# Patient Record
Sex: Female | Born: 1955 | Race: Black or African American | Hispanic: No | Marital: Married | Smoking: Former smoker
Health system: Southern US, Community
[De-identification: ages and names within clinical notes are randomized; demographics above are authoritative.]

## PROBLEM LIST (undated history)

## (undated) DIAGNOSIS — G0491 Myelitis, unspecified: Secondary | ICD-10-CM

## (undated) DIAGNOSIS — Z8489 Family history of other specified conditions: Secondary | ICD-10-CM

## (undated) DIAGNOSIS — C9000 Multiple myeloma not having achieved remission: Secondary | ICD-10-CM

## (undated) DIAGNOSIS — T4145XA Adverse effect of unspecified anesthetic, initial encounter: Secondary | ICD-10-CM

## (undated) DIAGNOSIS — G709 Myoneural disorder, unspecified: Secondary | ICD-10-CM

## (undated) DIAGNOSIS — Z9889 Other specified postprocedural states: Secondary | ICD-10-CM

## (undated) DIAGNOSIS — D472 Monoclonal gammopathy: Principal | ICD-10-CM

## (undated) DIAGNOSIS — K5900 Constipation, unspecified: Secondary | ICD-10-CM

## (undated) DIAGNOSIS — K589 Irritable bowel syndrome without diarrhea: Secondary | ICD-10-CM

## (undated) DIAGNOSIS — M129 Arthropathy, unspecified: Secondary | ICD-10-CM

## (undated) DIAGNOSIS — K909 Intestinal malabsorption, unspecified: Secondary | ICD-10-CM

## (undated) DIAGNOSIS — R112 Nausea with vomiting, unspecified: Secondary | ICD-10-CM

## (undated) DIAGNOSIS — D500 Iron deficiency anemia secondary to blood loss (chronic): Secondary | ICD-10-CM

## (undated) DIAGNOSIS — K219 Gastro-esophageal reflux disease without esophagitis: Secondary | ICD-10-CM

## (undated) DIAGNOSIS — M353 Polymyalgia rheumatica: Secondary | ICD-10-CM

## (undated) DIAGNOSIS — R011 Cardiac murmur, unspecified: Secondary | ICD-10-CM

## (undated) DIAGNOSIS — D219 Benign neoplasm of connective and other soft tissue, unspecified: Secondary | ICD-10-CM

## (undated) DIAGNOSIS — M1990 Unspecified osteoarthritis, unspecified site: Secondary | ICD-10-CM

## (undated) DIAGNOSIS — D649 Anemia, unspecified: Secondary | ICD-10-CM

## (undated) DIAGNOSIS — T8859XA Other complications of anesthesia, initial encounter: Secondary | ICD-10-CM

## (undated) HISTORY — PX: OTHER SURGICAL HISTORY: SHX169

## (undated) HISTORY — DX: Myoneural disorder, unspecified: G70.9

## (undated) HISTORY — DX: Benign neoplasm of connective and other soft tissue, unspecified: D21.9

## (undated) HISTORY — PX: KNEE ARTHROSCOPY: SUR90

## (undated) HISTORY — PX: COLONOSCOPY: SHX174

## (undated) HISTORY — DX: Intestinal malabsorption, unspecified: K90.9

## (undated) HISTORY — DX: Gastro-esophageal reflux disease without esophagitis: K21.9

## (undated) HISTORY — DX: Monoclonal gammopathy: D47.2

## (undated) HISTORY — DX: Arthropathy, unspecified: M12.9

## (undated) HISTORY — DX: Irritable bowel syndrome, unspecified: K58.9

## (undated) HISTORY — DX: Anemia, unspecified: D64.9

## (undated) HISTORY — DX: Iron deficiency anemia secondary to blood loss (chronic): D50.0

## (undated) HISTORY — PX: UMBILICAL HERNIA REPAIR: SHX196

## (undated) HISTORY — PX: TUBAL LIGATION: SHX77

## (undated) HISTORY — DX: Multiple myeloma not having achieved remission: C90.00

## (undated) HISTORY — DX: Polymyalgia rheumatica: M35.3

## (undated) HISTORY — DX: Unspecified osteoarthritis, unspecified site: M19.90

## (undated) HISTORY — DX: Cardiac murmur, unspecified: R01.1

---

## 1999-03-30 ENCOUNTER — Other Ambulatory Visit: Admission: RE | Admit: 1999-03-30 | Discharge: 1999-03-30 | Payer: Self-pay | Admitting: Obstetrics and Gynecology

## 2000-06-01 ENCOUNTER — Other Ambulatory Visit: Admission: RE | Admit: 2000-06-01 | Discharge: 2000-06-01 | Payer: Self-pay | Admitting: Obstetrics and Gynecology

## 2001-09-28 ENCOUNTER — Encounter: Payer: Self-pay | Admitting: Family Medicine

## 2001-09-28 ENCOUNTER — Encounter: Admission: RE | Admit: 2001-09-28 | Discharge: 2001-09-28 | Payer: Self-pay | Admitting: Family Medicine

## 2001-12-27 ENCOUNTER — Other Ambulatory Visit: Admission: RE | Admit: 2001-12-27 | Discharge: 2001-12-27 | Payer: Self-pay | Admitting: Obstetrics and Gynecology

## 2002-09-20 ENCOUNTER — Encounter: Admission: RE | Admit: 2002-09-20 | Discharge: 2002-09-20 | Payer: Self-pay | Admitting: Family Medicine

## 2002-09-20 ENCOUNTER — Encounter: Payer: Self-pay | Admitting: Family Medicine

## 2003-04-09 ENCOUNTER — Other Ambulatory Visit: Admission: RE | Admit: 2003-04-09 | Discharge: 2003-04-09 | Payer: Self-pay | Admitting: Obstetrics and Gynecology

## 2004-02-01 ENCOUNTER — Emergency Department (HOSPITAL_COMMUNITY): Admission: EM | Admit: 2004-02-01 | Discharge: 2004-02-02 | Payer: Self-pay | Admitting: Emergency Medicine

## 2004-08-28 ENCOUNTER — Other Ambulatory Visit: Admission: RE | Admit: 2004-08-28 | Discharge: 2004-08-28 | Payer: Self-pay | Admitting: Obstetrics and Gynecology

## 2004-09-04 ENCOUNTER — Encounter: Admission: RE | Admit: 2004-09-04 | Discharge: 2004-09-04 | Payer: Self-pay | Admitting: Family Medicine

## 2004-10-30 ENCOUNTER — Encounter: Admission: RE | Admit: 2004-10-30 | Discharge: 2004-10-30 | Payer: Self-pay | Admitting: Neurology

## 2004-12-01 ENCOUNTER — Encounter: Admission: RE | Admit: 2004-12-01 | Discharge: 2004-12-01 | Payer: Self-pay | Admitting: Family Medicine

## 2004-12-18 ENCOUNTER — Encounter: Admission: RE | Admit: 2004-12-18 | Discharge: 2004-12-18 | Payer: Self-pay | Admitting: Neurology

## 2005-01-14 ENCOUNTER — Ambulatory Visit: Payer: Self-pay | Admitting: Hematology & Oncology

## 2005-02-02 ENCOUNTER — Ambulatory Visit: Payer: Self-pay | Admitting: Hematology & Oncology

## 2005-02-02 ENCOUNTER — Ambulatory Visit (HOSPITAL_COMMUNITY): Admission: RE | Admit: 2005-02-02 | Discharge: 2005-02-02 | Payer: Self-pay | Admitting: Hematology & Oncology

## 2005-02-02 ENCOUNTER — Encounter (INDEPENDENT_AMBULATORY_CARE_PROVIDER_SITE_OTHER): Payer: Self-pay | Admitting: *Deleted

## 2005-04-15 ENCOUNTER — Ambulatory Visit: Payer: Self-pay | Admitting: Hematology & Oncology

## 2005-06-05 ENCOUNTER — Emergency Department (HOSPITAL_COMMUNITY): Admission: EM | Admit: 2005-06-05 | Discharge: 2005-06-05 | Payer: Self-pay | Admitting: Emergency Medicine

## 2005-06-09 ENCOUNTER — Encounter: Admission: RE | Admit: 2005-06-09 | Discharge: 2005-06-09 | Payer: Self-pay | Admitting: Family Medicine

## 2005-10-07 ENCOUNTER — Ambulatory Visit: Payer: Self-pay | Admitting: Gastroenterology

## 2005-10-22 ENCOUNTER — Ambulatory Visit: Payer: Self-pay | Admitting: Hematology & Oncology

## 2005-10-26 LAB — CBC WITH DIFFERENTIAL/PLATELET
Eosinophils Absolute: 0.2 10*3/uL (ref 0.0–0.5)
MCV: 88.7 fL (ref 81.0–101.0)
MONO%: 9.2 % (ref 0.0–13.0)
NEUT#: 2.5 10*3/uL (ref 1.5–6.5)
RBC: 3.92 10*6/uL (ref 3.70–5.32)
RDW: 13.5 % (ref 11.3–14.5)
WBC: 5.8 10*3/uL (ref 3.9–10.0)
lymph#: 2.6 10*3/uL (ref 0.9–3.3)

## 2005-10-27 LAB — PROTEIN ELECTROPHORESIS, SERUM
Albumin ELP: 52.1 % — ABNORMAL LOW (ref 55.8–66.1)
Alpha-1-Globulin: 3.4 % (ref 2.9–4.9)
Beta Globulin: 4.4 % — ABNORMAL LOW (ref 4.7–7.2)
Total Protein, Serum Electrophoresis: 8.4 g/dL — ABNORMAL HIGH (ref 6.0–8.3)

## 2005-11-30 ENCOUNTER — Ambulatory Visit: Payer: Self-pay | Admitting: Gastroenterology

## 2006-01-28 ENCOUNTER — Other Ambulatory Visit: Admission: RE | Admit: 2006-01-28 | Discharge: 2006-01-28 | Payer: Self-pay | Admitting: Obstetrics and Gynecology

## 2006-06-27 ENCOUNTER — Ambulatory Visit: Payer: Self-pay | Admitting: Gastroenterology

## 2006-06-28 ENCOUNTER — Encounter: Admission: RE | Admit: 2006-06-28 | Discharge: 2006-06-28 | Payer: Self-pay | Admitting: Physician Assistant

## 2007-02-24 ENCOUNTER — Other Ambulatory Visit: Admission: RE | Admit: 2007-02-24 | Discharge: 2007-02-24 | Payer: Self-pay | Admitting: Obstetrics and Gynecology

## 2007-03-01 ENCOUNTER — Encounter: Admission: RE | Admit: 2007-03-01 | Discharge: 2007-03-01 | Payer: Self-pay | Admitting: Emergency Medicine

## 2008-02-21 ENCOUNTER — Encounter: Admission: RE | Admit: 2008-02-21 | Discharge: 2008-02-21 | Payer: Self-pay | Admitting: Emergency Medicine

## 2008-02-29 ENCOUNTER — Encounter: Admission: RE | Admit: 2008-02-29 | Discharge: 2008-02-29 | Payer: Self-pay | Admitting: Emergency Medicine

## 2008-03-11 ENCOUNTER — Encounter: Payer: Self-pay | Admitting: Obstetrics and Gynecology

## 2008-03-11 ENCOUNTER — Ambulatory Visit: Payer: Self-pay | Admitting: Obstetrics and Gynecology

## 2008-03-11 ENCOUNTER — Other Ambulatory Visit: Admission: RE | Admit: 2008-03-11 | Discharge: 2008-03-11 | Payer: Self-pay | Admitting: Obstetrics and Gynecology

## 2008-03-21 ENCOUNTER — Encounter: Admission: RE | Admit: 2008-03-21 | Discharge: 2008-03-21 | Payer: Self-pay | Admitting: Emergency Medicine

## 2008-04-09 DIAGNOSIS — Z8719 Personal history of other diseases of the digestive system: Secondary | ICD-10-CM | POA: Insufficient documentation

## 2008-04-09 DIAGNOSIS — Z9189 Other specified personal risk factors, not elsewhere classified: Secondary | ICD-10-CM | POA: Insufficient documentation

## 2008-04-09 DIAGNOSIS — M129 Arthropathy, unspecified: Secondary | ICD-10-CM | POA: Insufficient documentation

## 2008-04-10 ENCOUNTER — Ambulatory Visit: Payer: Self-pay | Admitting: Gastroenterology

## 2009-05-10 DIAGNOSIS — G0491 Myelitis, unspecified: Secondary | ICD-10-CM

## 2009-05-10 HISTORY — PX: NECK SURGERY: SHX720

## 2009-05-10 HISTORY — DX: Myelitis, unspecified: G04.91

## 2009-05-23 ENCOUNTER — Ambulatory Visit: Payer: Self-pay | Admitting: Obstetrics and Gynecology

## 2009-05-23 ENCOUNTER — Other Ambulatory Visit: Admission: RE | Admit: 2009-05-23 | Discharge: 2009-05-23 | Payer: Self-pay | Admitting: Obstetrics and Gynecology

## 2009-06-13 ENCOUNTER — Ambulatory Visit: Payer: Self-pay | Admitting: Obstetrics and Gynecology

## 2009-09-24 ENCOUNTER — Ambulatory Visit: Payer: Self-pay | Admitting: Hematology & Oncology

## 2009-10-02 ENCOUNTER — Ambulatory Visit: Payer: Self-pay | Admitting: Diagnostic Radiology

## 2009-10-02 ENCOUNTER — Ambulatory Visit (HOSPITAL_BASED_OUTPATIENT_CLINIC_OR_DEPARTMENT_OTHER): Admission: RE | Admit: 2009-10-02 | Discharge: 2009-10-02 | Payer: Self-pay | Admitting: Hematology & Oncology

## 2009-10-02 LAB — CBC WITH DIFFERENTIAL (CANCER CENTER ONLY)
BASO#: 0 10*3/uL (ref 0.0–0.2)
BASO%: 0.4 % (ref 0.0–2.0)
MCHC: 33.2 g/dL (ref 32.0–36.0)
MCV: 89 fL (ref 81–101)
MONO#: 0.2 10*3/uL (ref 0.1–0.9)
NEUT#: 2.3 10*3/uL (ref 1.5–6.5)
Platelets: 190 10*3/uL (ref 145–400)

## 2009-10-07 LAB — COMPREHENSIVE METABOLIC PANEL
ALT: 15 U/L (ref 0–35)
AST: 17 U/L (ref 0–37)
Alkaline Phosphatase: 42 U/L (ref 39–117)
CO2: 24 mEq/L (ref 19–32)
Calcium: 9.3 mg/dL (ref 8.4–10.5)
Glucose, Bld: 91 mg/dL (ref 70–99)
Sodium: 136 mEq/L (ref 135–145)
Total Bilirubin: 0.4 mg/dL (ref 0.3–1.2)
Total Protein: 8 g/dL (ref 6.0–8.3)

## 2009-10-07 LAB — SPEP & IFE WITH QIG
Albumin ELP: 49.9 % — ABNORMAL LOW (ref 55.8–66.1)
Alpha-1-Globulin: 3.7 % (ref 2.9–4.9)
Alpha-2-Globulin: 8.4 % (ref 7.1–11.8)
Beta Globulin: 4.3 % — ABNORMAL LOW (ref 4.7–7.2)
IgA: 32 mg/dL — ABNORMAL LOW (ref 68–378)
IgM, Serum: 25 mg/dL — ABNORMAL LOW (ref 60–263)
Total Protein, Serum Electrophoresis: 8 g/dL (ref 6.0–8.3)

## 2009-10-07 LAB — RETICULOCYTES (CHCC)
ABS Retic: 15 10*3/uL — ABNORMAL LOW (ref 19.0–186.0)
Retic Ct Pct: 0.4 % (ref 0.4–3.1)

## 2009-10-07 LAB — LACTATE DEHYDROGENASE: LDH: 171 U/L (ref 94–250)

## 2009-10-07 LAB — KAPPA/LAMBDA LIGHT CHAINS: Kappa:Lambda Ratio: 4.31 — ABNORMAL HIGH (ref 0.26–1.65)

## 2009-10-09 LAB — UIFE/LIGHT CHAINS/TP QN, 24-HR UR
Alpha 2, Urine: DETECTED — AB
Beta, Urine: DETECTED — AB
Free Kappa Lt Chains,Ur: 0.8 mg/dL (ref 0.04–1.51)
Free Lt Chn Excr Rate: 7.2 mg/d
Total Protein, Urine: 1.3 mg/dL

## 2009-12-16 ENCOUNTER — Ambulatory Visit: Payer: Self-pay | Admitting: Gastroenterology

## 2009-12-16 DIAGNOSIS — R1032 Left lower quadrant pain: Secondary | ICD-10-CM | POA: Insufficient documentation

## 2010-02-05 ENCOUNTER — Ambulatory Visit: Payer: Self-pay | Admitting: Hematology & Oncology

## 2010-02-06 LAB — CBC WITH DIFFERENTIAL (CANCER CENTER ONLY)
BASO#: 0 10*3/uL (ref 0.0–0.2)
BASO%: 0.3 % (ref 0.0–2.0)
EOS%: 1.5 % (ref 0.0–7.0)
HGB: 11.3 g/dL — ABNORMAL LOW (ref 11.6–15.9)
MCH: 29.4 pg (ref 26.0–34.0)
MCHC: 32.9 g/dL (ref 32.0–36.0)
MCV: 89 fL (ref 81–101)
MONO#: 0.3 10*3/uL (ref 0.1–0.9)
Platelets: 179 10*3/uL (ref 145–400)

## 2010-02-06 LAB — CHCC SATELLITE - SMEAR

## 2010-02-12 LAB — COMPREHENSIVE METABOLIC PANEL
Albumin: 3.8 g/dL (ref 3.5–5.2)
CO2: 28 mEq/L (ref 19–32)
Creatinine, Ser: 1 mg/dL (ref 0.40–1.20)
Potassium: 3.7 mEq/L (ref 3.5–5.3)
Sodium: 139 mEq/L (ref 135–145)
Total Protein: 7.9 g/dL (ref 6.0–8.3)

## 2010-02-12 LAB — PROTEIN ELECTROPHORESIS, SERUM
Beta 2: 2.9 % — ABNORMAL LOW (ref 3.2–6.5)
Beta Globulin: 4.7 % (ref 4.7–7.2)
Gamma Globulin: 29.7 % — ABNORMAL HIGH (ref 11.1–18.8)

## 2010-02-12 LAB — KAPPA/LAMBDA LIGHT CHAINS
Kappa:Lambda Ratio: 5.46 — ABNORMAL HIGH (ref 0.26–1.65)
Lambda Free Lght Chn: 0.5 mg/dL — ABNORMAL LOW (ref 0.57–2.63)

## 2010-04-10 ENCOUNTER — Encounter: Admission: RE | Admit: 2010-04-10 | Discharge: 2010-04-10 | Payer: Self-pay | Admitting: Orthopedic Surgery

## 2010-05-31 ENCOUNTER — Encounter: Payer: Self-pay | Admitting: Neurology

## 2010-06-08 ENCOUNTER — Ambulatory Visit (HOSPITAL_BASED_OUTPATIENT_CLINIC_OR_DEPARTMENT_OTHER): Payer: 59 | Admitting: Hematology & Oncology

## 2010-06-11 NOTE — Assessment & Plan Note (Signed)
Summary: more frequent bm's...as.   History of Present Illness Visit Type: Mrs.  Primary GI MD: Melvia Heaps MD [facility] Primary Provider: Roque Lias, MD Requesting Provider: na Chief Complaint: More frequent BMs, and pain and hardness on left side  History of Present Illness:   Barbara Cook has returned complaining of lower abdominal discomfort. Preceding a bowel movement she develops tightness in the left lower quadrant.  Her bowels are initially incomplete.  Following the bowel movement she has persistent discomfort in the left lower quadrant.  She took a fiber in the past but did not tolerate it.  She denies melena or hematochezia.  Colonoscopy in 2007 was normal.   GI Review of Systems    Reports abdominal pain.     Location of  Abdominal pain: left side.    Denies acid reflux, belching, bloating, chest pain, dysphagia with liquids, dysphagia with solids, heartburn, loss of appetite, nausea, vomiting, vomiting blood, weight loss, and  weight gain.        Denies anal fissure, black tarry stools, change in bowel habit, constipation, diarrhea, diverticulosis, fecal incontinence, heme positive stool, hemorrhoids, irritable bowel syndrome, jaundice, light color stool, liver problems, rectal bleeding, and  rectal pain.    Current Medications (verified): 1)  Meloxicam 15 Mg Tabs (Meloxicam) .... As Needed 2)  Torsemide 20 Mg Tabs (Torsemide) .... One Tablet By Mouth Once Daily 3)  Vitamin D 1000 Unit Tabs (Cholecalciferol) .... Four Tablets By Mouth Once Daily 4)  Omega-3 350 Mg Caps (Omega-3 Fatty Acids) .... One Capsule By Mouth Two Times A Day 5)  Pepcid Ac Maximum Strength 20 Mg Tabs (Famotidine) .... As Needed 6)  Tylenol Arthritis Pain 650 Mg Cr-Tabs (Acetaminophen) .... Two Tablets By Mouth Once Daily  Allergies (verified): 1)  ! Codeine  Past History:  Past Medical History: Reviewed history from 04/09/2008 and no changes required. Current Problems:  ABDOMINAL PAIN,  HX OF (ICD-V15.89) CONSTIPATION, HX OF (ICD-V12.79) ARTHRITIS (ICD-716.90)  Past Surgical History: Reviewed history from 04/09/2008 and no changes required. Hernia Surgery Tubal Ligation  Family History: Lung Cancer: Uncle Throat Cancer: Aunt Family History of Diabetes: Maternal Grandmother Family History of Heart Disease: Paternal Grandmother No FH of Colon Cancer:  Social History: [employer] Patient is a former smoker.  Alcohol Use - no Illicit Drug Use - no Patient gets regular exercise.  Review of Systems       The patient complains of allergy/sinus, arthritis/joint pain, headaches-new, muscle pains/cramps, night sweats, and swelling of feet/legs.  The patient denies anemia, anxiety-new, back pain, blood in urine, breast changes/lumps, change in vision, confusion, cough, coughing up blood, depression-new, fainting, fatigue, fever, hearing problems, heart murmur, heart rhythm changes, itching, menstrual pain, nosebleeds, pregnancy symptoms, shortness of breath, skin rash, sleeping problems, sore throat, swollen lymph glands, thirst - excessive, urination - excessive, urination changes/pain, urine leakage, vision changes, and voice change.         All other systems were reviewed and were negative   Vital Signs:  Patient profile:   55 year old female Height:      64 inches Weight:      191 pounds BMI:     32.90 BSA:     1.92 Pulse rate:   88 / minute Pulse rhythm:   regular BP sitting:   124 / 76  (left arm) Cuff size:   regular  Vitals Entered By: Ok Anis CMA (December 16, 2009 8:10 AM)  Physical Exam  Additional Exam:  On physical exam she is a well-developed well-nourished female  skin: anicteric HEENT: normocephalic; PEERLA; no nasal or pharyngeal abnormalities neck: supple nodes: no cervical lymphadenopathy chest: clear to ausculatation and percussion heart: no murmurs, gallops, or rubs abd: soft, nontender; BS normoactive; no abdominal masses,  tenderness, organomegaly rectal: deferred ext: no cynanosis, clubbing; there is 1+ ankle edema skeletal: no deformities neuro: oriented x 3; no focal abnormalities    Impression & Recommendations:  Problem # 1:  ABDOMINAL PAIN, LEFT LOWER QUADRANT (ICD-789.04) Pain is likely related to mild constipation and is due to spasm.  Recommendations #1 fiber supplementation #2 hyomax p.r.n.  Patient Instructions: 1)  Copy sent to : Roque Lias, MD 2)  You can pick up your new prescription from your pharmacy today 3)  The medication list was reviewed and reconciled.  All changed / newly prescribed medications were explained.  A complete medication list was provided to the patient / caregiver. Prescriptions: HYOMAX-SL 0.125 MG SUBL (HYOSCYAMINE SULFATE) take 2 tablets sublingual q.4 H.  #15 x 2   Entered and Authorized by:   Louis Meckel MD   Signed by:   Louis Meckel MD on 12/16/2009   Method used:   Electronically to        [facility] [address] [phone]* (retail)       [address]       [place], [place]  [address]       Ph: [phone] or [phone]       Fax: [phone]   RxID:   [id number]

## 2010-06-12 ENCOUNTER — Encounter (HOSPITAL_BASED_OUTPATIENT_CLINIC_OR_DEPARTMENT_OTHER): Payer: 59 | Admitting: Hematology & Oncology

## 2010-06-12 DIAGNOSIS — D472 Monoclonal gammopathy: Secondary | ICD-10-CM

## 2010-06-12 DIAGNOSIS — D649 Anemia, unspecified: Secondary | ICD-10-CM

## 2010-06-12 LAB — CBC WITH DIFFERENTIAL (CANCER CENTER ONLY)
BASO%: 0.4 % (ref 0.0–2.0)
EOS%: 1.7 % (ref 0.0–7.0)
HCT: 32.2 % — ABNORMAL LOW (ref 34.8–46.6)
HGB: 10.5 g/dL — ABNORMAL LOW (ref 11.6–15.9)
LYMPH%: 42.1 % (ref 14.0–48.0)
MCH: 28.6 pg (ref 26.0–34.0)
MCHC: 32.5 g/dL (ref 32.0–36.0)
MCV: 88 fL (ref 81–101)
MONO#: 0.4 10*3/uL (ref 0.1–0.9)
NEUT%: 48.3 % (ref 39.6–80.0)

## 2010-06-16 LAB — SPEP & IFE WITH QIG
Albumin ELP: 48.6 % — ABNORMAL LOW (ref 55.8–66.1)
Beta 2: 3.2 % (ref 3.2–6.5)
IgA: 47 mg/dL — ABNORMAL LOW (ref 68–378)
IgG (Immunoglobin G), Serum: 3490 mg/dL — ABNORMAL HIGH (ref 694–1618)
IgM, Serum: 26 mg/dL — ABNORMAL LOW (ref 60–263)
Total Protein, Serum Electrophoresis: 8.8 g/dL — ABNORMAL HIGH (ref 6.0–8.3)

## 2010-06-16 LAB — COMPREHENSIVE METABOLIC PANEL
AST: 25 U/L (ref 0–37)
Albumin: 4.1 g/dL (ref 3.5–5.2)
Alkaline Phosphatase: 49 U/L (ref 39–117)
BUN: 26 mg/dL — ABNORMAL HIGH (ref 6–23)
CO2: 23 mEq/L (ref 19–32)
Sodium: 135 mEq/L (ref 135–145)

## 2010-06-16 LAB — KAPPA/LAMBDA LIGHT CHAINS: Kappa:Lambda Ratio: 3.79 — ABNORMAL HIGH (ref 0.26–1.65)

## 2010-06-24 ENCOUNTER — Other Ambulatory Visit: Payer: Self-pay | Admitting: Orthopedic Surgery

## 2010-06-24 DIAGNOSIS — M542 Cervicalgia: Secondary | ICD-10-CM

## 2010-07-13 ENCOUNTER — Ambulatory Visit
Admission: RE | Admit: 2010-07-13 | Discharge: 2010-07-13 | Disposition: A | Discharge status: Home / Self Care | Payer: 59 | Source: Ambulatory Visit | Attending: Orthopedic Surgery | Admitting: Orthopedic Surgery

## 2010-07-13 DIAGNOSIS — M542 Cervicalgia: Secondary | ICD-10-CM

## 2010-08-17 ENCOUNTER — Encounter (HOSPITAL_BASED_OUTPATIENT_CLINIC_OR_DEPARTMENT_OTHER): Payer: 59 | Admitting: Hematology & Oncology

## 2010-08-17 ENCOUNTER — Other Ambulatory Visit: Payer: Self-pay | Admitting: Hematology & Oncology

## 2010-08-17 DIAGNOSIS — D472 Monoclonal gammopathy: Secondary | ICD-10-CM

## 2010-08-17 LAB — CBC WITH DIFFERENTIAL (CANCER CENTER ONLY)
BASO#: 0 10*3/uL (ref 0.0–0.2)
EOS%: 0.1 % (ref 0.0–7.0)
HCT: 34.7 % — ABNORMAL LOW (ref 34.8–46.6)
HGB: 11.8 g/dL (ref 11.6–15.9)
MCH: 28.4 pg (ref 26.0–34.0)
MCHC: 34 g/dL (ref 32.0–36.0)
MONO%: 5.3 % (ref 0.0–13.0)
NEUT%: 79.1 % (ref 39.6–80.0)

## 2010-08-19 LAB — SPEP & IFE WITH QIG
Alpha-2-Globulin: 8.9 % (ref 7.1–11.8)
IgG (Immunoglobin G), Serum: 3550 mg/dL — ABNORMAL HIGH (ref 694–1618)
IgM, Serum: 28 mg/dL — ABNORMAL LOW (ref 60–263)
M-Spike, %: 2.4 g/dL
Total Protein, Serum Electrophoresis: 8.6 g/dL — ABNORMAL HIGH (ref 6.0–8.3)

## 2010-08-19 LAB — COMPREHENSIVE METABOLIC PANEL
Albumin: 3.9 g/dL (ref 3.5–5.2)
Alkaline Phosphatase: 46 U/L (ref 39–117)
BUN: 22 mg/dL (ref 6–23)
Glucose, Bld: 123 mg/dL — ABNORMAL HIGH (ref 70–99)
Total Bilirubin: 0.6 mg/dL (ref 0.3–1.2)

## 2010-08-19 LAB — KAPPA/LAMBDA LIGHT CHAINS: Kappa free light chain: 2.35 mg/dL — ABNORMAL HIGH (ref 0.33–1.94)

## 2010-08-19 LAB — IRON AND TIBC
Iron: 117 ug/dL (ref 42–145)
UIBC: 229 ug/dL

## 2010-09-25 NOTE — Op Note (Signed)
NAMESTACY, SAILER NO.:  [id number]   MEDICAL RECORD NO.:  [id number]          PATIENT TYPE:  OUT   LOCATION:  [facility]                         FACILITY:  [facility]   PHYSICIAN:  Rose Phi. Myna Hidalgo, M.D. DATE OF BIRTH:  Jan 03, 1956   DATE OF PROCEDURE:  02/02/2005  DATE OF DISCHARGE:                                 OPERATIVE REPORT   NATURE OF PROCEDURE:  Left posterior iliac crest bone marrow biopsy and  aspirate.   Ms. Gallen was brought to the short stay unit.  She had an IV placed in her  right hand.  She received a total of 8 mg of Versed and 50 mg of Demerol for  IV sedation.  She was placed onto her right side.  Her left posterior iliac  crest region was prepped and draped in a sterile fashion.  Then 10 cc of  lidocaine was infiltrated into the skin down the periosteum.  We had to use  a 22 gauge spinal needle to reach the periosteum.   A midline scalpel was used to make an incision into the skin.  Two bone  marrow aspirates were obtained without difficulty.   A second incision was made into the skin.  A bone marrow biopsy core was  obtained.  There were no calcifications.      Rose Phi. Myna Hidalgo, M.D.  Electronically Signed     PRE/MEDQ  D:  02/02/2005  T:  02/02/2005  Job:  [id number]

## 2010-09-25 NOTE — Assessment & Plan Note (Signed)
[facility]                           GASTROENTEROLOGY ON-CALL NOTE   NAME:Barbara Cook, Barbara Cook                         MRN:          [id number]  DATE:11/30/2005                            DOB:          02/07/1956    ON-CALL PHONE NOTE:  [name] called tonight at 6:15.  She had a  colonoscopy with Dr. Arlyce Dice that went very well.  She has been having gas  discomforts today that are very common for her.  She has these whenever she  is without food for an extended period of time and they are mild abdominal  cramping discomfort.  She said her discomforts today are no worse than her  usual, which will occur once every week or 2.  Her question was whether she  can take her normal Gas-X or Pepcid AC that she normally takes for this  discomfort; she is just concerned because she had the procedure today and  was wondering whether that was okay.  I explained that that would be fine.  She has had no fevers, no chills, no significant abdominal pain and when she  pushes on her abdomen, she is not tender.  I do not think she has had a  complication from the colonoscopy; rather, this is simply her usual gas  discomfort, so she will go ahead and take her usual medicines.  I told her  that the Gas-X was probably better than Pepcid AC for this discomfort.  She  knows to call me if she has any further problems or questions tonight.                                   Rachael Fee, MD   DPJ/MedQ  DD:  11/30/2005  DT:  12/01/2005  Job #:  [id number]   cc:   Barbette Hair. Arlyce Dice, MD, [facility]

## 2010-09-25 NOTE — Assessment & Plan Note (Signed)
[facility]                         GASTROENTEROLOGY OFFICE NOTE   NAME:Choyce, Chaselyn H                       MRN:          [id number]  DATE:Feb 06, 202008                            DOB:          29-Dec-1955    PROBLEM:  Abdominal pain.   REASON:  Ms. Barbara Cook has returned complaining of abdominal pain.  Over  the last 2 months she has been complaining of intermittent left upper  quadrant squeezing or pressure-like pain.  It may be partially relieved  with a bowel movement.  She moves her bowels almost daily, though at  times she has constipation.  This may occur at least once a week.  She  denies nausea, fever, dysuria or urinary frequency.  Screening  colonoscopy in July, 2007, was normal.  She denies nausea.  She has very  mild intermittent pyrosis.   She is on no regular medications.   PHYSICAL EXAMINATION:  Pulse 60, blood pressure 138/80, weight 196.  HEENT:  EOMI. PERRLA. Sclerae are anicteric.  Conjunctivae are pink.  NECK:  Supple without thyromegaly, adenopathy or carotid bruits.  CHEST:  Clear to auscultation and percussion without adventitious  sounds.  CARDIAC:  Regular rhythm; normal S1 S2.  There are no murmurs, gallops  or rubs.  ABDOMEN:  Bowel sounds are normoactive.  Abdomen is soft, non-tender and  non-distended.  There are no abdominal masses, tenderness, splenic  enlargement or hepatomegaly.  EXTREMITIES:  Full range of motion.  No cyanosis, clubbing or edema.  RECTAL:  Deferred.   IMPRESSION:  Nonspecific abdominal pain.  This could be related to mild  constipation.   RECOMMENDATION:  1. Begin fiber supplementation daily.  2. NuLev 0.25 mg sublingual q.4h. p.r.n.     Barbette Hair. Arlyce Dice, MD,FACG  Electronically Signed    RDK/MedQ  DD: Feb 06, 202008  DT: Feb 06, 202008  Job #: [id number]   cc:   Reuel Boom L. Eda Paschal, M.D.  Elsworth Soho, M.D.

## 2010-11-10 ENCOUNTER — Encounter: Payer: Self-pay | Admitting: Gastroenterology

## 2010-11-10 ENCOUNTER — Ambulatory Visit (INDEPENDENT_AMBULATORY_CARE_PROVIDER_SITE_OTHER): Payer: 59 | Admitting: Gastroenterology

## 2010-11-10 VITALS — BP 132/68 | HR 88 | Ht 64.0 in | Wt 207.0 lb

## 2010-11-10 DIAGNOSIS — R1012 Left upper quadrant pain: Secondary | ICD-10-CM

## 2010-11-10 MED ORDER — HYOSCYAMINE SULFATE 0.125 MG SL SUBL: 0.2500 mg | SUBLINGUAL_TABLET | Freq: Four times a day (QID) | SUBLINGUAL | Status: DC | PRN

## 2010-11-10 NOTE — Patient Instructions (Signed)
We are sending in a new prescription to your pharmacy

## 2010-11-10 NOTE — Assessment & Plan Note (Addendum)
She has very vague nonspecific discomfort.  Recommendations #1 follow hyomax 0.25 mg sublingual when necessary

## 2010-11-10 NOTE — Progress Notes (Signed)
History of Present Illness:  Mrs. Lipsky has returned for evaluation of upper abdominal pain. Her prior complaints of lower abdominal pain has subsided. Stools are still somewhat irregular but definitely improved since taking fiber supplements. She describes a poorly defined discomfort in the left upper quadrant that may radiate across her abdomen. It is unrelated to meals or bowel movements. It is of very mild intensity. She denies nausea or pyrosis.    Review of Systems: Pertinent positive and negative review of systems were noted in the above HPI section. All other review of systems were otherwise negative.    Current Medications, Allergies, Past Medical History, Past Surgical History, Family History and Social History were reviewed in [employer] electronic medical record  Vital signs were reviewed in today's medical record. Physical Exam: General: Well developed , well nourished, no acute distress Head: Normocephalic and atraumatic Eyes:  sclerae anicteric, EOMI Ears: Normal auditory acuity Mouth: No deformity or lesions Lungs: Clear throughout to auscultation Heart: Regular rate and rhythm; no murmurs, rubs or bruits Abdomen: Soft, non tender and non distended. No masses, hepatosplenomegaly or hernias noted. Normal Bowel sounds Rectal:deferred Musculoskeletal: Symmetrical with no gross deformities  Pulses:  Normal pulses noted Extremities: No clubbing, cyanosis  or deformities noted; there is trace ankle edema Neurological: Alert oriented x 4, grossly nonfocal Psychological:  Alert and cooperative. Normal mood and affect

## 2010-12-25 ENCOUNTER — Other Ambulatory Visit: Payer: Self-pay | Admitting: Hematology & Oncology

## 2010-12-25 DIAGNOSIS — D649 Anemia, unspecified: Secondary | ICD-10-CM

## 2010-12-25 LAB — CBC WITH DIFFERENTIAL (CANCER CENTER ONLY)
BASO%: 0.2 % (ref 0.0–2.0)
EOS%: 0.5 % (ref 0.0–7.0)
HCT: 35.9 % (ref 34.8–46.6)
LYMPH#: 1.6 10*3/uL (ref 0.9–3.3)
MCHC: 34.8 g/dL (ref 32.0–36.0)
MONO#: 0.4 10*3/uL (ref 0.1–0.9)
NEUT#: 4.1 10*3/uL (ref 1.5–6.5)
Platelets: 188 10*3/uL (ref 145–400)
RDW: 13.1 % (ref 11.1–15.7)
WBC: 6.2 10*3/uL (ref 3.9–10.0)

## 2010-12-29 ENCOUNTER — Encounter: Payer: Self-pay | Admitting: *Deleted

## 2010-12-29 LAB — SPEP & IFE WITH QIG
Alpha-2-Globulin: 9.1 % (ref 7.1–11.8)
Gamma Globulin: 31.2 % — ABNORMAL HIGH (ref 11.1–18.8)
IgG (Immunoglobin G), Serum: 2600 mg/dL — ABNORMAL HIGH (ref 690–1700)
IgM, Serum: 18 mg/dL — ABNORMAL LOW (ref 52–322)
M-Spike, %: 2.3 g/dL
Total Protein, Serum Electrophoresis: 8.4 g/dL — ABNORMAL HIGH (ref 6.0–8.3)

## 2010-12-29 LAB — COMPREHENSIVE METABOLIC PANEL
ALT: 22 U/L (ref 0–35)
AST: 24 U/L (ref 0–37)
CO2: 25 mEq/L (ref 19–32)
Chloride: 99 mEq/L (ref 96–112)
Sodium: 137 mEq/L (ref 135–145)
Total Bilirubin: 0.3 mg/dL (ref 0.3–1.2)
Total Protein: 8.4 g/dL — ABNORMAL HIGH (ref 6.0–8.3)

## 2010-12-29 LAB — LACTATE DEHYDROGENASE: LDH: 218 U/L (ref 94–250)

## 2010-12-29 LAB — KAPPA/LAMBDA LIGHT CHAINS: Kappa:Lambda Ratio: 7.31 — ABNORMAL HIGH (ref 0.26–1.65)

## 2010-12-29 NOTE — Telephone Encounter (Signed)
Opened in error

## 2011-03-04 ENCOUNTER — Encounter: Payer: Self-pay | Admitting: Obstetrics and Gynecology

## 2011-04-07 ENCOUNTER — Ambulatory Visit: Payer: 59 | Attending: Internal Medicine | Admitting: Physical Therapy

## 2011-04-07 DIAGNOSIS — M25559 Pain in unspecified hip: Secondary | ICD-10-CM | POA: Insufficient documentation

## 2011-04-07 DIAGNOSIS — IMO0001 Reserved for inherently not codable concepts without codable children: Secondary | ICD-10-CM | POA: Insufficient documentation

## 2011-04-07 DIAGNOSIS — M25659 Stiffness of unspecified hip, not elsewhere classified: Secondary | ICD-10-CM | POA: Insufficient documentation

## 2011-04-19 ENCOUNTER — Encounter: Payer: 59 | Admitting: Rehabilitation

## 2011-04-20 ENCOUNTER — Ambulatory Visit: Payer: 59 | Attending: Internal Medicine | Admitting: Physical Therapy

## 2011-04-20 DIAGNOSIS — M25559 Pain in unspecified hip: Secondary | ICD-10-CM | POA: Insufficient documentation

## 2011-04-20 DIAGNOSIS — M25659 Stiffness of unspecified hip, not elsewhere classified: Secondary | ICD-10-CM | POA: Insufficient documentation

## 2011-04-20 DIAGNOSIS — IMO0001 Reserved for inherently not codable concepts without codable children: Secondary | ICD-10-CM | POA: Insufficient documentation

## 2011-04-21 ENCOUNTER — Ambulatory Visit: Payer: 59 | Admitting: Physical Therapy

## 2011-04-27 ENCOUNTER — Encounter: Payer: 59 | Admitting: Physical Therapy

## 2011-04-29 ENCOUNTER — Ambulatory Visit: Payer: 59 | Admitting: Physical Therapy

## 2011-05-05 ENCOUNTER — Ambulatory Visit: Payer: 59 | Admitting: Physical Therapy

## 2011-05-18 ENCOUNTER — Ambulatory Visit: Payer: 59 | Attending: Internal Medicine | Admitting: Physical Therapy

## 2011-05-18 DIAGNOSIS — M25659 Stiffness of unspecified hip, not elsewhere classified: Secondary | ICD-10-CM | POA: Insufficient documentation

## 2011-05-18 DIAGNOSIS — M25559 Pain in unspecified hip: Secondary | ICD-10-CM | POA: Insufficient documentation

## 2011-05-18 DIAGNOSIS — IMO0001 Reserved for inherently not codable concepts without codable children: Secondary | ICD-10-CM | POA: Insufficient documentation

## 2011-05-20 ENCOUNTER — Ambulatory Visit: Payer: 59 | Admitting: Physical Therapy

## 2011-05-21 ENCOUNTER — Telehealth: Payer: Self-pay | Admitting: Hematology & Oncology

## 2011-05-21 NOTE — Telephone Encounter (Signed)
Pt called and cx 05/24/11 apt and resch for 06/28/11

## 2011-05-24 ENCOUNTER — Other Ambulatory Visit: Payer: 59 | Admitting: Lab

## 2011-05-24 ENCOUNTER — Ambulatory Visit: Payer: 59 | Admitting: Hematology & Oncology

## 2011-05-25 ENCOUNTER — Ambulatory Visit: Payer: 59 | Admitting: Physical Therapy

## 2011-05-27 ENCOUNTER — Ambulatory Visit: Payer: 59 | Admitting: Physical Therapy

## 2011-06-28 ENCOUNTER — Other Ambulatory Visit (HOSPITAL_BASED_OUTPATIENT_CLINIC_OR_DEPARTMENT_OTHER): Payer: 59 | Admitting: Lab

## 2011-06-28 ENCOUNTER — Ambulatory Visit (HOSPITAL_BASED_OUTPATIENT_CLINIC_OR_DEPARTMENT_OTHER): Payer: 59 | Admitting: Hematology & Oncology

## 2011-06-28 ENCOUNTER — Encounter: Payer: Self-pay | Admitting: Hematology & Oncology

## 2011-06-28 VITALS — BP 139/85 | HR 110 | Temp 97.0°F | Wt 212.0 lb

## 2011-06-28 DIAGNOSIS — D649 Anemia, unspecified: Secondary | ICD-10-CM

## 2011-06-28 DIAGNOSIS — D472 Monoclonal gammopathy: Secondary | ICD-10-CM

## 2011-06-28 HISTORY — DX: Monoclonal gammopathy: D47.2

## 2011-06-28 LAB — CBC WITH DIFFERENTIAL (CANCER CENTER ONLY)
BASO#: 0 10*3/uL (ref 0.0–0.2)
Eosinophils Absolute: 0 10*3/uL (ref 0.0–0.5)
HGB: 12.4 g/dL (ref 11.6–15.9)
LYMPH#: 1.5 10*3/uL (ref 0.9–3.3)
MCH: 29.9 pg (ref 26.0–34.0)
NEUT#: 5.3 10*3/uL (ref 1.5–6.5)
RBC: 4.15 10*6/uL (ref 3.70–5.32)

## 2011-06-28 NOTE — Progress Notes (Signed)
This office note has been dictated.

## 2011-06-29 NOTE — Progress Notes (Signed)
CC:   Reuben Likes, M.D.  DIAGNOSIS:  IgG kappa monoclonal gammopathy of undetermined significance.  CURRENT THERAPY:  Observation.  INTERIM HISTORY:  Ms. Biederman comes in for a 49-month followup.  She is doing well.  Since we last saw her, she has had no complaints.  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  She is working without any difficulties.  There is no fatigue or weakness.  There is no bony pain.  She has had no leg swelling.  There have been no rashes.  She has had no nausea or vomiting.  There have been no fevers, sweats or chills.  She did not take the flu shot this year because she does not trust what is in it.  When we last saw her, her monoclonal panel all looked better.  Her monoclonal spike was 2.3 g/dL.  Her serum IgG level was 2600 mg/dL.  Her serum kappa light chain was 5 mg/dL.  PHYSICAL EXAMINATION:  This is a well-developed, well-nourished black female in no obvious distress.  Vital signs:  Temperature 97, pulse 110, respiratory rate 18, blood pressure 139/85.  Weight is 220.  Head and neck:  Shows a normocephalic, atraumatic skull.  There are no ocular or oral lesions.  There are no palpable cervical or supraclavicular lymph nodes.  Lungs:  Clear to percussion and auscultation bilaterally. Cardiac:  Regular rate and rhythm with a normal S1 and S2.  There are no murmurs, rubs or bruits.  Abdomen:  Soft with good bowel sounds.  There is no palpable abdominal mass.  There is no palpable hepatosplenomegaly. Back:  No tenderness over the spine, ribs, or hips.  Extremities:  Show no clubbing, cyanosis or edema.  Neurologic:  Shows no focal neurological deficits.  LABORATORY STUDIES:  White cell count is 7.3, hemoglobin 12.4, hematocrit 37, platelet count 193.  IMPRESSION:  Ms. Hepp is a 56 year old African American female with an IgG kappa monoclonal gammopathy of undetermined significance.  She  is doing quite well with this.  I do not see any evidence of recurrence or progression of the monoclonal gammopathy.  I think that we can probably get her back in 6 more months.  We are rechecking her monoclonal panel today.  If, for some reason, we see marked change in the numbers, then we can certainly get her back sooner.  I have been following Ms. Moyano now since 2006.  So far, I have not seen any evidence of myelomatous not progression or transformation.  She did have a bone marrow biopsy done back in 2006.  This showed only 8% plasma cells.    ______________________________ Josph Macho, M.D. PRE/MEDQ  D:  06/28/2011  T:  06/29/2011  Job:  [id number]  ADDENDUM:  M-Spike is 2.12 g/dL. IgG is 3380 mg/dL.  KAPPA is 4.48 mg/dL.            Ferritin is 51.

## 2011-07-04 LAB — COMPREHENSIVE METABOLIC PANEL
ALT: 22 U/L (ref 0–35)
Albumin: 3.6 g/dL (ref 3.5–5.2)
CO2: 26 mEq/L (ref 19–32)
Calcium: 9.2 mg/dL (ref 8.4–10.5)
Chloride: 105 mEq/L (ref 96–112)
Sodium: 138 mEq/L (ref 135–145)
Total Protein: 7.9 g/dL (ref 6.0–8.3)

## 2011-07-04 LAB — SPEP & IFE WITH QIG
Alpha-2-Globulin: 9.5 % (ref 7.1–11.8)
Gamma Globulin: 30.6 % — ABNORMAL HIGH (ref 11.1–18.8)
IgA: 37 mg/dL — ABNORMAL LOW (ref 69–380)
IgG (Immunoglobin G), Serum: 3380 mg/dL — ABNORMAL HIGH (ref 690–1700)
IgM, Serum: 18 mg/dL — ABNORMAL LOW (ref 52–322)
M-Spike, %: 2.12 g/dL

## 2011-07-04 LAB — KAPPA/LAMBDA LIGHT CHAINS: Kappa:Lambda Ratio: 8.3 — ABNORMAL HIGH (ref 0.26–1.65)

## 2011-07-04 LAB — IRON AND TIBC: Iron: 85 ug/dL (ref 42–145)

## 2011-08-25 ENCOUNTER — Telehealth: Payer: Self-pay

## 2011-08-25 ENCOUNTER — Telehealth: Payer: Self-pay | Admitting: Gastroenterology

## 2011-08-25 NOTE — Telephone Encounter (Signed)
Pt scheduled to see Amy Esterwood tomorrow at 2:30pm. [name] to notify pt of appt date and time.

## 2011-08-25 NOTE — Telephone Encounter (Signed)
Pt cancelled appt for today and will call back Friday to try and reschedule for next week.

## 2011-08-26 ENCOUNTER — Ambulatory Visit: Payer: 59 | Admitting: Physician Assistant

## 2011-08-26 ENCOUNTER — Telehealth: Payer: Self-pay | Admitting: Gastroenterology

## 2011-08-26 NOTE — Telephone Encounter (Signed)
Pt called to reschedule her appt that she cancelled yesterday evening. Pt scheduled to see Willette Cluster NP 08/30/11@3 :30pm. Pt aware of appt date and time.

## 2011-08-30 ENCOUNTER — Ambulatory Visit (INDEPENDENT_AMBULATORY_CARE_PROVIDER_SITE_OTHER): Payer: 59 | Admitting: Nurse Practitioner

## 2011-08-30 ENCOUNTER — Encounter: Payer: Self-pay | Admitting: Nurse Practitioner

## 2011-08-30 ENCOUNTER — Other Ambulatory Visit (INDEPENDENT_AMBULATORY_CARE_PROVIDER_SITE_OTHER): Payer: 59

## 2011-08-30 VITALS — BP 110/84 | HR 90 | Ht 64.0 in | Wt 221.2 lb

## 2011-08-30 DIAGNOSIS — R198 Other specified symptoms and signs involving the digestive system and abdomen: Secondary | ICD-10-CM | POA: Insufficient documentation

## 2011-08-30 DIAGNOSIS — R1012 Left upper quadrant pain: Secondary | ICD-10-CM

## 2011-08-30 LAB — CBC WITH DIFFERENTIAL/PLATELET
Basophils Relative: 0.9 % (ref 0.0–3.0)
HCT: 37 % (ref 36.0–46.0)
Hemoglobin: 12 g/dL (ref 12.0–15.0)
Lymphocytes Relative: 36.3 % (ref 12.0–46.0)
Lymphs Abs: 2.6 10*3/uL (ref 0.7–4.0)
MCHC: 32.5 g/dL (ref 30.0–36.0)
Monocytes Relative: 8.9 % (ref 3.0–12.0)
Neutro Abs: 3.9 10*3/uL (ref 1.4–7.7)
RBC: 3.97 Mil/uL (ref 3.87–5.11)

## 2011-08-30 MED ORDER — SULINDAC 200 MG PO TABS: ORAL_TABLET | ORAL | Status: DC

## 2011-08-30 MED ORDER — METHOCARBAMOL 500 MG PO TABS: ORAL_TABLET | ORAL | Status: DC

## 2011-08-30 NOTE — Progress Notes (Signed)
Barbara Cook [id number] 1956/01/05   HISTORY OR PRESENT ILLNESS :  Patient is a 56 year old female known to Dr. Arlyce Dice for history of IBS type symptoms including intermittent abdominal pain and altered bowel habits. She had a normal  colonoscopy in July 2007. Her last visit here with Dr. Arlyce Dice was in July 2012. At that time she was evaluated for upper abdominal pain and altered bowel habits for which Levsin was prescribed.   Patient is here today for evaluation of left sided abdominal pain and intermittent loose stools which started about 2 weeks ago. She tried Levsin but it didn't help. Pain is exacerbated by twisting, it is usually relieved with defecation. She complains of post-prandial bloating as well. No fevers.  She takes Benefiber every other day, tried yogurt but caused loose stools. Currently her stools are just soft, not loose.   Current Medications, Allergies, Past Medical History, Past Surgical History, Family History and Social History were reviewed in [employer] record.   PHYSICAL EXAMINATION : General:  Well developed  female in no acute distress Head: Normocephalic and atraumatic Eyes:  sclerae anicteric,conjunctive pink. Ears: Normal auditory acuity Neck: Supple, no masses.  Lungs: Clear throughout to auscultation Heart: Regular rate and rhythm Abdomen: Soft, nondistended, nontender. No masses or hepatomegaly noted. Normal bowel sounds Rectal: not done Musculoskeletal: Symmetrical with no gross deformities  Skin: No lesions on visible extremities Extremities: No edema or deformities noted Neurological: Oriented, grossly nonfocal Cervical Nodes:  No significant cervical adenopathy Psychological:  Alert and cooperative. Normal mood and affect  ASSESSMENT AND PLAN :  1. Altered bowel habits, suspect IBS flare. Currently her stools are soft, not loose. Will give her a 14 day trial of Align.   2. Abdominal pain. Pain predominantly involves  left mid abdomen and left flank. It is worse with twisting and it improves with defecation. Pain doesn't clearly fit picture of IBS, there may be a component of musculoskeletal pain. Will give her a short trial of NSAIDS and muscle relaxer. She will take a PPI with the NSAID. She will follow up with Dr. Arlyce Dice in 4-6 weeks, or sooner if need be.

## 2011-08-30 NOTE — Patient Instructions (Addendum)
Please go to the basement level to have your labs drawn.  Take 1 Align probiotic capsule daily for 14 days.  Take 1 Prilosec 20 mg capsule 30 min before breakfast. Take 1 Clinoril tablet before breakfast. Take 1/2 tab of Robaxin before breakfast and 1/2 tab before bedtime.  Make an appointment to follow up with either Willette Cluster ACNP or Dr. Arlyce Dice in 3-4 weeks.

## 2011-09-01 ENCOUNTER — Encounter: Payer: Self-pay | Admitting: Nurse Practitioner

## 2011-09-02 NOTE — Progress Notes (Signed)
Agree with initial assessment and plans 

## 2011-09-15 NOTE — Progress Notes (Signed)
She should then keep her follow up appointment with Dr Arlyce Dice

## 2011-10-12 ENCOUNTER — Encounter: Payer: Self-pay | Admitting: Gastroenterology

## 2011-10-18 ENCOUNTER — Encounter: Payer: Self-pay | Admitting: Gastroenterology

## 2011-10-18 ENCOUNTER — Ambulatory Visit (INDEPENDENT_AMBULATORY_CARE_PROVIDER_SITE_OTHER): Payer: 59 | Admitting: Gastroenterology

## 2011-10-18 VITALS — BP 134/72 | HR 88 | Ht 64.0 in | Wt 219.0 lb

## 2011-10-18 DIAGNOSIS — R1012 Left upper quadrant pain: Secondary | ICD-10-CM

## 2011-10-18 DIAGNOSIS — K589 Irritable bowel syndrome without diarrhea: Secondary | ICD-10-CM

## 2011-10-18 NOTE — Assessment & Plan Note (Addendum)
Left upper quadrant pain is probably musculoskeletal in origin and has had a good response to clinoril. Plan to repeat Clinoril as needed

## 2011-10-18 NOTE — Progress Notes (Signed)
History of Present Illness:  Mrs. Patnaude returns for followup of abdominal pain and diarrhea. Both have subsided. She took Clinoril for a short period with resolution of her pain. Diarrhea has also subsided. She took align for 2 weeks. She has intermittent episodes of diarrhea alternating with constipation. She currently is symptom-free    Review of Systems: Pertinent positive and negative review of systems were noted in the above HPI section. All other review of systems were otherwise negative.    Current Medications, Allergies, Past Medical History, Past Surgical History, Family History and Social History were reviewed in [employer] electronic medical record  Vital signs were reviewed in today's medical record. Physical Exam: General: Well developed , well nourished, no acute distress

## 2011-10-18 NOTE — Patient Instructions (Signed)
Follow up as needed

## 2011-10-18 NOTE — Assessment & Plan Note (Signed)
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  Recommendations #1 fiber supplementation #2 align when she has episodes of diarrhea

## 2011-12-27 ENCOUNTER — Ambulatory Visit (HOSPITAL_BASED_OUTPATIENT_CLINIC_OR_DEPARTMENT_OTHER): Payer: 59 | Admitting: Hematology & Oncology

## 2011-12-27 ENCOUNTER — Other Ambulatory Visit (HOSPITAL_BASED_OUTPATIENT_CLINIC_OR_DEPARTMENT_OTHER): Payer: 59 | Admitting: Lab

## 2011-12-27 VITALS — BP 136/78 | HR 86 | Temp 97.3°F | Resp 20 | Ht 64.0 in | Wt 221.0 lb

## 2011-12-27 DIAGNOSIS — D509 Iron deficiency anemia, unspecified: Secondary | ICD-10-CM

## 2011-12-27 DIAGNOSIS — D472 Monoclonal gammopathy: Secondary | ICD-10-CM

## 2011-12-27 LAB — CBC WITH DIFFERENTIAL (CANCER CENTER ONLY)
BASO#: 0 10*3/uL (ref 0.0–0.2)
Eosinophils Absolute: 0 10*3/uL (ref 0.0–0.5)
HGB: 11.8 g/dL (ref 11.6–15.9)
LYMPH%: 29.1 % (ref 14.0–48.0)
MCH: 30.3 pg (ref 26.0–34.0)
MCHC: 33.7 g/dL (ref 32.0–36.0)
MCV: 90 fL (ref 81–101)
MONO%: 7.8 % (ref 0.0–13.0)
NEUT%: 62.2 % (ref 39.6–80.0)
RBC: 3.9 10*6/uL (ref 3.70–5.32)

## 2011-12-27 NOTE — Progress Notes (Signed)
This office note has been dictated.

## 2011-12-28 ENCOUNTER — Telehealth: Payer: Self-pay | Admitting: Hematology & Oncology

## 2011-12-28 NOTE — Telephone Encounter (Signed)
Mailed 06-2012 schedule °

## 2011-12-28 NOTE — Progress Notes (Signed)
CC:   Reuben Likes, M.D.  DIAGNOSIS:  IgG kappa monoclonal gammopathy of undetermined significance.  CURRENT THERAPY:  Observation.  INTERIM HISTORY:  Barbara Cook comes in for followup.  We see her every 6 months.  Since we last saw her, she has been doing well.  She has had no complaints.  She is still working.  When we last saw her, her monoclonal spike was stable at 2.12 g/dL.  Her IgE level was 3380 mg/dL.  Serum kappa light chain was 4.48 mg/dL.  We do check her iron studies.  They have been doing okay also.  Again back in February, her ferritin was 51 with a saturation of 25%.  She has had no fatigue.  There is no change in bowel or bladder habits. There have been no joint aches or pains.  Again, her neck is not bothering her.  PHYSICAL EXAMINATION:  General:  This is a well-developed, well- nourished black female in no obvious distress.  Vital Signs: Temperature 97.2, pulse 86, respiratory rate 20, blood pressure 136/78, weight is 221.  Head and Neck Exam:  Shows a normocephalic, atraumatic skull.  There are no ocular or oral lesions.  There are no palpable cervical or supraclavicular lymph nodes.  Lungs:  Clear bilaterally. Cardiac Exam:  Regular rate and rhythm with a normal S1 and S2.  There are no murmurs, rubs, or bruits.  Abdominal Exam:  Soft with good bowel sounds.  There is no palpable abdominal mass.  There is no palpable hepatosplenomegaly.  Back Exam:  No tenderness over the spine, ribs, or hips.  Extremities:  Show no clubbing, cyanosis, or edema.  LABORATORY STUDIES:  White cell count 5.7, hemoglobin 11.8, hematocrit 35, platelet count 177.  IMPRESSION:  Barbara Cook is a very nice 56 year old African American female with IgG kappa monoclonal gammopathy of undetermined significance.  She is doing well from what I can tell.  I do not see any obvious progression of the MGUS that would suggest myeloma.  We have been seeing Barbara Cook for 7 years.  So far,  everything has been holding very stable.  We will plan to get her back in 6 more months for followup.   ______________________________ Josph Macho, M.D. PRE/MEDQ  D:  12/27/2011  T:  12/28/2011  Job:  [id number]

## 2011-12-29 LAB — IRON AND TIBC
Iron: 94 ug/dL (ref 42–145)
TIBC: 324 ug/dL (ref 250–470)

## 2011-12-29 LAB — PROTEIN ELECTROPHORESIS, SERUM, WITH REFLEX
Alpha-1-Globulin: 4 % (ref 2.9–4.9)
Beta 2: 3.6 % (ref 3.2–6.5)
Gamma Globulin: 31.3 % — ABNORMAL HIGH (ref 11.1–18.8)
M-Spike, %: 2.33 g/dL

## 2011-12-29 LAB — COMPREHENSIVE METABOLIC PANEL
Albumin: 3.8 g/dL (ref 3.5–5.2)
Alkaline Phosphatase: 47 U/L (ref 39–117)
BUN: 19 mg/dL (ref 6–23)
Creatinine, Ser: 0.92 mg/dL (ref 0.50–1.10)
Glucose, Bld: 85 mg/dL (ref 70–99)
Total Bilirubin: 0.4 mg/dL (ref 0.3–1.2)

## 2011-12-29 LAB — KAPPA/LAMBDA LIGHT CHAINS
Kappa free light chain: 4.55 mg/dL — ABNORMAL HIGH (ref 0.33–1.94)
Lambda Free Lght Chn: 1.01 mg/dL (ref 0.57–2.63)

## 2011-12-29 LAB — IGG, IGA, IGM
IgA: 27 mg/dL — ABNORMAL LOW (ref 69–380)
IgM, Serum: 18 mg/dL — ABNORMAL LOW (ref 52–322)

## 2011-12-31 ENCOUNTER — Encounter: Payer: Self-pay | Admitting: Gynecology

## 2011-12-31 DIAGNOSIS — M1990 Unspecified osteoarthritis, unspecified site: Secondary | ICD-10-CM | POA: Insufficient documentation

## 2011-12-31 DIAGNOSIS — K219 Gastro-esophageal reflux disease without esophagitis: Secondary | ICD-10-CM | POA: Insufficient documentation

## 2011-12-31 DIAGNOSIS — D219 Benign neoplasm of connective and other soft tissue, unspecified: Secondary | ICD-10-CM | POA: Insufficient documentation

## 2011-12-31 DIAGNOSIS — N809 Endometriosis, unspecified: Secondary | ICD-10-CM | POA: Insufficient documentation

## 2012-01-06 ENCOUNTER — Telehealth: Payer: Self-pay | Admitting: *Deleted

## 2012-01-06 NOTE — Telephone Encounter (Signed)
Message copied by Anselm Jungling on Thu Jan 06, 2012 11:07 AM ------      Message from: Josph Macho      Created: Sun Jan 02, 2012  8:01 PM       Call - labs are stable. Cindee Lame

## 2012-01-06 NOTE — Telephone Encounter (Signed)
Called patient to let her know that her labwork was stable per d.r ennever

## 2012-01-11 ENCOUNTER — Ambulatory Visit (INDEPENDENT_AMBULATORY_CARE_PROVIDER_SITE_OTHER): Payer: 59 | Admitting: Obstetrics and Gynecology

## 2012-01-11 ENCOUNTER — Encounter: Payer: Self-pay | Admitting: Obstetrics and Gynecology

## 2012-01-11 ENCOUNTER — Other Ambulatory Visit (HOSPITAL_COMMUNITY)
Admission: RE | Admit: 2012-01-11 | Discharge: 2012-01-11 | Disposition: A | Discharge status: Home / Self Care | Payer: 59 | Source: Ambulatory Visit | Attending: Obstetrics and Gynecology | Admitting: Obstetrics and Gynecology

## 2012-01-11 ENCOUNTER — Telehealth: Payer: Self-pay | Admitting: Gastroenterology

## 2012-01-11 VITALS — BP 120/74 | Ht 64.0 in | Wt 222.0 lb

## 2012-01-11 DIAGNOSIS — Z01419 Encounter for gynecological examination (general) (routine) without abnormal findings: Secondary | ICD-10-CM

## 2012-01-11 DIAGNOSIS — M353 Polymyalgia rheumatica: Secondary | ICD-10-CM | POA: Insufficient documentation

## 2012-01-11 NOTE — Progress Notes (Signed)
Patient came to see me today for her annual GYN exam. She does have hot flashes but feels they are tolerable without treatment. She is having no vaginal bleeding. She is having no pelvic pain. She does her lab work with her PCP and her hematologist. She remains on steroids for polymyalgia rheumatica. She had a normal bone density in 2008. She has not had a fracture. She takes calcium and vitamin D. She has always had normal Pap smears. Her last Pap smear was January, 2011. She had her last mammogram October, 2012. She is scheduled 1 for next month.  Physical examination: Kennon Portela present. HEENT within normal limits. Neck: Thyroid not large. No masses. Supraclavicular nodes: not enlarged. Breasts: Examined in both sitting and lying  position. No skin changes and no masses. Abdomen: Soft no guarding rebound or masses or hernia. Pelvic: External: Within normal limits. BUS: Within normal limits. Vaginal:within normal limits. Good estrogen effect. No evidence of cystocele rectocele or enterocele. Cervix: clean. Uterus: Normal size and shape. Adnexa: No masses. Rectovaginal exam: Confirmatory and negative. Extremities: Within normal limits.  Assessment: Mild hot flashes  Plan: Continue yearly mammograms. Bone density.The new Pap smear guidelines were discussed with the patient. Pap done.

## 2012-01-11 NOTE — Telephone Encounter (Signed)
Pt last seen by Dr. Arlyce Dice 10/18/11, at that visit per OV note pt was symptom free from her IBS. Pt is requesting a letter from Dr. Arlyce Dice to excuse her from [place] [employer] due to her IBS. Dr. Arlyce Dice please advise.

## 2012-01-11 NOTE — Patient Instructions (Signed)
Schedule bone density.    

## 2012-01-11 NOTE — Telephone Encounter (Signed)
If pt states that she still has symptoms on and off we can write the letter excusing her of jury duty.

## 2012-01-11 NOTE — Telephone Encounter (Signed)
Spoke with pt and she states that she does continue to have IBS symptoms on and off. Letter to excuse her from jury duty left up front for pt to pick up. Pt aware.

## 2012-01-12 LAB — URINALYSIS W MICROSCOPIC + REFLEX CULTURE
Bilirubin Urine: NEGATIVE
Casts: NONE SEEN
Glucose, UA: NEGATIVE mg/dL
Hgb urine dipstick: NEGATIVE
Leukocytes, UA: NEGATIVE
Protein, ur: NEGATIVE mg/dL
Squamous Epithelial / LPF: NONE SEEN
pH: 6 (ref 5.0–8.0)

## 2012-01-19 ENCOUNTER — Encounter: Payer: Self-pay | Admitting: Obstetrics and Gynecology

## 2012-02-10 ENCOUNTER — Ambulatory Visit (INDEPENDENT_AMBULATORY_CARE_PROVIDER_SITE_OTHER): Payer: 59

## 2012-02-10 DIAGNOSIS — Z13820 Encounter for screening for osteoporosis: Secondary | ICD-10-CM

## 2012-02-10 DIAGNOSIS — M353 Polymyalgia rheumatica: Secondary | ICD-10-CM

## 2012-03-03 ENCOUNTER — Encounter: Payer: Self-pay | Admitting: Obstetrics and Gynecology

## 2012-04-02 ENCOUNTER — Emergency Department (HOSPITAL_COMMUNITY): Payer: 59

## 2012-04-02 ENCOUNTER — Emergency Department (HOSPITAL_COMMUNITY)
Admission: EM | Admit: 2012-04-02 | Discharge: 2012-04-02 | Disposition: A | Discharge status: Home / Self Care | Payer: 59 | Attending: Emergency Medicine | Admitting: Emergency Medicine

## 2012-04-02 DIAGNOSIS — M353 Polymyalgia rheumatica: Secondary | ICD-10-CM | POA: Insufficient documentation

## 2012-04-02 DIAGNOSIS — Y9229 Other specified public building as the place of occurrence of the external cause: Secondary | ICD-10-CM | POA: Insufficient documentation

## 2012-04-02 DIAGNOSIS — Z8742 Personal history of other diseases of the female genital tract: Secondary | ICD-10-CM | POA: Insufficient documentation

## 2012-04-02 DIAGNOSIS — Y9389 Activity, other specified: Secondary | ICD-10-CM | POA: Insufficient documentation

## 2012-04-02 DIAGNOSIS — Z87891 Personal history of nicotine dependence: Secondary | ICD-10-CM | POA: Insufficient documentation

## 2012-04-02 DIAGNOSIS — K219 Gastro-esophageal reflux disease without esophagitis: Secondary | ICD-10-CM | POA: Insufficient documentation

## 2012-04-02 DIAGNOSIS — Z7901 Long term (current) use of anticoagulants: Secondary | ICD-10-CM | POA: Insufficient documentation

## 2012-04-02 DIAGNOSIS — Z79899 Other long term (current) drug therapy: Secondary | ICD-10-CM | POA: Insufficient documentation

## 2012-04-02 DIAGNOSIS — IMO0002 Reserved for concepts with insufficient information to code with codable children: Secondary | ICD-10-CM | POA: Insufficient documentation

## 2012-04-02 DIAGNOSIS — M129 Arthropathy, unspecified: Secondary | ICD-10-CM | POA: Insufficient documentation

## 2012-04-02 DIAGNOSIS — S9030XA Contusion of unspecified foot, initial encounter: Secondary | ICD-10-CM | POA: Insufficient documentation

## 2012-04-02 NOTE — ED Provider Notes (Signed)
Medical screening examination/treatment/procedure(s) were performed by non-physician practitioner and as supervising physician I was immediately available for consultation/collaboration.  Marwan T Powers, MD 04/02/12 1600 

## 2012-04-02 NOTE — ED Provider Notes (Signed)
History     CSN: [id number]  Arrival date & time 04/02/12  1357   First MD Initiated Contact with Patient 04/02/12 1425      Chief Complaint  Patient presents with  . Foot Injury    (Consider location/radiation/quality/duration/timing/severity/associated sxs/prior treatment) HPI  Patient presents to the emergency department for evaluation of her right foot after an injury last night. She says that she was at [employer] when she accidentally dropped a can, a very large can, of pineapple juice on her foot because it fell to the back. She says it away and her right on the top of her right foot. She states it hurt much worse when the incident happened and it was swollen she used ice and Tylenol and the pain is currently significantly today it is not that painful but she notices a lump over the area and is afraid of developing a blood clot. nad vss  Past Medical History  Diagnosis Date  . Arthropathy, unspecified, site unspecified   . GERD (gastroesophageal reflux disease)   . Arthritis   . MGUS (monoclonal gammopathy of unknown significance) 06/28/2011  . Endometriosis   . Fibroid   . Polymyalgia rheumatica     Past Surgical History  Procedure Date  . Hernia repair   . Tubal ligation   . Neck surgery   . Fistula repair surg     Family History  Problem Relation Age of Onset  . Lung cancer Maternal Uncle   . Throat cancer Maternal Aunt   . Diabetes Maternal Grandmother   . Hypertension Maternal Grandmother   . Heart disease Maternal Grandmother   . Heart disease Paternal Grandmother   . Colon cancer Neg Hx   . Heart disease Mother     History  Substance Use Topics  . Smoking status: Former Games developer  . Smokeless tobacco: Never Used  . Alcohol Use: No    OB History    Grav Para Term Preterm Abortions TAB SAB Ect Mult Living   1 1 1       1       Review of Systems  Review of Systems  Gen: no weight loss, fevers, chills, night sweats  Neck: no neck pain  Lungs:No  wheezing, coughing or hemoptysis GU: no dysuria or gross hematuria  MSK:  Right foot injury Neuro: no headache, no focal neurologic deficits  Skin: no abnormalities Psyche: negative.   Allergies  Codeine  Home Medications   Current Outpatient Rx  Name  Route  Sig  Dispense  Refill  . ACETAMINOPHEN ER 650 MG PO TBCR   Oral   Take 650 mg by mouth every 8 (eight) hours as needed.           . DULCOLAX PO   Oral   Take by mouth. As needed          . CALCIUM & MAGNESIUM CARBONATES PO   Oral   Take by mouth.         Marland Kitchen VITAMIN D 1000 UNITS PO CAPS   Oral   Take 1,000 Units by mouth daily.          . OMEGA-3 FATTY ACIDS 1000 MG PO CAPS   Oral   Take 2 g by mouth daily.           Marland Kitchen MAGNESIUM 30 MG PO TABS   Oral   Take by mouth daily.          Marland Kitchen ONE-DAILY MULTI VITAMINS PO TABS  Oral   Take 1 tablet by mouth daily.         Marland Kitchen PRILOSEC OTC PO   Oral   Take by mouth.         Marland Kitchen POTASSIUM CHLORIDE 8 MEQ PO TBCR   Oral   Take 8 mEq by mouth daily.           Marland Kitchen PREDNISONE 5 MG PO TABS   Oral   Take 5 mg by mouth daily.           Marland Kitchen SPIRONOLACTONE 25 MG PO TABS   Oral   Take 25 mg by mouth daily.           . TORSEMIDE 20 MG PO TABS   Oral   Take 20 mg by mouth daily.             BP 126/64  Temp 97.8 F (36.6 C) (Oral)  Resp 18  SpO2 98%  Physical Exam  Nursing note and vitals reviewed. Constitutional: She appears well-developed and well-nourished. No distress.  HENT:  Head: Normocephalic and atraumatic.  Eyes: Pupils are equal, round, and reactive to light.  Neck: Normal range of motion. Neck supple.  Cardiovascular: Normal rate and regular rhythm.   Pulmonary/Chest: Effort normal.  Musculoskeletal:       Right ankle: She exhibits swelling. She exhibits normal range of motion, no ecchymosis, no deformity, no laceration and normal pulse. tenderness (proximal ventral foot tender with hematoma). Achilles tendon normal.        Feet:  Neurological: She is alert.  Skin: Skin is warm and dry.    ED Course  Procedures (including critical care time)  Labs Reviewed - No data to display Dg Foot Complete Right  04/02/2012  *RADIOLOGY REPORT*  Clinical Data: Dorsal foot pain  RIGHT FOOT COMPLETE - 3+ VIEW  Comparison: None.  Findings: Minor degenerative changes of the right first MTP joint and the interphalangeal joints of all five toes.  Normal alignment. No fracture evident.  No focal swelling or radiopaque foreign body.  IMPRESSION: No acute osseous finding.   Original Report Authenticated By: Judie Petit. Shick, M.D.      1. Foot contusion       MDM  Patient has no decreased range of motion and her pedal pulses are physiologic. She came in because of concern of developing a blood clot. I reassured her and advised her of the symptoms of DVT and PE. She can take 81mg  aspirin for two weeks if she is very concerned. Told to use RICE protocol. She declines pain medication as she is not hurting very much.  Pt has been advised of the symptoms that warrant their return to the ED. Patient has voiced understanding and has agreed to follow-up with the PCP or specialist.         Dorthula Matas, PA 04/02/12 1507

## 2012-04-02 NOTE — ED Notes (Signed)
Pt was at [employer] and a can of pineapple juice fell on top of pt  Right foot. Pt put ice on it last night but it still very painful today.

## 2012-04-02 NOTE — ED Notes (Signed)
Pt able to ambulate on foot and bend foot up and down. Pt states there is a knot in the bend of her foot and is just worried about it.

## 2012-04-13 ENCOUNTER — Other Ambulatory Visit: Payer: Self-pay | Admitting: Internal Medicine

## 2012-04-13 DIAGNOSIS — R911 Solitary pulmonary nodule: Secondary | ICD-10-CM

## 2012-04-18 ENCOUNTER — Ambulatory Visit
Admission: RE | Admit: 2012-04-18 | Discharge: 2012-04-18 | Disposition: A | Discharge status: Home / Self Care | Payer: 59 | Source: Ambulatory Visit | Attending: Internal Medicine | Admitting: Internal Medicine

## 2012-04-18 DIAGNOSIS — R911 Solitary pulmonary nodule: Secondary | ICD-10-CM

## 2012-04-18 MED ORDER — IOHEXOL 300 MG/ML  SOLN
75.0000 mL | Freq: Once | INTRAMUSCULAR | Status: AC | PRN
  Administered 2012-04-18: 75 mL via INTRAVENOUS

## 2012-06-26 ENCOUNTER — Ambulatory Visit: Payer: 59 | Admitting: Medical

## 2012-06-26 ENCOUNTER — Telehealth: Payer: Self-pay | Admitting: Hematology & Oncology

## 2012-06-26 ENCOUNTER — Other Ambulatory Visit: Payer: 59 | Admitting: Lab

## 2012-06-26 NOTE — Telephone Encounter (Signed)
Left pt message to call and reschedule 2-17 appointments

## 2013-01-12 ENCOUNTER — Encounter: Payer: Self-pay | Admitting: Gynecology

## 2013-01-12 ENCOUNTER — Encounter: Payer: 59 | Admitting: Gynecology

## 2013-02-05 ENCOUNTER — Ambulatory Visit: Payer: 59 | Admitting: Neurology

## 2013-02-16 ENCOUNTER — Ambulatory Visit (INDEPENDENT_AMBULATORY_CARE_PROVIDER_SITE_OTHER): Payer: 59 | Admitting: Neurology

## 2013-02-16 ENCOUNTER — Encounter: Payer: Self-pay | Admitting: Neurology

## 2013-02-16 ENCOUNTER — Encounter (INDEPENDENT_AMBULATORY_CARE_PROVIDER_SITE_OTHER): Payer: Self-pay

## 2013-02-16 ENCOUNTER — Encounter: Payer: Self-pay | Admitting: Gynecology

## 2013-02-16 VITALS — BP 119/85 | HR 72 | Ht 64.5 in | Wt 215.0 lb

## 2013-02-16 DIAGNOSIS — R269 Unspecified abnormalities of gait and mobility: Secondary | ICD-10-CM | POA: Insufficient documentation

## 2013-02-16 DIAGNOSIS — M353 Polymyalgia rheumatica: Secondary | ICD-10-CM

## 2013-02-16 DIAGNOSIS — D472 Monoclonal gammopathy: Secondary | ICD-10-CM

## 2013-02-16 DIAGNOSIS — K589 Irritable bowel syndrome without diarrhea: Secondary | ICD-10-CM

## 2013-02-16 NOTE — Progress Notes (Signed)
[facility]  PATIENT: Barbara Cook DOB: 08/31/1955  HISTORICAL  Barbara Cook is a 57 yo RH AAF, referred by her Rheumatologist Dr. Dierdre Forth and primary care Dr. Renne Crigler for evaluation of left lower extremity weakness.  She had a past medical history of obesity, also carried a diagnosis of polymyalgia rheumatica, she presented with left leg weakness, gait difficulty also bilateral lower extremity stiffness, anterior thigh muscle achiness,,   before 2009, she had extensive evaluation at that time, MRI cervical spine showed  mild progression of multilevel disc protrusions resulting in mild spinal stenosis at C3-C4, C4-C5 and C6-C7. No associated spinal cord signal abnormality.   MRI brain and thoracic, lumbar spine was essentially normal.  Over the years, her symptoms persistent, there was no significant worsening    In 2011, she caught cold, coughing, she complains of severe neck pain, was told that she had  osteomyelitis,    patient had cervical spine fusion by  Dr.Tooke, at [facility]  which has helped her neck pain, but she continued to have bilateral anterior thigh muscle tightness specificity, left leg muscle weakness,     She  denies trouble with bowel, bladder, she was treated with prednisone, for diagnosis of possible polymyalgia rheumatica,  with no sginficant improvement.  CT cervical in 2012 showed status post fusion at C4-5 without to evidence for residual or recurrent stenosis. The graft appears well fused at both the C4 and C5 levels. Uncovertebral spurring with residual foraminal stenosis bilaterally at C3-4, worse on the left. Mild uncovertebral disease at C5-6 and C6-7 without significant stenosis at either level.     REVIEW OF SYSTEMS: Full 14 system review of systems performed and notable only for weakness, swelling in legs, decreased energy, achy muscles  ALLERGIES: Allergies  Allergen Reactions  . Codeine     HOME  MEDICATIONS: Outpatient Prescriptions Prior to Visit  Medication Sig Dispense Refill  . acetaminophen (TYLENOL) 650 MG CR tablet Take 650 mg by mouth every 8 (eight) hours as needed.        . Bisacodyl (DULCOLAX PO) Take by mouth. As needed       . CALCIUM & MAGNESIUM CARBONATES PO Take by mouth.      . Cholecalciferol (VITAMIN D) 1000 UNITS capsule Take 1,000 Units by mouth daily.       . fish oil-omega-3 fatty acids 1000 MG capsule Take 2 g by mouth daily.        . magnesium 30 MG tablet Take by mouth daily.       . Multiple Vitamin (MULTIVITAMIN) tablet Take 1 tablet by mouth daily.      . Omeprazole Magnesium (PRILOSEC OTC PO) Take by mouth.      . potassium chloride (KLOR-CON) 8 MEQ CR tablet Take 8 mEq by mouth daily.        . predniSONE (DELTASONE) 5 MG tablet Take 5 mg by mouth daily.        Marland Kitchen spironolactone (ALDACTONE) 25 MG tablet Take 25 mg by mouth daily.        Marland Kitchen torsemide (DEMADEX) 20 MG tablet Take 20 mg by mouth daily.         No facility-administered medications prior to visit.    PAST MEDICAL HISTORY: Past Medical History  Diagnosis Date  . Arthropathy, unspecified, site unspecified   . GERD (gastroesophageal reflux disease)   . Arthritis   . MGUS (monoclonal gammopathy of unknown significance) 2020/01/812  . Endometriosis   .  Fibroid   . Polymyalgia rheumatica     PAST SURGICAL HISTORY: Past Surgical History  Procedure Laterality Date  . Hernia repair    . Tubal ligation    . Neck surgery    . Fistula repair surg      FAMILY HISTORY: Family History  Problem Relation Age of Onset  . Lung cancer Maternal Uncle   . Throat cancer Maternal Aunt   . Diabetes Maternal Grandmother   . Hypertension Maternal Grandmother   . Heart disease Maternal Grandmother   . Heart disease Paternal Grandmother   . Colon cancer Neg Hx   . Heart disease Mother     SOCIAL HISTORY:  History   Social History  . Marital Status: Married    Spouse Name: Adela Lank    Number of  Children: 1  . Years of Education: 12   Occupational History  . [employer]   Social History Main Topics  . Smoking status: Former Smoker    Types: Cigarettes  . Smokeless tobacco: Never Used     Comment: Quit 15 years ago.  . Alcohol Use: No  . Drug Use: No  . Sexual Activity: Yes    Birth Control/ Protection: Surgical   Social History Narrative   Patient lives at home with her husband Adela Lank). Patient works full time.   Education- High school   Right handed.   Caffeine- None   PHYSICAL EXAM   Filed Vitals:   02/16/13 1445  BP: 119/85  Pulse: 72  Height: 5' 4.5" (1.638 m)  Weight: 215 lb (97.523 kg)    Not recorded    Body mass index is 36.35 kg/(m^2).   Generalized: In no acute distress  Neck: Supple, no carotid bruits   Cardiac: Regular rate rhythm  Pulmonary: Clear to auscultation bilaterally  Musculoskeletal: No deformity  Neurological examination  Mentation: Alert oriented to time, place, history taking, and causual conversation, obesity  Cranial nerve II-XII: Pupils were equal round reactive to light extraocular movements were full, visual field were full on confrontational test. facial sensation and strength were normal. hearing was intact to finger rubbing bilaterally. Uvula tongue midline.  head turning and shoulder shrug and were normal and symmetric.Tongue protrusion into cheek strength was normal.  Motor:  She has mild left hip flexion weakness, she also has mild left shoulder abduction, external rotation, weakness  Sensory: Intact to light touch, pinprick, preserved vibratory sensation, and proprioception at toes.  Coordination: Normal finger to nose, heel-to-shin bilaterally there was no truncal ataxia  Gait: wide based, cautious, unsteady gait, she has trouble with tiptoe, heel and tandem walking   Romberg signs: Negative  Deep tendon reflexes: Brachioradialis 2/3, biceps 2/3, triceps 2/2, patellar trace Achilles  trace, plantar responses were exensor bilaterally.   DIAGNOSTIC DATA (LABS, IMAGING, TESTING) - I reviewed patient records, labs, notes, testing and imaging myself where available.  Lab Results  Component Value Date   WBC 5.7 12/27/2011   HGB 11.8 12/27/2011   HCT 35.0 12/27/2011   MCV 90 12/27/2011   PLT 177 12/27/2011      Component Value Date/Time   NA 139 12/27/2011 1455   K 4.3 12/27/2011 1455   CL 105 12/27/2011 1455   CO2 27 12/27/2011 1455   GLUCOSE 85 12/27/2011 1455   BUN 19 12/27/2011 1455   CREATININE 0.92 12/27/2011 1455   CALCIUM 9.9 12/27/2011 1455   PROT 8.3 12/27/2011 1455   ALBUMIN 3.8 12/27/2011 1455   AST 21  12/27/2011 1455   ALT 25 12/27/2011 1455   ALKPHOS 47 12/27/2011 1455   BILITOT 0.4 12/27/2011 1455   ASSESSMENT AND PLAN   57 years old right-handed [place] American female, with a long-standing history of left leg proximal weakness, bilateral lower extremity muscle achy, specificity, hyperreflexia of the left upper extremity, bilateral Babinski signs,   1. need to rule out central nervous system etiology, such as right hemisphere pathology,  2. Proceed with MRI of the brain, 3. She does not want to consider physical therapy at this point,  4 return to clinic in 6 months       Levert Feinstein, M.D. Ph.D.  [facility] [address] [place], [place] [address] [phone]

## 2013-02-23 ENCOUNTER — Telehealth: Payer: Self-pay | Admitting: Hematology & Oncology

## 2013-02-23 NOTE — Telephone Encounter (Signed)
Per Lowella Bandy, make pt an appt. Mailed appt schedule out to patient's home today.

## 2013-03-13 ENCOUNTER — Encounter: Payer: Self-pay | Admitting: Gynecology

## 2013-03-14 ENCOUNTER — Ambulatory Visit (INDEPENDENT_AMBULATORY_CARE_PROVIDER_SITE_OTHER): Payer: 59

## 2013-03-14 DIAGNOSIS — R269 Unspecified abnormalities of gait and mobility: Secondary | ICD-10-CM

## 2013-03-14 DIAGNOSIS — D472 Monoclonal gammopathy: Secondary | ICD-10-CM

## 2013-03-14 DIAGNOSIS — K589 Irritable bowel syndrome without diarrhea: Secondary | ICD-10-CM

## 2013-03-14 DIAGNOSIS — M353 Polymyalgia rheumatica: Secondary | ICD-10-CM

## 2013-03-15 ENCOUNTER — Other Ambulatory Visit: Payer: Self-pay

## 2013-03-16 ENCOUNTER — Encounter: Payer: Self-pay | Admitting: Gynecology

## 2013-03-16 ENCOUNTER — Ambulatory Visit (INDEPENDENT_AMBULATORY_CARE_PROVIDER_SITE_OTHER): Payer: 59 | Admitting: Gynecology

## 2013-03-16 VITALS — BP 118/76 | Ht 64.0 in | Wt 217.0 lb

## 2013-03-16 DIAGNOSIS — Z01419 Encounter for gynecological examination (general) (routine) without abnormal findings: Secondary | ICD-10-CM

## 2013-03-16 DIAGNOSIS — D251 Intramural leiomyoma of uterus: Secondary | ICD-10-CM

## 2013-03-16 DIAGNOSIS — L989 Disorder of the skin and subcutaneous tissue, unspecified: Secondary | ICD-10-CM

## 2013-03-16 NOTE — Patient Instructions (Signed)
Office will call you with the biopsy results. Followup in one year for annual exam.

## 2013-03-16 NOTE — Progress Notes (Signed)
Patient ID: Barbara Cook, female   DOB: 1956/05/09, 57 y.o.   MRN: [id number] Barbara Cook 17-Apr-1956 [id number]        57 y.o.  G1P1001 for annual exam.  Former patient of Dr. Eda Paschal. Several issues below.  Past medical history,surgical history, problem list, medications, allergies, family history and social history were all reviewed and documented in the EPIC chart.  ROS:  Performed and pertinent positives and negatives are included in the history, assessment and plan .  Exam: Kim assistant Filed Vitals:   03/16/13 0754  BP: 118/76  Height: 5\' 4"  (1.626 m)  Weight: 217 lb (98.431 kg)   General appearance  Normal Skin grossly normal Head/Neck normal with no cervical or supraclavicular adenopathy thyroid normal Lungs  clear Cardiac RR, without RMG Abdominal  soft, nontender, without masses, organomegaly or hernia Breasts  examined lying and sitting without masses, retractions, discharge or axillary adenopathy. Pelvic  Ext/BUS/vagina  large pedunculated fibroepithelial polyp right buttocks crease with thigh.  Cervix  Normal  Uterus  anteverted, normal size, shape and contour, midline and mobile nontender   Adnexa  Without masses or tenderness    Anus and perineum  normal   Rectovaginal  normal sphincter tone without palpated masses or tenderness.   Procedure: Skin surrounding the base of fibroepithelial polyp with cleansed with Betadine, infiltrated with 1% lidocaine and the polyp was excised in its entirety at the level of the surrounding skin. The skin incision closed using 4-0 Vicryl with interrupted cutaneous stitch. Sterile dressing applied. Postoperative instructions given.   Assessment/Plan:  57 y.o. G66P1001 female for annual exam.   1. Postmenopausal. Without significant hot flushes, night sweats, vaginal dryness or dyspareunia. No bleeding. Continue to monitor. Report any bleeding. 2. Large fibroepithelial polyp right buttocks. Patient desires removed as noted above.  Patient will followup for pathology results. 3. History leiomyoma, multiple small. Exam is normal. We'll continue to monitor with annual exams. 4. Pap smear 2013. No Pap smear done today. No history of abnormal Pap smears previously. Plan repeat at 3 year interval. 5. Mammography 02/2012. Patient knows to schedule now agrees to do so. SBE monthly reviewed. 6. DEXA 2013 normal. Recommend repeat at 5 year interval. Increase calcium vitamin D reviewed. 7. Colonoscopy 2007 with planned repeat at 10 year interval. 8. Health maintenance. No lab work done as this is all done through her primary physician's office. Followup for pathology results otherwise one year, sooner as needed  Note: This document was prepared with digital dictation and possible smart phrase technology. Any transcriptional errors that result from this process are unintentional.   Dara Lords MD, 8:26 AM 03/16/2013

## 2013-03-19 ENCOUNTER — Encounter: Payer: Self-pay | Admitting: Obstetrics and Gynecology

## 2013-03-22 NOTE — Progress Notes (Signed)
Quick Note:  Please call patient, mild abnormalities on MRI brain, age related changes, would not explain her difficulties, She should continue follow up visit. ______

## 2013-03-23 NOTE — Progress Notes (Signed)
Quick Note:  Spoke to patient and relayed MRI results, age related changes, per Dr. Terrace Arabia. ______

## 2013-03-26 ENCOUNTER — Encounter: Payer: Self-pay | Admitting: Gynecology

## 2013-04-12 ENCOUNTER — Ambulatory Visit (HOSPITAL_BASED_OUTPATIENT_CLINIC_OR_DEPARTMENT_OTHER): Payer: 59 | Admitting: Hematology & Oncology

## 2013-04-12 ENCOUNTER — Other Ambulatory Visit: Payer: Self-pay | Admitting: *Deleted

## 2013-04-12 ENCOUNTER — Other Ambulatory Visit (HOSPITAL_BASED_OUTPATIENT_CLINIC_OR_DEPARTMENT_OTHER): Payer: 59 | Admitting: Lab

## 2013-04-12 VITALS — BP 127/60 | HR 83 | Temp 97.5°F | Resp 16 | Wt 213.0 lb

## 2013-04-12 DIAGNOSIS — D472 Monoclonal gammopathy: Secondary | ICD-10-CM

## 2013-04-12 DIAGNOSIS — D509 Iron deficiency anemia, unspecified: Secondary | ICD-10-CM

## 2013-04-12 LAB — CBC WITH DIFFERENTIAL (CANCER CENTER ONLY)
BASO%: 0.4 % (ref 0.0–2.0)
HCT: 33.3 % — ABNORMAL LOW (ref 34.8–46.6)
LYMPH#: 2.4 10*3/uL (ref 0.9–3.3)
MONO#: 0.6 10*3/uL (ref 0.1–0.9)
NEUT#: 2.2 10*3/uL (ref 1.5–6.5)
Platelets: 179 10*3/uL (ref 145–400)
RDW: 13.9 % (ref 11.1–15.7)
WBC: 5.3 10*3/uL (ref 3.9–10.0)

## 2013-04-12 NOTE — Progress Notes (Signed)
This office note has been dictated.

## 2013-04-13 LAB — FERRITIN CHCC: Ferritin: 106 ng/ml (ref 9–269)

## 2013-04-13 LAB — IRON AND TIBC CHCC: %SAT: 27 % (ref 21–57)

## 2013-04-16 LAB — PROTEIN ELECTROPHORESIS, SERUM
Beta Globulin: 4.7 % (ref 4.7–7.2)
Gamma Globulin: 34.1 % — ABNORMAL HIGH (ref 11.1–18.8)
M-Spike, %: 2.65 g/dL
Total Protein, Serum Electrophoresis: 8.9 g/dL — ABNORMAL HIGH (ref 6.0–8.3)

## 2013-04-16 LAB — COMPREHENSIVE METABOLIC PANEL
ALT: 27 U/L (ref 0–35)
CO2: 31 mEq/L (ref 19–32)
Calcium: 9.6 mg/dL (ref 8.4–10.5)
Chloride: 98 mEq/L (ref 96–112)
Potassium: 3.6 mEq/L (ref 3.5–5.3)
Sodium: 134 mEq/L — ABNORMAL LOW (ref 135–145)
Total Protein: 8.9 g/dL — ABNORMAL HIGH (ref 6.0–8.3)

## 2013-04-16 LAB — KAPPA/LAMBDA LIGHT CHAINS
Kappa free light chain: 11.8 mg/dL — ABNORMAL HIGH (ref 0.33–1.94)
Kappa:Lambda Ratio: 12.97 — ABNORMAL HIGH (ref 0.26–1.65)

## 2013-04-16 LAB — IGG, IGA, IGM
IgA: 21 mg/dL — ABNORMAL LOW (ref 69–380)
IgM, Serum: 17 mg/dL — ABNORMAL LOW (ref 52–322)

## 2013-04-19 NOTE — Progress Notes (Signed)
CC:   Walter D. Pharr, M.D.  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  CURRENT THERAPY:  Observation.  INTERIM HISTORY:  Ms. Cumba comes in for a followup.  She is doing fairly well.  We last saw her back in August 2013.  I am not sure why she has not been here for a year.  We have been seeing her for probably about 7-8 years.  Her monoclonal studies back in 2013 showed her M-spike to be 2.33 g/dL. Her IgG level was 2670 mg/dL.  Her Kappa light chain is 4.55 mg/dL.  She feels fairly well.  She does have some fatigue.  She has had no problems with bowels or bladder.  She had her last mammogram back in November of this year.  The mammogram did not show any suspicious calcifications.  She has had no bony pain.  She has had no fevers, sweats, or chills. There has been no change in her medication.  PHYSICAL EXAMINATION:  General:  This is a mildly obese, African American female, in no obvious distress.  Vital Signs:  Temperature of 97.5, pulse 83, respiratory rate 16, blood pressure 127/60, and weight is 213 pounds.  Head and Neck:  Normocephalic, atraumatic skull.  There are no ocular or oral lesions.  There are no palpable cervical or supraclavicular lymph nodes.  Lungs:  Clear bilaterally.  Cardiac: Regular rate and rhythm with a normal S1 and S2.  There are no murmurs, rubs, or bruits.  Abdomen:  Soft.  She has good bowel sounds.  There is no palpable abdominal mass.  There is no palpable hepatosplenomegaly. Back:  No tenderness over the spine, ribs, or hips.  Extremities:  No clubbing, cyanosis or edema.  Neurological:  No focal neurological deficits.  LABORATORY STUDIES:  White cell count is 5.3, hemoglobin 10.8, hematocrit 33.3, and platelet count 179.  Ferritin is 106 with an iron saturation of 27%.  BUN is 20, creatinine 0.96.  Paraprotein monoclonal spike is 2.65 g/dL. IgG level was 3290 mg/dL.  Kappa light chain is 11.8  mg/dL.  IMPRESSION:  Ms. Dant is a very nice 57-year-old African American female.  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  We have been following this for about 7 or 8 years.  I have noted that the monoclonal studies do show these M-spike to be increasing.  She is  a little  bit more anemic.  I am not sure if this is all related to the monoclonal spike.  I do not know if we have seen any kind of progression of her disease.  Of note, she had a skin biopsy done back in November.  This was negative for any malignancy.  I think that we have to be cautious here.  I think that we just need to see how things look when we see her back.  If we find that her studies are increasing, then we are going to have to probably get her set up with a bone marrow test.    ______________________________ Simara Rhyner R Bular Hickok, M.D. PRE/MEDQ  D:  04/18/2013  T:  04/19/2013  Job:  [id number] 

## 2013-04-26 ENCOUNTER — Ambulatory Visit: Payer: 59 | Admitting: Sports Medicine

## 2013-05-09 ENCOUNTER — Ambulatory Visit: Payer: 59 | Admitting: Sports Medicine

## 2013-06-04 ENCOUNTER — Ambulatory Visit: Payer: 59 | Admitting: Sports Medicine

## 2013-06-06 ENCOUNTER — Telehealth: Payer: Self-pay | Admitting: Neurology

## 2013-06-06 ENCOUNTER — Encounter: Payer: Self-pay | Admitting: Neurology

## 2013-06-06 NOTE — Telephone Encounter (Signed)
Left message for patient about rescheduling 08/17/13 appointment to 08/31/13 per Dr. Rhea Belton schedule, printed and sent letter.

## 2013-06-14 ENCOUNTER — Other Ambulatory Visit: Payer: Self-pay | Admitting: Internal Medicine

## 2013-06-14 DIAGNOSIS — R29898 Other symptoms and signs involving the musculoskeletal system: Secondary | ICD-10-CM

## 2013-06-23 ENCOUNTER — Ambulatory Visit
Admission: RE | Admit: 2013-06-23 | Discharge: 2013-06-23 | Disposition: A | Discharge status: Home / Self Care | Payer: 59 | Source: Ambulatory Visit | Attending: Internal Medicine | Admitting: Internal Medicine

## 2013-06-23 DIAGNOSIS — R29898 Other symptoms and signs involving the musculoskeletal system: Secondary | ICD-10-CM

## 2013-06-23 MED ORDER — GADOBENATE DIMEGLUMINE 529 MG/ML IV SOLN
20.0000 mL | Freq: Once | INTRAVENOUS | Status: AC | PRN
  Administered 2013-06-23: 20 mL via INTRAVENOUS

## 2013-07-03 ENCOUNTER — Ambulatory Visit: Payer: 59 | Admitting: Sports Medicine

## 2013-07-16 ENCOUNTER — Encounter: Payer: Self-pay | Admitting: Sports Medicine

## 2013-07-16 ENCOUNTER — Ambulatory Visit: Payer: 59 | Admitting: Sports Medicine

## 2013-07-16 ENCOUNTER — Ambulatory Visit (INDEPENDENT_AMBULATORY_CARE_PROVIDER_SITE_OTHER): Payer: 59 | Admitting: Sports Medicine

## 2013-07-16 VITALS — BP 125/71 | Ht 64.0 in | Wt 216.0 lb

## 2013-07-16 DIAGNOSIS — M353 Polymyalgia rheumatica: Secondary | ICD-10-CM

## 2013-07-16 NOTE — Assessment & Plan Note (Addendum)
Reported hx of bilateral lower extremity weakness has significantly improved per pt with resuming prednisone. She has no appreciable weakness on exam today. Suspect weakness is all related to PMR and should continue to be managed by rheumatology. Patient is asking about the possibility of physical therapy but I would defer that decision to Dr Amil Amen. Sports medicine team will be available as needed should any problems arise in the future.

## 2013-07-16 NOTE — Progress Notes (Signed)
   HPI:  Pt presents today as new patient for evaluation of bilateral lower extremity weakness.  Pt states she has a hx of polymyalgia rheumatica, for which she is seen by Dr. Amil Amen of rheumatology. She has been intermittently on prednisone for approximately the last year. Recently she tapered off of her prednisone under the instruction of Dr. Amil Amen, but had reoccurrence of her lower extremity weakness while off of the prednisone so Dr. Amil Amen instructed her to resume it at 15 mg daily about 3 weeks ago. Pt also had an MRI of her L femur after that visit, which was normal. Since resuming the prednisone, her symptoms have greatly improved and she now reports that she has normal strength in both of her legs and hips. She walks for exercise 15-20 minutes in duration, about twice per week. She has follow up scheduled with Dr. Amil Amen on Wednesday of this week. All of her weakness was in her hips and thighs but again it has been approved dramatically since increasing her prednisone.  Prior to resuming the prednisone 3 weeks ago, she saw her PCP Dr. Shelia Media, who referred her here to the sports medicine clinic for further evaluation.   ROS: See HPI  [place]: MGUS, neck surgery in 2011 for myelitis, had surgical fusion and graft. Also hx of arthritis of L hip. No other hx of MSK problems.   PHYSICAL EXAM: BP 125/71  Ht 5\' 4"  (1.626 m)  Wt 216 lb (97.977 kg)  BMI 37.06 kg/m2 Gen: NAD, pleasant and cooperative, well developed and well nourished MSK: no gross deformity of lower extremities bilaterally. 2+DP pulses bilaterally. 5/5 strength with hip abduction/adduction/flexion, knee extension/flexion, and ankle plantarflexion/dorsiflexion/eversion/inversion. Gait normal without significant limp. Able to ambulate on toes and heels without assistance. Full ROM with internal and external rotation of bilateral hips. No crepitus over bilateral knees.  ASSESSMENT/PLAN:  See problem based charting for  assessment/plan.  FOLLOW UP: Keep f/u appt this week with Dr. Marijean Bravo, otherwise f/u at sports medicine prn.  SIGNED: Delorse Limber. Ardelia Mems, [place] Resident PGY-2 [place]

## 2013-07-16 NOTE — Patient Instructions (Signed)
It was nice to meet you today!  Continue to follow up with Dr. Marijean Bravo for your polymyalgia rheumatica. Ask him if he thinks physical therapy would be helpful for you. You don't need to be seen here at Sports Medicine in order to get physical therapy.  You can always call us and schedule an appointment if you have any further sports medicine needs.

## 2013-07-30 ENCOUNTER — Telehealth: Payer: Self-pay | Admitting: Neurology

## 2013-07-30 NOTE — Telephone Encounter (Signed)
Pt returned call about r/s 08/31/13 per Dr. Rhea Belton schedule. Pt needs an apt after 3:00pm, pt states with work she can't get here until 3:30. Please call pt to see if there is something that can be done for this pt. I ask pt if she would like to see the NP, she states no she wants to see the Dr. Marina Cook

## 2013-07-30 NOTE — Telephone Encounter (Signed)
Called pt to schedule an appt on 09/04/13 with Dr. Krista Blue. I advised the pt that if she has any other problems, questions or concerns to call the office. Pt verbalized understanding.

## 2013-08-09 ENCOUNTER — Ambulatory Visit (HOSPITAL_BASED_OUTPATIENT_CLINIC_OR_DEPARTMENT_OTHER): Payer: 59 | Admitting: Lab

## 2013-08-09 ENCOUNTER — Ambulatory Visit (HOSPITAL_BASED_OUTPATIENT_CLINIC_OR_DEPARTMENT_OTHER): Payer: 59 | Admitting: Hematology & Oncology

## 2013-08-09 VITALS — BP 131/65 | HR 107 | Temp 97.8°F | Resp 16 | Wt 220.0 lb

## 2013-08-09 DIAGNOSIS — D509 Iron deficiency anemia, unspecified: Secondary | ICD-10-CM

## 2013-08-09 DIAGNOSIS — D472 Monoclonal gammopathy: Secondary | ICD-10-CM

## 2013-08-09 LAB — CBC WITH DIFFERENTIAL (CANCER CENTER ONLY)
BASO#: 0 10*3/uL (ref 0.0–0.2)
BASO%: 0.1 % (ref 0.0–2.0)
EOS%: 0.3 % (ref 0.0–7.0)
Eosinophils Absolute: 0 10*3/uL (ref 0.0–0.5)
HEMATOCRIT: 33.5 % — AB (ref 34.8–46.6)
HEMOGLOBIN: 11 g/dL — AB (ref 11.6–15.9)
LYMPH#: 1.8 10*3/uL (ref 0.9–3.3)
LYMPH%: 23.8 % (ref 14.0–48.0)
MCH: 29.9 pg (ref 26.0–34.0)
MCHC: 32.8 g/dL (ref 32.0–36.0)
MCV: 91 fL (ref 81–101)
MONO#: 0.5 10*3/uL (ref 0.1–0.9)
MONO%: 6.2 % (ref 0.0–13.0)
NEUT#: 5.3 10*3/uL (ref 1.5–6.5)
NEUT%: 69.6 % (ref 39.6–80.0)
Platelets: 189 10*3/uL (ref 145–400)
RBC: 3.68 10*6/uL — ABNORMAL LOW (ref 3.70–5.32)
RDW: 15 % (ref 11.1–15.7)
WBC: 7.6 10*3/uL (ref 3.9–10.0)

## 2013-08-10 LAB — FERRITIN CHCC: Ferritin: 107 ng/ml (ref 9–269)

## 2013-08-10 LAB — IRON AND TIBC CHCC
%SAT: 22 % (ref 21–57)
Iron: 62 ug/dL (ref 41–142)
TIBC: 288 ug/dL (ref 236–444)
UIBC: 226 ug/dL (ref 120–384)

## 2013-08-13 LAB — KAPPA/LAMBDA LIGHT CHAINS
Kappa free light chain: 10.8 mg/dL — ABNORMAL HIGH (ref 0.33–1.94)
Kappa:Lambda Ratio: 10.09 — ABNORMAL HIGH (ref 0.26–1.65)
Lambda Free Lght Chn: 1.07 mg/dL (ref 0.57–2.63)

## 2013-08-13 LAB — COMPREHENSIVE METABOLIC PANEL WITH GFR
ALT: 25 U/L (ref 0–35)
AST: 20 U/L (ref 0–37)
Albumin: 3.6 g/dL (ref 3.5–5.2)
Alkaline Phosphatase: 51 U/L (ref 39–117)
BUN: 25 mg/dL — ABNORMAL HIGH (ref 6–23)
CO2: 31 meq/L (ref 19–32)
Calcium: 9.3 mg/dL (ref 8.4–10.5)
Chloride: 102 meq/L (ref 96–112)
Creatinine, Ser: 1.08 mg/dL (ref 0.50–1.10)
Glucose, Bld: 130 mg/dL — ABNORMAL HIGH (ref 70–99)
Potassium: 4.3 meq/L (ref 3.5–5.3)
Sodium: 139 meq/L (ref 135–145)
Total Bilirubin: 0.3 mg/dL (ref 0.2–1.2)
Total Protein: 8.1 g/dL (ref 6.0–8.3)

## 2013-08-13 LAB — PROTEIN ELECTROPHORESIS, SERUM, WITH REFLEX
Albumin ELP: 46.4 % — ABNORMAL LOW (ref 55.8–66.1)
Alpha-1-Globulin: 4.1 % (ref 2.9–4.9)
Alpha-2-Globulin: 8.7 % (ref 7.1–11.8)
Beta 2: 3.8 % (ref 3.2–6.5)
Beta Globulin: 4.6 % — ABNORMAL LOW (ref 4.7–7.2)
Gamma Globulin: 32.4 % — ABNORMAL HIGH (ref 11.1–18.8)
M-SPIKE, %: 2.28 g/dL
TOTAL PROTEIN, SERUM ELECTROPHOR: 8.1 g/dL (ref 6.0–8.3)

## 2013-08-13 LAB — IGG, IGA, IGM
IgA: 21 mg/dL — ABNORMAL LOW (ref 69–380)
IgG (Immunoglobin G), Serum: 3090 mg/dL — ABNORMAL HIGH (ref 690–1700)
IgM, Serum: 8 mg/dL — ABNORMAL LOW (ref 52–322)

## 2013-08-13 LAB — IFE INTERPRETATION

## 2013-08-17 ENCOUNTER — Ambulatory Visit: Payer: 59 | Admitting: Neurology

## 2013-08-31 ENCOUNTER — Ambulatory Visit: Payer: 59 | Admitting: Neurology

## 2013-09-04 ENCOUNTER — Ambulatory Visit: Payer: Self-pay | Admitting: Neurology

## 2013-11-07 ENCOUNTER — Telehealth: Payer: Self-pay | Admitting: Hematology & Oncology

## 2013-11-07 NOTE — Telephone Encounter (Signed)
Pt moved 7-2 to 8-3

## 2013-11-08 ENCOUNTER — Other Ambulatory Visit: Payer: 59 | Admitting: Lab

## 2013-11-08 ENCOUNTER — Ambulatory Visit: Payer: 59 | Admitting: Hematology & Oncology

## 2013-12-07 ENCOUNTER — Other Ambulatory Visit: Payer: Self-pay | Admitting: *Deleted

## 2013-12-07 DIAGNOSIS — D472 Monoclonal gammopathy: Secondary | ICD-10-CM

## 2013-12-10 ENCOUNTER — Encounter: Payer: Self-pay | Admitting: Hematology & Oncology

## 2013-12-10 ENCOUNTER — Ambulatory Visit: Payer: 59

## 2013-12-10 ENCOUNTER — Ambulatory Visit (HOSPITAL_BASED_OUTPATIENT_CLINIC_OR_DEPARTMENT_OTHER): Payer: 59 | Admitting: Lab

## 2013-12-10 ENCOUNTER — Ambulatory Visit (HOSPITAL_BASED_OUTPATIENT_CLINIC_OR_DEPARTMENT_OTHER): Payer: 59 | Admitting: Hematology & Oncology

## 2013-12-10 VITALS — BP 125/51 | HR 70 | Temp 97.6°F | Resp 16 | Ht 64.0 in | Wt 225.0 lb

## 2013-12-10 DIAGNOSIS — D472 Monoclonal gammopathy: Secondary | ICD-10-CM

## 2013-12-10 DIAGNOSIS — D649 Anemia, unspecified: Secondary | ICD-10-CM

## 2013-12-10 LAB — CBC WITH DIFFERENTIAL (CANCER CENTER ONLY)
BASO#: 0 10*3/uL (ref 0.0–0.2)
BASO%: 0.3 % (ref 0.0–2.0)
EOS%: 0.3 % (ref 0.0–7.0)
Eosinophils Absolute: 0 10*3/uL (ref 0.0–0.5)
HCT: 32.3 % — ABNORMAL LOW (ref 34.8–46.6)
HGB: 10.8 g/dL — ABNORMAL LOW (ref 11.6–15.9)
LYMPH#: 1.9 10*3/uL (ref 0.9–3.3)
LYMPH%: 27.8 % (ref 14.0–48.0)
MCH: 30.8 pg (ref 26.0–34.0)
MCHC: 33.4 g/dL (ref 32.0–36.0)
MCV: 92 fL (ref 81–101)
MONO#: 0.3 10*3/uL (ref 0.1–0.9)
MONO%: 4.9 % (ref 0.0–13.0)
NEUT#: 4.7 10*3/uL (ref 1.5–6.5)
NEUT%: 66.7 % (ref 39.6–80.0)
PLATELETS: 174 10*3/uL (ref 145–400)
RBC: 3.51 10*6/uL — ABNORMAL LOW (ref 3.70–5.32)
RDW: 14.1 % (ref 11.1–15.7)
WBC: 7 10*3/uL (ref 3.9–10.0)

## 2013-12-10 NOTE — Progress Notes (Signed)
Hematology and Oncology Follow Up Visit  Barbara Cook [id number] [birth year] 58 y.o. 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  Current Therapy:    Observation     Interim History:  Ms.  Cook is back for followup. She is doing well. We I saw her back in March or April. At that point in time, her monoclonal spike was 2.28 g/dL. Her IgG level was 3090 mg/dL. Her kappa light chain was 10.8 mg/dL. Her iron studies showed a ferritin of 107 with an iron saturation 22%. She has polymyalgia rheumatica. She's on prednisone for this. She states that the prednisone is causing her to gain weight. She has some bruising because of the prednisone.  She is on diuretics to try to help.  She her family will be going on vacation in another week or so. She's had no fever. She's had no cough. She's had no nausea or vomiting. She's had no change in bowel or bladder habits.  She is due for a mammogram in the fall.  Medications: Current outpatient prescriptions:acetaminophen (TYLENOL) 650 MG CR tablet, Take 650 mg by mouth every 8 (eight) hours as needed.  , Disp: , Rfl: ;  Bisacodyl (DULCOLAX PO), Take by mouth. As needed , Disp: , Rfl: ;  Calcium Carbonate-Vit D-Min (CALCIUM 1200 PO), Take by mouth every morning., Disp: , Rfl: ;  Cholecalciferol (VITAMIN D) 1000 UNITS capsule, Take 1,000 Units by mouth daily. , Disp: , Rfl:  Magnesium 250 MG TABS, Take by mouth every morning., Disp: , Rfl: ;  Multiple Vitamin (MULTIVITAMIN) tablet, Take 1 tablet by mouth daily., Disp: , Rfl: ;  Omeprazole Magnesium (PRILOSEC OTC PO), Take by mouth., Disp: , Rfl: ;  potassium chloride (KLOR-CON) 8 MEQ CR tablet, Take 8 mEq by mouth daily.  , Disp: , Rfl: ;  predniSONE (DELTASONE) 5 MG tablet, Take 7 mg by mouth daily. , Disp: , Rfl:  spironolactone (ALDACTONE) 25 MG tablet, Take 25 mg by mouth daily.  , Disp: , Rfl: ;  torsemide (DEMADEX) 20 MG tablet, Take 20 mg by mouth daily.  ,  Disp: , Rfl:   Allergies:  Allergies  Allergen Reactions  . Codeine Palpitations    Past Medical History, Surgical history, Social history, and Family History were reviewed and updated.  Review of Systems: As above  Physical Exam:  height is 5\' 4"  (1.626 m) and weight is 225 lb (102.059 kg). Her oral temperature is 97.6 F (36.4 C). Her blood pressure is 125/51 and her pulse is 70. Her respiration is 16.   Obese African American female. Head and neck exam shows no ocular or oral lesions. She has no palpable cervical or supraclavicular lymph nodes. Lungs are clear. Cardiac exam regular rhythm with no murmurs rubs or bruits. Abdomen is soft. She has good bowel sounds. There is no fluid wave. There is no palpable liver or spleen tip. Back exam shows no tenderness over the spine ribs or hips. Extremities shows some 1+ edema in her lower legs. She's good range of motion of her joints. She has good strength. She has good pulses in her distal extremities. Skin exam no rashes. She has scattered ecchymoses which are few. Neurological exam is nonfocal.  Lab Results  Component Value Date   WBC 7.0 12/10/2013   HGB 10.8* 12/10/2013   HCT 32.3* 12/10/2013   MCV 92 12/10/2013   PLT 174 12/10/2013     Chemistry  Component Value Date/Time   NA 139 08/09/2013 1527   K 4.3 08/09/2013 1527   CL 102 08/09/2013 1527   CO2 31 08/09/2013 1527   BUN 25* 08/09/2013 1527   CREATININE 1.08 08/09/2013 1527      Component Value Date/Time   CALCIUM 9.3 08/09/2013 1527   ALKPHOS 51 08/09/2013 1527   AST 20 08/09/2013 1527   ALT 25 08/09/2013 1527   BILITOT 0.3 08/09/2013 1527         Impression and Plan: Barbara Cook is a 58 year old African American female with a MGUS. This will has been holding fairly stable. 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 However, she has no" end organ" issues. She is a little anemic but this might be from medications or possibly iron deficiency. We are  checking her iron studies. We have not done a bone survey on her in probably 4 years. Again, she has never been symptomatic with her bones. She has the polymyalgia rheumatica.  I will plan to get her back in 3 more months.  [name], MD 8/3/20156:29 PM

## 2013-12-11 LAB — FERRITIN CHCC: Ferritin: 100 ng/ml (ref 9–269)

## 2013-12-11 LAB — IRON AND TIBC CHCC
%SAT: 21 % (ref 21–57)
Iron: 57 ug/dL (ref 41–142)
TIBC: 273 ug/dL (ref 236–444)
UIBC: 216 ug/dL (ref 120–384)

## 2013-12-12 ENCOUNTER — Other Ambulatory Visit: Payer: Self-pay | Admitting: *Deleted

## 2013-12-12 DIAGNOSIS — D472 Monoclonal gammopathy: Secondary | ICD-10-CM

## 2013-12-12 LAB — PROTEIN ELECTROPHORESIS, SERUM, WITH REFLEX
ALBUMIN ELP: 46.3 % — AB (ref 55.8–66.1)
ALPHA-1-GLOBULIN: 4 % (ref 2.9–4.9)
ALPHA-2-GLOBULIN: 8.8 % (ref 7.1–11.8)
BETA GLOBULIN: 4.7 % (ref 4.7–7.2)
Beta 2: 2.3 % — ABNORMAL LOW (ref 3.2–6.5)
Gamma Globulin: 33.9 % — ABNORMAL HIGH (ref 11.1–18.8)
M-SPIKE, %: 2.43 g/dL
Total Protein, Serum Electrophoresis: 8.1 g/dL (ref 6.0–8.3)

## 2013-12-12 LAB — COMPREHENSIVE METABOLIC PANEL
ALT: 28 U/L (ref 0–35)
AST: 30 U/L (ref 0–37)
Albumin: 3.6 g/dL (ref 3.5–5.2)
Alkaline Phosphatase: 44 U/L (ref 39–117)
BILIRUBIN TOTAL: 0.3 mg/dL (ref 0.2–1.2)
BUN: 24 mg/dL — ABNORMAL HIGH (ref 6–23)
CO2: 31 meq/L (ref 19–32)
CREATININE: 0.99 mg/dL (ref 0.50–1.10)
Calcium: 9.1 mg/dL (ref 8.4–10.5)
Chloride: 101 mEq/L (ref 96–112)
Glucose, Bld: 147 mg/dL — ABNORMAL HIGH (ref 70–99)
Potassium: 3.5 mEq/L (ref 3.5–5.3)
Sodium: 138 mEq/L (ref 135–145)
Total Protein: 8.1 g/dL (ref 6.0–8.3)

## 2013-12-12 LAB — KAPPA/LAMBDA LIGHT CHAINS
KAPPA LAMBDA RATIO: 17.63 — AB (ref 0.26–1.65)
Kappa free light chain: 16.4 mg/dL — ABNORMAL HIGH (ref 0.33–1.94)
Lambda Free Lght Chn: 0.93 mg/dL (ref 0.57–2.63)

## 2013-12-12 LAB — IFE INTERPRETATION

## 2013-12-12 LAB — IGG, IGA, IGM
IgA: 17 mg/dL — ABNORMAL LOW (ref 69–380)
IgG (Immunoglobin G), Serum: 3010 mg/dL — ABNORMAL HIGH (ref 690–1700)
IgM, Serum: 7 mg/dL — ABNORMAL LOW (ref 52–322)

## 2013-12-13 ENCOUNTER — Encounter: Payer: Self-pay | Admitting: Nurse Practitioner

## 2013-12-26 NOTE — Progress Notes (Signed)
CC:   Barbara Cook. Shelia Media, M.D.  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  CURRENT THERAPY:  Observation.  INTERIM HISTORY:  Barbara Cook comes in for a followup.  She is doing fairly well.  We last saw her back in August 2013.  I am not sure why she has not been here for a year.  We have been seeing her for probably about 7-8 years.  Her monoclonal studies back in 2013 showed her M-spike to be 2.33 g/dL. Her IgG level was 2670 mg/dL.  Her Kappa light chain is 4.55 mg/dL.  She feels fairly well.  She does have some fatigue.  She has had no problems with bowels or bladder.  She had her last mammogram back in November of this year.  The mammogram did not show any suspicious calcifications.  She has had no bony pain.  She has had no fevers, sweats, or chills. There has been no change in her medication.  PHYSICAL EXAMINATION:  General:  This is a mildly obese, African American female, in no obvious distress.  Vital Signs:  Temperature of 97.5, pulse 83, respiratory rate 16, blood pressure 127/60, and weight is 213 pounds.  Head and Neck:  Normocephalic, atraumatic skull.  There are no ocular or oral lesions.  There are no palpable cervical or supraclavicular lymph nodes.  Lungs:  Clear bilaterally.  Cardiac: Regular rate and rhythm with a normal S1 and S2.  There are no murmurs, rubs, or bruits.  Abdomen:  Soft.  She has good bowel sounds.  There is no palpable abdominal mass.  There is no palpable hepatosplenomegaly. Back:  No tenderness over the spine, ribs, or hips.  Extremities:  No clubbing, cyanosis or edema.  Neurological:  No focal neurological deficits.  LABORATORY STUDIES:  White cell count is 5.3, hemoglobin 10.8, hematocrit 33.3, and platelet count 179.  Ferritin is 106 with an iron saturation of 27%.  BUN is 20, creatinine 0.96.  Paraprotein monoclonal spike is 2.65 g/dL. IgG level was 3290 mg/dL.  Kappa light chain is 11.8  mg/dL.  IMPRESSION:  Barbara Cook is a very nice 58 year old African American female.  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  We have been following this for about 7 or 8 years.  I have noted that the monoclonal studies do show these M-spike to be increasing.  She is  a little  bit more anemic.  I am not sure if this is all related to the monoclonal spike.  I do not know if we have seen any kind of progression of her disease.  Of note, she had a skin biopsy done back in November.  This was negative for any malignancy.  I think that we have to be cautious here.  I think that we just need to see how things look when we see her back.  If we find that her studies are increasing, then we are going to have to probably get her set up with a bone marrow test.    ______________________________ [name], M.D. PRE/MEDQ  D:  04/18/2013  T:  04/19/2013  Job:  [id number]

## 2014-01-05 ENCOUNTER — Emergency Department (HOSPITAL_COMMUNITY)
Admission: EM | Admit: 2014-01-05 | Discharge: 2014-01-05 | Disposition: A | Discharge status: Home / Self Care | Payer: 59 | Attending: Emergency Medicine | Admitting: Emergency Medicine

## 2014-01-05 ENCOUNTER — Emergency Department (HOSPITAL_COMMUNITY): Payer: 59

## 2014-01-05 ENCOUNTER — Encounter (HOSPITAL_COMMUNITY): Payer: Self-pay | Admitting: Emergency Medicine

## 2014-01-05 DIAGNOSIS — S3981XA Other specified injuries of abdomen, initial encounter: Secondary | ICD-10-CM | POA: Insufficient documentation

## 2014-01-05 DIAGNOSIS — K589 Irritable bowel syndrome without diarrhea: Secondary | ICD-10-CM | POA: Insufficient documentation

## 2014-01-05 DIAGNOSIS — K219 Gastro-esophageal reflux disease without esophagitis: Secondary | ICD-10-CM | POA: Insufficient documentation

## 2014-01-05 DIAGNOSIS — X500XXA Overexertion from strenuous movement or load, initial encounter: Secondary | ICD-10-CM | POA: Diagnosis not present

## 2014-01-05 DIAGNOSIS — Z8742 Personal history of other diseases of the female genital tract: Secondary | ICD-10-CM | POA: Insufficient documentation

## 2014-01-05 DIAGNOSIS — Y9289 Other specified places as the place of occurrence of the external cause: Secondary | ICD-10-CM | POA: Insufficient documentation

## 2014-01-05 DIAGNOSIS — X503XXA Overexertion from repetitive movements, initial encounter: Secondary | ICD-10-CM | POA: Diagnosis not present

## 2014-01-05 DIAGNOSIS — IMO0002 Reserved for concepts with insufficient information to code with codable children: Secondary | ICD-10-CM | POA: Diagnosis not present

## 2014-01-05 DIAGNOSIS — Z87891 Personal history of nicotine dependence: Secondary | ICD-10-CM | POA: Insufficient documentation

## 2014-01-05 DIAGNOSIS — Z8639 Personal history of other endocrine, nutritional and metabolic disease: Secondary | ICD-10-CM | POA: Insufficient documentation

## 2014-01-05 DIAGNOSIS — Z862 Personal history of diseases of the blood and blood-forming organs and certain disorders involving the immune mechanism: Secondary | ICD-10-CM | POA: Insufficient documentation

## 2014-01-05 DIAGNOSIS — Z79899 Other long term (current) drug therapy: Secondary | ICD-10-CM | POA: Diagnosis not present

## 2014-01-05 DIAGNOSIS — M129 Arthropathy, unspecified: Secondary | ICD-10-CM | POA: Insufficient documentation

## 2014-01-05 DIAGNOSIS — Y9389 Activity, other specified: Secondary | ICD-10-CM | POA: Insufficient documentation

## 2014-01-05 DIAGNOSIS — R109 Unspecified abdominal pain: Secondary | ICD-10-CM

## 2014-01-05 LAB — URINALYSIS, ROUTINE W REFLEX MICROSCOPIC
BILIRUBIN URINE: NEGATIVE
Glucose, UA: NEGATIVE mg/dL
HGB URINE DIPSTICK: NEGATIVE
Ketones, ur: NEGATIVE mg/dL
Nitrite: NEGATIVE
PROTEIN: NEGATIVE mg/dL
Specific Gravity, Urine: 1.025 (ref 1.005–1.030)
UROBILINOGEN UA: 1 mg/dL (ref 0.0–1.0)
pH: 5.5 (ref 5.0–8.0)

## 2014-01-05 LAB — URINE MICROSCOPIC-ADD ON

## 2014-01-05 MED ORDER — ACETAMINOPHEN 325 MG PO TABS
650.0000 mg | ORAL_TABLET | Freq: Once | ORAL | Status: DC
  Filled 2014-01-05: qty 2

## 2014-01-05 MED ORDER — DIAZEPAM 5 MG PO TABS: 5.0000 mg | ORAL_TABLET | Freq: Two times a day (BID) | ORAL | Status: DC

## 2014-01-05 MED ORDER — DIAZEPAM 5 MG PO TABS
5.0000 mg | ORAL_TABLET | Freq: Once | ORAL | Status: AC
  Administered 2014-01-05: 5 mg via ORAL
  Filled 2014-01-05: qty 1

## 2014-01-05 NOTE — Discharge Instructions (Signed)
Today there was an abnormality on your chest XR which suggested pulmonary hypertension. It is important that you follow up about this. You will need additional testing as an outpatient. Your urine showed ca oxalate crystals in your urine. These could be coming from your kidneys. Follow up with your PCP about this. Return to the emergency department for new or worsening symptoms.

## 2014-01-05 NOTE — ED Notes (Signed)
Pt unable to urinate at this time. Water given.

## 2014-01-05 NOTE — ED Notes (Signed)
Urine collected, sent to lab

## 2014-01-05 NOTE — ED Provider Notes (Signed)
CSN: [id number]     Arrival date & time 01/05/14  0910 History   First MD Initiated Contact with Patient 01/05/14 [phone]     Chief Complaint  Patient presents with  . Flank Pain    (Consider location/radiation/quality/duration/timing/severity/associated sxs/prior Treatment) HPI Comments: Patient is a 58 year old female with history of polymyalgia rheumatica, endometriosis who presents to the emergency department today with right-sided flank pain. She reports that the pain began after trying to get into a tall SUV and pulling her luggage 2 days ago. It is a tight, twisting pain. Her pain is constant. She took Tylenol with some relief of her symptoms. Moving makes her pain worse. She denies chest pain, shortness of breath, dysuria, urinary urgency, urinary frequency, fever, chills. No nausea, vomiting, abdominal pain. She does not have any history of kidney stones.  The history is provided by the patient. No language interpreter was used.    Past Medical History  Diagnosis Date  . Arthropathy, unspecified, site unspecified   . GERD (gastroesophageal reflux disease)   . Arthritis   . MGUS (monoclonal gammopathy of unknown significance) 08-28-202013  . Endometriosis   . Fibroid   . Polymyalgia rheumatica   . IBS (irritable bowel syndrome)    Past Surgical History  Procedure Laterality Date  . Hernia repair    . Tubal ligation    . Neck surgery    . Fistula repair surg     Family History  Problem Relation Age of Onset  . Lung cancer Maternal Uncle   . Throat cancer Maternal Aunt   . Diabetes Maternal Grandmother   . Hypertension Maternal Grandmother   . Heart disease Maternal Grandmother   . Heart disease Paternal Grandmother   . Colon cancer Neg Hx   . Heart disease Mother   . Rheum arthritis Mother    History  Substance Use Topics  . Smoking status: Former Smoker    Types: Cigarettes  . Smokeless tobacco: Never Used     Comment: Quit 15 years ago.  . Alcohol Use: No   OB  History   Grav Para Term Preterm Abortions TAB SAB Ect Mult Living   1 1 1       1      Review of Systems  Constitutional: Negative for fever and chills.  Respiratory: Negative for shortness of breath.   Cardiovascular: Negative for chest pain.  Gastrointestinal: Negative for nausea, vomiting and abdominal pain.  Genitourinary: Positive for flank pain. Negative for dysuria and frequency.  All other systems reviewed and are negative.     Allergies  Codeine  Home Medications   Prior to Admission medications   Medication Sig Start Date End Date Taking? Authorizing Provider  acetaminophen (TYLENOL) 650 MG CR tablet Take 650 mg by mouth every 8 (eight) hours as needed.      Historical Provider, MD  Bisacodyl (DULCOLAX PO) Take by mouth. As needed     Historical Provider, MD  Calcium Carbonate-Vit D-Min (CALCIUM 1200 PO) Take by mouth every morning.    Historical Provider, MD  Cholecalciferol (VITAMIN D) 1000 UNITS capsule Take 1,000 Units by mouth daily.     Historical Provider, MD  Magnesium 250 MG TABS Take by mouth every morning.    Historical Provider, MD  Multiple Vitamin (MULTIVITAMIN) tablet Take 1 tablet by mouth daily.    Historical Provider, MD  Omeprazole Magnesium (PRILOSEC OTC PO) Take by mouth.    Historical Provider, MD  potassium chloride (KLOR-CON) 8 MEQ CR tablet  Take 8 mEq by mouth daily.      Historical Provider, MD  predniSONE (DELTASONE) 5 MG tablet Take 7 mg by mouth daily.     Historical Provider, MD  spironolactone (ALDACTONE) 25 MG tablet Take 25 mg by mouth daily.      Historical Provider, MD  torsemide (DEMADEX) 20 MG tablet Take 20 mg by mouth daily.      Historical Provider, MD   BP 135/54  Pulse 82  Temp(Src) 97.7 F (36.5 C) (Oral)  Resp 16  SpO2 99% Physical Exam  Nursing note and vitals reviewed. Constitutional: She is oriented to person, place, and time. She appears well-developed and well-nourished. No distress.  HENT:  Head: Normocephalic  and atraumatic.  Right Ear: External ear normal.  Left Ear: External ear normal.  Nose: Nose normal.  Mouth/Throat: Oropharynx is clear and moist.  Eyes: Conjunctivae are normal.  Neck: Normal range of motion.  Cardiovascular: Normal rate, regular rhythm and normal heart sounds.   Pulmonary/Chest: Effort normal and breath sounds normal. No stridor. No respiratory distress. She has no wheezes. She has no rales.    Abdominal: Soft. She exhibits no distension. There is no tenderness. There is no rigidity, no rebound, no guarding, no tenderness at McBurney's point and negative Murphy's sign.    Musculoskeletal: Normal range of motion.  Neurological: She is alert and oriented to person, place, and time. She has normal strength.  Skin: Skin is warm and dry. She is not diaphoretic. No erythema.  No rashes or lesions  Psychiatric: She has a normal mood and affect. Her behavior is normal.    ED Course  Procedures (including critical care time) Labs Review Labs Reviewed  URINALYSIS, ROUTINE W REFLEX MICROSCOPIC - Abnormal; Notable for the following:    Color, Urine AMBER (*)    APPearance CLOUDY (*)    Leukocytes, UA SMALL (*)    All other components within normal limits  URINE MICROSCOPIC-ADD ON - Abnormal; Notable for the following:    Bacteria, UA FEW (*)    Crystals CA OXALATE CRYSTALS (*)    All other components within normal limits    Imaging Review Dg Ribs Unilateral W/chest Right  01/05/2014   CLINICAL DATA:  Right low anterior rib pain, no injury  EXAM: RIGHT RIBS AND CHEST - 3+ VIEW  COMPARISON:  Prior chest CT 04/18/2012  FINDINGS: The heart is within normal limits for size. Ovoid soft tissue density in the expected location of the azygos vein to the right of the lower trachea and just above the right mainstem bronchus. Correlation with the scout film from the prior CT scan of the chest demonstrates a similar appearance. Inspiratory volumes are low. Ill-defined left  retrocardiac opacity likely reflects a combination of superimposition of overlying soft tissues and subsegmental atelectasis. No pulmonary edema, pneumothorax or focal airspace consolidation.  No acute osseous abnormality. Specifically, no rib fracture is identified.  IMPRESSION: 1. Negative for evidence of acute rib fracture or osseous lesion. 2. Prominence of the lower right paratracheal soft tissues in the expected region of the azygos vein may represent pulmonary venous hypertension/congestion. Correlation with the scout images from the chest CT dated 04/18/2012 demonstrates a similar appearance and therefore this is not favored to represent paratracheal adenopathy. 3. Left lower lobe subsegmental atelectasis.   Electronically Signed   By: Jacqulynn Cadet M.D.   On: 01/05/2014 10:12     EKG Interpretation None      MDM   Final diagnoses:  Right flank pain    Patient presents to our emergency department for evaluation of breath flank pain. This is likely musculoskeletal in nature. It began after trying to get into a tall SUV and is worse with movement. No chest pain or shortness of breath. No pleuritic pain. X-ray does show prominence of the right paratracheal soft tissues which may represent pulmonary venous hypertension/congestion. The patient is unaware of this diagnosis. She was given referral to pulmonology. She was encouraged to get an echo. She will followup with her primary care physician. Patient was given Valium as a muscle relaxer. Discussed case with Dr. Kathrynn Humble who agrees with plan. Discussed reasons to return to emergency Department immediately. Vital signs stable for discharge. Patient / Family / Caregiver informed of clinical course, understand medical decision-making process, and agree with plan.     Elwyn Lade, PA-C 01/05/14 1626

## 2014-01-05 NOTE — ED Notes (Signed)
Pt from home c/o r side pain after trying to get into a tall SUV and pulling luggage 2 days ago. Pt thinks that she may have pulled a muscle. Pt denies N/V, fever, CP, SOB. Pt reports pain all the time but worsens with turning. Pt is A&O and in NAD

## 2014-01-05 NOTE — ED Notes (Signed)
Urine sample was not collected yet. Patient will try again soon.

## 2014-01-07 ENCOUNTER — Ambulatory Visit (INDEPENDENT_AMBULATORY_CARE_PROVIDER_SITE_OTHER): Payer: 59 | Admitting: Neurology

## 2014-01-07 ENCOUNTER — Encounter: Payer: Self-pay | Admitting: Neurology

## 2014-01-07 VITALS — BP 121/64 | HR 91 | Ht 64.5 in | Wt 224.0 lb

## 2014-01-07 DIAGNOSIS — R269 Unspecified abnormalities of gait and mobility: Secondary | ICD-10-CM

## 2014-01-07 DIAGNOSIS — M353 Polymyalgia rheumatica: Secondary | ICD-10-CM

## 2014-01-07 NOTE — Progress Notes (Signed)
[facility]  PATIENT: Barbara Cook DOB: 1955/06/25  HISTORICAL  Barbara Cook is a 58 yo RH AAF, referred by her Rheumatologist Dr. Amil Cook and primary care Dr. Shelia Cook for evaluation of left lower extremity weakness.  She had a past medical history of obesity, also carried a diagnosis of polymyalgia rheumatica, she presented with left leg weakness, gait difficulty also bilateral lower extremity stiffness, anterior thigh muscle achyness around2009, she had extensive evaluation at that time, MRI cervical spine showed  mild progression of multilevel disc protrusions resulting in mild spinal stenosis at C3-C4, C4-C5 and C6-C7. No associated spinal cord signal abnormality.   MRI brain and thoracic, lumbar spine was essentially normal.  Over the years, her symptoms persistent, there was no significant worsening    In 2011, she caught cold, coughing, she complains of severe neck pain, was told that she had  osteomyelitis, patient had cervical spine fusion by  Barbara Cook, at [facility]  which has helped her neck pain, but she continued to have bilateral anterior thigh muscle tightness specificity, left leg muscle weakness,     She  denies trouble with bowel, bladder, she was treated with prednisone, for diagnosis of possible polymyalgia rheumatica,  with no sginficant improvement.  CT cervical in 2012 showed status post fusion at C4-5 without to evidence for residual or recurrent stenosis. The graft appears well fused at both the C4 and C5 levels. uncovertebral spurring with residual foraminal stenosis bilaterally at C3-4, worse on the left. Mild uncovertebral disease at C5-6 and C6-7 without significant stenosis at either level.  UPDATE January 07 2014:  MRI brain in Nov 2014 at [facility], showed mild small vessel disease.    She had one episodes in Spring of 2015, she was sitting at desk, she had sudden onset of vertigo, room spinning, last 3 second, no LOC, no headaches  afterwards. She denies a previous history of seizure,  She was on higher dose of prednisone for a while , 15mg  qday for few months, her sypmtoms have improved, she is now tapering down to 6 mg qday, her symptoms come backs, She complains of waist and thigh muscle, tightness, involving both legs, no gait difficulty, she denies incontinence, no feet paresthesia.   REVIEW OF SYSTEMS: Full 14 system review of systems performed and notable only for weakness, swelling in legs, decreased energy, achy muscles  ALLERGIES: Allergies  Allergen Reactions  . Codeine Palpitations    HOME MEDICATIONS: Outpatient Prescriptions Prior to Visit  Medication Sig Dispense Refill  . acetaminophen (TYLENOL) 650 MG CR tablet Take 1,300 mg by mouth every 8 (eight) hours as needed for pain.       . Calcium Carbonate-Vit D-Min (CALCIUM 1200 PO) Take by mouth every morning.      . Cholecalciferol (VITAMIN D) 1000 UNITS capsule Take 1,000 Units by mouth daily.       . diazepam (VALIUM) 5 MG tablet Take 1 tablet (5 mg total) by mouth 2 (two) times daily.  10 tablet  0  . Magnesium 250 MG TABS Take by mouth every morning.      . Multiple Vitamin (MULTIVITAMIN) tablet Take 1 tablet by mouth daily.      . potassium chloride (KLOR-CON) 8 MEQ CR tablet Take 8 mEq by mouth daily.        . predniSONE (DELTASONE) 1 MG tablet Take 1 mg by mouth daily with breakfast. Takes with 5mg  tablet to have a total of 6mg  daily      .  predniSONE (DELTASONE) 5 MG tablet Take 5 mg by mouth daily. Takes with 1mg  tablet to have a total of 6mg  daily      . spironolactone (ALDACTONE) 25 MG tablet Take 50 mg by mouth daily.       Marland Kitchen torsemide (DEMADEX) 20 MG tablet Take 40 mg by mouth daily.        No facility-administered medications prior to visit.    PAST MEDICAL HISTORY: Past Medical History  Diagnosis Date  . Arthropathy, unspecified, site unspecified   . GERD (gastroesophageal reflux disease)   . Arthritis   . MGUS (monoclonal  gammopathy of unknown significance) June 10, 202013  . Endometriosis   . Fibroid   . Polymyalgia rheumatica     PAST SURGICAL HISTORY: Past Surgical History  Procedure Laterality Date  . Hernia repair    . Tubal ligation    . Neck surgery    . Fistula repair surg      FAMILY HISTORY: Family History  Problem Relation Age of Onset  . Lung cancer Maternal Uncle   . Throat cancer Maternal Aunt   . Diabetes Maternal Grandmother   . Hypertension Maternal Grandmother   . Heart disease Maternal Grandmother   . Heart disease Paternal Grandmother   . Colon cancer Neg Hx   . Heart disease Mother     SOCIAL HISTORY:  History   Social History  . Marital Status: Married    Spouse Name: Barbara Cook    Number of Children: 1  . Years of Education: 12   Occupational History  . [employer]   Social History Main Topics  . Smoking status: Former Smoker    Types: Cigarettes  . Smokeless tobacco: Never Used     Comment: Quit 15 years ago.  . Alcohol Use: No  . Drug Use: No  . Sexual Activity: Yes    Birth Control/ Protection: Surgical   Social History Narrative   Patient lives at home with her husband Barbara Cook). Patient works full time.   Education- High school   Right handed.   Caffeine- None   PHYSICAL EXAM   Filed Vitals:   01/07/14 1125  BP: 121/64  Pulse: 91  Height: 5' 4.5" (1.638 m)  Weight: 224 lb (101.606 kg)    Not recorded    Body mass index is 37.87 kg/(m^2).   Generalized: In no acute distress  Neck: Supple, no carotid bruits   Cardiac: Regular rate rhythm  Pulmonary: Clear to auscultation bilaterally  Musculoskeletal: No deformity  Neurological examination  Mentation: Alert oriented to time, place, history taking, and causual conversation, obesity  Cranial nerve II-XII: Pupils were equal round reactive to light extraocular movements were full, visual field were full on confrontational test. facial sensation and strength were  normal. hearing was intact to finger rubbing bilaterally. Uvula tongue midline.  head turning and shoulder shrug and were normal and symmetric.Tongue protrusion into cheek strength was normal.  Motor: I did not see any significant weakness today, both upper, and lower extremity, proximal and distal strength were normal  Sensory: Intact to light touch, pinprick, preserved vibratory sensation, and proprioception at toes.  Coordination: Normal finger to nose, heel-to-shin bilaterally there was no truncal ataxia  Gait: wide based, cautious, unsteady gait, she has trouble with tiptoe, heel and tandem walking   Romberg signs: Negative  Deep tendon reflexes: Brachioradialis 2/3, biceps 2/3, triceps 2/2, patellar trace Achilles trace, plantar responses were exensor bilaterally.   DIAGNOSTIC DATA (LABS, IMAGING, TESTING) -  I reviewed patient records, labs, notes, testing and imaging myself where available.  Lab Results  Component Value Date   WBC 7.0 12/10/2013   HGB 10.8* 12/10/2013   HCT 32.3* 12/10/2013   MCV 92 12/10/2013   PLT 174 12/10/2013      Component Value Date/Time   NA 138 12/10/2013 1351   K 3.5 12/10/2013 1351   CL 101 12/10/2013 1351   CO2 31 12/10/2013 1351   GLUCOSE 147* 12/10/2013 1351   BUN 24* 12/10/2013 1351   CREATININE 0.99 12/10/2013 1351   CALCIUM 9.1 12/10/2013 1351   PROT 8.1 12/10/2013 1351   ALBUMIN 3.6 12/10/2013 1351   AST 30 12/10/2013 1351   ALT 28 12/10/2013 1351   ALKPHOS 44 12/10/2013 1351   BILITOT 0.3 12/10/2013 1351   ASSESSMENT AND PLAN   58 years old right-handed [place] American female, with a long-standing history of left leg proximal weakness, bilateral lower extremity muscle achy, specificity, hyperreflexia of the left upper extremity, bilateral Babinski signs, MRI of the brain showed small vessel disease, no acute lesions, previous cervical decompression surgery, for cervical osteomyelitis, also carry a diagnosis of polymyalgia rheumatica, her symptoms improved with  higher dose of prednisone, her complains of bilateral lower extremity aching pain, mild gait difficulty likely due to a combination of deconditioning, joints pain, polymyalgia rheumatica I have encouraged her to continue moderate exercise, return to clinic for new issues      Marcial Pacas, M.D. Ph.D.  [facility] [address], [place], [place] [address] [phone]

## 2014-01-11 ENCOUNTER — Ambulatory Visit (INDEPENDENT_AMBULATORY_CARE_PROVIDER_SITE_OTHER): Payer: 59 | Admitting: Emergency Medicine

## 2014-01-11 ENCOUNTER — Encounter: Payer: Self-pay | Admitting: Emergency Medicine

## 2014-01-11 VITALS — BP 118/76 | HR 86 | Ht 64.0 in | Wt 222.0 lb

## 2014-01-11 DIAGNOSIS — J9851 Mediastinitis: Secondary | ICD-10-CM

## 2014-01-11 DIAGNOSIS — R9389 Abnormal findings on diagnostic imaging of other specified body structures: Secondary | ICD-10-CM

## 2014-01-11 NOTE — Progress Notes (Signed)
Subjective:    Patient ID: Barbara Cook, female    DOB: 04/25/1956, 58 y.o.   MRN: [id number]  HPI 58 yo woman, minimal tobacco exposure w hx PMR, MGUS, IBS, endometriosis. She was seen in the ED for R flank pain, underwent a CXR that showed suspected enlargement of her R main PA vs mediastinal enlargement. She was referred for eval. She feels well. Denies CP. She still has R flank soreness. She is on chronic prednisone for the last year for PMR.    Review of Systems  Constitutional: Negative for fever and unexpected weight change.  HENT: Negative for congestion, dental problem, ear pain, nosebleeds, postnasal drip, rhinorrhea, sinus pressure, sneezing, sore throat and trouble swallowing.   Eyes: Negative for redness and itching.  Respiratory: Positive for shortness of breath. Negative for cough, chest tightness and wheezing.   Cardiovascular: Positive for leg swelling. Negative for palpitations.  Gastrointestinal: Negative for nausea and vomiting.  Genitourinary: Negative for dysuria.  Musculoskeletal: Negative for joint swelling.  Skin: Negative for rash.  Neurological: Negative for headaches.  Hematological: Does not bruise/bleed easily.  Psychiatric/Behavioral: Negative for dysphoric mood. The patient is not nervous/anxious.    Past Medical History  Diagnosis Date  . Arthropathy, unspecified, site unspecified   . GERD (gastroesophageal reflux disease)   . Arthritis   . MGUS (monoclonal gammopathy of unknown significance) 2020-07-711  . Endometriosis   . Fibroid   . Polymyalgia rheumatica   . IBS (irritable bowel syndrome)      Family History  Problem Relation Age of Onset  . Lung cancer Maternal Uncle   . Throat cancer Maternal Aunt   . Diabetes Maternal Grandmother   . Hypertension Maternal Grandmother   . Heart disease Maternal Grandmother   . Heart disease Paternal Grandmother   . Colon cancer Neg Hx   . Heart disease Mother   . Rheum arthritis Mother       History   Social History  . Marital Status: Married    Spouse Name: [name]    Number of Children: 1  . Years of Education: 12   Occupational History  . [employer]   Social History Main Topics  . Smoking status: Former Smoker -- 0.20 packs/day for 2 years    Types: Cigarettes    Quit date: 01/11/1994  . Smokeless tobacco: Never Used  . Alcohol Use: No  . Drug Use: No  . Sexual Activity: Yes    Birth Control/ Protection: Surgical     Comment: BTL   Other Topics Concern  . Not on file   Social History Narrative   Patient lives at home with her husband [name]). Patient works full time.   Education- High school   Right handed.   Caffeine- None     Allergies  Allergen Reactions  . Codeine Palpitations     Outpatient Prescriptions Prior to Visit  Medication Sig Dispense Refill  . acetaminophen (TYLENOL) 650 MG CR tablet Take 1,300 mg by mouth every 8 (eight) hours as needed for pain.       . Calcium Carbonate-Vit D-Min (CALCIUM 1200 PO) Take by mouth every morning.      . Cholecalciferol (VITAMIN D) 1000 UNITS capsule Take 1,000 Units by mouth daily.       . Magnesium 250 MG TABS Take by mouth every morning.      . Multiple Vitamin (MULTIVITAMIN) tablet Take 1 tablet by mouth daily.      . potassium  chloride (KLOR-CON) 8 MEQ CR tablet Take 8 mEq by mouth daily.        . predniSONE (DELTASONE) 1 MG tablet Take 1 mg by mouth daily with breakfast. Takes with 5mg  tablet to have a total of 6mg  daily      . predniSONE (DELTASONE) 5 MG tablet Take 5 mg by mouth daily. Takes with 1mg  tablet to have a total of 6mg  daily      . spironolactone (ALDACTONE) 25 MG tablet Take 50 mg by mouth daily.       Marland Kitchen torsemide (DEMADEX) 20 MG tablet Take 40 mg by mouth daily.       . diazepam (VALIUM) 5 MG tablet Take 1 tablet (5 mg total) by mouth 2 (two) times daily.  10 tablet  0   No facility-administered medications prior to visit.         Objective:    Physical Exam Filed Vitals:   01/11/14 1535  BP: 118/76  Pulse: 86  Height: 5\' 4"  (1.626 m)  Weight: 222 lb (100.699 kg)  SpO2: 99%   Gen: Pleasant, overweight woman, in no distress,  normal affect  ENT: No lesions,  mouth clear,  oropharynx clear, no postnasal drip  Neck: No JVD, no TMG, no carotid bruits  Lungs: No use of accessory muscles, clear without rales or rhonchi  Cardiovascular: RRR, heart sounds normal, no murmur or gallops, trace pretibial peripheral edema  Musculoskeletal: No deformities, no cyanosis or clubbing  Neuro: alert, non focal  Skin: Warm, no lesions or rashes       Assessment & Plan:  Abnormal CXR Recent chest x-ray in the emergency department that showed a prominent right mediastinum and hilum. It was speculated that this represented an enlarged right main pulmonary artery and associated pulmonary hypertension. While this is possible, the patient does not have any clinical history to suggest the same. She does have a CT scan from 2013 that showed a prominent right mediastinal node, 7 mm. It is unclear whether the current chest x-ray abnormality represents soft tissue or vascular structure. - We'll perform CT scan of the chest to better characterize her right mediastinum and hilum. - Will perform a cardiogram to screen for evidence of pulmonary hypertension - followup next available

## 2014-01-11 NOTE — Assessment & Plan Note (Signed)
Recent chest x-ray in the emergency department that showed a prominent right mediastinum and hilum. It was speculated that this represented an enlarged right main pulmonary artery and associated pulmonary hypertension. While this is possible, the patient does not have any clinical history to suggest the same. She does have a CT scan from 2013 that showed a prominent right mediastinal node, 7 mm. It is unclear whether the current chest x-ray abnormality represents soft tissue or vascular structure. - We'll perform CT scan of the chest to better characterize her right mediastinum and hilum. - Will perform a cardiogram to screen for evidence of pulmonary hypertension - followup next available

## 2014-01-11 NOTE — Patient Instructions (Signed)
We will perform a CT scan of the chest We will perform an echocardiogram Please follow with Dr. Lamonte Sakai next available after these tests are completed

## 2014-01-12 NOTE — ED Provider Notes (Signed)
Medical screening examination/treatment/procedure(s) were conducted as a shared visit with non-physician practitioner(s) and myself.  I personally evaluated the patient during the encounter.   EKG Interpretation None      Right lower thoracic pain, posterior. Pt's pain was provoked with some lifting and moving. Pain is reproducible. Will get Xrays, if neg, this will be assumed to be a muscular strain.   Varney Biles, MD 01/12/14 0930

## 2014-01-18 ENCOUNTER — Encounter: Payer: Self-pay | Admitting: Oncology

## 2014-01-18 ENCOUNTER — Ambulatory Visit (HOSPITAL_COMMUNITY): Payer: 59 | Attending: Emergency Medicine

## 2014-01-18 ENCOUNTER — Other Ambulatory Visit (HOSPITAL_COMMUNITY): Payer: 59

## 2014-01-18 ENCOUNTER — Ambulatory Visit (INDEPENDENT_AMBULATORY_CARE_PROVIDER_SITE_OTHER)
Admission: RE | Admit: 2014-01-18 | Discharge: 2014-01-18 | Disposition: A | Discharge status: Home / Self Care | Payer: 59 | Source: Ambulatory Visit | Attending: Emergency Medicine | Admitting: Emergency Medicine

## 2014-01-18 DIAGNOSIS — Z87891 Personal history of nicotine dependence: Secondary | ICD-10-CM | POA: Diagnosis not present

## 2014-01-18 DIAGNOSIS — M129 Arthropathy, unspecified: Secondary | ICD-10-CM | POA: Insufficient documentation

## 2014-01-18 DIAGNOSIS — K589 Irritable bowel syndrome without diarrhea: Secondary | ICD-10-CM | POA: Insufficient documentation

## 2014-01-18 DIAGNOSIS — K219 Gastro-esophageal reflux disease without esophagitis: Secondary | ICD-10-CM | POA: Insufficient documentation

## 2014-01-18 DIAGNOSIS — M353 Polymyalgia rheumatica: Secondary | ICD-10-CM | POA: Diagnosis not present

## 2014-01-18 DIAGNOSIS — N809 Endometriosis, unspecified: Secondary | ICD-10-CM | POA: Insufficient documentation

## 2014-01-18 DIAGNOSIS — J9851 Mediastinitis: Secondary | ICD-10-CM | POA: Insufficient documentation

## 2014-01-18 MED ORDER — IOHEXOL 300 MG/ML  SOLN
80.0000 mL | Freq: Once | INTRAMUSCULAR | Status: AC | PRN
  Administered 2014-01-18: 80 mL via INTRAVENOUS

## 2014-01-18 NOTE — Progress Notes (Signed)
Discussed CT findings with patient at approx. 9PM 9/11.  She has no back pain. She knows to call for pain or neurologic symptoms. She will call 9/14 for an appt. With Dr. Marin Olp.

## 2014-01-18 NOTE — Progress Notes (Signed)
2D Echo completed. 01/18/2014 

## 2014-01-22 ENCOUNTER — Encounter: Payer: Self-pay | Admitting: Emergency Medicine

## 2014-01-22 ENCOUNTER — Ambulatory Visit (INDEPENDENT_AMBULATORY_CARE_PROVIDER_SITE_OTHER): Payer: 59 | Admitting: Emergency Medicine

## 2014-01-22 VITALS — BP 128/86 | HR 75 | Ht 64.0 in | Wt 220.0 lb

## 2014-01-22 DIAGNOSIS — D472 Monoclonal gammopathy: Secondary | ICD-10-CM

## 2014-01-22 DIAGNOSIS — R9389 Abnormal findings on diagnostic imaging of other specified body structures: Secondary | ICD-10-CM

## 2014-01-22 NOTE — Patient Instructions (Signed)
Your CT scan and echocardiogram do not show any evidence for pulmonary hypertension.  You Ct scan does show evidence for another problem called Multiple Myeloma, a blood cell problem that acts as a low-grade cancer. We will work to get you an appointment with Dr Marin Olp as soon as possible to assess this.

## 2014-01-22 NOTE — Assessment & Plan Note (Signed)
Unfortunately her CT scan shows lytic lesions that in setting of MGUS are almost certainly Multiple Myeloma. Not clear to me that these are responsible for her flank pain, but she needs to be seen by Dr Marin Olp asap. Will arrange for this,.

## 2014-01-22 NOTE — Assessment & Plan Note (Signed)
No evidence of PAH on TTE or CT scan. The area that was enlarged on her CXR is a R paratracheal node.

## 2014-01-22 NOTE — Progress Notes (Signed)
Subjective:    Patient ID: Barbara Cook, female    DOB: Apr 01, 1956, 58 y.o.   MRN: [id number]  HPI 58 yo woman, minimal tobacco exposure w hx PMR, MGUS, IBS, endometriosis. She was seen in the ED for R flank pain, underwent a CXR that showed suspected enlargement of her R main PA vs mediastinal enlargement. She was referred for eval. She feels well. Denies CP. She still has R flank soreness. She is on chronic prednisone for the last year for PMR.   ROV 01/22/14 -- follow up visit to evaluate for possible PAH noted due to R hilar fullness on CXR . CT chest 9/11 confirms no vascular abnormality, stable R paratracheal node. TTE performed > no evidence PAH.     Review of Systems  Constitutional: Negative for fever and unexpected weight change.  HENT: Negative for congestion, dental problem, ear pain, nosebleeds, postnasal drip, rhinorrhea, sinus pressure, sneezing, sore throat and trouble swallowing.   Eyes: Negative for redness and itching.  Respiratory: Positive for shortness of breath. Negative for cough, chest tightness and wheezing.   Cardiovascular: Positive for leg swelling. Negative for palpitations.  Gastrointestinal: Negative for nausea and vomiting.  Genitourinary: Negative for dysuria.  Musculoskeletal: Negative for joint swelling.  Skin: Negative for rash.  Neurological: Negative for headaches.  Hematological: Does not bruise/bleed easily.  Psychiatric/Behavioral: Negative for dysphoric mood. The patient is not nervous/anxious.    Past Medical History  Diagnosis Date  . Arthropathy, unspecified, site unspecified   . GERD (gastroesophageal reflux disease)   . Arthritis   . MGUS (monoclonal gammopathy of unknown significance) Jul 21, 202013  . Endometriosis   . Fibroid   . Polymyalgia rheumatica   . IBS (irritable bowel syndrome)      Family History  Problem Relation Age of Onset  . Lung cancer Maternal Uncle   . Throat cancer Maternal Aunt   . Diabetes Maternal  Grandmother   . Hypertension Maternal Grandmother   . Heart disease Maternal Grandmother   . Heart disease Paternal Grandmother   . Colon cancer Neg Hx   . Heart disease Mother   . Rheum arthritis Mother      History   Social History  . Marital Status: Married    Spouse Name: [name]    Number of Children: 1  . Years of Education: 12   Occupational History  . [employer]   Social History Main Topics  . Smoking status: Former Smoker -- 0.20 packs/day for 2 years    Types: Cigarettes    Quit date: 01/11/1994  . Smokeless tobacco: Never Used  . Alcohol Use: No  . Drug Use: No  . Sexual Activity: Yes    Birth Control/ Protection: Surgical     Comment: BTL   Other Topics Concern  . Not on file   Social History Narrative   Patient lives at home with her husband [name]). Patient works full time.   Education- High school   Right handed.   Caffeine- None     Allergies  Allergen Reactions  . Codeine Palpitations     Outpatient Prescriptions Prior to Visit  Medication Sig Dispense Refill  . acetaminophen (TYLENOL) 650 MG CR tablet Take 1,300 mg by mouth every 8 (eight) hours as needed for pain.       . Calcium Carbonate-Vit D-Min (CALCIUM 1200 PO) Take by mouth every morning.      . Cholecalciferol (VITAMIN D) 1000 UNITS capsule Take 1,000 Units by mouth  daily.       . Magnesium 250 MG TABS Take by mouth every morning.      . Multiple Vitamin (MULTIVITAMIN) tablet Take 1 tablet by mouth daily.      . potassium chloride (KLOR-CON) 8 MEQ CR tablet Take 8 mEq by mouth daily.        . predniSONE (DELTASONE) 1 MG tablet Take 1 mg by mouth daily with breakfast. Takes with 16m tablet to have a total of 673mdaily      . predniSONE (DELTASONE) 5 MG tablet Take 5 mg by mouth daily. Takes with 17m4mablet to have a total of 6mg72mily      . spironolactone (ALDACTONE) 25 MG tablet Take 50 mg by mouth daily.       . toMarland Kitchensemide (DEMADEX) 20 MG tablet Take 40 mg by  mouth daily.        No facility-administered medications prior to visit.         Objective:   Physical Exam Filed Vitals:   01/22/14 1632  BP: 128/86  Pulse: 75  Height: 5' 4"  (1.626 m)  Weight: 220 lb (99.791 kg)  SpO2: 95%   Gen: Pleasant, overweight woman, in no distress,  normal affect  ENT: No lesions,  mouth clear,  oropharynx clear, no postnasal drip  Neck: No JVD, no TMG, no carotid bruits  Lungs: No use of accessory muscles, clear without rales or rhonchi  Cardiovascular: RRR, heart sounds normal, no murmur or gallops, trace pretibial peripheral edema  Musculoskeletal: No deformities, no cyanosis or clubbing  Neuro: alert, non focal  Skin: Warm, no lesions or rashes    CT chest 01/18/14 --  COMPARISON: 04/18/2012  FINDINGS:  There is no pleural effusion identified. Pulmonary nodule in the  left lower lobe is again identified and is stable measuring 6 mm,  image 33/series 3. No new pulmonary nodules identified.  The heart size appears normal. There is no pericardial effusion. The  trachea appears normal and midline. Normal appearance of the  esophagus. Again noted are several enlarged low-attenuation right  paratracheal region lymph nodes. These measure up to 1.2 cm.  Hounsfield units measure up to 11. When compared with the previous  exam the appearance is unchanged. No axillary or supraclavicular  adenopathy.  Incidental imaging through the upper abdomen is unremarkable. The  adrenal glands and visualized portions of the liver, pancreas,  gallbladder and spleen appear normal.  Review of the visualized osseous structures is remarkable for a  large lucent lesion involving the T12 vertebra. This measures 2.4 cm  and there appears to be a pathologic fracture through the inferior  endplate of the T12 Q73tebra. There is a 8 mm lytic lesion within  the inferior endplate of T10.A19aller lucent lesion is identified  within the sternum measuring 8 mm. This is  also new from previous  exam.  IMPRESSION:  1. Stable appearance of low-attenuation, enlarged right paratracheal  lymph nodes when compared with 04/18/2012.  2. New multi focal lucent lesions are identified within the bony  thorax. The largest involves the T12 vertebra with there is a  pathologic fracture in through the inferior endplate. According to  the patient's medical record the patient has a monoclonal gammopathy  of unknown significance. These findings may be a manifestation of  myeloma. The T12 lesion is of concern as it may be of neurosurgical  significance.    TTE 01/18/14 --  Study Conclusions - Left ventricle: The cavity size was  normal. Wall thickness was normal. Systolic function was normal. The estimated ejection fraction was in the range of 60% to 65%. Wall motion was normal; there were no regional wall motion abnormalities. Left ventricular diastolic function parameters were normal. - Aortic valve: There was no stenosis. - Mitral valve: There was no significant regurgitation. - Right ventricle: The cavity size was normal. Systolic function was normal. - Tricuspid valve: Peak RV-RA gradient (S): 25 mm Hg. - Pulmonary arteries: PA peak pressure: 28 mm Hg (S). - Inferior vena cava: The vessel was normal in size. The respirophasic diameter changes were in the normal range (>= 50%), consistent with normal central venous pressure. Impressions: - Normal LV size and systolic function, EF 66-06%. Normal diastolic function. Normal RV size and systolic function. No significant valvular abnormalities.      Assessment & Plan:  MGUS (monoclonal gammopathy of unknown significance) Unfortunately her CT scan shows lytic lesions that in setting of MGUS are almost certainly Multiple Myeloma. Not clear to me that these are responsible for her flank pain, but she needs to be seen by Dr Marin Olp asap. Will arrange for this,.   Abnormal CXR No evidence of PAH on TTE or CT scan.  The area that was enlarged on her CXR is a R paratracheal node.

## 2014-01-23 ENCOUNTER — Telehealth: Payer: Self-pay | Admitting: Emergency Medicine

## 2014-01-23 NOTE — Telephone Encounter (Signed)
Per 9/15:   Patient Instructions      Your CT scan and echocardiogram do not show any evidence for pulmonary hypertension.   You Ct scan does show evidence for another problem called Multiple Myeloma, a blood cell problem that acts as a low-grade cancer. We will work to get you an appointment with Dr Marin Olp as soon as possible to assess this.   --- LVM for Dr. Antonieta Pert scheduler, Liliane Channel, that this Referral is in Epic & left my direct # to call back for questions Dawne J Law --  Called spoke with pt. Aware we are working on trying to get her an appt. Please advise PCC's thanks

## 2014-01-24 NOTE — Telephone Encounter (Signed)
Spoke with patient & advised I had called back to Dr. Antonieta Pert office this morning.  I was told that Barbara Cook is out sick but pt's info was taken & is going to be given to Dr. Antonieta Pert nurse Barbara Cook.  I advised pt that Dr. Antonieta Pert office may call her directly with an appt but if they call us, we will let her know.  Pt voiced understanding Barbara Cook

## 2014-01-29 ENCOUNTER — Telehealth: Payer: Self-pay | Admitting: Hematology & Oncology

## 2014-01-29 ENCOUNTER — Ambulatory Visit (HOSPITAL_BASED_OUTPATIENT_CLINIC_OR_DEPARTMENT_OTHER): Payer: 59 | Admitting: Hematology & Oncology

## 2014-01-29 ENCOUNTER — Other Ambulatory Visit: Payer: Self-pay | Admitting: Oncology

## 2014-01-29 ENCOUNTER — Encounter: Payer: Self-pay | Admitting: Hematology & Oncology

## 2014-01-29 VITALS — BP 124/77 | HR 96 | Resp 16 | Ht 64.0 in | Wt 220.0 lb

## 2014-01-29 DIAGNOSIS — M545 Low back pain, unspecified: Secondary | ICD-10-CM

## 2014-01-29 DIAGNOSIS — D472 Monoclonal gammopathy: Secondary | ICD-10-CM

## 2014-01-29 DIAGNOSIS — C9000 Multiple myeloma not having achieved remission: Secondary | ICD-10-CM

## 2014-01-29 MED ORDER — TRAMADOL HCL 50 MG PO TABS: 50.0000 mg | ORAL_TABLET | Freq: Four times a day (QID) | ORAL | Status: DC | PRN

## 2014-01-29 NOTE — Telephone Encounter (Signed)
Scheduled 9-23 MRI at [facility] with Watt Climes, Faxed orders and Baxter Flattery aware to precert

## 2014-01-29 NOTE — Telephone Encounter (Signed)
This member's plan does not currently require notification or prior-authorization through the [place] Notification or Prior-Authorization Program for MRI imaging. Please contact a Customer Care Professional at [phone] if you believe the information returned to be in error.   01/30/2014 @ [facility]  CPT:  [place] [id number]  LUMBAR SPINE     [phone]

## 2014-01-29 NOTE — Progress Notes (Signed)
Spoke with Tywanda from [facility], [place]; needed order for MRI with and without contrast of lumbar spine.

## 2014-01-29 NOTE — Progress Notes (Signed)
Hematology and Oncology Follow Up Visit  Barbara Cook [id number] Feb 16, 1956 58 y.o. 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  Current Therapy:    Observation     Interim History:  Ms.  Cook is in for followup. This is an early of 0.4. Patient have this abdominal pain. This is over on the right side. Is in the right lower abdomen. There feels like had a CT scan done. Barbara Cook had this done on the 11th. This showed a new compression fracture at T12. There is no multifocal lesions throughout the thoracic spine. Barbara Cook was felt to have myelomatous involvement in the spine.  Will last saw Barbara Cook, Barbara Cook M spike was stable at 2.43 g/L. Barbara Cook IgG level was 3000 mg/dL. Barbara Cook Kappa light. chain was 16.4 mg/dL  Barbara Cook's really not complaining of any back pain.  Barbara Cook has some rib x-rays done in late August. These did not show any obvious lytic lesions.  Barbara Cook's had no change in bowel bladder habits. Barbara Cook's had no nausea vomiting. Barbara Cook's had no cough. There's been no chest wall pain. Barbara Cook's had no leg swelling. Patient does have I think, rheumatoid arthritis. Barbara Cook does see a rheumatologist for this.      Medications: Current outpatient prescriptions:acetaminophen (TYLENOL) 650 MG CR tablet, Take 1,300 mg by mouth every 8 (eight) hours as needed for pain. , Disp: , Rfl: ;  Calcium Carbonate-Vit D-Min (CALCIUM 1200 PO), Take by mouth every morning., Disp: , Rfl: ;  Cholecalciferol (VITAMIN D) 1000 UNITS capsule, Take 1,000 Units by mouth daily. , Disp: , Rfl: ;  Magnesium 250 MG TABS, Take by mouth every morning., Disp: , Rfl:  Multiple Vitamin (MULTIVITAMIN) tablet, Take 1 tablet by mouth daily., Disp: , Rfl: ;  potassium chloride (KLOR-CON) 8 MEQ CR tablet, Take 8 mEq by mouth daily.  , Disp: , Rfl: ;  predniSONE (DELTASONE) 1 MG tablet, Take 1 mg by mouth daily with breakfast. Takes with 40m tablet to have a total of 633mdaily, Disp: , Rfl: ;  predniSONE (DELTASONE) 5 MG  tablet, Take 5 mg by mouth daily. Takes with 3m52mablet to have a total of 6mg23mily, Disp: , Rfl:  spironolactone (ALDACTONE) 25 MG tablet, Take 50 mg by mouth daily. , Disp: , Rfl: ;  torsemide (DEMADEX) 20 MG tablet, Take 40 mg by mouth daily. , Disp: , Rfl: ;  traMADol (ULTRAM) 50 MG tablet, Take 1 tablet (50 mg total) by mouth every 6 (six) hours as needed., Disp: 90 tablet, Rfl: 2  Allergies:  Allergies  Allergen Reactions  . Codeine Palpitations    Past Medical History, Surgical history, Social history, and Family History were reviewed and updated.  Review of Systems: As above  Physical Exam:  height is _0  (1.626 m) and weight is 220 lb (99.791 kg). Barbara Cook blood pressure is 124/77 and Barbara Cook pulse is 96. Barbara Cook respiration is 16.   Well-developed and well-nourished Afro-American female. Head and exam shows no ocular or oral lesions. Barbara Cook has no palpable cervical or supraclavicular lymph nodes. Lungs are clear. Cardiac exam regular in rhythm. Abdomen soft. Barbara Cook has good bowel sounds. There is no fluid wave. There is a palpable liver or spleen tip. Extremities shows no clubbing, cyanosis or edema. Neurological exam shows no focal neurological deficits. Back exam shows no tenderness over the spine. There is no tenderness to palpation or percussion over the spine. No spasm noted in the back.  Lab Results  Component Value Date   WBC 7.0 12/10/2013   HGB 10.8* 12/10/2013   HCT 32.3* 12/10/2013   MCV 92 12/10/2013   PLT 174 12/10/2013     Chemistry      Component Value Date/Time   NA 138 12/10/2013 1351   K 3.5 12/10/2013 1351   CL 101 12/10/2013 1351   CO2 31 12/10/2013 1351   BUN 24* 12/10/2013 1351   CREATININE 0.99 12/10/2013 1351      Component Value Date/Time   CALCIUM 9.1 12/10/2013 1351   ALKPHOS 44 12/10/2013 1351   AST 30 12/10/2013 1351   ALT 28 12/10/2013 1351   BILITOT 0.3 12/10/2013 1351         Impression and Plan: Barbara Cook is 58 year old Afro-American female. I do see Barbara Cook for probably 4  or 5 years. Barbara Cook has MGUS.  We had to really worry that Barbara Cook now has myeloma.  I would I do think that these lesions are probably myelomatous. Barbara Cook is MRI. We will get this set up for Barbara Cook.  With a compression fracture, we may need to consider a kyphoplasty. We did do a biopsy during a kyphoplasty. Pressure probably will need a bone marrow biopsy.  I also may consider a PET scan for Barbara Cook if we phone Barbara Cook has myeloma.  I spent a good 45 minutes with Barbara Cook and Barbara Cook husband. I explained to them what I thought was going on. I told Barbara Cook that we can certainly treat this if Barbara Cook has myeloma. Barbara Cook does understand this.   Barbara Napoleon, MD 9/22/20151:32 PM

## 2014-02-01 ENCOUNTER — Encounter: Payer: Self-pay | Admitting: Hematology & Oncology

## 2014-02-04 ENCOUNTER — Encounter: Payer: Self-pay | Admitting: Nurse Practitioner

## 2014-02-04 ENCOUNTER — Other Ambulatory Visit (HOSPITAL_BASED_OUTPATIENT_CLINIC_OR_DEPARTMENT_OTHER): Payer: 59 | Admitting: Lab

## 2014-02-04 ENCOUNTER — Other Ambulatory Visit: Payer: Self-pay | Admitting: Nurse Practitioner

## 2014-02-04 DIAGNOSIS — D472 Monoclonal gammopathy: Secondary | ICD-10-CM

## 2014-02-04 LAB — CBC WITH DIFFERENTIAL (CANCER CENTER ONLY)
BASO#: 0 10*3/uL (ref 0.0–0.2)
BASO%: 0.3 % (ref 0.0–2.0)
EOS%: 1.9 % (ref 0.0–7.0)
Eosinophils Absolute: 0.1 10*3/uL (ref 0.0–0.5)
HCT: 30.8 % — ABNORMAL LOW (ref 34.8–46.6)
HGB: 10.1 g/dL — ABNORMAL LOW (ref 11.6–15.9)
LYMPH#: 2.1 10*3/uL (ref 0.9–3.3)
LYMPH%: 55.9 % — ABNORMAL HIGH (ref 14.0–48.0)
MCH: 30.2 pg (ref 26.0–34.0)
MCHC: 32.8 g/dL (ref 32.0–36.0)
MCV: 92 fL (ref 81–101)
MONO#: 0.4 10*3/uL (ref 0.1–0.9)
MONO%: 11.7 % (ref 0.0–13.0)
NEUT%: 30.2 % — ABNORMAL LOW (ref 39.6–80.0)
NEUTROS ABS: 1.1 10*3/uL — AB (ref 1.5–6.5)
PLATELETS: 157 10*3/uL (ref 145–400)
RBC: 3.34 10*6/uL — ABNORMAL LOW (ref 3.70–5.32)
RDW: 14.5 % (ref 11.1–15.7)
WBC: 3.7 10*3/uL — ABNORMAL LOW (ref 3.9–10.0)

## 2014-02-06 ENCOUNTER — Other Ambulatory Visit: Payer: Self-pay | Admitting: Hematology & Oncology

## 2014-02-06 DIAGNOSIS — D472 Monoclonal gammopathy: Secondary | ICD-10-CM

## 2014-02-06 LAB — SPEP & IFE WITH QIG
ALBUMIN ELP: 47.8 % — AB (ref 55.8–66.1)
ALPHA-1-GLOBULIN: 3.3 % (ref 2.9–4.9)
Alpha-2-Globulin: 7.9 % (ref 7.1–11.8)
BETA GLOBULIN: 4.8 % (ref 4.7–7.2)
Beta 2: 2.3 % — ABNORMAL LOW (ref 3.2–6.5)
Gamma Globulin: 33.9 % — ABNORMAL HIGH (ref 11.1–18.8)
IGA: 17 mg/dL — AB (ref 69–380)
IgG (Immunoglobin G), Serum: 3240 mg/dL — ABNORMAL HIGH (ref 690–1700)
IgM, Serum: 7 mg/dL — ABNORMAL LOW (ref 52–322)
M-Spike, %: 2.52 g/dL
TOTAL PROTEIN, SERUM ELECTROPHOR: 8.3 g/dL (ref 6.0–8.3)

## 2014-02-06 LAB — COMPREHENSIVE METABOLIC PANEL
ALK PHOS: 45 U/L (ref 39–117)
ALT: 21 U/L (ref 0–35)
AST: 22 U/L (ref 0–37)
Albumin: 3.6 g/dL (ref 3.5–5.2)
BUN: 15 mg/dL (ref 6–23)
CO2: 25 mEq/L (ref 19–32)
Calcium: 9.4 mg/dL (ref 8.4–10.5)
Chloride: 107 mEq/L (ref 96–112)
Creatinine, Ser: 0.9 mg/dL (ref 0.50–1.10)
GLUCOSE: 87 mg/dL (ref 70–99)
Potassium: 4.1 mEq/L (ref 3.5–5.3)
SODIUM: 139 meq/L (ref 135–145)
TOTAL PROTEIN: 8.3 g/dL (ref 6.0–8.3)
Total Bilirubin: 0.5 mg/dL (ref 0.2–1.2)

## 2014-02-06 LAB — KAPPA/LAMBDA LIGHT CHAINS
KAPPA LAMBDA RATIO: 47.81 — AB (ref 0.26–1.65)
Kappa free light chain: 7.65 mg/dL — ABNORMAL HIGH (ref 0.33–1.94)
LAMBDA FREE LGHT CHN: 0.16 mg/dL — AB (ref 0.57–2.63)

## 2014-02-06 LAB — LACTATE DEHYDROGENASE: LDH: 187 U/L (ref 94–250)

## 2014-02-06 LAB — BETA 2 MICROGLOBULIN, SERUM: Beta-2 Microglobulin: 3.38 mg/L — ABNORMAL HIGH (ref <=2.51)

## 2014-02-07 ENCOUNTER — Telehealth: Payer: Self-pay | Admitting: Hematology & Oncology

## 2014-02-07 ENCOUNTER — Other Ambulatory Visit: Payer: Self-pay | Admitting: Hematology & Oncology

## 2014-02-07 DIAGNOSIS — D472 Monoclonal gammopathy: Secondary | ICD-10-CM

## 2014-02-07 DIAGNOSIS — C9030 Solitary plasmacytoma not having achieved remission: Secondary | ICD-10-CM

## 2014-02-07 NOTE — Telephone Encounter (Signed)
Per MD IR is aware to contact pt for referral.

## 2014-02-08 ENCOUNTER — Encounter (HOSPITAL_COMMUNITY): Payer: Self-pay | Admitting: Pharmacy Technician

## 2014-02-08 LAB — UIFE/LIGHT CHAINS/TP QN, 24-HR UR
ALBUMIN, U: DETECTED
ALPHA 1 UR: DETECTED — AB
Alpha 2, Urine: DETECTED — AB
Beta, Urine: DETECTED — AB
GAMMA UR: DETECTED — AB
Time: 24 hours
Total Protein, Urine: <4 mg/dL — ABNORMAL LOW (ref 5–24)
Volume, Urine: 1800 mL

## 2014-02-08 LAB — KAPPA/LAMBDA LIGHT CHAINS, FREE, WITH RATIO, 24HR. URINE: KAPPA Ligh Chain, Free U: 1.28 mg/L — ABNORMAL LOW (ref 1.35–24.19)

## 2014-02-11 ENCOUNTER — Other Ambulatory Visit: Payer: Self-pay | Admitting: Radiology

## 2014-02-12 ENCOUNTER — Other Ambulatory Visit: Payer: Self-pay | Admitting: Hematology & Oncology

## 2014-02-12 ENCOUNTER — Inpatient Hospital Stay
Admission: RE | Admit: 2014-02-12 | Discharge: 2014-02-12 | Disposition: A | Discharge status: Home / Self Care | Payer: Self-pay | Source: Ambulatory Visit | Attending: Hematology & Oncology | Admitting: Hematology & Oncology

## 2014-02-12 DIAGNOSIS — D472 Monoclonal gammopathy: Secondary | ICD-10-CM

## 2014-02-13 ENCOUNTER — Encounter (HOSPITAL_COMMUNITY): Payer: Self-pay

## 2014-02-13 ENCOUNTER — Emergency Department (HOSPITAL_COMMUNITY): Admission: EM | Admit: 2014-02-13 | Discharge: 2014-02-13 | Discharge status: Against Medical Advice | Payer: 59

## 2014-02-13 ENCOUNTER — Ambulatory Visit (HOSPITAL_COMMUNITY)
Admission: RE | Admit: 2014-02-13 | Discharge: 2014-02-13 | Disposition: A | Discharge status: Home / Self Care | Payer: 59 | Source: Ambulatory Visit | Attending: Hematology & Oncology | Admitting: Hematology & Oncology

## 2014-02-13 DIAGNOSIS — M353 Polymyalgia rheumatica: Secondary | ICD-10-CM | POA: Insufficient documentation

## 2014-02-13 DIAGNOSIS — M1990 Unspecified osteoarthritis, unspecified site: Secondary | ICD-10-CM | POA: Diagnosis not present

## 2014-02-13 DIAGNOSIS — K589 Irritable bowel syndrome without diarrhea: Secondary | ICD-10-CM | POA: Insufficient documentation

## 2014-02-13 DIAGNOSIS — Z885 Allergy status to narcotic agent status: Secondary | ICD-10-CM | POA: Insufficient documentation

## 2014-02-13 DIAGNOSIS — M129 Arthropathy, unspecified: Secondary | ICD-10-CM | POA: Diagnosis not present

## 2014-02-13 DIAGNOSIS — C9030 Solitary plasmacytoma not having achieved remission: Secondary | ICD-10-CM | POA: Diagnosis not present

## 2014-02-13 DIAGNOSIS — N809 Endometriosis, unspecified: Secondary | ICD-10-CM | POA: Insufficient documentation

## 2014-02-13 DIAGNOSIS — Z79899 Other long term (current) drug therapy: Secondary | ICD-10-CM | POA: Insufficient documentation

## 2014-02-13 DIAGNOSIS — K219 Gastro-esophageal reflux disease without esophagitis: Secondary | ICD-10-CM | POA: Insufficient documentation

## 2014-02-13 DIAGNOSIS — Z87891 Personal history of nicotine dependence: Secondary | ICD-10-CM | POA: Insufficient documentation

## 2014-02-13 DIAGNOSIS — C9000 Multiple myeloma not having achieved remission: Secondary | ICD-10-CM | POA: Diagnosis present

## 2014-02-13 DIAGNOSIS — D472 Monoclonal gammopathy: Secondary | ICD-10-CM

## 2014-02-13 LAB — CBC
HCT: 30.3 % — ABNORMAL LOW (ref 36.0–46.0)
Hemoglobin: 10 g/dL — ABNORMAL LOW (ref 12.0–15.0)
MCH: 29.6 pg (ref 26.0–34.0)
MCHC: 33 g/dL (ref 30.0–36.0)
MCV: 89.6 fL (ref 78.0–100.0)
PLATELETS: 174 10*3/uL (ref 150–400)
RBC: 3.38 MIL/uL — AB (ref 3.87–5.11)
RDW: 14.8 % (ref 11.5–15.5)
WBC: 5.2 10*3/uL (ref 4.0–10.5)

## 2014-02-13 LAB — APTT: aPTT: 29 seconds (ref 24–37)

## 2014-02-13 LAB — PROTIME-INR
INR: 1.11 (ref 0.00–1.49)
PROTHROMBIN TIME: 14.4 s (ref 11.6–15.2)

## 2014-02-13 MED ORDER — SODIUM CHLORIDE 0.9 % IV SOLN
INTRAVENOUS | Status: DC
  Administered 2014-02-13: 08:00:00 via INTRAVENOUS

## 2014-02-13 MED ORDER — MIDAZOLAM HCL 2 MG/2ML IJ SOLN
INTRAMUSCULAR | Status: AC
  Filled 2014-02-13: qty 4

## 2014-02-13 MED ORDER — FENTANYL CITRATE 0.05 MG/ML IJ SOLN
INTRAMUSCULAR | Status: AC
  Filled 2014-02-13: qty 6

## 2014-02-13 MED ORDER — MIDAZOLAM HCL 2 MG/2ML IJ SOLN
INTRAMUSCULAR | Status: AC
  Filled 2014-02-13: qty 6

## 2014-02-13 MED ORDER — FENTANYL CITRATE 0.05 MG/ML IJ SOLN
INTRAMUSCULAR | Status: AC | PRN
  Administered 2014-02-13 (×6): 50 ug via INTRAVENOUS

## 2014-02-13 MED ORDER — MIDAZOLAM HCL 2 MG/2ML IJ SOLN
INTRAMUSCULAR | Status: AC | PRN
  Administered 2014-02-13 (×6): 1 mg via INTRAVENOUS

## 2014-02-13 MED ORDER — HYDROMORPHONE HCL 1 MG/ML IJ SOLN
INTRAMUSCULAR | Status: AC | PRN
  Administered 2014-02-13: 0.5 mg via INTRAVENOUS

## 2014-02-13 MED ORDER — HYDROMORPHONE HCL 2 MG/ML IJ SOLN
INTRAMUSCULAR | Status: AC
  Filled 2014-02-13: qty 1

## 2014-02-13 NOTE — Discharge Instructions (Signed)
Needle Biopsy °Care After °These instructions give you information on caring for yourself after your procedure. Your doctor may also give you more specific instructions. Call your doctor if you have any problems or questions after your procedure. °HOME CARE °· Rest for 4 hours after your biopsy, except for getting up to go to the bathroom or as told. °· Keep the places where the needles were put in clean and dry. °¨ Do not put powder or lotion on the sites. °¨ Do not shower until 24 hours after the test. Remove all bandages (dressings) before showering. °¨ Remove all bandages at least once every day. Gently clean the sites with soap and water. Keep putting a new bandage on until the skin is closed. °Finding out the results of your test °Ask your doctor when your test results will be ready. Make sure you follow up and get the test results. °GET HELP RIGHT AWAY IF:  °· You have shortness of breath or trouble breathing. °· You have pain or cramping in your belly (abdomen). °· You feel sick to your stomach (nauseous) or throw up (vomit). °· Any of the places where the needles were put in: °¨ Are puffy (swollen) or red. °¨ Are sore or hot to the touch. °¨ Are draining yellowish-white fluid (pus). °¨ Are bleeding after 10 minutes of pressing down on the site. Have someone keep pressing on any place that is bleeding until you see a doctor. °· You have any unusual pain that will not stop. °· You have a fever. °If you go to the emergency room, tell the nurse that you had a biopsy. Take this paper with you to show the nurse. °MAKE SURE YOU:  °· Understand these instructions. °· Will watch your condition. °· Will get help right away if you are not doing well or get worse. °Document Released: 04/08/2008 Document Revised: 07/19/2011 Document Reviewed: 04/08/2008 °ExitCare® Patient Information ©2015 [employer]. This information is not intended to replace advice given to you by your health care provider. Make sure you discuss  any questions you have with your health care provider. °Conscious Sedation °Sedation is the use of medicines to promote relaxation and relieve discomfort and anxiety. Conscious sedation is a type of sedation. Under conscious sedation you are less alert than normal but are still able to respond to instructions or stimulation. Conscious sedation is used during short medical and dental procedures. It is milder than deep sedation or general anesthesia and allows you to return to your regular activities sooner.  °LET YOUR HEALTH CARE PROVIDER KNOW ABOUT:  °· Any allergies you have. °· All medicines you are taking, including vitamins, herbs, eye drops, creams, and over-the-counter medicines. °· Use of steroids (by mouth or creams). °· Previous problems you or members of your family have had with the use of anesthetics. °· Any blood disorders you have. °· Previous surgeries you have had. °· Medical conditions you have. °· Possibility of pregnancy, if this applies. °· Use of cigarettes, alcohol, or illegal drugs. °RISKS AND COMPLICATIONS °Generally, this is a safe procedure. However, as with any procedure, problems can occur. Possible problems include: °· Oversedation. °· Trouble breathing on your own. You may need to have a breathing tube until you are awake and breathing on your own. °· Allergic reaction to any of the medicines used for the procedure. °BEFORE THE PROCEDURE °· You may have blood tests done. These tests can help show how well your kidneys and liver are working. They can also   show how well your blood clots. °· A physical exam will be done.   °· Only take medicines as directed by your health care provider. You may need to stop taking medicines (such as blood thinners, aspirin, or nonsteroidal anti-inflammatory drugs) before the procedure.   °· Do not eat or drink at least 6 hours before the procedure or as directed by your health care provider. °· Arrange for a responsible adult, family member, or friend to  take you home after the procedure. He or she should stay with you for at least 24 hours after the procedure, until the medicine has worn off. °PROCEDURE  °· An intravenous (IV) catheter will be inserted into one of your veins. Medicine will be able to flow directly into your body through this catheter. You may be given medicine through this tube to help prevent pain and help you relax. °· The medical or dental procedure will be done. °AFTER THE PROCEDURE °· You will stay in a recovery area until the medicine has worn off. Your blood pressure and pulse will be checked.   °·  Depending on the procedure you had, you may be allowed to go home when you can tolerate liquids and your pain is under control. °Document Released: 01/19/2001 Document Revised: 05/01/2013 Document Reviewed: 01/01/2013 °ExitCare® Patient Information ©2015 [employer]. This information is not intended to replace advice given to you by your health care provider. Make sure you discuss any questions you have with your health care provider. ° °

## 2014-02-13 NOTE — H&P (Signed)
Chief Complaint: "I'm having a biopsy"  Referring Physician(s): Ennever,Peter R  History of Present Illness: Barbara Cook is a 57 y.o. female with history of PMR, MGUS and recent right abdominal pain . F/u imaging has revealed a mass on right side of T12 vertebral body. She presents today for CT guided biopsy of this area.  Past Medical History  Diagnosis Date  . Arthropathy, unspecified, site unspecified   . GERD (gastroesophageal reflux disease)   . Arthritis   . MGUS (monoclonal gammopathy of unknown significance) 02/08/2012  . Endometriosis   . Fibroid   . Polymyalgia rheumatica   . IBS (irritable bowel syndrome)     Past Surgical History  Procedure Laterality Date  . Hernia repair    . Tubal ligation    . Neck surgery    . Fistula repair surg      Allergies: Codeine  Medications: Prior to Admission medications   Medication Sig Start Date End Date Taking? Authorizing Provider  acetaminophen (TYLENOL) 650 MG CR tablet Take 1,300 mg by mouth every 8 (eight) hours as needed for pain.    Yes Historical Provider, MD  potassium chloride (KLOR-CON) 8 MEQ CR tablet Take 8 mEq by mouth every morning.    Yes Historical Provider, MD  Calcium Carbonate-Vit D-Min (CALCIUM 1200 PO) Take 1 tablet by mouth every evening.     Historical Provider, MD  Cholecalciferol (VITAMIN D) 1000 UNITS capsule Take 1,000 Units by mouth every evening.     Historical Provider, MD  Magnesium 250 MG TABS Take 1 tablet by mouth every evening.     Historical Provider, MD  Multiple Vitamin (MULTIVITAMIN) tablet Take 1 tablet by mouth every evening.     Historical Provider, MD  spironolactone (ALDACTONE) 25 MG tablet Take 50 mg by mouth every morning.     Historical Provider, MD  torsemide (DEMADEX) 20 MG tablet Take 40 mg by mouth every morning.     Historical Provider, MD  traMADol (ULTRAM) 50 MG tablet Take 50 mg by mouth every 6 (six) hours as needed for moderate pain.    Historical Provider, MD      Family History  Problem Relation Age of Onset  . Lung cancer Maternal Uncle   . Throat cancer Maternal Aunt   . Diabetes Maternal Grandmother   . Hypertension Maternal Grandmother   . Heart disease Maternal Grandmother   . Heart disease Paternal Grandmother   . Colon cancer Neg Hx   . Heart disease Mother   . Rheum arthritis Mother     History   Social History  . Marital Status: Married    Spouse Name: [name]    Number of Children: 1  . Years of Education: 12   Occupational History  . [employer]   Social History Main Topics  . Smoking status: Former Smoker -- 0.20 packs/day for 2 years    Types: Cigarettes    Quit date: 01/11/1994  . Smokeless tobacco: Never Used  . Alcohol Use: No  . Drug Use: No  . Sexual Activity: Yes    Birth Control/ Protection: Surgical     Comment: BTL   Other Topics Concern  . None   Social History Narrative   Patient lives at home with her husband [name]). Patient works full time.   Education- High school   Right handed.   Caffeine- None         Review of Systems  Constitutional: Positive for fatigue.  Negative for fever and chills.  Respiratory: Negative for shortness of breath.        Occ dry cough  Cardiovascular: Negative for chest pain.  Gastrointestinal: Positive for abdominal pain. Negative for nausea, vomiting, diarrhea and blood in stool.  Genitourinary: Negative for dysuria and hematuria.  Musculoskeletal: Positive for arthralgias and myalgias. Negative for back pain.  Neurological: Negative for headaches.  Hematological: Does not bruise/bleed easily.    Vital Signs: BP 137/67  Pulse 96  Temp(Src) 97.5 F (36.4 C) (Oral)  Resp 18  Ht 5\' 4"  (1.626 m)  Wt 220 lb (99.791 kg)  BMI 37.74 kg/m2  SpO2 98%  Physical Exam  Constitutional: She is oriented to person, place, and time. She appears well-developed and well-nourished.  Cardiovascular: Normal rate and regular rhythm.    Pulmonary/Chest: Effort normal and breath sounds normal.  Abdominal: Soft. Bowel sounds are normal.  obese  Musculoskeletal: Normal range of motion.  Neurological: She is alert and oriented to person, place, and time.    Imaging: Ct Chest W Contrast  01/18/2014   CLINICAL DATA:  Evaluate abnormal mediastinum.  EXAM: CT CHEST WITH CONTRAST  TECHNIQUE: Multidetector CT imaging of the chest was performed during intravenous contrast administration.  CONTRAST:  61mL OMNIPAQUE IOHEXOL 300 MG/ML  SOLN  COMPARISON:  04/18/2012  FINDINGS: There is no pleural effusion identified. Pulmonary nodule in the left lower lobe is again identified and is stable measuring 6 mm, image 33/series 3. No new pulmonary nodules identified.  The heart size appears normal. There is no pericardial effusion. The trachea appears normal and midline. Normal appearance of the esophagus. Again noted are several enlarged low-attenuation right paratracheal region lymph nodes. These measure up to 1.2 cm. Hounsfield units measure up to 11. When compared with the previous exam the appearance is unchanged. No axillary or supraclavicular adenopathy.  Incidental imaging through the upper abdomen is unremarkable. The adrenal glands and visualized portions of the liver, pancreas, gallbladder and spleen appear normal.  Review of the visualized osseous structures is remarkable for a large lucent lesion involving the T12 vertebra. This measures 2.4 cm and there appears to be a pathologic fracture through the inferior endplate of the Z32 vertebra. There is a 8 mm lytic lesion within the inferior endplate of D92. Smaller lucent lesion is identified within the sternum measuring 8 mm. This is also new from previous exam.  IMPRESSION: 1. Stable appearance of low-attenuation, enlarged right paratracheal lymph nodes when compared with 04/18/2012. 2. New multi focal lucent lesions are identified within the bony thorax. The largest involves the T12 vertebra with  there is a pathologic fracture in through the inferior endplate. According to the patient's medical record the patient has a monoclonal gammopathy of unknown significance. These findings may be a manifestation of myeloma. The T12 lesion is of concern as it may be of neurosurgical significance.  3. These results were called by telephone at the time of interpretation on 01/18/2014 at 5:44 pm to Dr. Dr. Julieanne Manson, who verbally acknowledged these results.   Electronically Signed   By: Kerby Moors M.D.   On: 01/18/2014 17:45    Labs:  CBC:  Recent Labs  08/09/13 1527 12/10/13 1351 02/04/14 0933 02/13/14 0735  WBC 7.6 7.0 3.7* 5.2  HGB 11.0* 10.8* 10.1* 10.0*  HCT 33.5* 32.3* 30.8* 30.3*  PLT 189 174 157 174    COAGS:  Recent Labs  02/13/14 0735  INR 1.11  APTT 29    BMP:  Recent  Labs  04/12/13 1508 08/09/13 1527 12/10/13 1351 02/04/14 0933  NA 134* 139 138 139  K 3.6 4.3 3.5 4.1  CL 98 102 101 107  CO2 31 31 31 25   GLUCOSE 81 130* 147* 87  BUN 20 25* 24* 15  CALCIUM 9.6 9.3 9.1 9.4  CREATININE 0.96 1.08 0.99 0.90    LIVER FUNCTION TESTS:  Recent Labs  04/12/13 1508 08/09/13 1527 12/10/13 1351 02/04/14 0933  BILITOT 0.5 0.3 0.3 0.5  AST 26 20 30 22   ALT 27 25 28 21   ALKPHOS 47 51 44 45  PROT 8.9* 8.1 8.1 8.3  ALBUMIN 3.6 3.6 3.6 3.6    TUMOR MARKERS: No results found for this basename: AFPTM, CEA, CA199, CHROMGRNA,  in the last 8760 hours  Assessment and Plan: Barbara Cook is a 58 y.o. female with history of PMR, MGUS and recent right abdominal pain . F/u imaging has revealed a mass on right side of T12 vertebral body. She presents today for CT guided biopsy of this area. Details/risks of procedure d/w pt/husband with their understanding and consent.            Signed: Autumn Messing 02/13/2014, 8:34 AM

## 2014-02-13 NOTE — Procedures (Signed)
Ct guided T12 lytic lesion bone bx Lesion would not yield a solid core because of lytic component Only able to obtain scant material and large blood clot despite several passes thru the lesion No comp Stable Path pending Full report in PACS

## 2014-02-15 ENCOUNTER — Ambulatory Visit (HOSPITAL_COMMUNITY)
Admission: RE | Admit: 2014-02-15 | Discharge: 2014-02-15 | Disposition: A | Discharge status: Home / Self Care | Payer: 59 | Source: Ambulatory Visit | Attending: Hematology & Oncology | Admitting: Hematology & Oncology

## 2014-02-15 DIAGNOSIS — D472 Monoclonal gammopathy: Secondary | ICD-10-CM

## 2014-02-18 ENCOUNTER — Ambulatory Visit (HOSPITAL_BASED_OUTPATIENT_CLINIC_OR_DEPARTMENT_OTHER): Payer: 59 | Admitting: Hematology & Oncology

## 2014-02-18 ENCOUNTER — Encounter: Payer: Self-pay | Admitting: Hematology & Oncology

## 2014-02-18 DIAGNOSIS — C9000 Multiple myeloma not having achieved remission: Secondary | ICD-10-CM | POA: Insufficient documentation

## 2014-02-18 DIAGNOSIS — D472 Monoclonal gammopathy: Secondary | ICD-10-CM

## 2014-02-18 HISTORY — DX: Multiple myeloma not having achieved remission: C90.00

## 2014-02-18 NOTE — Progress Notes (Signed)
Hematology and Oncology Follow Up Visit  Barbara Cook [id number] Jan 18, 1956 58 y.o. 02/18/2014   Principle Diagnosis:  IgG kappa myeloma    Current Therapy:    Observation     Interim History:  Ms.  Cook is in for followup. She wanted to come in for a conference.  I suspect  that has myeloma now. She had an MRI done which showed a mass at T12. This was biopsied and found to be plasma cells.  Barbara monoclonal studies showed an M spike of 2.52 g/L. Barbara IgG level was 3240 mg/dL. Barbara kappa light chain was 7.65 mg/dL.  A 24 urine was done which I still do not have the results back.  A bone survey was done. This showed lytic lesions in T10 and T12. There is a mild inferior endplate compression fracture at T12. Otherwise, no other abnormalities were noted on Barbara bone survey.  She will is not complaining much of back pain. She still working. She's having no problems bowels or bladder. She's had no leg swelling. She's had no rashes.  Barbara appetite is doing pretty good. She has no nausea or vomiting. She has no cough. No chest pain. No shortness of breath.  Overall, Barbara performance status is ECOG 1  Overall, Barbara       Medications: Current outpatient prescriptions:acetaminophen (TYLENOL) 650 MG CR tablet, Take 1,300 mg by mouth every 8 (eight) hours as needed for pain. , Disp: , Rfl: ;  Calcium Carbonate-Vit D-Min (CALCIUM 1200 PO), Take 1 tablet by mouth every evening. , Disp: , Rfl: ;  Cholecalciferol (VITAMIN D) 1000 UNITS capsule, Take 1,000 Units by mouth every evening. , Disp: , Rfl: ;  Magnesium 250 MG TABS, Take 1 tablet by mouth every evening. , Disp: , Rfl:  Multiple Vitamin (MULTIVITAMIN) tablet, Take 1 tablet by mouth every evening. , Disp: , Rfl: ;  potassium chloride (KLOR-CON) 8 MEQ CR tablet, Take 8 mEq by mouth every morning. , Disp: , Rfl: ;  spironolactone (ALDACTONE) 25 MG tablet, Take 50 mg by mouth every morning. , Disp: , Rfl: ;  torsemide (DEMADEX) 20 MG tablet,  Take 40 mg by mouth every morning. , Disp: , Rfl:  traMADol (ULTRAM) 50 MG tablet, Take 50 mg by mouth every 6 (six) hours as needed for moderate pain., Disp: , Rfl:   Allergies:  Allergies  Allergen Reactions  . Codeine Palpitations    Past Medical History, Surgical history, Social history, and Family History were reviewed and updated.  Review of Systems: As above  Physical Exam:  vitals were not taken for this visit.  Well-developed and well-nourished Afro-American female. Head and exam shows no ocular or oral lesions. She has no palpable cervical or supraclavicular lymph nodes. Lungs are clear. Cardiac exam regular in rhythm. Abdomen soft. She has good bowel sounds. There is no fluid wave. There is a palpable liver or spleen tip. Extremities shows no clubbing, cyanosis or edema. Neurological exam shows no focal neurological deficits. Back exam shows no tenderness over the spine. There is no tenderness to palpation or percussion over the spine. No spasm noted in the back.  Lab Results  Component Value Date   WBC 5.2 02/13/2014   HGB 10.0* 02/13/2014   HCT 30.3* 02/13/2014   MCV 89.6 02/13/2014   PLT 174 02/13/2014     Chemistry      Component Value Date/Time   NA 139 02/04/2014 0933   K 4.1 02/04/2014 0933   CL 107  02/04/2014 0933   CO2 25 02/04/2014 0933   BUN 15 02/04/2014 0933   CREATININE 0.90 02/04/2014 0933      Component Value Date/Time   CALCIUM 9.4 02/04/2014 0933   ALKPHOS 45 02/04/2014 0933   AST 22 02/04/2014 0933   ALT 21 02/04/2014 0933   BILITOT 0.5 02/04/2014 0933         Impression and Plan: Barbara Cook is 58 year old Afro-American female. I do see Barbara for probably 4 or 5 years. She has had MGUS. Now, I suspect that she probably has IgG kappa myeloma.  I spent about 40 minutes with she and Barbara Cook. They had questions about radiation. I think she needs radiation therapy to this T12 lesion. I told them that are not radiation doctor but that I would suspect  that she probably would have 2-3 weeks of radiation. Radiation would be Monday to Friday. I cannot imagine she would have a lot of side effects.  I told them that I probably would repeat a MRI about 2 months after the radiation is completed to see Barbara while she does.  She was to take alternative treatments for the myeloma. I told Barbara that she can do that and that I would not think that this would interfere with the radiation.  She will also need Zometa. I would this is very important for Barbara. I taught Barbara about Zometa. I told Barbara that I thought that this would be helpful and also controlling the myeloma. I told Barbara that I will would recommend Zometa once a month. I told Barbara about some of the side effects. I told Barbara about the less than 5% chance of the osteonecrosis of the jaw.  As far as systemic chemotherapy goes, very weak and just hold off on this for right now. Again, she really is not bad interested in systemic therapy. She would take it if she really had no other option.  We will call radiation oncology and see about an appointment for Barbara.  I probably would get Barbara in in 2 or 3 weeks for Zometa.  Barbara Napoleon, MD 10/12/20155:54 PM

## 2014-02-19 ENCOUNTER — Other Ambulatory Visit: Payer: Self-pay | Admitting: Nurse Practitioner

## 2014-02-19 DIAGNOSIS — C9000 Multiple myeloma not having achieved remission: Secondary | ICD-10-CM

## 2014-02-22 NOTE — Progress Notes (Signed)
Histology and Location of Primary Cancer: IgG kappa myeloma   Sites of Visceral and Bony Metastatic Disease: Bone study from 10/9 shows "Lytic lesions in the T10 and T12 vertebral bodies with mild T12  inferior endplate compression fracture."  Location(s) of Symptomatic Metastases: T12  Past/Anticipated chemotherapy by medical oncology, if any: Possible Zometa once a month  Pain on a scale of 0-10 is: 0   If Spine Met(s), symptoms, if any, include:  Bowel/Bladder retention or incontinence (please describe): no - patient has a history of IBS so has occasional symptoms with diarrhea and constipation.  Numbness or weakness in extremities (please describe): no  Current Decadron regimen, if applicable: no  Ambulatory status? Walker? Wheelchair?: Ambulatory - uses a can in the mornings due to polymyalgia rheumatica  SAFETY ISSUES:  Prior radiation? no  Pacemaker/ICD? no  Possible current pregnancy? no  Is the patient on methotrexate? no  Current Complaints / other details:  Patient is here with her husband.

## 2014-02-27 ENCOUNTER — Ambulatory Visit
Admission: RE | Admit: 2014-02-27 | Discharge: 2014-02-27 | Disposition: A | Discharge status: Home / Self Care | Payer: 59 | Source: Ambulatory Visit | Attending: Radiation Oncology | Admitting: Radiation Oncology

## 2014-02-27 ENCOUNTER — Encounter: Payer: Self-pay | Admitting: Radiation Oncology

## 2014-02-27 VITALS — BP 118/67 | HR 80 | Temp 98.0°F | Resp 16 | Ht 64.0 in | Wt 212.7 lb

## 2014-02-27 DIAGNOSIS — M8468XA Pathological fracture in other disease, other site, initial encounter for fracture: Secondary | ICD-10-CM | POA: Insufficient documentation

## 2014-02-27 DIAGNOSIS — Z510 Encounter for antineoplastic radiation therapy: Secondary | ICD-10-CM | POA: Diagnosis not present

## 2014-02-27 DIAGNOSIS — C9000 Multiple myeloma not having achieved remission: Secondary | ICD-10-CM

## 2014-02-27 DIAGNOSIS — Z87891 Personal history of nicotine dependence: Secondary | ICD-10-CM | POA: Insufficient documentation

## 2014-02-27 HISTORY — DX: Myelitis, unspecified: G04.91

## 2014-02-27 NOTE — Progress Notes (Signed)
Please see the Nurse Progress Note in the MD Initial Consult Encounter for this patient. 

## 2014-02-27 NOTE — Progress Notes (Signed)
Radiation Oncology         [phone]) [phone] ________________________________  Initial outpatient Consultation  Name: Barbara Cook MRN: [id number]  Date: 02/27/2014  DOB: 1955/09/14  AS:NKNLZ,JQBHAL DAVIDSON, MD  [name], MD   REFERRING PHYSICIAN: [name], MD  DIAGNOSIS: IgG kappa myeloma   HISTORY OF PRESENT ILLNESS::Barbara Cook is a 58 y.o. female who is seen out courtesy of Dr. Burney Gauze for an opinion concerning radiation therapy as part management of patient's recently diagnosed IgG kappa myeloma. Patient has been following with Dr. Marin Olp for some time. Patient had a prior history of monoclonal gammopathy of unknown significance. Earlier this year the patient pulled her back when getting into a SUV and She proceeded to undergo MRI of the thoracic and lumbar spine  which revealed a lesion at T12. There is also a smaller lesion noted at T10. 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 The patient she did undergo a skeletal survey which is documented below only  the 2 lesions in the thoracic spine noted. Her monoclonal studies showed an M spike of 2.52 g/L. Her IgG level was 3240 mg/dL. Her kappa light chain was 7.65 mg/dL. Urine IFE shows a monoclonal IgG heavy chain with associated Kapp[place]ight chain.  At this time the patient has decided not to pursue conventional treatment for her multiple myeloma. She is here to discuss ration therapy for management of her spine lesions.   PREVIOUS RADIATION THERAPY: No  PAST MEDICAL HISTORY:  has a past medical history of Arthropathy, unspecified, site unspecified; GERD (gastroesophageal reflux disease); Arthritis; MGUS (monoclonal gammopathy of unknown significance) (2020/08/1511); Endometriosis; Fibroid; Polymyalgia rheumatica; IBS (irritable bowel syndrome); Multiple myeloma (02/18/2014); and Myelitis (2011).    PAST  SURGICAL HISTORY: Past Surgical History  Procedure Laterality Date  . Hernia repair    . Tubal ligation    . Neck surgery    . Fistula repair surg      FAMILY HISTORY: family history includes Diabetes in her maternal grandmother; Heart disease in her maternal grandmother, mother, and paternal grandmother; Hypertension in her maternal grandmother; Lung cancer in her maternal uncle; Rheum arthritis in her mother; Throat cancer in her maternal aunt. There is no history of Colon cancer.  SOCIAL HISTORY:  reports that she quit smoking about 20 years ago. Her smoking use included Cigarettes. She has a .4 pack-year smoking history. She has never used smokeless tobacco. She reports that she does not drink alcohol or use illicit drugs.  ALLERGIES: Codeine  MEDICATIONS:  Current Outpatient Prescriptions  Medication Sig Dispense Refill  . acetaminophen (TYLENOL) 650 MG CR tablet Take 1,300 mg by mouth every 8 (eight) hours as needed for pain.       . Bromelains 500 MG TABS Take 1 tablet by mouth daily.      . BucAlfAspKGlucCouchParsUvaUrJu (WATER PILLS PO) Take 1 tablet by mouth 3 (three) times daily.      . Calcium Carbonate-Vit D-Min (CALCIUM 1200 PO) Take 1 tablet by mouth every evening.       . Chlorophyll (CHLOROXYGEN PO) Take 36 drops by mouth. Takes 36 drops twice a day      . Cholecalciferol (VITAMIN D) 1000 UNITS capsule Take 1,000 Units by mouth every evening.       . Liver Extract (LIVER PO) Take 3 capsules by mouth 2 (two) times daily.      . Magnesium 250 MG TABS Take  1 tablet by mouth every evening.       . Multiple Vitamin (MULTIVITAMIN) tablet Take 1 tablet by mouth every evening.       . Multiple Vitamins-Minerals (IMMUNE SYSTEM BOOSTER PO) Take 3 capsules by mouth 2 (two) times daily.      . NON FORMULARY 3 capsules 3 (three) times daily. Acid-2-alkaline      . Noni, Morinda citrifolia, (NONI JUICE PO) Take 1 oz by mouth daily.      Marland Kitchen OVER THE COUNTER MEDICATION 3 capsules 2  (two) times daily. curamin      . potassium chloride (KLOR-CON) 8 MEQ CR tablet Take 8 mEq by mouth every morning.       Marland Kitchen spironolactone (ALDACTONE) 25 MG tablet Take 50 mg by mouth every morning.       . torsemide (DEMADEX) 20 MG tablet Take 40 mg by mouth every morning.       . traMADol (ULTRAM) 50 MG tablet Take 50 mg by mouth every 6 (six) hours as needed for moderate pain.       No current facility-administered medications for this encounter.    REVIEW OF SYSTEMS:  A 15 point review of systems is documented in the electronic medical record. This was obtained by the nursing staff. However, I reviewed this with the patient to discuss relevant findings and make appropriate changes.  The patient denies any back pain at this time. She denies any fatigue. The patient denies any problems with bowel or bladder incontinence. She is prone to diarrhea with her IBS diagnosis. She denies any numbness or tingling in her lower extremities. Patient does have joint stiffness related to her polymyalgia rheumatica   PHYSICAL EXAM:  height is $RemoveB'5\' 4"'btNQZOaM$  (1.626 m) and weight is 212 lb 11.2 oz (96.48 kg). Her oral temperature is 98 F (36.7 C). Her blood pressure is 118/67 and her pulse is 80. Her respiration is 16 and oxygen saturation is 99%.   BP 118/67  Pulse 80  Temp(Src) 98 F (36.7 C) (Oral)  Resp 16  Ht $R'5\' 4"'Xd$  (1.626 m)  Wt 212 lb 11.2 oz (96.48 kg)  BMI 36.49 kg/m2  SpO2 99%  General Appearance:    Alert, cooperative, no distress, appears stated age, accompanied by her husband on evaluation today   Head:    Normocephalic, without obvious abnormality, atraumatic  Eyes:    PERRL, conjunctiva/corneas clear, EOM's intact,      Ears:    Normal TM's and external ear canals, both ears  Nose:   Nares normal, septum midline, mucosa normal, no drainage    or sinus tenderness  Throat:   Lips, mucosa, and tongue normal; teeth and gums normal  Neck:   Supple, symmetrical, trachea midline, no adenopathy;     thyroid:  no enlargement/tenderness/nodules; no carotid   bruit or JVD, scar along the anterior neck from prior disc surgery   Back:     Symmetric, no curvature, ROM normal, no CVA tenderness  Lungs:     Clear to auscultation bilaterally, respirations unlabored  Chest Wall:    No tenderness or deformity   Heart:    Regular rate and rhythm, S1 and S2 normal, no murmur, rub   or gallop     Abdomen:     Soft, non-tender, bowel sounds active all four quadrants,    no masses, no organomegaly        Extremities:   Extremities normal, atraumatic, no cyanosis, some edema in the ankle and  foot areas   Pulses:   2+ and symmetric all extremities  Skin:   Skin color, texture, turgor normal, no rashes or lesions  Lymph nodes:   Cervical, supraclavicular, and axillary nodes normal  Neurologic:    normal strength, sensation and reflexes    throughout     ECOG = 1    1 - Symptomatic but completely ambulatory (Restricted in physically strenuous activity but ambulatory and able to carry out work of a light or sedentary nature. For example, light housework, office work)   LABORATORY DATA:  Lab Results  Component Value Date   WBC 5.2 02/13/2014   HGB 10.0* 02/13/2014   HCT 30.3* 02/13/2014   MCV 89.6 02/13/2014   PLT 174 02/13/2014   NEUTROABS 1.1* 02/04/2014   Lab Results  Component Value Date   NA 139 02/04/2014   K 4.1 02/04/2014   CL 107 02/04/2014   CO2 25 02/04/2014   GLUCOSE 87 02/04/2014   CREATININE 0.90 02/04/2014   CALCIUM 9.4 02/04/2014      RADIOGRAPHY: Ct Biopsy  02/13/2014   CLINICAL DATA:  T12 lytic vertebral body lesion concerning for multiple myeloma/plasmacytoma  EXAM: CT GUIDED CORE BIOPSY OF T12 LYTIC VERTEBRAL BODY LESION  ANESTHESIA/SEDATION: SIX  Mg IV Versed; 300 mcg IV Fentanyl, AND 0.5 MG DILAUDID  Total Moderate Sedation Time: 33 min  PROCEDURE: The procedure risks, benefits, and alternatives were explained to the patient. Questions regarding the procedure were encouraged  and answered. The patient understands and consents to the procedure.  The left posterior paraspinous region was prepped with Betadinein a sterile fashion, and a sterile drape was applied covering the operative field. A sterile gown and sterile gloves were used for the procedure. Local anesthesia was provided with 1% Lidocaine.  Previous imaging reviewed. Patient positioned prone. Noncontrast localization CT performed. The T12 lytic vertebral lesion was localized. Under sterile conditions and local anesthesia, an 11 gauge 15 cm bone biopsy needle was advanced from an extra pedicular approach to the lesion. Needle position confirmed along the lesion lateral margin. Several core biopsies were attempted however sampling material was scant and mostly blood clot. This may be related to the lytic nature of the vertebral lesion. A solid core biopsy could not be obtained. Sampling placed in formalin. Needles removed. Postprocedure imaging demonstrates no immediate complication.  Complications: None.  FINDINGS: Imaging confirms needle placed in the T12 lytic vertebral lesion for biopsy  IMPRESSION: Successful CT-guided T12 lytic vertebral lesion biopsy.   Electronically Signed   By: Daryll Brod M.D.   On: 02/13/2014 11:36   Dg Bone Survey Met  02/15/2014   CLINICAL DATA:  Personal history of MGUS (monoclonal gammopathy of unknown significance). Recent biopsy of lytic T12 vertebral body lesion. Concern for multiple myeloma.  EXAM: METASTATIC BONE SURVEY  COMPARISON:  Outside thoracic and lumbar spine MRI 01/30/2014. Chest CT 01/18/2014. Rib radiographs 01/05/2014. Pelvis radiographs 06/13/2013. Cervical spine CT 07/13/2010 and radiographs 04/15/2010.  FINDINGS: Sequelae of prior partial corpectomy and bone grafting are again identified at C4-5 with solid osseous fusion present. Throughout the spine. There is mild mid thoracic dextroscoliosis. There is no listhesis. Mild T12 inferior endplate compression fracture is again  seen at with round lucent lesion involving much of the T12 vertebral body, similar to prior CT. The smaller, 1 cm lesion in the inferior T10 vertebral body is also again noted.  No lytic or blastic osseous lesions are identified throughout the remainder of the imaged skeleton. Lower right paratracheal  soft tissue prominence is similar to the prior chest radiograph and consistent with known lymphadenopathy in this location. Cardiac silhouette is within normal limits for size. Lungs are grossly clear without pleural effusion or pneumothorax identified. Mild osteoarthrosis is noted involving both knees.  IMPRESSION: Lytic lesions in the T10 and T12 vertebral bodies with mild T12 inferior endplate compression fracture, better demonstrated on prior cross-sectional imaging studies. No other bone lesions identified.   Electronically Signed   By: Logan Bores   On: 02/15/2014 10:15      IMPRESSION: IgG kappa myeloma. The patient has a significant lesion at the T12 vertebral body with early pathologic compression fracture. Patient also has a smaller lesion at T10. These areas the only known sites of involvement at this time. I would recommend radiation therapy to this area given the significant extent of the vertebral body despite the fact that the patient is asymptomatic at this time. I discussed radiation therapy associated side effects and potential toxicities as well as expected benefit with the patient and her husband. At this time the patient is undecided whether she will proceed with radiation therapy but will call if she decides to proceed with this recommendation. The patient is using alternative therapy as documented in her medications above and does not wish to proceed with conventional treatment as recommended by medical oncology. I did mention vertebroplasty to the patient but unsure whether she would be a candidate for this procedure and at this point patient does not wish to pursue this therapy  either.  PLAN: When necessary followup in radiation oncology. The patient knows the importance of close followup in medical oncology and will call if she develops back pain or other neurologic issues.     ------------------------------------------------  Blair Promise, PhD, MD

## 2014-02-28 ENCOUNTER — Telehealth: Payer: Self-pay | Admitting: *Deleted

## 2014-02-28 NOTE — Telephone Encounter (Signed)
Patient called after her appointment with Dr Sondra Come yesterday, to let Dr Marin Olp know that she has decided not to pursue radiation therapy at this time. She also wanted [place] to know that her work will be sending FMLA paperwork for [place] to fill out soon.

## 2014-03-04 ENCOUNTER — Ambulatory Visit: Payer: 59

## 2014-03-07 ENCOUNTER — Telehealth: Payer: Self-pay | Admitting: Hematology & Oncology

## 2014-03-07 NOTE — Telephone Encounter (Signed)
[employer] Disability papers completed and faxed along w med records today to:   F: [phone] P : [phone]     COPY SCANNED

## 2014-03-11 ENCOUNTER — Encounter: Payer: Self-pay | Admitting: Radiation Oncology

## 2014-03-13 ENCOUNTER — Ambulatory Visit: Payer: 59 | Admitting: Hematology & Oncology

## 2014-03-13 ENCOUNTER — Ambulatory Visit: Payer: 59

## 2014-03-13 ENCOUNTER — Other Ambulatory Visit: Payer: 59 | Admitting: Lab

## 2014-03-25 ENCOUNTER — Ambulatory Visit: Payer: 59 | Admitting: Hematology & Oncology

## 2014-03-25 ENCOUNTER — Ambulatory Visit: Payer: 59

## 2014-03-25 ENCOUNTER — Other Ambulatory Visit: Payer: 59 | Admitting: Lab

## 2014-03-26 ENCOUNTER — Encounter: Payer: Self-pay | Admitting: Hematology & Oncology

## 2014-03-26 ENCOUNTER — Ambulatory Visit: Payer: 59

## 2014-03-26 ENCOUNTER — Ambulatory Visit (HOSPITAL_BASED_OUTPATIENT_CLINIC_OR_DEPARTMENT_OTHER): Payer: 59 | Admitting: Hematology & Oncology

## 2014-03-26 ENCOUNTER — Ambulatory Visit (HOSPITAL_BASED_OUTPATIENT_CLINIC_OR_DEPARTMENT_OTHER): Payer: 59 | Admitting: Lab

## 2014-03-26 VITALS — BP 128/45 | HR 84 | Temp 97.9°F | Resp 16 | Ht 64.0 in | Wt 206.0 lb

## 2014-03-26 DIAGNOSIS — C9000 Multiple myeloma not having achieved remission: Secondary | ICD-10-CM

## 2014-03-26 DIAGNOSIS — D472 Monoclonal gammopathy: Secondary | ICD-10-CM

## 2014-03-26 DIAGNOSIS — D649 Anemia, unspecified: Secondary | ICD-10-CM

## 2014-03-26 LAB — CBC WITH DIFFERENTIAL (CANCER CENTER ONLY)
BASO#: 0 10e3/uL (ref 0.0–0.2)
BASO%: 0.2 % (ref 0.0–2.0)
EOS%: 2 % (ref 0.0–7.0)
Eosinophils Absolute: 0.1 10e3/uL (ref 0.0–0.5)
HCT: 31.6 % — ABNORMAL LOW (ref 34.8–46.6)
HGB: 10.4 g/dL — ABNORMAL LOW (ref 11.6–15.9)
LYMPH#: 2.4 10e3/uL (ref 0.9–3.3)
LYMPH%: 48.3 % — ABNORMAL HIGH (ref 14.0–48.0)
MCH: 30.1 pg (ref 26.0–34.0)
MCHC: 32.9 g/dL (ref 32.0–36.0)
MCV: 91 fL (ref 81–101)
MONO#: 0.5 10e3/uL (ref 0.1–0.9)
MONO%: 9.7 % (ref 0.0–13.0)
NEUT#: 2 10e3/uL (ref 1.5–6.5)
NEUT%: 39.8 % (ref 39.6–80.0)
Platelets: 159 10e3/uL (ref 145–400)
RBC: 3.46 10e6/uL — ABNORMAL LOW (ref 3.70–5.32)
RDW: 13.9 % (ref 11.1–15.7)
WBC: 5 10e3/uL (ref 3.9–10.0)

## 2014-03-26 LAB — IRON AND TIBC CHCC
%SAT: 31 % (ref 21–57)
Iron: 80 ug/dL (ref 41–142)
TIBC: 256 ug/dL (ref 236–444)
UIBC: 176 ug/dL (ref 120–384)

## 2014-03-26 LAB — FERRITIN CHCC: Ferritin: 151 ng/ml (ref 9–269)

## 2014-03-27 NOTE — Progress Notes (Signed)
Hematology and Oncology Follow Up Visit  DELETHA JAFFEE [id number] April 01, 1956 58 y.o. 03/27/2014   Principle Diagnosis:  IgG kappa myeloma    Current Therapy:    Observation     Interim History:  Ms.  Zagami is in for followup. She is feeling pretty well. She does get fatigued quite a bit. She does have lower back discomfort.  She has seen radiation oncology. After talking with Dr. 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 She is seen a naturopathic specialist. She is on a special diet right now.  She really has had no problems with cough.she has no fever. There is no shortness of breath. Her appetite is okay. She is watching what she eats. Overall, her performance status is ECOG 1  She still is not able to work. She probably needs to be out of work for another month or so.       Medications: Current outpatient prescriptions: acetaminophen (TYLENOL) 650 MG CR tablet, Take 1,300 mg by mouth every 8 (eight) hours as needed for pain. , Disp: , Rfl: ;  Bromelains 500 MG TABS, Take 1 tablet by mouth daily., Disp: , Rfl: ;  BucAlfAspKGlucCouchParsUvaUrJu (WATER PILLS PO), Take 1 tablet by mouth 3 (three) times daily., Disp: , Rfl: ;  Chlorophyll (CHLOROXYGEN PO), Take 36 drops by mouth. Takes 36 drops twice a day, Disp: , Rfl:  Cholecalciferol (VITAMIN D) 1000 UNITS capsule, Take 1,000 Units by mouth daily. 2 IN AM AND 2 IN PM, Disp: , Rfl: ;  Magnesium 250 MG TABS, Take 1 tablet by mouth 2 (two) times daily. , Disp: , Rfl: ;  Multiple Vitamin (MULTIVITAMIN) tablet, Take 1 tablet by mouth every evening. , Disp: , Rfl: ;  NON FORMULARY, 3 capsules 3 (three) times daily. Acid-2-alkaline, Disp: , Rfl:  Noni, Morinda citrifolia, (NONI JUICE PO), Take 1 oz by mouth daily., Disp: , Rfl: ;  OVER THE COUNTER MEDICATION, 3 capsules 2 (two) times daily. curamin, Disp: , Rfl: ;  torsemide (DEMADEX) 20 MG tablet, Take 40 mg by  mouth as needed. , Disp: , Rfl:   Allergies:  Allergies  Allergen Reactions  . Codeine Palpitations    Past Medical History, Surgical history, Social history, and Family History were reviewed and updated.  Review of Systems: As above  Physical Exam:  height is 5\' 4"  (1.626 m) and weight is 206 lb (93.441 kg). Her oral temperature is 97.9 F (36.6 C). Her blood pressure is 128/45 and her pulse is 84. Her respiration is 16.   Well-developed and well-nourished Afro-American female. Head and exam shows no ocular or oral lesions. She has no palpable cervical or supraclavicular lymph nodes. Lungs are clear. Cardiac exam regular rate and rhythm. There are no murmurs, rubs or bruits. Abdominal exam shows no fluid wave. There is no palpable liver or spleen tip. There are no neurological deficits. Back exam shows some tenderness over the lumbar spine. There is mild tenderness to palpation or percussion over the lumbar spine. mild spasm noted in the back. Skin exam shows no rashes, ecchymoses or petechia.  Lab Results  Component Value Date   WBC 5.0 03/26/2014   HGB 10.4* 03/26/2014   HCT 31.6* 03/26/2014   MCV 91 03/26/2014   PLT 159 03/26/2014     Chemistry      Component Value Date/Time   NA 133* 03/26/2014 1027   K 4.0 03/26/2014 1027   CL  99 03/26/2014 1027   CO2 26 03/26/2014 1027   BUN 22 03/26/2014 1027   CREATININE 0.97 03/26/2014 1027      Component Value Date/Time   CALCIUM 9.5 03/26/2014 1027   ALKPHOS 51 03/26/2014 1027   AST 18 03/26/2014 1027   ALT 17 03/26/2014 1027   BILITOT 0.4 03/26/2014 1027      IgG level is 3450 mg/dL.   Impression and Plan: Ms. Fellner is 58 year old Afro-American female. She has IgG myeloma. She has declined any therapy for this right now.  We will get another MRI. We will see what the MRI shows with respect to this T12 plasmacytoma.  Her IgG level is a little bit more elevated. We will have to see what her monoclonal spike  is.  her last 24-hour urine showed less than 4 mg per day of protein.  This is encouraging.  Her last bone survey in October showed lytic lesions at T10 and T12.   She probably would benefit from Zometa. She really is not interested in taking this right now. She really wants to try the natural approach.  I probably would have her come back in another 6 weeks. I will still try to have her take Zometa if she would. I think this is important for her. It is not chemotherapy which she really is not eager to try.  I spent about 45 minutes with she and her husband. I answered all their questions.  [name], MD 11/18/20156:21 PM

## 2014-03-28 LAB — IGG, IGA, IGM
IGA: 15 mg/dL — AB (ref 69–380)
IGM, SERUM: 8 mg/dL — AB (ref 52–322)
IgG (Immunoglobin G), Serum: 3450 mg/dL — ABNORMAL HIGH (ref 690–1700)

## 2014-03-28 LAB — KAPPA/LAMBDA LIGHT CHAINS
Kappa free light chain: 4.23 mg/dL — ABNORMAL HIGH (ref 0.33–1.94)
Kappa:Lambda Ratio: 7.98 — ABNORMAL HIGH (ref 0.26–1.65)
LAMBDA FREE LGHT CHN: 0.53 mg/dL — AB (ref 0.57–2.63)

## 2014-03-28 LAB — PROTEIN ELECTROPHORESIS, SERUM, WITH REFLEX
ALPHA-1-GLOBULIN: 3.4 % (ref 2.9–4.9)
Albumin ELP: 45 % — ABNORMAL LOW (ref 55.8–66.1)
Alpha-2-Globulin: 7.2 % (ref 7.1–11.8)
BETA 2: 3.4 % (ref 3.2–6.5)
Beta Globulin: 3.9 % — ABNORMAL LOW (ref 4.7–7.2)
GAMMA GLOBULIN: 37.1 % — AB (ref 11.1–18.8)
M-Spike, %: 2.92 g/dL
Total Protein, Serum Electrophoresis: 9 g/dL — ABNORMAL HIGH (ref 6.0–8.3)

## 2014-03-28 LAB — COMPREHENSIVE METABOLIC PANEL
ALT: 17 U/L (ref 0–35)
AST: 18 U/L (ref 0–37)
Albumin: 3.8 g/dL (ref 3.5–5.2)
Alkaline Phosphatase: 51 U/L (ref 39–117)
BILIRUBIN TOTAL: 0.4 mg/dL (ref 0.2–1.2)
BUN: 22 mg/dL (ref 6–23)
CHLORIDE: 99 meq/L (ref 96–112)
CO2: 26 meq/L (ref 19–32)
Calcium: 9.5 mg/dL (ref 8.4–10.5)
Creatinine, Ser: 0.97 mg/dL (ref 0.50–1.10)
GLUCOSE: 87 mg/dL (ref 70–99)
Potassium: 4 mEq/L (ref 3.5–5.3)
SODIUM: 133 meq/L — AB (ref 135–145)
Total Protein: 9 g/dL — ABNORMAL HIGH (ref 6.0–8.3)

## 2014-03-28 LAB — LACTATE DEHYDROGENASE: LDH: 169 U/L (ref 94–250)

## 2014-03-28 LAB — IFE INTERPRETATION

## 2014-04-03 ENCOUNTER — Telehealth: Payer: Self-pay | Admitting: Hematology & Oncology

## 2014-04-03 NOTE — Telephone Encounter (Signed)
[employer] Disability papers completed and faxed again along w med records today to:   Leave ID: [id number]   F: [phone] P : [phone]     COPY SCANNED

## 2014-04-16 ENCOUNTER — Ambulatory Visit (HOSPITAL_COMMUNITY)
Admission: RE | Admit: 2014-04-16 | Discharge: 2014-04-16 | Disposition: A | Discharge status: Home / Self Care | Payer: 59 | Source: Ambulatory Visit | Attending: Hematology & Oncology | Admitting: Hematology & Oncology

## 2014-04-16 ENCOUNTER — Other Ambulatory Visit: Payer: Self-pay | Admitting: Hematology & Oncology

## 2014-04-16 DIAGNOSIS — M545 Low back pain, unspecified: Secondary | ICD-10-CM

## 2014-04-16 DIAGNOSIS — C9000 Multiple myeloma not having achieved remission: Secondary | ICD-10-CM

## 2014-04-16 DIAGNOSIS — C7989 Secondary malignant neoplasm of other specified sites: Secondary | ICD-10-CM | POA: Insufficient documentation

## 2014-04-16 MED ORDER — GADOBENATE DIMEGLUMINE 529 MG/ML IV SOLN
19.0000 mL | Freq: Once | INTRAVENOUS | Status: AC | PRN
  Administered 2014-04-16: 19 mL via INTRAVENOUS

## 2014-04-17 ENCOUNTER — Telehealth: Payer: Self-pay | Admitting: Hematology & Oncology

## 2014-04-17 NOTE — Telephone Encounter (Signed)
I left a phone message for Barbara Cook area and I told her about the results of the MRI. She clearly has progressive disease at T12. There is progression elsewhere. I told her that we have to do something to try to stop this. If not, then she could easily develop a permanent neurologic problem. She can develop a significant compression fracture that could injure her spinal cord.  I told her that I would definitely recommend radiation treatments. If she did not want radiation then she would have to have chemotherapy.  I realize that she is trying an alternative approach to her myeloma. However, I think the MRI is clearly suggestive of her disease staying active.  I told her to call us back to let us know which she thinks.  [name]

## 2014-04-24 NOTE — Progress Notes (Signed)
Histology and Location of Primary Cancer: IgG kappa myeloma   Sites of Visceral and Bony Metastatic Disease: MRI from 04/16/14 shows "Progression of T12 metastatic deposit with early pathologic compression deformity. "  Location(s) of Symptomatic Metastases: T12  Past/Anticipated chemotherapy by medical oncology, if any: Possible Zometa once a month  Pain on a scale of 0-10 is: currently rating pain a 0, but reports lower back pain daily, reports the curamin helps with pain.   If Spine Met(s), symptoms, if any, include:  Bowel/Bladder retention or incontinence (please describe): no - patient has a history of IBS so has occasional symptoms with diarrhea and constipation.  Numbness or weakness in extremities (please describe): reports weakness over lower extremities  Current Decadron regimen, if applicable: no  Ambulatory status? Walker? Wheelchair?: Ambulatory - uses a cane in the mornings due to polymyalgia rheumatica  SAFETY ISSUES:  Prior radiation? no  Pacemaker/ICD? no  Possible current pregnancy? no  Is the patient on methotrexate? no  Current Complaints / other details: Patient is here with her husband. Pt currently isn't in pain and is free of complaints.

## 2014-04-25 ENCOUNTER — Ambulatory Visit
Admission: RE | Admit: 2014-04-25 | Discharge: 2014-04-25 | Disposition: A | Discharge status: Home / Self Care | Payer: 59 | Source: Ambulatory Visit | Attending: Radiation Oncology | Admitting: Radiation Oncology

## 2014-04-25 ENCOUNTER — Encounter: Payer: Self-pay | Admitting: Oncology

## 2014-04-25 VITALS — BP 130/73 | HR 97 | Temp 97.6°F | Resp 12 | Wt 200.4 lb

## 2014-04-25 DIAGNOSIS — Z510 Encounter for antineoplastic radiation therapy: Secondary | ICD-10-CM | POA: Diagnosis not present

## 2014-04-25 DIAGNOSIS — C9000 Multiple myeloma not having achieved remission: Secondary | ICD-10-CM

## 2014-04-25 NOTE — Progress Notes (Signed)
Please see the Nurse Progress Note in the MD Initial Consult Encounter for this patient. 

## 2014-04-25 NOTE — Progress Notes (Signed)
Radiation Oncology         [phone] ________________________________  Name: Barbara Cook MRN: [id number]  Date: 04/25/2014  DOB: 1955-07-19  Re-evaluation Note  CC: Horatio Pel, MD  [name], MD    ICD-9-CM ICD-10-CM   1. Multiple myeloma 203.00 C90.00     Diagnosis: IgG kappa myeloma      Narrative:  The patient returns today for evaluation at the courtesy of Dr. Marin Olp.  The patient was initially seen in consultation on October 21 for consideration for treatment directed at her lower thoracic spine area. The patient elected at that time not to proceed with any treatment. She has been undergoing alternative therapy. Patient has started having some pain in her lower back and recent MRI of the lumbar spine shows progression of disease at the T12 area with a pathologic fracture. In light of patient's pain and x-ray findings she is now referred to radiation oncology for consideration for palliative treatment.                            ALLERGIES:  is allergic to codeine.  Meds: Current Outpatient Prescriptions  Medication Sig Dispense Refill  . acetaminophen (TYLENOL) 650 MG CR tablet Take 1,300 mg by mouth every 8 (eight) hours as needed for pain.     . Bromelains 500 MG TABS Take 1 tablet by mouth daily.    . BucAlfAspKGlucCouchParsUvaUrJu (WATER PILLS PO) Take 1 tablet by mouth 3 (three) times daily.    . Chlorophyll (CHLOROXYGEN PO) Take 36 drops by mouth. Takes 36 drops twice a day    . Cholecalciferol (VITAMIN D) 1000 UNITS capsule Take 1,000 Units by mouth daily. 2 IN AM AND 2 IN PM    . Magnesium 250 MG TABS Take 1 tablet by mouth 2 (two) times daily.     . Multiple Vitamin (MULTIVITAMIN) tablet Take 1 tablet by mouth every evening.     . NON FORMULARY 3 capsules 3 (three) times daily. Acid-2-alkaline    . Noni, Morinda citrifolia, (NONI JUICE PO) Take 1 oz by mouth daily.    Marland Kitchen OVER THE COUNTER MEDICATION 3 capsules 2 (two) times daily. curamin    .  torsemide (DEMADEX) 20 MG tablet Take 40 mg by mouth as needed.      No current facility-administered medications for this encounter.    Physical Findings: The patient is in no acute distress. Patient is alert and oriented.  weight is 200 lb 6.4 oz (90.901 kg). Her oral temperature is 97.6 F (36.4 C). Her blood pressure is 130/73 and her pulse is 97. Her respiration is 12 and oxygen saturation is 98%. .  The palpable supraclavicular or axillary adenopathy. the lungs are clear to auscultation. The heart has a regular rhythm and rate. The abdomen is soft and nontender with normal bowel sounds. On neurological examination motor strength is 5 out of 5 in the proximal and distal muscle groups of the lower extremities. Palpation along the back area reveals no obvious point tenderness.  Lab Findings: Lab Results  Component Value Date   WBC 5.0 03/26/2014   HGB 10.4* 03/26/2014   HCT 31.6* 03/26/2014   MCV 91 03/26/2014   PLT 159 03/26/2014    Radiographic Findings: Mr Thoracic Spine W Wo Contrast  04/16/2014   CLINICAL DATA:  Myeloma.  Increasing back pain.  EXAM: MRI THORACIC AND LUMBAR SPINE WITHOUT AND WITH CONTRAST  TECHNIQUE: Multiplanar and multiecho  pulse sequences of the thoracic and lumbar spine were obtained without and with intravenous contrast.  CONTRAST:  67m MULTIHANCE GADOBENATE DIMEGLUMINE 529 MG/ML IV SOLN  COMPARISON:  Outside MRI from [facility] 01/30/2014. Bone survey 02/15/2014.  FINDINGS: MR THORACIC SPINE FINDINGS  Numbering of the thoracic spine was performed by counting down from C2. 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  There is progressive enlargement of the myeloma lesion at T12, with early pathologic compression. This lesion involves most of the T12 vertebral body and measures 28 x 24 x 19 mm cross-section. There is loss of integrity of the posterior endplate at TQ25on the RIGHT with tumor extending into the pedicle, also on the RIGHT. Slight RIGHT  ventral epidural tumor without spinal stenosis or neural impingement.  At T10, there is a slightly greater than 1 cm lesion in the anterior inferior aspect of that vertebral body without visible compression deformity. This has progressed in comparison with the prior study.  No significant thoracic disc protrusion or spinal stenosis. No abnormal cord signal or intraspinal enhancement.  MR LUMBAR SPINE FINDINGS  There is a 5 mm lesion in the midportion of S1 representing tumor. No other definite lumbar lesions or sacral lesions. Lower lumbar facet arthropathy without disc protrusion or spinal stenosis. Probable uterine enlargement in the pelvis incompletely evaluated. No intraspinal enhancement.  IMPRESSION: Progression of T12 metastatic deposit with early pathologic compression deformity. Slight right-sided ventral epidural tumor without neural impingement or stenosis.  Greater than 1 cm lesion in the anterior inferior aspect of T10 which has progressed from the prior study.  5 mm lesion in S1, not observed previously, without areas of lumbar epidural tumor or spinal stenosis.   Electronically Signed   By: JRolla FlattenM.D.   On: 04/16/2014 18:10   Mr Lumbar Spine W Wo Contrast  04/16/2014   CLINICAL DATA:  Myeloma.  Increasing back pain.  EXAM: MRI THORACIC AND LUMBAR SPINE WITHOUT AND WITH CONTRAST  TECHNIQUE: Multiplanar and multiecho pulse sequences of the thoracic and lumbar spine were obtained without and with intravenous contrast.  CONTRAST:  182mMULTIHANCE GADOBENATE DIMEGLUMINE 529 MG/ML IV SOLN  COMPARISON:  Outside MRI from [facility] 01/30/2014. Bone survey 02/15/2014.  FINDINGS: MR THORACIC SPINE FINDINGS  Numbering of the thoracic spine was performed by counting down from C2. 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  There is progressive enlargement of the myeloma lesion at T12, with early pathologic compression. This lesion involves most of the T12 vertebral body and measures 28  x 24 x 19 mm cross-section. There is loss of integrity of the posterior endplate at [id number] the RIGHT with tumor extending into the pedicle, also on the RIGHT. Slight RIGHT ventral epidural tumor without spinal stenosis or neural impingement.  At T10, there is a slightly greater than 1 cm lesion in the anterior inferior aspect of that vertebral body without visible compression deformity. This has progressed in comparison with the prior study.  No significant thoracic disc protrusion or spinal stenosis. No abnormal cord signal or intraspinal enhancement.  MR LUMBAR SPINE FINDINGS  There is a 5 mm lesion in the midportion of S1 representing tumor. No other definite lumbar lesions or sacral lesions. Lower lumbar facet arthropathy without disc protrusion or spinal stenosis. Probable uterine enlargement in the pelvis incompletely evaluated. No intraspinal enhancement.  IMPRESSION: Progression of T12 metastatic deposit with early pathologic compression deformity. Slight right-sided ventral epidural tumor without neural impingement or stenosis.  Greater than 1 cm lesion in the anterior inferior aspect of T10 which has progressed from the prior study.  5 mm lesion in S1, not observed previously, without areas of lumbar epidural tumor or spinal stenosis.   Electronically Signed   By: Rolla Flatten M.D.   On: 04/16/2014 18:10    Impression:  IgG kappa myeloma. Patient has had progression in the T12 and T10 areas. Patient is symptomatic at this time and does wish to proceed with palliative radiation therapy. I discussed the treatment course side effects and potential toxicities of radiation therapy in this situation with the patient and her husband. She appears to understand and wishes to proceed with planned course of treatment.  Plan:  Simulation and planning today with treatments to begin next week. Anticipate 10 treatments directed at the T10 through the T12 thoracic spine  area.  ____________________________________ Blair Promise, MD

## 2014-04-26 ENCOUNTER — Other Ambulatory Visit: Payer: Self-pay | Admitting: *Deleted

## 2014-04-26 ENCOUNTER — Ambulatory Visit (HOSPITAL_BASED_OUTPATIENT_CLINIC_OR_DEPARTMENT_OTHER): Payer: 59 | Admitting: Hematology & Oncology

## 2014-04-26 ENCOUNTER — Encounter: Payer: Self-pay | Admitting: Hematology & Oncology

## 2014-04-26 ENCOUNTER — Ambulatory Visit (HOSPITAL_BASED_OUTPATIENT_CLINIC_OR_DEPARTMENT_OTHER): Payer: 59 | Admitting: Lab

## 2014-04-26 VITALS — BP 125/64 | HR 85 | Temp 97.7°F | Resp 14 | Ht 64.0 in | Wt 199.0 lb

## 2014-04-26 DIAGNOSIS — C9000 Multiple myeloma not having achieved remission: Secondary | ICD-10-CM

## 2014-04-26 LAB — CBC WITH DIFFERENTIAL (CANCER CENTER ONLY)
BASO#: 0 10*3/uL (ref 0.0–0.2)
BASO%: 0.5 % (ref 0.0–2.0)
EOS%: 1.7 % (ref 0.0–7.0)
Eosinophils Absolute: 0.1 10*3/uL (ref 0.0–0.5)
HEMATOCRIT: 32.2 % — AB (ref 34.8–46.6)
HGB: 10.6 g/dL — ABNORMAL LOW (ref 11.6–15.9)
LYMPH#: 2 10*3/uL (ref 0.9–3.3)
LYMPH%: 48.1 % — ABNORMAL HIGH (ref 14.0–48.0)
MCH: 29.8 pg (ref 26.0–34.0)
MCHC: 32.9 g/dL (ref 32.0–36.0)
MCV: 90 fL (ref 81–101)
MONO#: 0.5 10*3/uL (ref 0.1–0.9)
MONO%: 11.9 % (ref 0.0–13.0)
NEUT#: 1.5 10*3/uL (ref 1.5–6.5)
NEUT%: 37.8 % — ABNORMAL LOW (ref 39.6–80.0)
Platelets: 164 10*3/uL (ref 145–400)
RBC: 3.56 10*6/uL — ABNORMAL LOW (ref 3.70–5.32)
RDW: 14.3 % (ref 11.1–15.7)
WBC: 4.1 10*3/uL (ref 3.9–10.0)

## 2014-04-26 LAB — CMP (CANCER CENTER ONLY)
ALBUMIN: 3.6 g/dL (ref 3.3–5.5)
ALT(SGPT): 29 U/L (ref 10–47)
AST: 28 U/L (ref 11–38)
Alkaline Phosphatase: 50 U/L (ref 26–84)
BUN, Bld: 18 mg/dL (ref 7–22)
CO2: 28 mEq/L (ref 18–33)
Calcium: 10 mg/dL (ref 8.0–10.3)
Chloride: 103 mEq/L (ref 98–108)
Creat: 1.1 mg/dl (ref 0.6–1.2)
GLUCOSE: 86 mg/dL (ref 73–118)
POTASSIUM: 3.9 meq/L (ref 3.3–4.7)
SODIUM: 143 meq/L (ref 128–145)
TOTAL PROTEIN: 9.4 g/dL — AB (ref 6.4–8.1)
Total Bilirubin: 0.6 mg/dl (ref 0.20–1.60)

## 2014-04-26 MED ORDER — LENALIDOMIDE 25 MG PO CAPS: ORAL_CAPSULE | ORAL | Status: DC

## 2014-04-26 MED ORDER — FAMCICLOVIR 500 MG PO TABS: ORAL_TABLET | ORAL | Status: DC

## 2014-04-26 MED ORDER — FAMCICLOVIR 500 MG PO TABS: 500.0000 mg | ORAL_TABLET | Freq: Every day | ORAL | Status: DC

## 2014-04-26 NOTE — Addendum Note (Signed)
Encounter addended by: Jacqulyn Liner, RN on: 106/02/202015  9:07 AM<BR>     Documentation filed: Charges VN

## 2014-04-26 NOTE — Progress Notes (Signed)
Hematology and Oncology Follow Up Visit  Barbara Cook [id number] November 22, 1955 58 y.o. [id number]   Principle Diagnosis:  IgG kappa myeloma   Current Cook:    Patient to start radiation Cook to a T12 lesion  Patient to start Velcade/Revlimid post radiation  Zometa 4 mg IV every month     Interim History:  Ms.  Cook is back for follow-up. It is clear that Barbara Cook myeloma is progressing. We did do an MRI of Barbara Cook back. Barbara Cook has more disease in T12. Barbara Cook has a compression fracture. Barbara Cook has a little bit more pain. Barbara Cook has agreed to radiation treatments. Barbara Cook has seen radiation oncology. They will start Barbara Cook next week with 10 treatments.  Barbara spent about 45 minutes with Barbara Cook and Barbara Cook husband. Barbara Cook that the myeloma is not curable but that it is clearly treatable. Barbara Cook that we probably need to do something that we'll treat Barbara Cook entire body.  We last saw Barbara Cook, Barbara Cook was up to 2.92 g/L. Barbara Cook IgG level was 3450 mg/dL. Barbara Cook kappa light chain was 4.23 mg/dL.  Barbara talked to them about Velcade and Revlimid. Barbara gave them information about each medicine. Barbara told Barbara Cook that treatment is about 90% effective. We would know if treatment was working by improvement in Barbara Cook lab work. Barbara Cook that we do not have to keep doing x-rays.  Barbara Cook. Barbara don't think this to be a problem with Barbara Cook doing that with the medications that we will use.  Barbara Cook is not working. Barbara Cook has had no problems going to the bathroom. Barbara Cook has had no cough.  Barbara Cook to take 1 full dose aspirin daily with the Revlimid. If Barbara gave Barbara Cook a pursue for Famvir 500 mg to be taken daily.  Medications: Current outpatient prescriptions: acetaminophen (TYLENOL) 650 MG CR tablet, Take 1,300 mg by mouth every 8 (eight) hours as needed for pain. , Disp: , Rfl: ;  BucAlfAspKGlucCouchParsUvaUrJu (WATER PILLS PO), Take 1 tablet by mouth 3 (three) times daily., Disp: , Rfl: ;  Chlorophyll (CHLOROXYGEN PO), Take 36 drops by  mouth. Takes 36 drops twice a day, Disp: , Rfl:  Cholecalciferol (VITAMIN D) 1000 UNITS capsule, Take 1,000 Units by mouth daily. 2 IN AM AND 2 IN PM, Disp: , Rfl: ;  Magnesium 250 MG TABS, Take 1 tablet by mouth 2 (two) times daily. , Disp: , Rfl: ;  Multiple Vitamin (MULTIVITAMIN) tablet, Take 1 tablet by mouth every evening. , Disp: , Rfl: ;  NON FORMULARY, 3 capsules 3 (three) times daily. Acid-2-alkaline, Disp: , Rfl:  Noni, Morinda citrifolia, (NONI JUICE PO), Take 1 oz by mouth daily., Disp: , Rfl: ;  OVER THE COUNTER MEDICATION, 3 capsules 2 (two) times daily. curamin, Disp: , Rfl: ;  torsemide (DEMADEX) 20 MG tablet, Take 40 mg by mouth as needed. , Disp: , Rfl: ;  famciclovir (FAMVIR) 500 MG tablet, Take 1 pill daily., Disp: 30 tablet, Rfl: 0 lenalidomide (REVLIMID) 25 MG capsule, Take 1 capsule at bedtime daily for 21 days and then off for 7 days., Disp: 21 capsule, Rfl: 6  Allergies:  Allergies  Allergen Reactions  . Codeine Palpitations    Past Medical History, Surgical history, Social history, and Family History were reviewed and updated.  Review of Systems: As above  Physical Exam:  height is 5\' 4"  (1.626 m) and weight is 199 lb (90.266 kg). Barbara Cook oral temperature is 97.7 F (36.5  C). Barbara Cook blood pressure is 125/64 and Barbara Cook pulse is 85. Barbara Cook respiration is 14.   Well-developed and well-nourished African American female. Head and neck exam shows no ocular or oral lesions. There are no palpable cervical or supraclavicular lymph nodes. Lungs are clear. Cardiac exam regular rate and rhythm with no murmurs, rubs or bruits. Abdomen is soft. Barbara Cook is mildly obese. Barbara Cook is no palpable liver or spleen tip. Back exam shows no tenderness over the spine, ribs or hips. Extremity shows no clubbing, cyanosis or edema. Skin exam shows no rashes, ecchymoses or petechia. Neurological exam is nonfocal.  Lab Results  Component Value Date   WBC 4.1 101-23-2015   HGB 10.6* 101-23-2015   HCT 32.2* 101-23-2015     MCV 90 101-23-2015   PLT 164 101-23-2015     Chemistry      Component Value Date/Time   NA 143 101-23-2015 0821   NA 133* 03/26/2014 1027   K 3.9 101-23-2015 0821   K 4.0 03/26/2014 1027   CL 103 101-23-2015 0821   CL 99 03/26/2014 1027   CO2 28 101-23-2015 0821   CO2 26 03/26/2014 1027   BUN 18 101-23-2015 0821   BUN 22 03/26/2014 1027   CREATININE 1.1 101-23-2015 0821   CREATININE 0.97 03/26/2014 1027      Component Value Date/Time   CALCIUM 10.0 101-23-2015 0821   CALCIUM 9.5 03/26/2014 1027   ALKPHOS 50 101-23-2015 0821   ALKPHOS 51 03/26/2014 1027   AST 28 101-23-2015 0821   AST 18 03/26/2014 1027   ALT 29 101-23-2015 0821   ALT 17 03/26/2014 1027   BILITOT 0.60 101-23-2015 0821   BILITOT 0.4 03/26/2014 1027         Impression and Plan: Barbara Cook is 58 year old African-American female with IgG kappa myeloma. Barbara Cook is try to hold off on treatment as long as possible. However, we now route the point where we have to embark upon treatment before Barbara Cook starts to have significant and possibly permanent consultations.  Barbara think Barbara Cook will do very well. Barbara think Barbara Cook will respond nicely. Again, Barbara Cook and Barbara Cook husband that Barbara Cook has a disease that is not curable but that clearly is treatable.  Barbara suppose one option might be a stem cell transplant but Barbara seriously doubt that Barbara Cook would ever consider something like this as Barbara Cook has been very reluctant to do any treatment.  We will start with treatment in January. I do we can hold off after the holidays.  Again, Barbara spent 45 minutes with Barbara Cook and Barbara Cook husband.  We will see Barbara Cook back the day Barbara Cook starts Barbara Cook treatment.  Barbara, again, reviewed all the side effects of treatment. Barbara Cook understands these.   [name], MD [id number] AM

## 2014-04-29 ENCOUNTER — Encounter: Payer: Self-pay | Admitting: *Deleted

## 2014-04-29 DIAGNOSIS — Z510 Encounter for antineoplastic radiation therapy: Secondary | ICD-10-CM | POA: Diagnosis not present

## 2014-04-29 NOTE — Progress Notes (Signed)
Received call from [facility] stating that they are not in network. They will be transferring the prescription to [facility].

## 2014-04-30 ENCOUNTER — Ambulatory Visit
Admission: RE | Admit: 2014-04-30 | Discharge: 2014-04-30 | Disposition: A | Discharge status: Home / Self Care | Payer: 59 | Source: Ambulatory Visit | Attending: Radiation Oncology | Admitting: Radiation Oncology

## 2014-04-30 ENCOUNTER — Encounter: Payer: Self-pay | Admitting: Radiation Oncology

## 2014-04-30 VITALS — BP 124/68 | HR 86 | Temp 97.4°F | Resp 12 | Ht 64.0 in | Wt 203.0 lb

## 2014-04-30 DIAGNOSIS — C9000 Multiple myeloma not having achieved remission: Secondary | ICD-10-CM | POA: Diagnosis not present

## 2014-04-30 DIAGNOSIS — Z510 Encounter for antineoplastic radiation therapy: Secondary | ICD-10-CM | POA: Diagnosis not present

## 2014-04-30 LAB — IGG, IGA, IGM
IgA: 16 mg/dL — ABNORMAL LOW (ref 69–380)
IgG (Immunoglobin G), Serum: 3240 mg/dL — ABNORMAL HIGH (ref 690–1700)
IgM, Serum: 7 mg/dL — ABNORMAL LOW (ref 52–322)

## 2014-04-30 LAB — PROTEIN ELECTROPHORESIS, SERUM, WITH REFLEX
ALBUMIN ELP: 45.2 % — AB (ref 55.8–66.1)
ALPHA-2-GLOBULIN: 7.2 % (ref 7.1–11.8)
Alpha-1-Globulin: 3.2 % (ref 2.9–4.9)
BETA GLOBULIN: 4.1 % — AB (ref 4.7–7.2)
Beta 2: 2.9 % — ABNORMAL LOW (ref 3.2–6.5)
Gamma Globulin: 37.4 % — ABNORMAL HIGH (ref 11.1–18.8)
M-Spike, %: 3.03 g/dL
TOTAL PROTEIN, SERUM ELECTROPHOR: 9.5 g/dL — AB (ref 6.0–8.3)

## 2014-04-30 LAB — IFE INTERPRETATION

## 2014-04-30 LAB — KAPPA/LAMBDA LIGHT CHAINS
Kappa free light chain: 11 mg/dL — ABNORMAL HIGH (ref 0.33–1.94)
Kappa:Lambda Ratio: 68.75 — ABNORMAL HIGH (ref 0.26–1.65)
Lambda Free Lght Chn: 0.16 mg/dL — ABNORMAL LOW (ref 0.57–2.63)

## 2014-04-30 MED ORDER — RADIAPLEXRX EX GEL
Freq: Once | CUTANEOUS | Status: AC
  Administered 2014-04-30: 17:00:00 via TOPICAL

## 2014-04-30 NOTE — Addendum Note (Signed)
Encounter addended by: Blair Promise, MD on: 04/30/2014  9:02 PM<BR>     Documentation filed: Notes Section

## 2014-04-30 NOTE — Progress Notes (Signed)
  Radiation Oncology         [phone] ________________________________  Name: Barbara Cook MRN: [id number]  Date: 04/30/2014  DOB: 1955-07-15  Simulation Verification Note    ICD-9-CM ICD-10-CM   1. Multiple myeloma 203.00 C90.00     Status: outpatient  NARRATIVE: The patient was brought to the treatment unit and placed in the planned treatment position. The clinical setup was verified. Then port films were obtained and uploaded to the radiation oncology medical record software.  The treatment beams were carefully compared against the planned radiation fields. The position location and shape of the radiation fields was reviewed. They targeted volume of tissue appears to be appropriately covered by the radiation beams. Organs at risk appear to be excluded as planned.  Based on my personal review, I approved the simulation verification. The patient's treatment will proceed as planned.  -----------------------------------  Blair Promise, PhD, MD

## 2014-04-30 NOTE — Progress Notes (Signed)
  Radiation Oncology         [phone] ________________________________  Name: Barbara Cook MRN: [id number]  Date: 04/30/2014  DOB: [birth year]  Weekly Radiation Therapy Management  Diagnosis: Multiple myeloma  Current Dose: 2.5 Gy     Planned Dose:  25 Gy directed at the T9-L1 spine  Narrative . . . . . . . . The patient presents for routine under treatment assessment.                                   The patient is without complaint.                                 Set-up films were reviewed.                                 The chart was checked. Physical Findings. .  Weight essentially stable.  The lungs are clear. The heart has a regular rhythm and rate. The abdomen is soft and nontender with normal bowel sounds. Impression . . . . . . . The patient is tolerating radiation. Plan . . . . . . . . . . . . Continue treatment as planned.  ________________________________   Blair Promise, PhD, MD

## 2014-04-30 NOTE — Progress Notes (Signed)
Barbara Cook has completed 1 fraction to her t9-l1 spine.  She reports pain in her lower back at a 5/10.  She is taking tylenol as needed.  She does not have any complaints today.   She was given the Radiation Therapy and You book and discussed potential side effects including fatigue, skin changes, nausea and diarrhea.  She was given the skin care handout as well as radiaplex gel.  She was instructed to apply the radiaplex gel to her abdomen and lower back twice a day after treatment and bedtime.

## 2014-05-01 ENCOUNTER — Telehealth: Payer: Self-pay | Admitting: Oncology

## 2014-05-01 ENCOUNTER — Ambulatory Visit: Payer: 59

## 2014-05-01 NOTE — Telephone Encounter (Signed)
Barbara Cook did call and cancel treatment.  Called her to see how she is feeling.  She reports having 2 episodes of diarrhea today.  She has taken 2 Imodium.  She states that she does not have a fever.  Advised her to push fluids today and to eat a mild diet (brat diet) and to wait and see if the Imodium works.  Advised her to follow the package instructions on the Imodium.  Barbara Cook verbalized agreement.

## 2014-05-01 NOTE — Addendum Note (Signed)
Encounter addended by: Jacqulyn Liner, RN on: 05/01/2014 12:34 PM<BR>     Documentation filed: Medications

## 2014-05-01 NOTE — Telephone Encounter (Signed)
Barbara Cook called and said she has started having diarrhea this morning.  She said she has had one episode of diarrhea and if she walks around the house she feels like it is going to happen again.  Advised her to try taking Imodium to see if this helps.  Barbara Cook said she does not have any but is going to send her husband to get some and will try taking it before treatment at 11:45 today.  Advised her to call if the Imodium does not help and she needs to cancel treatment.  Barbara Cook verbalized agreement.

## 2014-05-02 ENCOUNTER — Ambulatory Visit
Admission: RE | Admit: 2014-05-02 | Discharge: 2014-05-02 | Disposition: A | Discharge status: Home / Self Care | Payer: 59 | Source: Ambulatory Visit | Attending: Radiation Oncology | Admitting: Radiation Oncology

## 2014-05-02 DIAGNOSIS — Z510 Encounter for antineoplastic radiation therapy: Secondary | ICD-10-CM | POA: Diagnosis not present

## 2014-05-06 ENCOUNTER — Ambulatory Visit
Admission: RE | Admit: 2014-05-06 | Discharge: 2014-05-06 | Disposition: A | Discharge status: Home / Self Care | Payer: 59 | Source: Ambulatory Visit | Attending: Radiation Oncology | Admitting: Radiation Oncology

## 2014-05-06 DIAGNOSIS — Z510 Encounter for antineoplastic radiation therapy: Secondary | ICD-10-CM | POA: Diagnosis not present

## 2014-05-07 ENCOUNTER — Ambulatory Visit
Admission: RE | Admit: 2014-05-07 | Discharge: 2014-05-07 | Disposition: A | Discharge status: Home / Self Care | Payer: 59 | Source: Ambulatory Visit | Attending: Radiation Oncology | Admitting: Radiation Oncology

## 2014-05-07 ENCOUNTER — Encounter: Payer: Self-pay | Admitting: Radiation Oncology

## 2014-05-07 VITALS — BP 127/63 | HR 80 | Temp 97.5°F | Resp 20 | Wt 197.8 lb

## 2014-05-07 DIAGNOSIS — Z510 Encounter for antineoplastic radiation therapy: Secondary | ICD-10-CM | POA: Diagnosis not present

## 2014-05-07 DIAGNOSIS — C9000 Multiple myeloma not having achieved remission: Secondary | ICD-10-CM

## 2014-05-07 NOTE — Progress Notes (Signed)
Weekly Management Note Current Dose:  10 Gy  Projected Dose: 25 Gy   Narrative:  The patient presents for routine under treatment assessment.  CBCT/MVCT images/Port film x-rays were reviewed.  The chart was checked. Doing well. No pain. No dysphagia.   Physical Findings: Weight: 197 lb 12.8 oz (89.721 kg). Unchanged  Impression:  The patient is tolerating radiation.  Plan:  Continue treatment as planned.

## 2014-05-07 NOTE — Progress Notes (Signed)
Patient denies pain, difficulty eating, painful swallowing, fatigue, loss of appetite. She is applying lotion to treatment area; no skin changes at this time.

## 2014-05-08 ENCOUNTER — Ambulatory Visit
Admission: RE | Admit: 2014-05-08 | Discharge: 2014-05-08 | Disposition: A | Discharge status: Home / Self Care | Payer: 59 | Source: Ambulatory Visit | Attending: Radiation Oncology | Admitting: Radiation Oncology

## 2014-05-08 DIAGNOSIS — Z510 Encounter for antineoplastic radiation therapy: Secondary | ICD-10-CM | POA: Diagnosis not present

## 2014-05-09 ENCOUNTER — Other Ambulatory Visit: Payer: 59 | Admitting: Lab

## 2014-05-09 ENCOUNTER — Ambulatory Visit: Payer: 59 | Admitting: Hematology & Oncology

## 2014-05-09 ENCOUNTER — Ambulatory Visit
Admission: RE | Admit: 2014-05-09 | Discharge: 2014-05-09 | Disposition: A | Discharge status: Home / Self Care | Payer: 59 | Source: Ambulatory Visit | Attending: Radiation Oncology | Admitting: Radiation Oncology

## 2014-05-09 DIAGNOSIS — Z510 Encounter for antineoplastic radiation therapy: Secondary | ICD-10-CM | POA: Diagnosis not present

## 2014-05-13 ENCOUNTER — Ambulatory Visit
Admission: RE | Admit: 2014-05-13 | Discharge: 2014-05-13 | Disposition: A | Discharge status: Home / Self Care | Payer: 59 | Source: Ambulatory Visit | Attending: Radiation Oncology | Admitting: Radiation Oncology

## 2014-05-13 DIAGNOSIS — Z510 Encounter for antineoplastic radiation therapy: Secondary | ICD-10-CM | POA: Diagnosis not present

## 2014-05-14 ENCOUNTER — Encounter: Payer: Self-pay | Admitting: Radiation Oncology

## 2014-05-14 ENCOUNTER — Ambulatory Visit
Admission: RE | Admit: 2014-05-14 | Discharge: 2014-05-14 | Disposition: A | Discharge status: Home / Self Care | Payer: 59 | Source: Ambulatory Visit | Attending: Radiation Oncology | Admitting: Radiation Oncology

## 2014-05-14 ENCOUNTER — Ambulatory Visit
Admission: RE | Admit: 2014-05-14 | Discharge: 2014-05-14 | Disposition: A | Discharge status: Home / Self Care | Payer: Self-pay | Source: Ambulatory Visit | Attending: Radiation Oncology | Admitting: Radiation Oncology

## 2014-05-14 VITALS — BP 131/75 | HR 73 | Temp 97.7°F | Ht 64.0 in | Wt 197.2 lb

## 2014-05-14 DIAGNOSIS — Z510 Encounter for antineoplastic radiation therapy: Secondary | ICD-10-CM | POA: Diagnosis not present

## 2014-05-14 DIAGNOSIS — C9000 Multiple myeloma not having achieved remission: Secondary | ICD-10-CM

## 2014-05-14 NOTE — Progress Notes (Signed)
  Radiation Oncology         [phone] ________________________________  Name: Barbara Cook MRN: [id number]  Date: 05/14/2014  DOB: [birth year]  Weekly Radiation Therapy Management  Diagnosis: Multiple myeloma  Current Dose: 20 Gy     Planned Dose:  25 Gy  Narrative . . . . . . . . The patient presents for routine under treatment assessment.                                   The patient is without complaint. Her pain is improved at this time and is essentially resolved. She has some mild swallowing difficulties but does not want to start medication for this issues as she will complete her therapy in 2 days                                 Set-up films were reviewed.                                 The chart was checked. Physical Findings. . .  height is $RemoveB'5\' 4"'YpvtBUXm$  (1.626 m) and weight is 197 lb 3.2 oz (89.449 kg). Her oral temperature is 97.7 F (36.5 C). Her blood pressure is 131/75 and her pulse is 73. . The lungs are clear. The heart has a regular rhythm and rate your the abdomen is soft and nontender with normal bowel sounds. Impression . . . . . . . The patient is tolerating radiation. Plan . . . . . . . . . . . . Continue treatment as planned.  ________________________________   Blair Promise, PhD, MD

## 2014-05-14 NOTE — Progress Notes (Signed)
Barbara Cook has completed 8 fractions to her T9-L1 spine.  She denies pain.  She has noticed feeling her food go down in a "lump" when she swallows.  She denies nausea and diarrhea.  Her appetite is good and her weight is stable. She denies skin irritation and is using radiaplex.  She denies fatigue.  She reports that she is going to start her chemotherapy pill on Monday.

## 2014-05-15 ENCOUNTER — Other Ambulatory Visit: Payer: Self-pay | Admitting: Radiation Oncology

## 2014-05-15 ENCOUNTER — Ambulatory Visit: Payer: 59

## 2014-05-15 ENCOUNTER — Ambulatory Visit
Admission: RE | Admit: 2014-05-15 | Discharge: 2014-05-15 | Disposition: A | Discharge status: Home / Self Care | Payer: 59 | Source: Ambulatory Visit | Attending: Radiation Oncology | Admitting: Radiation Oncology

## 2014-05-15 DIAGNOSIS — Z510 Encounter for antineoplastic radiation therapy: Secondary | ICD-10-CM | POA: Diagnosis not present

## 2014-05-15 MED ORDER — SUCRALFATE 1 GM/10ML PO SUSP: 1.0000 g | Freq: Three times a day (TID) | ORAL | Status: DC

## 2014-05-15 NOTE — Progress Notes (Signed)
Barbara Cook came to nursing stating that she is having burning in her throat when she eats.  Gave her the esophagitis handout and advised her that Dr. Sondra Come will be notified and I will call her this afternoon.

## 2014-05-16 ENCOUNTER — Ambulatory Visit: Payer: 59

## 2014-05-16 ENCOUNTER — Ambulatory Visit
Admission: RE | Admit: 2014-05-16 | Discharge: 2014-05-16 | Disposition: A | Discharge status: Home / Self Care | Payer: 59 | Source: Ambulatory Visit | Attending: Radiation Oncology | Admitting: Radiation Oncology

## 2014-05-16 ENCOUNTER — Encounter: Payer: Self-pay | Admitting: *Deleted

## 2014-05-16 ENCOUNTER — Other Ambulatory Visit: Payer: PRIVATE HEALTH INSURANCE

## 2014-05-16 ENCOUNTER — Telehealth: Payer: Self-pay | Admitting: Oncology

## 2014-05-16 ENCOUNTER — Encounter: Payer: Self-pay | Admitting: Nurse Practitioner

## 2014-05-16 DIAGNOSIS — Z510 Encounter for antineoplastic radiation therapy: Secondary | ICD-10-CM | POA: Diagnosis not present

## 2014-05-16 NOTE — Telephone Encounter (Signed)
Called [name] and let her know that the [facility] [address]  Barbara Cook verbalized understanding.

## 2014-05-16 NOTE — Progress Notes (Signed)
Received notice from [facility] (fax # [phone]) that pt's Revlimid was dispensed on 05/13/14.

## 2014-05-17 ENCOUNTER — Ambulatory Visit: Payer: 59

## 2014-05-20 ENCOUNTER — Ambulatory Visit (HOSPITAL_BASED_OUTPATIENT_CLINIC_OR_DEPARTMENT_OTHER): Payer: 59 | Admitting: Family

## 2014-05-20 ENCOUNTER — Encounter: Payer: Self-pay | Admitting: *Deleted

## 2014-05-20 ENCOUNTER — Telehealth: Payer: Self-pay | Admitting: Hematology & Oncology

## 2014-05-20 ENCOUNTER — Ambulatory Visit: Payer: 59

## 2014-05-20 ENCOUNTER — Encounter: Payer: Self-pay | Admitting: Family

## 2014-05-20 ENCOUNTER — Other Ambulatory Visit (HOSPITAL_BASED_OUTPATIENT_CLINIC_OR_DEPARTMENT_OTHER): Payer: 59 | Admitting: Lab

## 2014-05-20 DIAGNOSIS — R109 Unspecified abdominal pain: Secondary | ICD-10-CM

## 2014-05-20 DIAGNOSIS — C9000 Multiple myeloma not having achieved remission: Secondary | ICD-10-CM

## 2014-05-20 LAB — CBC WITH DIFFERENTIAL (CANCER CENTER ONLY)
BASO#: 0 10*3/uL (ref 0.0–0.2)
BASO%: 0.3 % (ref 0.0–2.0)
EOS%: 1.8 % (ref 0.0–7.0)
Eosinophils Absolute: 0.1 10*3/uL (ref 0.0–0.5)
HCT: 29.4 % — ABNORMAL LOW (ref 34.8–46.6)
HEMOGLOBIN: 9.7 g/dL — AB (ref 11.6–15.9)
LYMPH#: 1 10*3/uL (ref 0.9–3.3)
LYMPH%: 28.9 % (ref 14.0–48.0)
MCH: 29.5 pg (ref 26.0–34.0)
MCHC: 33 g/dL (ref 32.0–36.0)
MCV: 89 fL (ref 81–101)
MONO#: 0.4 10*3/uL (ref 0.1–0.9)
MONO%: 13 % (ref 0.0–13.0)
NEUT#: 1.9 10*3/uL (ref 1.5–6.5)
NEUT%: 56 % (ref 39.6–80.0)
PLATELETS: 139 10*3/uL — AB (ref 145–400)
RBC: 3.29 10*6/uL — ABNORMAL LOW (ref 3.70–5.32)
RDW: 14.1 % (ref 11.1–15.7)
WBC: 3.3 10*3/uL — AB (ref 3.9–10.0)

## 2014-05-20 LAB — CMP (CANCER CENTER ONLY)
ALBUMIN: 3.1 g/dL — AB (ref 3.3–5.5)
ALT(SGPT): 22 U/L (ref 10–47)
AST: 22 U/L (ref 11–38)
Alkaline Phosphatase: 43 U/L (ref 26–84)
BILIRUBIN TOTAL: 0.5 mg/dL (ref 0.20–1.60)
BUN, Bld: 8 mg/dL (ref 7–22)
CO2: 27 meq/L (ref 18–33)
CREATININE: 0.7 mg/dL (ref 0.6–1.2)
Calcium: 9.1 mg/dL (ref 8.0–10.3)
Chloride: 106 mEq/L (ref 98–108)
Glucose, Bld: 101 mg/dL (ref 73–118)
POTASSIUM: 3.3 meq/L (ref 3.3–4.7)
Sodium: 141 mEq/L (ref 128–145)
Total Protein: 8.5 g/dL — ABNORMAL HIGH (ref 6.4–8.1)

## 2014-05-20 MED ORDER — PANTOPRAZOLE SODIUM 40 MG PO TBEC: 40.0000 mg | DELAYED_RELEASE_TABLET | Freq: Two times a day (BID) | ORAL | Status: DC

## 2014-05-20 NOTE — Progress Notes (Signed)
[place]  [phone] [phone]  ID: Barbara Cook OB: 04/13/1956 MR#: [id number] JYN#:[id number] Patient Care Team: Horatio Pel, MD as PCP - General (Internal Medicine) Hennie Duos, MD as Consulting Physician (Rheumatology)  DIAGNOSIS: IgG kappa myeloma   INTERVAL HISTORY: Ms.Barbara Cook is here today for a follow-up. She has just finished radiation to her T12 lesion. She is having pain after swallowing in her stomach. She has not been eating or drinking very much for the last few days. She does not want IV fluids today. She is using Carafate 4 times daily. This hasn't been much help.  She is requesting to start her treatment next week. She will go ahead and start her Revlimid today. We will also start her on Protonix.  She denies fever, chills, n/v, cough, rash, headache, dizziness, SOB, chest pain, palpitations, constipation, diarrhea, blood in urine or stool.  No swelling, tenderness, numbness or tingling in her extremities.  In December, her M-spike was 3.03, IgG level was 3240 and kappa light chain was 11.   CURRENT TREATMENT: S/p radiation therapy to a T12 lesion Patient to start Velcade/Revlimid post radiation - holding this week per pt request Zometa 4 mg IV every month  REVIEW OF SYSTEMS: All other 10 point review of systems is negative.   PAST MEDICAL HISTORY: Past Medical History  Diagnosis Date  . Arthropathy, unspecified, site unspecified   . GERD (gastroesophageal reflux disease)   . Arthritis   . MGUS (monoclonal gammopathy of unknown significance) 09/26/202013  . Endometriosis   . Fibroid   . Polymyalgia rheumatica   . IBS (irritable bowel syndrome)   . Multiple myeloma 02/18/2014  . Myelitis 2011    of bone    PAST SURGICAL HISTORY: Past Surgical History  Procedure Laterality Date  . Hernia repair    . Tubal ligation    . Neck surgery    . Fistula repair surg      FAMILY HISTORY Family History  Problem  Relation Age of Onset  . Lung cancer Maternal Uncle   . Throat cancer Maternal Aunt   . Diabetes Maternal Grandmother   . Hypertension Maternal Grandmother   . Heart disease Maternal Grandmother   . Heart disease Paternal Grandmother   . Colon cancer Neg Hx   . Heart disease Mother   . Rheum arthritis Mother     GYNECOLOGIC HISTORY:  No LMP recorded. Patient is postmenopausal.   SOCIAL HISTORY: History   Social History  . Marital Status: Married    Spouse Name: Barbara Cook    Number of Children: 1  . Years of Education: 12   Occupational History  . [employer]   Social History Main Topics  . Smoking status: Former Smoker -- 25.00 packs/day for 5 years    Types: Cigarettes    Start date: 05/26/1988    Quit date: 01/11/1994  . Smokeless tobacco: Never Used     Comment: QUIT SMOKING 20 YEARS AGO  . Alcohol Use: No  . Drug Use: No  . Sexual Activity: Yes    Birth Control/ Protection: Surgical     Comment: BTL   Other Topics Concern  . Not on file   Social History Narrative   Patient lives at home with her husband Barbara Cook). Patient works full time.   Education- High school   Right handed.   Caffeine- None    ADVANCED DIRECTIVES:  <no information>  HEALTH MAINTENANCE: History  Substance Use Topics  .  Smoking status: Former Smoker -- 25.00 packs/day for 5 years    Types: Cigarettes    Start date: 05/26/1988    Quit date: 01/11/1994  . Smokeless tobacco: Never Used     Comment: QUIT SMOKING 20 YEARS AGO  . Alcohol Use: No   Colonoscopy: PAP: Bone density: Lipid panel:  Allergies  Allergen Reactions  . Codeine Palpitations    Current Outpatient Prescriptions  Medication Sig Dispense Refill  . acetaminophen (TYLENOL) 650 MG CR tablet Take 1,300 mg by mouth every 8 (eight) hours as needed for pain.     . BucAlfAspKGlucCouchParsUvaUrJu (WATER PILLS PO) Take 1 tablet by mouth 3 (three) times daily.    . Chlorophyll (CHLOROXYGEN PO) Take  36 drops by mouth. Takes 36 drops twice a day    . Cholecalciferol (VITAMIN D) 1000 UNITS capsule Take 2,000 Units by mouth 2 (two) times daily. 2 IN AM AND 2 IN PM    . famciclovir (FAMVIR) 500 MG tablet Take 1 tablet (500 mg total) by mouth daily. Take 1 pill daily. 30 tablet 0  . hyaluronate sodium (RADIAPLEXRX) GEL Apply 1 application topically once.    Marland Kitchen lenalidomide (REVLIMID) 25 MG capsule Take 1 capsule at bedtime daily for 21 days and then off for 7 days. (Patient not taking: Reported on 05/14/2014) 21 capsule 6  . loperamide (IMODIUM) 2 MG capsule Take by mouth as needed for diarrhea or loose stools.    . Magnesium 250 MG TABS Take 1 tablet by mouth 2 (two) times daily. Takes 500 mg daily    . Multiple Vitamin (MULTIVITAMIN) tablet Take 1 tablet by mouth every evening.     . NON FORMULARY 3 capsules 3 (three) times daily. Acid-2-alkaline    . Noni, Morinda citrifolia, (NONI JUICE PO) Take 1 oz by mouth daily.    Marland Kitchen OVER THE COUNTER MEDICATION 3 capsules 2 (two) times daily. curamin    . sucralfate (CARAFATE) 1 GM/10ML suspension Take 10 mLs (1 g total) by mouth 4 (four) times daily -  with meals and at bedtime. 420 mL 0  . torsemide (DEMADEX) 20 MG tablet Take 40 mg by mouth as needed.      No current facility-administered medications for this visit.    OBJECTIVE: Filed Vitals:   05/20/14 0936  BP: 145/73  Pulse: 92  Temp: 97.1 F (36.2 C)  Resp: 18    Filed Weights   05/20/14 0936  Weight: 199 lb (90.266 kg)   ECOG FS:1 - Symptomatic but completely ambulatory Ocular: Sclerae unicteric, pupils equal, round and reactive to light Ear-nose-throat: Oropharynx clear, dentition fair Lymphatic: No cervical or supraclavicular adenopathy Lungs no rales or rhonchi, good excursion bilaterally Heart regular rate and rhythm, no murmur appreciated Abd soft, nontender, positive bowel sounds MSK no focal spinal tenderness, no joint edema Neuro: non-focal, well-oriented, appropriate  affect Breasts: Deferred  LAB RESULTS: CMP     Component Value Date/Time   NA 141 05/20/2014 0855   NA 133* 03/26/2014 1027   K 3.3 05/20/2014 0855   K 4.0 03/26/2014 1027   CL 106 05/20/2014 0855   CL 99 03/26/2014 1027   CO2 27 05/20/2014 0855   CO2 26 03/26/2014 1027   GLUCOSE 101 05/20/2014 0855   GLUCOSE 87 03/26/2014 1027   BUN 8 05/20/2014 0855   BUN 22 03/26/2014 1027   CREATININE 0.7 05/20/2014 0855   CREATININE 0.97 03/26/2014 1027   CALCIUM 9.1 05/20/2014 0855   CALCIUM 9.5 03/26/2014 1027  PROT 8.5* 05/20/2014 0855   PROT 9.0* 03/26/2014 1027   ALBUMIN 3.8 03/26/2014 1027   AST 22 05/20/2014 0855   AST 18 03/26/2014 1027   ALT 22 05/20/2014 0855   ALT 17 03/26/2014 1027   ALKPHOS 43 05/20/2014 0855   ALKPHOS 51 03/26/2014 1027   BILITOT 0.50 05/20/2014 0855   BILITOT 0.4 03/26/2014 1027   INo results found for: SPEP, UPEP Lab Results  Component Value Date   WBC 3.3* 05/20/2014   NEUTROABS 1.9 05/20/2014   HGB 9.7* 05/20/2014   HCT 29.4* 05/20/2014   MCV 89 05/20/2014   PLT 139* 05/20/2014   No results found for: LABCA2 No components found for: [id number] No results for input(s): INR in the last 168 hours.  STUDIES: No results found.  ASSESSMENT/PLAN: Ms. Clemenson is 59 year old African-American female with IgG kappa myeloma. She just finished radiation to her T12 lesion. She has had difficulty with a burning in her stomach when she eats or drinks despite being on Carafate.  We will start her on Protonix today. She does not want IV fluids.  She is also requesting to start her treatment next Monday. She wants to be able to drink and stay hydrated.  She will start taking her Revlimid today. We will see her next week for labs and treatment.  She knows to call here with any questions or concerns and to go to the ED in the event of an emergency. We can certainly see her sooner.    Eliezer Bottom, NP 05/20/2014 10:00 AM

## 2014-05-20 NOTE — Telephone Encounter (Signed)
I spoke w [facility] @ [facility] for chemo approval  P: [phone] F: [phone]

## 2014-05-20 NOTE — Telephone Encounter (Signed)
[place]  Auth: [id number] Valid: 05/20/2014-05/20/2015 Chemo:  [id number] Velcade

## 2014-05-20 NOTE — Progress Notes (Signed)
Received notice from [facility] that the patient's Revlimid was shipped on 05/13/2014.

## 2014-05-24 ENCOUNTER — Encounter: Payer: Self-pay | Admitting: Hematology & Oncology

## 2014-05-27 ENCOUNTER — Ambulatory Visit (HOSPITAL_BASED_OUTPATIENT_CLINIC_OR_DEPARTMENT_OTHER): Payer: 59 | Admitting: Lab

## 2014-05-27 ENCOUNTER — Ambulatory Visit (HOSPITAL_BASED_OUTPATIENT_CLINIC_OR_DEPARTMENT_OTHER): Payer: 59

## 2014-05-27 ENCOUNTER — Other Ambulatory Visit: Payer: Self-pay | Admitting: Oncology

## 2014-05-27 DIAGNOSIS — C9000 Multiple myeloma not having achieved remission: Secondary | ICD-10-CM

## 2014-05-27 DIAGNOSIS — Z5112 Encounter for antineoplastic immunotherapy: Secondary | ICD-10-CM

## 2014-05-27 LAB — CMP (CANCER CENTER ONLY)
ALBUMIN: 3.7 g/dL (ref 3.3–5.5)
ALT: 28 U/L (ref 10–47)
AST: 32 U/L (ref 11–38)
Alkaline Phosphatase: 50 U/L (ref 26–84)
BUN, Bld: 16 mg/dL (ref 7–22)
CALCIUM: 9.7 mg/dL (ref 8.0–10.3)
CO2: 31 mEq/L (ref 18–33)
CREATININE: 1 mg/dL (ref 0.6–1.2)
Chloride: 95 mEq/L — ABNORMAL LOW (ref 98–108)
GLUCOSE: 86 mg/dL (ref 73–118)
POTASSIUM: 3.4 meq/L (ref 3.3–4.7)
SODIUM: 140 meq/L (ref 128–145)
TOTAL PROTEIN: 9.6 g/dL — AB (ref 6.4–8.1)
Total Bilirubin: 0.7 mg/dl (ref 0.20–1.60)

## 2014-05-27 LAB — CBC WITH DIFFERENTIAL (CANCER CENTER ONLY)
BASO#: 0 10e3/uL (ref 0.0–0.2)
BASO%: 0.3 % (ref 0.0–2.0)
EOS%: 3.4 % (ref 0.0–7.0)
Eosinophils Absolute: 0.1 10e3/uL (ref 0.0–0.5)
HCT: 32.8 % — ABNORMAL LOW (ref 34.8–46.6)
HGB: 10.9 g/dL — ABNORMAL LOW (ref 11.6–15.9)
LYMPH#: 1.1 10e3/uL (ref 0.9–3.3)
LYMPH%: 36.2 % (ref 14.0–48.0)
MCH: 29.5 pg (ref 26.0–34.0)
MCHC: 33.2 g/dL (ref 32.0–36.0)
MCV: 89 fL (ref 81–101)
MONO#: 0.3 10e3/uL (ref 0.1–0.9)
MONO%: 8.9 % (ref 0.0–13.0)
NEUT#: 1.5 10e3/uL (ref 1.5–6.5)
NEUT%: 51.2 % (ref 39.6–80.0)
Platelets: 129 10e3/uL — ABNORMAL LOW (ref 145–400)
RBC: 3.7 10e6/uL (ref 3.70–5.32)
RDW: 14.3 % (ref 11.1–15.7)
WBC: 2.9 10e3/uL — ABNORMAL LOW (ref 3.9–10.0)

## 2014-05-27 MED ORDER — ONDANSETRON HCL 8 MG PO TABS
8.0000 mg | ORAL_TABLET | Freq: Once | ORAL | Status: AC
  Administered 2014-05-27: 8 mg via ORAL

## 2014-05-27 MED ORDER — PROCHLORPERAZINE MALEATE 10 MG PO TABS: 10.0000 mg | ORAL_TABLET | Freq: Four times a day (QID) | ORAL | Status: DC | PRN

## 2014-05-27 MED ORDER — ZOLEDRONIC ACID 4 MG/100ML IV SOLN
4.0000 mg | Freq: Once | INTRAVENOUS | Status: AC
  Administered 2014-05-27: 4 mg via INTRAVENOUS
  Filled 2014-05-27: qty 100

## 2014-05-27 MED ORDER — SODIUM CHLORIDE 0.9 % IV SOLN
Freq: Once | INTRAVENOUS | Status: AC
  Administered 2014-05-27: 10:00:00 via INTRAVENOUS

## 2014-05-27 MED ORDER — ONDANSETRON HCL 8 MG PO TABS: ORAL_TABLET | ORAL | Status: DC

## 2014-05-27 MED ORDER — ONDANSETRON HCL 8 MG PO TABS
ORAL_TABLET | ORAL | Status: AC
  Filled 2014-05-27: qty 1

## 2014-05-27 MED ORDER — BORTEZOMIB CHEMO SQ INJECTION 3.5 MG (2.5MG/ML)
1.3000 mg/m2 | Freq: Once | INTRAMUSCULAR | Status: AC
  Administered 2014-05-27: 2.75 mg via SUBCUTANEOUS
  Filled 2014-05-27: qty 2.75

## 2014-05-27 NOTE — Patient Instructions (Signed)
Bortezomib injection What is this medicine? BORTEZOMIB (bor TEZ oh mib) is a chemotherapy drug. It slows the growth of cancer cells. This medicine is used to treat multiple myeloma, and certain lymphomas, such as mantle-cell lymphoma. This medicine may be used for other purposes; ask your health care provider or pharmacist if you have questions. COMMON BRAND NAME(S): Velcade What should I tell my health care provider before I take this medicine? They need to know if you have any of these conditions: -diabetes -heart disease -irregular heartbeat -liver disease -on hemodialysis -low blood counts, like low white blood cells, platelets, or hemoglobin -peripheral neuropathy -taking medicine for blood pressure -an unusual or allergic reaction to bortezomib, mannitol, boron, other medicines, foods, dyes, or preservatives -pregnant or trying to get pregnant -breast-feeding How should I use this medicine? This medicine is for injection into a vein or for injection under the skin. It is given by a health care professional in a hospital or clinic setting. Talk to your pediatrician regarding the use of this medicine in children. Special care may be needed. Overdosage: If you think you have taken too much of this medicine contact a poison control center or emergency room at once. NOTE: This medicine is only for you. Do not share this medicine with others. What if I miss a dose? It is important not to miss your dose. Call your doctor or health care professional if you are unable to keep an appointment. What may interact with this medicine? This medicine may interact with the following medications: -ketoconazole -rifampin -ritonavir -St. John's Wort This list may not describe all possible interactions. Give your health care provider a list of all the medicines, herbs, non-prescription drugs, or dietary supplements you use. Also tell them if you smoke, drink alcohol, or use illegal drugs. Some items  may interact with your medicine. What should I watch for while using this medicine? Visit your doctor for checks on your progress. This drug may make you feel generally unwell. This is not uncommon, as chemotherapy can affect healthy cells as well as cancer cells. Report any side effects. Continue your course of treatment even though you feel ill unless your doctor tells you to stop. You may get drowsy or dizzy. Do not drive, use machinery, or do anything that needs mental alertness until you know how this medicine affects you. Do not stand or sit up quickly, especially if you are an older patient. This reduces the risk of dizzy or fainting spells. In some cases, you may be given additional medicines to help with side effects. Follow all directions for their use. Call your doctor or health care professional for advice if you get a fever, chills or sore throat, or other symptoms of a cold or flu. Do not treat yourself. This drug decreases your body's ability to fight infections. Try to avoid being around people who are sick. This medicine may increase your risk to bruise or bleed. Call your doctor or health care professional if you notice any unusual bleeding. You may need blood work done while you are taking this medicine. In some patients, this medicine may cause a serious brain infection that may cause death. If you have any problems seeing, thinking, speaking, walking, or standing, tell your doctor right away. If you cannot reach your doctor, urgently seek other source of medical care. Do not become pregnant while taking this medicine. Women should inform their doctor if they wish to become pregnant or think they might be pregnant. There is   a potential for serious side effects to an unborn child. Talk to your health care professional or pharmacist for more information. Do not breast-feed an infant while taking this medicine. Check with your doctor or health care professional if you get an attack of  severe diarrhea, nausea and vomiting, or if you sweat a lot. The loss of too much body fluid can make it dangerous for you to take this medicine. What side effects may I notice from receiving this medicine? Side effects that you should report to your doctor or health care professional as soon as possible: -allergic reactions like skin rash, itching or hives, swelling of the face, lips, or tongue -breathing problems -changes in hearing -changes in vision -fast, irregular heartbeat -feeling faint or lightheaded, falls -pain, tingling, numbness in the hands or feet -right upper belly pain -seizures -swelling of the ankles, feet, hands -unusual bleeding or bruising -unusually weak or tired -vomiting -yellowing of the eyes or skin Side effects that usually do not require medical attention (report to your doctor or health care professional if they continue or are bothersome): -changes in emotions or moods -constipation -diarrhea -loss of appetite -headache -irritation at site where injected -nausea This list may not describe all possible side effects. Call your doctor for medical advice about side effects. You may report side effects to FDA at 1-800-FDA-1088. Where should I keep my medicine? This drug is given in a hospital or clinic and will not be stored at home. NOTE: This sheet is a summary. It may not cover all possible information. If you have questions about this medicine, talk to your doctor, pharmacist, or health care provider.  2015, Elsevier/Gold Standard. (2013-02-19 12:46:32) Zoledronic Acid injection (Hypercalcemia, Oncology) What is this medicine? ZOLEDRONIC ACID (ZOE le dron ik AS id) lowers the amount of calcium loss from bone. It is used to treat too much calcium in your blood from cancer. It is also used to prevent complications of cancer that has spread to the bone. This medicine may be used for other purposes; ask your health care provider or pharmacist if you have  questions. COMMON BRAND NAME(S): Zometa What should I tell my health care provider before I take this medicine? They need to know if you have any of these conditions: -aspirin-sensitive asthma -cancer, especially if you are receiving medicines used to treat cancer -dental disease or wear dentures -infection -kidney disease -receiving corticosteroids like dexamethasone or prednisone -an unusual or allergic reaction to zoledronic acid, other medicines, foods, dyes, or preservatives -pregnant or trying to get pregnant -breast-feeding How should I use this medicine? This medicine is for infusion into a vein. It is given by a health care professional in a hospital or clinic setting. Talk to your pediatrician regarding the use of this medicine in children. Special care may be needed. Overdosage: If you think you have taken too much of this medicine contact a poison control center or emergency room at once. NOTE: This medicine is only for you. Do not share this medicine with others. What if I miss a dose? It is important not to miss your dose. Call your doctor or health care professional if you are unable to keep an appointment. What may interact with this medicine? -certain antibiotics given by injection -NSAIDs, medicines for pain and inflammation, like ibuprofen or naproxen -some diuretics like bumetanide, furosemide -teriparatide -thalidomide This list may not describe all possible interactions. Give your health care provider a list of all the medicines, herbs, non-prescription drugs, or dietary   supplements you use. Also tell them if you smoke, drink alcohol, or use illegal drugs. Some items may interact with your medicine. What should I watch for while using this medicine? Visit your doctor or health care professional for regular checkups. It may be some time before you see the benefit from this medicine. Do not stop taking your medicine unless your doctor tells you to. Your doctor may  order blood tests or other tests to see how you are doing. Women should inform their doctor if they wish to become pregnant or think they might be pregnant. There is a potential for serious side effects to an unborn child. Talk to your health care professional or pharmacist for more information. You should make sure that you get enough calcium and vitamin D while you are taking this medicine. Discuss the foods you eat and the vitamins you take with your health care professional. Some people who take this medicine have severe bone, joint, and/or muscle pain. This medicine may also increase your risk for jaw problems or a broken thigh bone. Tell your doctor right away if you have severe pain in your jaw, bones, joints, or muscles. Tell your doctor if you have any pain that does not go away or that gets worse. Tell your dentist and dental surgeon that you are taking this medicine. You should not have major dental surgery while on this medicine. See your dentist to have a dental exam and fix any dental problems before starting this medicine. Take good care of your teeth while on this medicine. Make sure you see your dentist for regular follow-up appointments. What side effects may I notice from receiving this medicine? Side effects that you should report to your doctor or health care professional as soon as possible: -allergic reactions like skin rash, itching or hives, swelling of the face, lips, or tongue -anxiety, confusion, or depression -breathing problems -changes in vision -eye pain -feeling faint or lightheaded, falls -jaw pain, especially after dental work -mouth sores -muscle cramps, stiffness, or weakness -trouble passing urine or change in the amount of urine Side effects that usually do not require medical attention (report to your doctor or health care professional if they continue or are bothersome): -bone, joint, or muscle pain -constipation -diarrhea -fever -hair loss -irritation  at site where injected -loss of appetite -nausea, vomiting -stomach upset -trouble sleeping -trouble swallowing -weak or tired This list may not describe all possible side effects. Call your doctor for medical advice about side effects. You may report side effects to FDA at 1-800-FDA-1088. Where should I keep my medicine? This drug is given in a hospital or clinic and will not be stored at home. NOTE: This sheet is a summary. It may not cover all possible information. If you have questions about this medicine, talk to your doctor, pharmacist, or health care provider.  2015, Elsevier/Gold Standard. (2012-10-05 13:03:13)  

## 2014-05-29 ENCOUNTER — Telehealth: Payer: Self-pay | Admitting: *Deleted

## 2014-05-29 LAB — PROTEIN ELECTROPHORESIS, SERUM, WITH REFLEX
ALBUMIN ELP: 45.7 % — AB (ref 55.8–66.1)
ALPHA-1-GLOBULIN: 3.6 % (ref 2.9–4.9)
Alpha-2-Globulin: 7.5 % (ref 7.1–11.8)
Beta 2: 3.4 % (ref 3.2–6.5)
Beta Globulin: 3.9 % — ABNORMAL LOW (ref 4.7–7.2)
GAMMA GLOBULIN: 35.9 % — AB (ref 11.1–18.8)
M-SPIKE, %: 2.88 g/dL
TOTAL PROTEIN, SERUM ELECTROPHOR: 9.3 g/dL — AB (ref 6.0–8.3)

## 2014-05-29 LAB — IGG, IGA, IGM
IGA: 16 mg/dL — AB (ref 69–380)
IGM, SERUM: 9 mg/dL — AB (ref 52–322)
IgG (Immunoglobin G), Serum: 3140 mg/dL — ABNORMAL HIGH (ref 690–1700)

## 2014-05-29 LAB — IFE INTERPRETATION

## 2014-05-29 LAB — KAPPA/LAMBDA LIGHT CHAINS
KAPPA LAMBDA RATIO: 9.91 — AB (ref 0.26–1.65)
Kappa free light chain: 11.2 mg/dL — ABNORMAL HIGH (ref 0.33–1.94)
Lambda Free Lght Chn: 1.13 mg/dL (ref 0.57–2.63)

## 2014-05-29 NOTE — Telephone Encounter (Signed)
Patient had a Velcade injection on Monday. States that the site is tender and a little red. Told her that this was most likely a localized reaction to the medicine and to apply ice for comfort. Told patient that if it didn't resolve, became warm, started to drain, developed a sore, or she had any other signs of infection, for her to call us back. She understood and was agreeable to plan.

## 2014-05-30 ENCOUNTER — Encounter: Payer: Self-pay | Admitting: Hematology & Oncology

## 2014-05-31 ENCOUNTER — Other Ambulatory Visit: Payer: Self-pay | Admitting: *Deleted

## 2014-05-31 DIAGNOSIS — C9000 Multiple myeloma not having achieved remission: Secondary | ICD-10-CM

## 2014-06-03 ENCOUNTER — Other Ambulatory Visit: Payer: Self-pay

## 2014-06-03 ENCOUNTER — Encounter (HOSPITAL_COMMUNITY): Payer: Self-pay | Admitting: Family Medicine

## 2014-06-03 ENCOUNTER — Emergency Department (HOSPITAL_COMMUNITY): Payer: 59

## 2014-06-03 ENCOUNTER — Encounter: Payer: Self-pay | Admitting: Hematology & Oncology

## 2014-06-03 ENCOUNTER — Ambulatory Visit (HOSPITAL_BASED_OUTPATIENT_CLINIC_OR_DEPARTMENT_OTHER): Payer: 59 | Admitting: Hematology & Oncology

## 2014-06-03 ENCOUNTER — Ambulatory Visit: Payer: 59

## 2014-06-03 ENCOUNTER — Emergency Department (HOSPITAL_COMMUNITY)
Admission: EM | Admit: 2014-06-03 | Discharge: 2014-06-03 | Disposition: A | Discharge status: Home / Self Care | Payer: 59 | Attending: Emergency Medicine | Admitting: Emergency Medicine

## 2014-06-03 ENCOUNTER — Other Ambulatory Visit: Payer: 59 | Admitting: Lab

## 2014-06-03 DIAGNOSIS — Z79899 Other long term (current) drug therapy: Secondary | ICD-10-CM | POA: Insufficient documentation

## 2014-06-03 DIAGNOSIS — Z8739 Personal history of other diseases of the musculoskeletal system and connective tissue: Secondary | ICD-10-CM | POA: Diagnosis not present

## 2014-06-03 DIAGNOSIS — K219 Gastro-esophageal reflux disease without esophagitis: Secondary | ICD-10-CM | POA: Diagnosis not present

## 2014-06-03 DIAGNOSIS — R509 Fever, unspecified: Secondary | ICD-10-CM | POA: Diagnosis present

## 2014-06-03 DIAGNOSIS — Z85820 Personal history of malignant melanoma of skin: Secondary | ICD-10-CM | POA: Insufficient documentation

## 2014-06-03 DIAGNOSIS — Z862 Personal history of diseases of the blood and blood-forming organs and certain disorders involving the immune mechanism: Secondary | ICD-10-CM | POA: Diagnosis not present

## 2014-06-03 DIAGNOSIS — N390 Urinary tract infection, site not specified: Secondary | ICD-10-CM | POA: Insufficient documentation

## 2014-06-03 DIAGNOSIS — C9000 Multiple myeloma not having achieved remission: Secondary | ICD-10-CM

## 2014-06-03 DIAGNOSIS — Z87891 Personal history of nicotine dependence: Secondary | ICD-10-CM | POA: Insufficient documentation

## 2014-06-03 DIAGNOSIS — M353 Polymyalgia rheumatica: Secondary | ICD-10-CM

## 2014-06-03 DIAGNOSIS — Z8742 Personal history of other diseases of the female genital tract: Secondary | ICD-10-CM | POA: Insufficient documentation

## 2014-06-03 LAB — COMPREHENSIVE METABOLIC PANEL
ALBUMIN: 3.6 g/dL (ref 3.5–5.2)
ALT: 43 U/L — ABNORMAL HIGH (ref 0–35)
ANION GAP: 5 (ref 5–15)
AST: 40 U/L — AB (ref 0–37)
Alkaline Phosphatase: 47 U/L (ref 39–117)
BUN: 12 mg/dL (ref 6–23)
CALCIUM: 7.8 mg/dL — AB (ref 8.4–10.5)
CO2: 24 mmol/L (ref 19–32)
Chloride: 104 mmol/L (ref 96–112)
Creatinine, Ser: 0.82 mg/dL (ref 0.50–1.10)
GFR calc Af Amer: 89 mL/min — ABNORMAL LOW (ref >90)
GFR calc non Af Amer: 77 mL/min — ABNORMAL LOW (ref >90)
Glucose, Bld: 98 mg/dL (ref 70–99)
POTASSIUM: 3.9 mmol/L (ref 3.5–5.1)
SODIUM: 133 mmol/L — AB (ref 135–145)
Total Bilirubin: 0.5 mg/dL (ref 0.3–1.2)
Total Protein: 8.6 g/dL — ABNORMAL HIGH (ref 6.0–8.3)

## 2014-06-03 LAB — CBC WITH DIFFERENTIAL/PLATELET
Basophils Absolute: 0 10*3/uL (ref 0.0–0.1)
Basophils Relative: 0 % (ref 0–1)
EOS ABS: 0.1 10*3/uL (ref 0.0–0.7)
Eosinophils Relative: 3 % (ref 0–5)
HCT: 30.2 % — ABNORMAL LOW (ref 36.0–46.0)
Hemoglobin: 10.1 g/dL — ABNORMAL LOW (ref 12.0–15.0)
LYMPHS ABS: 0.9 10*3/uL (ref 0.7–4.0)
Lymphocytes Relative: 21 % (ref 12–46)
MCH: 29.4 pg (ref 26.0–34.0)
MCHC: 33.4 g/dL (ref 30.0–36.0)
MCV: 87.8 fL (ref 78.0–100.0)
Monocytes Absolute: 0.4 10*3/uL (ref 0.1–1.0)
Monocytes Relative: 9 % (ref 3–12)
NEUTROS ABS: 2.7 10*3/uL (ref 1.7–7.7)
NEUTROS PCT: 67 % (ref 43–77)
Platelets: 135 10*3/uL — ABNORMAL LOW (ref 150–400)
RBC: 3.44 MIL/uL — AB (ref 3.87–5.11)
RDW: 15 % (ref 11.5–15.5)
WBC: 4.1 10*3/uL (ref 4.0–10.5)

## 2014-06-03 LAB — URINALYSIS, ROUTINE W REFLEX MICROSCOPIC
BILIRUBIN URINE: NEGATIVE
Glucose, UA: NEGATIVE mg/dL
KETONES UR: NEGATIVE mg/dL
NITRITE: NEGATIVE
Protein, ur: NEGATIVE mg/dL
Specific Gravity, Urine: 1.016 (ref 1.005–1.030)
Urobilinogen, UA: 1 mg/dL (ref 0.0–1.0)
pH: 7.5 (ref 5.0–8.0)

## 2014-06-03 LAB — URINE MICROSCOPIC-ADD ON

## 2014-06-03 LAB — I-STAT CG4 LACTIC ACID, ED: Lactic Acid, Venous: 0.96 mmol/L (ref 0.5–2.0)

## 2014-06-03 MED ORDER — TORSEMIDE 20 MG PO TABS: 20.0000 mg | ORAL_TABLET | Freq: Every day | ORAL | Status: DC | PRN

## 2014-06-03 MED ORDER — DEXAMETHASONE 4 MG PO TABS: ORAL_TABLET | ORAL | Status: DC

## 2014-06-03 MED ORDER — SODIUM CHLORIDE 0.9 % IV BOLUS (SEPSIS)
1000.0000 mL | Freq: Once | INTRAVENOUS | Status: AC
  Administered 2014-06-03: 1000 mL via INTRAVENOUS

## 2014-06-03 MED ORDER — CEPHALEXIN 500 MG PO CAPS: 500.0000 mg | ORAL_CAPSULE | Freq: Two times a day (BID) | ORAL | Status: DC

## 2014-06-03 MED ORDER — IBUPROFEN 800 MG PO TABS
800.0000 mg | ORAL_TABLET | Freq: Once | ORAL | Status: AC
  Administered 2014-06-03: 800 mg via ORAL
  Filled 2014-06-03: qty 1

## 2014-06-03 MED ORDER — CEPHALEXIN 500 MG PO CAPS
500.0000 mg | ORAL_CAPSULE | Freq: Once | ORAL | Status: AC
  Administered 2014-06-03: 500 mg via ORAL
  Filled 2014-06-03: qty 1

## 2014-06-03 MED ORDER — ACETAMINOPHEN 500 MG PO TABS
1000.0000 mg | ORAL_TABLET | Freq: Once | ORAL | Status: AC
  Administered 2014-06-03: 1000 mg via ORAL
  Filled 2014-06-03: qty 2

## 2014-06-03 MED ORDER — TORSEMIDE 20 MG PO TABS: 20.0000 mg | ORAL_TABLET | Freq: Every day | ORAL | Status: AC | PRN

## 2014-06-03 NOTE — ED Notes (Signed)
Patient is from home and reports she developed a fever 101.8 last night around 22:00. Pt has not took any medications for the fever. Pt has bilateral lower extremity swelling in her ankles. She noticed the swelling after her first chemo injection last Monday.

## 2014-06-03 NOTE — Discharge Instructions (Signed)
Fever, Adult A fever is a higher than normal body temperature. In an adult, an oral temperature around 98.6 F (37 C) is considered normal. A temperature of 100.4 F (38 C) or higher is generally considered a fever. Mild or moderate fevers generally have no long-term effects and often do not require treatment. Extreme fever (greater than or equal to 106 F or 41.1 C) can cause seizures. The sweating that may occur with repeated or prolonged fever may cause dehydration. Elderly people can develop confusion during a fever. A measured temperature can vary with:  Age.  Time of day.  Method of measurement (mouth, underarm, rectal, or ear). The fever is confirmed by taking a temperature with a thermometer. Temperatures can be taken different ways. Some methods are accurate and some are not.  An oral temperature is used most commonly. Electronic thermometers are fast and accurate.  An ear temperature will only be accurate if the thermometer is positioned as recommended by the manufacturer.  A rectal temperature is accurate and done for those adults who have a condition where an oral temperature cannot be taken.  An underarm (axillary) temperature is not accurate and not recommended. Fever is a symptom, not a disease.  CAUSES   Infections commonly cause fever.  Some noninfectious causes for fever include:  Some arthritis conditions.  Some thyroid or adrenal gland conditions.  Some immune system conditions.  Some types of cancer.  A medicine reaction.  High doses of certain street drugs such as methamphetamine.  Dehydration.  Exposure to high outside or room temperatures.  Occasionally, the source of a fever cannot be determined. This is sometimes called a "fever of unknown origin" (FUO).  Some situations may lead to a temporary rise in body temperature that may go away on its own. Examples are:  Childbirth.  Surgery.  Intense exercise. HOME CARE INSTRUCTIONS   Take  appropriate medicines for fever. Follow dosing instructions carefully. If you use acetaminophen to reduce the fever, be careful to avoid taking other medicines that also contain acetaminophen. Do not take aspirin for a fever if you are younger than age 67. There is an association with Reye's syndrome. Reye's syndrome is a rare but potentially deadly disease.  If an infection is present and antibiotics have been prescribed, take them as directed. Finish them even if you start to feel better.  Rest as needed.  Maintain an adequate fluid intake. To prevent dehydration during an illness with prolonged or recurrent fever, you may need to drink extra fluid.Drink enough fluids to keep your urine clear or pale yellow.  Sponging or bathing with room temperature water may help reduce body temperature. Do not use ice water or alcohol sponge baths.  Dress comfortably, but do not over-bundle. SEEK MEDICAL CARE IF:   You are unable to keep fluids down.  You develop vomiting or diarrhea.  You are not feeling at least partly better after 3 days.  You develop new symptoms or problems. SEEK IMMEDIATE MEDICAL CARE IF:   You have shortness of breath or trouble breathing.  You develop excessive weakness.  You are dizzy or you faint.  You are extremely thirsty or you are making little or no urine.  You develop new pain that was not there before (such as in the head, neck, chest, back, or abdomen).  You have persistent vomiting and diarrhea for more than 1 to 2 days.  You develop a stiff neck or your eyes become sensitive to light.  You develop a  skin rash. °· You have a fever or persistent symptoms for more than 2 to 3 days. °· You have a fever and your symptoms suddenly get worse. °MAKE SURE YOU:  °· Understand these instructions. °· Will watch your condition. °· Will get help right away if you are not doing well or get worse. °Document Released: 10/20/2000 Document Revised: 09/10/2013 Document  Reviewed: 02/25/2011 °ExitCare® Patient Information ©2015 [employer]. This information is not intended to replace advice given to you by your health care provider. Make sure you discuss any questions you have with your health care provider. ° °Urinary Tract Infection °Urinary tract infections (UTIs) can develop anywhere along your urinary tract. Your urinary tract is your body's drainage system for removing wastes and extra water. Your urinary tract includes two kidneys, two ureters, a bladder, and a urethra. Your kidneys are a pair of bean-shaped organs. Each kidney is about the size of your fist. They are located below your ribs, one on each side of your spine. °CAUSES °Infections are caused by microbes, which are microscopic organisms, including fungi, viruses, and bacteria. These organisms are so small that they can only be seen through a microscope. Bacteria are the microbes that most commonly cause UTIs. °SYMPTOMS  °Symptoms of UTIs may vary by age and gender of the patient and by the location of the infection. Symptoms in young women typically include a frequent and intense urge to urinate and a painful, burning feeling in the bladder or urethra during urination. Older women and men are more likely to be tired, shaky, and weak and have muscle aches and abdominal pain. A fever may mean the infection is in your kidneys. Other symptoms of a kidney infection include pain in your back or sides below the ribs, nausea, and vomiting. °DIAGNOSIS °To diagnose a UTI, your caregiver will ask you about your symptoms. Your caregiver also will ask to provide a urine sample. The urine sample will be tested for bacteria and white blood cells. White blood cells are made by your body to help fight infection. °TREATMENT  °Typically, UTIs can be treated with medication. Because most UTIs are caused by a bacterial infection, they usually can be treated with the use of antibiotics. The choice of antibiotic and length of  treatment depend on your symptoms and the type of bacteria causing your infection. °HOME CARE INSTRUCTIONS °· If you were prescribed antibiotics, take them exactly as your caregiver instructs you. Finish the medication even if you feel better after you have only taken some of the medication. °· Drink enough water and fluids to keep your urine clear or pale yellow. °· Avoid caffeine, tea, and carbonated beverages. They tend to irritate your bladder. °· Empty your bladder often. Avoid holding urine for long periods of time. °· Empty your bladder before and after sexual intercourse. °· After a bowel movement, women should cleanse from front to back. Use each tissue only once. °SEEK MEDICAL CARE IF:  °· You have back pain. °· You develop a fever. °· Your symptoms do not begin to resolve within 3 days. °SEEK IMMEDIATE MEDICAL CARE IF:  °· You have severe back pain or lower abdominal pain. °· You develop chills. °· You have nausea or vomiting. °· You have continued burning or discomfort with urination. °MAKE SURE YOU:  °· Understand these instructions. °· Will watch your condition. °· Will get help right away if you are not doing well or get worse. °Document Released: 02/03/2005 Document Revised: 10/26/2011 Document Reviewed: 06/04/2011 °  ExitCare® Patient Information ©2015 [employer]. This information is not intended to replace advice given to you by your health care provider. Make sure you discuss any questions you have with your health care provider. ° °

## 2014-06-03 NOTE — Progress Notes (Signed)
Hematology and Oncology Follow Up Visit  Barbara Cook [id number] 08-22-1955 59 y.o. 06/03/2014   Principle Diagnosis:  IgG kappa myeloma   Current Therapy:     Velcade/Revlimid post radiation  Zometa 4 mg IV every month     Interim History:  Ms.  Cook is back for a scheduled visit. She went to the emergency room early this morning. She had a temperature of 101.8. She had no cough. She had no shortness of breath. She had no abdominal pain. She has some bilateral ankle swelling which is chronic.  She had a chest x-ray which was negative. She had lab work which was done which looked pretty much unremarkable. She was not neutropenic.  Her last M spike was 2.88 g/L.  She's having a lot of problems with her polymyalgia. I think that given her Decadron weekly, which we can give for the myeloma should help. I think 20 mg a week would help with the polymyalgia.  She did have a possible urine infection. She was put on Keflex. I think this is reasonable.  She had Zometa a week ago. I suppose that the Zometa could cause a delayed febrile reaction.  She's had no nausea or vomiting. Her appetite has been okay.  Medications:  Current outpatient prescriptions:  .  acetaminophen (TYLENOL) 650 MG CR tablet, Take 1,300 mg by mouth every 8 (eight) hours as needed for pain. , Disp: , Rfl:  .  aspirin 325 MG tablet, Take 325 mg by mouth daily., Disp: , Rfl:  .  bortezomib IV (VELCADE) 3.5 MG injection, Inject 2.75 mg into the vein every 7 (seven) days., Disp: , Rfl:  .  BucAlfAspKGlucCouchParsUvaUrJu (WATER PILLS PO), Take 1 tablet by mouth 3 (three) times daily., Disp: , Rfl:  .  cephALEXin (KEFLEX) 500 MG capsule, Take 1 capsule (500 mg total) by mouth 2 (two) times daily., Disp: 14 capsule, Rfl: 0 .  Chlorophyll (CHLOROXYGEN PO), Take 36 drops by mouth. Takes 36 drops twice a day, Disp: , Rfl:  .  Cholecalciferol (VITAMIN D) 1000 UNITS capsule, Take 2,000 Units by mouth 2 (two) times daily.  2 IN AM AND 2 IN PM, Disp: , Rfl:  .  dexamethasone (DECADRON) 4 MG tablet, Take 5 tablets once a week with food., Disp: 80 tablet, Rfl: 4 .  famciclovir (FAMVIR) 500 MG tablet, Take 1 tablet (500 mg total) by mouth daily. Take 1 pill daily. (Patient taking differently: Take 500 mg by mouth daily. ), Disp: 30 tablet, Rfl: 0 .  hyaluronate sodium (RADIAPLEXRX) GEL, Apply 1 application topically once., Disp: , Rfl:  .  lenalidomide (REVLIMID) 25 MG capsule, Take 1 capsule at bedtime daily for 21 days and then off for 7 days. (Patient taking differently: Take 25 mg by mouth daily. Take 1 capsule at bedtime daily for 21 days and then off for 7 days.), Disp: 21 capsule, Rfl: 6 .  loperamide (IMODIUM) 2 MG capsule, Take by mouth as needed for diarrhea or loose stools., Disp: , Rfl:  .  Magnesium 250 MG TABS, Take 2 tablets by mouth daily. Takes 500 mg daily, Disp: , Rfl:  .  METHYLCOBALAMIN PO, Take 1,000 mcg by mouth daily., Disp: , Rfl:  .  Multiple Vitamin (MULTIVITAMIN) tablet, Take 1 tablet by mouth every evening. , Disp: , Rfl:  .  NON FORMULARY, 3 capsules 3 (three) times daily. Acid-2-alkaline, Disp: , Rfl:  .  Noni, Morinda citrifolia, (NONI JUICE PO), Take 1 oz by mouth daily.,  Disp: , Rfl:  .  ondansetron (ZOFRAN) 8 MG tablet, Take 8mg  twice a day. Start the day after chemo for 2 days. Then take for nausea and vomiting twice a day as needed., Disp: 30 tablet, Rfl: 3 .  OVER THE COUNTER MEDICATION, 3 capsules 2 (two) times daily. curamin, Disp: , Rfl:  .  pantoprazole (PROTONIX) 40 MG tablet, Take 1 tablet (40 mg total) by mouth 2 (two) times daily., Disp: 60 tablet, Rfl: 6 .  prochlorperazine (COMPAZINE) 10 MG tablet, Take 1 tablet (10 mg total) by mouth every 6 (six) hours as needed for nausea or vomiting., Disp: 30 tablet, Rfl: 0 .  sucralfate (CARAFATE) 1 GM/10ML suspension, Take 10 mLs (1 g total) by mouth 4 (four) times daily -  with meals and at bedtime. (Patient not taking: Reported on  06/03/2014), Disp: 420 mL, Rfl: 0 .  torsemide (DEMADEX) 20 MG tablet, Take 1 tablet (20 mg total) by mouth daily as needed., Disp: 30 tablet, Rfl: 6  Allergies:  Allergies  Allergen Reactions  . Codeine Palpitations    Past Medical History, Surgical history, Social history, and Family History were reviewed and updated.  Review of Systems: As above  Physical Exam:  vitals were not taken for this visit.  Well-developed and well-nourished African American female. Head and neck exam shows no ocular or oral lesions. There are no palpable cervical or supraclavicular lymph nodes. Lungs are clear. Cardiac exam regular rate and rhythm with no murmurs, rubs or bruits. Abdomen is soft. She is mildly obese. She is no palpable liver or spleen tip. Back exam shows no tenderness over the spine, ribs or hips. Extremity shows no clubbing, cyanosis or edema. Skin exam shows no rashes, ecchymoses or petechia. Neurological exam is nonfocal.  Lab Results  Component Value Date   WBC 4.1 06/03/2014   HGB 10.1* 06/03/2014   HCT 30.2* 06/03/2014   MCV 87.8 06/03/2014   PLT 135* 06/03/2014     Chemistry      Component Value Date/Time   NA 133* 06/03/2014 0122   NA 140 05/27/2014 0855   K 3.9 06/03/2014 0122   K 3.4 05/27/2014 0855   CL 104 06/03/2014 0122   CL 95* 05/27/2014 0855   CO2 24 06/03/2014 0122   CO2 31 05/27/2014 0855   BUN 12 06/03/2014 0122   BUN 16 05/27/2014 0855   CREATININE 0.82 06/03/2014 0122   CREATININE 1.0 05/27/2014 0855      Component Value Date/Time   CALCIUM 7.8* 06/03/2014 0122   CALCIUM 9.7 05/27/2014 0855   ALKPHOS 47 06/03/2014 0122   ALKPHOS 50 05/27/2014 0855   AST 40* 06/03/2014 0122   AST 32 05/27/2014 0855   ALT 43* 06/03/2014 0122   ALT 28 05/27/2014 0855   BILITOT 0.5 06/03/2014 0122   BILITOT 0.70 05/27/2014 0855         Impression and Plan: Barbara Cook is 59 year old African-American female with IgG kappa myeloma.   I am not sure what the  fever is from. She was afebrile today. I cannot find anything on physical exam that was troublesome.  We will hold off on her treatment today.  Again, we'll see what the Decadron does for her polymyalgia.  We will just move all of her treatments back by one week.  I told her to let us know if she has anymore problems.  I spent about 30-35 minutes with she and her husband.  [name], MD 1/25/20164:43 PM

## 2014-06-03 NOTE — ED Notes (Signed)
Transported to xray 

## 2014-06-03 NOTE — ED Provider Notes (Signed)
TIME SEEN: 1:00 AM  CHIEF COMPLAINT: Fever  HPI: Patient is a 59 y.o. F with history of multiple myeloma, polymyalgia rheumatica who presents to the emergency department with complaints of fever of 101.8 that started tonight. She denies any other symptoms associated with her fever. She is on Revlimid oral tablets daily and is just recently started Velcade injections 6 days ago (Monday).  States that she did have some surrounding erythema around the injection site the day after the injection. She is also noticed some swelling in bilateral ankle since this injection. Denies any headache, neck pain or neck stiffness, sore throat, ear pain, cough, vomiting or diarrhea, abdominal pain, rash. Husband reports he recently had viral symptoms. No other sick contacts or recent travel. States she is feeling well other than having a fever.  ROS: See HPI Constitutional:  fever  Eyes: no drainage  ENT: no runny nose   Cardiovascular:  no chest pain  Resp: no SOB  GI: no vomiting GU: no dysuria Integumentary: no rash  Allergy: no hives  Musculoskeletal: no leg swelling  Neurological: no slurred speech ROS otherwise negative  PAST MEDICAL HISTORY/PAST SURGICAL HISTORY:  Past Medical History  Diagnosis Date  . Arthropathy, unspecified, site unspecified   . GERD (gastroesophageal reflux disease)   . Arthritis   . MGUS (monoclonal gammopathy of unknown significance) 28-Mar-202013  . Endometriosis   . Fibroid   . Polymyalgia rheumatica   . IBS (irritable bowel syndrome)   . Multiple myeloma 02/18/2014  . Myelitis 2011    of bone    MEDICATIONS:  Prior to Admission medications   Medication Sig Start Date End Date Taking? Authorizing Provider  acetaminophen (TYLENOL) 650 MG CR tablet Take 1,300 mg by mouth every 8 (eight) hours as needed for pain.     Historical Provider, MD  BucAlfAspKGlucCouchParsUvaUrJu (WATER PILLS PO) Take 1 tablet by mouth 3 (three) times daily.    Historical Provider, MD   Chlorophyll (CHLOROXYGEN PO) Take 36 drops by mouth. Takes 36 drops twice a day    Historical Provider, MD  Cholecalciferol (VITAMIN D) 1000 UNITS capsule Take 2,000 Units by mouth 2 (two) times daily. 2 IN AM AND 2 IN PM    Historical Provider, MD  famciclovir (FAMVIR) 500 MG tablet Take 1 tablet (500 mg total) by mouth daily. Take 1 pill daily. 04/26/14   [name], MD  hyaluronate sodium (RADIAPLEXRX) GEL Apply 1 application topically once.    Historical Provider, MD  lenalidomide (REVLIMID) 25 MG capsule Take 1 capsule at bedtime daily for 21 days and then off for 7 days. Patient not taking: Reported on 05/14/2014 04/26/14   [name], MD  loperamide (IMODIUM) 2 MG capsule Take by mouth as needed for diarrhea or loose stools.    Historical Provider, MD  Magnesium 250 MG TABS Take 1 tablet by mouth 2 (two) times daily. Takes 500 mg daily    Historical Provider, MD  Multiple Vitamin (MULTIVITAMIN) tablet Take 1 tablet by mouth every evening.     Historical Provider, MD  NON FORMULARY 3 capsules 3 (three) times daily. Acid-2-alkaline    Historical Provider, MD  Noni, Morinda citrifolia, (NONI JUICE PO) Take 1 oz by mouth daily.    Historical Provider, MD  ondansetron (ZOFRAN) 8 MG tablet Take 38m twice a day. Start the day after chemo for 2 days. Then take for nausea and vomiting twice a day as needed. 05/27/14   PVolanda Napoleon MD  OVER THE  COUNTER MEDICATION 3 capsules 2 (two) times daily. curamin    Historical Provider, MD  pantoprazole (PROTONIX) 40 MG tablet Take 1 tablet (40 mg total) by mouth 2 (two) times daily. 05/20/14   Eliezer Bottom, NP  prochlorperazine (COMPAZINE) 10 MG tablet Take 1 tablet (10 mg total) by mouth every 6 (six) hours as needed for nausea or vomiting. 05/27/14   [name], MD  sucralfate (CARAFATE) 1 GM/10ML suspension Take 10 mLs (1 g total) by mouth 4 (four) times daily -  with meals and at bedtime. 05/15/14   Blair Promise, MD  torsemide (DEMADEX)  20 MG tablet Take 40 mg by mouth as needed.     Historical Provider, MD    ALLERGIES:  Allergies  Allergen Reactions  . Codeine Palpitations    SOCIAL HISTORY:  History  Substance Use Topics  . Smoking status: Former Smoker -- 25.00 packs/day for 5 years    Types: Cigarettes    Start date: 05/26/1988    Quit date: 01/11/1994  . Smokeless tobacco: Never Used     Comment: QUIT SMOKING 20 YEARS AGO  . Alcohol Use: No    FAMILY HISTORY: Family History  Problem Relation Age of Onset  . Lung cancer Maternal Uncle   . Throat cancer Maternal Aunt   . Diabetes Maternal Grandmother   . Hypertension Maternal Grandmother   . Heart disease Maternal Grandmother   . Heart disease Paternal Grandmother   . Colon cancer Neg Hx   . Heart disease Mother   . Rheum arthritis Mother     EXAM: BP 117/55 mmHg  Pulse 95  Temp(Src) 99.4 F (37.4 C) (Oral)  Resp 20  Ht 5' 4" (1.626 m)  Wt 195 lb (88.451 kg)  BMI 33.46 kg/m2  SpO2 95% CONSTITUTIONAL: Alert and oriented and responds appropriately to questions. Well-appearing; well-nourished HEAD: Normocephalic EYES: Conjunctivae clear, PERRL ENT: normal nose; no rhinorrhea; moist mucous membranes; pharynx without lesions noted, no trismus or drooling, no uvular deviation, no tonsillar hypertrophy or exudate NECK: Supple, no meningismus, no LAD  CARD: RRR; S1 and S2 appreciated; no murmurs, no clicks, no rubs, no gallops RESP: Normal chest excursion without splinting or tachypnea; breath sounds clear and equal bilaterally; no wheezes, no rhonchi, no rales, no hypoxia or respiratory distress ABD/GI: Normal bowel sounds; non-distended; soft, non-tender, no rebound, no guarding BACK:  The back appears normal and is non-tender to palpation, there is no CVA tenderness EXT: Normal ROM in all joints; non-tender to palpation; no edema; normal capillary refill; no cyanosis    SKIN: Normal color for age and race; warm, small local site of erythema in  the right lower abdomen from recent injection with no warmth, fluctuance, induration NEURO: Moves all extremities equally, sensation to light touch intact diffusely, cranial nerves II through XII intact PSYCH: The patient's mood and manner are appropriate. Grooming and personal hygiene are appropriate.  MEDICAL DECISION MAKING: Patient here with fever. She is on chemotherapy last was 6 days ago. No other associated symptoms. She is very well-appearing on exam, nontoxic, well-hydrated. We'll obtain labs, cultures, lactate, chest x-ray, urinalysis. She is hemodynamically stable. We'll give IV fluids, Tylenol and reassess.  ED PROGRESS: Patient is not neutropenic. Her labs are unremarkable other than very mild elevation of her AST and ALT but she has no abdominal tenderness on exam. Lactate normal. Urinalysis to show trace hemoglobin small leukocytes but rare bacteria. Culture is pending. Denies any urinary symptoms but given she is  a cancer patient receiving chemotherapy and will treat her with Keflex. Have offered admission but patient reports she is feeling well and would like to be discharged home. She is hemodynamically stable. Blood pressures are slightly low in the upper 90s but on review of her record she has had blood pressures in the upper 86V systolically in the past. She has an appoint with her oncologist Dr. Marin Olp today at 9 AM. She states she will follow-up with Dr. Marin Olp. Discussed strict return precautions. She verbalized understanding and is comfortable with plan.      EKG Interpretation  Date/Time:  Monday June 03 2014 03:06:49 EST Ventricular Rate:  89 PR Interval:  148 QRS Duration: 70 QT Interval:  380 QTC Calculation: 462 R Axis:   -7 Text Interpretation:  Sinus rhythm Borderline low voltage, extremity leads No significant change since 2005 Confirmed by WARD,  DO, KRISTEN [phone]) on 06/03/2014 3:26:45 AM        Delice Bison Ward, DO 06/03/14 0430

## 2014-06-03 NOTE — ED Notes (Signed)
Pt returned from radiology.

## 2014-06-03 NOTE — ED Notes (Signed)
Pt is aware that a urine sample is need. Pt will call out when she needs to urinate.

## 2014-06-04 ENCOUNTER — Encounter: Payer: Self-pay | Admitting: Radiation Oncology

## 2014-06-04 ENCOUNTER — Telehealth: Payer: Self-pay | Admitting: Hematology & Oncology

## 2014-06-04 ENCOUNTER — Other Ambulatory Visit: Payer: Self-pay | Admitting: *Deleted

## 2014-06-04 DIAGNOSIS — C9000 Multiple myeloma not having achieved remission: Secondary | ICD-10-CM

## 2014-06-04 LAB — URINE CULTURE: Colony Count: 45000

## 2014-06-04 MED ORDER — FLUCONAZOLE 150 MG PO TABS: 150.0000 mg | ORAL_TABLET | Freq: Every day | ORAL | Status: DC

## 2014-06-04 NOTE — Progress Notes (Signed)
  Radiation Oncology         [phone] ________________________________  Name: NAN MAYA MRN: [id number]  Date: 04/25/2014  DOB: September 01, 1955  SIMULATION AND TREATMENT PLANNING NOTE    ICD-9-CM ICD-10-CM   1. Multiple myeloma 203.00 C90.00     DIAGNOSIS: Multiple myeloma  NARRATIVE:  The patient was brought to the [place] suite.  Identity was confirmed.  All relevant records and images related to the planned course of therapy were reviewed.  The patient freely provided informed written consent to proceed with treatment after reviewing the details related to the planned course of therapy. The consent form was witnessed and verified by the simulation staff.  Then, the patient was set-up in a stable reproducible  supine position for radiation therapy.  CT images were obtained.  Surface markings were placed.  The CT images were loaded into the planning software.  Then the target and avoidance structures were contoured.  Treatment planning then occurred.  The radiation prescription was entered and confirmed.  Then, I designed and supervised the construction of a total of 3 medically necessary complex treatment devices.  I have requested : 3D Simulation  I have requested a DVH of the following structures: GTV.PTV, spinal cord, kidneys.  I have ordered:dose calc.  PLAN:  The patient will receive 25 Gy in 10 fractions.  ________________________________  -----------------------------------  Blair Promise, PhD, MD

## 2014-06-04 NOTE — Telephone Encounter (Signed)
Pt aware of feb appointment changes

## 2014-06-04 NOTE — Progress Notes (Signed)
  Radiation Oncology         [phone] ________________________________  Name: Barbara Cook MRN: [id number]  Date: 06/04/2014  DOB: May 24, 1955  End of Treatment Note  Diagnosis:   Multiple myeloma     Indication for treatment:  Back pain       Radiation treatment dates:   December 22 through January 7  Site/dose:   T9-L1 spine, 25 gray in 10 fractions  Beams/energy:   3-D conformal using an AP PA beam arrangement, 15x photons  Narrative: The patient tolerated radiation treatment relatively well.   She had minimal esophageal/nausea issues during the course for treatment.  The patient's back pain improved significantly during the course of treatment  Plan: The patient has completed radiation treatment. The patient will return to radiation oncology clinic for routine followup in one month. I advised them to call or return sooner if they have any questions or concerns related to their recovery or treatment.  -----------------------------------  Blair Promise, PhD, MD

## 2014-06-04 NOTE — Addendum Note (Signed)
Encounter addended by: Blair Promise, MD on: 06/04/2014  8:35 AM<BR>     Documentation filed: Notes Section, Visit Diagnoses

## 2014-06-06 ENCOUNTER — Other Ambulatory Visit: Payer: Self-pay | Admitting: *Deleted

## 2014-06-06 DIAGNOSIS — C9000 Multiple myeloma not having achieved remission: Secondary | ICD-10-CM

## 2014-06-06 MED ORDER — LENALIDOMIDE 25 MG PO CAPS: ORAL_CAPSULE | ORAL | Status: DC

## 2014-06-07 ENCOUNTER — Telehealth: Payer: Self-pay | Admitting: *Deleted

## 2014-06-07 ENCOUNTER — Telehealth: Payer: Self-pay | Admitting: Hematology & Oncology

## 2014-06-07 NOTE — Telephone Encounter (Signed)
Received message that patient had left message on main line about having a fever last pm. Spoke to patient and she stated her temp max was 100.2. This morning her temp is normal. She had no other symptoms. She is still taking Keflex for her bladder infection diagnosed earlier this week. She also noted to have soaking night sweats on Tuesday and Wednesday night. Relayed all info to Dr Marin Olp. Told patient to call the office or on-call doc if she was to run a temp of 100.5 or above.

## 2014-06-07 NOTE — Telephone Encounter (Signed)
Pt left 2 messages on my phone last night saying pt had a fever. I called them back this morning and he said they called the other number and her fever had dropped. Roselyn Reef RN aware pt needs follow up.

## 2014-06-09 LAB — CULTURE, BLOOD (ROUTINE X 2)
Culture: NO GROWTH
Culture: NO GROWTH

## 2014-06-10 ENCOUNTER — Ambulatory Visit (HOSPITAL_BASED_OUTPATIENT_CLINIC_OR_DEPARTMENT_OTHER): Payer: 59 | Admitting: Lab

## 2014-06-10 ENCOUNTER — Ambulatory Visit (HOSPITAL_BASED_OUTPATIENT_CLINIC_OR_DEPARTMENT_OTHER): Payer: 59

## 2014-06-10 DIAGNOSIS — C9000 Multiple myeloma not having achieved remission: Secondary | ICD-10-CM

## 2014-06-10 DIAGNOSIS — Z5112 Encounter for antineoplastic immunotherapy: Secondary | ICD-10-CM

## 2014-06-10 LAB — CBC WITH DIFFERENTIAL (CANCER CENTER ONLY)
BASO#: 0 10*3/uL (ref 0.0–0.2)
BASO%: 0.4 % (ref 0.0–2.0)
EOS ABS: 0.3 10*3/uL (ref 0.0–0.5)
EOS%: 10.7 % — ABNORMAL HIGH (ref 0.0–7.0)
HEMATOCRIT: 28.7 % — AB (ref 34.8–46.6)
HGB: 9.5 g/dL — ABNORMAL LOW (ref 11.6–15.9)
LYMPH#: 0.9 10*3/uL (ref 0.9–3.3)
LYMPH%: 31.7 % (ref 14.0–48.0)
MCH: 29.3 pg (ref 26.0–34.0)
MCHC: 33.1 g/dL (ref 32.0–36.0)
MCV: 89 fL (ref 81–101)
MONO#: 0.3 10*3/uL (ref 0.1–0.9)
MONO%: 10.3 % (ref 0.0–13.0)
NEUT#: 1.3 10*3/uL — ABNORMAL LOW (ref 1.5–6.5)
NEUT%: 46.9 % (ref 39.6–80.0)
PLATELETS: 196 10*3/uL (ref 145–400)
RBC: 3.24 10*6/uL — ABNORMAL LOW (ref 3.70–5.32)
RDW: 14.7 % (ref 11.1–15.7)
WBC: 2.7 10*3/uL — ABNORMAL LOW (ref 3.9–10.0)

## 2014-06-10 LAB — CMP (CANCER CENTER ONLY)
ALK PHOS: 34 U/L (ref 26–84)
ALT: 39 U/L (ref 10–47)
AST: 37 U/L (ref 11–38)
Albumin: 3.2 g/dL — ABNORMAL LOW (ref 3.3–5.5)
BUN: 10 mg/dL (ref 7–22)
CALCIUM: 8.6 mg/dL (ref 8.0–10.3)
CO2: 29 mEq/L (ref 18–33)
CREATININE: 0.7 mg/dL (ref 0.6–1.2)
Chloride: 98 mEq/L (ref 98–108)
Glucose, Bld: 86 mg/dL (ref 73–118)
Potassium: 3.4 mEq/L (ref 3.3–4.7)
Sodium: 139 mEq/L (ref 128–145)
Total Bilirubin: 0.7 mg/dl (ref 0.20–1.60)
Total Protein: 8.4 g/dL — ABNORMAL HIGH (ref 6.4–8.1)

## 2014-06-10 MED ORDER — BORTEZOMIB CHEMO SQ INJECTION 3.5 MG (2.5MG/ML)
1.3000 mg/m2 | Freq: Once | INTRAMUSCULAR | Status: AC
  Administered 2014-06-10: 2.75 mg via SUBCUTANEOUS
  Filled 2014-06-10: qty 2.75

## 2014-06-10 MED ORDER — ONDANSETRON HCL 8 MG PO TABS
8.0000 mg | ORAL_TABLET | Freq: Once | ORAL | Status: AC
  Administered 2014-06-10: 8 mg via ORAL

## 2014-06-10 MED ORDER — ONDANSETRON HCL 8 MG PO TABS
ORAL_TABLET | ORAL | Status: AC
  Filled 2014-06-10: qty 1

## 2014-06-10 NOTE — Patient Instructions (Signed)
Bortezomib injection What is this medicine? BORTEZOMIB (bor TEZ oh mib) is a chemotherapy drug. It slows the growth of cancer cells. This medicine is used to treat multiple myeloma, and certain lymphomas, such as mantle-cell lymphoma. This medicine may be used for other purposes; ask your health care provider or pharmacist if you have questions. COMMON BRAND NAME(S): Velcade What should I tell my health care provider before I take this medicine? They need to know if you have any of these conditions: -diabetes -heart disease -irregular heartbeat -liver disease -on hemodialysis -low blood counts, like low white blood cells, platelets, or hemoglobin -peripheral neuropathy -taking medicine for blood pressure -an unusual or allergic reaction to bortezomib, mannitol, boron, other medicines, foods, dyes, or preservatives -pregnant or trying to get pregnant -breast-feeding How should I use this medicine? This medicine is for injection into a vein or for injection under the skin. It is given by a health care professional in a hospital or clinic setting. Talk to your pediatrician regarding the use of this medicine in children. Special care may be needed. Overdosage: If you think you have taken too much of this medicine contact a poison control center or emergency room at once. NOTE: This medicine is only for you. Do not share this medicine with others. What if I miss a dose? It is important not to miss your dose. Call your doctor or health care professional if you are unable to keep an appointment. What may interact with this medicine? This medicine may interact with the following medications: -ketoconazole -rifampin -ritonavir -St. John's Wort This list may not describe all possible interactions. Give your health care provider a list of all the medicines, herbs, non-prescription drugs, or dietary supplements you use. Also tell them if you smoke, drink alcohol, or use illegal drugs. Some items  may interact with your medicine. What should I watch for while using this medicine? Visit your doctor for checks on your progress. This drug may make you feel generally unwell. This is not uncommon, as chemotherapy can affect healthy cells as well as cancer cells. Report any side effects. Continue your course of treatment even though you feel ill unless your doctor tells you to stop. You may get drowsy or dizzy. Do not drive, use machinery, or do anything that needs mental alertness until you know how this medicine affects you. Do not stand or sit up quickly, especially if you are an older patient. This reduces the risk of dizzy or fainting spells. In some cases, you may be given additional medicines to help with side effects. Follow all directions for their use. Call your doctor or health care professional for advice if you get a fever, chills or sore throat, or other symptoms of a cold or flu. Do not treat yourself. This drug decreases your body's ability to fight infections. Try to avoid being around people who are sick. This medicine may increase your risk to bruise or bleed. Call your doctor or health care professional if you notice any unusual bleeding. You may need blood work done while you are taking this medicine. In some patients, this medicine may cause a serious brain infection that may cause death. If you have any problems seeing, thinking, speaking, walking, or standing, tell your doctor right away. If you cannot reach your doctor, urgently seek other source of medical care. Do not become pregnant while taking this medicine. Women should inform their doctor if they wish to become pregnant or think they might be pregnant. There is   a potential for serious side effects to an unborn child. Talk to your health care professional or pharmacist for more information. Do not breast-feed an infant while taking this medicine. Check with your doctor or health care professional if you get an attack of  severe diarrhea, nausea and vomiting, or if you sweat a lot. The loss of too much body fluid can make it dangerous for you to take this medicine. What side effects may I notice from receiving this medicine? Side effects that you should report to your doctor or health care professional as soon as possible: -allergic reactions like skin rash, itching or hives, swelling of the face, lips, or tongue -breathing problems -changes in hearing -changes in vision -fast, irregular heartbeat -feeling faint or lightheaded, falls -pain, tingling, numbness in the hands or feet -right upper belly pain -seizures -swelling of the ankles, feet, hands -unusual bleeding or bruising -unusually weak or tired -vomiting -yellowing of the eyes or skin Side effects that usually do not require medical attention (report to your doctor or health care professional if they continue or are bothersome): -changes in emotions or moods -constipation -diarrhea -loss of appetite -headache -irritation at site where injected -nausea This list may not describe all possible side effects. Call your doctor for medical advice about side effects. You may report side effects to FDA at 1-800-FDA-1088. Where should I keep my medicine? This drug is given in a hospital or clinic and will not be stored at home. NOTE: This sheet is a summary. It may not cover all possible information. If you have questions about this medicine, talk to your doctor, pharmacist, or health care provider.  2015, Elsevier/Gold Standard. (2013-02-19 12:46:32) Zoledronic Acid injection (Hypercalcemia, Oncology) What is this medicine? ZOLEDRONIC ACID (ZOE le dron ik AS id) lowers the amount of calcium loss from bone. It is used to treat too much calcium in your blood from cancer. It is also used to prevent complications of cancer that has spread to the bone. This medicine may be used for other purposes; ask your health care provider or pharmacist if you have  questions. COMMON BRAND NAME(S): Zometa What should I tell my health care provider before I take this medicine? They need to know if you have any of these conditions: -aspirin-sensitive asthma -cancer, especially if you are receiving medicines used to treat cancer -dental disease or wear dentures -infection -kidney disease -receiving corticosteroids like dexamethasone or prednisone -an unusual or allergic reaction to zoledronic acid, other medicines, foods, dyes, or preservatives -pregnant or trying to get pregnant -breast-feeding How should I use this medicine? This medicine is for infusion into a vein. It is given by a health care professional in a hospital or clinic setting. Talk to your pediatrician regarding the use of this medicine in children. Special care may be needed. Overdosage: If you think you have taken too much of this medicine contact a poison control center or emergency room at once. NOTE: This medicine is only for you. Do not share this medicine with others. What if I miss a dose? It is important not to miss your dose. Call your doctor or health care professional if you are unable to keep an appointment. What may interact with this medicine? -certain antibiotics given by injection -NSAIDs, medicines for pain and inflammation, like ibuprofen or naproxen -some diuretics like bumetanide, furosemide -teriparatide -thalidomide This list may not describe all possible interactions. Give your health care provider a list of all the medicines, herbs, non-prescription drugs, or dietary   supplements you use. Also tell them if you smoke, drink alcohol, or use illegal drugs. Some items may interact with your medicine. What should I watch for while using this medicine? Visit your doctor or health care professional for regular checkups. It may be some time before you see the benefit from this medicine. Do not stop taking your medicine unless your doctor tells you to. Your doctor may  order blood tests or other tests to see how you are doing. Women should inform their doctor if they wish to become pregnant or think they might be pregnant. There is a potential for serious side effects to an unborn child. Talk to your health care professional or pharmacist for more information. You should make sure that you get enough calcium and vitamin D while you are taking this medicine. Discuss the foods you eat and the vitamins you take with your health care professional. Some people who take this medicine have severe bone, joint, and/or muscle pain. This medicine may also increase your risk for jaw problems or a broken thigh bone. Tell your doctor right away if you have severe pain in your jaw, bones, joints, or muscles. Tell your doctor if you have any pain that does not go away or that gets worse. Tell your dentist and dental surgeon that you are taking this medicine. You should not have major dental surgery while on this medicine. See your dentist to have a dental exam and fix any dental problems before starting this medicine. Take good care of your teeth while on this medicine. Make sure you see your dentist for regular follow-up appointments. What side effects may I notice from receiving this medicine? Side effects that you should report to your doctor or health care professional as soon as possible: -allergic reactions like skin rash, itching or hives, swelling of the face, lips, or tongue -anxiety, confusion, or depression -breathing problems -changes in vision -eye pain -feeling faint or lightheaded, falls -jaw pain, especially after dental work -mouth sores -muscle cramps, stiffness, or weakness -trouble passing urine or change in the amount of urine Side effects that usually do not require medical attention (report to your doctor or health care professional if they continue or are bothersome): -bone, joint, or muscle pain -constipation -diarrhea -fever -hair loss -irritation  at site where injected -loss of appetite -nausea, vomiting -stomach upset -trouble sleeping -trouble swallowing -weak or tired This list may not describe all possible side effects. Call your doctor for medical advice about side effects. You may report side effects to FDA at 1-800-FDA-1088. Where should I keep my medicine? This drug is given in a hospital or clinic and will not be stored at home. NOTE: This sheet is a summary. It may not cover all possible information. If you have questions about this medicine, talk to your doctor, pharmacist, or health care provider.  2015, Elsevier/Gold Standard. (2012-10-05 13:03:13)  

## 2014-06-12 ENCOUNTER — Other Ambulatory Visit: Payer: Self-pay | Admitting: *Deleted

## 2014-06-12 ENCOUNTER — Telehealth: Payer: Self-pay | Admitting: *Deleted

## 2014-06-12 LAB — KAPPA/LAMBDA LIGHT CHAINS
KAPPA LAMBDA RATIO: 4.13 — AB (ref 0.26–1.65)
Kappa free light chain: 4.87 mg/dL — ABNORMAL HIGH (ref 0.33–1.94)
Lambda Free Lght Chn: 1.18 mg/dL (ref 0.57–2.63)

## 2014-06-12 LAB — IGG, IGA, IGM
IGM, SERUM: 16 mg/dL — AB (ref 52–322)
IgA: 37 mg/dL — ABNORMAL LOW (ref 69–380)
IgG (Immunoglobin G), Serum: 3150 mg/dL — ABNORMAL HIGH (ref 690–1700)

## 2014-06-12 LAB — PROTEIN ELECTROPHORESIS, SERUM, WITH REFLEX
Albumin ELP: 44.1 % — ABNORMAL LOW (ref 55.8–66.1)
Alpha-1-Globulin: 5.1 % — ABNORMAL HIGH (ref 2.9–4.9)
Alpha-2-Globulin: 10.3 % (ref 7.1–11.8)
Beta 2: 4 % (ref 3.2–6.5)
Beta Globulin: 4 % — ABNORMAL LOW (ref 4.7–7.2)
Gamma Globulin: 32.5 % — ABNORMAL HIGH (ref 11.1–18.8)
M-SPIKE, %: 2.23 g/dL
Total Protein, Serum Electrophoresis: 8.2 g/dL (ref 6.0–8.3)

## 2014-06-12 LAB — IFE INTERPRETATION

## 2014-06-12 NOTE — Telephone Encounter (Signed)
Patient stating they are having trouble with constipation. Already on Senna daily. Told to take Miralx BID until resolution of symptoms. Patient asked about enemas. She was told not to use enemas while on treatment. She understood and was in agreement with plan.

## 2014-06-13 ENCOUNTER — Telehealth: Payer: Self-pay | Admitting: *Deleted

## 2014-06-13 NOTE — Telephone Encounter (Addendum)
Patient happy with results. Also followed up regarding the constipation. This is better.   ----- Message from [name], MD sent at 06/12/2014  6:36 PM EST ----- Call - myeloma is getting better.  Myeloma protein level is still going down!! Barbara Cook

## 2014-06-14 ENCOUNTER — Other Ambulatory Visit: Payer: Self-pay | Admitting: *Deleted

## 2014-06-14 DIAGNOSIS — C9000 Multiple myeloma not having achieved remission: Secondary | ICD-10-CM

## 2014-06-17 ENCOUNTER — Ambulatory Visit: Payer: 59 | Admitting: Hematology & Oncology

## 2014-06-17 ENCOUNTER — Other Ambulatory Visit: Payer: Self-pay

## 2014-06-17 ENCOUNTER — Other Ambulatory Visit (HOSPITAL_BASED_OUTPATIENT_CLINIC_OR_DEPARTMENT_OTHER): Payer: 59 | Admitting: Lab

## 2014-06-17 ENCOUNTER — Ambulatory Visit: Payer: 59

## 2014-06-17 ENCOUNTER — Other Ambulatory Visit: Payer: 59 | Admitting: Lab

## 2014-06-17 ENCOUNTER — Ambulatory Visit (HOSPITAL_BASED_OUTPATIENT_CLINIC_OR_DEPARTMENT_OTHER): Payer: 59

## 2014-06-17 DIAGNOSIS — C9000 Multiple myeloma not having achieved remission: Secondary | ICD-10-CM

## 2014-06-17 DIAGNOSIS — Z5112 Encounter for antineoplastic immunotherapy: Secondary | ICD-10-CM

## 2014-06-17 LAB — CBC WITH DIFFERENTIAL (CANCER CENTER ONLY)
BASO#: 0 10*3/uL (ref 0.0–0.2)
BASO%: 0.3 % (ref 0.0–2.0)
EOS%: 3.4 % (ref 0.0–7.0)
Eosinophils Absolute: 0.1 10*3/uL (ref 0.0–0.5)
HCT: 29.4 % — ABNORMAL LOW (ref 34.8–46.6)
HGB: 9.6 g/dL — ABNORMAL LOW (ref 11.6–15.9)
LYMPH#: 1.2 10*3/uL (ref 0.9–3.3)
LYMPH%: 37.3 % (ref 14.0–48.0)
MCH: 29.6 pg (ref 26.0–34.0)
MCHC: 32.7 g/dL (ref 32.0–36.0)
MCV: 91 fL (ref 81–101)
MONO#: 0.6 10*3/uL (ref 0.1–0.9)
MONO%: 18 % — ABNORMAL HIGH (ref 0.0–13.0)
NEUT#: 1.3 10*3/uL — ABNORMAL LOW (ref 1.5–6.5)
NEUT%: 41 % (ref 39.6–80.0)
Platelets: 200 10*3/uL (ref 145–400)
RBC: 3.24 10*6/uL — ABNORMAL LOW (ref 3.70–5.32)
RDW: 15.3 % (ref 11.1–15.7)
WBC: 3.2 10*3/uL — ABNORMAL LOW (ref 3.9–10.0)

## 2014-06-17 LAB — CMP (CANCER CENTER ONLY)
ALK PHOS: 44 U/L (ref 26–84)
ALT(SGPT): 49 U/L — ABNORMAL HIGH (ref 10–47)
AST: 35 U/L (ref 11–38)
Albumin: 3.2 g/dL — ABNORMAL LOW (ref 3.3–5.5)
BUN, Bld: 13 mg/dL (ref 7–22)
CHLORIDE: 102 meq/L (ref 98–108)
CO2: 30 mEq/L (ref 18–33)
CREATININE: 0.8 mg/dL (ref 0.6–1.2)
Calcium: 9.1 mg/dL (ref 8.0–10.3)
GLUCOSE: 88 mg/dL (ref 73–118)
POTASSIUM: 3.9 meq/L (ref 3.3–4.7)
SODIUM: 145 meq/L (ref 128–145)
TOTAL PROTEIN: 8 g/dL (ref 6.4–8.1)
Total Bilirubin: 0.5 mg/dl (ref 0.20–1.60)

## 2014-06-17 MED ORDER — BORTEZOMIB CHEMO SQ INJECTION 3.5 MG (2.5MG/ML)
1.3000 mg/m2 | Freq: Once | INTRAMUSCULAR | Status: AC
  Administered 2014-06-17: 2.75 mg via SUBCUTANEOUS
  Filled 2014-06-17: qty 2.75

## 2014-06-17 MED ORDER — ONDANSETRON HCL 8 MG PO TABS
ORAL_TABLET | ORAL | Status: AC
  Filled 2014-06-17: qty 1

## 2014-06-17 MED ORDER — ONDANSETRON HCL 8 MG PO TABS
8.0000 mg | ORAL_TABLET | Freq: Once | ORAL | Status: AC
  Administered 2014-06-17: 8 mg via ORAL

## 2014-06-17 NOTE — Patient Instructions (Signed)
Bortezomib injection What is this medicine? BORTEZOMIB (bor TEZ oh mib) is a chemotherapy drug. It slows the growth of cancer cells. This medicine is used to treat multiple myeloma, and certain lymphomas, such as mantle-cell lymphoma. This medicine may be used for other purposes; ask your health care provider or pharmacist if you have questions. COMMON BRAND NAME(S): Velcade What should I tell my health care provider before I take this medicine? They need to know if you have any of these conditions: -diabetes -heart disease -irregular heartbeat -liver disease -on hemodialysis -low blood counts, like low white blood cells, platelets, or hemoglobin -peripheral neuropathy -taking medicine for blood pressure -an unusual or allergic reaction to bortezomib, mannitol, boron, other medicines, foods, dyes, or preservatives -pregnant or trying to get pregnant -breast-feeding How should I use this medicine? This medicine is for injection into a vein or for injection under the skin. It is given by a health care professional in a hospital or clinic setting. Talk to your pediatrician regarding the use of this medicine in children. Special care may be needed. Overdosage: If you think you have taken too much of this medicine contact a poison control center or emergency room at once. NOTE: This medicine is only for you. Do not share this medicine with others. What if I miss a dose? It is important not to miss your dose. Call your doctor or health care professional if you are unable to keep an appointment. What may interact with this medicine? This medicine may interact with the following medications: -ketoconazole -rifampin -ritonavir -St. John's Wort This list may not describe all possible interactions. Give your health care provider a list of all the medicines, herbs, non-prescription drugs, or dietary supplements you use. Also tell them if you smoke, drink alcohol, or use illegal drugs. Some items  may interact with your medicine. What should I watch for while using this medicine? Visit your doctor for checks on your progress. This drug may make you feel generally unwell. This is not uncommon, as chemotherapy can affect healthy cells as well as cancer cells. Report any side effects. Continue your course of treatment even though you feel ill unless your doctor tells you to stop. You may get drowsy or dizzy. Do not drive, use machinery, or do anything that needs mental alertness until you know how this medicine affects you. Do not stand or sit up quickly, especially if you are an older patient. This reduces the risk of dizzy or fainting spells. In some cases, you may be given additional medicines to help with side effects. Follow all directions for their use. Call your doctor or health care professional for advice if you get a fever, chills or sore throat, or other symptoms of a cold or flu. Do not treat yourself. This drug decreases your body's ability to fight infections. Try to avoid being around people who are sick. This medicine may increase your risk to bruise or bleed. Call your doctor or health care professional if you notice any unusual bleeding. You may need blood work done while you are taking this medicine. In some patients, this medicine may cause a serious brain infection that may cause death. If you have any problems seeing, thinking, speaking, walking, or standing, tell your doctor right away. If you cannot reach your doctor, urgently seek other source of medical care. Do not become pregnant while taking this medicine. Women should inform their doctor if they wish to become pregnant or think they might be pregnant. There is  a potential for serious side effects to an unborn child. Talk to your health care professional or pharmacist for more information. Do not breast-feed an infant while taking this medicine. Check with your doctor or health care professional if you get an attack of  severe diarrhea, nausea and vomiting, or if you sweat a lot. The loss of too much body fluid can make it dangerous for you to take this medicine. What side effects may I notice from receiving this medicine? Side effects that you should report to your doctor or health care professional as soon as possible: -allergic reactions like skin rash, itching or hives, swelling of the face, lips, or tongue -breathing problems -changes in hearing -changes in vision -fast, irregular heartbeat -feeling faint or lightheaded, falls -pain, tingling, numbness in the hands or feet -right upper belly pain -seizures -swelling of the ankles, feet, hands -unusual bleeding or bruising -unusually weak or tired -vomiting -yellowing of the eyes or skin Side effects that usually do not require medical attention (report to your doctor or health care professional if they continue or are bothersome): -changes in emotions or moods -constipation -diarrhea -loss of appetite -headache -irritation at site where injected -nausea This list may not describe all possible side effects. Call your doctor for medical advice about side effects. You may report side effects to FDA at 1-800-FDA-1088. Where should I keep my medicine? This drug is given in a hospital or clinic and will not be stored at home. NOTE: This sheet is a summary. It may not cover all possible information. If you have questions about this medicine, talk to your doctor, pharmacist, or health care provider.  2015, [employer] Standard. (2013-02-19 12:46:32)   

## 2014-06-24 ENCOUNTER — Ambulatory Visit: Payer: 59

## 2014-06-24 ENCOUNTER — Other Ambulatory Visit: Payer: 59 | Admitting: Lab

## 2014-06-26 ENCOUNTER — Telehealth: Payer: Self-pay | Admitting: Nurse Practitioner

## 2014-06-26 NOTE — Telephone Encounter (Signed)
[facility] papers completed and faxed back to  [phone]. Confirmation received.

## 2014-06-27 ENCOUNTER — Ambulatory Visit
Admission: RE | Admit: 2014-06-27 | Discharge: 2014-06-27 | Disposition: A | Discharge status: Home / Self Care | Payer: 59 | Source: Ambulatory Visit | Attending: Radiation Oncology | Admitting: Radiation Oncology

## 2014-06-27 ENCOUNTER — Encounter: Payer: Self-pay | Admitting: Radiation Oncology

## 2014-06-27 VITALS — BP 146/73 | HR 92 | Temp 97.6°F | Resp 12 | Ht 64.0 in | Wt 186.8 lb

## 2014-06-27 DIAGNOSIS — C9000 Multiple myeloma not having achieved remission: Secondary | ICD-10-CM

## 2014-06-27 MED ORDER — SUCRALFATE 1 GM/10ML PO SUSP: 1.0000 g | Freq: Three times a day (TID) | ORAL | Status: DC

## 2014-06-27 NOTE — Progress Notes (Signed)
Barbara Cook here for follow up after treatment to her T9-L1 spine.  She reports pain at a 7/10 in her lower back.  She reports the pain is worse when getting in and out of the car.  She continues to take Revlimid and decadron 3 tablets a week.  She reports that occasionally when swallowing, food will get down to her abdomen and then feels like it is coming back up.  She is taking Protonix and would like a refill on Carafate.  She denies nausea.  She has lost 15 lbs since 05/20/14 and has been trying to loose weight.  She has hyperpigmentation on her mid back and chest.  She is using radiaplex.  She reports feeling "listless, weak and tired."  BP 146/73 mmHg  Pulse 92  Resp 12  Ht 5\' 4"  (1.626 m)  Wt 186 lb 12.8 oz (84.732 kg)  BMI 32.05 kg/m2  SpO2 99%

## 2014-06-27 NOTE — Progress Notes (Signed)
Radiation Oncology         [phone] ________________________________  Name: Barbara Cook MRN: [id number]  Date: 2020-01-1715  DOB: 23-Oct-1955  Follow-Up Visit Note  CC: Horatio Pel, MD  Shelia [employer] Leonidas Romberg,*    ICD-9-CM ICD-10-CM   1. Multiple myeloma 203.00 C90.00     Diagnosis:   Multiple myeloma  Interval Since Last Radiation:  1  months  Narrative:  The patient returns today for routine follow-up.  She has started Revlimid and Decadron for her myeloma. She does have some generalized fatigue with this therapy. She continues to have some pain in the low back but overall is better. She rates her pain is 7 out of 10 but this is controlled with Tylenol. She denies any numbness or weakness in her lower extremities.  She continues to have some mild discomfort with swallowing and would like to have a refill on her Carafate. I have placed a refill order for this medication.                        ALLERGIES:  is allergic to codeine.  Meds: Current Outpatient Prescriptions  Medication Sig Dispense Refill  . acetaminophen (TYLENOL) 650 MG CR tablet Take 1,300 mg by mouth every 8 (eight) hours as needed for pain.     Marland Kitchen aspirin 325 MG tablet Take 325 mg by mouth daily.    . bortezomib IV (VELCADE) 3.5 MG injection Inject 2.75 mg into the vein every 7 (seven) days.    . BucAlfAspKGlucCouchParsUvaUrJu (WATER PILLS PO) Take 1 tablet by mouth 3 (three) times daily.    . Cholecalciferol (VITAMIN D) 1000 UNITS capsule Take 2,000 Units by mouth 2 (two) times daily. 2 IN AM AND 2 IN PM    . dexamethasone (DECADRON) 4 MG tablet Take 5 tablets once a week with food. 80 tablet 4  . hyaluronate sodium (RADIAPLEXRX) GEL Apply 1 application topically once.    Marland Kitchen lenalidomide (REVLIMID) 25 MG capsule Take 1 capsule at bedtime daily for 21 days and then off for 7 days. [id number] 21 capsule 6  . Magnesium 250 MG TABS Take 2 tablets by mouth daily. Takes 500 mg daily    . Multiple Vitamin  (MULTIVITAMIN) tablet Take 1 tablet by mouth every evening.     . ondansetron (ZOFRAN) 8 MG tablet Take 71m twice a day. Start the day after chemo for 2 days. Then take for nausea and vomiting twice a day as needed. 30 tablet 3  . pantoprazole (PROTONIX) 40 MG tablet Take 1 tablet (40 mg total) by mouth 2 (two) times daily. 60 tablet 6  . senna-docusate (SENOKOT-S) 8.6-50 MG per tablet Take 4 tablets by mouth 2 (two) times daily.    . sucralfate (CARAFATE) 1 GM/10ML suspension Take 10 mLs (1 g total) by mouth 4 (four) times daily -  with meals and at bedtime. 420 mL 0  . torsemide (DEMADEX) 20 MG tablet Take 1 tablet (20 mg total) by mouth daily as needed. 30 tablet 6  . cephALEXin (KEFLEX) 500 MG capsule Take 1 capsule (500 mg total) by mouth 2 (two) times daily. (Patient not taking: Reported on 2020-01-1715) 14 capsule 0  . Chlorophyll (CHLOROXYGEN PO) Take 36 drops by mouth. Takes 36 drops twice a day    . famciclovir (FAMVIR) 500 MG tablet Take 1 tablet (500 mg total) by mouth daily. Take 1 pill daily. (Patient taking differently: Take 500 mg by mouth daily. ) 30  tablet 0  . fluconazole (DIFLUCAN) 150 MG tablet Take 1 tablet (150 mg total) by mouth daily. For three days. (Patient not taking: Reported on 07-19-202016) 3 tablet 2  . loperamide (IMODIUM) 2 MG capsule Take by mouth as needed for diarrhea or loose stools.    . METHYLCOBALAMIN PO Take 1,000 mcg by mouth daily.    . NON FORMULARY 3 capsules 3 (three) times daily. Acid-2-alkaline    . Noni, Morinda citrifolia, (NONI JUICE PO) Take 1 oz by mouth daily.    Marland Kitchen OVER THE COUNTER MEDICATION 3 capsules 2 (two) times daily. curamin    . prochlorperazine (COMPAZINE) 10 MG tablet Take 1 tablet (10 mg total) by mouth every 6 (six) hours as needed for nausea or vomiting. (Patient not taking: Reported on 07-19-202016) 30 tablet 0   No current facility-administered medications for this encounter.    Physical Findings: The patient is in no acute distress.  Patient is alert and oriented.  height is 5' 4"  (1.626 m) and weight is 186 lb 12.8 oz (84.732 kg). Her oral temperature is 97.6 F (36.4 C). Her blood pressure is 146/73 and her pulse is 92. Her respiration is 12 and oxygen saturation is 99%. . The lungs are clear. The heart has a regular rhythm and rate. The abdomen is soft and nontender with normal bowel sounds.  Lab Findings: Lab Results  Component Value Date   WBC 3.2* 06/17/2014   HGB 9.6* 06/17/2014   HCT 29.4* 06/17/2014   MCV 91 06/17/2014   PLT 200 06/17/2014    Radiographic Findings: Dg Chest 2 View  06/03/2014   CLINICAL DATA:  Fever developed last night around 2200 hr. Bilateral lower extremity swelling. Previous smoker. History of multiple myeloma.  EXAM: CHEST  2 VIEW  COMPARISON:  01/05/2014  FINDINGS: Shallow inspiration. Heart size and pulmonary vascularity are normal for technique. Prominent right paratracheal soft tissue swelling again demonstrated, similar to previous study. This corresponds to lymphadenopathy on prior chest CT. No focal airspace disease or consolidation in the lungs. No blunting of costophrenic angles. No pneumothorax. Old appearing compression deformity of a lower thoracic vertebra, likely T12. Calcified and tortuous aorta. Visualized ribs appear grossly intact.  IMPRESSION: Right peritracheal lymphadenopathy similar to previous studies. No evidence of active pulmonary disease. T12 compression consistent with known metastatic disease.   Electronically Signed   By: Lucienne Capers M.D.   On: 06/03/2014 01:56    Impression:  The patient is recovering from the effects of radiation.    Plan:  When necessary follow-up in radiation oncology. The patient will have refill on her Carafate. She will call if her symptoms did not resolve after completing Carafate.  ____________________________________ Blair Promise, MD

## 2014-07-01 ENCOUNTER — Ambulatory Visit (HOSPITAL_BASED_OUTPATIENT_CLINIC_OR_DEPARTMENT_OTHER): Payer: 59 | Admitting: Hematology & Oncology

## 2014-07-01 ENCOUNTER — Other Ambulatory Visit: Payer: 59 | Admitting: Lab

## 2014-07-01 ENCOUNTER — Encounter: Payer: Self-pay | Admitting: Hematology & Oncology

## 2014-07-01 ENCOUNTER — Other Ambulatory Visit (HOSPITAL_BASED_OUTPATIENT_CLINIC_OR_DEPARTMENT_OTHER): Payer: 59 | Admitting: Lab

## 2014-07-01 ENCOUNTER — Ambulatory Visit: Payer: 59

## 2014-07-01 ENCOUNTER — Ambulatory Visit (HOSPITAL_BASED_OUTPATIENT_CLINIC_OR_DEPARTMENT_OTHER): Payer: 59

## 2014-07-01 VITALS — BP 126/48 | HR 87 | Temp 98.0°F | Resp 16 | Ht 64.0 in | Wt 189.0 lb

## 2014-07-01 DIAGNOSIS — C9000 Multiple myeloma not having achieved remission: Secondary | ICD-10-CM

## 2014-07-01 DIAGNOSIS — M545 Low back pain, unspecified: Secondary | ICD-10-CM

## 2014-07-01 DIAGNOSIS — Z5112 Encounter for antineoplastic immunotherapy: Secondary | ICD-10-CM

## 2014-07-01 LAB — CBC WITH DIFFERENTIAL (CANCER CENTER ONLY)
BASO#: 0 10*3/uL (ref 0.0–0.2)
BASO%: 0.3 % (ref 0.0–2.0)
EOS ABS: 0.3 10*3/uL (ref 0.0–0.5)
EOS%: 10.6 % — ABNORMAL HIGH (ref 0.0–7.0)
HCT: 31.1 % — ABNORMAL LOW (ref 34.8–46.6)
HGB: 10.1 g/dL — ABNORMAL LOW (ref 11.6–15.9)
LYMPH#: 1.1 10*3/uL (ref 0.9–3.3)
LYMPH%: 34.4 % (ref 14.0–48.0)
MCH: 29.2 pg (ref 26.0–34.0)
MCHC: 32.5 g/dL (ref 32.0–36.0)
MCV: 90 fL (ref 81–101)
MONO#: 0.3 10*3/uL (ref 0.1–0.9)
MONO%: 8.8 % (ref 0.0–13.0)
NEUT%: 45.9 % (ref 39.6–80.0)
NEUTROS ABS: 1.5 10*3/uL (ref 1.5–6.5)
Platelets: 135 10*3/uL — ABNORMAL LOW (ref 145–400)
RBC: 3.46 10*6/uL — ABNORMAL LOW (ref 3.70–5.32)
RDW: 15.4 % (ref 11.1–15.7)
WBC: 3.2 10*3/uL — AB (ref 3.9–10.0)

## 2014-07-01 LAB — CMP (CANCER CENTER ONLY)
ALBUMIN: 3.4 g/dL (ref 3.3–5.5)
ALT: 74 U/L — AB (ref 10–47)
AST: 42 U/L — AB (ref 11–38)
Alkaline Phosphatase: 55 U/L (ref 26–84)
BUN, Bld: 10 mg/dL (ref 7–22)
CO2: 30 mEq/L (ref 18–33)
CREATININE: 1 mg/dL (ref 0.6–1.2)
Calcium: 9 mg/dL (ref 8.0–10.3)
Chloride: 100 mEq/L (ref 98–108)
Glucose, Bld: 96 mg/dL (ref 73–118)
POTASSIUM: 3.2 meq/L — AB (ref 3.3–4.7)
SODIUM: 142 meq/L (ref 128–145)
Total Bilirubin: 0.7 mg/dl (ref 0.20–1.60)
Total Protein: 7.9 g/dL (ref 6.4–8.1)

## 2014-07-01 MED ORDER — BORTEZOMIB CHEMO SQ INJECTION 3.5 MG (2.5MG/ML)
1.3000 mg/m2 | Freq: Once | INTRAMUSCULAR | Status: AC
  Administered 2014-07-01: 2.75 mg via SUBCUTANEOUS
  Filled 2014-07-01: qty 2.75

## 2014-07-01 MED ORDER — ONDANSETRON HCL 8 MG PO TABS
8.0000 mg | ORAL_TABLET | Freq: Once | ORAL | Status: AC
Start: 2014-07-01 — End: 2014-07-01
  Administered 2014-07-01: 8 mg via ORAL

## 2014-07-01 MED ORDER — ONDANSETRON HCL 8 MG PO TABS
ORAL_TABLET | ORAL | Status: AC
  Filled 2014-07-01: qty 1

## 2014-07-01 NOTE — Patient Instructions (Signed)
Bortezomib injection What is this medicine? BORTEZOMIB (bor TEZ oh mib) is a chemotherapy drug. It slows the growth of cancer cells. This medicine is used to treat multiple myeloma, and certain lymphomas, such as mantle-cell lymphoma. This medicine may be used for other purposes; ask your health care provider or pharmacist if you have questions. COMMON BRAND NAME(S): Velcade What should I tell my health care provider before I take this medicine? They need to know if you have any of these conditions: -diabetes -heart disease -irregular heartbeat -liver disease -on hemodialysis -low blood counts, like low white blood cells, platelets, or hemoglobin -peripheral neuropathy -taking medicine for blood pressure -an unusual or allergic reaction to bortezomib, mannitol, boron, other medicines, foods, dyes, or preservatives -pregnant or trying to get pregnant -breast-feeding How should I use this medicine? This medicine is for injection into a vein or for injection under the skin. It is given by a health care professional in a hospital or clinic setting. Talk to your pediatrician regarding the use of this medicine in children. Special care may be needed. Overdosage: If you think you have taken too much of this medicine contact a poison control center or emergency room at once. NOTE: This medicine is only for you. Do not share this medicine with others. What if I miss a dose? It is important not to miss your dose. Call your doctor or health care professional if you are unable to keep an appointment. What may interact with this medicine? This medicine may interact with the following medications: -ketoconazole -rifampin -ritonavir -St. John's Wort This list may not describe all possible interactions. Give your health care provider a list of all the medicines, herbs, non-prescription drugs, or dietary supplements you use. Also tell them if you smoke, drink alcohol, or use illegal drugs. Some items  may interact with your medicine. What should I watch for while using this medicine? Visit your doctor for checks on your progress. This drug may make you feel generally unwell. This is not uncommon, as chemotherapy can affect healthy cells as well as cancer cells. Report any side effects. Continue your course of treatment even though you feel ill unless your doctor tells you to stop. You may get drowsy or dizzy. Do not drive, use machinery, or do anything that needs mental alertness until you know how this medicine affects you. Do not stand or sit up quickly, especially if you are an older patient. This reduces the risk of dizzy or fainting spells. In some cases, you may be given additional medicines to help with side effects. Follow all directions for their use. Call your doctor or health care professional for advice if you get a fever, chills or sore throat, or other symptoms of a cold or flu. Do not treat yourself. This drug decreases your body's ability to fight infections. Try to avoid being around people who are sick. This medicine may increase your risk to bruise or bleed. Call your doctor or health care professional if you notice any unusual bleeding. You may need blood work done while you are taking this medicine. In some patients, this medicine may cause a serious brain infection that may cause death. If you have any problems seeing, thinking, speaking, walking, or standing, tell your doctor right away. If you cannot reach your doctor, urgently seek other source of medical care. Do not become pregnant while taking this medicine. Women should inform their doctor if they wish to become pregnant or think they might be pregnant. There is   a potential for serious side effects to an unborn child. Talk to your health care professional or pharmacist for more information. Do not breast-feed an infant while taking this medicine. Check with your doctor or health care professional if you get an attack of  severe diarrhea, nausea and vomiting, or if you sweat a lot. The loss of too much body fluid can make it dangerous for you to take this medicine. What side effects may I notice from receiving this medicine? Side effects that you should report to your doctor or health care professional as soon as possible: -allergic reactions like skin rash, itching or hives, swelling of the face, lips, or tongue -breathing problems -changes in hearing -changes in vision -fast, irregular heartbeat -feeling faint or lightheaded, falls -pain, tingling, numbness in the hands or feet -right upper belly pain -seizures -swelling of the ankles, feet, hands -unusual bleeding or bruising -unusually weak or tired -vomiting -yellowing of the eyes or skin Side effects that usually do not require medical attention (report to your doctor or health care professional if they continue or are bothersome): -changes in emotions or moods -constipation -diarrhea -loss of appetite -headache -irritation at site where injected -nausea This list may not describe all possible side effects. Call your doctor for medical advice about side effects. You may report side effects to FDA at 1-800-FDA-1088. Where should I keep my medicine? This drug is given in a hospital or clinic and will not be stored at home. NOTE: This sheet is a summary. It may not cover all possible information. If you have questions about this medicine, talk to your doctor, pharmacist, or health care provider.  2015, [employer] Standard. (2013-02-19 12:46:32)   

## 2014-07-01 NOTE — Progress Notes (Signed)
Hematology and Oncology Follow Up Visit  Barbara Cook [id number] 04-Mar-1956 59 y.o. 07/01/2014   Principle Diagnosis:  IgG kappa myeloma   Current Therapy:     Velcade/Revlimid/Decadron   Zometa 4 mg IV every month     Interim History:  Ms.  Cook is back for a follow-up. She seems to be doing okay. The main problem is that she's having more back pain. 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 She had radiation therapy back there. She completed radiation therapy back in general work. I will repeat a MRI. I think if the MRI shows a compression fracture, I talked her about kyphoplasty. I think kyphoplasty would be a very good way of helping her pain.  She is only taking Tylenol for the pain.  I think she is doing well with her treatments. Other she is tolerating this very well.  We have her on 12 mg of Decadron weekly. The 20 mg dose was a little too much for her. She wants to try to go up to 16 mg.  Her last myeloma studies showed her M spike to be 2.23 g/dL. Her IgG level was 3150 mg/dL. Her kappa light chain was 4.87 mg/dL. This is all improved.  Currently, her performance status is ECOG 1    Medications:  Current outpatient prescriptions:  .  acetaminophen (TYLENOL) 650 MG CR tablet, Take 1,300 mg by mouth every 8 (eight) hours as needed for pain. , Disp: , Rfl:  .  aspirin 325 MG tablet, Take 325 mg by mouth daily., Disp: , Rfl:  .  bortezomib IV (VELCADE) 3.5 MG injection, Inject 2.75 mg into the vein every 7 (seven) days., Disp: , Rfl:  .  Cholecalciferol (VITAMIN D) 1000 UNITS capsule, Take 2,000 Units by mouth 2 (two) times daily. 2 IN AM AND 2 IN PM, Disp: , Rfl:  .  dexamethasone (DECADRON) 4 MG tablet, Take 5 tablets once a week with food., Disp: 80 tablet, Rfl: 4 .  hyaluronate sodium (RADIAPLEXRX) GEL, Apply 1 application topically once., Disp: , Rfl:  .  lenalidomide (REVLIMID) 25 MG capsule, Take 1 capsule at bedtime daily for 21 days  and then off for 7 days. [id number], Disp: 21 capsule, Rfl: 6 .  Magnesium 250 MG TABS, Take 2 tablets by mouth daily. Takes 500 mg daily, Disp: , Rfl:  .  Multiple Vitamin (MULTIVITAMIN) tablet, Take 1 tablet by mouth every evening. , Disp: , Rfl:  .  ondansetron (ZOFRAN) 8 MG tablet, Take 8mg  twice a day. Start the day after chemo for 2 days. Then take for nausea and vomiting twice a day as needed., Disp: 30 tablet, Rfl: 3 .  pantoprazole (PROTONIX) 40 MG tablet, Take 1 tablet (40 mg total) by mouth 2 (two) times daily., Disp: 60 tablet, Rfl: 6 .  senna-docusate (SENOKOT-S) 8.6-50 MG per tablet, Take 4 tablets by mouth 2 (two) times daily., Disp: , Rfl:  .  sucralfate (CARAFATE) 1 GM/10ML suspension, Take 10 mLs (1 g total) by mouth 4 (four) times daily -  with meals and at bedtime., Disp: 420 mL, Rfl: 0 .  torsemide (DEMADEX) 20 MG tablet, Take 1 tablet (20 mg total) by mouth daily as needed., Disp: 30 tablet, Rfl: 6 .  loperamide (IMODIUM) 2 MG capsule, Take by mouth as needed for diarrhea or loose stools., Disp: , Rfl:  .  METHYLCOBALAMIN PO, Take 1,000 mcg by mouth daily., Disp: , Rfl:  .  prochlorperazine (COMPAZINE) 10 MG tablet, Take 1 tablet (10 mg total) by mouth every 6 (six) hours as needed for nausea or vomiting. (Patient not taking: Reported on 05/23/2014), Disp: 30 tablet, Rfl: 0  Allergies:  Allergies  Allergen Reactions  . Codeine Palpitations    Past Medical History, Surgical history, Social history, and Family History were reviewed and updated.  Review of Systems: As above  Physical Exam:  height is 5\' 4"  (1.626 m) and weight is 189 lb (85.73 kg). Her oral temperature is 98 F (36.7 C). Her blood pressure is 126/48 and her pulse is 87. Her respiration is 16.   Well-developed and well-nourished African American female. Head and neck exam shows no ocular or oral lesions. There are no palpable cervical or supraclavicular lymph nodes. Lungs are clear. Cardiac exam regular  rate and rhythm with no murmurs, rubs or bruits. Abdomen is soft. She is mildly obese. She is no palpable liver or spleen tip. Back exam shows no tenderness over the spine, ribs or hips. Extremity shows no clubbing, cyanosis or edema. Skin exam shows no rashes, ecchymoses or petechia. Neurological exam is nonfocal.  Lab Results  Component Value Date   WBC 3.2* 07/01/2014   HGB 10.1* 07/01/2014   HCT 31.1* 07/01/2014   MCV 90 07/01/2014   PLT 135* 07/01/2014     Chemistry      Component Value Date/Time   NA 142 07/01/2014 1354   NA 133* 06/03/2014 0122   K 3.2* 07/01/2014 1354   K 3.9 06/03/2014 0122   CL 100 07/01/2014 1354   CL 104 06/03/2014 0122   CO2 30 07/01/2014 1354   CO2 24 06/03/2014 0122   BUN 10 07/01/2014 1354   BUN 12 06/03/2014 0122   CREATININE 1.0 07/01/2014 1354   CREATININE 0.82 06/03/2014 0122      Component Value Date/Time   CALCIUM 9.0 07/01/2014 1354   CALCIUM 7.8* 06/03/2014 0122   ALKPHOS 55 07/01/2014 1354   ALKPHOS 47 06/03/2014 0122   AST 42* 07/01/2014 1354   AST 40* 06/03/2014 0122   ALT 74* 07/01/2014 1354   ALT 43* 06/03/2014 0122   BILITOT 0.70 07/01/2014 1354   BILITOT 0.5 06/03/2014 0122         Impression and Plan: Barbara Cook is 59 year old African-American female with IgG kappa myeloma.   She is responding. Her M spike has been coming down.   As always, she has a lot of questions for me. I spent about 40 minutes answering them for her and her husband.  She does not want Zometa with the Velcade.  I will repeat the MRI. The back pain that she is having is a little troublesome. She may have chronic compression fracture that may need a kyphoplasty. I think if she had kyphoplasty, this would help quite a bit.  I told her that she probably would qualify for total and permanent disability. I see her being on chemotherapy for long-term. I think that this will cause significant fatigue. I think she will be at a risk for infections if  she is around other people. I think that she will not have the mental capability to do work.  I will plan to get her back in one month, or sooner if there is a problem.     [name], MD 2/22/20166:15 PM

## 2014-07-02 ENCOUNTER — Telehealth: Payer: Self-pay | Admitting: Hematology & Oncology

## 2014-07-02 NOTE — Telephone Encounter (Signed)
Pt aware of 2-27 MRI and will get schedule for her appointments tomorrow when she comes in.

## 2014-07-03 ENCOUNTER — Telehealth: Payer: Self-pay | Admitting: Hematology & Oncology

## 2014-07-03 ENCOUNTER — Ambulatory Visit: Payer: 59

## 2014-07-03 NOTE — Telephone Encounter (Signed)
Pt sick, called to cancel today. I also told pt her disability papers are complete and ready for pickup.

## 2014-07-04 ENCOUNTER — Other Ambulatory Visit: Payer: Self-pay | Admitting: Nurse Practitioner

## 2014-07-04 DIAGNOSIS — C9000 Multiple myeloma not having achieved remission: Secondary | ICD-10-CM

## 2014-07-04 MED ORDER — LENALIDOMIDE 25 MG PO CAPS: ORAL_CAPSULE | ORAL | Status: DC

## 2014-07-06 ENCOUNTER — Ambulatory Visit (HOSPITAL_BASED_OUTPATIENT_CLINIC_OR_DEPARTMENT_OTHER)
Admission: RE | Admit: 2014-07-06 | Discharge: 2014-07-06 | Disposition: A | Discharge status: Home / Self Care | Payer: 59 | Source: Ambulatory Visit | Attending: Hematology & Oncology | Admitting: Hematology & Oncology

## 2014-07-06 DIAGNOSIS — M545 Low back pain, unspecified: Secondary | ICD-10-CM

## 2014-07-06 DIAGNOSIS — M8938 Hypertrophy of bone, other site: Secondary | ICD-10-CM | POA: Insufficient documentation

## 2014-07-06 DIAGNOSIS — M8448XS Pathological fracture, other site, sequela: Secondary | ICD-10-CM | POA: Diagnosis not present

## 2014-07-06 DIAGNOSIS — M488X8 Other specified spondylopathies, sacral and sacrococcygeal region: Secondary | ICD-10-CM | POA: Diagnosis not present

## 2014-07-06 DIAGNOSIS — C9030 Solitary plasmacytoma not having achieved remission: Secondary | ICD-10-CM | POA: Diagnosis not present

## 2014-07-06 DIAGNOSIS — C9000 Multiple myeloma not having achieved remission: Secondary | ICD-10-CM

## 2014-07-06 MED ORDER — GADOBENATE DIMEGLUMINE 529 MG/ML IV SOLN: 19.0000 mL/kg | Freq: Once | INTRAVENOUS | Status: AC | PRN

## 2014-07-08 ENCOUNTER — Ambulatory Visit (HOSPITAL_BASED_OUTPATIENT_CLINIC_OR_DEPARTMENT_OTHER): Payer: 59

## 2014-07-08 ENCOUNTER — Ambulatory Visit (HOSPITAL_BASED_OUTPATIENT_CLINIC_OR_DEPARTMENT_OTHER): Payer: 59 | Admitting: Lab

## 2014-07-08 ENCOUNTER — Ambulatory Visit (HOSPITAL_BASED_OUTPATIENT_CLINIC_OR_DEPARTMENT_OTHER): Payer: 59 | Admitting: Hematology & Oncology

## 2014-07-08 DIAGNOSIS — C9000 Multiple myeloma not having achieved remission: Secondary | ICD-10-CM

## 2014-07-08 DIAGNOSIS — M545 Low back pain, unspecified: Secondary | ICD-10-CM

## 2014-07-08 DIAGNOSIS — Z5111 Encounter for antineoplastic chemotherapy: Secondary | ICD-10-CM

## 2014-07-08 LAB — CMP (CANCER CENTER ONLY)
ALK PHOS: 51 U/L (ref 26–84)
ALT: 53 U/L — AB (ref 10–47)
AST: 33 U/L (ref 11–38)
Albumin: 3.5 g/dL (ref 3.3–5.5)
BUN: 12 mg/dL (ref 7–22)
CHLORIDE: 94 meq/L — AB (ref 98–108)
CO2: 33 mEq/L (ref 18–33)
Calcium: 9.1 mg/dL (ref 8.0–10.3)
Creat: 0.9 mg/dl (ref 0.6–1.2)
Glucose, Bld: 86 mg/dL (ref 73–118)
Potassium: 3.4 mEq/L (ref 3.3–4.7)
Sodium: 142 mEq/L (ref 128–145)
Total Bilirubin: 0.7 mg/dl (ref 0.20–1.60)
Total Protein: 8 g/dL (ref 6.4–8.1)

## 2014-07-08 LAB — CBC WITH DIFFERENTIAL (CANCER CENTER ONLY)
BASO#: 0 10*3/uL (ref 0.0–0.2)
BASO%: 0.3 % (ref 0.0–2.0)
EOS%: 7.3 % — ABNORMAL HIGH (ref 0.0–7.0)
Eosinophils Absolute: 0.2 10*3/uL (ref 0.0–0.5)
HEMATOCRIT: 30.5 % — AB (ref 34.8–46.6)
HGB: 10 g/dL — ABNORMAL LOW (ref 11.6–15.9)
LYMPH#: 1.3 10*3/uL (ref 0.9–3.3)
LYMPH%: 43.7 % (ref 14.0–48.0)
MCH: 29.3 pg (ref 26.0–34.0)
MCHC: 32.8 g/dL (ref 32.0–36.0)
MCV: 89 fL (ref 81–101)
MONO#: 0.4 10*3/uL (ref 0.1–0.9)
MONO%: 14.3 % — ABNORMAL HIGH (ref 0.0–13.0)
NEUT#: 1 10*3/uL — ABNORMAL LOW (ref 1.5–6.5)
NEUT%: 34.4 % — AB (ref 39.6–80.0)
PLATELETS: 123 10*3/uL — AB (ref 145–400)
RBC: 3.41 10*6/uL — ABNORMAL LOW (ref 3.70–5.32)
RDW: 15.1 % (ref 11.1–15.7)
WBC: 3 10*3/uL — AB (ref 3.9–10.0)

## 2014-07-08 MED ORDER — ONDANSETRON HCL 8 MG PO TABS
8.0000 mg | ORAL_TABLET | Freq: Once | ORAL | Status: AC
Start: 2014-07-08 — End: 2014-07-08
  Administered 2014-07-08: 8 mg via ORAL

## 2014-07-08 MED ORDER — ONDANSETRON HCL 8 MG PO TABS
ORAL_TABLET | ORAL | Status: AC
  Filled 2014-07-08: qty 1

## 2014-07-08 MED ORDER — BORTEZOMIB CHEMO SQ INJECTION 3.5 MG (2.5MG/ML)
1.3000 mg/m2 | Freq: Once | INTRAMUSCULAR | Status: AC
  Administered 2014-07-08: 2.75 mg via SUBCUTANEOUS
  Filled 2014-07-08: qty 2.75

## 2014-07-08 NOTE — Progress Notes (Signed)
Hematology and Oncology Follow Up Visit  RILLIE RIFFEL [id number] 1955-12-11 59 y.o. 07/08/2014   Principle Diagnosis:  IgG kappa myeloma   Current Therapy:     Velcade/Revlimid/Decadron   Zometa 4 mg IV every month     Interim History:  Ms.  Croft is back for an unscheduled visit. She is in getting her Velcade. She had her MRI done over the weekend. This was of the lumbar sacral spine. This showed that the T12 fracture was somewhat worse. Again the bone was treated. I think that the fracture is just because of the weekend vertebral body.  I explained to her low was going on. I expect her that I thought that the best way to help this would be kyphoplasty. I explained to her what kyphoplasty was.  I had recommended kyphoplasty due to her back when the T12 fracture was first found. She was somewhat reluctant to do anything invasive. She agreed to a radiation which she was given which had worked. However, I think the bone itself is just inherently weak from the radiation and the weightbearing that occurs at the T12 level.  I don't think that there is any reason that she should not have the kyphoplasty. I told her that this was an outpatient procedure and that it is very effective and probably 90% of patients.  She has been having some diarrhea with the Velcade. I told her to try some Imodium.  She is on Zometa but I think his only had 1 or 2 treatments to date.  -Currently, her performance status is ECOG 1    Medications:  Current outpatient prescriptions:  .  acetaminophen (TYLENOL) 650 MG CR tablet, Take 1,300 mg by mouth every 8 (eight) hours as needed for pain. , Disp: , Rfl:  .  aspirin 325 MG tablet, Take 325 mg by mouth daily., Disp: , Rfl:  .  bortezomib IV (VELCADE) 3.5 MG injection, Inject 2.75 mg into the vein every 7 (seven) days., Disp: , Rfl:  .  Cholecalciferol (VITAMIN D) 1000 UNITS capsule, Take 2,000 Units by mouth 2 (two) times daily. 2 IN AM AND 2 IN PM, Disp:  , Rfl:  .  dexamethasone (DECADRON) 4 MG tablet, Take 5 tablets once a week with food., Disp: 80 tablet, Rfl: 4 .  hyaluronate sodium (RADIAPLEXRX) GEL, Apply 1 application topically once., Disp: , Rfl:  .  lenalidomide (REVLIMID) 25 MG capsule, Take 1 capsule at bedtime daily for 21 days and then off for 7 days. [id number], Disp: 21 capsule, Rfl: 6 .  loperamide (IMODIUM) 2 MG capsule, Take by mouth as needed for diarrhea or loose stools., Disp: , Rfl:  .  Magnesium 250 MG TABS, Take 2 tablets by mouth daily. Takes 500 mg daily, Disp: , Rfl:  .  METHYLCOBALAMIN PO, Take 1,000 mcg by mouth daily., Disp: , Rfl:  .  Multiple Vitamin (MULTIVITAMIN) tablet, Take 1 tablet by mouth every evening. , Disp: , Rfl:  .  ondansetron (ZOFRAN) 8 MG tablet, Take 8mg  twice a day. Start the day after chemo for 2 days. Then take for nausea and vomiting twice a day as needed., Disp: 30 tablet, Rfl: 3 .  pantoprazole (PROTONIX) 40 MG tablet, Take 1 tablet (40 mg total) by mouth 2 (two) times daily., Disp: 60 tablet, Rfl: 6 .  prochlorperazine (COMPAZINE) 10 MG tablet, Take 1 tablet (10 mg total) by mouth every 6 (six) hours as needed for nausea or vomiting. (Patient not taking: Reported on  07/06/202016), Disp: 30 tablet, Rfl: 0 .  senna-docusate (SENOKOT-S) 8.6-50 MG per tablet, Take 4 tablets by mouth 2 (two) times daily., Disp: , Rfl:  .  sucralfate (CARAFATE) 1 GM/10ML suspension, Take 10 mLs (1 g total) by mouth 4 (four) times daily -  with meals and at bedtime., Disp: 420 mL, Rfl: 0 .  torsemide (DEMADEX) 20 MG tablet, Take 1 tablet (20 mg total) by mouth daily as needed., Disp: 30 tablet, Rfl: 6  Allergies:  Allergies  Allergen Reactions  . Codeine Palpitations    Past Medical History, Surgical history, Social history, and Family History were reviewed and updated.  Review of Systems: As above  Physical Exam:  vitals were not taken for this visit.  Well-developed and well-nourished African American  female. Head and neck exam shows no ocular or oral lesions. There are no palpable cervical or supraclavicular lymph nodes. Lungs are clear. Cardiac exam regular rate and rhythm with no murmurs, rubs or bruits. Abdomen is soft. She is mildly obese. She is no palpable liver or spleen tip. Back exam shows no tenderness over the spine, ribs or hips. Extremity shows no clubbing, cyanosis or edema. Skin exam shows no rashes, ecchymoses or petechia. Neurological exam is nonfocal.  Lab Results  Component Value Date   WBC 3.0* 07/08/2014   HGB 10.0* 07/08/2014   HCT 30.5* 07/08/2014   MCV 89 07/08/2014   PLT 123* 07/08/2014     Chemistry      Component Value Date/Time   NA 142 07/08/2014 1211   NA 133* 06/03/2014 0122   K 3.4 07/08/2014 1211   K 3.9 06/03/2014 0122   CL 94* 07/08/2014 1211   CL 104 06/03/2014 0122   CO2 33 07/08/2014 1211   CO2 24 06/03/2014 0122   BUN 12 07/08/2014 1211   BUN 12 06/03/2014 0122   CREATININE 0.9 07/08/2014 1211   CREATININE 0.82 06/03/2014 0122      Component Value Date/Time   CALCIUM 9.1 07/08/2014 1211   CALCIUM 7.8* 06/03/2014 0122   ALKPHOS 51 07/08/2014 1211   ALKPHOS 47 06/03/2014 0122   AST 33 07/08/2014 1211   AST 40* 06/03/2014 0122   ALT 53* 07/08/2014 1211   ALT 43* 06/03/2014 0122   BILITOT 0.70 07/08/2014 1211   BILITOT 0.5 06/03/2014 0122         Impression and Plan: Ms. Banka is 59 year old African-American female with IgG kappa myeloma.   It is no surprise that the T12 fracture is getting worse. I think with the radiation, the bone has just been weakened. She really has not had a lot of the Zometa that we want her to have.  I talked to her and she agrees to kyphoplasty. I really think that kyphoplasty will be the best way to try to help this fracture and the pain.  I spent about 35 minutes with him. I went over the MRI. I explained why I thought the fracture was getting worse. I told her that I do not think this was from the  myeloma K worse but that the bone, itself, was just weak in that the T12 vertebral body is a heavy weight bearing area.  We will call radiology and see if they cannot get the appointment taking care of. I actually spoke with Dr. Estanislado Pandy of radiology. I gave him her name and birth date. He will set her up for an appointment        [name], MD 2/29/20161:52 PM

## 2014-07-08 NOTE — Patient Instructions (Signed)
Bortezomib injection What is this medicine? BORTEZOMIB (bor TEZ oh mib) is a chemotherapy drug. It slows the growth of cancer cells. This medicine is used to treat multiple myeloma, and certain lymphomas, such as mantle-cell lymphoma. This medicine may be used for other purposes; ask your health care provider or pharmacist if you have questions. COMMON BRAND NAME(S): Velcade What should I tell my health care provider before I take this medicine? They need to know if you have any of these conditions: -diabetes -heart disease -irregular heartbeat -liver disease -on hemodialysis -low blood counts, like low white blood cells, platelets, or hemoglobin -peripheral neuropathy -taking medicine for blood pressure -an unusual or allergic reaction to bortezomib, mannitol, boron, other medicines, foods, dyes, or preservatives -pregnant or trying to get pregnant -breast-feeding How should I use this medicine? This medicine is for injection into a vein or for injection under the skin. It is given by a health care professional in a hospital or clinic setting. Talk to your pediatrician regarding the use of this medicine in children. Special care may be needed. Overdosage: If you think you have taken too much of this medicine contact a poison control center or emergency room at once. NOTE: This medicine is only for you. Do not share this medicine with others. What if I miss a dose? It is important not to miss your dose. Call your doctor or health care professional if you are unable to keep an appointment. What may interact with this medicine? This medicine may interact with the following medications: -ketoconazole -rifampin -ritonavir -St. John's Wort This list may not describe all possible interactions. Give your health care provider a list of all the medicines, herbs, non-prescription drugs, or dietary supplements you use. Also tell them if you smoke, drink alcohol, or use illegal drugs. Some items  may interact with your medicine. What should I watch for while using this medicine? Visit your doctor for checks on your progress. This drug may make you feel generally unwell. This is not uncommon, as chemotherapy can affect healthy cells as well as cancer cells. Report any side effects. Continue your course of treatment even though you feel ill unless your doctor tells you to stop. You may get drowsy or dizzy. Do not drive, use machinery, or do anything that needs mental alertness until you know how this medicine affects you. Do not stand or sit up quickly, especially if you are an older patient. This reduces the risk of dizzy or fainting spells. In some cases, you may be given additional medicines to help with side effects. Follow all directions for their use. Call your doctor or health care professional for advice if you get a fever, chills or sore throat, or other symptoms of a cold or flu. Do not treat yourself. This drug decreases your body's ability to fight infections. Try to avoid being around people who are sick. This medicine may increase your risk to bruise or bleed. Call your doctor or health care professional if you notice any unusual bleeding. You may need blood work done while you are taking this medicine. In some patients, this medicine may cause a serious brain infection that may cause death. If you have any problems seeing, thinking, speaking, walking, or standing, tell your doctor right away. If you cannot reach your doctor, urgently seek other source of medical care. Do not become pregnant while taking this medicine. Women should inform their doctor if they wish to become pregnant or think they might be pregnant. There is   a potential for serious side effects to an unborn child. Talk to your health care professional or pharmacist for more information. Do not breast-feed an infant while taking this medicine. Check with your doctor or health care professional if you get an attack of  severe diarrhea, nausea and vomiting, or if you sweat a lot. The loss of too much body fluid can make it dangerous for you to take this medicine. What side effects may I notice from receiving this medicine? Side effects that you should report to your doctor or health care professional as soon as possible: -allergic reactions like skin rash, itching or hives, swelling of the face, lips, or tongue -breathing problems -changes in hearing -changes in vision -fast, irregular heartbeat -feeling faint or lightheaded, falls -pain, tingling, numbness in the hands or feet -right upper belly pain -seizures -swelling of the ankles, feet, hands -unusual bleeding or bruising -unusually weak or tired -vomiting -yellowing of the eyes or skin Side effects that usually do not require medical attention (report to your doctor or health care professional if they continue or are bothersome): -changes in emotions or moods -constipation -diarrhea -loss of appetite -headache -irritation at site where injected -nausea This list may not describe all possible side effects. Call your doctor for medical advice about side effects. You may report side effects to FDA at 1-800-FDA-1088. Where should I keep my medicine? This drug is given in a hospital or clinic and will not be stored at home. NOTE: This sheet is a summary. It may not cover all possible information. If you have questions about this medicine, talk to your doctor, pharmacist, or health care provider.  2015, [employer] Standard. (2013-02-19 12:46:32)   

## 2014-07-09 ENCOUNTER — Other Ambulatory Visit (HOSPITAL_COMMUNITY): Payer: Self-pay | Admitting: Interventional Radiology

## 2014-07-09 DIAGNOSIS — C9000 Multiple myeloma not having achieved remission: Secondary | ICD-10-CM

## 2014-07-11 ENCOUNTER — Telehealth: Payer: Self-pay | Admitting: *Deleted

## 2014-07-11 LAB — PROTEIN ELECTROPHORESIS, SERUM, WITH REFLEX
ALBUMIN ELP: 50 % — AB (ref 55.8–66.1)
ALPHA-1-GLOBULIN: 4.9 % (ref 2.9–4.9)
Alpha-2-Globulin: 9.1 % (ref 7.1–11.8)
Beta 2: 4.1 % (ref 3.2–6.5)
Beta Globulin: 4.7 % (ref 4.7–7.2)
Gamma Globulin: 27.2 % — ABNORMAL HIGH (ref 11.1–18.8)
M-Spike, %: 1.39 g/dL
Total Protein, Serum Electrophoresis: 7.4 g/dL (ref 6.0–8.3)

## 2014-07-11 LAB — IGG, IGA, IGM
IGG (IMMUNOGLOBIN G), SERUM: 2420 mg/dL — AB (ref 690–1700)
IgA: 39 mg/dL — ABNORMAL LOW (ref 69–380)
IgM, Serum: 18 mg/dL — ABNORMAL LOW (ref 52–322)

## 2014-07-11 LAB — KAPPA/LAMBDA LIGHT CHAINS
Kappa free light chain: 5.94 mg/dL — ABNORMAL HIGH (ref 0.33–1.94)
Kappa:Lambda Ratio: 2.86 — ABNORMAL HIGH (ref 0.26–1.65)
Lambda Free Lght Chn: 2.08 mg/dL (ref 0.57–2.63)

## 2014-07-11 LAB — IFE INTERPRETATION

## 2014-07-11 NOTE — Telephone Encounter (Signed)
myeloma level is down by another 45%!!! Keep on truckin!!!  called to patient

## 2014-07-12 ENCOUNTER — Other Ambulatory Visit: Payer: Self-pay | Admitting: *Deleted

## 2014-07-12 ENCOUNTER — Ambulatory Visit (HOSPITAL_BASED_OUTPATIENT_CLINIC_OR_DEPARTMENT_OTHER): Payer: 59

## 2014-07-12 DIAGNOSIS — C9000 Multiple myeloma not having achieved remission: Secondary | ICD-10-CM

## 2014-07-12 MED ORDER — ZOLEDRONIC ACID 4 MG/100ML IV SOLN
4.0000 mg | Freq: Once | INTRAVENOUS | Status: AC
  Administered 2014-07-12: 4 mg via INTRAVENOUS
  Filled 2014-07-12: qty 100

## 2014-07-12 MED ORDER — SODIUM CHLORIDE 0.9 % IV SOLN
Freq: Once | INTRAVENOUS | Status: AC
  Administered 2014-07-12: 10:00:00 via INTRAVENOUS

## 2014-07-12 NOTE — Patient Instructions (Signed)

## 2014-07-15 ENCOUNTER — Other Ambulatory Visit (HOSPITAL_BASED_OUTPATIENT_CLINIC_OR_DEPARTMENT_OTHER): Payer: 59 | Admitting: Lab

## 2014-07-15 ENCOUNTER — Ambulatory Visit (HOSPITAL_BASED_OUTPATIENT_CLINIC_OR_DEPARTMENT_OTHER): Payer: 59

## 2014-07-15 DIAGNOSIS — Z5112 Encounter for antineoplastic immunotherapy: Secondary | ICD-10-CM

## 2014-07-15 DIAGNOSIS — C9000 Multiple myeloma not having achieved remission: Secondary | ICD-10-CM

## 2014-07-15 LAB — CMP (CANCER CENTER ONLY)
ALBUMIN: 3.2 g/dL — AB (ref 3.3–5.5)
ALT(SGPT): 40 U/L (ref 10–47)
AST: 28 U/L (ref 11–38)
Alkaline Phosphatase: 41 U/L (ref 26–84)
BUN: 9 mg/dL (ref 7–22)
CHLORIDE: 101 meq/L (ref 98–108)
CO2: 31 meq/L (ref 18–33)
Calcium: 8.1 mg/dL (ref 8.0–10.3)
Creat: 0.5 mg/dl — ABNORMAL LOW (ref 0.6–1.2)
GLUCOSE: 99 mg/dL (ref 73–118)
Potassium: 3.7 mEq/L (ref 3.3–4.7)
Sodium: 142 mEq/L (ref 128–145)
Total Bilirubin: 0.7 mg/dl (ref 0.20–1.60)
Total Protein: 7.6 g/dL (ref 6.4–8.1)

## 2014-07-15 LAB — CBC WITH DIFFERENTIAL (CANCER CENTER ONLY)
BASO#: 0 10*3/uL (ref 0.0–0.2)
BASO%: 0.3 % (ref 0.0–2.0)
EOS ABS: 0 10*3/uL (ref 0.0–0.5)
EOS%: 1 % (ref 0.0–7.0)
HEMATOCRIT: 28.3 % — AB (ref 34.8–46.6)
HGB: 9.1 g/dL — ABNORMAL LOW (ref 11.6–15.9)
LYMPH#: 1.1 10*3/uL (ref 0.9–3.3)
LYMPH%: 28.7 % (ref 14.0–48.0)
MCH: 29.3 pg (ref 26.0–34.0)
MCHC: 32.2 g/dL (ref 32.0–36.0)
MCV: 91 fL (ref 81–101)
MONO#: 0.6 10*3/uL (ref 0.1–0.9)
MONO%: 14.4 % — ABNORMAL HIGH (ref 0.0–13.0)
NEUT%: 55.6 % (ref 39.6–80.0)
NEUTROS ABS: 2.1 10*3/uL (ref 1.5–6.5)
Platelets: 130 10*3/uL — ABNORMAL LOW (ref 145–400)
RBC: 3.11 10*6/uL — ABNORMAL LOW (ref 3.70–5.32)
RDW: 15.6 % (ref 11.1–15.7)
WBC: 3.8 10*3/uL — ABNORMAL LOW (ref 3.9–10.0)

## 2014-07-15 MED ORDER — ONDANSETRON HCL 8 MG PO TABS
ORAL_TABLET | ORAL | Status: AC
  Filled 2014-07-15: qty 1

## 2014-07-15 MED ORDER — BORTEZOMIB CHEMO SQ INJECTION 3.5 MG (2.5MG/ML)
1.3000 mg/m2 | Freq: Once | INTRAMUSCULAR | Status: AC
  Administered 2014-07-15: 2.75 mg via SUBCUTANEOUS
  Filled 2014-07-15: qty 2.75

## 2014-07-15 MED ORDER — ONDANSETRON HCL 8 MG PO TABS
8.0000 mg | ORAL_TABLET | Freq: Once | ORAL | Status: AC
  Administered 2014-07-15: 8 mg via ORAL

## 2014-07-15 NOTE — Patient Instructions (Signed)
Bortezomib injection What is this medicine? BORTEZOMIB (bor TEZ oh mib) is a chemotherapy drug. It slows the growth of cancer cells. This medicine is used to treat multiple myeloma, and certain lymphomas, such as mantle-cell lymphoma. This medicine may be used for other purposes; ask your health care provider or pharmacist if you have questions. COMMON BRAND NAME(S): Velcade What should I tell my health care provider before I take this medicine? They need to know if you have any of these conditions: -diabetes -heart disease -irregular heartbeat -liver disease -on hemodialysis -low blood counts, like low white blood cells, platelets, or hemoglobin -peripheral neuropathy -taking medicine for blood pressure -an unusual or allergic reaction to bortezomib, mannitol, boron, other medicines, foods, dyes, or preservatives -pregnant or trying to get pregnant -breast-feeding How should I use this medicine? This medicine is for injection into a vein or for injection under the skin. It is given by a health care professional in a hospital or clinic setting. Talk to your pediatrician regarding the use of this medicine in children. Special care may be needed. Overdosage: If you think you have taken too much of this medicine contact a poison control center or emergency room at once. NOTE: This medicine is only for you. Do not share this medicine with others. What if I miss a dose? It is important not to miss your dose. Call your doctor or health care professional if you are unable to keep an appointment. What may interact with this medicine? This medicine may interact with the following medications: -ketoconazole -rifampin -ritonavir -St. John's Wort This list may not describe all possible interactions. Give your health care provider a list of all the medicines, herbs, non-prescription drugs, or dietary supplements you use. Also tell them if you smoke, drink alcohol, or use illegal drugs. Some items  may interact with your medicine. What should I watch for while using this medicine? Visit your doctor for checks on your progress. This drug may make you feel generally unwell. This is not uncommon, as chemotherapy can affect healthy cells as well as cancer cells. Report any side effects. Continue your course of treatment even though you feel ill unless your doctor tells you to stop. You may get drowsy or dizzy. Do not drive, use machinery, or do anything that needs mental alertness until you know how this medicine affects you. Do not stand or sit up quickly, especially if you are an older patient. This reduces the risk of dizzy or fainting spells. In some cases, you may be given additional medicines to help with side effects. Follow all directions for their use. Call your doctor or health care professional for advice if you get a fever, chills or sore throat, or other symptoms of a cold or flu. Do not treat yourself. This drug decreases your body's ability to fight infections. Try to avoid being around people who are sick. This medicine may increase your risk to bruise or bleed. Call your doctor or health care professional if you notice any unusual bleeding. You may need blood work done while you are taking this medicine. In some patients, this medicine may cause a serious brain infection that may cause death. If you have any problems seeing, thinking, speaking, walking, or standing, tell your doctor right away. If you cannot reach your doctor, urgently seek other source of medical care. Do not become pregnant while taking this medicine. Women should inform their doctor if they wish to become pregnant or think they might be pregnant. There is   a potential for serious side effects to an unborn child. Talk to your health care professional or pharmacist for more information. Do not breast-feed an infant while taking this medicine. Check with your doctor or health care professional if you get an attack of  severe diarrhea, nausea and vomiting, or if you sweat a lot. The loss of too much body fluid can make it dangerous for you to take this medicine. What side effects may I notice from receiving this medicine? Side effects that you should report to your doctor or health care professional as soon as possible: -allergic reactions like skin rash, itching or hives, swelling of the face, lips, or tongue -breathing problems -changes in hearing -changes in vision -fast, irregular heartbeat -feeling faint or lightheaded, falls -pain, tingling, numbness in the hands or feet -right upper belly pain -seizures -swelling of the ankles, feet, hands -unusual bleeding or bruising -unusually weak or tired -vomiting -yellowing of the eyes or skin Side effects that usually do not require medical attention (report to your doctor or health care professional if they continue or are bothersome): -changes in emotions or moods -constipation -diarrhea -loss of appetite -headache -irritation at site where injected -nausea This list may not describe all possible side effects. Call your doctor for medical advice about side effects. You may report side effects to FDA at 1-800-FDA-1088. Where should I keep my medicine? This drug is given in a hospital or clinic and will not be stored at home. NOTE: This sheet is a summary. It may not cover all possible information. If you have questions about this medicine, talk to your doctor, pharmacist, or health care provider.  2015, [employer] Standard. (2013-02-19 12:46:32)   

## 2014-07-17 ENCOUNTER — Ambulatory Visit (HOSPITAL_COMMUNITY)
Admission: RE | Admit: 2014-07-17 | Discharge: 2014-07-17 | Disposition: A | Discharge status: Home / Self Care | Payer: 59 | Source: Ambulatory Visit | Attending: Interventional Radiology | Admitting: Interventional Radiology

## 2014-07-17 DIAGNOSIS — C9000 Multiple myeloma not having achieved remission: Secondary | ICD-10-CM

## 2014-07-19 ENCOUNTER — Other Ambulatory Visit (HOSPITAL_COMMUNITY): Payer: Self-pay | Admitting: Interventional Radiology

## 2014-07-19 ENCOUNTER — Telehealth (HOSPITAL_COMMUNITY): Payer: Self-pay | Admitting: Interventional Radiology

## 2014-07-19 DIAGNOSIS — M549 Dorsalgia, unspecified: Secondary | ICD-10-CM

## 2014-07-19 DIAGNOSIS — C9000 Multiple myeloma not having achieved remission: Secondary | ICD-10-CM

## 2014-07-19 NOTE — Telephone Encounter (Signed)
Called pt to schedule T12 ablation. Patient states she will call me back when she is ready to schedule the procedure. JM

## 2014-07-26 ENCOUNTER — Other Ambulatory Visit: Payer: Self-pay | Admitting: *Deleted

## 2014-07-26 DIAGNOSIS — C9000 Multiple myeloma not having achieved remission: Secondary | ICD-10-CM

## 2014-07-29 ENCOUNTER — Encounter: Payer: Self-pay | Admitting: Hematology & Oncology

## 2014-07-29 ENCOUNTER — Other Ambulatory Visit (HOSPITAL_BASED_OUTPATIENT_CLINIC_OR_DEPARTMENT_OTHER): Payer: 59 | Admitting: Lab

## 2014-07-29 ENCOUNTER — Ambulatory Visit (HOSPITAL_BASED_OUTPATIENT_CLINIC_OR_DEPARTMENT_OTHER): Payer: 59 | Admitting: Hematology & Oncology

## 2014-07-29 ENCOUNTER — Other Ambulatory Visit: Payer: Self-pay | Admitting: *Deleted

## 2014-07-29 ENCOUNTER — Telehealth: Payer: Self-pay | Admitting: Hematology & Oncology

## 2014-07-29 ENCOUNTER — Ambulatory Visit (HOSPITAL_BASED_OUTPATIENT_CLINIC_OR_DEPARTMENT_OTHER): Payer: 59

## 2014-07-29 VITALS — BP 137/56 | HR 85 | Temp 97.9°F | Resp 14 | Ht 64.0 in | Wt 187.0 lb

## 2014-07-29 DIAGNOSIS — C9000 Multiple myeloma not having achieved remission: Secondary | ICD-10-CM

## 2014-07-29 DIAGNOSIS — Z5112 Encounter for antineoplastic immunotherapy: Secondary | ICD-10-CM

## 2014-07-29 LAB — CBC WITH DIFFERENTIAL (CANCER CENTER ONLY)
BASO#: 0 10*3/uL (ref 0.0–0.2)
BASO%: 0.6 % (ref 0.0–2.0)
EOS%: 6 % (ref 0.0–7.0)
Eosinophils Absolute: 0.2 10*3/uL (ref 0.0–0.5)
HCT: 27.8 % — ABNORMAL LOW (ref 34.8–46.6)
HGB: 9.2 g/dL — ABNORMAL LOW (ref 11.6–15.9)
LYMPH#: 1.3 10*3/uL (ref 0.9–3.3)
LYMPH%: 36.4 % (ref 14.0–48.0)
MCH: 29.7 pg (ref 26.0–34.0)
MCHC: 33.1 g/dL (ref 32.0–36.0)
MCV: 90 fL (ref 81–101)
MONO#: 0.5 10*3/uL (ref 0.1–0.9)
MONO%: 14.3 % — ABNORMAL HIGH (ref 0.0–13.0)
NEUT#: 1.5 10*3/uL (ref 1.5–6.5)
NEUT%: 42.7 % (ref 39.6–80.0)
PLATELETS: 131 10*3/uL — AB (ref 145–400)
RBC: 3.1 10*6/uL — ABNORMAL LOW (ref 3.70–5.32)
RDW: 15.7 % (ref 11.1–15.7)
WBC: 3.5 10*3/uL — ABNORMAL LOW (ref 3.9–10.0)

## 2014-07-29 LAB — CMP (CANCER CENTER ONLY)
ALBUMIN: 3.3 g/dL (ref 3.3–5.5)
ALT(SGPT): 36 U/L (ref 10–47)
AST: 27 U/L (ref 11–38)
Alkaline Phosphatase: 43 U/L (ref 26–84)
BUN: 15 mg/dL (ref 7–22)
CALCIUM: 9 mg/dL (ref 8.0–10.3)
CO2: 31 meq/L (ref 18–33)
Chloride: 103 mEq/L (ref 98–108)
Creat: 0.9 mg/dl (ref 0.6–1.2)
Glucose, Bld: 98 mg/dL (ref 73–118)
POTASSIUM: 3.7 meq/L (ref 3.3–4.7)
Sodium: 143 mEq/L (ref 128–145)
Total Bilirubin: 0.8 mg/dl (ref 0.20–1.60)
Total Protein: 7.6 g/dL (ref 6.4–8.1)

## 2014-07-29 MED ORDER — PB-HYOSCY-ATROPINE-SCOPOLAMINE 16.2 MG PO TABS: ORAL_TABLET | ORAL | Status: DC

## 2014-07-29 MED ORDER — ONDANSETRON HCL 8 MG PO TABS
8.0000 mg | ORAL_TABLET | Freq: Once | ORAL | Status: AC
  Administered 2014-07-29: 8 mg via ORAL

## 2014-07-29 MED ORDER — BORTEZOMIB CHEMO SQ INJECTION 3.5 MG (2.5MG/ML)
1.3000 mg/m2 | Freq: Once | INTRAMUSCULAR | Status: AC
  Administered 2014-07-29: 2.75 mg via SUBCUTANEOUS
  Filled 2014-07-29: qty 2.75

## 2014-07-29 MED ORDER — ONDANSETRON HCL 8 MG PO TABS
ORAL_TABLET | ORAL | Status: AC
  Filled 2014-07-29: qty 1

## 2014-07-29 NOTE — Telephone Encounter (Signed)
Dos: 07/12/2014 U0370  -  Zoledronic Acid 4 MG/100ML Soln 100 mL Plas Cont - NPR (supportive drug)

## 2014-07-29 NOTE — Progress Notes (Signed)
Hematology and Oncology Follow Up Visit  Barbara Cook [id number] 05/02/1956 59 y.o. 07/29/2014   Principle Diagnosis:  IgG kappa myeloma   Current Therapy:     Velcade/Revlimid/Decadron   Zometa 4 mg IV every month     Interim History:  Ms.  Cook is back for a follow-up. She feels a little better. She has seen intervention radiology for the low back fracture at T12. They will do a kyphoplasty on Barbara Cook on April 8.  Barbara Cook monoclonal studies are improving. Barbara Cook M spike is now down to 1.39 g/dL. Barbara Cook IgG level is 2420 milligrams per deciliter. Barbara Cook Kappa Light chain is 5.9 mg/dL.  She has been having some diarrhea with the Velcade. I will try some Donnatal for this.  She is on Zometa so I think she is doing okay with this.  She's had no problems with fever. There's been no rashes. She's had no leg swelling. She's had no bleeding. She's had no cough. There's been no shortness of breath. She's had no mouth sores.  Currently, Barbara Cook performance status is ECOG 1    Medications:  Current outpatient prescriptions:  .  acetaminophen (TYLENOL) 650 MG CR tablet, Take 1,300 mg by mouth every 8 (eight) hours as needed for pain. , Disp: , Rfl:  .  aspirin 325 MG tablet, Take 325 mg by mouth daily., Disp: , Rfl:  .  bortezomib IV (VELCADE) 3.5 MG injection, Inject 2.75 mg into the vein every 7 (seven) days., Disp: , Rfl:  .  Cholecalciferol (VITAMIN D) 1000 UNITS capsule, Take 2,000 Units by mouth 2 (two) times daily. 2 IN AM AND 2 IN PM, Disp: , Rfl:  .  dexamethasone (DECADRON) 4 MG tablet, Take 5 tablets once a week with food., Disp: 80 tablet, Rfl: 4 .  hyaluronate sodium (RADIAPLEXRX) GEL, Apply 1 application topically once., Disp: , Rfl:  .  lenalidomide (REVLIMID) 25 MG capsule, Take 1 capsule at bedtime daily for 21 days and then off for 7 days. [id number], Disp: 21 capsule, Rfl: 6 .  Magnesium 250 MG TABS, Take 2 tablets by mouth daily. Takes 500 mg daily, Disp: , Rfl:  .  Multiple  Vitamin (MULTIVITAMIN) tablet, Take 1 tablet by mouth every evening. , Disp: , Rfl:  .  ondansetron (ZOFRAN) 8 MG tablet, Take 76m twice a day. Start the day after chemo for 2 days. Then take for nausea and vomiting twice a day as needed., Disp: 30 tablet, Rfl: 3 .  pantoprazole (PROTONIX) 40 MG tablet, Take 1 tablet (40 mg total) by mouth 2 (two) times daily., Disp: 60 tablet, Rfl: 6 .  senna-docusate (SENOKOT-S) 8.6-50 MG per tablet, Take 4 tablets by mouth 2 (two) times daily., Disp: , Rfl:  .  sucralfate (CARAFATE) 1 GM/10ML suspension, Take 10 mLs (1 g total) by mouth 4 (four) times daily -  with meals and at bedtime., Disp: 420 mL, Rfl: 0 .  torsemide (DEMADEX) 20 MG tablet, Take 1 tablet (20 mg total) by mouth daily as needed., Disp: 30 tablet, Rfl: 6 .  [place] alk-PHENObarbital (DONNATAL) 16.2 MG tablet, Take 1-2 pills, IF NEEDED, for abdominal spasms., Disp: 60 tablet, Rfl: 1 .  loperamide (IMODIUM) 2 MG capsule, Take by mouth as needed for diarrhea or loose stools., Disp: , Rfl:  .  METHYLCOBALAMIN PO, Take 1,000 mcg by mouth daily., Disp: , Rfl:  .  prochlorperazine (COMPAZINE) 10 MG tablet, Take 1 tablet (10 mg total) by mouth every 6 (six) hours as  needed for nausea or vomiting. (Patient not taking: Reported on 10-19-2014), Disp: 30 tablet, Rfl: 0  Allergies:  Allergies  Allergen Reactions  . Codeine Palpitations    Past Medical History, Surgical history, Social history, and Family History were reviewed and updated.  Review of Systems: As above  Physical Exam:  height is 5' 4"  (1.626 m) and weight is 187 lb (84.823 kg). Barbara Cook oral temperature is 97.9 F (36.6 C). Barbara Cook blood pressure is 137/56 and Barbara Cook pulse is 85. Barbara Cook respiration is 14.   Well-developed and well-nourished African American female. Head and neck exam shows no ocular or oral lesions. There are no palpable cervical or supraclavicular lymph nodes. Lungs are clear. Cardiac exam regular rate and rhythm with no murmurs,  rubs or bruits. Abdomen is soft. She is mildly obese. She is no palpable liver or spleen tip. Back exam shows no tenderness over the spine, ribs or hips. Extremity shows no clubbing, cyanosis or edema. Skin exam shows no rashes, ecchymoses or petechia. Neurological exam is nonfocal.  Lab Results  Component Value Date   WBC 3.5* 07/29/2014   HGB 9.2* 07/29/2014   HCT 27.8* 07/29/2014   MCV 90 07/29/2014   PLT 131* 07/29/2014     Chemistry      Component Value Date/Time   NA 143 07/29/2014 1208   NA 133* 06/03/2014 0122   K 3.7 07/29/2014 1208   K 3.9 06/03/2014 0122   CL 103 07/29/2014 1208   CL 104 06/03/2014 0122   CO2 31 07/29/2014 1208   CO2 24 06/03/2014 0122   BUN 15 07/29/2014 1208   BUN 12 06/03/2014 0122   CREATININE 0.9 07/29/2014 1208   CREATININE 0.82 06/03/2014 0122      Component Value Date/Time   CALCIUM 9.0 07/29/2014 1208   CALCIUM 7.8* 06/03/2014 0122   ALKPHOS 43 07/29/2014 1208   ALKPHOS 47 06/03/2014 0122   AST 27 07/29/2014 1208   AST 40* 06/03/2014 0122   ALT 36 07/29/2014 1208   ALT 43* 06/03/2014 0122   BILITOT 0.80 07/29/2014 1208   BILITOT 0.5 06/03/2014 0122         Impression and Plan: Barbara Cook is 59 year old African-American female with IgG kappa myeloma.   She really is doing quite well. Barbara Cook M- spike keeps coming down slowly but surely.  I think that after Barbara Cook kyphoplasty, she really should do well with respect to back pain.  Hopefully, Donnatal will help with the abdominal spasms that she has with the Velcade. This is a unusual side effect but not unheard of. I told Barbara Cook to take 1 or 2 Donnatal every 8 hours if necessary.  We will plan to get Barbara Cook back in one more month. Hopefully, we will continue to see that Barbara Cook M spike decreases. If it does continue to decrease, then at some point, we might be able to pull back and only have Barbara Cook on Revlimid.       [name], MD 3/21/20161:39 PM

## 2014-07-29 NOTE — Patient Instructions (Signed)
Bortezomib injection What is this medicine? BORTEZOMIB (bor TEZ oh mib) is a chemotherapy drug. It slows the growth of cancer cells. This medicine is used to treat multiple myeloma, and certain lymphomas, such as mantle-cell lymphoma. This medicine may be used for other purposes; ask your health care provider or pharmacist if you have questions. COMMON BRAND NAME(S): Velcade What should I tell my health care provider before I take this medicine? They need to know if you have any of these conditions: -diabetes -heart disease -irregular heartbeat -liver disease -on hemodialysis -low blood counts, like low white blood cells, platelets, or hemoglobin -peripheral neuropathy -taking medicine for blood pressure -an unusual or allergic reaction to bortezomib, mannitol, boron, other medicines, foods, dyes, or preservatives -pregnant or trying to get pregnant -breast-feeding How should I use this medicine? This medicine is for injection into a vein or for injection under the skin. It is given by a health care professional in a hospital or clinic setting. Talk to your pediatrician regarding the use of this medicine in children. Special care may be needed. Overdosage: If you think you have taken too much of this medicine contact a poison control center or emergency room at once. NOTE: This medicine is only for you. Do not share this medicine with others. What if I miss a dose? It is important not to miss your dose. Call your doctor or health care professional if you are unable to keep an appointment. What may interact with this medicine? This medicine may interact with the following medications: -ketoconazole -rifampin -ritonavir -St. John's Wort This list may not describe all possible interactions. Give your health care provider a list of all the medicines, herbs, non-prescription drugs, or dietary supplements you use. Also tell them if you smoke, drink alcohol, or use illegal drugs. Some items  may interact with your medicine. What should I watch for while using this medicine? Visit your doctor for checks on your progress. This drug may make you feel generally unwell. This is not uncommon, as chemotherapy can affect healthy cells as well as cancer cells. Report any side effects. Continue your course of treatment even though you feel ill unless your doctor tells you to stop. You may get drowsy or dizzy. Do not drive, use machinery, or do anything that needs mental alertness until you know how this medicine affects you. Do not stand or sit up quickly, especially if you are an older patient. This reduces the risk of dizzy or fainting spells. In some cases, you may be given additional medicines to help with side effects. Follow all directions for their use. Call your doctor or health care professional for advice if you get a fever, chills or sore throat, or other symptoms of a cold or flu. Do not treat yourself. This drug decreases your body's ability to fight infections. Try to avoid being around people who are sick. This medicine may increase your risk to bruise or bleed. Call your doctor or health care professional if you notice any unusual bleeding. You may need blood work done while you are taking this medicine. In some patients, this medicine may cause a serious brain infection that may cause death. If you have any problems seeing, thinking, speaking, walking, or standing, tell your doctor right away. If you cannot reach your doctor, urgently seek other source of medical care. Do not become pregnant while taking this medicine. Women should inform their doctor if they wish to become pregnant or think they might be pregnant. There is   a potential for serious side effects to an unborn child. Talk to your health care professional or pharmacist for more information. Do not breast-feed an infant while taking this medicine. Check with your doctor or health care professional if you get an attack of  severe diarrhea, nausea and vomiting, or if you sweat a lot. The loss of too much body fluid can make it dangerous for you to take this medicine. What side effects may I notice from receiving this medicine? Side effects that you should report to your doctor or health care professional as soon as possible: -allergic reactions like skin rash, itching or hives, swelling of the face, lips, or tongue -breathing problems -changes in hearing -changes in vision -fast, irregular heartbeat -feeling faint or lightheaded, falls -pain, tingling, numbness in the hands or feet -right upper belly pain -seizures -swelling of the ankles, feet, hands -unusual bleeding or bruising -unusually weak or tired -vomiting -yellowing of the eyes or skin Side effects that usually do not require medical attention (report to your doctor or health care professional if they continue or are bothersome): -changes in emotions or moods -constipation -diarrhea -loss of appetite -headache -irritation at site where injected -nausea This list may not describe all possible side effects. Call your doctor for medical advice about side effects. You may report side effects to FDA at 1-800-FDA-1088. Where should I keep my medicine? This drug is given in a hospital or clinic and will not be stored at home. NOTE: This sheet is a summary. It may not cover all possible information. If you have questions about this medicine, talk to your doctor, pharmacist, or health care provider.  2015, Elsevier/Gold Standard. (2013-02-19 12:46:32)  

## 2014-07-31 ENCOUNTER — Other Ambulatory Visit: Payer: Self-pay | Admitting: *Deleted

## 2014-07-31 DIAGNOSIS — C9000 Multiple myeloma not having achieved remission: Secondary | ICD-10-CM

## 2014-07-31 LAB — PROTEIN ELECTROPHORESIS, SERUM
Abnormal Protein Band1: 1.1 g/dL
Albumin ELP: 3.8 g/dL (ref 3.8–4.8)
Alpha-1-Globulin: 0.3 g/dL (ref 0.2–0.3)
Alpha-2-Globulin: 0.7 g/dL (ref 0.5–0.9)
BETA 2: 0.3 g/dL (ref 0.2–0.5)
Beta Globulin: 0.4 g/dL (ref 0.4–0.6)
Gamma Globulin: 1.7 g/dL (ref 0.8–1.7)
Total Protein, Serum Electrophoresis: 7.1 g/dL (ref 6.1–8.1)

## 2014-07-31 LAB — KAPPA/LAMBDA LIGHT CHAINS
KAPPA LAMBDA RATIO: 2.8 — AB (ref 0.26–1.65)
Kappa free light chain: 5.68 mg/dL — ABNORMAL HIGH (ref 0.33–1.94)
Lambda Free Lght Chn: 2.03 mg/dL (ref 0.57–2.63)

## 2014-07-31 LAB — IGG, IGA, IGM
IGA: 29 mg/dL — AB (ref 69–380)
IGG (IMMUNOGLOBIN G), SERUM: 1890 mg/dL — AB (ref 690–1700)
IgM, Serum: 16 mg/dL — ABNORMAL LOW (ref 52–322)

## 2014-07-31 LAB — IFE INTERPRETATION

## 2014-07-31 MED ORDER — LENALIDOMIDE 25 MG PO CAPS: ORAL_CAPSULE | ORAL | Status: DC

## 2014-08-05 ENCOUNTER — Ambulatory Visit (HOSPITAL_BASED_OUTPATIENT_CLINIC_OR_DEPARTMENT_OTHER): Payer: 59

## 2014-08-05 ENCOUNTER — Telehealth: Payer: Self-pay | Admitting: Hematology & Oncology

## 2014-08-05 DIAGNOSIS — Z5112 Encounter for antineoplastic immunotherapy: Secondary | ICD-10-CM | POA: Diagnosis not present

## 2014-08-05 DIAGNOSIS — C9000 Multiple myeloma not having achieved remission: Secondary | ICD-10-CM

## 2014-08-05 LAB — CMP (CANCER CENTER ONLY)
ALT: 31 U/L (ref 10–47)
AST: 25 U/L (ref 11–38)
Albumin: 3.5 g/dL (ref 3.3–5.5)
Alkaline Phosphatase: 43 U/L (ref 26–84)
BILIRUBIN TOTAL: 0.8 mg/dL (ref 0.20–1.60)
BUN, Bld: 10 mg/dL (ref 7–22)
CO2: 31 mEq/L (ref 18–33)
CREATININE: 0.8 mg/dL (ref 0.6–1.2)
Calcium: 9.1 mg/dL (ref 8.0–10.3)
Chloride: 98 mEq/L (ref 98–108)
GLUCOSE: 95 mg/dL (ref 73–118)
Potassium: 3.6 mEq/L (ref 3.3–4.7)
Sodium: 135 mEq/L (ref 128–145)
Total Protein: 7.3 g/dL (ref 6.4–8.1)

## 2014-08-05 LAB — CBC WITH DIFFERENTIAL (CANCER CENTER ONLY)
BASO#: 0 10*3/uL (ref 0.0–0.2)
BASO%: 0.4 % (ref 0.0–2.0)
EOS%: 7.1 % — AB (ref 0.0–7.0)
Eosinophils Absolute: 0.2 10*3/uL (ref 0.0–0.5)
HCT: 28.8 % — ABNORMAL LOW (ref 34.8–46.6)
HGB: 9.6 g/dL — ABNORMAL LOW (ref 11.6–15.9)
LYMPH#: 1.2 10*3/uL (ref 0.9–3.3)
LYMPH%: 45.4 % (ref 14.0–48.0)
MCH: 29.7 pg (ref 26.0–34.0)
MCHC: 33.3 g/dL (ref 32.0–36.0)
MCV: 89 fL (ref 81–101)
MONO#: 0.5 10*3/uL (ref 0.1–0.9)
MONO%: 17.1 % — AB (ref 0.0–13.0)
NEUT#: 0.8 10*3/uL — ABNORMAL LOW (ref 1.5–6.5)
NEUT%: 30 % — ABNORMAL LOW (ref 39.6–80.0)
PLATELETS: 111 10*3/uL — AB (ref 145–400)
RBC: 3.23 10*6/uL — ABNORMAL LOW (ref 3.70–5.32)
RDW: 15.6 % (ref 11.1–15.7)
WBC: 2.7 10*3/uL — ABNORMAL LOW (ref 3.9–10.0)

## 2014-08-05 LAB — TECHNOLOGIST REVIEW CHCC SATELLITE

## 2014-08-05 MED ORDER — ONDANSETRON HCL 8 MG PO TABS
ORAL_TABLET | ORAL | Status: AC
  Filled 2014-08-05: qty 1

## 2014-08-05 MED ORDER — ONDANSETRON HCL 8 MG PO TABS
8.0000 mg | ORAL_TABLET | Freq: Once | ORAL | Status: AC
  Administered 2014-08-05: 8 mg via ORAL

## 2014-08-05 MED ORDER — BORTEZOMIB CHEMO SQ INJECTION 3.5 MG (2.5MG/ML)
1.3000 mg/m2 | Freq: Once | INTRAMUSCULAR | Status: AC
  Administered 2014-08-05: 2.75 mg via SUBCUTANEOUS
  Filled 2014-08-05: qty 2.75

## 2014-08-05 NOTE — Patient Instructions (Signed)
Bortezomib injection What is this medicine? BORTEZOMIB (bor TEZ oh mib) is a chemotherapy drug. It slows the growth of cancer cells. This medicine is used to treat multiple myeloma, and certain lymphomas, such as mantle-cell lymphoma. This medicine may be used for other purposes; ask your health care provider or pharmacist if you have questions. COMMON BRAND NAME(S): Velcade What should I tell my health care provider before I take this medicine? They need to know if you have any of these conditions: -diabetes -heart disease -irregular heartbeat -liver disease -on hemodialysis -low blood counts, like low white blood cells, platelets, or hemoglobin -peripheral neuropathy -taking medicine for blood pressure -an unusual or allergic reaction to bortezomib, mannitol, boron, other medicines, foods, dyes, or preservatives -pregnant or trying to get pregnant -breast-feeding How should I use this medicine? This medicine is for injection into a vein or for injection under the skin. It is given by a health care professional in a hospital or clinic setting. Talk to your pediatrician regarding the use of this medicine in children. Special care may be needed. Overdosage: If you think you have taken too much of this medicine contact a poison control center or emergency room at once. NOTE: This medicine is only for you. Do not share this medicine with others. What if I miss a dose? It is important not to miss your dose. Call your doctor or health care professional if you are unable to keep an appointment. What may interact with this medicine? This medicine may interact with the following medications: -ketoconazole -rifampin -ritonavir -St. John's Wort This list may not describe all possible interactions. Give your health care provider a list of all the medicines, herbs, non-prescription drugs, or dietary supplements you use. Also tell them if you smoke, drink alcohol, or use illegal drugs. Some items  may interact with your medicine. What should I watch for while using this medicine? Visit your doctor for checks on your progress. This drug may make you feel generally unwell. This is not uncommon, as chemotherapy can affect healthy cells as well as cancer cells. Report any side effects. Continue your course of treatment even though you feel ill unless your doctor tells you to stop. You may get drowsy or dizzy. Do not drive, use machinery, or do anything that needs mental alertness until you know how this medicine affects you. Do not stand or sit up quickly, especially if you are an older patient. This reduces the risk of dizzy or fainting spells. In some cases, you may be given additional medicines to help with side effects. Follow all directions for their use. Call your doctor or health care professional for advice if you get a fever, chills or sore throat, or other symptoms of a cold or flu. Do not treat yourself. This drug decreases your body's ability to fight infections. Try to avoid being around people who are sick. This medicine may increase your risk to bruise or bleed. Call your doctor or health care professional if you notice any unusual bleeding. You may need blood work done while you are taking this medicine. In some patients, this medicine may cause a serious brain infection that may cause death. If you have any problems seeing, thinking, speaking, walking, or standing, tell your doctor right away. If you cannot reach your doctor, urgently seek other source of medical care. Do not become pregnant while taking this medicine. Women should inform their doctor if they wish to become pregnant or think they might be pregnant. There is   a potential for serious side effects to an unborn child. Talk to your health care professional or pharmacist for more information. Do not breast-feed an infant while taking this medicine. Check with your doctor or health care professional if you get an attack of  severe diarrhea, nausea and vomiting, or if you sweat a lot. The loss of too much body fluid can make it dangerous for you to take this medicine. What side effects may I notice from receiving this medicine? Side effects that you should report to your doctor or health care professional as soon as possible: -allergic reactions like skin rash, itching or hives, swelling of the face, lips, or tongue -breathing problems -changes in hearing -changes in vision -fast, irregular heartbeat -feeling faint or lightheaded, falls -pain, tingling, numbness in the hands or feet -right upper belly pain -seizures -swelling of the ankles, feet, hands -unusual bleeding or bruising -unusually weak or tired -vomiting -yellowing of the eyes or skin Side effects that usually do not require medical attention (report to your doctor or health care professional if they continue or are bothersome): -changes in emotions or moods -constipation -diarrhea -loss of appetite -headache -irritation at site where injected -nausea This list may not describe all possible side effects. Call your doctor for medical advice about side effects. You may report side effects to FDA at 1-800-FDA-1088. Where should I keep my medicine? This drug is given in a hospital or clinic and will not be stored at home. NOTE: This sheet is a summary. It may not cover all possible information. If you have questions about this medicine, talk to your doctor, pharmacist, or health care provider.  2015, Elsevier/Gold Standard. (2013-02-19 12:46:32)  

## 2014-08-05 NOTE — Progress Notes (Signed)
Ok to treat despite labs per Dr. Marin Olp.

## 2014-08-05 NOTE — Telephone Encounter (Signed)
[place]  Auth: [id number] and [id number]  Valid: 05/20/2014-05/20/2015  Chemo: [id number] Velcade   NPR for Zometa

## 2014-08-07 LAB — KAPPA/LAMBDA LIGHT CHAINS
KAPPA LAMBDA RATIO: 3.36 — AB (ref 0.26–1.65)
Kappa free light chain: 4.9 mg/dL — ABNORMAL HIGH (ref 0.33–1.94)
Lambda Free Lght Chn: 1.46 mg/dL (ref 0.57–2.63)

## 2014-08-07 LAB — PROTEIN ELECTROPHORESIS, SERUM, WITH REFLEX
ABNORMAL PROTEIN BAND1: 1.1 g/dL
Albumin ELP: 3.5 g/dL — ABNORMAL LOW (ref 3.8–4.8)
Alpha-1-Globulin: 0.3 g/dL (ref 0.2–0.3)
Alpha-2-Globulin: 0.7 g/dL (ref 0.5–0.9)
BETA GLOBULIN: 0.3 g/dL — AB (ref 0.4–0.6)
Beta 2: 0.3 g/dL (ref 0.2–0.5)
Gamma Globulin: 1.7 g/dL (ref 0.8–1.7)
Total Protein, Serum Electrophoresis: 6.8 g/dL (ref 6.1–8.1)

## 2014-08-07 LAB — IGG, IGA, IGM
IGM, SERUM: 18 mg/dL — AB (ref 52–322)
IgA: 33 mg/dL — ABNORMAL LOW (ref 69–380)
IgG (Immunoglobin G), Serum: 1860 mg/dL — ABNORMAL HIGH (ref 690–1700)

## 2014-08-07 LAB — LACTATE DEHYDROGENASE: LDH: 183 U/L (ref 94–250)

## 2014-08-07 LAB — IFE INTERPRETATION

## 2014-08-07 LAB — BETA 2 MICROGLOBULIN, SERUM: BETA 2 MICROGLOBULIN: 2.91 mg/L — AB (ref <=2.51)

## 2014-08-08 ENCOUNTER — Telehealth: Payer: Self-pay | Admitting: Nurse Practitioner

## 2014-08-08 NOTE — Telephone Encounter (Addendum)
Patient verbalized understanding and appreciation.   ----- Message from [name], MD sent at 08/07/2014  5:02 PM EDT ----- Please call and tell her that the myeloma protein is now down to 1.1. This is fantastic news. She is still responding which I knew she would. Thanks

## 2014-08-14 ENCOUNTER — Other Ambulatory Visit: Payer: Self-pay | Admitting: Radiology

## 2014-08-14 NOTE — Progress Notes (Signed)
Called pt for pre-op call and she states she had talked to "[facility]" this morning and cancelled this procedure. It's to be rescheduled. Pt is still on OR schedule. Notified OR desk, spoke with Suanne Marker. Attempted to reach one of Dr. Arlean Hopping PA's, no one has returned my page.

## 2014-08-15 ENCOUNTER — Telehealth: Payer: Self-pay | Admitting: Hematology & Oncology

## 2014-08-15 ENCOUNTER — Encounter (HOSPITAL_COMMUNITY): Admission: RE | Payer: Self-pay | Source: Ambulatory Visit

## 2014-08-15 ENCOUNTER — Ambulatory Visit (HOSPITAL_COMMUNITY): Admission: RE | Admit: 2014-08-15 | Payer: 59 | Source: Ambulatory Visit | Admitting: Interventional Radiology

## 2014-08-15 ENCOUNTER — Ambulatory Visit (HOSPITAL_COMMUNITY): Admission: RE | Admit: 2014-08-15 | Payer: 59 | Source: Ambulatory Visit

## 2014-08-15 SURGERY — RADIOLOGY WITH ANESTHESIA
Anesthesia: General

## 2014-08-15 NOTE — Telephone Encounter (Signed)
Faxed Medical Records via fax todaytoAdin [facility]ector [facility] Ph: [phone] [id number] Fx: [phone]  Case: [id number]  Medical Records requested from 2015 to present   [place] SCANNED

## 2014-08-16 ENCOUNTER — Ambulatory Visit (HOSPITAL_COMMUNITY): Admission: RE | Admit: 2014-08-16 | Payer: 59 | Source: Ambulatory Visit

## 2014-08-20 ENCOUNTER — Other Ambulatory Visit: Payer: Self-pay | Admitting: Radiology

## 2014-08-21 ENCOUNTER — Telehealth: Payer: Self-pay | Admitting: *Deleted

## 2014-08-21 NOTE — Telephone Encounter (Signed)
On 08-21-14 fax medical records to disability determination services it was consult note, re-eval note, end of tx note, follow up note

## 2014-08-22 ENCOUNTER — Encounter (HOSPITAL_COMMUNITY): Payer: Self-pay

## 2014-08-22 ENCOUNTER — Encounter (HOSPITAL_COMMUNITY)
Admission: RE | Admit: 2014-08-22 | Discharge: 2014-08-22 | Disposition: A | Discharge status: Home / Self Care | Payer: 59 | Source: Ambulatory Visit | Attending: Interventional Radiology | Admitting: Interventional Radiology

## 2014-08-22 DIAGNOSIS — M1990 Unspecified osteoarthritis, unspecified site: Secondary | ICD-10-CM | POA: Diagnosis not present

## 2014-08-22 DIAGNOSIS — Z79899 Other long term (current) drug therapy: Secondary | ICD-10-CM | POA: Diagnosis not present

## 2014-08-22 DIAGNOSIS — Z87891 Personal history of nicotine dependence: Secondary | ICD-10-CM | POA: Diagnosis not present

## 2014-08-22 DIAGNOSIS — K219 Gastro-esophageal reflux disease without esophagitis: Secondary | ICD-10-CM | POA: Diagnosis not present

## 2014-08-22 DIAGNOSIS — C9000 Multiple myeloma not having achieved remission: Secondary | ICD-10-CM | POA: Diagnosis not present

## 2014-08-22 DIAGNOSIS — M8458XA Pathological fracture in neoplastic disease, other specified site, initial encounter for fracture: Secondary | ICD-10-CM | POA: Diagnosis not present

## 2014-08-22 DIAGNOSIS — M353 Polymyalgia rheumatica: Secondary | ICD-10-CM | POA: Diagnosis not present

## 2014-08-22 HISTORY — DX: Other complications of anesthesia, initial encounter: T88.59XA

## 2014-08-22 HISTORY — DX: Family history of other specified conditions: Z84.89

## 2014-08-22 HISTORY — DX: Adverse effect of unspecified anesthetic, initial encounter: T41.45XA

## 2014-08-22 HISTORY — DX: Other specified postprocedural states: R11.2

## 2014-08-22 HISTORY — DX: Constipation, unspecified: K59.00

## 2014-08-22 HISTORY — DX: Other specified postprocedural states: Z98.890

## 2014-08-22 LAB — CBC WITH DIFFERENTIAL/PLATELET
Basophils Absolute: 0 10*3/uL (ref 0.0–0.1)
Basophils Relative: 1 % (ref 0–1)
Eosinophils Absolute: 0.2 10*3/uL (ref 0.0–0.7)
Eosinophils Relative: 4 % (ref 0–5)
HCT: 30.1 % — ABNORMAL LOW (ref 36.0–46.0)
Hemoglobin: 9.8 g/dL — ABNORMAL LOW (ref 12.0–15.0)
Lymphocytes Relative: 33 % (ref 12–46)
Lymphs Abs: 1.3 10*3/uL (ref 0.7–4.0)
MCH: 29.3 pg (ref 26.0–34.0)
MCHC: 32.6 g/dL (ref 30.0–36.0)
MCV: 90.1 fL (ref 78.0–100.0)
Monocytes Absolute: 0.4 10*3/uL (ref 0.1–1.0)
Monocytes Relative: 11 % (ref 3–12)
NEUTROS ABS: 2 10*3/uL (ref 1.7–7.7)
NEUTROS PCT: 51 % (ref 43–77)
PLATELETS: 184 10*3/uL (ref 150–400)
RBC: 3.34 MIL/uL — ABNORMAL LOW (ref 3.87–5.11)
RDW: 16.7 % — AB (ref 11.5–15.5)
WBC: 3.9 10*3/uL — ABNORMAL LOW (ref 4.0–10.5)

## 2014-08-22 LAB — BASIC METABOLIC PANEL
Anion gap: 8 (ref 5–15)
BUN: 16 mg/dL (ref 6–23)
CO2: 30 mmol/L (ref 19–32)
CREATININE: 0.95 mg/dL (ref 0.50–1.10)
Calcium: 9.2 mg/dL (ref 8.4–10.5)
Chloride: 99 mmol/L (ref 96–112)
GFR calc non Af Amer: 64 mL/min — ABNORMAL LOW (ref >90)
GFR, EST AFRICAN AMERICAN: 75 mL/min — AB (ref >90)
Glucose, Bld: 88 mg/dL (ref 70–99)
Potassium: 3.8 mmol/L (ref 3.5–5.1)
Sodium: 137 mmol/L (ref 135–145)

## 2014-08-22 LAB — PROTIME-INR
INR: 1.03 (ref 0.00–1.49)
PROTHROMBIN TIME: 13.6 s (ref 11.6–15.2)

## 2014-08-22 NOTE — Pre-Procedure Instructions (Signed)
GODDESS GEBBIA  08/22/2014   Your procedure is scheduled on:  Friday 15.  Report to [facility] Admitting at 6:00 AM.  Call this number if you have problems the morning of surgery: [phone]   Remember:   Do not eat food or drink liquids after midnight.   Take these medicines the morning of surgery with A SIP OF WATER: pantoprazole (PROTONIX).             Tylenol if needed.                 Stop Aspirin, Vitamins and Herbal products.   Do not wear jewelry, make-up or nail polish.  Do not wear lotions, powders, or perfumes.   Do not shave 48 hours prior to surgery.  Do not bring valuables to the hospital.              [facility] is not responsible for any belongings or valuables.               Contacts, dentures or bridgework may not be worn into surgery.  Leave suitcase in the car. After surgery it may be brought to your room.  For patients admitted to the hospital, discharge time is determined by your treatment team.               Patients discharged the day of surgery will not be allowed to drive home.  Name and phone number of your driver: -   Special Instructions: Review   - Preparing For Surgery.   Please read over the following fact sheets that you were given: Pain Booklet, Coughing and Deep Breathing and Surgical Site Infection Prevention

## 2014-08-22 NOTE — Progress Notes (Signed)
Mrs Bevill's Hgb was 9.8 today.  Last Hgb done in March 2016 was 9.6, Shelby Dubin. PA notified.

## 2014-08-22 NOTE — Progress Notes (Signed)
Anesthesia Chart Review:  Patient is a 59 year old female scheduled for T12 ablation tomorrow by Dr. Estanislado Pandy.  History includes IgG kappa myeloma, former smoker, post-operative N/V, GERD, fibroids, IBS, polyrheumatica, myelitis with neck surgery with bone grafting '11. Oncologist is Dr. Marin Olp. PCP is listed as Dr. [name].  Meds include ASA, Donnatal PRN, Velcade, Decadron, Revlimid, Zofran, Protonix, Carafate, Demadex.  06/03/14 EKG: SR, borderline low voltage, extremity leads.  01/18/14 Echo: Normal LV size and systolic function, EF 71-24%. Normal diastolic function. Normal RV size and systolic function. No significant valvular abnormalities.  06/03/14 CXR: IMPRESSION: Right peritracheal lymphadenopathy similar to previous studies. No evidence of active pulmonary disease. T12 compression consistent with known metastatic disease.  Preoperative labs noted. H/H 9.8/30.1 (previously 9.6/28.8 on 08/05/14, stable since at least 03/2014).  Labs appear stable.  If no acute changes then I anticipate that she can proceed as planned.  George Hugh [facility] Short Stay Center/Anesthesiology Phone [phone] 08/22/2014 2:25 PM

## 2014-08-22 NOTE — Pre-Procedure Instructions (Signed)
Barbara Cook  08/22/2014   Your procedure is scheduled on:  Friday 15.  Report to [facility] Admitting at 5:45 AM.  Call this number if you have problems the morning of surgery: [phone]   Remember:   Do not eat food or drink liquids after midnight.   Take these medicines the morning of surgery with A SIP OF WATER: pantoprazole (PROTONIX).             Tylenol if needed.                 Stop Aspirin, Vitamins and Herbal products.   Do not wear jewelry, make-up or nail polish.  Do not wear lotions, powders, or perfumes.   Do not shave 48 hours prior to surgery.  Do not bring valuables to the hospital.              [facility] is not responsible for any belongings or valuables.               Contacts, dentures or bridgework may not be worn into surgery.  Leave suitcase in the car. After surgery it may be brought to your room.  For patients admitted to the hospital, discharge time is determined by your treatment team.               Patients discharged the day of surgery will not be allowed to drive home.  Name and phone number of your driver: -   Special Instructions: Review  [place] - Preparing For Surgery.   Please read over the following fact sheets that you were given: Pain Booklet, Coughing and Deep Breathing and Surgical Site Infection Prevention

## 2014-08-23 ENCOUNTER — Ambulatory Visit (HOSPITAL_COMMUNITY)
Admission: RE | Admit: 2014-08-23 | Discharge: 2014-08-23 | Disposition: A | Discharge status: Home / Self Care | Payer: 59 | Source: Ambulatory Visit | Attending: Interventional Radiology | Admitting: Interventional Radiology

## 2014-08-23 ENCOUNTER — Encounter (HOSPITAL_COMMUNITY): Payer: Self-pay | Admitting: Anesthesiology

## 2014-08-23 ENCOUNTER — Other Ambulatory Visit: Payer: Self-pay | Admitting: Nurse Practitioner

## 2014-08-23 ENCOUNTER — Ambulatory Visit (HOSPITAL_COMMUNITY): Payer: 59 | Admitting: Anesthesiology

## 2014-08-23 ENCOUNTER — Encounter (HOSPITAL_COMMUNITY): Payer: Self-pay

## 2014-08-23 ENCOUNTER — Ambulatory Visit (HOSPITAL_COMMUNITY): Payer: 59 | Admitting: Vascular Surgery

## 2014-08-23 ENCOUNTER — Encounter (HOSPITAL_COMMUNITY)
Admission: RE | Disposition: A | Discharge status: Home / Self Care | Payer: Self-pay | Source: Ambulatory Visit | Attending: Interventional Radiology

## 2014-08-23 DIAGNOSIS — M8458XA Pathological fracture in neoplastic disease, other specified site, initial encounter for fracture: Secondary | ICD-10-CM | POA: Insufficient documentation

## 2014-08-23 DIAGNOSIS — Z79899 Other long term (current) drug therapy: Secondary | ICD-10-CM | POA: Insufficient documentation

## 2014-08-23 DIAGNOSIS — M1990 Unspecified osteoarthritis, unspecified site: Secondary | ICD-10-CM | POA: Insufficient documentation

## 2014-08-23 DIAGNOSIS — C9000 Multiple myeloma not having achieved remission: Secondary | ICD-10-CM

## 2014-08-23 DIAGNOSIS — M549 Dorsalgia, unspecified: Secondary | ICD-10-CM

## 2014-08-23 DIAGNOSIS — M353 Polymyalgia rheumatica: Secondary | ICD-10-CM | POA: Insufficient documentation

## 2014-08-23 DIAGNOSIS — Z87891 Personal history of nicotine dependence: Secondary | ICD-10-CM | POA: Insufficient documentation

## 2014-08-23 DIAGNOSIS — K219 Gastro-esophageal reflux disease without esophagitis: Secondary | ICD-10-CM | POA: Insufficient documentation

## 2014-08-23 HISTORY — PX: RADIOLOGY WITH ANESTHESIA: SHX6223

## 2014-08-23 SURGERY — RADIOLOGY WITH ANESTHESIA
Anesthesia: General

## 2014-08-23 MED ORDER — SODIUM CHLORIDE 0.9 % IV SOLN: INTRAVENOUS | Status: DC

## 2014-08-23 MED ORDER — ONDANSETRON HCL 4 MG/2ML IJ SOLN: 4.0000 mg | Freq: Once | INTRAMUSCULAR | Status: DC | PRN

## 2014-08-23 MED ORDER — EPHEDRINE SULFATE 50 MG/ML IJ SOLN
INTRAMUSCULAR | Status: DC | PRN
  Administered 2014-08-23 (×2): 10 mg via INTRAVENOUS

## 2014-08-23 MED ORDER — ARTIFICIAL TEARS OP OINT
TOPICAL_OINTMENT | OPHTHALMIC | Status: DC | PRN
  Administered 2014-08-23: 1 via OPHTHALMIC

## 2014-08-23 MED ORDER — SCOPOLAMINE 1 MG/3DAYS TD PT72
MEDICATED_PATCH | TRANSDERMAL | Status: AC
  Filled 2014-08-23: qty 1

## 2014-08-23 MED ORDER — IOHEXOL 300 MG/ML  SOLN
50.0000 mL | Freq: Once | INTRAMUSCULAR | Status: AC | PRN
  Administered 2014-08-23: 1 mL via INTRAVENOUS

## 2014-08-23 MED ORDER — BUPIVACAINE HCL (PF) 0.25 % IJ SOLN
INTRAMUSCULAR | Status: AC
  Filled 2014-08-23: qty 30

## 2014-08-23 MED ORDER — PHENYLEPHRINE HCL 10 MG/ML IJ SOLN
INTRAMUSCULAR | Status: DC | PRN
  Administered 2014-08-23 (×2): 80 ug via INTRAVENOUS

## 2014-08-23 MED ORDER — FENTANYL CITRATE (PF) 100 MCG/2ML IJ SOLN
INTRAMUSCULAR | Status: DC | PRN
  Administered 2014-08-23: 100 ug via INTRAVENOUS

## 2014-08-23 MED ORDER — MIDAZOLAM HCL 5 MG/5ML IJ SOLN
INTRAMUSCULAR | Status: DC | PRN
  Administered 2014-08-23: 2 mg via INTRAVENOUS

## 2014-08-23 MED ORDER — LIDOCAINE HCL (CARDIAC) 20 MG/ML IV SOLN
INTRAVENOUS | Status: DC | PRN
  Administered 2014-08-23: 50 mg via INTRAVENOUS

## 2014-08-23 MED ORDER — GLYCOPYRROLATE 0.2 MG/ML IJ SOLN
INTRAMUSCULAR | Status: DC | PRN
  Administered 2014-08-23: .8 mg via INTRAVENOUS

## 2014-08-23 MED ORDER — SODIUM CHLORIDE 0.9 % IV SOLN: INTRAVENOUS | Status: AC

## 2014-08-23 MED ORDER — LACTATED RINGERS IV SOLN
INTRAVENOUS | Status: DC | PRN
  Administered 2014-08-23 (×2): via INTRAVENOUS

## 2014-08-23 MED ORDER — DEXAMETHASONE SODIUM PHOSPHATE 10 MG/ML IJ SOLN
INTRAMUSCULAR | Status: DC | PRN
  Administered 2014-08-23: 8 mg via INTRAVENOUS

## 2014-08-23 MED ORDER — ONDANSETRON HCL 4 MG/2ML IJ SOLN
INTRAMUSCULAR | Status: DC | PRN
  Administered 2014-08-23: 4 mg via INTRAVENOUS

## 2014-08-23 MED ORDER — OXYCODONE HCL 5 MG/5ML PO SOLN: 5.0000 mg | Freq: Once | ORAL | Status: DC | PRN

## 2014-08-23 MED ORDER — PROPOFOL 10 MG/ML IV BOLUS
INTRAVENOUS | Status: DC | PRN
  Administered 2014-08-23: 200 mg via INTRAVENOUS

## 2014-08-23 MED ORDER — SCOPOLAMINE 1 MG/3DAYS TD PT72
1.0000 | MEDICATED_PATCH | TRANSDERMAL | Status: DC
  Administered 2014-08-23: 1.5 mg via TRANSDERMAL

## 2014-08-23 MED ORDER — CEFAZOLIN SODIUM-DEXTROSE 2-3 GM-% IV SOLR: 2.0000 g | Freq: Once | INTRAVENOUS | Status: DC

## 2014-08-23 MED ORDER — TOBRAMYCIN SULFATE 1.2 G IJ SOLR
INTRAMUSCULAR | Status: AC
  Filled 2014-08-23: qty 1.2

## 2014-08-23 MED ORDER — OXYCODONE HCL 5 MG PO TABS: 5.0000 mg | ORAL_TABLET | Freq: Once | ORAL | Status: DC | PRN

## 2014-08-23 MED ORDER — ROCURONIUM BROMIDE 100 MG/10ML IV SOLN
INTRAVENOUS | Status: DC | PRN
  Administered 2014-08-23: 50 mg via INTRAVENOUS

## 2014-08-23 MED ORDER — NEOSTIGMINE METHYLSULFATE 10 MG/10ML IV SOLN
INTRAVENOUS | Status: DC | PRN
  Administered 2014-08-23: 4 mg via INTRAVENOUS

## 2014-08-23 MED ORDER — CEFAZOLIN SODIUM-DEXTROSE 2-3 GM-% IV SOLR
INTRAVENOUS | Status: AC
  Administered 2014-08-23: 2 g via INTRAVENOUS
  Filled 2014-08-23: qty 50

## 2014-08-23 NOTE — Anesthesia Postprocedure Evaluation (Signed)
  Anesthesia Post-op Note  Patient: Barbara Cook  Procedure(s) Performed: Procedure(s): T12  ABLATION       (RADIOLOGY WITH ANESTHESIA) (N/A)  Patient Location: PACU  Anesthesia Type:General  Level of Consciousness: awake and alert   Airway and Oxygen Therapy: Patient Spontanous Breathing  Post-op Pain: none  Post-op Assessment: Post-op Vital signs reviewed, Patient's Cardiovascular Status Stable and Respiratory Function Stable  Post-op Vital Signs: Reviewed  Filed Vitals:   08/23/14 1145  BP: 118/52  Pulse: 81  Temp:   Resp: 15    Complications: No apparent anesthesia complications

## 2014-08-23 NOTE — Progress Notes (Signed)
PT received from Neuro PACU at 1220.  No discharge instruction were in the chart.  PA and MD notified of the need for procedure specific discharge instructions.  As of this time we still do not have these.

## 2014-08-23 NOTE — Transfer of Care (Signed)
Immediate Anesthesia Transfer of Care Note  Patient: Barbara Cook  Procedure(s) Performed: Procedure(s): T12  ABLATION       (RADIOLOGY WITH ANESTHESIA) (N/A)  Patient Location: PACU  Anesthesia Type:General  Level of Consciousness: awake, alert  and oriented  Airway & Oxygen Therapy: Patient Spontanous Breathing and Patient connected to nasal cannula oxygen  Post-op Assessment: Report given to RN, Post -op Vital signs reviewed and stable and Patient moving all extremities X 4  Post vital signs: Reviewed and stable  Last Vitals: There were no vitals filed for this visit.  Complications: No apparent anesthesia complications

## 2014-08-23 NOTE — Procedures (Signed)
S/P  T 12 bone tumor RF ablation followed by augmentation with balloon kyphoplasty

## 2014-08-23 NOTE — Anesthesia Preprocedure Evaluation (Addendum)
Anesthesia Evaluation  Patient identified by MRN, date of birth, ID band Patient awake    Reviewed: Allergy & Precautions, NPO status , Patient's Chart, lab work & pertinent test results  History of Anesthesia Complications (+) PONV  Airway Mallampati: I  TM Distance: >3 FB Neck ROM: Full    Dental  (+) Teeth Intact, Dental Advisory Given   Pulmonary former smoker,  breath sounds clear to auscultation        Cardiovascular Rhythm:Regular Rate:Normal     Neuro/Psych    GI/Hepatic GERD-  Medicated and Controlled,  Endo/Other    Renal/GU      Musculoskeletal   Abdominal   Peds  Hematology   Anesthesia Other Findings   Reproductive/Obstetrics                            Anesthesia Physical Anesthesia Plan  ASA: II  Anesthesia Plan: General   Post-op Pain Management:    Induction: Intravenous  Airway Management Planned: Oral ETT  Additional Equipment:   Intra-op Plan:   Post-operative Plan: Extubation in OR  Informed Consent: I have reviewed the patients History and Physical, chart, labs and discussed the procedure including the risks, benefits and alternatives for the proposed anesthesia with the patient or authorized representative who has indicated his/her understanding and acceptance.   Dental advisory given and Dental Advisory Given  Plan Discussed with: CRNA, Anesthesiologist and Surgeon  Anesthesia Plan Comments:        Anesthesia Quick Evaluation

## 2014-08-23 NOTE — Discharge Instructions (Signed)
1.No stooping,bending or lifting more than 10 lbs for 2 weeks. 2.Use walker to ambulate.for 2 weekds. 3.RTC in 2 weeks

## 2014-08-23 NOTE — Discharge Instructions (Signed)
No stooping bending lifting more than 10 lbs for 2 weeks Use walker to ambulate for 2 weeks Return to see in 2 weeks Resume all previous meds

## 2014-08-23 NOTE — Anesthesia Procedure Notes (Signed)
Procedure Name: Intubation Date/Time: 08/23/2014 9:13 AM Performed by: Neldon Newport Pre-anesthesia Checklist: Timeout performed, Patient being monitored, Suction available, Emergency Drugs available and Patient identified Patient Re-evaluated:Patient Re-evaluated prior to inductionOxygen Delivery Method: Circle system utilized Preoxygenation: Pre-oxygenation with 100% oxygen Intubation Type: IV induction Ventilation: Mask ventilation without difficulty Laryngoscope Size: Mac and 3 Grade View: Grade I Tube type: Oral Tube size: 7.5 mm Number of attempts: 1 Placement Confirmation: breath sounds checked- equal and bilateral,  positive ETCO2 and ETT inserted through vocal cords under direct vision Secured at: 21 cm Tube secured with: Tape Dental Injury: Teeth and Oropharynx as per pre-operative assessment

## 2014-08-23 NOTE — H&P (Signed)
Referring Physician(s): Deveshwar,Sanjeev  History of Present Illness: Barbara Cook is a 59 y.o. female with IgG kappa myeloma with a pathologic compression fracture at T12. The patient has been seen and evaluated by Dr. Estanislado Pandy on 07/17/14 and scheduled today for a radiofrequency ablation followed by vertebral augmentation at T12. She denies any chest pain, shortness of breath or palpitations. She denies any active signs of bleeding or excessive bruising. She denies any recent fever, chills or urinary symptoms. The patient denies any history of sleep apnea or chronic oxygen use. She has previously tolerated anesthesia with only complaint being nausea with vomiting post procedure. She denies any current back pain.    Past Medical History  Diagnosis Date  . Arthropathy, unspecified, site unspecified   . GERD (gastroesophageal reflux disease)   . Arthritis   . 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 Endometriosis   . Fibroid   . Polymyalgia rheumatica   . IBS (irritable bowel syndrome)   . Multiple myeloma 02/18/2014  . Myelitis 2011    of bone- neck  . Family history of adverse reaction to anesthesia     Sister- patient will find out  . Complication of anesthesia     02/13/14- had biospy in X-Ray- "twilight"  "I felt every thing"  . PONV (postoperative nausea and vomiting)     1990  . Constipation     Past Surgical History  Procedure Laterality Date  . Hernia repair      umbilicial - age 30  . Tubal ligation    . Neck surgery  2011    Myletis- bone grafting- from her left hip  . Fistula repair surg    . Knee arthroscopy Left   . Colonoscopy      Allergies: Codeine  Medications: Prior to Admission medications   Medication Sig Start Date End Date Taking? Authorizing Provider  acetaminophen (TYLENOL) 500 MG tablet Take 500 mg by mouth every 6 (six) hours as needed for mild pain or headache.    Historical Provider, MD  acetaminophen (TYLENOL)  650 MG CR tablet Take 1,300 mg by mouth every 8 (eight) hours as needed for pain (Arthritis).     Historical Provider, MD  aspirin 325 MG tablet Take 325 mg by mouth daily.    Historical Provider, MD  belladona alk-PHENObarbital (DONNATAL) 16.2 MG tablet Take 1-2 pills, Every 8 hours AS NEEDED, for abdominal spasms. 07/29/14   [name], MD  bortezomib IV (VELCADE) 3.5 MG injection Inject 2.75 mg into the vein every 7 (seven) days.    Historical Provider, MD  Cholecalciferol (VITAMIN D) 1000 UNITS capsule Take 2,000 Units by mouth 2 (two) times daily. 2 IN AM AND 2 IN PM    Historical Provider, MD  dexamethasone (DECADRON) 4 MG tablet Take 5 tablets once a week with food. 06/03/14   [name], MD  lenalidomide (REVLIMID) 25 MG capsule Take 1 capsule at bedtime daily for 21 days and then off for 7 days. YSAY#[id number] 07/31/14   [name], MD  Magnesium 250 MG TABS Take 2 tablets by mouth daily. Takes 500 mg daily    Historical Provider, MD  Multiple Vitamin (MULTIVITAMIN) tablet Take 1 tablet by mouth every evening.     Historical Provider, MD  ondansetron (ZOFRAN) 8 MG tablet Take 74m twice a day. Start the day after chemo for 2 days. Then take for nausea and vomiting twice a day as needed. 05/27/14   PRudell Cobb  Ennever, MD  pantoprazole (PROTONIX) 40 MG tablet Take 1 tablet (40 mg total) by mouth 2 (two) times daily. 05/20/14   Eliezer Bottom, NP  prochlorperazine (COMPAZINE) 10 MG tablet Take 1 tablet (10 mg total) by mouth every 6 (six) hours as needed for nausea or vomiting. Patient not taking: Reported on 08/21/2014 05/27/14   [name], MD  senna-docusate (SENOKOT-S) 8.6-50 MG per tablet Take 4 tablets by mouth 2 (two) times daily.    Historical Provider, MD  sucralfate (CARAFATE) 1 GM/10ML suspension Take 10 mLs (1 g total) by mouth 4 (four) times daily -  with meals and at bedtime. 06/27/14   Gery Pray, MD  torsemide (DEMADEX) 20 MG tablet Take 1 tablet (20 mg total) by  mouth daily as needed. 06/03/14   [name], MD     Family History  Problem Relation Age of Onset  . Lung cancer Maternal Uncle   . Throat cancer Maternal Aunt   . Diabetes Maternal Grandmother   . Hypertension Maternal Grandmother   . Heart disease Maternal Grandmother   . Heart disease Paternal Grandmother   . Colon cancer Neg Hx   . Heart disease Mother   . Rheum arthritis Mother     History   Social History  . Marital Status: Married    Spouse Name: [name]  . Number of Children: 1  . Years of Education: 12   Occupational History  . [employer]   Social History Main Topics  . Smoking status: Former Smoker -- 25.00 packs/day for 5 years    Types: Cigarettes    Start date: 05/26/1988    Quit date: 01/11/1994  . Smokeless tobacco: Never Used     Comment: QUIT SMOKING 20 YEARS AGO  . Alcohol Use: No  . Drug Use: No  . Sexual Activity: Not on file     Comment: BTL   Other Topics Concern  . None   Social History Narrative   Patient lives at home with her husband [name]). Patient works full time.   Education- High school   Right handed.   Caffeine- None    Review of Systems: A 12 point ROS discussed and pertinent positives are indicated in the HPI above.  All other systems are negative.  Review of Systems  Vital Signs: BP 134/64 mmHg  Pulse 87  Temp(Src) 97.8 F (36.6 C) (Oral)  Resp 18  Ht 5' 4"  (1.626 m)  Wt 181 lb (82.101 kg)  BMI 31.05 kg/m2  SpO2 100%  Physical Exam  Constitutional: She is oriented to person, place, and time. No distress.  HENT:  Head: Normocephalic and atraumatic.  Neck: No tracheal deviation present.  Cardiovascular: Normal rate and regular rhythm.  Exam reveals no gallop and no friction rub.   No murmur heard. Pulmonary/Chest: Effort normal and breath sounds normal. No respiratory distress. She has no wheezes. She has no rales.  Abdominal: Soft. Bowel sounds are normal. She exhibits no distension.  There is no tenderness.  Neurological: She is alert and oriented to person, place, and time.  Skin: Skin is warm and dry. She is not diaphoretic.    Mallampati Score:  MD Evaluation Airway: WNL Heart: WNL Abdomen: WNL Chest/ Lungs: WNL ASA  Classification: 3 Mallampati/Airway Score: One  Imaging: No results found.  Labs:  CBC:  Recent Labs  07/15/14 1006 07/29/14 1208 08/05/14 1158 08/22/14 0956  WBC 3.8* 3.5* 2.7* 3.9*  HGB 9.1* 9.2* 9.6* 9.8*  HCT 28.3* 27.8* 28.8* 30.1*  PLT 130* 131* 111* 184    COAGS:  Recent Labs  02/13/14 0735 08/22/14 0956  INR 1.11 1.03  APTT 29  --     BMP:  Recent Labs  06/03/14 0122  07/15/14 1006 07/29/14 1208 08/05/14 1158 08/22/14 0956  NA 133*  < > 142 143 135 137  K 3.9  < > 3.7 3.7 3.6 3.8  CL 104  < > 101 103 98 99  CO2 24  < > 31 31 31 30   GLUCOSE 98  < > 99 98 95 88  BUN 12  < > 9 15 10 16   CALCIUM 7.8*  < > 8.1 9.0 9.1 9.2  CREATININE 0.82  < > 0.5* 0.9 0.8 0.95  GFRNONAA 77*  --   --   --   --  64*  GFRAA 89*  --   --   --   --  75*  < > = values in this interval not displayed.  LIVER FUNCTION TESTS:  Recent Labs  12/10/13 1351 02/04/14 0933 03/26/14 1027  06/03/14 0122  07/08/14 1211 07/15/14 1006 07/29/14 1208 08/05/14 1158  BILITOT 0.3 0.5 0.4  < > 0.5  < > 0.70 0.70 0.80 0.80  AST 30 22 18   < > 40*  < > 33 28 27 25   ALT 28 21 17   < > 43*  < > 53* 40 36 31  ALKPHOS 44 45 51  < > 47  < > 51 41 43 43  PROT 8.1 8.3 9.0*  < > 8.6*  < > 8.0 7.6 7.6 7.3  ALBUMIN 3.6 3.6 3.8  --  3.6  --   --   --   --   --   < > = values in this interval not displayed.  Assessment and Plan: IgG kappa Myeloma Pathologic compression fracture T12 Seen in consult with Dr. Estanislado Pandy  Scheduled today for image guided T12 radiofrequency ablation followed by vertebral augmentation with general anesthesia The patient has been NPO, no blood thinners taken, labs and vitals have been reviewed. Risks and Benefits  discussed with the patient including, but not limited to bleeding, infection, cement migration which may cause spinal cord damage, paralysis, pulmonary embolism or even death. All of the patient's questions were answered, patient is agreeable to proceed. Consent signed and in chart.   SignedHedy Jacob 08/23/2014, 8:09 AM

## 2014-08-26 ENCOUNTER — Ambulatory Visit (HOSPITAL_BASED_OUTPATIENT_CLINIC_OR_DEPARTMENT_OTHER): Payer: 59 | Admitting: Hematology & Oncology

## 2014-08-26 ENCOUNTER — Other Ambulatory Visit (HOSPITAL_BASED_OUTPATIENT_CLINIC_OR_DEPARTMENT_OTHER): Payer: 59

## 2014-08-26 ENCOUNTER — Encounter: Payer: Self-pay | Admitting: Hematology & Oncology

## 2014-08-26 ENCOUNTER — Ambulatory Visit: Payer: 59

## 2014-08-26 VITALS — BP 120/50 | HR 90 | Temp 98.0°F | Resp 16 | Ht 64.0 in | Wt 191.0 lb

## 2014-08-26 DIAGNOSIS — C9000 Multiple myeloma not having achieved remission: Secondary | ICD-10-CM

## 2014-08-26 LAB — CMP (CANCER CENTER ONLY)
ALT: 43 U/L (ref 10–47)
AST: 26 U/L (ref 11–38)
Albumin: 3.6 g/dL (ref 3.3–5.5)
Alkaline Phosphatase: 40 U/L (ref 26–84)
BUN: 12 mg/dL (ref 7–22)
CALCIUM: 8.9 mg/dL (ref 8.0–10.3)
CHLORIDE: 104 meq/L (ref 98–108)
CO2: 32 meq/L (ref 18–33)
Creat: 0.8 mg/dl (ref 0.6–1.2)
Glucose, Bld: 96 mg/dL (ref 73–118)
Potassium: 3.5 mEq/L (ref 3.3–4.7)
Sodium: 142 mEq/L (ref 128–145)
Total Bilirubin: 0.8 mg/dl (ref 0.20–1.60)
Total Protein: 7.5 g/dL (ref 6.4–8.1)

## 2014-08-26 LAB — CBC WITH DIFFERENTIAL (CANCER CENTER ONLY)
BASO#: 0 10*3/uL (ref 0.0–0.2)
BASO%: 1.1 % (ref 0.0–2.0)
EOS%: 7 % (ref 0.0–7.0)
Eosinophils Absolute: 0.3 10*3/uL (ref 0.0–0.5)
HCT: 27.9 % — ABNORMAL LOW (ref 34.8–46.6)
HEMOGLOBIN: 9.2 g/dL — AB (ref 11.6–15.9)
LYMPH#: 0.8 10*3/uL — AB (ref 0.9–3.3)
LYMPH%: 21.1 % (ref 14.0–48.0)
MCH: 30.3 pg (ref 26.0–34.0)
MCHC: 33 g/dL (ref 32.0–36.0)
MCV: 92 fL (ref 81–101)
MONO#: 0.5 10*3/uL (ref 0.1–0.9)
MONO%: 14.2 % — AB (ref 0.0–13.0)
NEUT%: 56.6 % (ref 39.6–80.0)
NEUTROS ABS: 2.1 10*3/uL (ref 1.5–6.5)
PLATELETS: 118 10*3/uL — AB (ref 145–400)
RBC: 3.04 10*6/uL — ABNORMAL LOW (ref 3.70–5.32)
RDW: 16.4 % — AB (ref 11.1–15.7)
WBC: 3.7 10*3/uL — ABNORMAL LOW (ref 3.9–10.0)

## 2014-08-26 NOTE — Progress Notes (Signed)
Hematology and Oncology Follow Up Visit  Barbara Cook [id number] 18-Jan-1956 59 y.o. 08/26/2014   Principle Diagnosis:  IgG kappa myeloma   Current Therapy:     Velcade/Revlimid/Decadron   Zometa 4 mg IV every month     Interim History:  Ms.  Cook is back for a follow-up. She is a little sore today. She had her T 12 kyphoplasty on Friday. She did well with this. She and her husband are little bit irritated about the fact that they did not get any kind of discharge instructions. Over, this will be resolved.  Her myeloma studies continue to improve. Her M spike is now down to 1.1 g/dL. Her IgG level is 1860 mg/dL. Her Kappa Lightchain is 4.9 mg/dL.  She has not had any problems with cough. She's had no shortness of breath. She has some diarrhea after the Velcade. Again, I'm one to give her a two-week break which I will help her out. She's had no rashes. She's had no nausea or vomiting.  Currently, her performance status is ECOG 1    Medications:  Current outpatient prescriptions:  .  acetaminophen (TYLENOL) 500 MG tablet, Take 500 mg by mouth every 6 (six) hours as needed for mild pain or headache., Disp: , Rfl:  .  aspirin 325 MG tablet, Take 325 mg by mouth daily., Disp: , Rfl:  .  belladona alk-PHENObarbital (DONNATAL) 16.2 MG tablet, Take 1-2 pills, Every 8 hours AS NEEDED, for abdominal spasms., Disp: 60 tablet, Rfl: 1 .  bortezomib IV (VELCADE) 3.5 MG injection, Inject 2.75 mg into the vein every 7 (seven) days., Disp: , Rfl:  .  Cholecalciferol (VITAMIN D) 1000 UNITS capsule, Take 2,000 Units by mouth 2 (two) times daily. 2 IN AM AND 2 IN PM, Disp: , Rfl:  .  dexamethasone (DECADRON) 4 MG tablet, Take 5 tablets once a week with food., Disp: 80 tablet, Rfl: 4 .  lenalidomide (REVLIMID) 25 MG capsule, Take 1 capsule at bedtime daily for 21 days and then off for 7 days. [id number], Disp: 21 capsule, Rfl: 6 .  Magnesium 250 MG TABS, Take 2 tablets by mouth daily. Takes 500  mg daily, Disp: , Rfl:  .  Multiple Vitamin (MULTIVITAMIN) tablet, Take 1 tablet by mouth every evening. , Disp: , Rfl:  .  ondansetron (ZOFRAN) 8 MG tablet, Take 9m twice a day. Start the day after chemo for 2 days. Then take for nausea and vomiting twice a day as needed., Disp: 30 tablet, Rfl: 3 .  pantoprazole (PROTONIX) 40 MG tablet, Take 1 tablet (40 mg total) by mouth 2 (two) times daily., Disp: 60 tablet, Rfl: 6 .  senna-docusate (SENOKOT-S) 8.6-50 MG per tablet, Take 4 tablets by mouth 2 (two) times daily., Disp: , Rfl:  .  sucralfate (CARAFATE) 1 GM/10ML suspension, Take 10 mLs (1 g total) by mouth 4 (four) times daily -  with meals and at bedtime., Disp: 420 mL, Rfl: 0 .  torsemide (DEMADEX) 20 MG tablet, Take 1 tablet (20 mg total) by mouth daily as needed., Disp: 30 tablet, Rfl: 6  Allergies:  Allergies  Allergen Reactions  . Codeine Palpitations    Past Medical History, Surgical history, Social history, and Family History were reviewed and updated.  Review of Systems: As above  Physical Exam:  height is _0  (1.626 m) and weight is 191 lb (86.637 kg). Her oral temperature is 98 F (36.7 C). Her blood pressure is 120/50 and her pulse is 90.  Her respiration is 16.   Well-developed and well-nourished African American female. Head and neck exam shows no ocular or oral lesions. There are no palpable cervical or supraclavicular lymph nodes. Lungs are clear. Cardiac exam regular rate and rhythm with no murmurs, rubs or bruits. Abdomen is soft. She is mildly obese. She is no palpable liver or spleen tip. Back exam shows no tenderness over the spine, ribs or hips. Extremity shows no clubbing, cyanosis or edema. Skin exam shows no rashes, ecchymoses or petechia. Neurological exam is nonfocal.  Lab Results  Component Value Date   WBC 3.7* 08/26/2014   HGB 9.2* 08/26/2014   HCT 27.9* 08/26/2014   MCV 92 08/26/2014   PLT 118* 08/26/2014     Chemistry      Component Value  Date/Time   NA 137 08/22/2014 0956   NA 135 08/05/2014 1158   K 3.8 08/22/2014 0956   K 3.6 08/05/2014 1158   CL 99 08/22/2014 0956   CL 98 08/05/2014 1158   CO2 30 08/22/2014 0956   CO2 31 08/05/2014 1158   BUN 16 08/22/2014 0956   BUN 10 08/05/2014 1158   CREATININE 0.95 08/22/2014 0956   CREATININE 0.8 08/05/2014 1158      Component Value Date/Time   CALCIUM 9.2 08/22/2014 0956   CALCIUM 9.1 08/05/2014 1158   ALKPHOS 43 08/05/2014 1158   ALKPHOS 47 06/03/2014 0122   AST 25 08/05/2014 1158   AST 40* 06/03/2014 0122   ALT 31 08/05/2014 1158   ALT 43* 06/03/2014 0122   BILITOT 0.80 08/05/2014 1158   BILITOT 0.5 06/03/2014 0122         Impression and Plan: Barbara Cook is 59 year old African-American female with IgG kappa myeloma.   She really is doing quite well. Her M- spike keeps coming down slowly but surely. Again, we will give her a 2 week break. I think this will help her out. I she will continue to recover from the T12 kyphoplasty.  We will plan to see her back in about 2 weeks.  She has a birthday coming up this week. I think it will be nice to give her the week off.      [name], MD 4/18/20169:12 AM

## 2014-08-27 ENCOUNTER — Other Ambulatory Visit (HOSPITAL_COMMUNITY): Payer: Self-pay | Admitting: Interventional Radiology

## 2014-08-27 ENCOUNTER — Telehealth: Payer: Self-pay | Admitting: Hematology & Oncology

## 2014-08-27 DIAGNOSIS — M549 Dorsalgia, unspecified: Secondary | ICD-10-CM

## 2014-08-27 DIAGNOSIS — C9000 Multiple myeloma not having achieved remission: Secondary | ICD-10-CM

## 2014-08-27 DIAGNOSIS — IMO0002 Reserved for concepts with insufficient information to code with codable children: Secondary | ICD-10-CM

## 2014-08-27 NOTE — Telephone Encounter (Signed)
[employer] forms completed and faxed to:  [employer] [facility] [place]  P: [phone] F: [phone]  Email: [email or web address]     COPY SCANNED

## 2014-08-28 LAB — KAPPA/LAMBDA LIGHT CHAINS
KAPPA FREE LGHT CHN: 3.82 mg/dL — AB (ref 0.33–1.94)
Kappa:Lambda Ratio: 4.02 — ABNORMAL HIGH (ref 0.26–1.65)
Lambda Free Lght Chn: 0.95 mg/dL (ref 0.57–2.63)

## 2014-08-28 LAB — IGG, IGA, IGM
IGA: 33 mg/dL — AB (ref 69–380)
IGM, SERUM: 14 mg/dL — AB (ref 52–322)
IgG (Immunoglobin G), Serum: 1630 mg/dL (ref 690–1700)

## 2014-08-28 LAB — LACTATE DEHYDROGENASE: LDH: 180 U/L (ref 94–250)

## 2014-08-28 LAB — IFE INTERPRETATION

## 2014-08-29 ENCOUNTER — Encounter: Payer: Self-pay | Admitting: *Deleted

## 2014-08-30 ENCOUNTER — Other Ambulatory Visit: Payer: Self-pay | Admitting: *Deleted

## 2014-08-30 DIAGNOSIS — C9000 Multiple myeloma not having achieved remission: Secondary | ICD-10-CM

## 2014-08-30 MED ORDER — LENALIDOMIDE 25 MG PO CAPS: ORAL_CAPSULE | ORAL | Status: DC

## 2014-09-02 ENCOUNTER — Other Ambulatory Visit: Payer: 59

## 2014-09-02 ENCOUNTER — Ambulatory Visit: Payer: 59

## 2014-09-02 ENCOUNTER — Other Ambulatory Visit: Payer: 59 | Admitting: Lab

## 2014-09-03 LAB — PROTEIN ELECTROPHORESIS, SERUM, WITH REFLEX
ABNORMAL PROTEIN BAND1: 1 g/dL
Total Protein, Serum Electrophoresis: 6.8 g/dL (ref 6.1–8.1)

## 2014-09-04 ENCOUNTER — Telehealth: Payer: Self-pay | Admitting: Hematology & Oncology

## 2014-09-04 NOTE — Telephone Encounter (Signed)
Faxed completed claim forms to:  [employer] Ph: [phone] Fx: [phone]  Case: [id number] Policy: [id number]     COPY SCANNED

## 2014-09-06 ENCOUNTER — Ambulatory Visit (HOSPITAL_COMMUNITY)
Admission: RE | Admit: 2014-09-06 | Discharge: 2014-09-06 | Disposition: A | Discharge status: Home / Self Care | Payer: 59 | Source: Ambulatory Visit | Attending: Interventional Radiology | Admitting: Interventional Radiology

## 2014-09-06 DIAGNOSIS — C9000 Multiple myeloma not having achieved remission: Secondary | ICD-10-CM

## 2014-09-06 DIAGNOSIS — IMO0002 Reserved for concepts with insufficient information to code with codable children: Secondary | ICD-10-CM

## 2014-09-06 DIAGNOSIS — M549 Dorsalgia, unspecified: Secondary | ICD-10-CM

## 2014-09-09 ENCOUNTER — Ambulatory Visit (HOSPITAL_BASED_OUTPATIENT_CLINIC_OR_DEPARTMENT_OTHER): Payer: 59

## 2014-09-09 ENCOUNTER — Other Ambulatory Visit: Payer: 59 | Admitting: Lab

## 2014-09-09 ENCOUNTER — Ambulatory Visit: Payer: 59

## 2014-09-09 VITALS — BP 112/57 | HR 76 | Temp 97.9°F | Resp 18

## 2014-09-09 DIAGNOSIS — C9000 Multiple myeloma not having achieved remission: Secondary | ICD-10-CM

## 2014-09-09 DIAGNOSIS — Z5112 Encounter for antineoplastic immunotherapy: Secondary | ICD-10-CM

## 2014-09-09 LAB — CBC WITH DIFFERENTIAL (CANCER CENTER ONLY)
BASO#: 0 10*3/uL (ref 0.0–0.2)
BASO%: 0.2 % (ref 0.0–2.0)
EOS ABS: 0 10*3/uL (ref 0.0–0.5)
EOS%: 0.4 % (ref 0.0–7.0)
HCT: 29.3 % — ABNORMAL LOW (ref 34.8–46.6)
HGB: 9.6 g/dL — ABNORMAL LOW (ref 11.6–15.9)
LYMPH#: 0.9 10*3/uL (ref 0.9–3.3)
LYMPH%: 18.1 % (ref 14.0–48.0)
MCH: 29.7 pg (ref 26.0–34.0)
MCHC: 32.8 g/dL (ref 32.0–36.0)
MCV: 91 fL (ref 81–101)
MONO#: 0.7 10*3/uL (ref 0.1–0.9)
MONO%: 14.1 % — ABNORMAL HIGH (ref 0.0–13.0)
NEUT#: 3.2 10*3/uL (ref 1.5–6.5)
NEUT%: 67.2 % (ref 39.6–80.0)
Platelets: 234 10*3/uL (ref 145–400)
RBC: 3.23 10*6/uL — AB (ref 3.70–5.32)
RDW: 15.6 % (ref 11.1–15.7)
WBC: 4.7 10*3/uL (ref 3.9–10.0)

## 2014-09-09 LAB — CMP (CANCER CENTER ONLY)
ALBUMIN: 3.5 g/dL (ref 3.3–5.5)
ALT(SGPT): 35 U/L (ref 10–47)
AST: 31 U/L (ref 11–38)
Alkaline Phosphatase: 49 U/L (ref 26–84)
BUN: 20 mg/dL (ref 7–22)
CO2: 34 mEq/L — ABNORMAL HIGH (ref 18–33)
Calcium: 9.5 mg/dL (ref 8.0–10.3)
Chloride: 96 mEq/L — ABNORMAL LOW (ref 98–108)
Creat: 1 mg/dl (ref 0.6–1.2)
Glucose, Bld: 97 mg/dL (ref 73–118)
POTASSIUM: 3 meq/L — AB (ref 3.3–4.7)
SODIUM: 142 meq/L (ref 128–145)
TOTAL PROTEIN: 7.8 g/dL (ref 6.4–8.1)
Total Bilirubin: 0.7 mg/dl (ref 0.20–1.60)

## 2014-09-09 MED ORDER — ONDANSETRON HCL 8 MG PO TABS
8.0000 mg | ORAL_TABLET | Freq: Once | ORAL | Status: AC
  Administered 2014-09-09: 8 mg via ORAL

## 2014-09-09 MED ORDER — BORTEZOMIB CHEMO SQ INJECTION 3.5 MG (2.5MG/ML)
1.3000 mg/m2 | Freq: Once | INTRAMUSCULAR | Status: AC
  Administered 2014-09-09: 2.75 mg via SUBCUTANEOUS
  Filled 2014-09-09: qty 2.75

## 2014-09-09 MED ORDER — ONDANSETRON HCL 8 MG PO TABS
ORAL_TABLET | ORAL | Status: AC
  Filled 2014-09-09: qty 1

## 2014-09-09 NOTE — Patient Instructions (Signed)
Bortezomib injection   What is this medicine? BORTEZOMIB (bor TEZ oh mib) is a chemotherapy drug. It slows the growth of cancer cells. This medicine is used to treat multiple myeloma, and certain lymphomas, such as mantle-cell lymphoma. This medicine may be used for other purposes; ask your health care provider or pharmacist if you have questions. COMMON BRAND NAME(S): Velcade What should I tell my health care provider before I take this medicine? They need to know if you have any of these conditions: -diabetes -heart disease -irregular heartbeat -liver disease -on hemodialysis -low blood counts, like low white blood cells, platelets, or hemoglobin -peripheral neuropathy -taking medicine for blood pressure -an unusual or allergic reaction to bortezomib, mannitol, boron, other medicines, foods, dyes, or preservatives -pregnant or trying to get pregnant -breast-feeding How should I use this medicine? This medicine is for injection into a vein or for injection under the skin. It is given by a health care professional in a hospital or clinic setting. Talk to your pediatrician regarding the use of this medicine in children. Special care may be needed. Overdosage: If you think you have taken too much of this medicine contact a poison control center or emergency room at once. NOTE: This medicine is only for you. Do not share this medicine with others. What if I miss a dose? It is important not to miss your dose. Call your doctor or health care professional if you are unable to keep an appointment. What may interact with this medicine? This medicine may interact with the following medications: -ketoconazole -rifampin -ritonavir -St. John's Wort This list may not describe all possible interactions. Give your health care provider a list of all the medicines, herbs, non-prescription drugs, or dietary supplements you use. Also tell them if you smoke, drink alcohol, or use illegal drugs.  Some items may interact with your medicine. What should I watch for while using this medicine? Visit your doctor for checks on your progress. This drug may make you feel generally unwell. This is not uncommon, as chemotherapy can affect healthy cells as well as cancer cells. Report any side effects. Continue your course of treatment even though you feel ill unless your doctor tells you to stop. You may get drowsy or dizzy. Do not drive, use machinery, or do anything that needs mental alertness until you know how this medicine affects you. Do not stand or sit up quickly, especially if you are an older patient. This reduces the risk of dizzy or fainting spells. In some cases, you may be given additional medicines to help with side effects. Follow all directions for their use. Call your doctor or health care professional for advice if you get a fever, chills or sore throat, or other symptoms of a cold or flu. Do not treat yourself. This drug decreases your body's ability to fight infections. Try to avoid being around people who are sick. This medicine may increase your risk to bruise or bleed. Call your doctor or health care professional if you notice any unusual bleeding. You may need blood work done while you are taking this medicine. In some patients, this medicine may cause a serious brain infection that may cause death. If you have any problems seeing, thinking, speaking, walking, or standing, tell your doctor right away. If you cannot reach your doctor, urgently seek other source of medical care. Do not become pregnant while taking this medicine. Women should inform their doctor if they wish to become pregnant or think they might  be pregnant. There is a potential for serious side effects to an unborn child. Talk to your health care professional or pharmacist for more information. Do not breast-feed an infant while taking this medicine. Check with your doctor or health care professional if you get an  attack of severe diarrhea, nausea and vomiting, or if you sweat a lot. The loss of too much body fluid can make it dangerous for you to take this medicine. What side effects may I notice from receiving this medicine? Side effects that you should report to your doctor or health care professional as soon as possible: -allergic reactions like skin rash, itching or hives, swelling of the face, lips, or tongue -breathing problems -changes in hearing -changes in vision -fast, irregular heartbeat -feeling faint or lightheaded, falls -pain, tingling, numbness in the hands or feet -right upper belly pain -seizures -swelling of the ankles, feet, hands -unusual bleeding or bruising -unusually weak or tired -vomiting -yellowing of the eyes or skin Side effects that usually do not require medical attention (report to your doctor or health care professional if they continue or are bothersome): -changes in emotions or moods -constipation -diarrhea -loss of appetite -headache -irritation at site where injected -nausea This list may not describe all possible side effects. Call your doctor for medical advice about side effects. You may report side effects to FDA at 1-800-FDA-1088. Where should I keep my medicine? This drug is given in a hospital or clinic and will not be stored at home. NOTE: This sheet is a summary. It may not cover all possible information. If you have questions about this medicine, talk to your doctor, pharmacist, or health care provider.  2015, Elsevier/Gold Standard. (2013-02-19 12:46:32)   Take Donnatol as needed for cramping; take the Imodium today at bedtime to help prevent the diarrhea.

## 2014-09-11 ENCOUNTER — Ambulatory Visit (HOSPITAL_BASED_OUTPATIENT_CLINIC_OR_DEPARTMENT_OTHER): Payer: 59

## 2014-09-11 VITALS — BP 118/63 | HR 75 | Temp 98.0°F | Resp 18

## 2014-09-11 DIAGNOSIS — C9000 Multiple myeloma not having achieved remission: Secondary | ICD-10-CM

## 2014-09-11 LAB — KAPPA/LAMBDA LIGHT CHAINS
Kappa free light chain: 3.37 mg/dL — ABNORMAL HIGH (ref 0.33–1.94)
Kappa:Lambda Ratio: 2.46 — ABNORMAL HIGH (ref 0.26–1.65)
Lambda Free Lght Chn: 1.37 mg/dL (ref 0.57–2.63)

## 2014-09-11 LAB — IGG, IGA, IGM
IGG (IMMUNOGLOBIN G), SERUM: 1890 mg/dL — AB (ref 690–1700)
IGM, SERUM: 10 mg/dL — AB (ref 52–322)
IgA: 50 mg/dL — ABNORMAL LOW (ref 69–380)

## 2014-09-11 LAB — PROTEIN ELECTROPHORESIS, SERUM, WITH REFLEX
ABNORMAL PROTEIN BAND1: 1.1 g/dL
ALPHA-1-GLOBULIN: 0.3 g/dL (ref 0.2–0.3)
ALPHA-2-GLOBULIN: 0.7 g/dL (ref 0.5–0.9)
Albumin ELP: 3.8 g/dL (ref 3.8–4.8)
BETA 2: 0.3 g/dL (ref 0.2–0.5)
BETA GLOBULIN: 0.4 g/dL (ref 0.4–0.6)
GAMMA GLOBULIN: 1.6 g/dL (ref 0.8–1.7)
Total Protein, Serum Electrophoresis: 7.1 g/dL (ref 6.1–8.1)

## 2014-09-11 LAB — LACTATE DEHYDROGENASE: LDH: 192 U/L (ref 94–250)

## 2014-09-11 LAB — IFE INTERPRETATION

## 2014-09-11 MED ORDER — SODIUM CHLORIDE 0.9 % IV SOLN
Freq: Once | INTRAVENOUS | Status: AC
  Administered 2014-09-11: 13:00:00 via INTRAVENOUS

## 2014-09-11 MED ORDER — ZOLEDRONIC ACID 4 MG/100ML IV SOLN
4.0000 mg | Freq: Once | INTRAVENOUS | Status: AC
  Administered 2014-09-11: 4 mg via INTRAVENOUS
  Filled 2014-09-11: qty 100

## 2014-09-11 NOTE — Patient Instructions (Signed)

## 2014-09-16 ENCOUNTER — Other Ambulatory Visit: Payer: Self-pay | Admitting: *Deleted

## 2014-09-16 ENCOUNTER — Other Ambulatory Visit: Payer: Self-pay | Admitting: Nurse Practitioner

## 2014-09-16 ENCOUNTER — Other Ambulatory Visit (HOSPITAL_BASED_OUTPATIENT_CLINIC_OR_DEPARTMENT_OTHER): Payer: 59

## 2014-09-16 ENCOUNTER — Ambulatory Visit (HOSPITAL_BASED_OUTPATIENT_CLINIC_OR_DEPARTMENT_OTHER): Payer: 59

## 2014-09-16 VITALS — BP 104/51 | HR 79 | Temp 98.1°F | Resp 18

## 2014-09-16 DIAGNOSIS — C9000 Multiple myeloma not having achieved remission: Secondary | ICD-10-CM

## 2014-09-16 DIAGNOSIS — Z5112 Encounter for antineoplastic immunotherapy: Secondary | ICD-10-CM | POA: Diagnosis not present

## 2014-09-16 LAB — URINALYSIS, MICROSCOPIC (CHCC SATELLITE)
BLOOD: NEGATIVE
Bilirubin (Urine): NEGATIVE
GLUCOSE UR: NEGATIVE mg/dL
Ketones: NEGATIVE mg/dL
NITRITE: NEGATIVE
Protein: NEGATIVE mg/dL
Specific Gravity, Urine: 1.01 (ref 1.003–1.035)
Urobilinogen, UR: 0.2 mg/dL (ref 0.2–1)
pH: 6 (ref 4.60–8.00)

## 2014-09-16 LAB — CBC WITH DIFFERENTIAL (CANCER CENTER ONLY)
BASO#: 0 10*3/uL (ref 0.0–0.2)
BASO%: 0.3 % (ref 0.0–2.0)
EOS%: 3.9 % (ref 0.0–7.0)
Eosinophils Absolute: 0.1 10*3/uL (ref 0.0–0.5)
HEMATOCRIT: 29.2 % — AB (ref 34.8–46.6)
HEMOGLOBIN: 9.7 g/dL — AB (ref 11.6–15.9)
LYMPH#: 0.7 10*3/uL — ABNORMAL LOW (ref 0.9–3.3)
LYMPH%: 19.2 % (ref 14.0–48.0)
MCH: 30.2 pg (ref 26.0–34.0)
MCHC: 33.2 g/dL (ref 32.0–36.0)
MCV: 91 fL (ref 81–101)
MONO#: 0.3 10*3/uL (ref 0.1–0.9)
MONO%: 7.9 % (ref 0.0–13.0)
NEUT#: 2.4 10*3/uL (ref 1.5–6.5)
NEUT%: 68.7 % (ref 39.6–80.0)
Platelets: 134 10*3/uL — ABNORMAL LOW (ref 145–400)
RBC: 3.21 10*6/uL — ABNORMAL LOW (ref 3.70–5.32)
RDW: 16 % — AB (ref 11.1–15.7)
WBC: 3.6 10*3/uL — ABNORMAL LOW (ref 3.9–10.0)

## 2014-09-16 LAB — CMP (CANCER CENTER ONLY)
ALK PHOS: 49 U/L (ref 26–84)
ALT(SGPT): 41 U/L (ref 10–47)
AST: 37 U/L (ref 11–38)
Albumin: 4 g/dL (ref 3.3–5.5)
BUN: 15 mg/dL (ref 7–22)
CO2: 32 meq/L (ref 18–33)
CREATININE: 0.9 mg/dL (ref 0.6–1.2)
Calcium: 8.7 mg/dL (ref 8.0–10.3)
Chloride: 97 mEq/L — ABNORMAL LOW (ref 98–108)
GLUCOSE: 90 mg/dL (ref 73–118)
Potassium: 2.8 mEq/L — CL (ref 3.3–4.7)
SODIUM: 139 meq/L (ref 128–145)
Total Bilirubin: 0.8 mg/dl (ref 0.20–1.60)
Total Protein: 7.7 g/dL (ref 6.4–8.1)

## 2014-09-16 MED ORDER — POTASSIUM CHLORIDE CRYS ER 20 MEQ PO TBCR: 20.0000 meq | EXTENDED_RELEASE_TABLET | Freq: Every day | ORAL | Status: DC

## 2014-09-16 MED ORDER — SULFAMETHOXAZOLE-TRIMETHOPRIM 800-160 MG PO TABS: 1.0000 | ORAL_TABLET | Freq: Two times a day (BID) | ORAL | Status: DC

## 2014-09-16 MED ORDER — BORTEZOMIB CHEMO SQ INJECTION 3.5 MG (2.5MG/ML)
1.3000 mg/m2 | Freq: Once | INTRAMUSCULAR | Status: AC
  Administered 2014-09-16: 2.75 mg via SUBCUTANEOUS
  Filled 2014-09-16: qty 2.75

## 2014-09-16 MED ORDER — ONDANSETRON HCL 8 MG PO TABS
ORAL_TABLET | ORAL | Status: AC
  Filled 2014-09-16: qty 1

## 2014-09-16 MED ORDER — SODIUM CHLORIDE 0.9 % IV SOLN: 510.0000 mg | Freq: Once | INTRAVENOUS | Status: DC

## 2014-09-16 MED ORDER — ONDANSETRON HCL 8 MG PO TABS
8.0000 mg | ORAL_TABLET | Freq: Once | ORAL | Status: AC
  Administered 2014-09-16: 8 mg via ORAL

## 2014-09-16 NOTE — Patient Instructions (Signed)
Bortezomib injection What is this medicine? BORTEZOMIB (bor TEZ oh mib) is a chemotherapy drug. It slows the growth of cancer cells. This medicine is used to treat multiple myeloma, and certain lymphomas, such as mantle-cell lymphoma. This medicine may be used for other purposes; ask your health care provider or pharmacist if you have questions. COMMON BRAND NAME(S): Velcade What should I tell my health care provider before I take this medicine? They need to know if you have any of these conditions: -diabetes -heart disease -irregular heartbeat -liver disease -on hemodialysis -low blood counts, like low white blood cells, platelets, or hemoglobin -peripheral neuropathy -taking medicine for blood pressure -an unusual or allergic reaction to bortezomib, mannitol, boron, other medicines, foods, dyes, or preservatives -pregnant or trying to get pregnant -breast-feeding How should I use this medicine? This medicine is for injection into a vein or for injection under the skin. It is given by a health care professional in a hospital or clinic setting. Talk to your pediatrician regarding the use of this medicine in children. Special care may be needed. Overdosage: If you think you have taken too much of this medicine contact a poison control center or emergency room at once. NOTE: This medicine is only for you. Do not share this medicine with others. What if I miss a dose? It is important not to miss your dose. Call your doctor or health care professional if you are unable to keep an appointment. What may interact with this medicine? This medicine may interact with the following medications: -ketoconazole -rifampin -ritonavir -St. John's Wort This list may not describe all possible interactions. Give your health care provider a list of all the medicines, herbs, non-prescription drugs, or dietary supplements you use. Also tell them if you smoke, drink alcohol, or use illegal drugs. Some items  may interact with your medicine. What should I watch for while using this medicine? Visit your doctor for checks on your progress. This drug may make you feel generally unwell. This is not uncommon, as chemotherapy can affect healthy cells as well as cancer cells. Report any side effects. Continue your course of treatment even though you feel ill unless your doctor tells you to stop. You may get drowsy or dizzy. Do not drive, use machinery, or do anything that needs mental alertness until you know how this medicine affects you. Do not stand or sit up quickly, especially if you are an older patient. This reduces the risk of dizzy or fainting spells. In some cases, you may be given additional medicines to help with side effects. Follow all directions for their use. Call your doctor or health care professional for advice if you get a fever, chills or sore throat, or other symptoms of a cold or flu. Do not treat yourself. This drug decreases your body's ability to fight infections. Try to avoid being around people who are sick. This medicine may increase your risk to bruise or bleed. Call your doctor or health care professional if you notice any unusual bleeding. You may need blood work done while you are taking this medicine. In some patients, this medicine may cause a serious brain infection that may cause death. If you have any problems seeing, thinking, speaking, walking, or standing, tell your doctor right away. If you cannot reach your doctor, urgently seek other source of medical care. Do not become pregnant while taking this medicine. Women should inform their doctor if they wish to become pregnant or think they might be pregnant. There is   a potential for serious side effects to an unborn child. Talk to your health care professional or pharmacist for more information. Do not breast-feed an infant while taking this medicine. Check with your doctor or health care professional if you get an attack of  severe diarrhea, nausea and vomiting, or if you sweat a lot. The loss of too much body fluid can make it dangerous for you to take this medicine. What side effects may I notice from receiving this medicine? Side effects that you should report to your doctor or health care professional as soon as possible: -allergic reactions like skin rash, itching or hives, swelling of the face, lips, or tongue -breathing problems -changes in hearing -changes in vision -fast, irregular heartbeat -feeling faint or lightheaded, falls -pain, tingling, numbness in the hands or feet -right upper belly pain -seizures -swelling of the ankles, feet, hands -unusual bleeding or bruising -unusually weak or tired -vomiting -yellowing of the eyes or skin Side effects that usually do not require medical attention (report to your doctor or health care professional if they continue or are bothersome): -changes in emotions or moods -constipation -diarrhea -loss of appetite -headache -irritation at site where injected -nausea This list may not describe all possible side effects. Call your doctor for medical advice about side effects. You may report side effects to FDA at 1-800-FDA-1088. Where should I keep my medicine? This drug is given in a hospital or clinic and will not be stored at home. NOTE: This sheet is a summary. It may not cover all possible information. If you have questions about this medicine, talk to your doctor, pharmacist, or health care provider.  2015, Elsevier/Gold Standard. (2013-02-19 12:46:32)  

## 2014-09-20 ENCOUNTER — Other Ambulatory Visit: Payer: Self-pay | Admitting: *Deleted

## 2014-09-20 DIAGNOSIS — C9000 Multiple myeloma not having achieved remission: Secondary | ICD-10-CM

## 2014-09-23 ENCOUNTER — Other Ambulatory Visit (HOSPITAL_BASED_OUTPATIENT_CLINIC_OR_DEPARTMENT_OTHER): Payer: 59

## 2014-09-23 ENCOUNTER — Ambulatory Visit: Payer: 59

## 2014-09-23 ENCOUNTER — Ambulatory Visit (HOSPITAL_BASED_OUTPATIENT_CLINIC_OR_DEPARTMENT_OTHER): Payer: 59 | Admitting: Hematology & Oncology

## 2014-09-23 ENCOUNTER — Encounter: Payer: Self-pay | Admitting: Hematology & Oncology

## 2014-09-23 ENCOUNTER — Ambulatory Visit (HOSPITAL_BASED_OUTPATIENT_CLINIC_OR_DEPARTMENT_OTHER): Payer: 59

## 2014-09-23 ENCOUNTER — Ambulatory Visit: Payer: 59 | Admitting: Hematology & Oncology

## 2014-09-23 ENCOUNTER — Other Ambulatory Visit: Payer: 59 | Admitting: Lab

## 2014-09-23 VITALS — BP 116/59 | HR 78 | Temp 97.4°F | Resp 14 | Ht 64.0 in | Wt 177.0 lb

## 2014-09-23 DIAGNOSIS — C9000 Multiple myeloma not having achieved remission: Secondary | ICD-10-CM

## 2014-09-23 DIAGNOSIS — Z5112 Encounter for antineoplastic immunotherapy: Secondary | ICD-10-CM | POA: Diagnosis not present

## 2014-09-23 LAB — CBC WITH DIFFERENTIAL (CANCER CENTER ONLY)
BASO#: 0 10*3/uL (ref 0.0–0.2)
BASO%: 0.4 % (ref 0.0–2.0)
EOS ABS: 0.1 10*3/uL (ref 0.0–0.5)
EOS%: 3.6 % (ref 0.0–7.0)
HCT: 29.6 % — ABNORMAL LOW (ref 34.8–46.6)
HGB: 10 g/dL — ABNORMAL LOW (ref 11.6–15.9)
LYMPH#: 0.8 10*3/uL — ABNORMAL LOW (ref 0.9–3.3)
LYMPH%: 27.4 % (ref 14.0–48.0)
MCH: 30.5 pg (ref 26.0–34.0)
MCHC: 33.8 g/dL (ref 32.0–36.0)
MCV: 90 fL (ref 81–101)
MONO#: 0.5 10*3/uL (ref 0.1–0.9)
MONO%: 17.7 % — ABNORMAL HIGH (ref 0.0–13.0)
NEUT%: 50.9 % (ref 39.6–80.0)
NEUTROS ABS: 1.4 10*3/uL — AB (ref 1.5–6.5)
PLATELETS: 119 10*3/uL — AB (ref 145–400)
RBC: 3.28 10*6/uL — AB (ref 3.70–5.32)
RDW: 15.6 % (ref 11.1–15.7)
WBC: 2.8 10*3/uL — AB (ref 3.9–10.0)

## 2014-09-23 LAB — CMP (CANCER CENTER ONLY)
ALK PHOS: 45 U/L (ref 26–84)
ALT: 49 U/L — AB (ref 10–47)
AST: 43 U/L — AB (ref 11–38)
Albumin: 3.9 g/dL (ref 3.3–5.5)
BILIRUBIN TOTAL: 1 mg/dL (ref 0.20–1.60)
BUN, Bld: 17 mg/dL (ref 7–22)
CO2: 30 meq/L (ref 18–33)
Calcium: 9.7 mg/dL (ref 8.0–10.3)
Chloride: 100 mEq/L (ref 98–108)
Creat: 1.2 mg/dl (ref 0.6–1.2)
GLUCOSE: 92 mg/dL (ref 73–118)
POTASSIUM: 3.3 meq/L (ref 3.3–4.7)
Sodium: 141 mEq/L (ref 128–145)
Total Protein: 7.6 g/dL (ref 6.4–8.1)

## 2014-09-23 MED ORDER — ONDANSETRON HCL 8 MG PO TABS
ORAL_TABLET | ORAL | Status: AC
  Filled 2014-09-23: qty 1

## 2014-09-23 MED ORDER — ONDANSETRON HCL 8 MG PO TABS
8.0000 mg | ORAL_TABLET | Freq: Once | ORAL | Status: AC
  Administered 2014-09-23: 8 mg via ORAL

## 2014-09-23 MED ORDER — BORTEZOMIB CHEMO SQ INJECTION 3.5 MG (2.5MG/ML)
1.3000 mg/m2 | Freq: Once | INTRAMUSCULAR | Status: AC
  Administered 2014-09-23: 2.75 mg via SUBCUTANEOUS
  Filled 2014-09-23: qty 2.75

## 2014-09-23 NOTE — Patient Instructions (Signed)
Bortezomib injection What is this medicine? BORTEZOMIB (bor TEZ oh mib) is a chemotherapy drug. It slows the growth of cancer cells. This medicine is used to treat multiple myeloma, and certain lymphomas, such as mantle-cell lymphoma. This medicine may be used for other purposes; ask your health care provider or pharmacist if you have questions. COMMON BRAND NAME(S): Velcade What should I tell my health care provider before I take this medicine? They need to know if you have any of these conditions: -diabetes -heart disease -irregular heartbeat -liver disease -on hemodialysis -low blood counts, like low white blood cells, platelets, or hemoglobin -peripheral neuropathy -taking medicine for blood pressure -an unusual or allergic reaction to bortezomib, mannitol, boron, other medicines, foods, dyes, or preservatives -pregnant or trying to get pregnant -breast-feeding How should I use this medicine? This medicine is for injection into a vein or for injection under the skin. It is given by a health care professional in a hospital or clinic setting. Talk to your pediatrician regarding the use of this medicine in children. Special care may be needed. Overdosage: If you think you have taken too much of this medicine contact a poison control center or emergency room at once. NOTE: This medicine is only for you. Do not share this medicine with others. What if I miss a dose? It is important not to miss your dose. Call your doctor or health care professional if you are unable to keep an appointment. What may interact with this medicine? This medicine may interact with the following medications: -ketoconazole -rifampin -ritonavir -St. John's Wort This list may not describe all possible interactions. Give your health care provider a list of all the medicines, herbs, non-prescription drugs, or dietary supplements you use. Also tell them if you smoke, drink alcohol, or use illegal drugs. Some items  may interact with your medicine. What should I watch for while using this medicine? Visit your doctor for checks on your progress. This drug may make you feel generally unwell. This is not uncommon, as chemotherapy can affect healthy cells as well as cancer cells. Report any side effects. Continue your course of treatment even though you feel ill unless your doctor tells you to stop. You may get drowsy or dizzy. Do not drive, use machinery, or do anything that needs mental alertness until you know how this medicine affects you. Do not stand or sit up quickly, especially if you are an older patient. This reduces the risk of dizzy or fainting spells. In some cases, you may be given additional medicines to help with side effects. Follow all directions for their use. Call your doctor or health care professional for advice if you get a fever, chills or sore throat, or other symptoms of a cold or flu. Do not treat yourself. This drug decreases your body's ability to fight infections. Try to avoid being around people who are sick. This medicine may increase your risk to bruise or bleed. Call your doctor or health care professional if you notice any unusual bleeding. You may need blood work done while you are taking this medicine. In some patients, this medicine may cause a serious brain infection that may cause death. If you have any problems seeing, thinking, speaking, walking, or standing, tell your doctor right away. If you cannot reach your doctor, urgently seek other source of medical care. Do not become pregnant while taking this medicine. Women should inform their doctor if they wish to become pregnant or think they might be pregnant. There is   a potential for serious side effects to an unborn child. Talk to your health care professional or pharmacist for more information. Do not breast-feed an infant while taking this medicine. Check with your doctor or health care professional if you get an attack of  severe diarrhea, nausea and vomiting, or if you sweat a lot. The loss of too much body fluid can make it dangerous for you to take this medicine. What side effects may I notice from receiving this medicine? Side effects that you should report to your doctor or health care professional as soon as possible: -allergic reactions like skin rash, itching or hives, swelling of the face, lips, or tongue -breathing problems -changes in hearing -changes in vision -fast, irregular heartbeat -feeling faint or lightheaded, falls -pain, tingling, numbness in the hands or feet -right upper belly pain -seizures -swelling of the ankles, feet, hands -unusual bleeding or bruising -unusually weak or tired -vomiting -yellowing of the eyes or skin Side effects that usually do not require medical attention (report to your doctor or health care professional if they continue or are bothersome): -changes in emotions or moods -constipation -diarrhea -loss of appetite -headache -irritation at site where injected -nausea This list may not describe all possible side effects. Call your doctor for medical advice about side effects. You may report side effects to FDA at 1-800-FDA-1088. Where should I keep my medicine? This drug is given in a hospital or clinic and will not be stored at home. NOTE: This sheet is a summary. It may not cover all possible information. If you have questions about this medicine, talk to your doctor, pharmacist, or health care provider.  2015, Elsevier/Gold Standard. (2013-02-19 12:46:32)  

## 2014-09-23 NOTE — Progress Notes (Signed)
Hematology and Oncology Follow Up Visit  Barbara Cook [id number] 1955-09-01 59 y.o. 09/23/2014   Principle Diagnosis:  IgG kappa myeloma   Current Therapy:     Velcade/Revlimid/Decadron   Zometa 4 mg IV every month     Interim History:  Ms.  Cook is back for a follow-up. She is doing well. The kyphoplasty really has helped her.  Her last M spike was 1.1 g/dL. This is holding steady. Her IgG level was 189 0 mg/dL. This is up just a little. Her Kappa Lightchain was 3.37 mg/dL.  She had a good Mother's Day. She had a good birthday.  She is afraid to go outside. I told her that she can go outside and not have any problem with her immune system. She is worried about the  Zika virus. I told her that this is not possible up in [place]. Hopefully, she will do this.  She also wants know she can have her smoothies. I told her that she have anything she wants to eat or drink.  She's had no leg swelling. She's had no change in bowel or bladder habits for shows little bit of diarrhea with the Velcade.   Currently, her performance status is ECOG 1    Medications:  Current outpatient prescriptions:  .  acetaminophen (TYLENOL) 500 MG tablet, Take 500 mg by mouth every 6 (six) hours as needed for mild pain or headache., Disp: , Rfl:  .  aspirin 325 MG tablet, Take 325 mg by mouth daily., Disp: , Rfl:  .  belladona alk-PHENObarbital (DONNATAL) 16.2 MG tablet, Take 1-2 pills, Every 8 hours AS NEEDED, for abdominal spasms., Disp: 60 tablet, Rfl: 1 .  bortezomib IV (VELCADE) 3.5 MG injection, Inject 2.75 mg into the vein every 7 (seven) days., Disp: , Rfl:  .  Cholecalciferol (VITAMIN D) 1000 UNITS capsule, Take 2,000 Units by mouth 2 (two) times daily. 2 IN AM AND 2 IN PM, Disp: , Rfl:  .  dexamethasone (DECADRON) 4 MG tablet, Take 5 tablets once a week with food., Disp: 80 tablet, Rfl: 4 .  lenalidomide (REVLIMID) 25 MG capsule, Take 1 capsule at bedtime daily for 21 days and then  off for 7 days. [id number], Disp: 21 capsule, Rfl: 6 .  Magnesium 250 MG TABS, Take 2 tablets by mouth daily. Takes 500 mg daily, Disp: , Rfl:  .  Multiple Vitamin (MULTIVITAMIN) tablet, Take 1 tablet by mouth every evening. , Disp: , Rfl:  .  ondansetron (ZOFRAN) 8 MG tablet, Take 67m twice a day. Start the day after chemo for 2 days. Then take for nausea and vomiting twice a day as needed., Disp: 30 tablet, Rfl: 3 .  pantoprazole (PROTONIX) 40 MG tablet, Take 1 tablet (40 mg total) by mouth 2 (two) times daily., Disp: 60 tablet, Rfl: 6 .  potassium chloride SA (K-DUR,KLOR-CON) 20 MEQ tablet, Take 1 tablet (20 mEq total) by mouth daily., Disp: 30 tablet, Rfl: 3 .  senna-docusate (SENOKOT-S) 8.6-50 MG per tablet, Take 4 tablets by mouth 2 (two) times daily., Disp: , Rfl:  .  sucralfate (CARAFATE) 1 GM/10ML suspension, Take 10 mLs (1 g total) by mouth 4 (four) times daily -  with meals and at bedtime., Disp: 420 mL, Rfl: 0 .  sulfamethoxazole-trimethoprim (BACTRIM DS,SEPTRA DS) 800-160 MG per tablet, Take 1 tablet by mouth 2 (two) times daily. For 3 days, Disp: 6 tablet, Rfl: 0 .  torsemide (DEMADEX) 20 MG tablet, Take 1 tablet (20 mg  total) by mouth daily as needed., Disp: 30 tablet, Rfl: 6  Allergies:  Allergies  Allergen Reactions  . Codeine Palpitations    Past Medical History, Surgical history, Social history, and Family History were reviewed and updated.  Review of Systems: As above  Physical Exam:  height is 5' 4" (1.626 m) and weight is 177 lb (80.287 kg). Her oral temperature is 97.4 F (36.3 C). Her blood pressure is 116/59 and her pulse is 78. Her respiration is 14.   Well-developed and well-nourished African American female. Head and neck exam shows no ocular or oral lesions. There are no palpable cervical or supraclavicular lymph nodes. Lungs are clear. Cardiac exam regular rate and rhythm with no murmurs, rubs or bruits. Abdomen is soft. She is mildly obese. She is no  palpable liver or spleen tip. Back exam shows no tenderness over the spine, ribs or hips. Extremity shows no clubbing, cyanosis or edema. Skin exam shows no rashes, ecchymoses or petechia. Neurological exam is nonfocal.  Lab Results  Component Value Date   WBC 2.8* 09/23/2014   HGB 10.0* 09/23/2014   HCT 29.6* 09/23/2014   MCV 90 09/23/2014   PLT 119* 09/23/2014     Chemistry      Component Value Date/Time   NA 141 09/23/2014 0802   NA 137 08/22/2014 0956   K 3.3 09/23/2014 0802   K 3.8 08/22/2014 0956   CL 100 09/23/2014 0802   CL 99 08/22/2014 0956   CO2 30 09/23/2014 0802   CO2 30 08/22/2014 0956   BUN 17 09/23/2014 0802   BUN 16 08/22/2014 0956   CREATININE 1.2 09/23/2014 0802   CREATININE 0.95 08/22/2014 0956      Component Value Date/Time   CALCIUM 9.7 09/23/2014 0802   CALCIUM 9.2 08/22/2014 0956   ALKPHOS 45 09/23/2014 0802   ALKPHOS 47 06/03/2014 0122   AST 43* 09/23/2014 0802   AST 40* 06/03/2014 0122   ALT 49* 09/23/2014 0802   ALT 43* 06/03/2014 0122   BILITOT 1.00 09/23/2014 0802   BILITOT 0.5 06/03/2014 0122         Impression and Plan: Barbara Cook is 59 year old African-American female with IgG kappa myeloma.   She really is doing quite well. Her M- spike keeps coming down slowly but surely. The kyphoplasty really helped her. Her pain really is minimal.  Her myeloma studies have been looking pretty good. Her last M spike was 1.1 g/dL.  As long as we are seeing a response, we will continue her on therapy.  I'll plan to treat her today. Then she will have a break next week and then will start the next cycle after Memorial Day.     [name], MD 5/16/20168:46 AM

## 2014-09-26 ENCOUNTER — Other Ambulatory Visit: Payer: Self-pay | Admitting: *Deleted

## 2014-09-26 DIAGNOSIS — C9000 Multiple myeloma not having achieved remission: Secondary | ICD-10-CM

## 2014-09-26 MED ORDER — LENALIDOMIDE 25 MG PO CAPS: ORAL_CAPSULE | ORAL | Status: DC

## 2014-10-03 ENCOUNTER — Other Ambulatory Visit: Payer: Self-pay

## 2014-10-03 DIAGNOSIS — C9000 Multiple myeloma not having achieved remission: Secondary | ICD-10-CM

## 2014-10-03 MED ORDER — LENALIDOMIDE 25 MG PO CAPS: ORAL_CAPSULE | ORAL | Status: DC

## 2014-10-08 ENCOUNTER — Other Ambulatory Visit (HOSPITAL_BASED_OUTPATIENT_CLINIC_OR_DEPARTMENT_OTHER): Payer: 59

## 2014-10-08 ENCOUNTER — Encounter: Payer: Self-pay | Admitting: Hematology & Oncology

## 2014-10-08 ENCOUNTER — Ambulatory Visit (HOSPITAL_BASED_OUTPATIENT_CLINIC_OR_DEPARTMENT_OTHER): Payer: 59 | Admitting: Hematology & Oncology

## 2014-10-08 ENCOUNTER — Ambulatory Visit (HOSPITAL_BASED_OUTPATIENT_CLINIC_OR_DEPARTMENT_OTHER): Payer: 59

## 2014-10-08 VITALS — BP 101/47 | HR 82 | Temp 97.9°F | Resp 16 | Wt 180.0 lb

## 2014-10-08 DIAGNOSIS — Z5112 Encounter for antineoplastic immunotherapy: Secondary | ICD-10-CM

## 2014-10-08 DIAGNOSIS — C9000 Multiple myeloma not having achieved remission: Secondary | ICD-10-CM

## 2014-10-08 LAB — CBC WITH DIFFERENTIAL (CANCER CENTER ONLY)
BASO#: 0 10*3/uL (ref 0.0–0.2)
BASO%: 0.9 % (ref 0.0–2.0)
EOS%: 1.8 % (ref 0.0–7.0)
Eosinophils Absolute: 0.1 10*3/uL (ref 0.0–0.5)
HCT: 27.1 % — ABNORMAL LOW (ref 34.8–46.6)
HEMOGLOBIN: 9.2 g/dL — AB (ref 11.6–15.9)
LYMPH#: 0.9 10*3/uL (ref 0.9–3.3)
LYMPH%: 28.7 % (ref 14.0–48.0)
MCH: 31 pg (ref 26.0–34.0)
MCHC: 33.9 g/dL (ref 32.0–36.0)
MCV: 91 fL (ref 81–101)
MONO#: 0.7 10*3/uL (ref 0.1–0.9)
MONO%: 22.3 % — ABNORMAL HIGH (ref 0.0–13.0)
NEUT%: 46.3 % (ref 39.6–80.0)
NEUTROS ABS: 1.5 10*3/uL (ref 1.5–6.5)
Platelets: 211 10*3/uL (ref 145–400)
RBC: 2.97 10*6/uL — ABNORMAL LOW (ref 3.70–5.32)
RDW: 15.7 % (ref 11.1–15.7)
WBC: 3.3 10*3/uL — AB (ref 3.9–10.0)

## 2014-10-08 LAB — CMP (CANCER CENTER ONLY)
ALBUMIN: 3.7 g/dL (ref 3.3–5.5)
ALT(SGPT): 41 U/L (ref 10–47)
AST: 32 U/L (ref 11–38)
Alkaline Phosphatase: 48 U/L (ref 26–84)
BUN: 21 mg/dL (ref 7–22)
CALCIUM: 9.5 mg/dL (ref 8.0–10.3)
CHLORIDE: 102 meq/L (ref 98–108)
CO2: 30 meq/L (ref 18–33)
CREATININE: 1 mg/dL (ref 0.6–1.2)
Glucose, Bld: 90 mg/dL (ref 73–118)
POTASSIUM: 3.2 meq/L — AB (ref 3.3–4.7)
Sodium: 139 mEq/L (ref 128–145)
Total Bilirubin: 0.7 mg/dl (ref 0.20–1.60)
Total Protein: 7 g/dL (ref 6.4–8.1)

## 2014-10-08 MED ORDER — BORTEZOMIB CHEMO SQ INJECTION 3.5 MG (2.5MG/ML)
1.3000 mg/m2 | Freq: Once | INTRAMUSCULAR | Status: AC
  Administered 2014-10-08: 2.75 mg via SUBCUTANEOUS
  Filled 2014-10-08: qty 2.75

## 2014-10-08 MED ORDER — ONDANSETRON HCL 8 MG PO TABS
ORAL_TABLET | ORAL | Status: AC
  Filled 2014-10-08: qty 1

## 2014-10-08 MED ORDER — ONDANSETRON HCL 8 MG PO TABS
8.0000 mg | ORAL_TABLET | Freq: Once | ORAL | Status: AC
  Administered 2014-10-08: 8 mg via ORAL

## 2014-10-08 NOTE — Patient Instructions (Signed)
[facility] Discharge Instructions for Patients Receiving Chemotherapy  Today you received the following chemotherapy agents Velcade  To help prevent nausea and vomiting after your treatment, we encourage you to take your nausea medication    If you develop nausea and vomiting that is not controlled by your nausea medication, call the clinic.   BELOW ARE SYMPTOMS THAT SHOULD BE REPORTED IMMEDIATELY:  *FEVER GREATER THAN 100.5 F  *CHILLS WITH OR WITHOUT FEVER  NAUSEA AND VOMITING THAT IS NOT CONTROLLED WITH YOUR NAUSEA MEDICATION  *UNUSUAL SHORTNESS OF BREATH  *UNUSUAL BRUISING OR BLEEDING  TENDERNESS IN MOUTH AND THROAT WITH OR WITHOUT PRESENCE OF ULCERS  *URINARY PROBLEMS  *BOWEL PROBLEMS  UNUSUAL RASH Items with * indicate a potential emergency and should be followed up as soon as possible.  Feel free to call the clinic you have any questions or concerns. The clinic phone number is [phone].  Please show the CHEMO ALERT CARD at check-in to the Emergency Department and triage nurse.   

## 2014-10-09 NOTE — Progress Notes (Signed)
Hematology and Oncology Follow Up Visit  Barbara Cook [id number] June 01, 1955 59 y.o. 10/09/2014   Principle Diagnosis:  IgG kappa myeloma   Current Therapy:     Velcade/Revlimid/Decadron   Zometa 4 mg IV every month     Interim History:  Ms.  Cook is back for a follow-up. She is doing well. The kyphoplasty really has helped her.  Her last M spike was 1.1 g/dL. This is holding steady. Her IgG level was 1890 mg/dL. This is up just a little. Her Kappa Light chain was 3.37 mg/dL.  She had a good Memorial Day. We gave her extra week off.  She is doing more. More last saw her, she is worried by getting an infection. She is worried about the Zika virus. I reassured her that it would be okay for her to go outside. She is now doing more activities which is nice.  She also wants know she can have her smoothies. I told her that she have anything she wants to eat or drink.  She's had no leg swelling. She's had no change in bowel or bladder habits for shows little bit of diarrhea with the Velcade.   Currently, her performance status is ECOG 1    Medications:  Current outpatient prescriptions:  .  acetaminophen (TYLENOL) 500 MG tablet, Take 500 mg by mouth every 6 (six) hours as needed for mild pain or headache., Disp: , Rfl:  .  aspirin 325 MG tablet, Take 325 mg by mouth daily., Disp: , Rfl:  .  belladona alk-PHENObarbital (DONNATAL) 16.2 MG tablet, Take 1-2 pills, Every 8 hours AS NEEDED, for abdominal spasms., Disp: 60 tablet, Rfl: 1 .  bortezomib IV (VELCADE) 3.5 MG injection, Inject 2.75 mg into the vein every 7 (seven) days., Disp: , Rfl:  .  Cholecalciferol (VITAMIN D) 1000 UNITS capsule, Take 2,000 Units by mouth 2 (two) times daily. 2 IN AM AND 2 IN PM, Disp: , Rfl:  .  dexamethasone (DECADRON) 4 MG tablet, Take 5 tablets once a week with food., Disp: 80 tablet, Rfl: 4 .  lenalidomide (REVLIMID) 25 MG capsule, Take 1 capsule at bedtime daily for 21 days and then off for 7 days.  [id number], Disp: 21 capsule, Rfl: 0 .  Magnesium 250 MG TABS, Take 2 tablets by mouth daily. Takes 500 mg daily, Disp: , Rfl:  .  Multiple Vitamin (MULTIVITAMIN) tablet, Take 1 tablet by mouth every evening. , Disp: , Rfl:  .  ondansetron (ZOFRAN) 8 MG tablet, Take 12m twice a day. Start the day after chemo for 2 days. Then take for nausea and vomiting twice a day as needed., Disp: 30 tablet, Rfl: 3 .  pantoprazole (PROTONIX) 40 MG tablet, Take 1 tablet (40 mg total) by mouth 2 (two) times daily., Disp: 60 tablet, Rfl: 6 .  potassium chloride SA (K-DUR,KLOR-CON) 20 MEQ tablet, Take 1 tablet (20 mEq total) by mouth daily., Disp: 30 tablet, Rfl: 3 .  senna-docusate (SENOKOT-S) 8.6-50 MG per tablet, Take 4 tablets by mouth 2 (two) times daily., Disp: , Rfl:  .  sucralfate (CARAFATE) 1 GM/10ML suspension, Take 10 mLs (1 g total) by mouth 4 (four) times daily -  with meals and at bedtime., Disp: 420 mL, Rfl: 0 .  sulfamethoxazole-trimethoprim (BACTRIM DS,SEPTRA DS) 800-160 MG per tablet, Take 1 tablet by mouth 2 (two) times daily. For 3 days, Disp: 6 tablet, Rfl: 0 .  torsemide (DEMADEX) 20 MG tablet, Take 1 tablet (20 mg total) by mouth  daily as needed., Disp: 30 tablet, Rfl: 6  Allergies:  Allergies  Allergen Reactions  . Codeine Palpitations    Past Medical History, Surgical history, Social history, and Family History were reviewed and updated.  Review of Systems: As above  Physical Exam:  weight is 180 lb (81.647 kg). Her oral temperature is 97.9 F (36.6 C). Her blood pressure is 101/47 and her pulse is 82. Her respiration is 16.   Well-developed and well-nourished African American female. Head and neck exam shows no ocular or oral lesions. There are no palpable cervical or supraclavicular lymph nodes. Lungs are clear. Cardiac exam regular rate and rhythm with no murmurs, rubs or bruits. Abdomen is soft. She is mildly obese. She is no palpable liver or spleen tip. Back exam shows no  tenderness over the spine, ribs or hips. Extremity shows no clubbing, cyanosis or edema. Skin exam shows no rashes, ecchymoses or petechia. Neurological exam is nonfocal.  Lab Results  Component Value Date   WBC 3.3* 10/08/2014   HGB 9.2* 10/08/2014   HCT 27.1* 10/08/2014   MCV 91 10/08/2014   PLT 211 10/08/2014     Chemistry      Component Value Date/Time   NA 139 10/08/2014 0815   NA 137 08/22/2014 0956   K 3.2* 10/08/2014 0815   K 3.8 08/22/2014 0956   CL 102 10/08/2014 0815   CL 99 08/22/2014 0956   CO2 30 10/08/2014 0815   CO2 30 08/22/2014 0956   BUN 21 10/08/2014 0815   BUN 16 08/22/2014 0956   CREATININE 1.0 10/08/2014 0815   CREATININE 0.95 08/22/2014 0956      Component Value Date/Time   CALCIUM 9.5 10/08/2014 0815   CALCIUM 9.2 08/22/2014 0956   ALKPHOS 48 10/08/2014 0815   ALKPHOS 47 06/03/2014 0122   AST 32 10/08/2014 0815   AST 40* 06/03/2014 0122   ALT 41 10/08/2014 0815   ALT 43* 06/03/2014 0122   BILITOT 0.70 10/08/2014 0815   BILITOT 0.5 06/03/2014 0122         Impression and Plan: Barbara Cook is 59 year old African-American female with IgG kappa myeloma.   She really is doing quite well. Her M- spike has stabilized a little bit. We'll see what it is now.  I'm glad that she got the extra week off. I did this really helped her out.   The kyphoplasty really helped her. Her pain really is minimal.  As long as we are seeing a response, we will continue her on therapy.  I'll plan to treat her today.   We will then get her back in one month.     Barbara Napoleon, MD 6/1/20167:34 AM

## 2014-10-10 ENCOUNTER — Ambulatory Visit: Payer: 59

## 2014-10-10 LAB — SPEP & IFE WITH QIG
Abnormal Protein Band1: 0.8 g/dL
Albumin ELP: 3.7 g/dL — ABNORMAL LOW (ref 3.8–4.8)
Alpha-1-Globulin: 0.3 g/dL (ref 0.2–0.3)
Alpha-2-Globulin: 0.7 g/dL (ref 0.5–0.9)
Beta 2: 0.3 g/dL (ref 0.2–0.5)
Beta Globulin: 0.4 g/dL (ref 0.4–0.6)
Gamma Globulin: 1.4 g/dL (ref 0.8–1.7)
IGA: 38 mg/dL — AB (ref 69–380)
IGG (IMMUNOGLOBIN G), SERUM: 1630 mg/dL (ref 690–1700)
IGM, SERUM: 14 mg/dL — AB (ref 52–322)
Total Protein, Serum Electrophoresis: 6.6 g/dL (ref 6.1–8.1)

## 2014-10-10 LAB — KAPPA/LAMBDA LIGHT CHAINS
Kappa free light chain: 2.58 mg/dL — ABNORMAL HIGH (ref 0.33–1.94)
Kappa:Lambda Ratio: 2.39 — ABNORMAL HIGH (ref 0.26–1.65)
LAMBDA FREE LGHT CHN: 1.08 mg/dL (ref 0.57–2.63)

## 2014-10-11 ENCOUNTER — Ambulatory Visit (HOSPITAL_BASED_OUTPATIENT_CLINIC_OR_DEPARTMENT_OTHER): Payer: 59

## 2014-10-11 ENCOUNTER — Telehealth: Payer: Self-pay | Admitting: *Deleted

## 2014-10-11 VITALS — BP 109/44 | HR 68 | Temp 97.8°F | Resp 16 | Ht 64.0 in | Wt 180.0 lb

## 2014-10-11 DIAGNOSIS — C9000 Multiple myeloma not having achieved remission: Secondary | ICD-10-CM | POA: Diagnosis not present

## 2014-10-11 MED ORDER — SODIUM CHLORIDE 0.9 % IV SOLN
Freq: Once | INTRAVENOUS | Status: AC
  Administered 2014-10-11: 09:00:00 via INTRAVENOUS

## 2014-10-11 MED ORDER — ZOLEDRONIC ACID 4 MG/100ML IV SOLN
4.0000 mg | Freq: Once | INTRAVENOUS | Status: AC
  Administered 2014-10-11: 4 mg via INTRAVENOUS
  Filled 2014-10-11: qty 100

## 2014-10-11 NOTE — Patient Instructions (Signed)

## 2014-10-11 NOTE — Telephone Encounter (Addendum)
Patient in office today. Told patient and husband in person  ----- Message from Barbara Napoleon, MD sent at 10/10/2014  2:12 PM EDT ----- Call and tell her that the myeloma protein is now 0.8. She continues to do quite well. Thanks

## 2014-10-14 ENCOUNTER — Other Ambulatory Visit: Payer: Self-pay | Admitting: Nurse Practitioner

## 2014-10-14 ENCOUNTER — Ambulatory Visit (HOSPITAL_BASED_OUTPATIENT_CLINIC_OR_DEPARTMENT_OTHER): Payer: 59

## 2014-10-14 ENCOUNTER — Ambulatory Visit: Payer: 59

## 2014-10-14 ENCOUNTER — Other Ambulatory Visit (HOSPITAL_BASED_OUTPATIENT_CLINIC_OR_DEPARTMENT_OTHER): Payer: 59

## 2014-10-14 ENCOUNTER — Other Ambulatory Visit: Payer: 59

## 2014-10-14 VITALS — BP 105/55 | HR 83 | Temp 98.1°F | Resp 18

## 2014-10-14 DIAGNOSIS — C9000 Multiple myeloma not having achieved remission: Secondary | ICD-10-CM

## 2014-10-14 DIAGNOSIS — Z5112 Encounter for antineoplastic immunotherapy: Secondary | ICD-10-CM | POA: Diagnosis not present

## 2014-10-14 LAB — CBC WITH DIFFERENTIAL (CANCER CENTER ONLY)
BASO#: 0 10*3/uL (ref 0.0–0.2)
BASO%: 0.2 % (ref 0.0–2.0)
EOS%: 3.2 % (ref 0.0–7.0)
Eosinophils Absolute: 0.1 10*3/uL (ref 0.0–0.5)
HEMATOCRIT: 27.1 % — AB (ref 34.8–46.6)
HEMOGLOBIN: 9.2 g/dL — AB (ref 11.6–15.9)
LYMPH#: 1 10*3/uL (ref 0.9–3.3)
LYMPH%: 23.6 % (ref 14.0–48.0)
MCH: 31 pg (ref 26.0–34.0)
MCHC: 33.9 g/dL (ref 32.0–36.0)
MCV: 91 fL (ref 81–101)
MONO#: 0.3 10*3/uL (ref 0.1–0.9)
MONO%: 7.5 % (ref 0.0–13.0)
NEUT%: 65.5 % (ref 39.6–80.0)
NEUTROS ABS: 2.6 10*3/uL (ref 1.5–6.5)
Platelets: 123 10*3/uL — ABNORMAL LOW (ref 145–400)
RBC: 2.97 10*6/uL — AB (ref 3.70–5.32)
RDW: 15.6 % (ref 11.1–15.7)
WBC: 4 10*3/uL (ref 3.9–10.0)

## 2014-10-14 LAB — BASIC METABOLIC PANEL - CANCER CENTER ONLY
BUN: 13 mg/dL (ref 7–22)
CO2: 33 mEq/L (ref 18–33)
Calcium: 8.8 mg/dL (ref 8.0–10.3)
Chloride: 99 mEq/L (ref 98–108)
Creat: 1 mg/dl (ref 0.6–1.2)
Glucose, Bld: 86 mg/dL (ref 73–118)
Potassium: 3.3 mEq/L (ref 3.3–4.7)
SODIUM: 142 meq/L (ref 128–145)

## 2014-10-14 MED ORDER — ONDANSETRON HCL 8 MG PO TABS
ORAL_TABLET | ORAL | Status: AC
  Filled 2014-10-14: qty 1

## 2014-10-14 MED ORDER — BORTEZOMIB CHEMO SQ INJECTION 3.5 MG (2.5MG/ML)
1.3000 mg/m2 | Freq: Once | INTRAMUSCULAR | Status: AC
  Administered 2014-10-14: 2.75 mg via SUBCUTANEOUS
  Filled 2014-10-14: qty 2.75

## 2014-10-14 MED ORDER — ONDANSETRON HCL 8 MG PO TABS
8.0000 mg | ORAL_TABLET | Freq: Once | ORAL | Status: AC
  Administered 2014-10-14: 8 mg via ORAL

## 2014-10-14 NOTE — Patient Instructions (Signed)
Bortezomib injection What is this medicine? BORTEZOMIB (bor TEZ oh mib) is a chemotherapy drug. It slows the growth of cancer cells. This medicine is used to treat multiple myeloma, and certain lymphomas, such as mantle-cell lymphoma. This medicine may be used for other purposes; ask your health care provider or pharmacist if you have questions. COMMON BRAND NAME(S): Velcade What should I tell my health care provider before I take this medicine? They need to know if you have any of these conditions: -diabetes -heart disease -irregular heartbeat -liver disease -on hemodialysis -low blood counts, like low white blood cells, platelets, or hemoglobin -peripheral neuropathy -taking medicine for blood pressure -an unusual or allergic reaction to bortezomib, mannitol, boron, other medicines, foods, dyes, or preservatives -pregnant or trying to get pregnant -breast-feeding How should I use this medicine? This medicine is for injection into a vein or for injection under the skin. It is given by a health care professional in a hospital or clinic setting. Talk to your pediatrician regarding the use of this medicine in children. Special care may be needed. Overdosage: If you think you have taken too much of this medicine contact a poison control center or emergency room at once. NOTE: This medicine is only for you. Do not share this medicine with others. What if I miss a dose? It is important not to miss your dose. Call your doctor or health care professional if you are unable to keep an appointment. What may interact with this medicine? This medicine may interact with the following medications: -ketoconazole -rifampin -ritonavir -St. John's Wort This list may not describe all possible interactions. Give your health care provider a list of all the medicines, herbs, non-prescription drugs, or dietary supplements you use. Also tell them if you smoke, drink alcohol, or use illegal drugs. Some items  may interact with your medicine. What should I watch for while using this medicine? Visit your doctor for checks on your progress. This drug may make you feel generally unwell. This is not uncommon, as chemotherapy can affect healthy cells as well as cancer cells. Report any side effects. Continue your course of treatment even though you feel ill unless your doctor tells you to stop. You may get drowsy or dizzy. Do not drive, use machinery, or do anything that needs mental alertness until you know how this medicine affects you. Do not stand or sit up quickly, especially if you are an older patient. This reduces the risk of dizzy or fainting spells. In some cases, you may be given additional medicines to help with side effects. Follow all directions for their use. Call your doctor or health care professional for advice if you get a fever, chills or sore throat, or other symptoms of a cold or flu. Do not treat yourself. This drug decreases your body's ability to fight infections. Try to avoid being around people who are sick. This medicine may increase your risk to bruise or bleed. Call your doctor or health care professional if you notice any unusual bleeding. You may need blood work done while you are taking this medicine. In some patients, this medicine may cause a serious brain infection that may cause death. If you have any problems seeing, thinking, speaking, walking, or standing, tell your doctor right away. If you cannot reach your doctor, urgently seek other source of medical care. Do not become pregnant while taking this medicine. Women should inform their doctor if they wish to become pregnant or think they might be pregnant. There is  a potential for serious side effects to an unborn child. Talk to your health care professional or pharmacist for more information. Do not breast-feed an infant while taking this medicine. Check with your doctor or health care professional if you get an attack of  severe diarrhea, nausea and vomiting, or if you sweat a lot. The loss of too much body fluid can make it dangerous for you to take this medicine. What side effects may I notice from receiving this medicine? Side effects that you should report to your doctor or health care professional as soon as possible: -allergic reactions like skin rash, itching or hives, swelling of the face, lips, or tongue -breathing problems -changes in hearing -changes in vision -fast, irregular heartbeat -feeling faint or lightheaded, falls -pain, tingling, numbness in the hands or feet -right upper belly pain -seizures -swelling of the ankles, feet, hands -unusual bleeding or bruising -unusually weak or tired -vomiting -yellowing of the eyes or skin Side effects that usually do not require medical attention (report to your doctor or health care professional if they continue or are bothersome): -changes in emotions or moods -constipation -diarrhea -loss of appetite -headache -irritation at site where injected -nausea This list may not describe all possible side effects. Call your doctor for medical advice about side effects. You may report side effects to FDA at 1-800-FDA-1088. Where should I keep my medicine? This drug is given in a hospital or clinic and will not be stored at home. NOTE: This sheet is a summary. It may not cover all possible information. If you have questions about this medicine, talk to your doctor, pharmacist, or health care provider.  2015, [employer] Standard. (2013-02-19 12:46:32)   

## 2014-10-15 ENCOUNTER — Other Ambulatory Visit: Payer: 59

## 2014-10-15 ENCOUNTER — Inpatient Hospital Stay: Payer: 59

## 2014-10-21 ENCOUNTER — Ambulatory Visit (HOSPITAL_BASED_OUTPATIENT_CLINIC_OR_DEPARTMENT_OTHER): Payer: 59

## 2014-10-21 ENCOUNTER — Other Ambulatory Visit: Payer: 59

## 2014-10-21 VITALS — BP 120/57 | HR 77 | Temp 98.1°F | Resp 18

## 2014-10-21 DIAGNOSIS — C9000 Multiple myeloma not having achieved remission: Secondary | ICD-10-CM | POA: Diagnosis not present

## 2014-10-21 DIAGNOSIS — Z5112 Encounter for antineoplastic immunotherapy: Secondary | ICD-10-CM | POA: Diagnosis not present

## 2014-10-21 MED ORDER — ONDANSETRON HCL 8 MG PO TABS
ORAL_TABLET | ORAL | Status: AC
  Filled 2014-10-21: qty 1

## 2014-10-21 MED ORDER — BORTEZOMIB CHEMO SQ INJECTION 3.5 MG (2.5MG/ML)
1.3000 mg/m2 | Freq: Once | INTRAMUSCULAR | Status: AC
  Administered 2014-10-21: 2.75 mg via SUBCUTANEOUS
  Filled 2014-10-21: qty 2.75

## 2014-10-21 MED ORDER — ONDANSETRON HCL 8 MG PO TABS
8.0000 mg | ORAL_TABLET | Freq: Once | ORAL | Status: AC
  Administered 2014-10-21: 8 mg via ORAL

## 2014-10-21 NOTE — Patient Instructions (Signed)
Bortezomib injection What is this medicine? BORTEZOMIB (bor TEZ oh mib) is a chemotherapy drug. It slows the growth of cancer cells. This medicine is used to treat multiple myeloma, and certain lymphomas, such as mantle-cell lymphoma. This medicine may be used for other purposes; ask your health care provider or pharmacist if you have questions. COMMON BRAND NAME(S): Velcade What should I tell my health care provider before I take this medicine? They need to know if you have any of these conditions: -diabetes -heart disease -irregular heartbeat -liver disease -on hemodialysis -low blood counts, like low white blood cells, platelets, or hemoglobin -peripheral neuropathy -taking medicine for blood pressure -an unusual or allergic reaction to bortezomib, mannitol, boron, other medicines, foods, dyes, or preservatives -pregnant or trying to get pregnant -breast-feeding How should I use this medicine? This medicine is for injection into a vein or for injection under the skin. It is given by a health care professional in a hospital or clinic setting. Talk to your pediatrician regarding the use of this medicine in children. Special care may be needed. Overdosage: If you think you have taken too much of this medicine contact a poison control center or emergency room at once. NOTE: This medicine is only for you. Do not share this medicine with others. What if I miss a dose? It is important not to miss your dose. Call your doctor or health care professional if you are unable to keep an appointment. What may interact with this medicine? This medicine may interact with the following medications: -ketoconazole -rifampin -ritonavir -St. John's Wort This list may not describe all possible interactions. Give your health care provider a list of all the medicines, herbs, non-prescription drugs, or dietary supplements you use. Also tell them if you smoke, drink alcohol, or use illegal drugs. Some items  may interact with your medicine. What should I watch for while using this medicine? Visit your doctor for checks on your progress. This drug may make you feel generally unwell. This is not uncommon, as chemotherapy can affect healthy cells as well as cancer cells. Report any side effects. Continue your course of treatment even though you feel ill unless your doctor tells you to stop. You may get drowsy or dizzy. Do not drive, use machinery, or do anything that needs mental alertness until you know how this medicine affects you. Do not stand or sit up quickly, especially if you are an older patient. This reduces the risk of dizzy or fainting spells. In some cases, you may be given additional medicines to help with side effects. Follow all directions for their use. Call your doctor or health care professional for advice if you get a fever, chills or sore throat, or other symptoms of a cold or flu. Do not treat yourself. This drug decreases your body's ability to fight infections. Try to avoid being around people who are sick. This medicine may increase your risk to bruise or bleed. Call your doctor or health care professional if you notice any unusual bleeding. You may need blood work done while you are taking this medicine. In some patients, this medicine may cause a serious brain infection that may cause death. If you have any problems seeing, thinking, speaking, walking, or standing, tell your doctor right away. If you cannot reach your doctor, urgently seek other source of medical care. Do not become pregnant while taking this medicine. Women should inform their doctor if they wish to become pregnant or think they might be pregnant. There is   a potential for serious side effects to an unborn child. Talk to your health care professional or pharmacist for more information. Do not breast-feed an infant while taking this medicine. Check with your doctor or health care professional if you get an attack of  severe diarrhea, nausea and vomiting, or if you sweat a lot. The loss of too much body fluid can make it dangerous for you to take this medicine. What side effects may I notice from receiving this medicine? Side effects that you should report to your doctor or health care professional as soon as possible: -allergic reactions like skin rash, itching or hives, swelling of the face, lips, or tongue -breathing problems -changes in hearing -changes in vision -fast, irregular heartbeat -feeling faint or lightheaded, falls -pain, tingling, numbness in the hands or feet -right upper belly pain -seizures -swelling of the ankles, feet, hands -unusual bleeding or bruising -unusually weak or tired -vomiting -yellowing of the eyes or skin Side effects that usually do not require medical attention (report to your doctor or health care professional if they continue or are bothersome): -changes in emotions or moods -constipation -diarrhea -loss of appetite -headache -irritation at site where injected -nausea This list may not describe all possible side effects. Call your doctor for medical advice about side effects. You may report side effects to FDA at 1-800-FDA-1088. Where should I keep my medicine? This drug is given in a hospital or clinic and will not be stored at home. NOTE: This sheet is a summary. It may not cover all possible information. If you have questions about this medicine, talk to your doctor, pharmacist, or health care provider.  2015, Elsevier/Gold Standard. (2013-02-19 12:46:32)  

## 2014-10-24 ENCOUNTER — Encounter: Payer: Self-pay | Admitting: *Deleted

## 2014-10-24 ENCOUNTER — Other Ambulatory Visit: Payer: Self-pay | Admitting: *Deleted

## 2014-10-24 DIAGNOSIS — C9000 Multiple myeloma not having achieved remission: Secondary | ICD-10-CM

## 2014-10-24 MED ORDER — LENALIDOMIDE 25 MG PO CAPS: ORAL_CAPSULE | ORAL | Status: DC

## 2014-11-04 ENCOUNTER — Ambulatory Visit (HOSPITAL_BASED_OUTPATIENT_CLINIC_OR_DEPARTMENT_OTHER): Payer: Commercial Managed Care - HMO

## 2014-11-04 ENCOUNTER — Ambulatory Visit (HOSPITAL_BASED_OUTPATIENT_CLINIC_OR_DEPARTMENT_OTHER): Payer: Commercial Managed Care - HMO | Admitting: Hematology & Oncology

## 2014-11-04 ENCOUNTER — Encounter: Payer: Self-pay | Admitting: Hematology & Oncology

## 2014-11-04 VITALS — BP 105/51 | HR 75 | Temp 97.7°F | Resp 18 | Ht 64.0 in | Wt 175.0 lb

## 2014-11-04 DIAGNOSIS — Z5112 Encounter for antineoplastic immunotherapy: Secondary | ICD-10-CM

## 2014-11-04 DIAGNOSIS — C9000 Multiple myeloma not having achieved remission: Secondary | ICD-10-CM

## 2014-11-04 LAB — CMP (CANCER CENTER ONLY)
ALK PHOS: 38 U/L (ref 26–84)
ALT: 44 U/L (ref 10–47)
AST: 35 U/L (ref 11–38)
Albumin: 3.6 g/dL (ref 3.3–5.5)
BILIRUBIN TOTAL: 1.2 mg/dL (ref 0.20–1.60)
BUN, Bld: 19 mg/dL (ref 7–22)
CO2: 30 mEq/L (ref 18–33)
CREATININE: 1.2 mg/dL (ref 0.6–1.2)
Calcium: 9.3 mg/dL (ref 8.0–10.3)
Chloride: 102 mEq/L (ref 98–108)
Glucose, Bld: 92 mg/dL (ref 73–118)
Potassium: 3.5 mEq/L (ref 3.3–4.7)
SODIUM: 140 meq/L (ref 128–145)
TOTAL PROTEIN: 7.2 g/dL (ref 6.4–8.1)

## 2014-11-04 LAB — CBC WITH DIFFERENTIAL (CANCER CENTER ONLY)
BASO#: 0 10*3/uL (ref 0.0–0.2)
BASO%: 0.6 % (ref 0.0–2.0)
EOS%: 0.6 % (ref 0.0–7.0)
Eosinophils Absolute: 0 10*3/uL (ref 0.0–0.5)
HCT: 27.5 % — ABNORMAL LOW (ref 34.8–46.6)
HGB: 9.4 g/dL — ABNORMAL LOW (ref 11.6–15.9)
LYMPH#: 0.9 10*3/uL (ref 0.9–3.3)
LYMPH%: 27.3 % (ref 14.0–48.0)
MCH: 31 pg (ref 26.0–34.0)
MCHC: 34.2 g/dL (ref 32.0–36.0)
MCV: 91 fL (ref 81–101)
MONO#: 0.7 10*3/uL (ref 0.1–0.9)
MONO%: 22.9 % — AB (ref 0.0–13.0)
NEUT#: 1.6 10*3/uL (ref 1.5–6.5)
NEUT%: 48.6 % (ref 39.6–80.0)
Platelets: 222 10*3/uL (ref 145–400)
RBC: 3.03 10*6/uL — ABNORMAL LOW (ref 3.70–5.32)
RDW: 15.6 % (ref 11.1–15.7)
WBC: 3.2 10*3/uL — AB (ref 3.9–10.0)

## 2014-11-04 MED ORDER — ONDANSETRON HCL 8 MG PO TABS
8.0000 mg | ORAL_TABLET | Freq: Once | ORAL | Status: AC
  Administered 2014-11-04: 8 mg via ORAL

## 2014-11-04 MED ORDER — BORTEZOMIB CHEMO SQ INJECTION 3.5 MG (2.5MG/ML)
1.3000 mg/m2 | Freq: Once | INTRAMUSCULAR | Status: AC
  Administered 2014-11-04: 2.75 mg via SUBCUTANEOUS
  Filled 2014-11-04: qty 2.75

## 2014-11-04 MED ORDER — FAMCICLOVIR 500 MG PO TABS
500.0000 mg | ORAL_TABLET | Freq: Every day | ORAL | Status: DC
Start: 2014-11-04 — End: 2015-01-02

## 2014-11-04 MED ORDER — ONDANSETRON HCL 8 MG PO TABS
ORAL_TABLET | ORAL | Status: AC
  Filled 2014-11-04: qty 1

## 2014-11-04 NOTE — Progress Notes (Signed)
Hematology and Oncology Follow Up Visit  Barbara Cook [id number] December 18, 1955 59 y.o. 11/04/2014   Principle Diagnosis:  IgG kappa myeloma   Current Therapy:     Velcade/Revlimid/Decadron   Zometa 4 mg IV every month     Interim History:  Ms.  Cook is back for a follow-up. She is doing well. The kyphoplasty really has helped her.  Her last M spike was 0.8 g/dL. This is improving. Her IgG level was 1630 mg/dL. This is up just a little. Her Kappa Light chain was 2.58 mg/dL.  She is doing quite well. On this day for that she is doing well.  I think we are probably adjust her Velcade schedule.  She is still on Revlimid 25 mg. At some point, if her M spike and continues to improve, we can cut her Revlimid dose back slowly.  She's not having diarrhea. Apparently she only has diarrhea when she gets Zometa.  She's had no leg swelling. She's had no rashes. She's had no cough. There's been no shortness of breath. She's had no headache.  Currently, her performance status is ECOG 1    Medications:  Current outpatient prescriptions:  .  acetaminophen (TYLENOL) 500 MG tablet, Take 500 mg by mouth every 6 (six) hours as needed for mild pain or headache., Disp: , Rfl:  .  aspirin 325 MG tablet, Take 325 mg by mouth daily., Disp: , Rfl:  .  belladona alk-PHENObarbital (DONNATAL) 16.2 MG tablet, Take 1-2 pills, Every 8 hours AS NEEDED, for abdominal spasms., Disp: 60 tablet, Rfl: 1 .  bortezomib IV (VELCADE) 3.5 MG injection, Inject 2.75 mg into the vein every 7 (seven) days., Disp: , Rfl:  .  Cholecalciferol (VITAMIN D) 1000 UNITS capsule, Take 2,000 Units by mouth 2 (two) times daily. 2 IN AM AND 2 IN PM, Disp: , Rfl:  .  dexamethasone (DECADRON) 4 MG tablet, Take 5 tablets once a week with food., Disp: 80 tablet, Rfl: 4 .  famciclovir (FAMVIR) 500 MG tablet, Take 1 tablet (500 mg total) by mouth daily., Disp: 90 tablet, Rfl: 3 .  lenalidomide (REVLIMID) 25 MG capsule, Take 1 capsule at  bedtime daily for 21 days and then off for 7 days. Auth# [id number], Disp: 21 capsule, Rfl: 0 .  Magnesium 250 MG TABS, Take 2 tablets by mouth daily. Takes 500 mg daily, Disp: , Rfl:  .  Multiple Vitamin (MULTIVITAMIN) tablet, Take 1 tablet by mouth every evening. , Disp: , Rfl:  .  ondansetron (ZOFRAN) 8 MG tablet, Take 59m twice a day. Start the day after chemo for 2 days. Then take for nausea and vomiting twice a day as needed., Disp: 30 tablet, Rfl: 3 .  pantoprazole (PROTONIX) 40 MG tablet, Take 1 tablet (40 mg total) by mouth 2 (two) times daily., Disp: 60 tablet, Rfl: 6 .  potassium chloride SA (K-DUR,KLOR-CON) 20 MEQ tablet, Take 1 tablet (20 mEq total) by mouth daily., Disp: 30 tablet, Rfl: 3 .  senna-docusate (SENOKOT-S) 8.6-50 MG per tablet, Take 4 tablets by mouth 2 (two) times daily., Disp: , Rfl:  .  sucralfate (CARAFATE) 1 GM/10ML suspension, Take 10 mLs (1 g total) by mouth 4 (four) times daily -  with meals and at bedtime., Disp: 420 mL, Rfl: 0 .  sulfamethoxazole-trimethoprim (BACTRIM DS,SEPTRA DS) 800-160 MG per tablet, Take 1 tablet by mouth 2 (two) times daily. For 3 days, Disp: 6 tablet, Rfl: 0 .  torsemide (DEMADEX) 20 MG tablet, Take 1 tablet (  20 mg total) by mouth daily as needed., Disp: 30 tablet, Rfl: 6  Allergies:  Allergies  Allergen Reactions  . Codeine Palpitations    Past Medical History, Surgical history, Social history, and Family History were reviewed and updated.  Review of Systems: As above  Physical Exam:  height is 5' 4"  (1.626 m) and weight is 175 lb (79.379 kg). Her oral temperature is 97.7 F (36.5 C). Her blood pressure is 105/51 and her pulse is 75. Her respiration is 18.   Well-developed and well-nourished African American female. Head and neck exam shows no ocular or oral lesions. There are no palpable cervical or supraclavicular lymph nodes. Lungs are clear. Cardiac exam regular rate and rhythm with no murmurs, rubs or bruits. Abdomen is soft.  She is mildly obese. She is no palpable liver or spleen tip. Back exam shows no tenderness over the spine, ribs or hips. Extremity shows no clubbing, cyanosis or edema. Skin exam shows no rashes, ecchymoses or petechia. Neurological exam is nonfocal.  Lab Results  Component Value Date   WBC 3.2* 11/04/2014   HGB 9.4* 11/04/2014   HCT 27.5* 11/04/2014   MCV 91 11/04/2014   PLT 222 11/04/2014     Chemistry      Component Value Date/Time   NA 140 11/04/2014 1032   NA 137 08/22/2014 0956   K 3.5 11/04/2014 1032   K 3.8 08/22/2014 0956   CL 102 11/04/2014 1032   CL 99 08/22/2014 0956   CO2 30 11/04/2014 1032   CO2 30 08/22/2014 0956   BUN 19 11/04/2014 1032   BUN 16 08/22/2014 0956   CREATININE 1.2 11/04/2014 1032   CREATININE 0.95 08/22/2014 0956      Component Value Date/Time   CALCIUM 9.3 11/04/2014 1032   CALCIUM 9.2 08/22/2014 0956   ALKPHOS 38 11/04/2014 1032   ALKPHOS 47 06/03/2014 0122   AST 35 11/04/2014 1032   AST 40* 06/03/2014 0122   ALT 44 11/04/2014 1032   ALT 43* 06/03/2014 0122   BILITOT 1.20 11/04/2014 1032   BILITOT 0.5 06/03/2014 0122         Impression and Plan: Barbara Cook is 59 year old African-American female with IgG kappa myeloma.   She really is doing quite well.   Apparently, she is not on Famvir. I'm not sure as to why she is not on it. I went ahead and reorder this. Other than this is very important for her to be on because of her being on the Velcade.  Hopefully, changing the Velcade dose to every other week will be reasonable and will maintain her M spike below 1.  I spent about 25-30 minutes with her. We will plan to have her come back in 2 weeks just for Velcade. I will see her back in 4 weeks. [name], MD 6/27/20166:45 PM

## 2014-11-04 NOTE — Patient Instructions (Signed)
Bortezomib injection What is this medicine? BORTEZOMIB (bor TEZ oh mib) is a chemotherapy drug. It slows the growth of cancer cells. This medicine is used to treat multiple myeloma, and certain lymphomas, such as mantle-cell lymphoma. This medicine may be used for other purposes; ask your health care provider or pharmacist if you have questions. COMMON BRAND NAME(S): Velcade What should I tell my health care provider before I take this medicine? They need to know if you have any of these conditions: -diabetes -heart disease -irregular heartbeat -liver disease -on hemodialysis -low blood counts, like low white blood cells, platelets, or hemoglobin -peripheral neuropathy -taking medicine for blood pressure -an unusual or allergic reaction to bortezomib, mannitol, boron, other medicines, foods, dyes, or preservatives -pregnant or trying to get pregnant -breast-feeding How should I use this medicine? This medicine is for injection into a vein or for injection under the skin. It is given by a health care professional in a hospital or clinic setting. Talk to your pediatrician regarding the use of this medicine in children. Special care may be needed. Overdosage: If you think you have taken too much of this medicine contact a poison control center or emergency room at once. NOTE: This medicine is only for you. Do not share this medicine with others. What if I miss a dose? It is important not to miss your dose. Call your doctor or health care professional if you are unable to keep an appointment. What may interact with this medicine? This medicine may interact with the following medications: -ketoconazole -rifampin -ritonavir -St. John's Wort This list may not describe all possible interactions. Give your health care provider a list of all the medicines, herbs, non-prescription drugs, or dietary supplements you use. Also tell them if you smoke, drink alcohol, or use illegal drugs. Some items  may interact with your medicine. What should I watch for while using this medicine? Visit your doctor for checks on your progress. This drug may make you feel generally unwell. This is not uncommon, as chemotherapy can affect healthy cells as well as cancer cells. Report any side effects. Continue your course of treatment even though you feel ill unless your doctor tells you to stop. You may get drowsy or dizzy. Do not drive, use machinery, or do anything that needs mental alertness until you know how this medicine affects you. Do not stand or sit up quickly, especially if you are an older patient. This reduces the risk of dizzy or fainting spells. In some cases, you may be given additional medicines to help with side effects. Follow all directions for their use. Call your doctor or health care professional for advice if you get a fever, chills or sore throat, or other symptoms of a cold or flu. Do not treat yourself. This drug decreases your body's ability to fight infections. Try to avoid being around people who are sick. This medicine may increase your risk to bruise or bleed. Call your doctor or health care professional if you notice any unusual bleeding. You may need blood work done while you are taking this medicine. In some patients, this medicine may cause a serious brain infection that may cause death. If you have any problems seeing, thinking, speaking, walking, or standing, tell your doctor right away. If you cannot reach your doctor, urgently seek other source of medical care. Do not become pregnant while taking this medicine. Women should inform their doctor if they wish to become pregnant or think they might be pregnant. There is   a potential for serious side effects to an unborn child. Talk to your health care professional or pharmacist for more information. Do not breast-feed an infant while taking this medicine. Check with your doctor or health care professional if you get an attack of  severe diarrhea, nausea and vomiting, or if you sweat a lot. The loss of too much body fluid can make it dangerous for you to take this medicine. What side effects may I notice from receiving this medicine? Side effects that you should report to your doctor or health care professional as soon as possible: -allergic reactions like skin rash, itching or hives, swelling of the face, lips, or tongue -breathing problems -changes in hearing -changes in vision -fast, irregular heartbeat -feeling faint or lightheaded, falls -pain, tingling, numbness in the hands or feet -right upper belly pain -seizures -swelling of the ankles, feet, hands -unusual bleeding or bruising -unusually weak or tired -vomiting -yellowing of the eyes or skin Side effects that usually do not require medical attention (report to your doctor or health care professional if they continue or are bothersome): -changes in emotions or moods -constipation -diarrhea -loss of appetite -headache -irritation at site where injected -nausea This list may not describe all possible side effects. Call your doctor for medical advice about side effects. You may report side effects to FDA at 1-800-FDA-1088. Where should I keep my medicine? This drug is given in a hospital or clinic and will not be stored at home. NOTE: This sheet is a summary. It may not cover all possible information. If you have questions about this medicine, talk to your doctor, pharmacist, or health care provider.  2015, [employer] Standard. (2013-02-19 12:46:32)   

## 2014-11-06 ENCOUNTER — Telehealth: Payer: Self-pay | Admitting: *Deleted

## 2014-11-06 LAB — PROTEIN ELECTROPHORESIS, SERUM, WITH REFLEX
Abnormal Protein Band1: 0.8 g/dL
Albumin ELP: 4 g/dL (ref 3.8–4.8)
Alpha-1-Globulin: 0.3 g/dL (ref 0.2–0.3)
Alpha-2-Globulin: 0.7 g/dL (ref 0.5–0.9)
Beta 2: 0.3 g/dL (ref 0.2–0.5)
Beta Globulin: 0.4 g/dL (ref 0.4–0.6)
Gamma Globulin: 1.4 g/dL (ref 0.8–1.7)
Total Protein, Serum Electrophoresis: 7.2 g/dL (ref 6.1–8.1)

## 2014-11-06 LAB — KAPPA/LAMBDA LIGHT CHAINS
Kappa free light chain: 3.28 mg/dL — ABNORMAL HIGH (ref 0.33–1.94)
Kappa:Lambda Ratio: 2.31 — ABNORMAL HIGH (ref 0.26–1.65)
Lambda Free Lght Chn: 1.42 mg/dL (ref 0.57–2.63)

## 2014-11-06 LAB — IGG, IGA, IGM
IgA: 40 mg/dL — ABNORMAL LOW (ref 69–380)
IgG (Immunoglobin G), Serum: 1550 mg/dL (ref 690–1700)
IgM, Serum: 11 mg/dL — ABNORMAL LOW (ref 52–322)

## 2014-11-06 LAB — IFE INTERPRETATION

## 2014-11-06 NOTE — Telephone Encounter (Addendum)
Patient is aware of results  ----- Message from [name], MD sent at 11/06/2014  1:42 PM EDT ----- Call and tell her that the protein level is still 0.8. This is fantastic. we can still try the every other week Velcade schedule. Thanks

## 2014-11-08 ENCOUNTER — Ambulatory Visit: Payer: Commercial Managed Care - HMO

## 2014-11-14 ENCOUNTER — Ambulatory Visit (HOSPITAL_BASED_OUTPATIENT_CLINIC_OR_DEPARTMENT_OTHER): Payer: Commercial Managed Care - HMO

## 2014-11-14 ENCOUNTER — Other Ambulatory Visit: Payer: Commercial Managed Care - HMO

## 2014-11-14 VITALS — BP 111/55 | HR 78 | Temp 98.2°F | Resp 18

## 2014-11-14 DIAGNOSIS — C9000 Multiple myeloma not having achieved remission: Secondary | ICD-10-CM

## 2014-11-14 MED ORDER — ZOLEDRONIC ACID 4 MG/100ML IV SOLN
4.0000 mg | Freq: Once | INTRAVENOUS | Status: AC
  Administered 2014-11-14: 4 mg via INTRAVENOUS
  Filled 2014-11-14: qty 100

## 2014-11-14 NOTE — Patient Instructions (Signed)

## 2014-11-18 ENCOUNTER — Ambulatory Visit (HOSPITAL_BASED_OUTPATIENT_CLINIC_OR_DEPARTMENT_OTHER): Payer: Commercial Managed Care - HMO

## 2014-11-18 VITALS — BP 120/57 | HR 80 | Temp 98.2°F | Resp 18

## 2014-11-18 DIAGNOSIS — Z5112 Encounter for antineoplastic immunotherapy: Secondary | ICD-10-CM

## 2014-11-18 DIAGNOSIS — C9000 Multiple myeloma not having achieved remission: Secondary | ICD-10-CM | POA: Diagnosis not present

## 2014-11-18 MED ORDER — ONDANSETRON HCL 8 MG PO TABS
8.0000 mg | ORAL_TABLET | Freq: Once | ORAL | Status: AC
  Administered 2014-11-18: 8 mg via ORAL

## 2014-11-18 MED ORDER — BORTEZOMIB CHEMO SQ INJECTION 3.5 MG (2.5MG/ML)
1.3000 mg/m2 | Freq: Once | INTRAMUSCULAR | Status: AC
  Administered 2014-11-18: 2.75 mg via SUBCUTANEOUS
  Filled 2014-11-18: qty 2.75

## 2014-11-18 MED ORDER — ONDANSETRON HCL 8 MG PO TABS
ORAL_TABLET | ORAL | Status: AC
  Filled 2014-11-18: qty 1

## 2014-11-18 NOTE — Patient Instructions (Signed)
Bortezomib injection What is this medicine? BORTEZOMIB (bor TEZ oh mib) is a chemotherapy drug. It slows the growth of cancer cells. This medicine is used to treat multiple myeloma, and certain lymphomas, such as mantle-cell lymphoma. This medicine may be used for other purposes; ask your health care provider or pharmacist if you have questions. COMMON BRAND NAME(S): Velcade What should I tell my health care provider before I take this medicine? They need to know if you have any of these conditions: -diabetes -heart disease -irregular heartbeat -liver disease -on hemodialysis -low blood counts, like low white blood cells, platelets, or hemoglobin -peripheral neuropathy -taking medicine for blood pressure -an unusual or allergic reaction to bortezomib, mannitol, boron, other medicines, foods, dyes, or preservatives -pregnant or trying to get pregnant -breast-feeding How should I use this medicine? This medicine is for injection into a vein or for injection under the skin. It is given by a health care professional in a hospital or clinic setting. Talk to your pediatrician regarding the use of this medicine in children. Special care may be needed. Overdosage: If you think you have taken too much of this medicine contact a poison control center or emergency room at once. NOTE: This medicine is only for you. Do not share this medicine with others. What if I miss a dose? It is important not to miss your dose. Call your doctor or health care professional if you are unable to keep an appointment. What may interact with this medicine? This medicine may interact with the following medications: -ketoconazole -rifampin -ritonavir -St. John's Wort This list may not describe all possible interactions. Give your health care provider a list of all the medicines, herbs, non-prescription drugs, or dietary supplements you use. Also tell them if you smoke, drink alcohol, or use illegal drugs. Some items  may interact with your medicine. What should I watch for while using this medicine? Visit your doctor for checks on your progress. This drug may make you feel generally unwell. This is not uncommon, as chemotherapy can affect healthy cells as well as cancer cells. Report any side effects. Continue your course of treatment even though you feel ill unless your doctor tells you to stop. You may get drowsy or dizzy. Do not drive, use machinery, or do anything that needs mental alertness until you know how this medicine affects you. Do not stand or sit up quickly, especially if you are an older patient. This reduces the risk of dizzy or fainting spells. In some cases, you may be given additional medicines to help with side effects. Follow all directions for their use. Call your doctor or health care professional for advice if you get a fever, chills or sore throat, or other symptoms of a cold or flu. Do not treat yourself. This drug decreases your body's ability to fight infections. Try to avoid being around people who are sick. This medicine may increase your risk to bruise or bleed. Call your doctor or health care professional if you notice any unusual bleeding. You may need blood work done while you are taking this medicine. In some patients, this medicine may cause a serious brain infection that may cause death. If you have any problems seeing, thinking, speaking, walking, or standing, tell your doctor right away. If you cannot reach your doctor, urgently seek other source of medical care. Do not become pregnant while taking this medicine. Women should inform their doctor if they wish to become pregnant or think they might be pregnant. There is   a potential for serious side effects to an unborn child. Talk to your health care professional or pharmacist for more information. Do not breast-feed an infant while taking this medicine. Check with your doctor or health care professional if you get an attack of  severe diarrhea, nausea and vomiting, or if you sweat a lot. The loss of too much body fluid can make it dangerous for you to take this medicine. What side effects may I notice from receiving this medicine? Side effects that you should report to your doctor or health care professional as soon as possible: -allergic reactions like skin rash, itching or hives, swelling of the face, lips, or tongue -breathing problems -changes in hearing -changes in vision -fast, irregular heartbeat -feeling faint or lightheaded, falls -pain, tingling, numbness in the hands or feet -right upper belly pain -seizures -swelling of the ankles, feet, hands -unusual bleeding or bruising -unusually weak or tired -vomiting -yellowing of the eyes or skin Side effects that usually do not require medical attention (report to your doctor or health care professional if they continue or are bothersome): -changes in emotions or moods -constipation -diarrhea -loss of appetite -headache -irritation at site where injected -nausea This list may not describe all possible side effects. Call your doctor for medical advice about side effects. You may report side effects to FDA at 1-800-FDA-1088. Where should I keep my medicine? This drug is given in a hospital or clinic and will not be stored at home. NOTE: This sheet is a summary. It may not cover all possible information. If you have questions about this medicine, talk to your doctor, pharmacist, or health care provider.  2015, Elsevier/Gold Standard. (2013-02-19 12:46:32)  

## 2014-12-02 ENCOUNTER — Ambulatory Visit (HOSPITAL_BASED_OUTPATIENT_CLINIC_OR_DEPARTMENT_OTHER): Payer: Commercial Managed Care - HMO

## 2014-12-02 ENCOUNTER — Ambulatory Visit (HOSPITAL_BASED_OUTPATIENT_CLINIC_OR_DEPARTMENT_OTHER): Payer: Commercial Managed Care - HMO | Admitting: Hematology & Oncology

## 2014-12-02 ENCOUNTER — Encounter: Payer: Self-pay | Admitting: Hematology & Oncology

## 2014-12-02 ENCOUNTER — Other Ambulatory Visit: Payer: Self-pay | Admitting: *Deleted

## 2014-12-02 VITALS — BP 118/61 | HR 86 | Temp 97.5°F | Resp 14 | Ht 64.0 in | Wt 174.0 lb

## 2014-12-02 DIAGNOSIS — C9000 Multiple myeloma not having achieved remission: Secondary | ICD-10-CM

## 2014-12-02 DIAGNOSIS — Z5112 Encounter for antineoplastic immunotherapy: Secondary | ICD-10-CM

## 2014-12-02 LAB — CBC WITH DIFFERENTIAL (CANCER CENTER ONLY)
BASO#: 0 10*3/uL (ref 0.0–0.2)
BASO%: 0.3 % (ref 0.0–2.0)
EOS%: 0.5 % (ref 0.0–7.0)
Eosinophils Absolute: 0 10*3/uL (ref 0.0–0.5)
HCT: 28.2 % — ABNORMAL LOW (ref 34.8–46.6)
HEMOGLOBIN: 9.5 g/dL — AB (ref 11.6–15.9)
LYMPH#: 0.9 10*3/uL (ref 0.9–3.3)
LYMPH%: 24 % (ref 14.0–48.0)
MCH: 30.6 pg (ref 26.0–34.0)
MCHC: 33.7 g/dL (ref 32.0–36.0)
MCV: 91 fL (ref 81–101)
MONO#: 0.5 10*3/uL (ref 0.1–0.9)
MONO%: 13.3 % — ABNORMAL HIGH (ref 0.0–13.0)
NEUT#: 2.4 10*3/uL (ref 1.5–6.5)
NEUT%: 61.9 % (ref 39.6–80.0)
Platelets: 233 10*3/uL (ref 145–400)
RBC: 3.1 10*6/uL — AB (ref 3.70–5.32)
RDW: 15.2 % (ref 11.1–15.7)
WBC: 3.8 10*3/uL — ABNORMAL LOW (ref 3.9–10.0)

## 2014-12-02 LAB — CMP (CANCER CENTER ONLY)
ALT(SGPT): 33 U/L (ref 10–47)
AST: 33 U/L (ref 11–38)
Albumin: 4.1 g/dL (ref 3.3–5.5)
Alkaline Phosphatase: 41 U/L (ref 26–84)
BUN, Bld: 15 mg/dL (ref 7–22)
CALCIUM: 9.7 mg/dL (ref 8.0–10.3)
CHLORIDE: 102 meq/L (ref 98–108)
CO2: 29 mEq/L (ref 18–33)
CREATININE: 0.8 mg/dL (ref 0.6–1.2)
Glucose, Bld: 112 mg/dL (ref 73–118)
Potassium: 3.5 mEq/L (ref 3.3–4.7)
SODIUM: 143 meq/L (ref 128–145)
TOTAL PROTEIN: 7.7 g/dL (ref 6.4–8.1)
Total Bilirubin: 0.9 mg/dl (ref 0.20–1.60)

## 2014-12-02 MED ORDER — ONDANSETRON HCL 8 MG PO TABS
ORAL_TABLET | ORAL | Status: AC
  Filled 2014-12-02: qty 1

## 2014-12-02 MED ORDER — ONDANSETRON HCL 8 MG PO TABS
8.0000 mg | ORAL_TABLET | Freq: Once | ORAL | Status: AC
Start: 2014-12-02 — End: 2014-12-02
  Administered 2014-12-02: 8 mg via ORAL

## 2014-12-02 MED ORDER — LENALIDOMIDE 20 MG PO CAPS: ORAL_CAPSULE | ORAL | Status: DC

## 2014-12-02 MED ORDER — BORTEZOMIB CHEMO SQ INJECTION 3.5 MG (2.5MG/ML)
1.3000 mg/m2 | Freq: Once | INTRAMUSCULAR | Status: AC
  Administered 2014-12-02: 2.75 mg via SUBCUTANEOUS
  Filled 2014-12-02: qty 2.75

## 2014-12-02 NOTE — Patient Instructions (Signed)
Bortezomib injection What is this medicine? BORTEZOMIB (bor TEZ oh mib) is a chemotherapy drug. It slows the growth of cancer cells. This medicine is used to treat multiple myeloma, and certain lymphomas, such as mantle-cell lymphoma. This medicine may be used for other purposes; ask your health care provider or pharmacist if you have questions. COMMON BRAND NAME(S): Velcade What should I tell my health care provider before I take this medicine? They need to know if you have any of these conditions: -diabetes -heart disease -irregular heartbeat -liver disease -on hemodialysis -low blood counts, like low white blood cells, platelets, or hemoglobin -peripheral neuropathy -taking medicine for blood pressure -an unusual or allergic reaction to bortezomib, mannitol, boron, other medicines, foods, dyes, or preservatives -pregnant or trying to get pregnant -breast-feeding How should I use this medicine? This medicine is for injection into a vein or for injection under the skin. It is given by a health care professional in a hospital or clinic setting. Talk to your pediatrician regarding the use of this medicine in children. Special care may be needed. Overdosage: If you think you have taken too much of this medicine contact a poison control center or emergency room at once. NOTE: This medicine is only for you. Do not share this medicine with others. What if I miss a dose? It is important not to miss your dose. Call your doctor or health care professional if you are unable to keep an appointment. What may interact with this medicine? This medicine may interact with the following medications: -ketoconazole -rifampin -ritonavir -St. John's Wort This list may not describe all possible interactions. Give your health care provider a list of all the medicines, herbs, non-prescription drugs, or dietary supplements you use. Also tell them if you smoke, drink alcohol, or use illegal drugs. Some items  may interact with your medicine. What should I watch for while using this medicine? Visit your doctor for checks on your progress. This drug may make you feel generally unwell. This is not uncommon, as chemotherapy can affect healthy cells as well as cancer cells. Report any side effects. Continue your course of treatment even though you feel ill unless your doctor tells you to stop. You may get drowsy or dizzy. Do not drive, use machinery, or do anything that needs mental alertness until you know how this medicine affects you. Do not stand or sit up quickly, especially if you are an older patient. This reduces the risk of dizzy or fainting spells. In some cases, you may be given additional medicines to help with side effects. Follow all directions for their use. Call your doctor or health care professional for advice if you get a fever, chills or sore throat, or other symptoms of a cold or flu. Do not treat yourself. This drug decreases your body's ability to fight infections. Try to avoid being around people who are sick. This medicine may increase your risk to bruise or bleed. Call your doctor or health care professional if you notice any unusual bleeding. You may need blood work done while you are taking this medicine. In some patients, this medicine may cause a serious brain infection that may cause death. If you have any problems seeing, thinking, speaking, walking, or standing, tell your doctor right away. If you cannot reach your doctor, urgently seek other source of medical care. Do not become pregnant while taking this medicine. Women should inform their doctor if they wish to become pregnant or think they might be pregnant. There is   a potential for serious side effects to an unborn child. Talk to your health care professional or pharmacist for more information. Do not breast-feed an infant while taking this medicine. Check with your doctor or health care professional if you get an attack of  severe diarrhea, nausea and vomiting, or if you sweat a lot. The loss of too much body fluid can make it dangerous for you to take this medicine. What side effects may I notice from receiving this medicine? Side effects that you should report to your doctor or health care professional as soon as possible: -allergic reactions like skin rash, itching or hives, swelling of the face, lips, or tongue -breathing problems -changes in hearing -changes in vision -fast, irregular heartbeat -feeling faint or lightheaded, falls -pain, tingling, numbness in the hands or feet -right upper belly pain -seizures -swelling of the ankles, feet, hands -unusual bleeding or bruising -unusually weak or tired -vomiting -yellowing of the eyes or skin Side effects that usually do not require medical attention (report to your doctor or health care professional if they continue or are bothersome): -changes in emotions or moods -constipation -diarrhea -loss of appetite -headache -irritation at site where injected -nausea This list may not describe all possible side effects. Call your doctor for medical advice about side effects. You may report side effects to FDA at 1-800-FDA-1088. Where should I keep my medicine? This drug is given in a hospital or clinic and will not be stored at home. NOTE: This sheet is a summary. It may not cover all possible information. If you have questions about this medicine, talk to your doctor, pharmacist, or health care provider.  2015, [employer] Standard. (2013-02-19 12:46:32)   

## 2014-12-02 NOTE — Progress Notes (Signed)
Hematology and Oncology Follow Up Visit  Barbara Cook [id number] 1956-04-22 59 y.o. 12/02/2014   Principle Diagnosis:  IgG kappa myeloma   Current Therapy:     Velcade/Revlimid/Decadron   Zometa 4 mg IV every month     Interim History:  Ms.  Cook is back for a follow-up. She is doing well. The kyphoplasty really has helped her.  She and her husband ran [place] for a couple funerals. These were family members. This is been a little bit distressing for her.  Her last M spike was 0.8 g/dL. This is improving. Her IgG level was 1550 mg/dL. This is up just a little. Her Kappa Light chain was 3.28 mg/dL.  She is doing quite well.   We have adjusted her Velcade to every 2 weeks. I think this is reasonable. Her myeloma studies have been quite low. I think she is really pretty much asymptomatic with the myeloma..  She is still on Revlimid 25 mg. I am decreasing her dose down to 20 mg a day.   She's not having diarrhea. Apparently she only has diarrhea when she gets Zometa.  She's had no leg swelling. She's had no rashes. She's had no cough. There's been no shortness of breath. She's had no headache.  Currently, her performance status is ECOG 1    Medications:  Current outpatient prescriptions:  .  acetaminophen (TYLENOL) 500 MG tablet, Take 500 mg by mouth every 6 (six) hours as needed for mild pain or headache., Disp: , Rfl:  .  aspirin 325 MG tablet, Take 325 mg by mouth daily., Disp: , Rfl:  .  belladona alk-PHENObarbital (DONNATAL) 16.2 MG tablet, Take 1-2 pills, Every 8 hours AS NEEDED, for abdominal spasms., Disp: 60 tablet, Rfl: 1 .  bortezomib IV (VELCADE) 3.5 MG injection, Inject 2.75 mg into the vein every 7 (seven) days., Disp: , Rfl:  .  Cholecalciferol (VITAMIN D) 1000 UNITS capsule, Take 2,000 Units by mouth 2 (two) times daily. 2 IN AM AND 2 IN PM, Disp: , Rfl:  .  dexamethasone (DECADRON) 4 MG tablet, Take 5 tablets once a week with food., Disp: 80 tablet, Rfl:  4 .  famciclovir (FAMVIR) 500 MG tablet, Take 1 tablet (500 mg total) by mouth daily., Disp: 90 tablet, Rfl: 3 .  fluocinonide cream (LIDEX) 0.05 %, APPLY TO THE AFFECTED AREAS ON BODY TWICE DAILY UPON IRRITATION ONLY, Disp: , Rfl: 2 .  Magnesium 250 MG TABS, Take 2 tablets by mouth daily. Takes 500 mg daily, Disp: , Rfl:  .  Multiple Vitamin (MULTIVITAMIN) tablet, Take 1 tablet by mouth every evening. , Disp: , Rfl:  .  ondansetron (ZOFRAN) 8 MG tablet, Take 25m twice a day. Start the day after chemo for 2 days. Then take for nausea and vomiting twice a day as needed., Disp: 30 tablet, Rfl: 3 .  pantoprazole (PROTONIX) 40 MG tablet, Take 1 tablet (40 mg total) by mouth 2 (two) times daily., Disp: 60 tablet, Rfl: 6 .  potassium chloride SA (K-DUR,KLOR-CON) 20 MEQ tablet, Take 1 tablet (20 mEq total) by mouth daily., Disp: 30 tablet, Rfl: 3 .  senna-docusate (SENOKOT-S) 8.6-50 MG per tablet, Take 4 tablets by mouth 2 (two) times daily., Disp: , Rfl:  .  spironolactone (ALDACTONE) 50 MG tablet, Take 50 mg by mouth daily., Disp: , Rfl: 4 .  sucralfate (CARAFATE) 1 GM/10ML suspension, Take 10 mLs (1 g total) by mouth 4 (four) times daily -  with meals and at  bedtime., Disp: 420 mL, Rfl: 0 .  sulfamethoxazole-trimethoprim (BACTRIM DS,SEPTRA DS) 800-160 MG per tablet, Take 1 tablet by mouth 2 (two) times daily. For 3 days, Disp: 6 tablet, Rfl: 0 .  torsemide (DEMADEX) 20 MG tablet, Take 1 tablet (20 mg total) by mouth daily as needed., Disp: 30 tablet, Rfl: 6 .  Lenalidomide 20 MG CAPS, Take 1 capsule at bedtime daily for 21 days and then off for 7 days. Auth# [id number], Disp: 21 capsule, Rfl: 4  Allergies:  Allergies  Allergen Reactions  . Codeine Palpitations    Past Medical History, Surgical history, Social history, and Family History were reviewed and updated.  Review of Systems: As above  Physical Exam:  height is 5' 4"  (1.626 m) and weight is 174 lb (78.926 kg). Her oral temperature is 97.5  F (36.4 C). Her blood pressure is 118/61 and her pulse is 86. Her respiration is 14.   Well-developed and well-nourished African American female. Head and neck exam shows no ocular or oral lesions. There are no palpable cervical or supraclavicular lymph nodes. Lungs are clear. Cardiac exam regular rate and rhythm with no murmurs, rubs or bruits. Abdomen is soft. She is mildly obese. She is no palpable liver or spleen tip. Back exam shows no tenderness over the spine, ribs or hips. Extremity shows no clubbing, cyanosis or edema. Skin exam shows no rashes, ecchymoses or petechia. Neurological exam is nonfocal.  Lab Results  Component Value Date   WBC 3.8* 12/02/2014   HGB 9.5* 12/02/2014   HCT 28.2* 12/02/2014   MCV 91 12/02/2014   PLT 233 12/02/2014     Chemistry      Component Value Date/Time   NA 143 12/02/2014 1319   NA 137 08/22/2014 0956   K 3.5 12/02/2014 1319   K 3.8 08/22/2014 0956   CL 102 12/02/2014 1319   CL 99 08/22/2014 0956   CO2 29 12/02/2014 1319   CO2 30 08/22/2014 0956   BUN 15 12/02/2014 1319   BUN 16 08/22/2014 0956   CREATININE 0.8 12/02/2014 1319   CREATININE 0.95 08/22/2014 0956      Component Value Date/Time   CALCIUM 9.7 12/02/2014 1319   CALCIUM 9.2 08/22/2014 0956   ALKPHOS 41 12/02/2014 1319   ALKPHOS 47 06/03/2014 0122   AST 33 12/02/2014 1319   AST 40* 06/03/2014 0122   ALT 33 12/02/2014 1319   ALT 43* 06/03/2014 0122   BILITOT 0.90 12/02/2014 1319   BILITOT 0.5 06/03/2014 0122         Impression and Plan: Barbara Cook is 59 year old African-American female with IgG kappa myeloma.   She really is doing quite well.   Hopefully, we can keep her on the every other week Velcade. I am dropping her Revlimid dose gradually. I will put her on 20 mg a day.  Her quality of life is better. Her performance status is doing quite well.  She still is on quite a few medications. Hopefully, these can be narrowed down in the future.  She has to come  in later this week for the Zometa. She cannot take Zometa and Velcade at the same day.  I will plan to see her back in 4 weeks.   I spent about 25-30 minutes with she and her husband. I reviewed all her lab work. I explained to them are recommendations. They are in agreement.[name], MD 7/25/20165:53 PM

## 2014-12-03 ENCOUNTER — Telehealth: Payer: Self-pay | Admitting: Hematology & Oncology

## 2014-12-03 NOTE — Telephone Encounter (Signed)
LT MESS REGARDING 7/29 APPT DATE AND TIME

## 2014-12-04 ENCOUNTER — Other Ambulatory Visit: Payer: Self-pay | Admitting: *Deleted

## 2014-12-04 DIAGNOSIS — C9000 Multiple myeloma not having achieved remission: Secondary | ICD-10-CM

## 2014-12-04 LAB — PROTEIN ELECTROPHORESIS, SERUM, WITH REFLEX
ALPHA-1-GLOBULIN: 0.3 g/dL (ref 0.2–0.3)
ALPHA-2-GLOBULIN: 0.8 g/dL (ref 0.5–0.9)
Abnormal Protein Band1: 0.8 g/dL
Albumin ELP: 3.9 g/dL (ref 3.8–4.8)
Beta 2: 0.3 g/dL (ref 0.2–0.5)
Beta Globulin: 0.4 g/dL (ref 0.4–0.6)
Gamma Globulin: 1.3 g/dL (ref 0.8–1.7)
Total Protein, Serum Electrophoresis: 6.9 g/dL (ref 6.1–8.1)

## 2014-12-04 LAB — LACTATE DEHYDROGENASE: LDH: 202 U/L (ref 94–250)

## 2014-12-04 LAB — KAPPA/LAMBDA LIGHT CHAINS
KAPPA FREE LGHT CHN: 2.63 mg/dL — AB (ref 0.33–1.94)
KAPPA LAMBDA RATIO: 3.09 — AB (ref 0.26–1.65)
LAMBDA FREE LGHT CHN: 0.85 mg/dL (ref 0.57–2.63)

## 2014-12-04 LAB — IFE INTERPRETATION

## 2014-12-04 LAB — IGG, IGA, IGM
IGM, SERUM: 11 mg/dL — AB (ref 52–322)
IgA: 47 mg/dL — ABNORMAL LOW (ref 69–380)
IgG (Immunoglobin G), Serum: 1450 mg/dL (ref 690–1700)

## 2014-12-04 MED ORDER — LENALIDOMIDE 20 MG PO CAPS: ORAL_CAPSULE | ORAL | Status: DC

## 2014-12-05 ENCOUNTER — Telehealth: Payer: Self-pay | Admitting: *Deleted

## 2014-12-05 NOTE — Telephone Encounter (Addendum)
Patient aware of results  ----- Message from [name], MD sent at 12/05/2014  6:30 AM EDT ----- Call - myeloma level is still very low!!  The protein level is still 0.8!!  This is good!!  Barbara Cook

## 2014-12-06 ENCOUNTER — Ambulatory Visit (HOSPITAL_BASED_OUTPATIENT_CLINIC_OR_DEPARTMENT_OTHER): Payer: Commercial Managed Care - HMO

## 2014-12-06 VITALS — BP 115/56 | HR 79 | Temp 98.2°F | Resp 18

## 2014-12-06 DIAGNOSIS — C9000 Multiple myeloma not having achieved remission: Secondary | ICD-10-CM

## 2014-12-06 MED ORDER — ZOLEDRONIC ACID 4 MG/100ML IV SOLN
4.0000 mg | Freq: Once | INTRAVENOUS | Status: AC
  Administered 2014-12-06: 4 mg via INTRAVENOUS
  Filled 2014-12-06: qty 100

## 2014-12-06 NOTE — Patient Instructions (Signed)

## 2014-12-16 ENCOUNTER — Inpatient Hospital Stay: Payer: Commercial Managed Care - HMO

## 2014-12-16 ENCOUNTER — Ambulatory Visit (HOSPITAL_BASED_OUTPATIENT_CLINIC_OR_DEPARTMENT_OTHER): Payer: Commercial Managed Care - HMO

## 2014-12-16 VITALS — BP 117/48 | HR 79 | Temp 97.7°F | Resp 16 | Ht 64.0 in | Wt 175.0 lb

## 2014-12-16 DIAGNOSIS — Z5112 Encounter for antineoplastic immunotherapy: Secondary | ICD-10-CM | POA: Diagnosis not present

## 2014-12-16 DIAGNOSIS — C9000 Multiple myeloma not having achieved remission: Secondary | ICD-10-CM | POA: Diagnosis not present

## 2014-12-16 MED ORDER — ONDANSETRON HCL 8 MG PO TABS
8.0000 mg | ORAL_TABLET | Freq: Once | ORAL | Status: AC
  Administered 2014-12-16: 8 mg via ORAL

## 2014-12-16 MED ORDER — ONDANSETRON HCL 8 MG PO TABS
ORAL_TABLET | ORAL | Status: AC
  Filled 2014-12-16: qty 1

## 2014-12-16 MED ORDER — BORTEZOMIB CHEMO SQ INJECTION 3.5 MG (2.5MG/ML)
1.3000 mg/m2 | Freq: Once | INTRAMUSCULAR | Status: AC
  Administered 2014-12-16: 2.75 mg via SUBCUTANEOUS
  Filled 2014-12-16: qty 2.75

## 2014-12-16 NOTE — Patient Instructions (Signed)
Bortezomib injection What is this medicine? BORTEZOMIB (bor TEZ oh mib) is a chemotherapy drug. It slows the growth of cancer cells. This medicine is used to treat multiple myeloma, and certain lymphomas, such as mantle-cell lymphoma. This medicine may be used for other purposes; ask your health care provider or pharmacist if you have questions. COMMON BRAND NAME(S): Velcade What should I tell my health care provider before I take this medicine? They need to know if you have any of these conditions: -diabetes -heart disease -irregular heartbeat -liver disease -on hemodialysis -low blood counts, like low white blood cells, platelets, or hemoglobin -peripheral neuropathy -taking medicine for blood pressure -an unusual or allergic reaction to bortezomib, mannitol, boron, other medicines, foods, dyes, or preservatives -pregnant or trying to get pregnant -breast-feeding How should I use this medicine? This medicine is for injection into a vein or for injection under the skin. It is given by a health care professional in a hospital or clinic setting. Talk to your pediatrician regarding the use of this medicine in children. Special care may be needed. Overdosage: If you think you have taken too much of this medicine contact a poison control center or emergency room at once. NOTE: This medicine is only for you. Do not share this medicine with others. What if I miss a dose? It is important not to miss your dose. Call your doctor or health care professional if you are unable to keep an appointment. What may interact with this medicine? This medicine may interact with the following medications: -ketoconazole -rifampin -ritonavir -St. John's Wort This list may not describe all possible interactions. Give your health care provider a list of all the medicines, herbs, non-prescription drugs, or dietary supplements you use. Also tell them if you smoke, drink alcohol, or use illegal drugs. Some items  may interact with your medicine. What should I watch for while using this medicine? Visit your doctor for checks on your progress. This drug may make you feel generally unwell. This is not uncommon, as chemotherapy can affect healthy cells as well as cancer cells. Report any side effects. Continue your course of treatment even though you feel ill unless your doctor tells you to stop. You may get drowsy or dizzy. Do not drive, use machinery, or do anything that needs mental alertness until you know how this medicine affects you. Do not stand or sit up quickly, especially if you are an older patient. This reduces the risk of dizzy or fainting spells. In some cases, you may be given additional medicines to help with side effects. Follow all directions for their use. Call your doctor or health care professional for advice if you get a fever, chills or sore throat, or other symptoms of a cold or flu. Do not treat yourself. This drug decreases your body's ability to fight infections. Try to avoid being around people who are sick. This medicine may increase your risk to bruise or bleed. Call your doctor or health care professional if you notice any unusual bleeding. You may need blood work done while you are taking this medicine. In some patients, this medicine may cause a serious brain infection that may cause death. If you have any problems seeing, thinking, speaking, walking, or standing, tell your doctor right away. If you cannot reach your doctor, urgently seek other source of medical care. Do not become pregnant while taking this medicine. Women should inform their doctor if they wish to become pregnant or think they might be pregnant. There is   a potential for serious side effects to an unborn child. Talk to your health care professional or pharmacist for more information. Do not breast-feed an infant while taking this medicine. Check with your doctor or health care professional if you get an attack of  severe diarrhea, nausea and vomiting, or if you sweat a lot. The loss of too much body fluid can make it dangerous for you to take this medicine. What side effects may I notice from receiving this medicine? Side effects that you should report to your doctor or health care professional as soon as possible: -allergic reactions like skin rash, itching or hives, swelling of the face, lips, or tongue -breathing problems -changes in hearing -changes in vision -fast, irregular heartbeat -feeling faint or lightheaded, falls -pain, tingling, numbness in the hands or feet -right upper belly pain -seizures -swelling of the ankles, feet, hands -unusual bleeding or bruising -unusually weak or tired -vomiting -yellowing of the eyes or skin Side effects that usually do not require medical attention (report to your doctor or health care professional if they continue or are bothersome): -changes in emotions or moods -constipation -diarrhea -loss of appetite -headache -irritation at site where injected -nausea This list may not describe all possible side effects. Call your doctor for medical advice about side effects. You may report side effects to FDA at 1-800-FDA-1088. Where should I keep my medicine? This drug is given in a hospital or clinic and will not be stored at home. NOTE: This sheet is a summary. It may not cover all possible information. If you have questions about this medicine, talk to your doctor, pharmacist, or health care provider.  2015, Elsevier/Gold Standard. (2013-02-19 12:46:32)  

## 2014-12-19 ENCOUNTER — Telehealth: Payer: Self-pay | Admitting: *Deleted

## 2014-12-19 NOTE — Telephone Encounter (Signed)
Patient c/o acid reflux, bloating, gas and abdominal discomfort. She had taken pepcid without significant relief. Spoke to Dr Marin Olp and he would like patient to try pepto bismol or maalox OTC. Spoke to patient and she will try these. Instructed to call back if symptoms don't improve or gets worse. She is in agreement.

## 2014-12-20 ENCOUNTER — Inpatient Hospital Stay: Payer: Commercial Managed Care - HMO

## 2014-12-25 ENCOUNTER — Ambulatory Visit (HOSPITAL_BASED_OUTPATIENT_CLINIC_OR_DEPARTMENT_OTHER): Payer: Commercial Managed Care - HMO

## 2014-12-25 DIAGNOSIS — C9000 Multiple myeloma not having achieved remission: Secondary | ICD-10-CM | POA: Diagnosis not present

## 2014-12-25 LAB — CBC WITH DIFFERENTIAL (CANCER CENTER ONLY)
BASO#: 0 10*3/uL (ref 0.0–0.2)
BASO%: 0.4 % (ref 0.0–2.0)
EOS%: 4.3 % (ref 0.0–7.0)
Eosinophils Absolute: 0.1 10*3/uL (ref 0.0–0.5)
HEMATOCRIT: 29.4 % — AB (ref 34.8–46.6)
HEMOGLOBIN: 9.9 g/dL — AB (ref 11.6–15.9)
LYMPH#: 0.8 10*3/uL — AB (ref 0.9–3.3)
LYMPH%: 30.6 % (ref 14.0–48.0)
MCH: 30.7 pg (ref 26.0–34.0)
MCHC: 33.7 g/dL (ref 32.0–36.0)
MCV: 91 fL (ref 81–101)
MONO#: 0.5 10*3/uL (ref 0.1–0.9)
MONO%: 18.2 % — ABNORMAL HIGH (ref 0.0–13.0)
NEUT%: 46.5 % (ref 39.6–80.0)
NEUTROS ABS: 1.2 10*3/uL — AB (ref 1.5–6.5)
Platelets: 158 10*3/uL (ref 145–400)
RBC: 3.22 10*6/uL — AB (ref 3.70–5.32)
RDW: 14.8 % (ref 11.1–15.7)
WBC: 2.6 10*3/uL — ABNORMAL LOW (ref 3.9–10.0)

## 2014-12-25 LAB — COMPREHENSIVE METABOLIC PANEL (CC13)
ALBUMIN: 3.7 g/dL (ref 3.5–5.0)
ALK PHOS: 47 U/L (ref 40–150)
ALT: 20 U/L (ref 0–55)
AST: 20 U/L (ref 5–34)
Anion Gap: 8 mEq/L (ref 3–11)
BUN: 17.8 mg/dL (ref 7.0–26.0)
CALCIUM: 9.5 mg/dL (ref 8.4–10.4)
CHLORIDE: 102 meq/L (ref 98–109)
CO2: 29 mEq/L (ref 22–29)
Creatinine: 0.8 mg/dL (ref 0.6–1.1)
EGFR: 90 mL/min/{1.73_m2} — AB (ref >90)
GLUCOSE: 83 mg/dL (ref 70–140)
POTASSIUM: 4 meq/L (ref 3.5–5.1)
Sodium: 139 mEq/L (ref 136–145)
Total Bilirubin: 0.56 mg/dL (ref 0.20–1.20)
Total Protein: 7.1 g/dL (ref 6.4–8.3)

## 2014-12-25 LAB — IRON AND TIBC CHCC
%SAT: 23 % (ref 21–57)
Iron: 74 ug/dL (ref 41–142)
TIBC: 323 ug/dL (ref 236–444)
UIBC: 248 ug/dL (ref 120–384)

## 2014-12-25 LAB — FERRITIN CHCC: Ferritin: 296 ng/ml — ABNORMAL HIGH (ref 9–269)

## 2014-12-26 ENCOUNTER — Other Ambulatory Visit: Payer: Self-pay | Admitting: Hematology & Oncology

## 2014-12-26 ENCOUNTER — Other Ambulatory Visit: Payer: Self-pay | Admitting: *Deleted

## 2014-12-26 DIAGNOSIS — C9000 Multiple myeloma not having achieved remission: Secondary | ICD-10-CM

## 2014-12-26 MED ORDER — LENALIDOMIDE 20 MG PO CAPS: ORAL_CAPSULE | ORAL | Status: DC

## 2014-12-30 ENCOUNTER — Ambulatory Visit (HOSPITAL_BASED_OUTPATIENT_CLINIC_OR_DEPARTMENT_OTHER): Payer: Commercial Managed Care - HMO

## 2014-12-30 ENCOUNTER — Other Ambulatory Visit: Payer: Self-pay | Admitting: Hematology & Oncology

## 2014-12-30 VITALS — BP 99/55 | HR 75 | Temp 97.9°F | Resp 18

## 2014-12-30 DIAGNOSIS — K2100 Gastro-esophageal reflux disease with esophagitis, without bleeding: Secondary | ICD-10-CM

## 2014-12-30 DIAGNOSIS — C9000 Multiple myeloma not having achieved remission: Secondary | ICD-10-CM

## 2014-12-30 DIAGNOSIS — Z5112 Encounter for antineoplastic immunotherapy: Secondary | ICD-10-CM

## 2014-12-30 LAB — IGG, IGA, IGM
IGA: 43 mg/dL — AB (ref 69–380)
IGM, SERUM: 11 mg/dL — AB (ref 52–322)
IgG (Immunoglobin G), Serum: 1300 mg/dL (ref 690–1700)

## 2014-12-30 LAB — PROTEIN ELECTROPHORESIS, SERUM, WITH REFLEX
ABNORMAL PROTEIN BAND1: 0.7 g/dL
ALPHA-2-GLOBULIN: 0.7 g/dL (ref 0.5–0.9)
Albumin ELP: 3.9 g/dL (ref 3.8–4.8)
Alpha-1-Globulin: 0.3 g/dL (ref 0.2–0.3)
BETA 2: 0.3 g/dL (ref 0.2–0.5)
BETA GLOBULIN: 0.4 g/dL (ref 0.4–0.6)
Gamma Globulin: 1.3 g/dL (ref 0.8–1.7)
TOTAL PROTEIN, SERUM ELECTROPHOR: 6.8 g/dL (ref 6.1–8.1)

## 2014-12-30 LAB — RETICULOCYTES (CHCC)
ABS Retic: 35.9 10*3/uL (ref 19.0–186.0)
RBC.: 3.26 MIL/uL — AB (ref 3.87–5.11)
RETIC CT PCT: 1.1 % (ref 0.4–2.3)

## 2014-12-30 LAB — IFE INTERPRETATION

## 2014-12-30 LAB — KAPPA/LAMBDA LIGHT CHAINS
KAPPA FREE LGHT CHN: 3.09 mg/dL — AB (ref 0.33–1.94)
KAPPA LAMBDA RATIO: 2.94 — AB (ref 0.26–1.65)
LAMBDA FREE LGHT CHN: 1.05 mg/dL (ref 0.57–2.63)

## 2014-12-30 LAB — ERYTHROPOIETIN: ERYTHROPOIETIN: 12.7 m[IU]/mL (ref 2.6–18.5)

## 2014-12-30 MED ORDER — BORTEZOMIB CHEMO SQ INJECTION 3.5 MG (2.5MG/ML)
1.3000 mg/m2 | Freq: Once | INTRAMUSCULAR | Status: AC
  Administered 2014-12-30: 2.75 mg via SUBCUTANEOUS
  Filled 2014-12-30: qty 2.75

## 2014-12-30 MED ORDER — ONDANSETRON HCL 8 MG PO TABS
ORAL_TABLET | ORAL | Status: AC
  Filled 2014-12-30: qty 1

## 2014-12-30 MED ORDER — ONDANSETRON HCL 8 MG PO TABS
8.0000 mg | ORAL_TABLET | Freq: Once | ORAL | Status: AC
  Administered 2014-12-30: 8 mg via ORAL

## 2014-12-30 MED ORDER — FAMOTIDINE 40 MG PO TABS: 40.0000 mg | ORAL_TABLET | Freq: Two times a day (BID) | ORAL | Status: DC

## 2014-12-30 NOTE — Patient Instructions (Signed)
Bortezomib injection What is this medicine? BORTEZOMIB (bor TEZ oh mib) is a chemotherapy drug. It slows the growth of cancer cells. This medicine is used to treat multiple myeloma, and certain lymphomas, such as mantle-cell lymphoma. This medicine may be used for other purposes; ask your health care provider or pharmacist if you have questions. COMMON BRAND NAME(S): Velcade What should I tell my health care provider before I take this medicine? They need to know if you have any of these conditions: -diabetes -heart disease -irregular heartbeat -liver disease -on hemodialysis -low blood counts, like low white blood cells, platelets, or hemoglobin -peripheral neuropathy -taking medicine for blood pressure -an unusual or allergic reaction to bortezomib, mannitol, boron, other medicines, foods, dyes, or preservatives -pregnant or trying to get pregnant -breast-feeding How should I use this medicine? This medicine is for injection into a vein or for injection under the skin. It is given by a health care professional in a hospital or clinic setting. Talk to your pediatrician regarding the use of this medicine in children. Special care may be needed. Overdosage: If you think you have taken too much of this medicine contact a poison control center or emergency room at once. NOTE: This medicine is only for you. Do not share this medicine with others. What if I miss a dose? It is important not to miss your dose. Call your doctor or health care professional if you are unable to keep an appointment. What may interact with this medicine? This medicine may interact with the following medications: -ketoconazole -rifampin -ritonavir -St. John's Wort This list may not describe all possible interactions. Give your health care provider a list of all the medicines, herbs, non-prescription drugs, or dietary supplements you use. Also tell them if you smoke, drink alcohol, or use illegal drugs. Some items  may interact with your medicine. What should I watch for while using this medicine? Visit your doctor for checks on your progress. This drug may make you feel generally unwell. This is not uncommon, as chemotherapy can affect healthy cells as well as cancer cells. Report any side effects. Continue your course of treatment even though you feel ill unless your doctor tells you to stop. You may get drowsy or dizzy. Do not drive, use machinery, or do anything that needs mental alertness until you know how this medicine affects you. Do not stand or sit up quickly, especially if you are an older patient. This reduces the risk of dizzy or fainting spells. In some cases, you may be given additional medicines to help with side effects. Follow all directions for their use. Call your doctor or health care professional for advice if you get a fever, chills or sore throat, or other symptoms of a cold or flu. Do not treat yourself. This drug decreases your body's ability to fight infections. Try to avoid being around people who are sick. This medicine may increase your risk to bruise or bleed. Call your doctor or health care professional if you notice any unusual bleeding. You may need blood work done while you are taking this medicine. In some patients, this medicine may cause a serious brain infection that may cause death. If you have any problems seeing, thinking, speaking, walking, or standing, tell your doctor right away. If you cannot reach your doctor, urgently seek other source of medical care. Do not become pregnant while taking this medicine. Women should inform their doctor if they wish to become pregnant or think they might be pregnant. There is   a potential for serious side effects to an unborn child. Talk to your health care professional or pharmacist for more information. Do not breast-feed an infant while taking this medicine. Check with your doctor or health care professional if you get an attack of  severe diarrhea, nausea and vomiting, or if you sweat a lot. The loss of too much body fluid can make it dangerous for you to take this medicine. What side effects may I notice from receiving this medicine? Side effects that you should report to your doctor or health care professional as soon as possible: -allergic reactions like skin rash, itching or hives, swelling of the face, lips, or tongue -breathing problems -changes in hearing -changes in vision -fast, irregular heartbeat -feeling faint or lightheaded, falls -pain, tingling, numbness in the hands or feet -right upper belly pain -seizures -swelling of the ankles, feet, hands -unusual bleeding or bruising -unusually weak or tired -vomiting -yellowing of the eyes or skin Side effects that usually do not require medical attention (report to your doctor or health care professional if they continue or are bothersome): -changes in emotions or moods -constipation -diarrhea -loss of appetite -headache -irritation at site where injected -nausea This list may not describe all possible side effects. Call your doctor for medical advice about side effects. You may report side effects to FDA at 1-800-FDA-1088. Where should I keep my medicine? This drug is given in a hospital or clinic and will not be stored at home. NOTE: This sheet is a summary. It may not cover all possible information. If you have questions about this medicine, talk to your doctor, pharmacist, or health care provider.  2015, Elsevier/Gold Standard. (2013-02-19 12:46:32)  

## 2014-12-31 ENCOUNTER — Telehealth: Payer: Self-pay | Admitting: *Deleted

## 2014-12-31 NOTE — Telephone Encounter (Addendum)
Patient aware of results  ----- Message from [name], MD sent at 12/30/2014  5:32 PM EDT ----- Call and tell her that that the myeloma level is now down to 0.7. Thanks

## 2015-01-02 ENCOUNTER — Inpatient Hospital Stay: Payer: Commercial Managed Care - HMO

## 2015-01-02 ENCOUNTER — Other Ambulatory Visit: Payer: Commercial Managed Care - HMO

## 2015-01-02 ENCOUNTER — Ambulatory Visit (HOSPITAL_BASED_OUTPATIENT_CLINIC_OR_DEPARTMENT_OTHER): Payer: Commercial Managed Care - HMO | Admitting: Hematology & Oncology

## 2015-01-02 ENCOUNTER — Encounter: Payer: Self-pay | Admitting: Hematology & Oncology

## 2015-01-02 ENCOUNTER — Ambulatory Visit: Payer: Commercial Managed Care - HMO

## 2015-01-02 VITALS — BP 121/53 | HR 76 | Temp 97.2°F | Resp 18 | Ht 64.0 in | Wt 170.0 lb

## 2015-01-02 DIAGNOSIS — R198 Other specified symptoms and signs involving the digestive system and abdomen: Secondary | ICD-10-CM | POA: Diagnosis not present

## 2015-01-02 DIAGNOSIS — C9000 Multiple myeloma not having achieved remission: Secondary | ICD-10-CM | POA: Diagnosis not present

## 2015-01-02 NOTE — Progress Notes (Signed)
Hematology and Oncology Follow Up Visit  ALLIAH BOULANGER [id number] 11-14-1955 59 y.o. 01/02/2015   Principle Diagnosis:  IgG kappa myeloma   Current Therapy:     Velcade/Revlimid/ -Velcade every 2 week dosing  Zometa 4 mg IV every month     Interim History:  Ms.  Lanahan is back for a follow-up. She is having more abdominal issues. I am not sure exactly as to what is going on. She's having this epigastric discomfort. There is no radiating pain. There is no diarrhea. She's had no vomiting.  I'll not sure at all as to what medicines she is taking. She is not sure as what medications she is taking. I went through her medications and discontinue some which I do not think that she needed. I stopped the Famvir. I stopped the Decadron. Is possible she may have some gastritis. She supposed be taking Pepcid. I'm not sure she is doing this.  As far as her myeloma is concerned, she's done quite well with this. Her last monoclonal spike was 0.7 g/dL. Her. IgG level was 1300 mg/dL. Her kappa light chain was 3.1 mg/dL.  She's had no increased back issues. She kyphoplasty for a vertebral fracture.  She's had no leg swelling.  She's had no headache.  Currently, her performance status is ECOG 1    Medications:  Current outpatient prescriptions:  .  acetaminophen (TYLENOL) 500 MG tablet, Take 500 mg by mouth every 6 (six) hours as needed for mild pain or headache., Disp: , Rfl:  .  aspirin 325 MG tablet, Take 325 mg by mouth daily., Disp: , Rfl:  .  belladona alk-PHENObarbital (DONNATAL) 16.2 MG tablet, Take 1-2 pills, Every 8 hours AS NEEDED, for abdominal spasms., Disp: 60 tablet, Rfl: 1 .  Cholecalciferol (VITAMIN D) 1000 UNITS capsule, Take 2,000 Units by mouth 2 (two) times daily. 2 IN AM AND 2 IN PM, Disp: , Rfl:  .  famotidine (PEPCID) 40 MG tablet, Take 1 tablet (40 mg total) by mouth 2 (two) times daily., Disp: 30 tablet, Rfl: 6 .  fluocinonide cream (LIDEX) 0.05 %, APPLY TO THE  AFFECTED AREAS ON BODY TWICE DAILY UPON IRRITATION ONLY, Disp: , Rfl: 2 .  Lenalidomide 20 MG CAPS, Take 1 capsule at bedtime daily for 21 days and then off for 7 days. Auth# [id number], Disp: 21 capsule, Rfl: 4 .  Multiple Vitamin (MULTIVITAMIN) tablet, Take 1 tablet by mouth every evening. , Disp: , Rfl:  .  ondansetron (ZOFRAN) 8 MG tablet, Take 64m twice a day. Start the day after chemo for 2 days. Then take for nausea and vomiting twice a day as needed., Disp: 30 tablet, Rfl: 3 .  REVLIMID 20 MG CAPS, TAKE 1 CAPSULE BY MOUTH AT BEDTIME FOR 21 DAYS, THEN 7 DAYS OFF, Disp: 21 capsule, Rfl: 6 .  senna-docusate (SENOKOT-S) 8.6-50 MG per tablet, Take 4 tablets by mouth 2 (two) times daily., Disp: , Rfl:  .  spironolactone (ALDACTONE) 50 MG tablet, Take 50 mg by mouth daily., Disp: , Rfl: 4 .  sulfamethoxazole-trimethoprim (BACTRIM DS,SEPTRA DS) 800-160 MG per tablet, Take 1 tablet by mouth 2 (two) times daily. For 3 days, Disp: 6 tablet, Rfl: 0 .  torsemide (DEMADEX) 20 MG tablet, Take 1 tablet (20 mg total) by mouth daily as needed., Disp: 30 tablet, Rfl: 6  Allergies:  Allergies  Allergen Reactions  . Codeine Palpitations    Past Medical History, Surgical history, Social history, and Family History were reviewed and  updated.  Review of Systems: As above  Physical Exam:  height is 5' 4"  (1.626 m) and weight is 170 lb (77.111 kg). Her oral temperature is 97.2 F (36.2 C). Her blood pressure is 121/53 and her pulse is 76. Her respiration is 18.   Well-developed and well-nourished African American female. Head and neck exam shows no ocular or oral lesions. There are no palpable cervical or supraclavicular lymph nodes. Lungs are clear. Cardiac exam regular rate and rhythm with no murmurs, rubs or bruits. Abdomen is soft. She is mildly obese. She is no palpable liver or spleen tip. Back exam shows no tenderness over the spine, ribs or hips. Extremity shows no clubbing, cyanosis or edema. Skin exam  shows no rashes, ecchymoses or petechia. Neurological exam is nonfocal.  Lab Results  Component Value Date   WBC 2.6* 12/25/2014   HGB 9.9* 12/25/2014   HCT 29.4* 12/25/2014   MCV 91 12/25/2014   PLT 158 12/25/2014     Chemistry      Component Value Date/Time   NA 139 12/25/2014 0816   NA 143 12/02/2014 1319   NA 137 08/22/2014 0956   K 4.0 12/25/2014 0816   K 3.5 12/02/2014 1319   K 3.8 08/22/2014 0956   CL 102 12/02/2014 1319   CL 99 08/22/2014 0956   CO2 29 12/25/2014 0816   CO2 29 12/02/2014 1319   CO2 30 08/22/2014 0956   BUN 17.8 12/25/2014 0816   BUN 15 12/02/2014 1319   BUN 16 08/22/2014 0956   CREATININE 0.8 12/25/2014 0816   CREATININE 0.8 12/02/2014 1319   CREATININE 0.95 08/22/2014 0956      Component Value Date/Time   CALCIUM 9.5 12/25/2014 0816   CALCIUM 9.7 12/02/2014 1319   CALCIUM 9.2 08/22/2014 0956   ALKPHOS 47 12/25/2014 0816   ALKPHOS 41 12/02/2014 1319   ALKPHOS 47 06/03/2014 0122   AST 20 12/25/2014 0816   AST 33 12/02/2014 1319   AST 40* 06/03/2014 0122   ALT 20 12/25/2014 0816   ALT 33 12/02/2014 1319   ALT 43* 06/03/2014 0122   BILITOT 0.56 12/25/2014 0816   BILITOT 0.90 12/02/2014 1319   BILITOT 0.5 06/03/2014 0122         Impression and Plan: Ms. Manter is 59 year old African-American female with IgG kappa myeloma.   She really is doing quite well.   Hopefully, we can keep her on the every other week Velcade. I have been dropping her Revlimid dose gradually. I have her on 20 mg a day.  Hopefully, with the medication adjustments, her abdominal issues will improve.  I just am not sure she is taking the medication that she is supposed to.  She and her husband are going to [place] over Labor Day weekend. As such, we will move her next treatment back 1 week.  I want to see her back myself in one month.  I spent about 30 minutes with them today.    [name], MD 8/25/20169:47 AM

## 2015-01-14 ENCOUNTER — Other Ambulatory Visit: Payer: Self-pay | Admitting: Hematology & Oncology

## 2015-01-14 ENCOUNTER — Inpatient Hospital Stay: Payer: Commercial Managed Care - HMO

## 2015-01-17 ENCOUNTER — Other Ambulatory Visit: Payer: Self-pay | Admitting: *Deleted

## 2015-01-17 ENCOUNTER — Ambulatory Visit: Payer: Commercial Managed Care - HMO

## 2015-01-17 DIAGNOSIS — C9000 Multiple myeloma not having achieved remission: Secondary | ICD-10-CM

## 2015-01-17 MED ORDER — LENALIDOMIDE 20 MG PO CAPS: ORAL_CAPSULE | ORAL | Status: DC

## 2015-01-21 ENCOUNTER — Ambulatory Visit: Payer: Commercial Managed Care - HMO

## 2015-01-21 ENCOUNTER — Ambulatory Visit (HOSPITAL_BASED_OUTPATIENT_CLINIC_OR_DEPARTMENT_OTHER): Payer: Commercial Managed Care - HMO | Admitting: Nurse Practitioner

## 2015-01-21 VITALS — BP 113/52 | HR 77 | Temp 98.1°F | Resp 18

## 2015-01-21 DIAGNOSIS — Z5112 Encounter for antineoplastic immunotherapy: Secondary | ICD-10-CM | POA: Diagnosis not present

## 2015-01-21 DIAGNOSIS — C9000 Multiple myeloma not having achieved remission: Secondary | ICD-10-CM | POA: Diagnosis not present

## 2015-01-21 LAB — CMP (CANCER CENTER ONLY)
ALK PHOS: 39 U/L (ref 26–84)
ALT: 24 U/L (ref 10–47)
AST: 25 U/L (ref 11–38)
Albumin: 3.8 g/dL (ref 3.3–5.5)
BUN, Bld: 18 mg/dL (ref 7–22)
CALCIUM: 9.6 mg/dL (ref 8.0–10.3)
CO2: 29 mEq/L (ref 18–33)
Chloride: 98 mEq/L (ref 98–108)
Creat: 1.4 mg/dl — ABNORMAL HIGH (ref 0.6–1.2)
Glucose, Bld: 87 mg/dL (ref 73–118)
POTASSIUM: 4.2 meq/L (ref 3.3–4.7)
Sodium: 140 mEq/L (ref 128–145)
TOTAL PROTEIN: 7.7 g/dL (ref 6.4–8.1)
Total Bilirubin: 0.7 mg/dl (ref 0.20–1.60)

## 2015-01-21 LAB — CBC WITH DIFFERENTIAL (CANCER CENTER ONLY)
BASO#: 0 10*3/uL (ref 0.0–0.2)
BASO%: 0.3 % (ref 0.0–2.0)
EOS%: 3.8 % (ref 0.0–7.0)
Eosinophils Absolute: 0.1 10*3/uL (ref 0.0–0.5)
HEMATOCRIT: 30 % — AB (ref 34.8–46.6)
HGB: 9.8 g/dL — ABNORMAL LOW (ref 11.6–15.9)
LYMPH#: 1 10*3/uL (ref 0.9–3.3)
LYMPH%: 33.3 % (ref 14.0–48.0)
MCH: 30.1 pg (ref 26.0–34.0)
MCHC: 32.7 g/dL (ref 32.0–36.0)
MCV: 92 fL (ref 81–101)
MONO#: 0.4 10*3/uL (ref 0.1–0.9)
MONO%: 14.1 % — ABNORMAL HIGH (ref 0.0–13.0)
NEUT#: 1.4 10*3/uL — ABNORMAL LOW (ref 1.5–6.5)
NEUT%: 48.5 % (ref 39.6–80.0)
Platelets: 197 10*3/uL (ref 145–400)
RBC: 3.26 10*6/uL — ABNORMAL LOW (ref 3.70–5.32)
RDW: 14.9 % (ref 11.1–15.7)
WBC: 2.9 10*3/uL — ABNORMAL LOW (ref 3.9–10.0)

## 2015-01-21 MED ORDER — BORTEZOMIB CHEMO SQ INJECTION 3.5 MG (2.5MG/ML)
1.3000 mg/m2 | Freq: Once | INTRAMUSCULAR | Status: AC
  Administered 2015-01-21: 2.75 mg via SUBCUTANEOUS
  Filled 2015-01-21: qty 2.75

## 2015-01-21 MED ORDER — ONDANSETRON HCL 8 MG PO TABS
8.0000 mg | ORAL_TABLET | Freq: Once | ORAL | Status: AC
  Administered 2015-01-21: 8 mg via ORAL

## 2015-01-21 MED ORDER — ONDANSETRON HCL 8 MG PO TABS
ORAL_TABLET | ORAL | Status: AC
  Filled 2015-01-21: qty 1

## 2015-01-21 NOTE — Patient Instructions (Signed)
Cancer Center Discharge Instructions for Patients Receiving Chemotherapy  Today you received the following chemotherapy agents:  Velcade  To help prevent nausea and vomiting after your treatment, we encourage you to take your nausea medication as prescribed.   If you develop nausea and vomiting that is not controlled by your nausea medication, call the clinic.   BELOW ARE SYMPTOMS THAT SHOULD BE REPORTED IMMEDIATELY:  *FEVER GREATER THAN 100.5 F  *CHILLS WITH OR WITHOUT FEVER  NAUSEA AND VOMITING THAT IS NOT CONTROLLED WITH YOUR NAUSEA MEDICATION  *UNUSUAL SHORTNESS OF BREATH  *UNUSUAL BRUISING OR BLEEDING  TENDERNESS IN MOUTH AND THROAT WITH OR WITHOUT PRESENCE OF ULCERS  *URINARY PROBLEMS  *BOWEL PROBLEMS  UNUSUAL RASH Items with * indicate a potential emergency and should be followed up as soon as possible.  Feel free to call the clinic you have any questions or concerns. The clinic phone number is [phone].  Please show the CHEMO ALERT CARD at check-in to the Emergency Department and triage nurse.   

## 2015-01-23 LAB — PROTEIN ELECTROPHORESIS, SERUM, WITH REFLEX
ALBUMIN ELP: 4 g/dL (ref 3.8–4.8)
ALPHA-1-GLOBULIN: 0.4 g/dL — AB (ref 0.2–0.3)
ALPHA-2-GLOBULIN: 0.7 g/dL (ref 0.5–0.9)
Abnormal Protein Band1: 0.8 g/dL
BETA 2: 0.3 g/dL (ref 0.2–0.5)
BETA GLOBULIN: 0.4 g/dL (ref 0.4–0.6)
Gamma Globulin: 1.5 g/dL (ref 0.8–1.7)
Total Protein, Serum Electrophoresis: 7.3 g/dL (ref 6.1–8.1)

## 2015-01-23 LAB — IGG, IGA, IGM
IGG (IMMUNOGLOBIN G), SERUM: 1940 mg/dL — AB (ref 690–1700)
IgA: 81 mg/dL (ref 69–380)
IgM, Serum: 10 mg/dL — ABNORMAL LOW (ref 52–322)

## 2015-01-23 LAB — IFE INTERPRETATION

## 2015-01-23 LAB — KAPPA/LAMBDA LIGHT CHAINS
Kappa free light chain: 5.92 mg/dL — ABNORMAL HIGH (ref 0.33–1.94)
Kappa:Lambda Ratio: 3.54 — ABNORMAL HIGH (ref 0.26–1.65)
Lambda Free Lght Chn: 1.67 mg/dL (ref 0.57–2.63)

## 2015-01-27 ENCOUNTER — Inpatient Hospital Stay: Payer: Commercial Managed Care - HMO

## 2015-01-27 ENCOUNTER — Other Ambulatory Visit: Payer: Commercial Managed Care - HMO

## 2015-01-27 ENCOUNTER — Ambulatory Visit: Payer: Commercial Managed Care - HMO | Admitting: Hematology & Oncology

## 2015-02-04 ENCOUNTER — Other Ambulatory Visit: Payer: Self-pay | Admitting: *Deleted

## 2015-02-04 ENCOUNTER — Ambulatory Visit (HOSPITAL_BASED_OUTPATIENT_CLINIC_OR_DEPARTMENT_OTHER): Payer: Commercial Managed Care - HMO | Admitting: Hematology & Oncology

## 2015-02-04 ENCOUNTER — Encounter: Payer: Self-pay | Admitting: Hematology & Oncology

## 2015-02-04 ENCOUNTER — Ambulatory Visit (HOSPITAL_BASED_OUTPATIENT_CLINIC_OR_DEPARTMENT_OTHER): Payer: Commercial Managed Care - HMO

## 2015-02-04 ENCOUNTER — Other Ambulatory Visit (HOSPITAL_BASED_OUTPATIENT_CLINIC_OR_DEPARTMENT_OTHER): Payer: Commercial Managed Care - HMO

## 2015-02-04 VITALS — BP 124/64 | HR 80 | Temp 97.4°F | Resp 18 | Ht 64.0 in | Wt 171.0 lb

## 2015-02-04 DIAGNOSIS — C9000 Multiple myeloma not having achieved remission: Secondary | ICD-10-CM

## 2015-02-04 DIAGNOSIS — Z5112 Encounter for antineoplastic immunotherapy: Secondary | ICD-10-CM

## 2015-02-04 LAB — CBC WITH DIFFERENTIAL (CANCER CENTER ONLY)
BASO#: 0 10*3/uL (ref 0.0–0.2)
BASO%: 0.4 % (ref 0.0–2.0)
EOS ABS: 0.1 10*3/uL (ref 0.0–0.5)
EOS%: 3.5 % (ref 0.0–7.0)
HEMATOCRIT: 29.9 % — AB (ref 34.8–46.6)
HGB: 9.8 g/dL — ABNORMAL LOW (ref 11.6–15.9)
LYMPH#: 0.8 10*3/uL — ABNORMAL LOW (ref 0.9–3.3)
LYMPH%: 31.9 % (ref 14.0–48.0)
MCH: 30 pg (ref 26.0–34.0)
MCHC: 32.8 g/dL (ref 32.0–36.0)
MCV: 91 fL (ref 81–101)
MONO#: 0.4 10*3/uL (ref 0.1–0.9)
MONO%: 16.5 % — ABNORMAL HIGH (ref 0.0–13.0)
NEUT#: 1.2 10*3/uL — ABNORMAL LOW (ref 1.5–6.5)
NEUT%: 47.7 % (ref 39.6–80.0)
PLATELETS: 191 10*3/uL (ref 145–400)
RBC: 3.27 10*6/uL — ABNORMAL LOW (ref 3.70–5.32)
RDW: 14.7 % (ref 11.1–15.7)
WBC: 2.5 10*3/uL — ABNORMAL LOW (ref 3.9–10.0)

## 2015-02-04 LAB — BASIC METABOLIC PANEL
BUN: 19 mg/dL (ref 7–25)
CHLORIDE: 100 mmol/L (ref 98–110)
CO2: 28 mmol/L (ref 20–31)
Calcium: 9.6 mg/dL (ref 8.6–10.4)
Creatinine, Ser: 1.07 mg/dL — ABNORMAL HIGH (ref 0.50–0.99)
GLUCOSE: 91 mg/dL (ref 65–99)
POTASSIUM: 3.9 mmol/L (ref 3.5–5.3)
SODIUM: 138 mmol/L (ref 135–146)

## 2015-02-04 LAB — URINALYSIS, MICROSCOPIC (CHCC SATELLITE)
BILIRUBIN (URINE): NEGATIVE
Glucose: NEGATIVE mg/dL
Ketones: NEGATIVE mg/dL
NITRITE: NEGATIVE
PH: 6.5 (ref 4.60–8.00)
Protein: NEGATIVE mg/dL
Specific Gravity, Urine: 1.015 (ref 1.003–1.035)
Urobilinogen, UR: 0.2 mg/dL (ref 0.2–1)

## 2015-02-04 MED ORDER — SULFAMETHOXAZOLE-TRIMETHOPRIM 800-160 MG PO TABS: 1.0000 | ORAL_TABLET | Freq: Two times a day (BID) | ORAL | Status: DC

## 2015-02-04 MED ORDER — ONDANSETRON HCL 8 MG PO TABS
ORAL_TABLET | ORAL | Status: AC
  Filled 2015-02-04: qty 1

## 2015-02-04 MED ORDER — BORTEZOMIB CHEMO SQ INJECTION 3.5 MG (2.5MG/ML)
1.3000 mg/m2 | Freq: Once | INTRAMUSCULAR | Status: AC
  Administered 2015-02-04: 2.5 mg via SUBCUTANEOUS
  Filled 2015-02-04: qty 2.5

## 2015-02-04 MED ORDER — ONDANSETRON HCL 8 MG PO TABS
8.0000 mg | ORAL_TABLET | Freq: Once | ORAL | Status: AC
  Administered 2015-02-04: 8 mg via ORAL

## 2015-02-04 NOTE — Progress Notes (Signed)
Hematology and Oncology Follow Up Visit  Barbara Cook [id number] 1955-05-28 59 y.o. 02/04/2015   Principle Diagnosis:  IgG kappa myeloma   Current Therapy:     Velcade/Revlimid/ -Velcade every 2 week dosing  Zometa 4 mg IV every month     Interim History:  Ms.  Barbara Cook is back for a follow-up. She seems to be doing pretty well. She just complains of some abdominal discomfort.  She has an occasional back pain. She did have kyphoplasty for the back.  Her myeloma has been holding pretty stable. We did go ahead and repeat her myeloma studies with her last visit. Her M spike was 0.8 g/dL. Her IgG level was 1940 mg/dL. Her kappa light chain was 5.9 mg/dL. All these are relatively stable.  She's had no fever. She's had no bleeding.  Her appetite has been doing okay.  His been no change in bowel or bladder habits.  Currently, her performance status is ECOG 1    Medications:  Current outpatient prescriptions:  .  acetaminophen (TYLENOL) 500 MG tablet, Take 500 mg by mouth every 6 (six) hours as needed for mild pain or headache., Disp: , Rfl:  .  aspirin 325 MG tablet, Take 325 mg by mouth daily., Disp: , Rfl:  .  Cholecalciferol (VITAMIN D) 1000 UNITS capsule, Take 2,000 Units by mouth 2 (two) times daily. 2 IN AM AND 2 IN PM, Disp: , Rfl:  .  famotidine (PEPCID) 40 MG tablet, Take 1 tablet (40 mg total) by mouth 2 (two) times daily., Disp: 30 tablet, Rfl: 6 .  fluocinonide cream (LIDEX) 0.05 %, APPLY TO THE AFFECTED AREAS ON BODY TWICE DAILY UPON IRRITATION ONLY, Disp: , Rfl: 2 .  KLOR-CON 8 MEQ tablet, 1 TABLET ONCE A DAY ORALLY 30 DAY(S), Disp: , Rfl: 6 .  Lenalidomide 20 MG CAPS, Take 1 capsule at bedtime daily for 21 days and then off for 7 days. MNOT#[id number], Disp: 21 capsule, Rfl: 0 .  Multiple Vitamin (MULTIVITAMIN) tablet, Take 1 tablet by mouth every evening. , Disp: , Rfl:  .  ondansetron (ZOFRAN) 8 MG tablet, Take 66m twice a day. Start the day after chemo for 2 days.  Then take for nausea and vomiting twice a day as needed., Disp: 30 tablet, Rfl: 3 .  pantoprazole (PROTONIX) 40 MG tablet, TAKE 1 TABLET (40 MG TOTAL) BY MOUTH 2 (TWO) TIMES DAILY., Disp: , Rfl: 6 .  REVLIMID 20 MG CAPS, TAKE 1 CAPSULE BY MOUTH AT BEDTIME FOR 21 DAYS ON, THEN 7 DAYS OFF., Disp: 21 capsule, Rfl: 6 .  senna-docusate (SENOKOT-S) 8.6-50 MG per tablet, Take 4 tablets by mouth 2 (two) times daily., Disp: , Rfl:  .  spironolactone (ALDACTONE) 50 MG tablet, Take 50 mg by mouth daily., Disp: , Rfl: 4 .  sulfamethoxazole-trimethoprim (BACTRIM DS,SEPTRA DS) 800-160 MG per tablet, Take 1 tablet by mouth 2 (two) times daily. For 3 days, Disp: 6 tablet, Rfl: 0 .  torsemide (DEMADEX) 20 MG tablet, Take 1 tablet (20 mg total) by mouth daily as needed., Disp: 30 tablet, Rfl: 6 .  belladona alk-PHENObarbital (DONNATAL) 16.2 MG tablet, Take 1-2 pills, Every 8 hours AS NEEDED, for abdominal spasms. (Patient not taking: Reported on 02/04/2015), Disp: 60 tablet, Rfl: 1  Allergies:  Allergies  Allergen Reactions  . Codeine Palpitations    Past Medical History, Surgical history, Social history, and Family History were reviewed and updated.  Review of Systems: As above  Physical Exam:  height is  5' 4" (1.626 m) and weight is 171 lb (77.565 kg). Her oral temperature is 97.4 F (36.3 C). Her blood pressure is 124/64 and her pulse is 80. Her respiration is 18.   Well-developed and well-nourished African American female. Head and neck exam shows no ocular or oral lesions. There are no palpable cervical or supraclavicular lymph nodes. Lungs are clear. Cardiac exam regular rate and rhythm with no murmurs, rubs or bruits. Abdomen is soft. She is mildly obese. She really has no abdominal distention. She has no palpable liver or spleen tip. Back exam shows no tenderness over the spine, ribs or hips. Extremity shows no clubbing, cyanosis or edema. Skin exam shows no rashes, ecchymoses or petechia. Neurological  exam is nonfocal.  Lab Results  Component Value Date   WBC 2.5* 02/04/2015   HGB 9.8* 02/04/2015   HCT 29.9* 02/04/2015   MCV 91 02/04/2015   PLT 191 02/04/2015     Chemistry      Component Value Date/Time   NA 140 01/21/2015 0808   NA 139 12/25/2014 0816   NA 137 08/22/2014 0956   K 4.2 01/21/2015 0808   K 4.0 12/25/2014 0816   K 3.8 08/22/2014 0956   CL 98 01/21/2015 0808   CL 99 08/22/2014 0956   CO2 29 01/21/2015 0808   CO2 29 12/25/2014 0816   CO2 30 08/22/2014 0956   BUN 18 01/21/2015 0808   BUN 17.8 12/25/2014 0816   BUN 16 08/22/2014 0956   CREATININE 1.4* 01/21/2015 0808   CREATININE 0.8 12/25/2014 0816   CREATININE 0.95 08/22/2014 0956      Component Value Date/Time   CALCIUM 9.6 01/21/2015 0808   CALCIUM 9.5 12/25/2014 0816   CALCIUM 9.2 08/22/2014 0956   ALKPHOS 39 01/21/2015 0808   ALKPHOS 47 12/25/2014 0816   ALKPHOS 47 06/03/2014 0122   AST 25 01/21/2015 0808   AST 20 12/25/2014 0816   AST 40* 06/03/2014 0122   ALT 24 01/21/2015 0808   ALT 20 12/25/2014 0816   ALT 43* 06/03/2014 0122   BILITOT 0.70 01/21/2015 0808   BILITOT 0.56 12/25/2014 0816   BILITOT 0.5 06/03/2014 0122         Impression and Plan: Ms. Barbara Cook is 58 year old African-American female with IgG kappa myeloma.   So far, everything is holding pretty steady.  We will have to watch her myeloma studies. We'll have to see how the numbers change, if any area  I think as long as everything is holding steady, I would not get change with her protocol.  I will plan to see her back in another month.   I spent about 30 minutes with them today.    [name], MD 9/27/20169:01 AM

## 2015-02-04 NOTE — Patient Instructions (Signed)
Bortezomib injection What is this medicine? BORTEZOMIB (bor TEZ oh mib) is a chemotherapy drug. It slows the growth of cancer cells. This medicine is used to treat multiple myeloma, and certain lymphomas, such as mantle-cell lymphoma. This medicine may be used for other purposes; ask your health care provider or pharmacist if you have questions. COMMON BRAND NAME(S): Velcade What should I tell my health care provider before I take this medicine? They need to know if you have any of these conditions: -diabetes -heart disease -irregular heartbeat -liver disease -on hemodialysis -low blood counts, like low white blood cells, platelets, or hemoglobin -peripheral neuropathy -taking medicine for blood pressure -an unusual or allergic reaction to bortezomib, mannitol, boron, other medicines, foods, dyes, or preservatives -pregnant or trying to get pregnant -breast-feeding How should I use this medicine? This medicine is for injection into a vein or for injection under the skin. It is given by a health care professional in a hospital or clinic setting. Talk to your pediatrician regarding the use of this medicine in children. Special care may be needed. Overdosage: If you think you have taken too much of this medicine contact a poison control center or emergency room at once. NOTE: This medicine is only for you. Do not share this medicine with others. What if I miss a dose? It is important not to miss your dose. Call your doctor or health care professional if you are unable to keep an appointment. What may interact with this medicine? This medicine may interact with the following medications: -ketoconazole -rifampin -ritonavir -St. John's Wort This list may not describe all possible interactions. Give your health care provider a list of all the medicines, herbs, non-prescription drugs, or dietary supplements you use. Also tell them if you smoke, drink alcohol, or use illegal drugs. Some items  may interact with your medicine. What should I watch for while using this medicine? Visit your doctor for checks on your progress. This drug may make you feel generally unwell. This is not uncommon, as chemotherapy can affect healthy cells as well as cancer cells. Report any side effects. Continue your course of treatment even though you feel ill unless your doctor tells you to stop. You may get drowsy or dizzy. Do not drive, use machinery, or do anything that needs mental alertness until you know how this medicine affects you. Do not stand or sit up quickly, especially if you are an older patient. This reduces the risk of dizzy or fainting spells. In some cases, you may be given additional medicines to help with side effects. Follow all directions for their use. Call your doctor or health care professional for advice if you get a fever, chills or sore throat, or other symptoms of a cold or flu. Do not treat yourself. This drug decreases your body's ability to fight infections. Try to avoid being around people who are sick. This medicine may increase your risk to bruise or bleed. Call your doctor or health care professional if you notice any unusual bleeding. You may need blood work done while you are taking this medicine. In some patients, this medicine may cause a serious brain infection that may cause death. If you have any problems seeing, thinking, speaking, walking, or standing, tell your doctor right away. If you cannot reach your doctor, urgently seek other source of medical care. Do not become pregnant while taking this medicine. Women should inform their doctor if they wish to become pregnant or think they might be pregnant. There is   a potential for serious side effects to an unborn child. Talk to your health care professional or pharmacist for more information. Do not breast-feed an infant while taking this medicine. Check with your doctor or health care professional if you get an attack of  severe diarrhea, nausea and vomiting, or if you sweat a lot. The loss of too much body fluid can make it dangerous for you to take this medicine. What side effects may I notice from receiving this medicine? Side effects that you should report to your doctor or health care professional as soon as possible: -allergic reactions like skin rash, itching or hives, swelling of the face, lips, or tongue -breathing problems -changes in hearing -changes in vision -fast, irregular heartbeat -feeling faint or lightheaded, falls -pain, tingling, numbness in the hands or feet -right upper belly pain -seizures -swelling of the ankles, feet, hands -unusual bleeding or bruising -unusually weak or tired -vomiting -yellowing of the eyes or skin Side effects that usually do not require medical attention (report to your doctor or health care professional if they continue or are bothersome): -changes in emotions or moods -constipation -diarrhea -loss of appetite -headache -irritation at site where injected -nausea This list may not describe all possible side effects. Call your doctor for medical advice about side effects. You may report side effects to FDA at 1-800-FDA-1088. Where should I keep my medicine? This drug is given in a hospital or clinic and will not be stored at home. NOTE: This sheet is a summary. It may not cover all possible information. If you have questions about this medicine, talk to your doctor, pharmacist, or health care provider.  2015, Elsevier/Gold Standard. (2013-02-19 12:46:32)  

## 2015-02-06 LAB — URINE CULTURE

## 2015-02-07 ENCOUNTER — Ambulatory Visit (HOSPITAL_BASED_OUTPATIENT_CLINIC_OR_DEPARTMENT_OTHER): Payer: Commercial Managed Care - HMO

## 2015-02-07 VITALS — BP 117/45 | HR 68 | Temp 97.7°F | Resp 18

## 2015-02-07 DIAGNOSIS — C9000 Multiple myeloma not having achieved remission: Secondary | ICD-10-CM

## 2015-02-07 MED ORDER — ZOLEDRONIC ACID 4 MG/100ML IV SOLN
4.0000 mg | Freq: Once | INTRAVENOUS | Status: AC
  Administered 2015-02-07: 4 mg via INTRAVENOUS
  Filled 2015-02-07: qty 100

## 2015-02-07 NOTE — Patient Instructions (Signed)

## 2015-02-10 ENCOUNTER — Encounter: Payer: Self-pay | Admitting: Hematology & Oncology

## 2015-02-18 ENCOUNTER — Ambulatory Visit (HOSPITAL_BASED_OUTPATIENT_CLINIC_OR_DEPARTMENT_OTHER): Payer: Commercial Managed Care - HMO

## 2015-02-18 ENCOUNTER — Ambulatory Visit (HOSPITAL_BASED_OUTPATIENT_CLINIC_OR_DEPARTMENT_OTHER)
Admission: RE | Admit: 2015-02-18 | Discharge: 2015-02-18 | Disposition: A | Discharge status: Home / Self Care | Payer: Commercial Managed Care - HMO | Source: Ambulatory Visit | Attending: Hematology & Oncology | Admitting: Hematology & Oncology

## 2015-02-18 ENCOUNTER — Encounter: Payer: Self-pay | Admitting: Hematology & Oncology

## 2015-02-18 ENCOUNTER — Ambulatory Visit (HOSPITAL_BASED_OUTPATIENT_CLINIC_OR_DEPARTMENT_OTHER): Payer: Commercial Managed Care - HMO | Admitting: Hematology & Oncology

## 2015-02-18 VITALS — BP 110/60 | HR 90 | Temp 97.8°F | Resp 16 | Ht 64.0 in | Wt 167.0 lb

## 2015-02-18 DIAGNOSIS — M8580 Other specified disorders of bone density and structure, unspecified site: Secondary | ICD-10-CM | POA: Insufficient documentation

## 2015-02-18 DIAGNOSIS — G8929 Other chronic pain: Secondary | ICD-10-CM

## 2015-02-18 DIAGNOSIS — C9001 Multiple myeloma in remission: Secondary | ICD-10-CM | POA: Diagnosis not present

## 2015-02-18 DIAGNOSIS — M4185 Other forms of scoliosis, thoracolumbar region: Secondary | ICD-10-CM | POA: Diagnosis not present

## 2015-02-18 DIAGNOSIS — M546 Pain in thoracic spine: Principal | ICD-10-CM

## 2015-02-18 DIAGNOSIS — C9000 Multiple myeloma not having achieved remission: Secondary | ICD-10-CM

## 2015-02-18 DIAGNOSIS — C9002 Multiple myeloma in relapse: Secondary | ICD-10-CM

## 2015-02-18 DIAGNOSIS — Z5112 Encounter for antineoplastic immunotherapy: Secondary | ICD-10-CM | POA: Diagnosis not present

## 2015-02-18 DIAGNOSIS — M47895 Other spondylosis, thoracolumbar region: Secondary | ICD-10-CM | POA: Diagnosis not present

## 2015-02-18 LAB — CBC WITH DIFFERENTIAL (CANCER CENTER ONLY)
BASO#: 0 10*3/uL (ref 0.0–0.2)
BASO%: 0.9 % (ref 0.0–2.0)
EOS%: 4.6 % (ref 0.0–7.0)
Eosinophils Absolute: 0.2 10*3/uL (ref 0.0–0.5)
HCT: 29.6 % — ABNORMAL LOW (ref 34.8–46.6)
HGB: 9.8 g/dL — ABNORMAL LOW (ref 11.6–15.9)
LYMPH#: 0.8 10*3/uL — ABNORMAL LOW (ref 0.9–3.3)
LYMPH%: 21.6 % (ref 14.0–48.0)
MCH: 30.4 pg (ref 26.0–34.0)
MCHC: 33.1 g/dL (ref 32.0–36.0)
MCV: 92 fL (ref 81–101)
MONO#: 0.2 10*3/uL (ref 0.1–0.9)
MONO%: 5.5 % (ref 0.0–13.0)
NEUT#: 2.3 10*3/uL (ref 1.5–6.5)
NEUT%: 67.4 % (ref 39.6–80.0)
Platelets: 177 10*3/uL (ref 145–400)
RBC: 3.22 10*6/uL — ABNORMAL LOW (ref 3.70–5.32)
RDW: 14.2 % (ref 11.1–15.7)
WBC: 3.5 10*3/uL — ABNORMAL LOW (ref 3.9–10.0)

## 2015-02-18 LAB — CMP (CANCER CENTER ONLY)
ALBUMIN: 3.8 g/dL (ref 3.3–5.5)
ALT(SGPT): 19 U/L (ref 10–47)
AST: 24 U/L (ref 11–38)
Alkaline Phosphatase: 46 U/L (ref 26–84)
BILIRUBIN TOTAL: 0.8 mg/dL (ref 0.20–1.60)
BUN, Bld: 19 mg/dL (ref 7–22)
CALCIUM: 9.4 mg/dL (ref 8.0–10.3)
CO2: 28 mEq/L (ref 18–33)
CREATININE: 1.1 mg/dL (ref 0.6–1.2)
Chloride: 102 mEq/L (ref 98–108)
Glucose, Bld: 87 mg/dL (ref 73–118)
Potassium: 3.9 mEq/L (ref 3.3–4.7)
SODIUM: 137 meq/L (ref 128–145)
TOTAL PROTEIN: 8 g/dL (ref 6.4–8.1)

## 2015-02-18 MED ORDER — BORTEZOMIB CHEMO SQ INJECTION 3.5 MG (2.5MG/ML)
1.1700 mg/m2 | Freq: Once | INTRAMUSCULAR | Status: AC
  Administered 2015-02-18: 2.25 mg via SUBCUTANEOUS
  Filled 2015-02-18: qty 2.25

## 2015-02-18 MED ORDER — ONDANSETRON HCL 8 MG PO TABS
8.0000 mg | ORAL_TABLET | Freq: Once | ORAL | Status: AC
  Administered 2015-02-18: 8 mg via ORAL

## 2015-02-18 MED ORDER — ONDANSETRON HCL 8 MG PO TABS
ORAL_TABLET | ORAL | Status: AC
  Filled 2015-02-18: qty 1

## 2015-02-18 NOTE — Patient Instructions (Signed)
Bortezomib injection What is this medicine? BORTEZOMIB (bor TEZ oh mib) is a medicine that targets proteins in cancer cells and stops the cancer cells from growing. It is used to treat multiple myeloma and mantle-cell lymphoma. This medicine may be used for other purposes; ask your health care provider or pharmacist if you have questions. What should I tell my health care provider before I take this medicine? They need to know if you have any of these conditions: -diabetes -heart disease -irregular heartbeat -liver disease -on hemodialysis -low blood counts, like low white blood cells, platelets, or hemoglobin -peripheral neuropathy -taking medicine for blood pressure -an unusual or allergic reaction to bortezomib, mannitol, boron, other medicines, foods, dyes, or preservatives -pregnant or trying to get pregnant -breast-feeding How should I use this medicine? This medicine is for injection into a vein or for injection under the skin. It is given by a health care professional in a hospital or clinic setting. Talk to your pediatrician regarding the use of this medicine in children. Special care may be needed. Overdosage: If you think you have taken too much of this medicine contact a poison control center or emergency room at once. NOTE: This medicine is only for you. Do not share this medicine with others. What if I miss a dose? It is important not to miss your dose. Call your doctor or health care professional if you are unable to keep an appointment. What may interact with this medicine? This medicine may interact with the following medications: -ketoconazole -rifampin -ritonavir -St. John's Wort This list may not describe all possible interactions. Give your health care provider a list of all the medicines, herbs, non-prescription drugs, or dietary supplements you use. Also tell them if you smoke, drink alcohol, or use illegal drugs. Some items may interact with your medicine. What  should I watch for while using this medicine? Visit your doctor for checks on your progress. This drug may make you feel generally unwell. This is not uncommon, as chemotherapy can affect healthy cells as well as cancer cells. Report any side effects. Continue your course of treatment even though you feel ill unless your doctor tells you to stop. You may get drowsy or dizzy. Do not drive, use machinery, or do anything that needs mental alertness until you know how this medicine affects you. Do not stand or sit up quickly, especially if you are an older patient. This reduces the risk of dizzy or fainting spells. In some cases, you may be given additional medicines to help with side effects. Follow all directions for their use. Call your doctor or health care professional for advice if you get a fever, chills or sore throat, or other symptoms of a cold or flu. Do not treat yourself. This drug decreases your body's ability to fight infections. Try to avoid being around people who are sick. This medicine may increase your risk to bruise or bleed. Call your doctor or health care professional if you notice any unusual bleeding. You may need blood work done while you are taking this medicine. In some patients, this medicine may cause a serious brain infection that may cause death. If you have any problems seeing, thinking, speaking, walking, or standing, tell your doctor right away. If you cannot reach your doctor, urgently seek other source of medical care. Do not become pregnant while taking this medicine. Women should inform their doctor if they wish to become pregnant or think they might be pregnant. There is a potential for serious  side effects to an unborn child. Talk to your health care professional or pharmacist for more information. Do not breast-feed an infant while taking this medicine. Check with your doctor or health care professional if you get an attack of severe diarrhea, nausea and vomiting, or if  you sweat a lot. The loss of too much body fluid can make it dangerous for you to take this medicine. What side effects may I notice from receiving this medicine? Side effects that you should report to your doctor or health care professional as soon as possible: -allergic reactions like skin rash, itching or hives, swelling of the face, lips, or tongue -breathing problems -changes in hearing -changes in vision -fast, irregular heartbeat -feeling faint or lightheaded, falls -pain, tingling, numbness in the hands or feet -right upper belly pain -seizures -swelling of the ankles, feet, hands -unusual bleeding or bruising -unusually weak or tired -vomiting -yellowing of the eyes or skin Side effects that usually do not require medical attention (report to your doctor or health care professional if they continue or are bothersome): -changes in emotions or moods -constipation -diarrhea -loss of appetite -headache -irritation at site where injected -nausea This list may not describe all possible side effects. Call your doctor for medical advice about side effects. You may report side effects to FDA at 1-800-FDA-1088. Where should I keep my medicine? This drug is given in a hospital or clinic and will not be stored at home. NOTE: This sheet is a summary. It may not cover all possible information. If you have questions about this medicine, talk to your doctor, pharmacist, or health care provider.    2016, [employer] Standard. (2014-06-25 14:47:04)

## 2015-02-18 NOTE — Progress Notes (Signed)
Hematology and Oncology Follow Up Visit  Barbara Cook [id number] 03/21/1956 59 y.o. 02/18/2015   Principle Diagnosis:  IgG kappa myeloma   Current Therapy:     Velcade/Revlimid/ -Velcade every 2 week dosing  Zometa 4 mg IV every month     Interim History:  Barbara Cook is back for a follow-up. She seems to be doing pretty well.   Her husband thinks that she just is not doing all that much. She does not have a lot of energy. She does not have a lot of motivation. It may be that she has an element of depression. She does not want anything to try to help with this right now.  She has a little bit of back discomfort. This is in the middle thoracic area. I will get some x-rays to see if there is any changes.  Her last monoclonal studies done on September 13 showed an M spike of 0.8 g/dL. Her IgG level was 1940 mg/dL. Her Kappa Lightchain was 5.9 mg/dL. These are up a little bit. We will certainly have to keep a close eye on this.  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 This might be from the Velcade. I will cut her Velcade dose back by 10% and see if this makes a difference.  Her appetite seems to be doing okay. She's not lost any weight. She has little bit of swelling in the lower legs. This might be from some anemia.  Her erythropoietin level is only 12.7. As such, she might benefit from Aranesp. I'm not sure she would take this knowing some of the potential side effects.  Currently, her performance status is ECOG 1    Medications:  Current outpatient prescriptions:  .  acetaminophen (TYLENOL) 500 MG tablet, Take 500 mg by mouth every 6 (six) hours as needed for mild pain or headache., Disp: , Rfl:  .  aspirin 325 MG tablet, Take 325 mg by mouth daily., Disp: , Rfl:  .  belladona alk-PHENObarbital (DONNATAL) 16.2 MG tablet, Take 1-2 pills, Every 8 hours AS NEEDED, for abdominal spasms., Disp: 60 tablet, Rfl: 1 .  Cholecalciferol (VITAMIN D) 1000 UNITS capsule, Take 2,000  Units by mouth 2 (two) times daily. 2 IN AM AND 2 IN PM, Disp: , Rfl:  .  famotidine (PEPCID) 40 MG tablet, Take 1 tablet (40 mg total) by mouth 2 (two) times daily., Disp: 30 tablet, Rfl: 6 .  fluocinonide cream (LIDEX) 0.05 %, APPLY TO THE AFFECTED AREAS ON BODY TWICE DAILY UPON IRRITATION ONLY, Disp: , Rfl: 2 .  KLOR-CON 8 MEQ tablet, 1 TABLET ONCE A DAY ORALLY 30 DAY(S), Disp: , Rfl: 6 .  Lenalidomide 20 MG CAPS, Take 1 capsule at bedtime daily for 21 days and then off for 7 days. IFOY#[id number], Disp: 21 capsule, Rfl: 0 .  Multiple Vitamin (MULTIVITAMIN) tablet, Take 1 tablet by mouth every evening. , Disp: , Rfl:  .  ondansetron (ZOFRAN) 8 MG tablet, Take 66m twice a day. Start the day after chemo for 2 days. Then take for nausea and vomiting twice a day as needed., Disp: 30 tablet, Rfl: 3 .  pantoprazole (PROTONIX) 40 MG tablet, TAKE 1 TABLET (40 MG TOTAL) BY MOUTH 2 (TWO) TIMES DAILY., Disp: , Rfl: 6 .  REVLIMID 20 MG CAPS, TAKE 1 CAPSULE BY MOUTH AT BEDTIME FOR 21 DAYS ON, THEN 7 DAYS OFF., Disp: 21 capsule, Rfl: 6 .  senna-docusate (SENOKOT-S) 8.6-50 MG per tablet, Take 4 tablets  by mouth 2 (two) times daily., Disp: , Rfl:  .  spironolactone (ALDACTONE) 50 MG tablet, Take 50 mg by mouth daily., Disp: , Rfl: 4 .  sulfamethoxazole-trimethoprim (BACTRIM DS) 800-160 MG tablet, Take 1 tablet by mouth 2 (two) times daily. For 3 days, Disp: 6 tablet, Rfl: 0 .  torsemide (DEMADEX) 20 MG tablet, Take 1 tablet (20 mg total) by mouth daily as needed., Disp: 30 tablet, Rfl: 6  Allergies:  Allergies  Allergen Reactions  . Codeine Palpitations    Past Medical History, Surgical history, Social history, and Family History were reviewed and updated.  Review of Systems: As above  Physical Exam:  height is _0  (1.626 m) and weight is 167 lb (75.751 kg). Her oral temperature is 97.8 F (36.6 C). Her blood pressure is 110/60 and her pulse is 90. Her respiration is 16.   Well-developed and  well-nourished African American female. Head and neck exam shows no ocular or oral lesions. There are no palpable cervical or supraclavicular lymph nodes. Lungs are clear. Cardiac exam regular rate and rhythm with no murmurs, rubs or bruits. Abdomen is soft. She is mildly obese. She really has no abdominal distention. She has no palpable liver or spleen tip. Back exam shows no tenderness over the spine, ribs or hips. Extremity shows no clubbing, cyanosis or edema. Skin exam shows no rashes, ecchymoses or petechia. Neurological exam is nonfocal.  Lab Results  Component Value Date   WBC 3.5* 02/18/2015   HGB 9.8* 02/18/2015   HCT 29.6* 02/18/2015   MCV 92 02/18/2015   PLT 177 02/18/2015     Chemistry      Component Value Date/Time   NA 137 02/18/2015 1202   NA 138 02/04/2015 0826   NA 139 12/25/2014 0816   K 3.9 02/18/2015 1202   K 3.9 02/04/2015 0826   K 4.0 12/25/2014 0816   CL 102 02/18/2015 1202   CL 100 02/04/2015 0826   CO2 28 02/18/2015 1202   CO2 28 02/04/2015 0826   CO2 29 12/25/2014 0816   BUN 19 02/18/2015 1202   BUN 19 02/04/2015 0826   BUN 17.8 12/25/2014 0816   CREATININE 1.1 02/18/2015 1202   CREATININE 1.07* 02/04/2015 0826   CREATININE 0.8 12/25/2014 0816      Component Value Date/Time   CALCIUM 9.4 02/18/2015 1202   CALCIUM 9.6 02/04/2015 0826   CALCIUM 9.5 12/25/2014 0816   ALKPHOS 46 02/18/2015 1202   ALKPHOS 47 12/25/2014 0816   ALKPHOS 47 06/03/2014 0122   AST 24 02/18/2015 1202   AST 20 12/25/2014 0816   AST 40* 06/03/2014 0122   ALT 19 02/18/2015 1202   ALT 20 12/25/2014 0816   ALT 43* 06/03/2014 0122   BILITOT 0.80 02/18/2015 1202   BILITOT 0.56 12/25/2014 0816   BILITOT 0.5 06/03/2014 0122         Impression and Plan: Barbara Cook is 59 year old African-American female with IgG kappa myeloma.   I am a little bit worried about the monoclonal spike being up a little bit. We will have to watch this closely.  She has been incredibly  reluctant to make a lot of changes with her therapy. If we had to make any changes at all, we will have to be very cautious about this.  We will see what the x-rays show.  We will have her come back in 2 weeks for Velcade.  I will see her back in one month.  I spent about 30  minutes with them today.    [name], MD 10/11/20161:09 PM

## 2015-02-20 ENCOUNTER — Encounter: Payer: Self-pay | Admitting: *Deleted

## 2015-02-24 ENCOUNTER — Encounter: Payer: Self-pay | Admitting: *Deleted

## 2015-02-24 LAB — URIC ACID: URIC ACID, SERUM: 5.1 mg/dL (ref 2.4–7.0)

## 2015-02-24 LAB — PROTEIN ELECTROPHORESIS, SERUM, WITH REFLEX
ALBUMIN ELP: 3.9 g/dL (ref 3.8–4.8)
Abnormal Protein Band1: 0.7 g/dL
Alpha-1-Globulin: 0.4 g/dL — ABNORMAL HIGH (ref 0.2–0.3)
Alpha-2-Globulin: 0.8 g/dL (ref 0.5–0.9)
BETA 2: 0.3 g/dL (ref 0.2–0.5)
BETA GLOBULIN: 0.4 g/dL (ref 0.4–0.6)
Gamma Globulin: 1.5 g/dL (ref 0.8–1.7)
TOTAL PROTEIN, SERUM ELECTROPHOR: 7.2 g/dL (ref 6.1–8.1)

## 2015-02-24 LAB — IGG, IGA, IGM
IGG (IMMUNOGLOBIN G), SERUM: 1800 mg/dL — AB (ref 690–1700)
IgA: 63 mg/dL — ABNORMAL LOW (ref 69–380)
IgM, Serum: 10 mg/dL — ABNORMAL LOW (ref 52–322)

## 2015-02-24 LAB — KAPPA/LAMBDA LIGHT CHAINS
KAPPA FREE LGHT CHN: 5.17 mg/dL — AB (ref 0.33–1.94)
KAPPA LAMBDA RATIO: 3.62 — AB (ref 0.26–1.65)
LAMBDA FREE LGHT CHN: 1.43 mg/dL (ref 0.57–2.63)

## 2015-02-24 LAB — IFE INTERPRETATION

## 2015-02-28 ENCOUNTER — Other Ambulatory Visit: Payer: Self-pay

## 2015-02-28 MED ORDER — LENALIDOMIDE 20 MG PO CAPS: 20.0000 mg | ORAL_CAPSULE | Freq: Every day | ORAL | Status: DC

## 2015-03-04 ENCOUNTER — Other Ambulatory Visit (HOSPITAL_BASED_OUTPATIENT_CLINIC_OR_DEPARTMENT_OTHER): Payer: Commercial Managed Care - HMO

## 2015-03-04 ENCOUNTER — Ambulatory Visit (HOSPITAL_BASED_OUTPATIENT_CLINIC_OR_DEPARTMENT_OTHER): Payer: Commercial Managed Care - HMO

## 2015-03-04 VITALS — BP 116/55 | HR 69 | Temp 97.6°F | Resp 18

## 2015-03-04 DIAGNOSIS — C9002 Multiple myeloma in relapse: Secondary | ICD-10-CM

## 2015-03-04 DIAGNOSIS — C9000 Multiple myeloma not having achieved remission: Secondary | ICD-10-CM

## 2015-03-04 DIAGNOSIS — C9001 Multiple myeloma in remission: Secondary | ICD-10-CM

## 2015-03-04 DIAGNOSIS — Z5112 Encounter for antineoplastic immunotherapy: Secondary | ICD-10-CM

## 2015-03-04 DIAGNOSIS — G8929 Other chronic pain: Secondary | ICD-10-CM

## 2015-03-04 DIAGNOSIS — M546 Pain in thoracic spine: Principal | ICD-10-CM

## 2015-03-04 LAB — CBC WITH DIFFERENTIAL (CANCER CENTER ONLY)
BASO#: 0 10*3/uL (ref 0.0–0.2)
BASO%: 0.4 % (ref 0.0–2.0)
EOS ABS: 0.2 10*3/uL (ref 0.0–0.5)
EOS%: 6.1 % (ref 0.0–7.0)
HCT: 29.3 % — ABNORMAL LOW (ref 34.8–46.6)
HGB: 9.6 g/dL — ABNORMAL LOW (ref 11.6–15.9)
LYMPH#: 0.6 10*3/uL — ABNORMAL LOW (ref 0.9–3.3)
LYMPH%: 26.1 % (ref 14.0–48.0)
MCH: 30.2 pg (ref 26.0–34.0)
MCHC: 32.8 g/dL (ref 32.0–36.0)
MCV: 92 fL (ref 81–101)
MONO#: 0.4 10*3/uL (ref 0.1–0.9)
MONO%: 16.3 % — AB (ref 0.0–13.0)
NEUT#: 1.3 10*3/uL — ABNORMAL LOW (ref 1.5–6.5)
NEUT%: 51.1 % (ref 39.6–80.0)
PLATELETS: 138 10*3/uL — AB (ref 145–400)
RBC: 3.18 10*6/uL — ABNORMAL LOW (ref 3.70–5.32)
RDW: 14.1 % (ref 11.1–15.7)
WBC: 2.5 10*3/uL — AB (ref 3.9–10.0)

## 2015-03-04 LAB — CMP (CANCER CENTER ONLY)
ALT(SGPT): 23 U/L (ref 10–47)
AST: 28 U/L (ref 11–38)
Albumin: 3.6 g/dL (ref 3.3–5.5)
Alkaline Phosphatase: 39 U/L (ref 26–84)
BUN: 14 mg/dL (ref 7–22)
CHLORIDE: 104 meq/L (ref 98–108)
CO2: 28 meq/L (ref 18–33)
CREATININE: 1 mg/dL (ref 0.6–1.2)
Calcium: 9.6 mg/dL (ref 8.0–10.3)
Glucose, Bld: 87 mg/dL (ref 73–118)
Potassium: 4.2 mEq/L (ref 3.3–4.7)
SODIUM: 141 meq/L (ref 128–145)
Total Bilirubin: 0.7 mg/dl (ref 0.20–1.60)
Total Protein: 7.4 g/dL (ref 6.4–8.1)

## 2015-03-04 MED ORDER — ONDANSETRON HCL 8 MG PO TABS
8.0000 mg | ORAL_TABLET | Freq: Once | ORAL | Status: AC
  Administered 2015-03-04: 8 mg via ORAL

## 2015-03-04 MED ORDER — BORTEZOMIB CHEMO SQ INJECTION 3.5 MG (2.5MG/ML)
1.1700 mg/m2 | Freq: Once | INTRAMUSCULAR | Status: AC
  Administered 2015-03-04: 2.25 mg via SUBCUTANEOUS
  Filled 2015-03-04: qty 2.25

## 2015-03-04 MED ORDER — ONDANSETRON HCL 8 MG PO TABS
ORAL_TABLET | ORAL | Status: AC
  Filled 2015-03-04: qty 1

## 2015-03-04 NOTE — Patient Instructions (Signed)
Bortezomib injection What is this medicine? BORTEZOMIB (bor TEZ oh mib) is a medicine that targets proteins in cancer cells and stops the cancer cells from growing. It is used to treat multiple myeloma and mantle-cell lymphoma. This medicine may be used for other purposes; ask your health care provider or pharmacist if you have questions. What should I tell my health care provider before I take this medicine? They need to know if you have any of these conditions: -diabetes -heart disease -irregular heartbeat -liver disease -on hemodialysis -low blood counts, like low white blood cells, platelets, or hemoglobin -peripheral neuropathy -taking medicine for blood pressure -an unusual or allergic reaction to bortezomib, mannitol, boron, other medicines, foods, dyes, or preservatives -pregnant or trying to get pregnant -breast-feeding How should I use this medicine? This medicine is for injection into a vein or for injection under the skin. It is given by a health care professional in a hospital or clinic setting. Talk to your pediatrician regarding the use of this medicine in children. Special care may be needed. Overdosage: If you think you have taken too much of this medicine contact a poison control center or emergency room at once. NOTE: This medicine is only for you. Do not share this medicine with others. What if I miss a dose? It is important not to miss your dose. Call your doctor or health care professional if you are unable to keep an appointment. What may interact with this medicine? This medicine may interact with the following medications: -ketoconazole -rifampin -ritonavir -St. John's Wort This list may not describe all possible interactions. Give your health care provider a list of all the medicines, herbs, non-prescription drugs, or dietary supplements you use. Also tell them if you smoke, drink alcohol, or use illegal drugs. Some items may interact with your medicine. What  should I watch for while using this medicine? Visit your doctor for checks on your progress. This drug may make you feel generally unwell. This is not uncommon, as chemotherapy can affect healthy cells as well as cancer cells. Report any side effects. Continue your course of treatment even though you feel ill unless your doctor tells you to stop. You may get drowsy or dizzy. Do not drive, use machinery, or do anything that needs mental alertness until you know how this medicine affects you. Do not stand or sit up quickly, especially if you are an older patient. This reduces the risk of dizzy or fainting spells. In some cases, you may be given additional medicines to help with side effects. Follow all directions for their use. Call your doctor or health care professional for advice if you get a fever, chills or sore throat, or other symptoms of a cold or flu. Do not treat yourself. This drug decreases your body's ability to fight infections. Try to avoid being around people who are sick. This medicine may increase your risk to bruise or bleed. Call your doctor or health care professional if you notice any unusual bleeding. You may need blood work done while you are taking this medicine. In some patients, this medicine may cause a serious brain infection that may cause death. If you have any problems seeing, thinking, speaking, walking, or standing, tell your doctor right away. If you cannot reach your doctor, urgently seek other source of medical care. Do not become pregnant while taking this medicine. Women should inform their doctor if they wish to become pregnant or think they might be pregnant. There is a potential for serious  side effects to an unborn child. Talk to your health care professional or pharmacist for more information. Do not breast-feed an infant while taking this medicine. Check with your doctor or health care professional if you get an attack of severe diarrhea, nausea and vomiting, or if  you sweat a lot. The loss of too much body fluid can make it dangerous for you to take this medicine. What side effects may I notice from receiving this medicine? Side effects that you should report to your doctor or health care professional as soon as possible: -allergic reactions like skin rash, itching or hives, swelling of the face, lips, or tongue -breathing problems -changes in hearing -changes in vision -fast, irregular heartbeat -feeling faint or lightheaded, falls -pain, tingling, numbness in the hands or feet -right upper belly pain -seizures -swelling of the ankles, feet, hands -unusual bleeding or bruising -unusually weak or tired -vomiting -yellowing of the eyes or skin Side effects that usually do not require medical attention (report to your doctor or health care professional if they continue or are bothersome): -changes in emotions or moods -constipation -diarrhea -loss of appetite -headache -irritation at site where injected -nausea This list may not describe all possible side effects. Call your doctor for medical advice about side effects. You may report side effects to FDA at 1-800-FDA-1088. Where should I keep my medicine? This drug is given in a hospital or clinic and will not be stored at home. NOTE: This sheet is a summary. It may not cover all possible information. If you have questions about this medicine, talk to your doctor, pharmacist, or health care provider.    2016, [employer] Standard. (2014-06-25 14:47:04)

## 2015-03-05 ENCOUNTER — Telehealth: Payer: Self-pay | Admitting: Hematology & Oncology

## 2015-03-05 NOTE — Telephone Encounter (Signed)
Faxed Medical Records via fax todayto: Adin Hector [facility] Ph: [place], Specialist Fx: [phone]   Case: [id number]   Medical Records requested from 2016 to present     [place] SCANNED

## 2015-03-07 ENCOUNTER — Other Ambulatory Visit: Payer: Commercial Managed Care - HMO

## 2015-03-07 ENCOUNTER — Ambulatory Visit (HOSPITAL_BASED_OUTPATIENT_CLINIC_OR_DEPARTMENT_OTHER): Payer: Commercial Managed Care - HMO

## 2015-03-07 VITALS — BP 126/57 | HR 87 | Temp 98.1°F | Resp 18

## 2015-03-07 DIAGNOSIS — C9000 Multiple myeloma not having achieved remission: Secondary | ICD-10-CM

## 2015-03-07 DIAGNOSIS — C9001 Multiple myeloma in remission: Secondary | ICD-10-CM

## 2015-03-07 MED ORDER — ZOLEDRONIC ACID 4 MG/100ML IV SOLN
4.0000 mg | Freq: Once | INTRAVENOUS | Status: AC
  Administered 2015-03-07: 4 mg via INTRAVENOUS
  Filled 2015-03-07: qty 100

## 2015-03-07 NOTE — Patient Instructions (Signed)

## 2015-03-17 ENCOUNTER — Ambulatory Visit (HOSPITAL_BASED_OUTPATIENT_CLINIC_OR_DEPARTMENT_OTHER): Payer: Commercial Managed Care - HMO

## 2015-03-17 ENCOUNTER — Ambulatory Visit: Payer: Commercial Managed Care - HMO | Admitting: Hematology & Oncology

## 2015-03-17 ENCOUNTER — Inpatient Hospital Stay: Payer: Commercial Managed Care - HMO

## 2015-03-17 ENCOUNTER — Other Ambulatory Visit: Payer: Self-pay

## 2015-03-17 ENCOUNTER — Other Ambulatory Visit: Payer: Commercial Managed Care - HMO

## 2015-03-17 ENCOUNTER — Ambulatory Visit (HOSPITAL_BASED_OUTPATIENT_CLINIC_OR_DEPARTMENT_OTHER): Payer: Commercial Managed Care - HMO | Admitting: Hematology & Oncology

## 2015-03-17 ENCOUNTER — Encounter: Payer: Self-pay | Admitting: Hematology & Oncology

## 2015-03-17 VITALS — BP 122/68 | HR 86 | Temp 97.6°F | Resp 16 | Ht 64.0 in | Wt 163.0 lb

## 2015-03-17 DIAGNOSIS — C9000 Multiple myeloma not having achieved remission: Secondary | ICD-10-CM

## 2015-03-17 DIAGNOSIS — D649 Anemia, unspecified: Secondary | ICD-10-CM | POA: Diagnosis not present

## 2015-03-17 DIAGNOSIS — Z5112 Encounter for antineoplastic immunotherapy: Secondary | ICD-10-CM | POA: Diagnosis not present

## 2015-03-17 DIAGNOSIS — C9002 Multiple myeloma in relapse: Secondary | ICD-10-CM

## 2015-03-17 DIAGNOSIS — C9001 Multiple myeloma in remission: Secondary | ICD-10-CM

## 2015-03-17 DIAGNOSIS — D509 Iron deficiency anemia, unspecified: Secondary | ICD-10-CM

## 2015-03-17 LAB — CBC WITH DIFFERENTIAL (CANCER CENTER ONLY)
BASO#: 0 10*3/uL (ref 0.0–0.2)
BASO%: 0.6 % (ref 0.0–2.0)
EOS%: 5.9 % (ref 0.0–7.0)
Eosinophils Absolute: 0.2 10*3/uL (ref 0.0–0.5)
HCT: 30 % — ABNORMAL LOW (ref 34.8–46.6)
HGB: 9.9 g/dL — ABNORMAL LOW (ref 11.6–15.9)
LYMPH#: 0.8 10*3/uL — ABNORMAL LOW (ref 0.9–3.3)
LYMPH%: 23.9 % (ref 14.0–48.0)
MCH: 29.8 pg (ref 26.0–34.0)
MCHC: 33 g/dL (ref 32.0–36.0)
MCV: 90 fL (ref 81–101)
MONO#: 0.3 10*3/uL (ref 0.1–0.9)
MONO%: 8.1 % (ref 0.0–13.0)
NEUT#: 2 10*3/uL (ref 1.5–6.5)
NEUT%: 61.5 % (ref 39.6–80.0)
PLATELETS: 200 10*3/uL (ref 145–400)
RBC: 3.32 10*6/uL — AB (ref 3.70–5.32)
RDW: 14.1 % (ref 11.1–15.7)
WBC: 3.2 10*3/uL — AB (ref 3.9–10.0)

## 2015-03-17 LAB — COMPREHENSIVE METABOLIC PANEL (CC13)
ALT: 17 U/L (ref 0–55)
AST: 23 U/L (ref 5–34)
Albumin: 3.7 g/dL (ref 3.5–5.0)
Alkaline Phosphatase: 52 U/L (ref 40–150)
Anion Gap: 9 mEq/L (ref 3–11)
BILIRUBIN TOTAL: 0.49 mg/dL (ref 0.20–1.20)
BUN: 19.8 mg/dL (ref 7.0–26.0)
CO2: 27 meq/L (ref 22–29)
Calcium: 10.1 mg/dL (ref 8.4–10.4)
Chloride: 102 mEq/L (ref 98–109)
Creatinine: 1.1 mg/dL (ref 0.6–1.1)
EGFR: 61 mL/min/{1.73_m2} — AB (ref >90)
GLUCOSE: 90 mg/dL (ref 70–140)
Potassium: 4.2 mEq/L (ref 3.5–5.1)
SODIUM: 138 meq/L (ref 136–145)
TOTAL PROTEIN: 8 g/dL (ref 6.4–8.3)

## 2015-03-17 MED ORDER — ONDANSETRON HCL 8 MG PO TABS
8.0000 mg | ORAL_TABLET | Freq: Once | ORAL | Status: AC
  Administered 2015-03-17: 8 mg via ORAL

## 2015-03-17 MED ORDER — ONDANSETRON HCL 8 MG PO TABS
ORAL_TABLET | ORAL | Status: AC
  Filled 2015-03-17: qty 1

## 2015-03-17 MED ORDER — BORTEZOMIB CHEMO SQ INJECTION 3.5 MG (2.5MG/ML)
1.1700 mg/m2 | Freq: Once | INTRAMUSCULAR | Status: AC
  Administered 2015-03-17: 2.25 mg via SUBCUTANEOUS
  Filled 2015-03-17: qty 2.25

## 2015-03-17 MED ORDER — ONDANSETRON HCL 8 MG PO TABS: 8.0000 mg | ORAL_TABLET | Freq: Two times a day (BID) | ORAL | Status: DC

## 2015-03-17 NOTE — Progress Notes (Signed)
Hematology and Oncology Follow Up Visit  Barbara Cook [id number] [birth year] 59 y.o. 03/17/2015   Principle Diagnosis:  IgG kappa myeloma   Current Therapy:     Velcade/Revlimid/ -Velcade every 3 week dosing  Zometa 4 mg IV every month     Interim History:  Ms.  Cook is back for a follow-up. She seems to be doing pretty well.   She sees her doing a little better. She is a little bit more active. She is now bothered by back pain.  She does have the chronic anemia. She does have a very low erythropoietin level of only 12.7. As such, we could give her ESA if necessary.  Her last monoclonal studies back in October showed a monoclonal spike of 0.7 g/dL. Her IgG level was 800 mg/dL. Her kappa light chain was 5.17 mg/dL. These are all holding pretty steady.  She now has a new hairstyle. She cut the other one. She is having that she has this new hairstyle.  She's having no problems with bowels or bladder. She's having no issues with her legs. She's having no weakness.  She's had no fever.  She's had no bleeding.   Currently, her performance status is ECOG 1  Medications:  Current outpatient prescriptions:  .  acetaminophen (TYLENOL) 500 MG tablet, Take 500 mg by mouth every 6 (six) hours as needed for mild pain or headache., Disp: , Rfl:  .  aspirin 325 MG tablet, Take 325 mg by mouth daily., Disp: , Rfl:  .  belladona alk-PHENObarbital (DONNATAL) 16.2 MG tablet, Take 1-2 pills, Every 8 hours AS NEEDED, for abdominal spasms., Disp: 60 tablet, Rfl: 1 .  Cholecalciferol (VITAMIN D) 1000 UNITS capsule, Take 2,000 Units by mouth 2 (two) times daily. 2 IN AM AND 2 IN PM, Disp: , Rfl:  .  famotidine (PEPCID) 40 MG tablet, Take 1 tablet (40 mg total) by mouth 2 (two) times daily., Disp: 30 tablet, Rfl: 6 .  fluocinonide cream (LIDEX) 0.05 %, APPLY TO THE AFFECTED AREAS ON BODY TWICE DAILY UPON IRRITATION ONLY, Disp: , Rfl: 2 .  KLOR-CON 8 MEQ tablet, 1 TABLET ONCE A DAY ORALLY 30  DAY(S), Disp: , Rfl: 6 .  Lenalidomide (REVLIMID) 20 MG CAPS, Take 20 mg by mouth daily. X 21 days then 7 days off. Auth # [id number], Disp: 21 capsule, Rfl: 0 .  Multiple Vitamin (MULTIVITAMIN) tablet, Take 1 tablet by mouth every evening. , Disp: , Rfl:  .  ondansetron (ZOFRAN) 8 MG tablet, Take 71m twice a day. Start the day after chemo for 2 days. Then take for nausea and vomiting twice a day as needed., Disp: 30 tablet, Rfl: 3 .  pantoprazole (PROTONIX) 40 MG tablet, TAKE 1 TABLET (40 MG TOTAL) BY MOUTH 2 (TWO) TIMES DAILY., Disp: , Rfl: 6 .  senna-docusate (SENOKOT-S) 8.6-50 MG per tablet, Take 4 tablets by mouth 2 (two) times daily., Disp: , Rfl:  .  spironolactone (ALDACTONE) 50 MG tablet, Take 50 mg by mouth daily., Disp: , Rfl: 4 .  sulfamethoxazole-trimethoprim (BACTRIM DS) 800-160 MG tablet, Take 1 tablet by mouth 2 (two) times daily. For 3 days, Disp: 6 tablet, Rfl: 0 .  torsemide (DEMADEX) 20 MG tablet, Take 1 tablet (20 mg total) by mouth daily as needed., Disp: 30 tablet, Rfl: 6  Allergies:  Allergies  Allergen Reactions  . Codeine Palpitations    Past Medical History, Surgical history, Social history, and Family History were reviewed and updated.  Review of  Systems: As above  Physical Exam:  height is _0  (1.626 m) and weight is 163 lb (73.936 kg). Her oral temperature is 97.6 F (36.4 C). Her blood pressure is 122/68 and her pulse is 86. Her respiration is 16.   Well-developed and well-nourished African American female. Head and neck exam shows no ocular or oral lesions. There are no palpable cervical or supraclavicular lymph nodes. Lungs are clear. Cardiac exam regular rate and rhythm with no murmurs, rubs or bruits. Abdomen is soft. She is mildly obese. She really has no abdominal distention. She has no palpable liver or spleen tip. Back exam shows no tenderness over the spine, ribs or hips. Extremity shows no clubbing, cyanosis or edema. Skin exam shows no rashes,  ecchymoses or petechia. Neurological exam is nonfocal.  Lab Results  Component Value Date   WBC 3.2* 03/17/2015   HGB 9.9* 03/17/2015   HCT 30.0* 03/17/2015   MCV 90 03/17/2015   PLT 200 03/17/2015     Chemistry      Component Value Date/Time   NA 141 03/04/2015 0820   NA 138 02/04/2015 0826   NA 139 12/25/2014 0816   K 4.2 03/04/2015 0820   K 3.9 02/04/2015 0826   K 4.0 12/25/2014 0816   CL 104 03/04/2015 0820   CL 100 02/04/2015 0826   CO2 28 03/04/2015 0820   CO2 28 02/04/2015 0826   CO2 29 12/25/2014 0816   BUN 14 03/04/2015 0820   BUN 19 02/04/2015 0826   BUN 17.8 12/25/2014 0816   CREATININE 1.0 03/04/2015 0820   CREATININE 1.07* 02/04/2015 0826   CREATININE 0.8 12/25/2014 0816      Component Value Date/Time   CALCIUM 9.6 03/04/2015 0820   CALCIUM 9.6 02/04/2015 0826   CALCIUM 9.5 12/25/2014 0816   ALKPHOS 39 03/04/2015 0820   ALKPHOS 47 12/25/2014 0816   ALKPHOS 47 06/03/2014 0122   AST 28 03/04/2015 0820   AST 20 12/25/2014 0816   AST 40* 06/03/2014 0122   ALT 23 03/04/2015 0820   ALT 20 12/25/2014 0816   ALT 43* 06/03/2014 0122   BILITOT 0.70 03/04/2015 0820   BILITOT 0.56 12/25/2014 0816   BILITOT 0.5 06/03/2014 0122         Impression and Plan: Barbara Cook is 59 year old African-American female with IgG kappa myeloma.   I am reassured a little bit. Her myeloma numbers are holding pretty stable. This is nice to see.   with the holidays coming up, we can change her appointments every 3 weeks for the Velcade. This will allow her to enjoy the holidays. If she has problems with her myeloma numbers worsening, then we will remove her back up to every 2 weeks.  I will see her back in 3 weeks.    [name], MD 11/7/201612:06 PM

## 2015-03-17 NOTE — Patient Instructions (Signed)
Bortezomib injection What is this medicine? BORTEZOMIB (bor TEZ oh mib) is a medicine that targets proteins in cancer cells and stops the cancer cells from growing. It is used to treat multiple myeloma and mantle-cell lymphoma. This medicine may be used for other purposes; ask your health care provider or pharmacist if you have questions. What should I tell my health care provider before I take this medicine? They need to know if you have any of these conditions: -diabetes -heart disease -irregular heartbeat -liver disease -on hemodialysis -low blood counts, like low white blood cells, platelets, or hemoglobin -peripheral neuropathy -taking medicine for blood pressure -an unusual or allergic reaction to bortezomib, mannitol, boron, other medicines, foods, dyes, or preservatives -pregnant or trying to get pregnant -breast-feeding How should I use this medicine? This medicine is for injection into a vein or for injection under the skin. It is given by a health care professional in a hospital or clinic setting. Talk to your pediatrician regarding the use of this medicine in children. Special care may be needed. Overdosage: If you think you have taken too much of this medicine contact a poison control center or emergency room at once. NOTE: This medicine is only for you. Do not share this medicine with others. What if I miss a dose? It is important not to miss your dose. Call your doctor or health care professional if you are unable to keep an appointment. What may interact with this medicine? This medicine may interact with the following medications: -ketoconazole -rifampin -ritonavir -St. John's Wort This list may not describe all possible interactions. Give your health care provider a list of all the medicines, herbs, non-prescription drugs, or dietary supplements you use. Also tell them if you smoke, drink alcohol, or use illegal drugs. Some items may interact with your medicine. What  should I watch for while using this medicine? Visit your doctor for checks on your progress. This drug may make you feel generally unwell. This is not uncommon, as chemotherapy can affect healthy cells as well as cancer cells. Report any side effects. Continue your course of treatment even though you feel ill unless your doctor tells you to stop. You may get drowsy or dizzy. Do not drive, use machinery, or do anything that needs mental alertness until you know how this medicine affects you. Do not stand or sit up quickly, especially if you are an older patient. This reduces the risk of dizzy or fainting spells. In some cases, you may be given additional medicines to help with side effects. Follow all directions for their use. Call your doctor or health care professional for advice if you get a fever, chills or sore throat, or other symptoms of a cold or flu. Do not treat yourself. This drug decreases your body's ability to fight infections. Try to avoid being around people who are sick. This medicine may increase your risk to bruise or bleed. Call your doctor or health care professional if you notice any unusual bleeding. You may need blood work done while you are taking this medicine. In some patients, this medicine may cause a serious brain infection that may cause death. If you have any problems seeing, thinking, speaking, walking, or standing, tell your doctor right away. If you cannot reach your doctor, urgently seek other source of medical care. Do not become pregnant while taking this medicine. Women should inform their doctor if they wish to become pregnant or think they might be pregnant. There is a potential for serious  side effects to an unborn child. Talk to your health care professional or pharmacist for more information. Do not breast-feed an infant while taking this medicine. Check with your doctor or health care professional if you get an attack of severe diarrhea, nausea and vomiting, or if  you sweat a lot. The loss of too much body fluid can make it dangerous for you to take this medicine. What side effects may I notice from receiving this medicine? Side effects that you should report to your doctor or health care professional as soon as possible: -allergic reactions like skin rash, itching or hives, swelling of the face, lips, or tongue -breathing problems -changes in hearing -changes in vision -fast, irregular heartbeat -feeling faint or lightheaded, falls -pain, tingling, numbness in the hands or feet -right upper belly pain -seizures -swelling of the ankles, feet, hands -unusual bleeding or bruising -unusually weak or tired -vomiting -yellowing of the eyes or skin Side effects that usually do not require medical attention (report to your doctor or health care professional if they continue or are bothersome): -changes in emotions or moods -constipation -diarrhea -loss of appetite -headache -irritation at site where injected -nausea This list may not describe all possible side effects. Call your doctor for medical advice about side effects. You may report side effects to FDA at 1-800-FDA-1088. Where should I keep my medicine? This drug is given in a hospital or clinic and will not be stored at home. NOTE: This sheet is a summary. It may not cover all possible information. If you have questions about this medicine, talk to your doctor, pharmacist, or health care provider.    2016, [employer] Standard. (2014-06-25 14:47:04)

## 2015-03-19 LAB — PROTEIN ELECTROPHORESIS, SERUM, WITH REFLEX
ALPHA-1-GLOBULIN: 0.4 g/dL — AB (ref 0.2–0.3)
Abnormal Protein Band1: 0.8 g/dL
Albumin ELP: 4.1 g/dL (ref 3.8–4.8)
Alpha-2-Globulin: 0.9 g/dL (ref 0.5–0.9)
BETA 2: 0.3 g/dL (ref 0.2–0.5)
BETA GLOBULIN: 0.4 g/dL (ref 0.4–0.6)
GAMMA GLOBULIN: 1.7 g/dL (ref 0.8–1.7)
TOTAL PROTEIN, SERUM ELECTROPHOR: 7.7 g/dL (ref 6.1–8.1)

## 2015-03-19 LAB — IGG, IGA, IGM
IGM, SERUM: 12 mg/dL — AB (ref 52–322)
IgA: 78 mg/dL (ref 69–380)
IgG (Immunoglobin G), Serum: 1950 mg/dL — ABNORMAL HIGH (ref 690–1700)

## 2015-03-19 LAB — IFE INTERPRETATION

## 2015-03-19 LAB — KAPPA/LAMBDA LIGHT CHAINS
KAPPA FREE LGHT CHN: 5.76 mg/dL — AB (ref 0.33–1.94)
Kappa:Lambda Ratio: 4 — ABNORMAL HIGH (ref 0.26–1.65)
LAMBDA FREE LGHT CHN: 1.44 mg/dL (ref 0.57–2.63)

## 2015-03-20 ENCOUNTER — Telehealth: Payer: Self-pay | Admitting: *Deleted

## 2015-03-20 NOTE — Telephone Encounter (Addendum)
Patient aware of results.   ----- Message from [name], MD sent at 03/19/2015  6:29 PM EST ----- Call - myeloma is still very low!! pete

## 2015-03-24 ENCOUNTER — Other Ambulatory Visit: Payer: Self-pay | Admitting: Hematology & Oncology

## 2015-03-27 ENCOUNTER — Other Ambulatory Visit: Payer: Self-pay | Admitting: Nurse Practitioner

## 2015-03-28 ENCOUNTER — Other Ambulatory Visit: Payer: Self-pay | Admitting: Hematology & Oncology

## 2015-03-28 DIAGNOSIS — C9002 Multiple myeloma in relapse: Secondary | ICD-10-CM

## 2015-04-07 ENCOUNTER — Encounter: Payer: Self-pay | Admitting: Hematology & Oncology

## 2015-04-07 ENCOUNTER — Other Ambulatory Visit: Payer: Self-pay | Admitting: *Deleted

## 2015-04-07 ENCOUNTER — Ambulatory Visit (HOSPITAL_BASED_OUTPATIENT_CLINIC_OR_DEPARTMENT_OTHER): Payer: Commercial Managed Care - HMO

## 2015-04-07 ENCOUNTER — Ambulatory Visit (HOSPITAL_BASED_OUTPATIENT_CLINIC_OR_DEPARTMENT_OTHER): Payer: Commercial Managed Care - HMO | Admitting: Hematology & Oncology

## 2015-04-07 VITALS — BP 119/65 | HR 90 | Temp 97.9°F | Resp 20 | Ht 64.0 in | Wt 159.0 lb

## 2015-04-07 DIAGNOSIS — D509 Iron deficiency anemia, unspecified: Secondary | ICD-10-CM

## 2015-04-07 DIAGNOSIS — C9002 Multiple myeloma in relapse: Secondary | ICD-10-CM

## 2015-04-07 DIAGNOSIS — Z5112 Encounter for antineoplastic immunotherapy: Secondary | ICD-10-CM | POA: Diagnosis not present

## 2015-04-07 DIAGNOSIS — R110 Nausea: Secondary | ICD-10-CM

## 2015-04-07 DIAGNOSIS — C9000 Multiple myeloma not having achieved remission: Secondary | ICD-10-CM | POA: Diagnosis not present

## 2015-04-07 LAB — CBC WITH DIFFERENTIAL (CANCER CENTER ONLY)
BASO#: 0 10*3/uL (ref 0.0–0.2)
BASO%: 0.9 % (ref 0.0–2.0)
EOS ABS: 0.1 10*3/uL (ref 0.0–0.5)
EOS%: 1.9 % (ref 0.0–7.0)
HEMATOCRIT: 28.2 % — AB (ref 34.8–46.6)
HEMOGLOBIN: 9.3 g/dL — AB (ref 11.6–15.9)
LYMPH#: 0.9 10*3/uL (ref 0.9–3.3)
LYMPH%: 27 % (ref 14.0–48.0)
MCH: 29.9 pg (ref 26.0–34.0)
MCHC: 33 g/dL (ref 32.0–36.0)
MCV: 91 fL (ref 81–101)
MONO#: 0.6 10*3/uL (ref 0.1–0.9)
MONO%: 17.3 % — AB (ref 0.0–13.0)
NEUT%: 52.9 % (ref 39.6–80.0)
NEUTROS ABS: 1.7 10*3/uL (ref 1.5–6.5)
Platelets: 260 10*3/uL (ref 145–400)
RBC: 3.11 10*6/uL — ABNORMAL LOW (ref 3.70–5.32)
RDW: 14.2 % (ref 11.1–15.7)
WBC: 3.2 10*3/uL — ABNORMAL LOW (ref 3.9–10.0)

## 2015-04-07 LAB — CMP (CANCER CENTER ONLY)
ALBUMIN: 3.7 g/dL (ref 3.3–5.5)
ALK PHOS: 46 U/L (ref 26–84)
ALT: 18 U/L (ref 10–47)
AST: 27 U/L (ref 11–38)
BUN: 24 mg/dL — AB (ref 7–22)
CALCIUM: 10.2 mg/dL (ref 8.0–10.3)
CO2: 31 mEq/L (ref 18–33)
Chloride: 96 mEq/L — ABNORMAL LOW (ref 98–108)
Creat: 1.4 mg/dl — ABNORMAL HIGH (ref 0.6–1.2)
Glucose, Bld: 101 mg/dL (ref 73–118)
POTASSIUM: 3.9 meq/L (ref 3.3–4.7)
Sodium: 141 mEq/L (ref 128–145)
TOTAL PROTEIN: 8.5 g/dL — AB (ref 6.4–8.1)
Total Bilirubin: 0.7 mg/dl (ref 0.20–1.60)

## 2015-04-07 LAB — MAGNESIUM (CC13): Magnesium: 1.8 mg/dl (ref 1.5–2.5)

## 2015-04-07 LAB — MAGNESIUM: MAGNESIUM: 1.8 mg/dL (ref 1.5–2.5)

## 2015-04-07 MED ORDER — BORTEZOMIB CHEMO SQ INJECTION 3.5 MG (2.5MG/ML)
1.1700 mg/m2 | Freq: Once | INTRAMUSCULAR | Status: AC
  Administered 2015-04-07: 2.25 mg via SUBCUTANEOUS
  Filled 2015-04-07: qty 2.25

## 2015-04-07 MED ORDER — MAGNESIUM ASPARTATE HCL 615 MG PO TBEC: 615.0000 mg | DELAYED_RELEASE_TABLET | Freq: Two times a day (BID) | ORAL | Status: DC

## 2015-04-07 NOTE — Patient Instructions (Signed)
Bortezomib injection What is this medicine? BORTEZOMIB (bor TEZ oh mib) is a medicine that targets proteins in cancer cells and stops the cancer cells from growing. It is used to treat multiple myeloma and mantle-cell lymphoma. This medicine may be used for other purposes; ask your health care provider or pharmacist if you have questions. What should I tell my health care provider before I take this medicine? They need to know if you have any of these conditions: -diabetes -heart disease -irregular heartbeat -liver disease -on hemodialysis -low blood counts, like low white blood cells, platelets, or hemoglobin -peripheral neuropathy -taking medicine for blood pressure -an unusual or allergic reaction to bortezomib, mannitol, boron, other medicines, foods, dyes, or preservatives -pregnant or trying to get pregnant -breast-feeding How should I use this medicine? This medicine is for injection into a vein or for injection under the skin. It is given by a health care professional in a hospital or clinic setting. Talk to your pediatrician regarding the use of this medicine in children. Special care may be needed. Overdosage: If you think you have taken too much of this medicine contact a poison control center or emergency room at once. NOTE: This medicine is only for you. Do not share this medicine with others. What if I miss a dose? It is important not to miss your dose. Call your doctor or health care professional if you are unable to keep an appointment. What may interact with this medicine? This medicine may interact with the following medications: -ketoconazole -rifampin -ritonavir -St. John's Wort This list may not describe all possible interactions. Give your health care provider a list of all the medicines, herbs, non-prescription drugs, or dietary supplements you use. Also tell them if you smoke, drink alcohol, or use illegal drugs. Some items may interact with your medicine. What  should I watch for while using this medicine? Visit your doctor for checks on your progress. This drug may make you feel generally unwell. This is not uncommon, as chemotherapy can affect healthy cells as well as cancer cells. Report any side effects. Continue your course of treatment even though you feel ill unless your doctor tells you to stop. You may get drowsy or dizzy. Do not drive, use machinery, or do anything that needs mental alertness until you know how this medicine affects you. Do not stand or sit up quickly, especially if you are an older patient. This reduces the risk of dizzy or fainting spells. In some cases, you may be given additional medicines to help with side effects. Follow all directions for their use. Call your doctor or health care professional for advice if you get a fever, chills or sore throat, or other symptoms of a cold or flu. Do not treat yourself. This drug decreases your body's ability to fight infections. Try to avoid being around people who are sick. This medicine may increase your risk to bruise or bleed. Call your doctor or health care professional if you notice any unusual bleeding. You may need blood work done while you are taking this medicine. In some patients, this medicine may cause a serious brain infection that may cause death. If you have any problems seeing, thinking, speaking, walking, or standing, tell your doctor right away. If you cannot reach your doctor, urgently seek other source of medical care. Do not become pregnant while taking this medicine. Women should inform their doctor if they wish to become pregnant or think they might be pregnant. There is a potential for serious  side effects to an unborn child. Talk to your health care professional or pharmacist for more information. Do not breast-feed an infant while taking this medicine. Check with your doctor or health care professional if you get an attack of severe diarrhea, nausea and vomiting, or if  you sweat a lot. The loss of too much body fluid can make it dangerous for you to take this medicine. What side effects may I notice from receiving this medicine? Side effects that you should report to your doctor or health care professional as soon as possible: -allergic reactions like skin rash, itching or hives, swelling of the face, lips, or tongue -breathing problems -changes in hearing -changes in vision -fast, irregular heartbeat -feeling faint or lightheaded, falls -pain, tingling, numbness in the hands or feet -right upper belly pain -seizures -swelling of the ankles, feet, hands -unusual bleeding or bruising -unusually weak or tired -vomiting -yellowing of the eyes or skin Side effects that usually do not require medical attention (report to your doctor or health care professional if they continue or are bothersome): -changes in emotions or moods -constipation -diarrhea -loss of appetite -headache -irritation at site where injected -nausea This list may not describe all possible side effects. Call your doctor for medical advice about side effects. You may report side effects to FDA at 1-800-FDA-1088. Where should I keep my medicine? This drug is given in a hospital or clinic and will not be stored at home. NOTE: This sheet is a summary. It may not cover all possible information. If you have questions about this medicine, talk to your doctor, pharmacist, or health care provider.    2016, [employer] Standard. (2014-06-25 14:47:04)

## 2015-04-07 NOTE — Progress Notes (Signed)
Hematology and Oncology Follow Up Visit  Barbara Cook [id number] 03-Jun-1955 59 y.o. 04/07/2015   Principle Diagnosis:  IgG kappa myeloma   Current Therapy:     Velcade/Revlimid/ -Velcade every 3 week dosing  Zometa 4 mg IV every month     Interim History:  [name] is back for a follow-up. She is still complaining of leg cramps. I'm not sure exactly what to be causing this. We are checking a magnesium level on her today.  She drinks a lot of water from what her husband says.  Her last myeloma numbers looked maybe a little bit on the higher side. Her M spike was 0.8 g/dL. Her IgG level was 1950 mg/dL. Her Kappa Lightchain was 5.76 mg/dL.  She is not having as much the way of back discomfort. Patient is having some abdominal bloating. She does have IBS. I told her that she probably needs to see her gastroenterologist for this.. She does have some degree of fatigue.  She's had no bleeding.  She's had no fever.  Currently, her performance status is ECOG 1  Medications:  Current outpatient prescriptions:  .  acetaminophen (TYLENOL) 500 MG tablet, Take 500 mg by mouth every 6 (six) hours as needed for mild pain or headache., Disp: , Rfl:  .  aspirin 325 MG tablet, Take 325 mg by mouth daily., Disp: , Rfl:  .  belladona alk-PHENObarbital (DONNATAL) 16.2 MG tablet, Take 1-2 pills, Every 8 hours AS NEEDED, for abdominal spasms., Disp: 60 tablet, Rfl: 1 .  Cholecalciferol (VITAMIN D) 1000 UNITS capsule, Take 2,000 Units by mouth 2 (two) times daily. 2 IN AM AND 2 IN PM, Disp: , Rfl:  .  famotidine (PEPCID) 40 MG tablet, Take 1 tablet (40 mg total) by mouth 2 (two) times daily., Disp: 30 tablet, Rfl: 6 .  fluocinonide cream (LIDEX) 0.05 %, APPLY TO THE AFFECTED AREAS ON BODY TWICE DAILY UPON IRRITATION ONLY, Disp: , Rfl: 2 .  KLOR-CON 8 MEQ tablet, 1 TABLET ONCE A DAY ORALLY 30 DAY(S), Disp: , Rfl: 6 .  Lenalidomide (REVLIMID) 20 MG CAPS, Take 1 capsule by mouth daily for 21 days  then off 7 days. RPRX#[id number], Disp: 21 capsule, Rfl: 0 .  Multiple Vitamin (MULTIVITAMIN) tablet, Take 1 tablet by mouth every evening. , Disp: , Rfl:  .  ondansetron (ZOFRAN) 8 MG tablet, Take 1 tablet (8 mg total) by mouth 2 (two) times daily. For nausea & vomiting. Take 1 tablet 1 hour prior to chemotherapy., Disp: 20 tablet, Rfl: 0 .  pantoprazole (PROTONIX) 40 MG tablet, TAKE 1 TABLET (40 MG TOTAL) BY MOUTH 2 (TWO) TIMES DAILY., Disp: , Rfl: 6 .  REVLIMID 20 MG CAPS, TAKE 1 CAPSULE (20MG) BY MOUTH DAILY FOR 21 DAYS THEN 7 DAYS OFF. MAYCAUSE DROWSINESS OR DIZZINESS. DO NOT OPEN CAPSULES. REFILL: [id number], Disp: 21 capsule, Rfl: 4 .  senna-docusate (SENOKOT-S) 8.6-50 MG per tablet, Take 4 tablets by mouth 2 (two) times daily., Disp: , Rfl:  .  spironolactone (ALDACTONE) 50 MG tablet, Take 50 mg by mouth daily., Disp: , Rfl: 4 .  sulfamethoxazole-trimethoprim (BACTRIM DS) 800-160 MG tablet, Take 1 tablet by mouth 2 (two) times daily. For 3 days, Disp: 6 tablet, Rfl: 0 .  torsemide (DEMADEX) 20 MG tablet, Take 1 tablet (20 mg total) by mouth daily as needed., Disp: 30 tablet, Rfl: 6 .  magnesium aspartate (MAGINEX) 615 MG tablet, Take 1 tablet (615 mg total) by mouth 2 (two) times daily.,  Disp: 60 tablet, Rfl: 6 No current facility-administered medications for this visit.  Facility-Administered Medications Ordered in Other Visits:  .  bortezomib SQ (VELCADE) chemo injection 2.25 mg, 1.17 mg/m2 (Order-Specific), Subcutaneous, Once, [name], MD  Allergies:  Allergies  Allergen Reactions  . Codeine Palpitations    Past Medical History, Surgical history, Social history, and Family History were reviewed and updated.  Review of Systems: As above  Physical Exam:  height is _0  (1.626 m) and weight is 159 lb (72.122 kg). Her oral temperature is 97.9 F (36.6 C). Her blood pressure is 119/65 and her pulse is 90. Her respiration is 20.   Well-developed and well-nourished African  American female. Head and neck exam shows no ocular or oral lesions. There are no palpable cervical or supraclavicular lymph nodes. Lungs are clear. Cardiac exam regular rate and rhythm with no murmurs, rubs or bruits. Abdomen is soft. She is mildly obese. She really has no abdominal distention. She has no palpable liver or spleen tip. Back exam shows no tenderness over the spine, ribs or hips. Extremity shows no clubbing, cyanosis or edema. Skin exam shows no rashes, ecchymoses or petechia. Neurological exam is nonfocal.  Lab Results  Component Value Date   WBC 3.2* 04/07/2015   HGB 9.3* 04/07/2015   HCT 28.2* 04/07/2015   MCV 91 04/07/2015   PLT 260 04/07/2015     Chemistry      Component Value Date/Time   NA 141 04/07/2015 1323   NA 138 03/17/2015 1113   NA 138 02/04/2015 0826   K 3.9 04/07/2015 1323   K 4.2 03/17/2015 1113   K 3.9 02/04/2015 0826   CL 96* 04/07/2015 1323   CL 100 02/04/2015 0826   CO2 31 04/07/2015 1323   CO2 27 03/17/2015 1113   CO2 28 02/04/2015 0826   BUN 24* 04/07/2015 1323   BUN 19.8 03/17/2015 1113   BUN 19 02/04/2015 0826   CREATININE 1.4* 04/07/2015 1323   CREATININE 1.1 03/17/2015 1113   CREATININE 1.07* 02/04/2015 0826      Component Value Date/Time   CALCIUM 10.2 04/07/2015 1323   CALCIUM 10.1 03/17/2015 1113   CALCIUM 9.6 02/04/2015 0826   ALKPHOS 46 04/07/2015 1323   ALKPHOS 52 03/17/2015 1113   ALKPHOS 47 06/03/2014 0122   AST 27 04/07/2015 1323   AST 23 03/17/2015 1113   AST 40* 06/03/2014 0122   ALT 18 04/07/2015 1323   ALT 17 03/17/2015 1113   ALT 43* 06/03/2014 0122   BILITOT 0.70 04/07/2015 1323   BILITOT 0.49 03/17/2015 1113   BILITOT 0.5 06/03/2014 0122         Impression and Plan: Barbara Cook is 59 year old African-American female with IgG kappa myeloma.   It is hard to say what is going on with the myeloma. I would have say that her numbers look like a myeloma is pretty stable.  We will see what her myeloma levels  look like today.  With the Christmas holiday coming up, we will probably do her next treatment after Christmas.  Hopefully, the Maginex will help with the leg cramps. I told her to make sure she drinks a lot of water.  [name], MD 11/28/20162:32 PM

## 2015-04-08 ENCOUNTER — Other Ambulatory Visit: Payer: Self-pay | Admitting: *Deleted

## 2015-04-08 DIAGNOSIS — C9000 Multiple myeloma not having achieved remission: Secondary | ICD-10-CM

## 2015-04-08 LAB — IRON AND TIBC CHCC
%SAT: 23 % (ref 21–57)
IRON: 69 ug/dL (ref 41–142)
TIBC: 299 ug/dL (ref 236–444)
UIBC: 230 ug/dL (ref 120–384)

## 2015-04-08 LAB — FERRITIN CHCC: Ferritin: 379 ng/ml — ABNORMAL HIGH (ref 9–269)

## 2015-04-08 MED ORDER — MAGNESIUM OXIDE 400 (241.3 MG) MG PO TABS: 400.0000 mg | ORAL_TABLET | Freq: Two times a day (BID) | ORAL | Status: DC

## 2015-04-09 ENCOUNTER — Encounter: Payer: Self-pay | Admitting: Gastroenterology

## 2015-04-09 LAB — PROTEIN ELECTROPHORESIS, SERUM, WITH REFLEX
ALBUMIN ELP: 4 g/dL (ref 3.8–4.8)
ALPHA-1-GLOBULIN: 0.4 g/dL — AB (ref 0.2–0.3)
ALPHA-2-GLOBULIN: 0.8 g/dL (ref 0.5–0.9)
Abnormal Protein Band1: 1 g/dL
BETA 2: 0.3 g/dL (ref 0.2–0.5)
Beta Globulin: 0.4 g/dL (ref 0.4–0.6)
GAMMA GLOBULIN: 1.7 g/dL (ref 0.8–1.7)
Total Protein, Serum Electrophoresis: 7.6 g/dL (ref 6.1–8.1)

## 2015-04-09 LAB — IFE INTERPRETATION

## 2015-04-09 LAB — IGG, IGA, IGM
IGM, SERUM: 13 mg/dL — AB (ref 52–322)
IgA: 103 mg/dL (ref 69–380)
IgG (Immunoglobin G), Serum: 2090 mg/dL — ABNORMAL HIGH (ref 690–1700)

## 2015-04-09 LAB — KAPPA/LAMBDA LIGHT CHAINS
Kappa free light chain: 5.15 mg/dL — ABNORMAL HIGH (ref 0.33–1.94)
Kappa:Lambda Ratio: 4.09 — ABNORMAL HIGH (ref 0.26–1.65)
Lambda Free Lght Chn: 1.26 mg/dL (ref 0.57–2.63)

## 2015-04-09 LAB — VITAMIN D 25 HYDROXY (VIT D DEFICIENCY, FRACTURES): Vit D, 25-Hydroxy: 38 ng/mL (ref 30–100)

## 2015-04-10 ENCOUNTER — Telehealth: Payer: Self-pay

## 2015-04-10 ENCOUNTER — Other Ambulatory Visit: Payer: Self-pay | Admitting: *Deleted

## 2015-04-10 DIAGNOSIS — C9000 Multiple myeloma not having achieved remission: Secondary | ICD-10-CM

## 2015-04-10 MED ORDER — ERGOCALCIFEROL 1.25 MG (50000 UT) PO CAPS: 50000.0000 [IU] | ORAL_CAPSULE | ORAL | Status: DC

## 2015-04-10 NOTE — Telephone Encounter (Addendum)
-----   Message from [name], MD sent at 04/09/2015  3:55 PM EST -----   Patient call regarding her vitamin D levels being low.  Patient was told to take 50000 units q week. Per Dr. Marin Olp order. Patient reported back the message and verbalized understanding.

## 2015-04-17 ENCOUNTER — Other Ambulatory Visit: Payer: Self-pay | Admitting: Hematology & Oncology

## 2015-04-21 ENCOUNTER — Other Ambulatory Visit: Payer: Self-pay | Admitting: *Deleted

## 2015-04-21 DIAGNOSIS — C9000 Multiple myeloma not having achieved remission: Secondary | ICD-10-CM

## 2015-04-21 MED ORDER — LENALIDOMIDE 20 MG PO CAPS
ORAL_CAPSULE | ORAL | Status: DC
Start: 2015-04-21 — End: 2015-05-16

## 2015-04-30 ENCOUNTER — Telehealth: Payer: Self-pay | Admitting: Hematology & Oncology

## 2015-04-30 NOTE — Telephone Encounter (Signed)
Submit Prior Authorization   Your prior authorization for chemotherapy has been approved. Your case reference number is [id number].        Provider Name:  Dr. Burney Gauze  Contact:  Baxter Flattery   Provider Address:  [address] [place], [place] [id number]  Phone Number:  [phone]     Fax Number:  [phone]     Patient Name:  Barbara Cook  Patient Id:  [id number]   Insurance Carrier:  [employer]       Primary Diagnosis Code:  C90.02  Description:  Multiple myeloma in relapse   Secondary Diagnosis Code:   Description:    Start Date:  05/21/2015  Request Type:  New Authorization Request   [facility]S code(s)  B8478  Description:  Bortezomib Gwendel Hanson)   Authorization Number:  [id number]     Review Date:  04/30/2015 8:36:38 AM     Expiration Date:  05/20/2016     Status:  Your case has been Approved.

## 2015-05-07 ENCOUNTER — Ambulatory Visit (HOSPITAL_BASED_OUTPATIENT_CLINIC_OR_DEPARTMENT_OTHER): Payer: Commercial Managed Care - HMO | Admitting: Hematology & Oncology

## 2015-05-07 ENCOUNTER — Encounter: Payer: Self-pay | Admitting: Hematology & Oncology

## 2015-05-07 ENCOUNTER — Ambulatory Visit (HOSPITAL_BASED_OUTPATIENT_CLINIC_OR_DEPARTMENT_OTHER): Payer: Commercial Managed Care - HMO

## 2015-05-07 VITALS — BP 122/65 | HR 87 | Temp 98.1°F | Resp 18 | Ht 64.0 in | Wt 158.0 lb

## 2015-05-07 DIAGNOSIS — C9000 Multiple myeloma not having achieved remission: Secondary | ICD-10-CM

## 2015-05-07 DIAGNOSIS — Z5112 Encounter for antineoplastic immunotherapy: Secondary | ICD-10-CM | POA: Diagnosis not present

## 2015-05-07 DIAGNOSIS — R110 Nausea: Secondary | ICD-10-CM

## 2015-05-07 DIAGNOSIS — C9002 Multiple myeloma in relapse: Secondary | ICD-10-CM

## 2015-05-07 LAB — CBC WITH DIFFERENTIAL (CANCER CENTER ONLY)
BASO#: 0 10*3/uL (ref 0.0–0.2)
BASO%: 0.8 % (ref 0.0–2.0)
EOS%: 3 % (ref 0.0–7.0)
Eosinophils Absolute: 0.1 10*3/uL (ref 0.0–0.5)
HCT: 28.1 % — ABNORMAL LOW (ref 34.8–46.6)
HGB: 9.2 g/dL — ABNORMAL LOW (ref 11.6–15.9)
LYMPH#: 0.8 10*3/uL — ABNORMAL LOW (ref 0.9–3.3)
LYMPH%: 28.7 % (ref 14.0–48.0)
MCH: 30 pg (ref 26.0–34.0)
MCHC: 32.7 g/dL (ref 32.0–36.0)
MCV: 92 fL (ref 81–101)
MONO#: 0.5 10*3/uL (ref 0.1–0.9)
MONO%: 19.6 % — ABNORMAL HIGH (ref 0.0–13.0)
NEUT#: 1.3 10*3/uL — ABNORMAL LOW (ref 1.5–6.5)
NEUT%: 47.9 % (ref 39.6–80.0)
Platelets: 202 10*3/uL (ref 145–400)
RBC: 3.07 10*6/uL — ABNORMAL LOW (ref 3.70–5.32)
RDW: 14.1 % (ref 11.1–15.7)
WBC: 2.7 10*3/uL — ABNORMAL LOW (ref 3.9–10.0)

## 2015-05-07 LAB — CMP (CANCER CENTER ONLY)
ALBUMIN: 3.7 g/dL (ref 3.3–5.5)
ALT(SGPT): 20 U/L (ref 10–47)
AST: 27 U/L (ref 11–38)
Alkaline Phosphatase: 45 U/L (ref 26–84)
BUN, Bld: 22 mg/dL (ref 7–22)
CALCIUM: 8.8 mg/dL (ref 8.0–10.3)
CHLORIDE: 96 meq/L — AB (ref 98–108)
CO2: 30 meq/L (ref 18–33)
Creat: 1 mg/dl (ref 0.6–1.2)
Glucose, Bld: 84 mg/dL (ref 73–118)
POTASSIUM: 3.5 meq/L (ref 3.3–4.7)
Sodium: 138 mEq/L (ref 128–145)
TOTAL PROTEIN: 8.3 g/dL — AB (ref 6.4–8.1)
Total Bilirubin: 0.8 mg/dl (ref 0.20–1.60)

## 2015-05-07 MED ORDER — BORTEZOMIB CHEMO SQ INJECTION 3.5 MG (2.5MG/ML)
1.1700 mg/m2 | Freq: Once | INTRAMUSCULAR | Status: AC
  Administered 2015-05-07: 2.25 mg via SUBCUTANEOUS
  Filled 2015-05-07: qty 2.25

## 2015-05-07 MED ORDER — ONDANSETRON HCL 8 MG PO TABS
ORAL_TABLET | ORAL | Status: AC
  Filled 2015-05-07: qty 1

## 2015-05-07 MED ORDER — ONDANSETRON HCL 8 MG PO TABS: 8.0000 mg | ORAL_TABLET | Freq: Once | ORAL | Status: DC

## 2015-05-07 NOTE — Patient Instructions (Signed)
Bortezomib injection What is this medicine? BORTEZOMIB (bor TEZ oh mib) is a medicine that targets proteins in cancer cells and stops the cancer cells from growing. It is used to treat multiple myeloma and mantle-cell lymphoma. This medicine may be used for other purposes; ask your health care provider or pharmacist if you have questions. What should I tell my health care provider before I take this medicine? They need to know if you have any of these conditions: -diabetes -heart disease -irregular heartbeat -liver disease -on hemodialysis -low blood counts, like low white blood cells, platelets, or hemoglobin -peripheral neuropathy -taking medicine for blood pressure -an unusual or allergic reaction to bortezomib, mannitol, boron, other medicines, foods, dyes, or preservatives -pregnant or trying to get pregnant -breast-feeding How should I use this medicine? This medicine is for injection into a vein or for injection under the skin. It is given by a health care professional in a hospital or clinic setting. Talk to your pediatrician regarding the use of this medicine in children. Special care may be needed. Overdosage: If you think you have taken too much of this medicine contact a poison control center or emergency room at once. NOTE: This medicine is only for you. Do not share this medicine with others. What if I miss a dose? It is important not to miss your dose. Call your doctor or health care professional if you are unable to keep an appointment. What may interact with this medicine? This medicine may interact with the following medications: -ketoconazole -rifampin -ritonavir -St. John's Wort This list may not describe all possible interactions. Give your health care provider a list of all the medicines, herbs, non-prescription drugs, or dietary supplements you use. Also tell them if you smoke, drink alcohol, or use illegal drugs. Some items may interact with your medicine. What  should I watch for while using this medicine? Visit your doctor for checks on your progress. This drug may make you feel generally unwell. This is not uncommon, as chemotherapy can affect healthy cells as well as cancer cells. Report any side effects. Continue your course of treatment even though you feel ill unless your doctor tells you to stop. You may get drowsy or dizzy. Do not drive, use machinery, or do anything that needs mental alertness until you know how this medicine affects you. Do not stand or sit up quickly, especially if you are an older patient. This reduces the risk of dizzy or fainting spells. In some cases, you may be given additional medicines to help with side effects. Follow all directions for their use. Call your doctor or health care professional for advice if you get a fever, chills or sore throat, or other symptoms of a cold or flu. Do not treat yourself. This drug decreases your body's ability to fight infections. Try to avoid being around people who are sick. This medicine may increase your risk to bruise or bleed. Call your doctor or health care professional if you notice any unusual bleeding. You may need blood work done while you are taking this medicine. In some patients, this medicine may cause a serious brain infection that may cause death. If you have any problems seeing, thinking, speaking, walking, or standing, tell your doctor right away. If you cannot reach your doctor, urgently seek other source of medical care. Do not become pregnant while taking this medicine. Women should inform their doctor if they wish to become pregnant or think they might be pregnant. There is a potential for serious  side effects to an unborn child. Talk to your health care professional or pharmacist for more information. Do not breast-feed an infant while taking this medicine. Check with your doctor or health care professional if you get an attack of severe diarrhea, nausea and vomiting, or if  you sweat a lot. The loss of too much body fluid can make it dangerous for you to take this medicine. What side effects may I notice from receiving this medicine? Side effects that you should report to your doctor or health care professional as soon as possible: -allergic reactions like skin rash, itching or hives, swelling of the face, lips, or tongue -breathing problems -changes in hearing -changes in vision -fast, irregular heartbeat -feeling faint or lightheaded, falls -pain, tingling, numbness in the hands or feet -right upper belly pain -seizures -swelling of the ankles, feet, hands -unusual bleeding or bruising -unusually weak or tired -vomiting -yellowing of the eyes or skin Side effects that usually do not require medical attention (report to your doctor or health care professional if they continue or are bothersome): -changes in emotions or moods -constipation -diarrhea -loss of appetite -headache -irritation at site where injected -nausea This list may not describe all possible side effects. Call your doctor for medical advice about side effects. You may report side effects to FDA at 1-800-FDA-1088. Where should I keep my medicine? This drug is given in a hospital or clinic and will not be stored at home. NOTE: This sheet is a summary. It may not cover all possible information. If you have questions about this medicine, talk to your doctor, pharmacist, or health care provider.    2016, [employer] Standard. (2014-06-25 14:47:04)

## 2015-05-07 NOTE — Progress Notes (Signed)
Hematology and Oncology Follow Up Visit  Barbara Cook [id number] January 10, 1956 59 y.o. 05/07/2015   Principle Diagnosis:  IgG kappa myeloma   Current Therapy:     Velcade/Revlimid/ -Velcade every 3 week dosing  Zometa 4 mg IV every month     Interim History:  Ms.  Cook is back for a follow-up. She looks pretty good. She had a good Christmas.  She's had no specific complaints. She still has occasional leg cramps.  She's had no cough. There's been no change in bowel or bladder habits. She's had no nausea or vomiting. She does have irritable bowel syndrome that the Velcade occasionally disrupts.  She's not noted any leg swelling. She's had no rashes.  Her myeloma studies back in November showed a M spike of 1.0 g/dL. Her IgG level was 2090 mg/dL. Her kappa light chain was 5.5 mg/dL.  Currently, her performance status is ECOG 1  Medications:  Current outpatient prescriptions:  .  acetaminophen (TYLENOL) 500 MG tablet, Take 500 mg by mouth every 6 (six) hours as needed for mild pain or headache., Disp: , Rfl:  .  aspirin 325 MG tablet, Take 325 mg by mouth daily., Disp: , Rfl:  .  belladona alk-PHENObarbital (DONNATAL) 16.2 MG tablet, Take 1-2 pills, Every 8 hours AS NEEDED, for abdominal spasms., Disp: 60 tablet, Rfl: 1 .  Cholecalciferol (VITAMIN D) 1000 UNITS capsule, Take 2,000 Units by mouth 2 (two) times daily. 2 IN AM AND 2 IN PM, Disp: , Rfl:  .  ergocalciferol (VITAMIN D2) 50000 UNITS capsule, Take 1 capsule (50,000 Units total) by mouth once a week., Disp: 12 capsule, Rfl: 3 .  famotidine (PEPCID) 40 MG tablet, Take 1 tablet (40 mg total) by mouth 2 (two) times daily., Disp: 30 tablet, Rfl: 6 .  fluocinonide cream (LIDEX) 0.05 %, APPLY TO THE AFFECTED AREAS ON BODY TWICE DAILY UPON IRRITATION ONLY, Disp: , Rfl: 2 .  KLOR-CON 8 MEQ tablet, 1 TABLET ONCE A DAY ORALLY 30 DAY(S), Disp: , Rfl: 6 .  Lenalidomide (REVLIMID) 20 MG CAPS, Take one capsule daily for 21 days then  off for 7 days. Repeat every 28 days Auth#[id number], Disp: 21 capsule, Rfl: 0 .  magnesium aspartate (MAGINEX) 615 MG tablet, Take 1 tablet (615 mg total) by mouth 2 (two) times daily., Disp: 60 tablet, Rfl: 6 .  magnesium oxide (MAG-OX) 400 (241.3 MG) MG tablet, Take 1 tablet (400 mg total) by mouth 2 (two) times daily., Disp: 60 tablet, Rfl: 3 .  Multiple Vitamin (MULTIVITAMIN) tablet, Take 1 tablet by mouth every evening. , Disp: , Rfl:  .  ondansetron (ZOFRAN) 8 MG tablet, Take 1 tablet (8 mg total) by mouth 2 (two) times daily. For nausea & vomiting. Take 1 tablet 1 hour prior to chemotherapy., Disp: 20 tablet, Rfl: 0 .  pantoprazole (PROTONIX) 40 MG tablet, TAKE 1 TABLET (40 MG TOTAL) BY MOUTH 2 (TWO) TIMES DAILY., Disp: , Rfl: 6 .  senna-docusate (SENOKOT-S) 8.6-50 MG per tablet, Take 4 tablets by mouth 2 (two) times daily., Disp: , Rfl:  .  spironolactone (ALDACTONE) 50 MG tablet, Take 50 mg by mouth daily., Disp: , Rfl: 4 .  sulfamethoxazole-trimethoprim (BACTRIM DS) 800-160 MG tablet, Take 1 tablet by mouth 2 (two) times daily. For 3 days, Disp: 6 tablet, Rfl: 0 .  torsemide (DEMADEX) 20 MG tablet, Take 1 tablet (20 mg total) by mouth daily as needed., Disp: 30 tablet, Rfl: 6 No current facility-administered medications for this visit.  Facility-Administered Medications Ordered in Other Visits:  .  ondansetron (ZOFRAN) tablet 8 mg, 8 mg, Oral, Once, [name], MD, 8 mg at 05/07/15 1444  Allergies:  Allergies  Allergen Reactions  . Codeine Palpitations    Past Medical History, Surgical history, Social history, and Family History were reviewed and updated.  Review of Systems: As above  Physical Exam:  height is 5' 4"  (1.626 m) and weight is 158 lb (71.668 kg). Her oral temperature is 98.1 F (36.7 C). Her blood pressure is 122/65 and her pulse is 87. Her respiration is 18.   Well-developed and well-nourished African American female. Head and neck exam shows no ocular or oral  lesions. There are no palpable cervical or supraclavicular lymph nodes. Lungs are clear. Cardiac exam regular rate and rhythm with no murmurs, rubs or bruits. Abdomen is soft. She is mildly obese. She really has no abdominal distention. She has no palpable liver or spleen tip. Back exam shows no tenderness over the spine, ribs or hips. Extremity shows no clubbing, cyanosis or edema. Skin exam shows no rashes, ecchymoses or petechia. Neurological exam is nonfocal.  Lab Results  Component Value Date   WBC 2.7* 05/07/2015   HGB 9.2* 05/07/2015   HCT 28.1* 05/07/2015   MCV 92 05/07/2015   PLT 202 05/07/2015     Chemistry      Component Value Date/Time   NA 138 05/07/2015 1337   NA 138 03/17/2015 1113   NA 138 02/04/2015 0826   K 3.5 05/07/2015 1337   K 4.2 03/17/2015 1113   K 3.9 02/04/2015 0826   CL 96* 05/07/2015 1337   CL 100 02/04/2015 0826   CO2 30 05/07/2015 1337   CO2 27 03/17/2015 1113   CO2 28 02/04/2015 0826   BUN 22 05/07/2015 1337   BUN 19.8 03/17/2015 1113   BUN 19 02/04/2015 0826   CREATININE 1.0 05/07/2015 1337   CREATININE 1.1 03/17/2015 1113   CREATININE 1.07* 02/04/2015 0826      Component Value Date/Time   CALCIUM 8.8 05/07/2015 1337   CALCIUM 10.1 03/17/2015 1113   CALCIUM 9.6 02/04/2015 0826   ALKPHOS 45 05/07/2015 1337   ALKPHOS 52 03/17/2015 1113   ALKPHOS 47 06/03/2014 0122   AST 27 05/07/2015 1337   AST 23 03/17/2015 1113   AST 40* 06/03/2014 0122   ALT 20 05/07/2015 1337   ALT 17 03/17/2015 1113   ALT 43* 06/03/2014 0122   BILITOT 0.80 05/07/2015 1337   BILITOT 0.49 03/17/2015 1113   BILITOT 0.5 06/03/2014 0122         Impression and Plan: Barbara Cook is 59 year old African-American female with IgG kappa myeloma.   It is hard to say what is going on with the myeloma. I would have say that her numbers look like a myeloma is pretty stable.  We will see what her myeloma levels look like today.  We will go ahead and do treatment today.      [name], MD 12/28/20164:59 PM

## 2015-05-09 LAB — SPEP & IFE WITH QIG
ALPHA-1-GLOBULIN: 0.3 g/dL (ref 0.2–0.3)
Abnormal Protein Band1: 1.2 g/dL
Albumin ELP: 4.2 g/dL (ref 3.8–4.8)
Alpha-2-Globulin: 0.8 g/dL (ref 0.5–0.9)
BETA GLOBULIN: 0.4 g/dL (ref 0.4–0.6)
Beta 2: 0.3 g/dL (ref 0.2–0.5)
Gamma Globulin: 1.9 g/dL — ABNORMAL HIGH (ref 0.8–1.7)
IGA: 116 mg/dL (ref 69–380)
IGM, SERUM: 21 mg/dL — AB (ref 52–322)
IgG (Immunoglobin G), Serum: 2050 mg/dL — ABNORMAL HIGH (ref 690–1700)
Total Protein, Serum Electrophoresis: 7.8 g/dL (ref 6.1–8.1)

## 2015-05-09 LAB — BETA 2 MICROGLOBULIN, SERUM: BETA 2 MICROGLOBULIN: 3.25 mg/L — AB (ref <=2.51)

## 2015-05-09 LAB — KAPPA/LAMBDA LIGHT CHAINS
KAPPA LAMBDA RATIO: 4.31 — AB (ref 0.26–1.65)
Kappa free light chain: 7.11 mg/dL — ABNORMAL HIGH (ref 0.33–1.94)
LAMBDA FREE LGHT CHN: 1.65 mg/dL (ref 0.57–2.63)

## 2015-05-14 ENCOUNTER — Other Ambulatory Visit: Payer: Commercial Managed Care - HMO

## 2015-05-16 ENCOUNTER — Other Ambulatory Visit: Payer: Self-pay | Admitting: Hematology & Oncology

## 2015-05-19 ENCOUNTER — Other Ambulatory Visit: Payer: Self-pay | Admitting: *Deleted

## 2015-05-19 ENCOUNTER — Telehealth: Payer: Self-pay | Admitting: *Deleted

## 2015-05-19 ENCOUNTER — Encounter: Payer: Self-pay | Admitting: Hematology & Oncology

## 2015-05-19 DIAGNOSIS — C9000 Multiple myeloma not having achieved remission: Secondary | ICD-10-CM

## 2015-05-19 LAB — 24 HR URINE,KAPPA/LAMBDA LIGHT CHAINS
24H Urine Volume: 2400 mL/24 h
Measured Kappa Chain: <0.4 mg/dL (ref <2.00)

## 2015-05-19 LAB — UIFE/LIGHT CHAINS/TP QN, 24-HR UR
ALPHA 2 UR: DETECTED — AB
Albumin, U: DETECTED
Alpha 1, Urine: DETECTED — AB
Beta, Urine: DETECTED — AB
Gamma Globulin, Urine: DETECTED — AB
TIME-UPE24: 24 h
TOTAL PROTEIN, URINE-UPE24: 4 mg/dL — AB (ref 5–24)
VOLUME, URINE-UPE24: 2400 mL

## 2015-05-19 MED ORDER — LENALIDOMIDE 20 MG PO CAPS: ORAL_CAPSULE | ORAL | Status: DC

## 2015-05-19 NOTE — Telephone Encounter (Addendum)
Patient is aware of results.   ----- Message from [name], MD sent at 05/19/2015  1:29 PM EST ----- Call - urine shows very little, if any, myeloma protein!!!  This is great!!  pete

## 2015-05-22 ENCOUNTER — Telehealth: Payer: Self-pay | Admitting: Hematology & Oncology

## 2015-05-22 NOTE — Telephone Encounter (Signed)
[employer] ([employer] Disability papers completed and faxed along w med records today to:  Insured ID: [id number] Claim Event ID: [id number] Policy No: [id number]   F: [id number] P: [phone]     COPY SCANNED

## 2015-05-27 ENCOUNTER — Ambulatory Visit (HOSPITAL_BASED_OUTPATIENT_CLINIC_OR_DEPARTMENT_OTHER): Payer: Commercial Managed Care - HMO

## 2015-05-27 ENCOUNTER — Ambulatory Visit: Payer: Commercial Managed Care - HMO | Admitting: Hematology & Oncology

## 2015-05-27 ENCOUNTER — Ambulatory Visit: Payer: Commercial Managed Care - HMO

## 2015-05-27 ENCOUNTER — Other Ambulatory Visit (HOSPITAL_BASED_OUTPATIENT_CLINIC_OR_DEPARTMENT_OTHER): Payer: Commercial Managed Care - HMO

## 2015-05-27 ENCOUNTER — Other Ambulatory Visit: Payer: Commercial Managed Care - HMO

## 2015-05-27 ENCOUNTER — Encounter: Payer: Self-pay | Admitting: Hematology & Oncology

## 2015-05-27 ENCOUNTER — Ambulatory Visit (HOSPITAL_BASED_OUTPATIENT_CLINIC_OR_DEPARTMENT_OTHER): Payer: Commercial Managed Care - HMO | Admitting: Hematology & Oncology

## 2015-05-27 VITALS — BP 112/58 | HR 70 | Temp 97.6°F | Resp 16 | Ht 64.0 in | Wt 161.0 lb

## 2015-05-27 DIAGNOSIS — C9000 Multiple myeloma not having achieved remission: Secondary | ICD-10-CM

## 2015-05-27 DIAGNOSIS — Z5112 Encounter for antineoplastic immunotherapy: Secondary | ICD-10-CM | POA: Diagnosis not present

## 2015-05-27 DIAGNOSIS — C9001 Multiple myeloma in remission: Secondary | ICD-10-CM

## 2015-05-27 DIAGNOSIS — C9002 Multiple myeloma in relapse: Secondary | ICD-10-CM

## 2015-05-27 LAB — CMP (CANCER CENTER ONLY)
ALK PHOS: 47 U/L (ref 26–84)
ALT: 27 U/L (ref 10–47)
AST: 32 U/L (ref 11–38)
Albumin: 3.6 g/dL (ref 3.3–5.5)
BUN: 16 mg/dL (ref 7–22)
CALCIUM: 9.5 mg/dL (ref 8.0–10.3)
CO2: 31 meq/L (ref 18–33)
Chloride: 97 mEq/L — ABNORMAL LOW (ref 98–108)
Creat: 1.4 mg/dl — ABNORMAL HIGH (ref 0.6–1.2)
GLUCOSE: 80 mg/dL (ref 73–118)
POTASSIUM: 3.8 meq/L (ref 3.3–4.7)
Sodium: 142 mEq/L (ref 128–145)
Total Bilirubin: 0.8 mg/dl (ref 0.20–1.60)
Total Protein: 8.5 g/dL — ABNORMAL HIGH (ref 6.4–8.1)

## 2015-05-27 LAB — CBC WITH DIFFERENTIAL (CANCER CENTER ONLY)
BASO#: 0 10*3/uL (ref 0.0–0.2)
BASO%: 0.5 % (ref 0.0–2.0)
EOS%: 5.9 % (ref 0.0–7.0)
Eosinophils Absolute: 0.1 10*3/uL (ref 0.0–0.5)
HCT: 27.5 % — ABNORMAL LOW (ref 34.8–46.6)
HGB: 9 g/dL — ABNORMAL LOW (ref 11.6–15.9)
LYMPH#: 0.7 10*3/uL — ABNORMAL LOW (ref 0.9–3.3)
LYMPH%: 36.1 % (ref 14.0–48.0)
MCH: 29.8 pg (ref 26.0–34.0)
MCHC: 32.7 g/dL (ref 32.0–36.0)
MCV: 91 fL (ref 81–101)
MONO#: 0.3 10*3/uL (ref 0.1–0.9)
MONO%: 14.1 % — ABNORMAL HIGH (ref 0.0–13.0)
NEUT#: 0.9 10*3/uL — ABNORMAL LOW (ref 1.5–6.5)
NEUT%: 43.4 % (ref 39.6–80.0)
PLATELETS: 138 10*3/uL — AB (ref 145–400)
RBC: 3.02 10*6/uL — ABNORMAL LOW (ref 3.70–5.32)
RDW: 14.1 % (ref 11.1–15.7)
WBC: 2.1 10*3/uL — ABNORMAL LOW (ref 3.9–10.0)

## 2015-05-27 MED ORDER — BORTEZOMIB CHEMO SQ INJECTION 3.5 MG (2.5MG/ML)
1.1700 mg/m2 | Freq: Once | INTRAMUSCULAR | Status: AC
  Administered 2015-05-27: 2.25 mg via SUBCUTANEOUS
  Filled 2015-05-27: qty 2.25

## 2015-05-27 MED ORDER — ONDANSETRON HCL 8 MG PO TABS: 8.0000 mg | ORAL_TABLET | Freq: Once | ORAL | Status: DC

## 2015-05-27 NOTE — Patient Instructions (Signed)
Bortezomib injection What is this medicine? BORTEZOMIB (bor TEZ oh mib) is a medicine that targets proteins in cancer cells and stops the cancer cells from growing. It is used to treat multiple myeloma and mantle-cell lymphoma. This medicine may be used for other purposes; ask your health care provider or pharmacist if you have questions. What should I tell my health care provider before I take this medicine? They need to know if you have any of these conditions: -diabetes -heart disease -irregular heartbeat -liver disease -on hemodialysis -low blood counts, like low white blood cells, platelets, or hemoglobin -peripheral neuropathy -taking medicine for blood pressure -an unusual or allergic reaction to bortezomib, mannitol, boron, other medicines, foods, dyes, or preservatives -pregnant or trying to get pregnant -breast-feeding How should I use this medicine? This medicine is for injection into a vein or for injection under the skin. It is given by a health care professional in a hospital or clinic setting. Talk to your pediatrician regarding the use of this medicine in children. Special care may be needed. Overdosage: If you think you have taken too much of this medicine contact a poison control center or emergency room at once. NOTE: This medicine is only for you. Do not share this medicine with others. What if I miss a dose? It is important not to miss your dose. Call your doctor or health care professional if you are unable to keep an appointment. What may interact with this medicine? This medicine may interact with the following medications: -ketoconazole -rifampin -ritonavir -St. John's Wort This list may not describe all possible interactions. Give your health care provider a list of all the medicines, herbs, non-prescription drugs, or dietary supplements you use. Also tell them if you smoke, drink alcohol, or use illegal drugs. Some items may interact with your medicine. What  should I watch for while using this medicine? Visit your doctor for checks on your progress. This drug may make you feel generally unwell. This is not uncommon, as chemotherapy can affect healthy cells as well as cancer cells. Report any side effects. Continue your course of treatment even though you feel ill unless your doctor tells you to stop. You may get drowsy or dizzy. Do not drive, use machinery, or do anything that needs mental alertness until you know how this medicine affects you. Do not stand or sit up quickly, especially if you are an older patient. This reduces the risk of dizzy or fainting spells. In some cases, you may be given additional medicines to help with side effects. Follow all directions for their use. Call your doctor or health care professional for advice if you get a fever, chills or sore throat, or other symptoms of a cold or flu. Do not treat yourself. This drug decreases your body's ability to fight infections. Try to avoid being around people who are sick. This medicine may increase your risk to bruise or bleed. Call your doctor or health care professional if you notice any unusual bleeding. You may need blood work done while you are taking this medicine. In some patients, this medicine may cause a serious brain infection that may cause death. If you have any problems seeing, thinking, speaking, walking, or standing, tell your doctor right away. If you cannot reach your doctor, urgently seek other source of medical care. Do not become pregnant while taking this medicine. Women should inform their doctor if they wish to become pregnant or think they might be pregnant. There is a potential for serious  side effects to an unborn child. Talk to your health care professional or pharmacist for more information. Do not breast-feed an infant while taking this medicine. Check with your doctor or health care professional if you get an attack of severe diarrhea, nausea and vomiting, or if  you sweat a lot. The loss of too much body fluid can make it dangerous for you to take this medicine. What side effects may I notice from receiving this medicine? Side effects that you should report to your doctor or health care professional as soon as possible: -allergic reactions like skin rash, itching or hives, swelling of the face, lips, or tongue -breathing problems -changes in hearing -changes in vision -fast, irregular heartbeat -feeling faint or lightheaded, falls -pain, tingling, numbness in the hands or feet -right upper belly pain -seizures -swelling of the ankles, feet, hands -unusual bleeding or bruising -unusually weak or tired -vomiting -yellowing of the eyes or skin Side effects that usually do not require medical attention (report to your doctor or health care professional if they continue or are bothersome): -changes in emotions or moods -constipation -diarrhea -loss of appetite -headache -irritation at site where injected -nausea This list may not describe all possible side effects. Call your doctor for medical advice about side effects. You may report side effects to FDA at 1-800-FDA-1088. Where should I keep my medicine? This drug is given in a hospital or clinic and will not be stored at home. NOTE: This sheet is a summary. It may not cover all possible information. If you have questions about this medicine, talk to your doctor, pharmacist, or health care provider.    2016, [employer] Standard. (2014-06-25 14:47:04)

## 2015-05-27 NOTE — Progress Notes (Signed)
Hematology and Oncology Follow Up Visit  CADYNCE GARRETTE [id number] 04-Jul-1955 60 y.o. 05/27/2015   Principle Diagnosis:  IgG kappa myeloma   Current Therapy:     Velcade/Revlimid/ -Velcade every 3 week dosing  Zometa 4 mg IV every month     Interim History:  Ms.  Tuch is back for a follow-up. She feels good. She and her husband had a very quiet New Year's celebration area  Her myeloma numbers are slowly going up. Her last M spike was 1.1 g/dL. Her IgG level was 2050 mg/dL. Her Kappa light chain was 7.11 mg/dL.  She feels well. Her back is not bothering her.  Her appetite is good. She has gained a little weight.  There is no change in bowel or bladder habits.  She's had no bleeding.  She's had no nausea or vomiting.  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 I'm not sure of this is from the Velcade.  Currently, her performance status is ECOG 1  Medications:  Current outpatient prescriptions:  .  acetaminophen (TYLENOL) 500 MG tablet, Take 500 mg by mouth every 6 (six) hours as needed for mild pain or headache., Disp: , Rfl:  .  aspirin 325 MG tablet, Take 325 mg by mouth daily., Disp: , Rfl:  .  belladona alk-PHENObarbital (DONNATAL) 16.2 MG tablet, Take 1-2 pills, Every 8 hours AS NEEDED, for abdominal spasms., Disp: 60 tablet, Rfl: 1 .  Cholecalciferol (VITAMIN D) 1000 UNITS capsule, Take 2,000 Units by mouth 2 (two) times daily. 2 IN AM AND 2 IN PM, Disp: , Rfl:  .  ergocalciferol (VITAMIN D2) 50000 UNITS capsule, Take 1 capsule (50,000 Units total) by mouth once a week., Disp: 12 capsule, Rfl: 3 .  famotidine (PEPCID) 40 MG tablet, Take 1 tablet (40 mg total) by mouth 2 (two) times daily., Disp: 30 tablet, Rfl: 6 .  fluocinonide cream (LIDEX) 0.05 %, APPLY TO THE AFFECTED AREAS ON BODY TWICE DAILY UPON IRRITATION ONLY, Disp: , Rfl: 2 .  KLOR-CON 8 MEQ tablet, 1 TABLET ONCE A DAY ORALLY 30 DAY(S), Disp: , Rfl: 6 .  Lenalidomide (REVLIMID) 20 MG CAPS, Take one capsule  daily for 21 days then off for 7 days. HQPR#[id number], Disp: 21 capsule, Rfl: 0 .  magnesium aspartate (MAGINEX) 615 MG tablet, Take 1 tablet (615 mg total) by mouth 2 (two) times daily., Disp: 60 tablet, Rfl: 6 .  magnesium oxide (MAG-OX) 400 (241.3 MG) MG tablet, Take 1 tablet (400 mg total) by mouth 2 (two) times daily., Disp: 60 tablet, Rfl: 3 .  Multiple Vitamin (MULTIVITAMIN) tablet, Take 1 tablet by mouth every evening. , Disp: , Rfl:  .  ondansetron (ZOFRAN) 8 MG tablet, Take 1 tablet (8 mg total) by mouth 2 (two) times daily. For nausea & vomiting. Take 1 tablet 1 hour prior to chemotherapy., Disp: 20 tablet, Rfl: 0 .  pantoprazole (PROTONIX) 40 MG tablet, TAKE 1 TABLET (40 MG TOTAL) BY MOUTH 2 (TWO) TIMES DAILY., Disp: , Rfl: 6 .  senna-docusate (SENOKOT-S) 8.6-50 MG per tablet, Take 4 tablets by mouth 2 (two) times daily., Disp: , Rfl:  .  spironolactone (ALDACTONE) 50 MG tablet, Take 50 mg by mouth daily., Disp: , Rfl: 4 .  sulfamethoxazole-trimethoprim (BACTRIM DS) 800-160 MG tablet, Take 1 tablet by mouth 2 (two) times daily. For 3 days, Disp: 6 tablet, Rfl: 0 .  torsemide (DEMADEX) 20 MG tablet, Take 1 tablet (20 mg total) by mouth daily as needed., Disp: 30  tablet, Rfl: 6  Allergies:  Allergies  Allergen Reactions  . Codeine Palpitations    Past Medical History, Surgical history, Social history, and Family History were reviewed and updated.  Review of Systems: As above  Physical Exam:  height is _0  (1.626 m) and weight is 161 lb (73.029 kg). Her oral temperature is 97.6 F (36.4 C). Her blood pressure is 112/58 and her pulse is 70. Her respiration is 16.   Well-developed and well-nourished African American female. Head and neck exam shows no ocular or oral lesions. There are no palpable cervical or supraclavicular lymph nodes. Lungs are clear. Cardiac exam regular rate and rhythm with no murmurs, rubs or bruits. Abdomen is soft. She is mildly obese. She really has no  abdominal distention. She has no palpable liver or spleen tip. Back exam shows no tenderness over the spine, ribs or hips. Extremity shows no clubbing, cyanosis or edema. Skin exam shows no rashes, ecchymoses or petechia. Neurological exam is nonfocal.  Lab Results  Component Value Date   WBC 2.1* 05/27/2015   HGB 9.0* 05/27/2015   HCT 27.5* 05/27/2015   MCV 91 05/27/2015   PLT 138* 05/27/2015     Chemistry      Component Value Date/Time   NA 142 05/27/2015 0950   NA 138 03/17/2015 1113   NA 138 02/04/2015 0826   K 3.8 05/27/2015 0950   K 4.2 03/17/2015 1113   K 3.9 02/04/2015 0826   CL 97* 05/27/2015 0950   CL 100 02/04/2015 0826   CO2 31 05/27/2015 0950   CO2 27 03/17/2015 1113   CO2 28 02/04/2015 0826   BUN 16 05/27/2015 0950   BUN 19.8 03/17/2015 1113   BUN 19 02/04/2015 0826   CREATININE 1.4* 05/27/2015 0950   CREATININE 1.1 03/17/2015 1113   CREATININE 1.07* 02/04/2015 0826      Component Value Date/Time   CALCIUM 9.5 05/27/2015 0950   CALCIUM 10.1 03/17/2015 1113   CALCIUM 9.6 02/04/2015 0826   ALKPHOS 47 05/27/2015 0950   ALKPHOS 52 03/17/2015 1113   ALKPHOS 47 06/03/2014 0122   AST 32 05/27/2015 0950   AST 23 03/17/2015 1113   AST 40* 06/03/2014 0122   ALT 27 05/27/2015 0950   ALT 17 03/17/2015 1113   ALT 43* 06/03/2014 0122   BILITOT 0.80 05/27/2015 0950   BILITOT 0.49 03/17/2015 1113   BILITOT 0.5 06/03/2014 0122         Impression and Plan: Ms. Leth is 60 year old African-American female with IgG kappa myeloma.   It is hard to say what is going on with the myeloma. I do worry that the myeloma is becoming a little more active. Her M spike is slowly creeping up. Her IgG is holding fairly stable. Her Kappa spike is also going up slowly.  For right now, she is fairly asymptomatic. However, is certainly possible and probably likely that we are going to have to adjust her treatment protocol and have the Velcade done weekly.  I will see what the  myeloma his numbers look like. If they are higher, then I will plan to increase the frequency of Velcade.  She is a little more anemic today. Again, she is asymptomatic with this area and her erythropoietin level that we check back in August was only 13. She certainly would qualify for Aranesp.  We are also checking her iron studies.   I spent about 30 minutes with her.   [name], MD 1/17/201711:14 AM

## 2015-05-28 LAB — IGG, IGA, IGM
IgA, Qn, Serum: 119 mg/dL (ref 87–352)
IgG, Qn, Serum: 2000 mg/dL — ABNORMAL HIGH (ref 700–1600)
IgM, Qn, Serum: 21 mg/dL — ABNORMAL LOW (ref 26–217)

## 2015-05-28 LAB — KAPPA/LAMBDA LIGHT CHAINS
IG KAPPA FREE LIGHT CHAIN: 70.21 mg/L — AB (ref 3.30–19.40)
IG LAMBDA FREE LIGHT CHAIN: 20.51 mg/L (ref 5.71–26.30)
KAPPA/LAMBDA FLC RATIO: 3.42 — AB (ref 0.26–1.65)

## 2015-05-29 ENCOUNTER — Ambulatory Visit: Payer: Commercial Managed Care - HMO

## 2015-05-29 VITALS — BP 103/64 | HR 79 | Temp 97.5°F | Resp 16

## 2015-05-29 DIAGNOSIS — C9001 Multiple myeloma in remission: Secondary | ICD-10-CM

## 2015-05-29 MED ORDER — ZOLEDRONIC ACID 4 MG/100ML IV SOLN
4.0000 mg | Freq: Once | INTRAVENOUS | Status: DC
  Filled 2015-05-29: qty 100

## 2015-05-29 NOTE — Patient Instructions (Signed)
Multiple Myeloma Multiple myeloma is a form of cancer that results from the uncontrolled growth of abnormal plasma cells. Plasma cells are a type of white blood cell produced in the soft tissue inside bones (bone marrow). These are cells in your blood that normally help you fight infection. They are part of your body's defense system (immune system). Plasma cells that become cancerous will grow out of control. As a result, they interfere with normal blood cells and many important functions that normal cells perform in your body. With multiple myeloma, the abnormal plasma cells cause multiple tumors to form. Multiple myeloma damages your bones and causes other health problems because of its effect on blood cells. The disease progresses and reduces your ability to fight off infections. CAUSES The cause of multiple myeloma is not known. RISK FACTORS Risk factors include:  Being older than 32.  Being African American.  Having a family history of the disease. SIGNS AND SYMPTOMS Signs and symptoms of multiple myeloma may include:  Bone pain, especially in the back, ribs, and hips.  Broken bones (fractures).  Low blood counts, including reduced red blood cells (anemia), reduced white blood cells (leukopenia), and reduced platelets.  Fatigue.  Weakness.  Infections.  Bleeding, such as bleeding from the nose or gums, or increased bleeding from a scrape or cut.  High blood calcium levels.  Increased urination.  Confusion. DIAGNOSIS Your health care provider will do a physical exam and take your medical history. Tests will be done to help confirm the diagnosis. Tests may include:  Blood tests.  Urine tests.  X-rays.  MRI.  Bone marrow biopsy. In this test, a sample of marrow is removed from one of your bones. The sample is viewed under a microscope to check for abnormal plasma cells. TREATMENT There is no cure for multiple myeloma. Treatment options may vary depending on how much  the disease has advanced. Possible treatment options may include:  Medicines that kill cancer cells (chemotherapy).  Medicines that help prevent bone damage (bisphosphonates).  Radiation therapy. High-energy rays are used to kill cancer cells.  Surgery. This may be done to repair damage to bone.  Targeted drug therapy. These medicines block the growth and spread of cancer cells.  Immunotherapy. This is also called biologic therapy. It involves the use of medicines to strengthen the ability of your immune system to fight cancer cells.  Stem cell transplant. Healthy stem cells are infused into your body. These stem cells produce new blood cells to replace those killed by the disease or by other treatments. The healthy cells that are transplanted may be your own or may come from another person.  Plasmapheresis. This is a procedure used to remove plasma cells from your blood.  Other medicines to treat problems such as infections or pain. HOME CARE INSTRUCTIONS  Take medicines only as directed by your health care provider.  Drink enough fluid to keep your urine clear or pale yellow.  Eat a well-balanced diet. Work with a dietitian to make sure you are getting the nutrition you need.  Take vitamins or dietary supplements as directed by your health care provider or dietitian.  Stay active. Talk to your health care provider about what types of exercises and activities are safe for you.  Avoid activities that cause increased pain.  Do not lift anything heavier than 10 lb (4.5 kg).  Consider joining a support group or seeking counseling to help you cope with the stress of having multiple myeloma.  Keep all  follow-up visits as directed by your health care provider. This is important. SEEK MEDICAL CARE IF:  Your pain is not controlled with medicine or is getting worse.  You have a fever.  You have swollen legs.  You have weakness or dizziness.  You have unexplained weight  loss.  You have unexplained bleeding or bruising.  You have a cough or cold symptoms.  You feel depressed.  You have changes in urination or bowel movements. SEEK IMMEDIATE MEDICAL CARE IF:  You have sudden severe pain, especially back pain.  You have numbness or weakness in your arms, hands, legs, or feet.  You have confusion.  You have weakness on one side of your body.  You have slurred speech.  You have trouble staying awake.  You have shortness of breath.  You have blood in your stool or urine.  You vomit or cough up blood.   This information is not intended to replace advice given to you by your health care provider. Make sure you discuss any questions you have with your health care provider.   Document Released: 01/19/2001 Document Revised: 05/17/2014 Document Reviewed: 11/27/2013 Elsevier Interactive Patient Education [employer].

## 2015-05-29 NOTE — Progress Notes (Signed)
Attempted to start PIV in [facility]. Vein had scar tissue and IV wouldn't thread. Attempted to start another IV but patient stated she didn't want a second stick and would reschedule appointment to next week.

## 2015-05-30 LAB — PROTEIN ELECTROPHORESIS, SERUM, WITH REFLEX
A/G Ratio: 1.1 (ref 0.7–1.7)
ALBUMIN: 3.8 g/dL (ref 2.9–4.4)
ALPHA 1: 0.2 g/dL (ref 0.0–0.4)
Alpha 2: 0.6 g/dL (ref 0.4–1.0)
BETA: 1 g/dL (ref 0.7–1.3)
GAMMA GLOBULIN: 1.8 g/dL (ref 0.4–1.8)
Globulin, Total: 3.6 g/dL (ref 2.2–3.9)
Interpretation(See Below): 0
M-Spike, %: 1 g/dL — ABNORMAL HIGH
Total Protein: 7.4 g/dL (ref 6.0–8.5)

## 2015-06-04 ENCOUNTER — Ambulatory Visit (HOSPITAL_BASED_OUTPATIENT_CLINIC_OR_DEPARTMENT_OTHER): Payer: Commercial Managed Care - HMO

## 2015-06-04 VITALS — BP 97/58 | HR 75 | Temp 98.0°F | Resp 16

## 2015-06-04 DIAGNOSIS — C9001 Multiple myeloma in remission: Secondary | ICD-10-CM

## 2015-06-04 DIAGNOSIS — C9000 Multiple myeloma not having achieved remission: Secondary | ICD-10-CM

## 2015-06-04 MED ORDER — ZOLEDRONIC ACID 4 MG/100ML IV SOLN
4.0000 mg | Freq: Once | INTRAVENOUS | Status: AC
  Administered 2015-06-04: 4 mg via INTRAVENOUS
  Filled 2015-06-04: qty 100

## 2015-06-04 MED ORDER — SODIUM CHLORIDE 0.9 % IV SOLN
INTRAVENOUS | Status: DC
  Administered 2015-06-04: 09:00:00 via INTRAVENOUS

## 2015-06-04 NOTE — Patient Instructions (Signed)

## 2015-06-05 ENCOUNTER — Ambulatory Visit (INDEPENDENT_AMBULATORY_CARE_PROVIDER_SITE_OTHER): Payer: Commercial Managed Care - HMO | Admitting: Gastroenterology

## 2015-06-05 ENCOUNTER — Encounter: Payer: Self-pay | Admitting: Gastroenterology

## 2015-06-05 VITALS — BP 102/62 | HR 76 | Ht 63.19 in | Wt 165.1 lb

## 2015-06-05 DIAGNOSIS — K5909 Other constipation: Secondary | ICD-10-CM

## 2015-06-05 DIAGNOSIS — K219 Gastro-esophageal reflux disease without esophagitis: Secondary | ICD-10-CM

## 2015-06-05 DIAGNOSIS — K589 Irritable bowel syndrome without diarrhea: Secondary | ICD-10-CM

## 2015-06-05 MED ORDER — POLYETHYLENE GLYCOL 3350 17 GM/SCOOP PO POWD: ORAL | Status: AC

## 2015-06-05 MED ORDER — DOCUSATE SODIUM 100 MG PO CAPS: 100.0000 mg | ORAL_CAPSULE | Freq: Two times a day (BID) | ORAL | Status: DC | PRN

## 2015-06-05 NOTE — Patient Instructions (Signed)
Use Align once daily this is purchased over the counter Use Colace 1 tablet twice a day as needed Use Miralax 1 capful daily as needed Follow up as needed

## 2015-06-05 NOTE — Progress Notes (Signed)
Barbara Cook    [id number]    09-04-1955  Primary Care Physician:PHARR,WALTER DAVIDSON, MD  Referring Physician: [name], MD [address] [place], [place] [address]  Chief complaint:   Constipation, bloating HPI:  60 year old female with history of chronic GERD and irritable bowel syndrome here for follow-up visit with complaints of worsening constipation and bloating.  She also has multiple myeloma and is getting injections and feels whenever she gets the injections her symptoms are worse.  She is currently taking Colace and senna but does not feel that they're helping as much.  Her last colonoscopy in 2007 did not have any polyps. Denies any nausea, vomiting, abdominal pain, melena or bright red blood per rectum    Outpatient Encounter Prescriptions as of 06/05/2015  Medication Sig  . acetaminophen (TYLENOL) 500 MG tablet Take 500 mg by mouth every 6 (six) hours as needed for mild pain or headache.  Marland Kitchen aspirin 325 MG tablet Take 325 mg by mouth daily.  Lahoma Rocker alk-PHENObarbital (DONNATAL) 16.2 MG tablet Take 1-2 pills, Every 8 hours AS NEEDED, for abdominal spasms.  . bortezomib IV (VELCADE) 3.5 MG injection Inject 1.3 mg/m2 into the vein once.  . ergocalciferol (VITAMIN D2) 50000 UNITS capsule Take 1 capsule (50,000 Units total) by mouth once a week.  . famotidine (PEPCID) 40 MG tablet Take 1 tablet (40 mg total) by mouth 2 (two) times daily.  Marland Kitchen KLOR-CON 8 MEQ tablet 1 TABLET ONCE A DAY ORALLY 30 DAY(S)  . Lenalidomide (REVLIMID) 20 MG CAPS Take one capsule daily for 21 days then off for 7 days. [id number]  . magnesium oxide (MAG-OX) 400 (241.3 MG) MG tablet Take 1 tablet (400 mg total) by mouth 2 (two) times daily.  . Multiple Vitamin (MULTIVITAMIN) tablet Take 1 tablet by mouth every evening.   . ondansetron (ZOFRAN) 8 MG tablet Take 1 tablet (8 mg total) by mouth 2 (two) times daily. For nausea & vomiting. Take 1 tablet 1 hour prior to  chemotherapy.  . pantoprazole (PROTONIX) 40 MG tablet TAKE 1 TABLET (40 MG TOTAL) BY MOUTH 2 (TWO) TIMES DAILY.  Marland Kitchen spironolactone (ALDACTONE) 50 MG tablet Take 50 mg by mouth daily.  Marland Kitchen torsemide (DEMADEX) 20 MG tablet Take 1 tablet (20 mg total) by mouth daily as needed.  . [DISCONTINUED] senna-docusate (SENOKOT-S) 8.6-50 MG per tablet Take 4 tablets by mouth 2 (two) times daily.  Marland Kitchen docusate sodium (COLACE) 100 MG capsule Take 1 capsule (100 mg total) by mouth 2 (two) times daily as needed for mild constipation.  . polyethylene glycol powder (GLYCOLAX/MIRALAX) powder 1 capful daily as needed  . [DISCONTINUED] Cholecalciferol (VITAMIN D) 1000 UNITS capsule Take 2,000 Units by mouth 2 (two) times daily. 2 IN AM AND 2 IN PM  . [DISCONTINUED] fluocinonide cream (LIDEX) 0.05 % APPLY TO THE AFFECTED AREAS ON BODY TWICE DAILY UPON IRRITATION ONLY  . [DISCONTINUED] magnesium aspartate (MAGINEX) 615 MG tablet Take 1 tablet (615 mg total) by mouth 2 (two) times daily.  . [DISCONTINUED] sulfamethoxazole-trimethoprim (BACTRIM DS) 800-160 MG tablet Take 1 tablet by mouth 2 (two) times daily. For 3 days   No facility-administered encounter medications on file as of 06/05/2015.    Allergies as of 06/05/2015 - Review Complete 06/05/2015  Allergen Reaction Noted  . Codeine Palpitations 04/10/2008    Past Medical History  Diagnosis Date  . Arthropathy, unspecified, site unspecified   . GERD (gastroesophageal reflux disease)   .  Arthritis   . MGUS (monoclonal gammopathy of unknown significance) 10/06/202013  . Endometriosis   . Fibroid   . Polymyalgia rheumatica ([place])   . IBS (irritable bowel syndrome)   . Multiple myeloma ([facility]) 02/18/2014  . Myelitis ([place]) 2011    of bone- neck  . Family history of adverse reaction to anesthesia     Sister- patient will find out  . Complication of anesthesia     02/13/14- had biospy in X-Ray- "twilight"  "I felt every thing"  . PONV (postoperative nausea and vomiting)      1990  . Constipation     Past Surgical History  Procedure Laterality Date  . Hernia repair      umbilicial - age 74  . Tubal ligation    . Neck surgery  2011    Myletis- bone grafting- from her left hip  . Fistula repair surg    . Knee arthroscopy Left   . Colonoscopy    . Radiology with anesthesia N/A 08/23/2014    Procedure: T12  ABLATION       (RADIOLOGY WITH ANESTHESIA);  Surgeon: Luanne Bras, MD;  Location: [facility];  Service: Radiology;  Laterality: N/A;    Family History  Problem Relation Age of Onset  . Lung cancer Maternal Uncle   . Throat cancer Maternal Aunt   . Diabetes Maternal Grandmother   . Hypertension Maternal Grandmother   . Heart disease Maternal Grandmother   . Heart disease Paternal Grandmother   . Colon cancer Neg Hx   . Heart disease Mother   . Rheum arthritis Mother     Social History   Social History  . Marital Status: Married    Spouse Name: [name]  . Number of Children: 1  . Years of Education: 12   Occupational History  . [employer]   Social History Main Topics  . Smoking status: Former Smoker -- 25.00 packs/day for 5 years    Types: Cigarettes    Start date: 05/26/1988    Quit date: 01/11/1994  . Smokeless tobacco: Never Used     Comment: QUIT SMOKING 20 YEARS AGO  . Alcohol Use: No  . Drug Use: No  . Sexual Activity: Not on file     Comment: BTL   Other Topics Concern  . Not on file   Social History Narrative   Patient lives at home with her husband [name]). Patient works full time.   Education- High school   Right handed.   Caffeine- None      Review of systems: Review of Systems  Constitutional: Negative for fever and chills.  HENT: Negative.   Eyes: Negative for blurred vision.  Respiratory: Negative for cough, shortness of breath and wheezing.   Cardiovascular: Negative for chest pain and palpitations.  Gastrointestinal: as per HPI Genitourinary: Negative for dysuria, urgency,  frequency and hematuria.  Musculoskeletal: Negative for myalgias, back pain and joint pain.  Skin: Negative for itching and rash.  Neurological: Negative for dizziness, tremors, focal weakness, seizures and loss of consciousness.  Endo/Heme/Allergies: Negative for environmental allergies.  Psychiatric/Behavioral: Negative for depression, suicidal ideas and hallucinations.  All other systems reviewed and are negative.   Physical Exam: Filed Vitals:   06/05/15 0917  BP: 102/62  Pulse: 76   Gen:      No acute distress HEENT:  EOMI, sclera anicteric Neck:     No masses; no thyromegaly Lungs:    Clear to auscultation bilaterally; normal respiratory effort  CV:         Regular rate and rhythm; no murmurs Abd:      + bowel sounds; soft, non-tender; no palpable masses, no distension Ext:    No edema; adequate peripheral perfusion Skin:      Warm and dry; no rash Neuro: alert and oriented x 3 Psych: normal mood and affect  Data Reviewed:  Reviewed her chart in epic   Assessment and Plan/Recommendations:  60 year old female with history of multiple myeloma , GERD an irritable bowel syndrome here for follow-up visit with worsening symptoms  IBS with predominant constipation:  Start MiraLAX half capful daily and titrate as needed to have 1 soft bowel movement per day  Her last colonoscopy was in 2007,  Is due for recall in July 2017 but given her multiple myeloma  being currently active,  may have to reconsider doing a screening colonoscopy for colorectal cancer.  We'll discuss with oncology about it  Continue PPI and follow antireflux measures  Increase fluid and dietary fiber intake  Okay to use probiotic as needed  Return as needed  K. Denzil Magnuson , MD [phone] Mon-Fri 8a-5p [phone] after 5p, weekends, holidays

## 2015-06-11 ENCOUNTER — Other Ambulatory Visit: Payer: Self-pay | Admitting: Hematology & Oncology

## 2015-06-11 ENCOUNTER — Other Ambulatory Visit: Payer: Self-pay | Admitting: *Deleted

## 2015-06-11 DIAGNOSIS — C9001 Multiple myeloma in remission: Secondary | ICD-10-CM

## 2015-06-11 MED ORDER — LENALIDOMIDE 20 MG PO CAPS: ORAL_CAPSULE | ORAL | Status: DC

## 2015-06-16 ENCOUNTER — Ambulatory Visit: Payer: Commercial Managed Care - HMO

## 2015-06-16 ENCOUNTER — Other Ambulatory Visit: Payer: Commercial Managed Care - HMO

## 2015-06-16 ENCOUNTER — Ambulatory Visit: Payer: Commercial Managed Care - HMO | Admitting: Hematology & Oncology

## 2015-06-17 ENCOUNTER — Ambulatory Visit: Payer: Commercial Managed Care - HMO | Admitting: Hematology & Oncology

## 2015-06-17 ENCOUNTER — Ambulatory Visit: Payer: Commercial Managed Care - HMO

## 2015-06-17 ENCOUNTER — Other Ambulatory Visit: Payer: Commercial Managed Care - HMO

## 2015-06-18 ENCOUNTER — Other Ambulatory Visit (HOSPITAL_BASED_OUTPATIENT_CLINIC_OR_DEPARTMENT_OTHER): Payer: Commercial Managed Care - HMO

## 2015-06-18 ENCOUNTER — Ambulatory Visit (HOSPITAL_BASED_OUTPATIENT_CLINIC_OR_DEPARTMENT_OTHER): Payer: Commercial Managed Care - HMO | Admitting: Hematology & Oncology

## 2015-06-18 ENCOUNTER — Ambulatory Visit (HOSPITAL_BASED_OUTPATIENT_CLINIC_OR_DEPARTMENT_OTHER): Payer: Commercial Managed Care - HMO

## 2015-06-18 VITALS — BP 109/43 | HR 84 | Temp 97.8°F | Wt 160.0 lb

## 2015-06-18 DIAGNOSIS — C9001 Multiple myeloma in remission: Secondary | ICD-10-CM

## 2015-06-18 DIAGNOSIS — Z5112 Encounter for antineoplastic immunotherapy: Secondary | ICD-10-CM

## 2015-06-18 DIAGNOSIS — C9000 Multiple myeloma not having achieved remission: Secondary | ICD-10-CM

## 2015-06-18 DIAGNOSIS — C9002 Multiple myeloma in relapse: Secondary | ICD-10-CM

## 2015-06-18 LAB — FERRITIN: Ferritin: 282 ng/ml — ABNORMAL HIGH (ref 9–269)

## 2015-06-18 LAB — CMP (CANCER CENTER ONLY)
ALBUMIN: 3.3 g/dL (ref 3.3–5.5)
ALT(SGPT): 27 U/L (ref 10–47)
AST: 28 U/L (ref 11–38)
Alkaline Phosphatase: 54 U/L (ref 26–84)
BILIRUBIN TOTAL: 0.6 mg/dL (ref 0.20–1.60)
BUN, Bld: 14 mg/dL (ref 7–22)
CHLORIDE: 107 meq/L (ref 98–108)
CO2: 28 meq/L (ref 18–33)
CREATININE: 1 mg/dL (ref 0.6–1.2)
Calcium: 9.3 mg/dL (ref 8.0–10.3)
GLUCOSE: 93 mg/dL (ref 73–118)
Potassium: 4.1 mEq/L (ref 3.3–4.7)
SODIUM: 141 meq/L (ref 128–145)
Total Protein: 7.8 g/dL (ref 6.4–8.1)

## 2015-06-18 LAB — CBC WITH DIFFERENTIAL (CANCER CENTER ONLY)
BASO#: 0 10*3/uL (ref 0.0–0.2)
BASO%: 1 % (ref 0.0–2.0)
EOS ABS: 0.1 10*3/uL (ref 0.0–0.5)
EOS%: 6.8 % (ref 0.0–7.0)
HEMATOCRIT: 25.7 % — AB (ref 34.8–46.6)
HEMOGLOBIN: 8.5 g/dL — AB (ref 11.6–15.9)
LYMPH#: 0.7 10*3/uL — AB (ref 0.9–3.3)
LYMPH%: 34.5 % (ref 14.0–48.0)
MCH: 29.8 pg (ref 26.0–34.0)
MCHC: 33.1 g/dL (ref 32.0–36.0)
MCV: 90 fL (ref 81–101)
MONO#: 0.3 10*3/uL (ref 0.1–0.9)
MONO%: 14.6 % — AB (ref 0.0–13.0)
NEUT#: 0.9 10*3/uL — ABNORMAL LOW (ref 1.5–6.5)
NEUT%: 43.1 % (ref 39.6–80.0)
Platelets: 146 10*3/uL (ref 145–400)
RBC: 2.85 10*6/uL — AB (ref 3.70–5.32)
RDW: 14.1 % (ref 11.1–15.7)
WBC: 2.1 10*3/uL — ABNORMAL LOW (ref 3.9–10.0)

## 2015-06-18 LAB — IRON AND TIBC
%SAT: 31 % (ref 21–57)
Iron: 84 ug/dL (ref 41–142)
TIBC: 270 ug/dL (ref 236–444)
UIBC: 186 ug/dL (ref 120–384)

## 2015-06-18 LAB — CHCC SATELLITE - SMEAR

## 2015-06-18 MED ORDER — BORTEZOMIB CHEMO SQ INJECTION 3.5 MG (2.5MG/ML)
1.1700 mg/m2 | Freq: Once | INTRAMUSCULAR | Status: AC
  Administered 2015-06-18: 2.25 mg via SUBCUTANEOUS
  Filled 2015-06-18: qty 2.25

## 2015-06-18 NOTE — Patient Instructions (Signed)
Bortezomib injection What is this medicine? BORTEZOMIB (bor TEZ oh mib) is a medicine that targets proteins in cancer cells and stops the cancer cells from growing. It is used to treat multiple myeloma and mantle-cell lymphoma. This medicine may be used for other purposes; ask your health care provider or pharmacist if you have questions. What should I tell my health care provider before I take this medicine? They need to know if you have any of these conditions: -diabetes -heart disease -irregular heartbeat -liver disease -on hemodialysis -low blood counts, like low white blood cells, platelets, or hemoglobin -peripheral neuropathy -taking medicine for blood pressure -an unusual or allergic reaction to bortezomib, mannitol, boron, other medicines, foods, dyes, or preservatives -pregnant or trying to get pregnant -breast-feeding How should I use this medicine? This medicine is for injection into a vein or for injection under the skin. It is given by a health care professional in a hospital or clinic setting. Talk to your pediatrician regarding the use of this medicine in children. Special care may be needed. Overdosage: If you think you have taken too much of this medicine contact a poison control center or emergency room at once. NOTE: This medicine is only for you. Do not share this medicine with others. What if I miss a dose? It is important not to miss your dose. Call your doctor or health care professional if you are unable to keep an appointment. What may interact with this medicine? This medicine may interact with the following medications: -ketoconazole -rifampin -ritonavir -St. John's Wort This list may not describe all possible interactions. Give your health care provider a list of all the medicines, herbs, non-prescription drugs, or dietary supplements you use. Also tell them if you smoke, drink alcohol, or use illegal drugs. Some items may interact with your medicine. What  should I watch for while using this medicine? Visit your doctor for checks on your progress. This drug may make you feel generally unwell. This is not uncommon, as chemotherapy can affect healthy cells as well as cancer cells. Report any side effects. Continue your course of treatment even though you feel ill unless your doctor tells you to stop. You may get drowsy or dizzy. Do not drive, use machinery, or do anything that needs mental alertness until you know how this medicine affects you. Do not stand or sit up quickly, especially if you are an older patient. This reduces the risk of dizzy or fainting spells. In some cases, you may be given additional medicines to help with side effects. Follow all directions for their use. Call your doctor or health care professional for advice if you get a fever, chills or sore throat, or other symptoms of a cold or flu. Do not treat yourself. This drug decreases your body's ability to fight infections. Try to avoid being around people who are sick. This medicine may increase your risk to bruise or bleed. Call your doctor or health care professional if you notice any unusual bleeding. You may need blood work done while you are taking this medicine. In some patients, this medicine may cause a serious brain infection that may cause death. If you have any problems seeing, thinking, speaking, walking, or standing, tell your doctor right away. If you cannot reach your doctor, urgently seek other source of medical care. Do not become pregnant while taking this medicine. Women should inform their doctor if they wish to become pregnant or think they might be pregnant. There is a potential for serious  side effects to an unborn child. Talk to your health care professional or pharmacist for more information. Do not breast-feed an infant while taking this medicine. Check with your doctor or health care professional if you get an attack of severe diarrhea, nausea and vomiting, or if  you sweat a lot. The loss of too much body fluid can make it dangerous for you to take this medicine. What side effects may I notice from receiving this medicine? Side effects that you should report to your doctor or health care professional as soon as possible: -allergic reactions like skin rash, itching or hives, swelling of the face, lips, or tongue -breathing problems -changes in hearing -changes in vision -fast, irregular heartbeat -feeling faint or lightheaded, falls -pain, tingling, numbness in the hands or feet -right upper belly pain -seizures -swelling of the ankles, feet, hands -unusual bleeding or bruising -unusually weak or tired -vomiting -yellowing of the eyes or skin Side effects that usually do not require medical attention (report to your doctor or health care professional if they continue or are bothersome): -changes in emotions or moods -constipation -diarrhea -loss of appetite -headache -irritation at site where injected -nausea This list may not describe all possible side effects. Call your doctor for medical advice about side effects. You may report side effects to FDA at 1-800-FDA-1088. Where should I keep my medicine? This drug is given in a hospital or clinic and will not be stored at home. NOTE: This sheet is a summary. It may not cover all possible information. If you have questions about this medicine, talk to your doctor, pharmacist, or health care provider.    2016, [employer] Standard. (2014-06-25 14:47:04)

## 2015-06-19 LAB — IGG, IGA, IGM
IGA/IMMUNOGLOBULIN A, SERUM: 118 mg/dL (ref 87–352)
IgM, Qn, Serum: 19 mg/dL — ABNORMAL LOW (ref 26–217)

## 2015-06-19 LAB — KAPPA/LAMBDA LIGHT CHAINS
Ig Kappa Free Light Chain: 61.83 mg/L — ABNORMAL HIGH (ref 3.30–19.40)
Ig Lambda Free Light Chain: 18.88 mg/L (ref 5.71–26.30)
Kappa/Lambda FluidC Ratio: 3.27 — ABNORMAL HIGH (ref 0.26–1.65)

## 2015-06-19 LAB — RETICULOCYTES: Reticulocyte Count: 0.7 % (ref 0.6–2.6)

## 2015-06-20 NOTE — Progress Notes (Signed)
Hematology and Oncology Follow Up Visit  Barbara Cook [id number] November 10, 1955 60 y.o. 06/20/2015   Principle Diagnosis:  IgG kappa myeloma   Current Therapy:     Velcade/Revlimid/ -Velcade every 3 week dosing  Zometa 4 mg IV every month     Interim History:  Ms.  Cook is back for a follow-up. She feels good. She has had no real complaints. Her back is not bothering her all that much.  Her last myeloma studies looked relatively stable.  She is becoming a little more anemic. We did check her erythropoietin level back last year. It was only 12.7.  Her appetite is doing okay. She's had no nausea or vomiting. She's had no fever. There's been no cough. She's had a little bit of leg swelling but this is chronic.  Overall,, her performance status is ECOG 1  Medications:  Current outpatient prescriptions:  .  acetaminophen (TYLENOL) 500 MG tablet, Take 500 mg by mouth every 6 (six) hours as needed for mild pain or headache., Disp: , Rfl:  .  aspirin 325 MG tablet, Take 325 mg by mouth daily., Disp: , Rfl:  .  bortezomib IV (VELCADE) 3.5 MG injection, Inject 1.3 mg/m2 into the vein once., Disp: , Rfl:  .  docusate sodium (COLACE) 100 MG capsule, Take 1 capsule (100 mg total) by mouth 2 (two) times daily as needed for mild constipation., Disp: 60 capsule, Rfl: 3 .  ergocalciferol (VITAMIN D2) 50000 UNITS capsule, Take 1 capsule (50,000 Units total) by mouth once a week., Disp: 12 capsule, Rfl: 3 .  famotidine (PEPCID) 40 MG tablet, Take 1 tablet (40 mg total) by mouth 2 (two) times daily., Disp: 30 tablet, Rfl: 6 .  KLOR-CON 8 MEQ tablet, 1 TABLET ONCE A DAY ORALLY 30 DAY(S), Disp: , Rfl: 6 .  lenalidomide (REVLIMID) 20 MG capsule, TAKE 1 CAPSULE BY MOUTH EVERY DAY FOR 21 DAYS, THEN OFF 7 DAYS. [id number], Disp: 21 capsule, Rfl: 0 .  magnesium oxide (MAG-OX) 400 (241.3 MG) MG tablet, Take 1 tablet (400 mg total) by mouth 2 (two) times daily., Disp: 60 tablet, Rfl: 3 .  Multiple Vitamin  (MULTIVITAMIN) tablet, Take 1 tablet by mouth every evening. , Disp: , Rfl:  .  ondansetron (ZOFRAN) 8 MG tablet, Take 1 tablet (8 mg total) by mouth 2 (two) times daily. For nausea & vomiting. Take 1 tablet 1 hour prior to chemotherapy., Disp: 20 tablet, Rfl: 0 .  pantoprazole (PROTONIX) 40 MG tablet, TAKE 1 TABLET (40 MG TOTAL) BY MOUTH 2 (TWO) TIMES DAILY., Disp: , Rfl: 6 .  polyethylene glycol powder (GLYCOLAX/MIRALAX) powder, 1 capful daily as needed, Disp: 255 g, Rfl: 3 .  spironolactone (ALDACTONE) 50 MG tablet, Take 50 mg by mouth daily., Disp: , Rfl: 4 .  torsemide (DEMADEX) 20 MG tablet, Take 1 tablet (20 mg total) by mouth daily as needed., Disp: 30 tablet, Rfl: 6  Allergies:  Allergies  Allergen Reactions  . Codeine Palpitations    Past Medical History, Surgical history, Social history, and Family History were reviewed and updated.  Review of Systems: As above  Physical Exam:  weight is 160 lb (72.576 kg). Her temperature is 97.8 F (36.6 C). Her blood pressure is 109/43 and her pulse is 84.   Well-developed and well-nourished African American female. Head and neck exam shows no ocular or oral lesions. There are no palpable cervical or supraclavicular lymph nodes. Lungs are clear. Cardiac exam regular rate and rhythm with no murmurs,  rubs or bruits. Abdomen is soft. She is mildly obese. She really has no abdominal distention. She has no palpable liver or spleen tip. Back exam shows no tenderness over the spine, ribs or hips. Extremity shows no clubbing, cyanosis or edema. Skin exam shows no rashes, ecchymoses or petechia. Neurological exam is nonfocal.  Lab Results  Component Value Date   WBC 2.1* 06/18/2015   HGB 8.5* 06/18/2015   HCT 25.7* 06/18/2015   MCV 90 06/18/2015   PLT 146 06/18/2015     Chemistry      Component Value Date/Time   NA 141 06/18/2015 1044   NA 138 03/17/2015 1113   NA 138 02/04/2015 0826   K 4.1 06/18/2015 1044   K 4.2 03/17/2015 1113   K 3.9  02/04/2015 0826   CL 107 06/18/2015 1044   CL 100 02/04/2015 0826   CO2 28 06/18/2015 1044   CO2 27 03/17/2015 1113   CO2 28 02/04/2015 0826   BUN 14 06/18/2015 1044   BUN 19.8 03/17/2015 1113   BUN 19 02/04/2015 0826   CREATININE 1.0 06/18/2015 1044   CREATININE 1.1 03/17/2015 1113   CREATININE 1.07* 02/04/2015 0826      Component Value Date/Time   CALCIUM 9.3 06/18/2015 1044   CALCIUM 10.1 03/17/2015 1113   CALCIUM 9.6 02/04/2015 0826   ALKPHOS 54 06/18/2015 1044   ALKPHOS 52 03/17/2015 1113   ALKPHOS 47 06/03/2014 0122   AST 28 06/18/2015 1044   AST 23 03/17/2015 1113   AST 40* 06/03/2014 0122   ALT 27 06/18/2015 1044   ALT 17 03/17/2015 1113   ALT 43* 06/03/2014 0122   BILITOT 0.60 06/18/2015 1044   BILITOT 0.49 03/17/2015 1113   BILITOT 0.5 06/03/2014 0122         Impression and Plan: Barbara Cook is 60 year old African-American female with IgG kappa myeloma.   It looks like the myelomas holding pretty steady right now. Her IgG level is 1832 mg/dL. I spent about 30 minutes with her.  I noted that her hemoglobin is dropping. Her erythropoietin level is only 12.7. As such, I think that she clearly would benefit from Aranesp. She is asymptomatic right now. I would like to hold off on any Aranesp for as long as I can. If we find that she is becoming more symptomatic, then I think we can use this to help with her anemia.  I will like to see her back in another month.   [name], MD 2/10/20175:55 PM

## 2015-06-24 LAB — PROTEIN ELECTROPHORESIS, SERUM, WITH REFLEX
A/G Ratio: 1.1 (ref 0.7–1.7)
ALPHA 1: 0.2 g/dL (ref 0.0–0.4)
ALPHA 2: 0.6 g/dL (ref 0.4–1.0)
Albumin: 3.7 g/dL (ref 2.9–4.4)
BETA: 1.1 g/dL (ref 0.7–1.3)
Gamma Globulin: 1.6 g/dL (ref 0.4–1.8)
Globulin, Total: 3.4 g/dL (ref 2.2–3.9)
INTERPRETATION(SEE BELOW): 0
M-SPIKE, %: 0.9 g/dL — AB
Total Protein: 7.1 g/dL (ref 6.0–8.5)

## 2015-07-04 ENCOUNTER — Encounter: Payer: Self-pay | Admitting: Hematology & Oncology

## 2015-07-07 ENCOUNTER — Inpatient Hospital Stay: Payer: Commercial Managed Care - HMO

## 2015-07-07 ENCOUNTER — Ambulatory Visit: Payer: Commercial Managed Care - HMO | Admitting: Hematology & Oncology

## 2015-07-07 ENCOUNTER — Ambulatory Visit: Payer: Commercial Managed Care - HMO

## 2015-07-07 ENCOUNTER — Other Ambulatory Visit: Payer: Commercial Managed Care - HMO

## 2015-07-09 ENCOUNTER — Other Ambulatory Visit: Payer: Commercial Managed Care - HMO

## 2015-07-10 ENCOUNTER — Ambulatory Visit: Payer: Commercial Managed Care - HMO

## 2015-07-11 ENCOUNTER — Other Ambulatory Visit (HOSPITAL_BASED_OUTPATIENT_CLINIC_OR_DEPARTMENT_OTHER): Payer: Commercial Managed Care - HMO

## 2015-07-11 ENCOUNTER — Encounter: Payer: Self-pay | Admitting: Hematology & Oncology

## 2015-07-11 ENCOUNTER — Ambulatory Visit (HOSPITAL_BASED_OUTPATIENT_CLINIC_OR_DEPARTMENT_OTHER): Payer: Commercial Managed Care - HMO | Admitting: Hematology & Oncology

## 2015-07-11 ENCOUNTER — Ambulatory Visit (HOSPITAL_BASED_OUTPATIENT_CLINIC_OR_DEPARTMENT_OTHER): Payer: Commercial Managed Care - HMO

## 2015-07-11 VITALS — BP 122/57 | HR 80 | Temp 97.4°F | Resp 16 | Ht 63.0 in | Wt 161.0 lb

## 2015-07-11 DIAGNOSIS — C9002 Multiple myeloma in relapse: Secondary | ICD-10-CM

## 2015-07-11 DIAGNOSIS — C9000 Multiple myeloma not having achieved remission: Secondary | ICD-10-CM

## 2015-07-11 DIAGNOSIS — Z5112 Encounter for antineoplastic immunotherapy: Secondary | ICD-10-CM

## 2015-07-11 LAB — CBC WITH DIFFERENTIAL (CANCER CENTER ONLY)
BASO#: 0 10*3/uL (ref 0.0–0.2)
BASO%: 0.8 % (ref 0.0–2.0)
EOS ABS: 0.1 10*3/uL (ref 0.0–0.5)
EOS%: 5.3 % (ref 0.0–7.0)
HEMATOCRIT: 29.9 % — AB (ref 34.8–46.6)
HEMOGLOBIN: 9.9 g/dL — AB (ref 11.6–15.9)
LYMPH#: 0.7 10*3/uL — AB (ref 0.9–3.3)
LYMPH%: 27.3 % (ref 14.0–48.0)
MCH: 30.4 pg (ref 26.0–34.0)
MCHC: 33.1 g/dL (ref 32.0–36.0)
MCV: 92 fL (ref 81–101)
MONO#: 0.2 10*3/uL (ref 0.1–0.9)
MONO%: 7.6 % (ref 0.0–13.0)
NEUT%: 59 % (ref 39.6–80.0)
NEUTROS ABS: 1.6 10*3/uL (ref 1.5–6.5)
Platelets: 129 10*3/uL — ABNORMAL LOW (ref 145–400)
RBC: 3.26 10*6/uL — ABNORMAL LOW (ref 3.70–5.32)
RDW: 14.5 % (ref 11.1–15.7)
WBC: 2.6 10*3/uL — ABNORMAL LOW (ref 3.9–10.0)

## 2015-07-11 LAB — CMP (CANCER CENTER ONLY)
ALBUMIN: 3.8 g/dL (ref 3.3–5.5)
ALT(SGPT): 30 U/L (ref 10–47)
AST: 29 U/L (ref 11–38)
Alkaline Phosphatase: 52 U/L (ref 26–84)
BILIRUBIN TOTAL: 0.8 mg/dL (ref 0.20–1.60)
BUN, Bld: 15 mg/dL (ref 7–22)
CALCIUM: 9.2 mg/dL (ref 8.0–10.3)
CHLORIDE: 103 meq/L (ref 98–108)
CO2: 28 meq/L (ref 18–33)
Creat: 1.1 mg/dl (ref 0.6–1.2)
GLUCOSE: 85 mg/dL (ref 73–118)
POTASSIUM: 4.1 meq/L (ref 3.3–4.7)
Sodium: 138 mEq/L (ref 128–145)
Total Protein: 8.3 g/dL — ABNORMAL HIGH (ref 6.4–8.1)

## 2015-07-11 MED ORDER — BORTEZOMIB CHEMO SQ INJECTION 3.5 MG (2.5MG/ML)
1.1700 mg/m2 | Freq: Once | INTRAMUSCULAR | Status: AC
  Administered 2015-07-11: 2.25 mg via SUBCUTANEOUS
  Filled 2015-07-11: qty 2.25

## 2015-07-11 NOTE — Progress Notes (Signed)
Hematology and Oncology Follow Up Visit  KYLA FANTAUZZI [id number] 1956-04-27 60 y.o. 07/11/2015   Principle Diagnosis:  IgG kappa myeloma   Current Therapy:     Velcade/Revlimid/ -Velcade every 3 week dosing  Zometa 4 mg IV every month     Interim History:  Ms.  Barbara Cook is back for a follow-up. She feels good. She has had no real complaints. Her back is not bothering her all that much.  Her last myeloma studies looked relatively stable.  Her M spike was 0.9 g/dL. Her IgG level was 1832 mg/dL.   Her Kappa Lightchain was 6.17mg /dl She is becoming a little more anemic. We did check her erythropoietin level back last year. It was only 12.7.  Her appetite is doing okay. She's had no nausea or vomiting. She's had no fever. There's been no cough. She's had a little bit of leg swelling but this is chronic.  Overall,, her performance status is ECOG 1  Medications:  Current outpatient prescriptions:  .  acetaminophen (TYLENOL) 500 MG tablet, Take 500 mg by mouth every 6 (six) hours as needed for mild pain or headache., Disp: , Rfl:  .  aspirin 325 MG tablet, Take 325 mg by mouth daily., Disp: , Rfl:  .  bortezomib IV (VELCADE) 3.5 MG injection, Inject 1.3 mg/m2 into the vein once., Disp: , Rfl:  .  docusate sodium (COLACE) 100 MG capsule, Take 1 capsule (100 mg total) by mouth 2 (two) times daily as needed for mild constipation., Disp: 60 capsule, Rfl: 3 .  ergocalciferol (VITAMIN D2) 50000 UNITS capsule, Take 1 capsule (50,000 Units total) by mouth once a week., Disp: 12 capsule, Rfl: 3 .  famotidine (PEPCID) 40 MG tablet, Take 1 tablet (40 mg total) by mouth 2 (two) times daily., Disp: 30 tablet, Rfl: 6 .  KLOR-CON 8 MEQ tablet, 1 TABLET ONCE A DAY ORALLY 30 DAY(S), Disp: , Rfl: 6 .  lenalidomide (REVLIMID) 20 MG capsule, TAKE 1 CAPSULE BY MOUTH EVERY DAY FOR 21 DAYS, THEN OFF 7 DAYS. [id number], Disp: 21 capsule, Rfl: 0 .  magnesium oxide (MAG-OX) 400 (241.3 MG) MG tablet, Take 1  tablet (400 mg total) by mouth 2 (two) times daily., Disp: 60 tablet, Rfl: 3 .  Multiple Vitamin (MULTIVITAMIN) tablet, Take 1 tablet by mouth every evening. , Disp: , Rfl:  .  ondansetron (ZOFRAN) 8 MG tablet, Take 1 tablet (8 mg total) by mouth 2 (two) times daily. For nausea & vomiting. Take 1 tablet 1 hour prior to chemotherapy., Disp: 20 tablet, Rfl: 0 .  pantoprazole (PROTONIX) 40 MG tablet, TAKE 1 TABLET (40 MG TOTAL) BY MOUTH 2 (TWO) TIMES DAILY., Disp: , Rfl: 6 .  polyethylene glycol powder (GLYCOLAX/MIRALAX) powder, 1 capful daily as needed, Disp: 255 g, Rfl: 3 .  spironolactone (ALDACTONE) 50 MG tablet, Take 50 mg by mouth daily., Disp: , Rfl: 4 .  torsemide (DEMADEX) 20 MG tablet, Take 1 tablet (20 mg total) by mouth daily as needed., Disp: 30 tablet, Rfl: 6  Allergies:  Allergies  Allergen Reactions  . Codeine Palpitations    Past Medical History, Surgical history, Social history, and Family History were reviewed and updated.  Review of Systems: As above  Physical Exam:  height is 5\' 3"  (1.6 m) and weight is 161 lb (73.029 kg). Her oral temperature is 97.4 F (36.3 C). Her blood pressure is 122/57 and her pulse is 80. Her respiration is 16.   Well-developed and well-nourished African American female.  Head and neck exam shows no ocular or oral lesions. There are no palpable cervical or supraclavicular lymph nodes. Lungs are clear. Cardiac exam regular rate and rhythm with no murmurs, rubs or bruits. Abdomen is soft. She is mildly obese. She really has no abdominal distention. She has no palpable liver or spleen tip. Back exam shows no tenderness over the spine, ribs or hips. Extremity shows no clubbing, cyanosis or edema. Skin exam shows no rashes, ecchymoses or petechia. Neurological exam is nonfocal.  Lab Results  Component Value Date   WBC 2.6* 07/11/2015   HGB 9.9* 07/11/2015   HCT 29.9* 07/11/2015   MCV 92 07/11/2015   PLT 129* 07/11/2015     Chemistry        Component Value Date/Time   NA 138 07/11/2015 0942   NA 138 03/17/2015 1113   NA 138 02/04/2015 0826   K 4.1 07/11/2015 0942   K 4.2 03/17/2015 1113   K 3.9 02/04/2015 0826   CL 103 07/11/2015 0942   CL 100 02/04/2015 0826   CO2 28 07/11/2015 0942   CO2 27 03/17/2015 1113   CO2 28 02/04/2015 0826   BUN 15 07/11/2015 0942   BUN 19.8 03/17/2015 1113   BUN 19 02/04/2015 0826   CREATININE 1.1 07/11/2015 0942   CREATININE 1.1 03/17/2015 1113   CREATININE 1.07* 02/04/2015 0826      Component Value Date/Time   CALCIUM 9.2 07/11/2015 0942   CALCIUM 10.1 03/17/2015 1113   CALCIUM 9.6 02/04/2015 0826   ALKPHOS 52 07/11/2015 0942   ALKPHOS 52 03/17/2015 1113   ALKPHOS 47 06/03/2014 0122   AST 29 07/11/2015 0942   AST 23 03/17/2015 1113   AST 40* 06/03/2014 0122   ALT 30 07/11/2015 0942   ALT 17 03/17/2015 1113   ALT 43* 06/03/2014 0122   BILITOT 0.80 07/11/2015 0942   BILITOT 0.49 03/17/2015 1113   BILITOT 0.5 06/03/2014 0122         Impression and Plan: Ms. Zakowski is 60 year old African-American female with IgG kappa myeloma.   I and let us she is doing better. I'm glad that her hemoglobin is better.    we will plan to get her back in 3 weeks.   [name], MD 3/3/20173:07 PM

## 2015-07-11 NOTE — Patient Instructions (Signed)
Bortezomib injection What is this medicine? BORTEZOMIB (bor TEZ oh mib) is a medicine that targets proteins in cancer cells and stops the cancer cells from growing. It is used to treat multiple myeloma and mantle-cell lymphoma. This medicine may be used for other purposes; ask your health care provider or pharmacist if you have questions. What should I tell my health care provider before I take this medicine? They need to know if you have any of these conditions: -diabetes -heart disease -irregular heartbeat -liver disease -on hemodialysis -low blood counts, like low white blood cells, platelets, or hemoglobin -peripheral neuropathy -taking medicine for blood pressure -an unusual or allergic reaction to bortezomib, mannitol, boron, other medicines, foods, dyes, or preservatives -pregnant or trying to get pregnant -breast-feeding How should I use this medicine? This medicine is for injection into a vein or for injection under the skin. It is given by a health care professional in a hospital or clinic setting. Talk to your pediatrician regarding the use of this medicine in children. Special care may be needed. Overdosage: If you think you have taken too much of this medicine contact a poison control center or emergency room at once. NOTE: This medicine is only for you. Do not share this medicine with others. What if I miss a dose? It is important not to miss your dose. Call your doctor or health care professional if you are unable to keep an appointment. What may interact with this medicine? This medicine may interact with the following medications: -ketoconazole -rifampin -ritonavir -St. John's Wort This list may not describe all possible interactions. Give your health care provider a list of all the medicines, herbs, non-prescription drugs, or dietary supplements you use. Also tell them if you smoke, drink alcohol, or use illegal drugs. Some items may interact with your medicine. What  should I watch for while using this medicine? Visit your doctor for checks on your progress. This drug may make you feel generally unwell. This is not uncommon, as chemotherapy can affect healthy cells as well as cancer cells. Report any side effects. Continue your course of treatment even though you feel ill unless your doctor tells you to stop. You may get drowsy or dizzy. Do not drive, use machinery, or do anything that needs mental alertness until you know how this medicine affects you. Do not stand or sit up quickly, especially if you are an older patient. This reduces the risk of dizzy or fainting spells. In some cases, you may be given additional medicines to help with side effects. Follow all directions for their use. Call your doctor or health care professional for advice if you get a fever, chills or sore throat, or other symptoms of a cold or flu. Do not treat yourself. This drug decreases your body's ability to fight infections. Try to avoid being around people who are sick. This medicine may increase your risk to bruise or bleed. Call your doctor or health care professional if you notice any unusual bleeding. You may need blood work done while you are taking this medicine. In some patients, this medicine may cause a serious brain infection that may cause death. If you have any problems seeing, thinking, speaking, walking, or standing, tell your doctor right away. If you cannot reach your doctor, urgently seek other source of medical care. Do not become pregnant while taking this medicine. Women should inform their doctor if they wish to become pregnant or think they might be pregnant. There is a potential for serious  side effects to an unborn child. Talk to your health care professional or pharmacist for more information. Do not breast-feed an infant while taking this medicine. Check with your doctor or health care professional if you get an attack of severe diarrhea, nausea and vomiting, or if  you sweat a lot. The loss of too much body fluid can make it dangerous for you to take this medicine. What side effects may I notice from receiving this medicine? Side effects that you should report to your doctor or health care professional as soon as possible: -allergic reactions like skin rash, itching or hives, swelling of the face, lips, or tongue -breathing problems -changes in hearing -changes in vision -fast, irregular heartbeat -feeling faint or lightheaded, falls -pain, tingling, numbness in the hands or feet -right upper belly pain -seizures -swelling of the ankles, feet, hands -unusual bleeding or bruising -unusually weak or tired -vomiting -yellowing of the eyes or skin Side effects that usually do not require medical attention (report to your doctor or health care professional if they continue or are bothersome): -changes in emotions or moods -constipation -diarrhea -loss of appetite -headache -irritation at site where injected -nausea This list may not describe all possible side effects. Call your doctor for medical advice about side effects. You may report side effects to FDA at 1-800-FDA-1088. Where should I keep my medicine? This drug is given in a hospital or clinic and will not be stored at home. NOTE: This sheet is a summary. It may not cover all possible information. If you have questions about this medicine, talk to your doctor, pharmacist, or health care provider.    2016, [employer] Standard. (2014-06-25 14:47:04)

## 2015-07-12 LAB — IGG, IGA, IGM
IGM (IMMUNOGLOBIN M), SRM: 27 mg/dL (ref 26–217)
IgA, Qn, Serum: 124 mg/dL (ref 87–352)
IgG, Qn, Serum: 2190 mg/dL — ABNORMAL HIGH (ref 700–1600)

## 2015-07-14 ENCOUNTER — Other Ambulatory Visit: Payer: Self-pay | Admitting: Hematology & Oncology

## 2015-07-14 ENCOUNTER — Other Ambulatory Visit: Payer: Self-pay | Admitting: *Deleted

## 2015-07-14 DIAGNOSIS — C9002 Multiple myeloma in relapse: Secondary | ICD-10-CM

## 2015-07-14 LAB — KAPPA/LAMBDA LIGHT CHAINS
IG LAMBDA FREE LIGHT CHAIN: 27.27 mg/L — AB (ref 5.71–26.30)
Ig Kappa Free Light Chain: 80.29 mg/L — ABNORMAL HIGH (ref 3.30–19.40)
Kappa/Lambda FluidC Ratio: 2.94 — ABNORMAL HIGH (ref 0.26–1.65)

## 2015-07-14 MED ORDER — LENALIDOMIDE 20 MG PO CAPS: ORAL_CAPSULE | ORAL | Status: DC

## 2015-07-15 LAB — PROTEIN ELECTROPHORESIS, SERUM, WITH REFLEX
A/G Ratio: 0.9 (ref 0.7–1.7)
ALPHA 1: 0.2 g/dL (ref 0.0–0.4)
ALPHA 2: 0.7 g/dL (ref 0.4–1.0)
Albumin: 3.7 g/dL (ref 2.9–4.4)
BETA: 0.9 g/dL (ref 0.7–1.3)
GLOBULIN, TOTAL: 3.9 g/dL (ref 2.2–3.9)
Gamma Globulin: 2 g/dL — ABNORMAL HIGH (ref 0.4–1.8)
INTERPRETATION(SEE BELOW): 0
M-Spike, %: 1.2 g/dL — ABNORMAL HIGH
Total Protein: 7.6 g/dL (ref 6.0–8.5)

## 2015-07-17 ENCOUNTER — Ambulatory Visit: Payer: Commercial Managed Care - HMO

## 2015-07-17 ENCOUNTER — Ambulatory Visit: Payer: Commercial Managed Care - HMO | Admitting: Hematology & Oncology

## 2015-07-17 ENCOUNTER — Other Ambulatory Visit: Payer: Commercial Managed Care - HMO

## 2015-07-24 ENCOUNTER — Encounter: Payer: Self-pay | Admitting: Gynecology

## 2015-07-25 ENCOUNTER — Telehealth: Payer: Self-pay | Admitting: Gastroenterology

## 2015-07-28 NOTE — Telephone Encounter (Signed)
Ulysses, though I agree with Dr. Woodward Ku assessment She may also consider second opinion at tertiary care center?

## 2015-07-29 NOTE — Telephone Encounter (Signed)
I informed patient of what Dr. Hilarie Fredrickson states and she does still want to transfer to Dr. Hilarie Fredrickson. Patient states that she will call back to schedule for June.

## 2015-07-30 ENCOUNTER — Other Ambulatory Visit (HOSPITAL_BASED_OUTPATIENT_CLINIC_OR_DEPARTMENT_OTHER): Payer: Commercial Managed Care - HMO

## 2015-07-30 ENCOUNTER — Ambulatory Visit (HOSPITAL_BASED_OUTPATIENT_CLINIC_OR_DEPARTMENT_OTHER): Payer: Commercial Managed Care - HMO | Admitting: Hematology & Oncology

## 2015-07-30 ENCOUNTER — Encounter: Payer: Self-pay | Admitting: Hematology & Oncology

## 2015-07-30 ENCOUNTER — Ambulatory Visit (HOSPITAL_BASED_OUTPATIENT_CLINIC_OR_DEPARTMENT_OTHER): Payer: Commercial Managed Care - HMO

## 2015-07-30 VITALS — BP 105/61 | HR 80 | Temp 98.7°F | Resp 16 | Ht 63.0 in | Wt 155.0 lb

## 2015-07-30 DIAGNOSIS — C9002 Multiple myeloma in relapse: Secondary | ICD-10-CM

## 2015-07-30 DIAGNOSIS — Z5112 Encounter for antineoplastic immunotherapy: Secondary | ICD-10-CM | POA: Diagnosis not present

## 2015-07-30 DIAGNOSIS — C9000 Multiple myeloma not having achieved remission: Secondary | ICD-10-CM

## 2015-07-30 LAB — CMP (CANCER CENTER ONLY)
ALT: 32 U/L (ref 10–47)
AST: 29 U/L (ref 11–38)
Albumin: 3.7 g/dL (ref 3.3–5.5)
Alkaline Phosphatase: 55 U/L (ref 26–84)
BILIRUBIN TOTAL: 0.8 mg/dL (ref 0.20–1.60)
BUN, Bld: 17 mg/dL (ref 7–22)
CO2: 32 meq/L (ref 18–33)
CREATININE: 1.2 mg/dL (ref 0.6–1.2)
Calcium: 10.3 mg/dL (ref 8.0–10.3)
Chloride: 101 mEq/L (ref 98–108)
GLUCOSE: 84 mg/dL (ref 73–118)
Potassium: 3.9 mEq/L (ref 3.3–4.7)
SODIUM: 143 meq/L (ref 128–145)
Total Protein: 8.8 g/dL — ABNORMAL HIGH (ref 6.4–8.1)

## 2015-07-30 LAB — CBC WITH DIFFERENTIAL (CANCER CENTER ONLY)
BASO#: 0 10*3/uL (ref 0.0–0.2)
BASO%: 0.7 % (ref 0.0–2.0)
EOS%: 2.6 % (ref 0.0–7.0)
Eosinophils Absolute: 0.1 10*3/uL (ref 0.0–0.5)
HCT: 31.3 % — ABNORMAL LOW (ref 34.8–46.6)
HGB: 10.4 g/dL — ABNORMAL LOW (ref 11.6–15.9)
LYMPH#: 1 10*3/uL (ref 0.9–3.3)
LYMPH%: 34.9 % (ref 14.0–48.0)
MCH: 30.2 pg (ref 26.0–34.0)
MCHC: 33.2 g/dL (ref 32.0–36.0)
MCV: 91 fL (ref 81–101)
MONO#: 0.5 10*3/uL (ref 0.1–0.9)
MONO%: 19.9 % — ABNORMAL HIGH (ref 0.0–13.0)
NEUT#: 1.1 10*3/uL — ABNORMAL LOW (ref 1.5–6.5)
NEUT%: 41.9 % (ref 39.6–80.0)
PLATELETS: 200 10*3/uL (ref 145–400)
RBC: 3.44 10*6/uL — AB (ref 3.70–5.32)
RDW: 14.7 % (ref 11.1–15.7)
WBC: 2.7 10*3/uL — AB (ref 3.9–10.0)

## 2015-07-30 MED ORDER — BORTEZOMIB CHEMO SQ INJECTION 3.5 MG (2.5MG/ML)
1.1700 mg/m2 | Freq: Once | INTRAMUSCULAR | Status: AC
  Administered 2015-07-30: 2.25 mg via SUBCUTANEOUS
  Filled 2015-07-30: qty 2.25

## 2015-07-30 MED ORDER — ONDANSETRON HCL 8 MG PO TABS: 8.0000 mg | ORAL_TABLET | Freq: Once | ORAL | Status: DC

## 2015-07-30 NOTE — Patient Instructions (Signed)
Bortezomib injection What is this medicine? BORTEZOMIB (bor TEZ oh mib) is a medicine that targets proteins in cancer cells and stops the cancer cells from growing. It is used to treat multiple myeloma and mantle-cell lymphoma. This medicine may be used for other purposes; ask your health care provider or pharmacist if you have questions. What should I tell my health care provider before I take this medicine? They need to know if you have any of these conditions: -diabetes -heart disease -irregular heartbeat -liver disease -on hemodialysis -low blood counts, like low white blood cells, platelets, or hemoglobin -peripheral neuropathy -taking medicine for blood pressure -an unusual or allergic reaction to bortezomib, mannitol, boron, other medicines, foods, dyes, or preservatives -pregnant or trying to get pregnant -breast-feeding How should I use this medicine? This medicine is for injection into a vein or for injection under the skin. It is given by a health care professional in a hospital or clinic setting. Talk to your pediatrician regarding the use of this medicine in children. Special care may be needed. Overdosage: If you think you have taken too much of this medicine contact a poison control center or emergency room at once. NOTE: This medicine is only for you. Do not share this medicine with others. What if I miss a dose? It is important not to miss your dose. Call your doctor or health care professional if you are unable to keep an appointment. What may interact with this medicine? This medicine may interact with the following medications: -ketoconazole -rifampin -ritonavir -St. John's Wort This list may not describe all possible interactions. Give your health care provider a list of all the medicines, herbs, non-prescription drugs, or dietary supplements you use. Also tell them if you smoke, drink alcohol, or use illegal drugs. Some items may interact with your medicine. What  should I watch for while using this medicine? Visit your doctor for checks on your progress. This drug may make you feel generally unwell. This is not uncommon, as chemotherapy can affect healthy cells as well as cancer cells. Report any side effects. Continue your course of treatment even though you feel ill unless your doctor tells you to stop. You may get drowsy or dizzy. Do not drive, use machinery, or do anything that needs mental alertness until you know how this medicine affects you. Do not stand or sit up quickly, especially if you are an older patient. This reduces the risk of dizzy or fainting spells. In some cases, you may be given additional medicines to help with side effects. Follow all directions for their use. Call your doctor or health care professional for advice if you get a fever, chills or sore throat, or other symptoms of a cold or flu. Do not treat yourself. This drug decreases your body's ability to fight infections. Try to avoid being around people who are sick. This medicine may increase your risk to bruise or bleed. Call your doctor or health care professional if you notice any unusual bleeding. You may need blood work done while you are taking this medicine. In some patients, this medicine may cause a serious brain infection that may cause death. If you have any problems seeing, thinking, speaking, walking, or standing, tell your doctor right away. If you cannot reach your doctor, urgently seek other source of medical care. Do not become pregnant while taking this medicine. Women should inform their doctor if they wish to become pregnant or think they might be pregnant. There is a potential for serious  side effects to an unborn child. Talk to your health care professional or pharmacist for more information. Do not breast-feed an infant while taking this medicine. Check with your doctor or health care professional if you get an attack of severe diarrhea, nausea and vomiting, or if  you sweat a lot. The loss of too much body fluid can make it dangerous for you to take this medicine. What side effects may I notice from receiving this medicine? Side effects that you should report to your doctor or health care professional as soon as possible: -allergic reactions like skin rash, itching or hives, swelling of the face, lips, or tongue -breathing problems -changes in hearing -changes in vision -fast, irregular heartbeat -feeling faint or lightheaded, falls -pain, tingling, numbness in the hands or feet -right upper belly pain -seizures -swelling of the ankles, feet, hands -unusual bleeding or bruising -unusually weak or tired -vomiting -yellowing of the eyes or skin Side effects that usually do not require medical attention (report to your doctor or health care professional if they continue or are bothersome): -changes in emotions or moods -constipation -diarrhea -loss of appetite -headache -irritation at site where injected -nausea This list may not describe all possible side effects. Call your doctor for medical advice about side effects. You may report side effects to FDA at 1-800-FDA-1088. Where should I keep my medicine? This drug is given in a hospital or clinic and will not be stored at home. NOTE: This sheet is a summary. It may not cover all possible information. If you have questions about this medicine, talk to your doctor, pharmacist, or health care provider.    2016, [employer] Standard. (2014-06-25 14:47:04)

## 2015-07-30 NOTE — Progress Notes (Signed)
Hematology and Oncology Follow Up Visit  Barbara Cook [id number] July 20, 1955 60 y.o. 07/30/2015   Principle Diagnosis:  IgG kappa myeloma   Current Therapy:     Velcade/Revlimid/ -Velcade every 3 week dosing  Zometa 4 mg IV every month     Interim History:  Ms.  Cook is back for a follow-up. She feels good. She has had no real complaints. Her back is not bothering her all that much.  Her last myeloma studies looked a little bit higher. Her M spike was 1.2 g/dL. Her IgG level was 2190 milligrams per deciliter. Her Kappa light chain was 8.3 mg/dL.   She's had occasional back discomfort. Today, it is not bothering her.   Overall,, her performance status is ECOG 1  Medications:  Current outpatient prescriptions:  .  acetaminophen (TYLENOL) 500 MG tablet, Take 500 mg by mouth every 6 (six) hours as needed for mild pain or headache., Disp: , Rfl:  .  aspirin 325 MG tablet, Take 325 mg by mouth daily., Disp: , Rfl:  .  bortezomib IV (VELCADE) 3.5 MG injection, Inject 1.3 mg/m2 into the vein once., Disp: , Rfl:  .  cephALEXin (KEFLEX) 500 MG capsule, , Disp: , Rfl:  .  docusate sodium (COLACE) 100 MG capsule, Take 1 capsule (100 mg total) by mouth 2 (two) times daily as needed for mild constipation., Disp: 60 capsule, Rfl: 3 .  ergocalciferol (VITAMIN D2) 50000 UNITS capsule, Take 1 capsule (50,000 Units total) by mouth once a week., Disp: 12 capsule, Rfl: 3 .  famotidine (PEPCID) 40 MG tablet, Take 1 tablet (40 mg total) by mouth 2 (two) times daily., Disp: 30 tablet, Rfl: 6 .  KLOR-CON 8 MEQ tablet, 1 TABLET ONCE A DAY ORALLY 30 DAY(S), Disp: , Rfl: 6 .  lenalidomide (REVLIMID) 20 MG capsule, TAKE 1 CAPSULE BY MOUTH EVERY DAY FOR 21 DAYS, THEN OFF 7 DAYS [id number], Disp: 21 capsule, Rfl: 0 .  magnesium oxide (MAG-OX) 400 (241.3 MG) MG tablet, Take 1 tablet (400 mg total) by mouth 2 (two) times daily., Disp: 60 tablet, Rfl: 3 .  Multiple Vitamin (MULTIVITAMIN) tablet, Take 1  tablet by mouth every evening. , Disp: , Rfl:  .  ondansetron (ZOFRAN) 8 MG tablet, Take 1 tablet (8 mg total) by mouth 2 (two) times daily. For nausea & vomiting. Take 1 tablet 1 hour prior to chemotherapy., Disp: 20 tablet, Rfl: 0 .  polyethylene glycol powder (GLYCOLAX/MIRALAX) powder, 1 capful daily as needed, Disp: 255 g, Rfl: 3 .  ranitidine (ZANTAC) 150 MG tablet, Take 150 mg by mouth 2 (two) times daily., Disp: , Rfl:  .  spironolactone (ALDACTONE) 50 MG tablet, Take 50 mg by mouth daily., Disp: , Rfl: 4 .  torsemide (DEMADEX) 20 MG tablet, Take 1 tablet (20 mg total) by mouth daily as needed., Disp: 30 tablet, Rfl: 6  Allergies:  Allergies  Allergen Reactions  . Codeine Palpitations    Past Medical History, Surgical history, Social history, and Family History were reviewed and updated.  Review of Systems: As above  Physical Exam:  height is 5\' 3"  (1.6 m) and weight is 155 lb (70.308 kg). Her oral temperature is 98.7 F (37.1 C). Her blood pressure is 105/61 and her pulse is 80. Her respiration is 16.   Well-developed and well-nourished African American female. Head and neck exam shows no ocular or oral lesions. There are no palpable cervical or supraclavicular lymph nodes. Lungs are clear. Cardiac exam regular rate  and rhythm with no murmurs, rubs or bruits. Abdomen is soft. She is mildly obese. She really has no abdominal distention. She has no palpable liver or spleen tip. Back exam shows no tenderness over the spine, ribs or hips. Extremity shows no clubbing, cyanosis or edema. Skin exam shows no rashes, ecchymoses or petechia. Neurological exam is nonfocal.  Lab Results  Component Value Date   WBC 2.7* 07/30/2015   HGB 10.4* 07/30/2015   HCT 31.3* 07/30/2015   MCV 91 07/30/2015   PLT 200 07/30/2015     Chemistry      Component Value Date/Time   NA 143 07/30/2015 1013   NA 138 03/17/2015 1113   NA 138 02/04/2015 0826   K 3.9 07/30/2015 1013   K 4.2 03/17/2015 1113     K 3.9 02/04/2015 0826   CL 101 07/30/2015 1013   CL 100 02/04/2015 0826   CO2 32 07/30/2015 1013   CO2 27 03/17/2015 1113   CO2 28 02/04/2015 0826   BUN 17 07/30/2015 1013   BUN 19.8 03/17/2015 1113   BUN 19 02/04/2015 0826   CREATININE 1.2 07/30/2015 1013   CREATININE 1.1 03/17/2015 1113   CREATININE 1.07* 02/04/2015 0826      Component Value Date/Time   CALCIUM 10.3 07/30/2015 1013   CALCIUM 10.1 03/17/2015 1113   CALCIUM 9.6 02/04/2015 0826   ALKPHOS 55 07/30/2015 1013   ALKPHOS 52 03/17/2015 1113   ALKPHOS 47 06/03/2014 0122   AST 29 07/30/2015 1013   AST 23 03/17/2015 1113   AST 40* 06/03/2014 0122   ALT 32 07/30/2015 1013   ALT 17 03/17/2015 1113   ALT 43* 06/03/2014 0122   BILITOT 0.80 07/30/2015 1013   BILITOT 0.49 03/17/2015 1113   BILITOT 0.5 06/03/2014 0122         Impression and Plan: Barbara Cook is 60 year old African-American female with IgG kappa myeloma.   I will have to see what her myeloma studies look like. Again a sort of fluctuate. The fact that her protein is up is somewhat concerning.  For now, we will plan to get her back in 3 weeks. She and her husband are going out of town in May, so we will have to watch for any adjustments in her treatment program.    [name], MD 3/22/201712:00 PM

## 2015-07-30 NOTE — Progress Notes (Signed)
OK to treat with anc of 1.1 per Dr Marin Olp. dph

## 2015-07-31 LAB — IGG, IGA, IGM
IGA/IMMUNOGLOBULIN A, SERUM: 133 mg/dL (ref 87–352)
IgG, Qn, Serum: 2259 mg/dL — ABNORMAL HIGH (ref 700–1600)
IgM, Qn, Serum: 24 mg/dL — ABNORMAL LOW (ref 26–217)

## 2015-07-31 LAB — KAPPA/LAMBDA LIGHT CHAINS
Ig Kappa Free Light Chain: 62.93 mg/L — ABNORMAL HIGH (ref 3.30–19.40)
Ig Lambda Free Light Chain: 17.33 mg/L (ref 5.71–26.30)
KAPPA/LAMBDA FLC RATIO: 3.63 — AB (ref 0.26–1.65)

## 2015-08-01 LAB — PROTEIN ELECTROPHORESIS, SERUM, WITH REFLEX
A/G Ratio: 0.9 (ref 0.7–1.7)
ALPHA 2: 0.6 g/dL (ref 0.4–1.0)
Albumin: 3.8 g/dL (ref 2.9–4.4)
Alpha 1: 0.2 g/dL (ref 0.0–0.4)
BETA: 1 g/dL (ref 0.7–1.3)
GAMMA GLOBULIN: 2.3 g/dL — AB (ref 0.4–1.8)
GLOBULIN, TOTAL: 4.2 g/dL — AB (ref 2.2–3.9)
Interpretation(See Below): 0
M-Spike, %: 1.1 g/dL — ABNORMAL HIGH
Total Protein: 8 g/dL (ref 6.0–8.5)

## 2015-08-04 ENCOUNTER — Ambulatory Visit (HOSPITAL_BASED_OUTPATIENT_CLINIC_OR_DEPARTMENT_OTHER): Payer: Commercial Managed Care - HMO

## 2015-08-04 VITALS — BP 117/43 | Temp 97.5°F | Resp 18

## 2015-08-04 DIAGNOSIS — C9002 Multiple myeloma in relapse: Secondary | ICD-10-CM

## 2015-08-04 MED ORDER — ZOLEDRONIC ACID 4 MG/100ML IV SOLN
4.0000 mg | Freq: Once | INTRAVENOUS | Status: AC
  Administered 2015-08-04: 4 mg via INTRAVENOUS
  Filled 2015-08-04: qty 100

## 2015-08-04 NOTE — Patient Instructions (Signed)

## 2015-08-08 ENCOUNTER — Other Ambulatory Visit: Payer: Self-pay | Admitting: Hematology & Oncology

## 2015-08-19 ENCOUNTER — Other Ambulatory Visit: Payer: Self-pay | Admitting: Hematology & Oncology

## 2015-08-21 ENCOUNTER — Encounter: Payer: Self-pay | Admitting: Hematology & Oncology

## 2015-08-21 ENCOUNTER — Other Ambulatory Visit (HOSPITAL_BASED_OUTPATIENT_CLINIC_OR_DEPARTMENT_OTHER): Payer: Commercial Managed Care - HMO

## 2015-08-21 ENCOUNTER — Ambulatory Visit (HOSPITAL_BASED_OUTPATIENT_CLINIC_OR_DEPARTMENT_OTHER): Payer: Commercial Managed Care - HMO | Admitting: Hematology & Oncology

## 2015-08-21 ENCOUNTER — Ambulatory Visit (HOSPITAL_BASED_OUTPATIENT_CLINIC_OR_DEPARTMENT_OTHER): Payer: Commercial Managed Care - HMO

## 2015-08-21 VITALS — BP 117/62 | HR 81 | Temp 97.5°F | Resp 16 | Ht 63.0 in | Wt 161.0 lb

## 2015-08-21 DIAGNOSIS — C9000 Multiple myeloma not having achieved remission: Secondary | ICD-10-CM | POA: Diagnosis not present

## 2015-08-21 DIAGNOSIS — Z5112 Encounter for antineoplastic immunotherapy: Secondary | ICD-10-CM

## 2015-08-21 DIAGNOSIS — D508 Other iron deficiency anemias: Secondary | ICD-10-CM | POA: Diagnosis not present

## 2015-08-21 DIAGNOSIS — M546 Pain in thoracic spine: Secondary | ICD-10-CM | POA: Diagnosis not present

## 2015-08-21 DIAGNOSIS — M542 Cervicalgia: Secondary | ICD-10-CM | POA: Diagnosis not present

## 2015-08-21 DIAGNOSIS — C9002 Multiple myeloma in relapse: Secondary | ICD-10-CM

## 2015-08-21 LAB — CBC WITH DIFFERENTIAL (CANCER CENTER ONLY)
BASO#: 0 10*3/uL (ref 0.0–0.2)
BASO%: 0.4 % (ref 0.0–2.0)
EOS%: 1.4 % (ref 0.0–7.0)
Eosinophils Absolute: 0 10*3/uL (ref 0.0–0.5)
HCT: 28.3 % — ABNORMAL LOW (ref 34.8–46.6)
HEMOGLOBIN: 9.5 g/dL — AB (ref 11.6–15.9)
LYMPH#: 0.9 10*3/uL (ref 0.9–3.3)
LYMPH%: 33.6 % (ref 14.0–48.0)
MCH: 30.3 pg (ref 26.0–34.0)
MCHC: 33.6 g/dL (ref 32.0–36.0)
MCV: 90 fL (ref 81–101)
MONO#: 0.5 10*3/uL (ref 0.1–0.9)
MONO%: 16.2 % — AB (ref 0.0–13.0)
NEUT%: 48.4 % (ref 39.6–80.0)
NEUTROS ABS: 1.3 10*3/uL — AB (ref 1.5–6.5)
PLATELETS: 187 10*3/uL (ref 145–400)
RBC: 3.14 10*6/uL — AB (ref 3.70–5.32)
RDW: 14.2 % (ref 11.1–15.7)
WBC: 2.8 10*3/uL — AB (ref 3.9–10.0)

## 2015-08-21 LAB — CMP (CANCER CENTER ONLY)
ALT: 28 U/L (ref 10–47)
AST: 32 U/L (ref 11–38)
Albumin: 3.4 g/dL (ref 3.3–5.5)
Alkaline Phosphatase: 50 U/L (ref 26–84)
BILIRUBIN TOTAL: 0.7 mg/dL (ref 0.20–1.60)
BUN: 17 mg/dL (ref 7–22)
CO2: 30 mEq/L (ref 18–33)
CREATININE: 1.1 mg/dL (ref 0.6–1.2)
Calcium: 9.7 mg/dL (ref 8.0–10.3)
Chloride: 101 mEq/L (ref 98–108)
Glucose, Bld: 77 mg/dL (ref 73–118)
Potassium: 3.8 mEq/L (ref 3.3–4.7)
SODIUM: 143 meq/L (ref 128–145)
TOTAL PROTEIN: 7.8 g/dL (ref 6.4–8.1)

## 2015-08-21 MED ORDER — BORTEZOMIB CHEMO SQ INJECTION 3.5 MG (2.5MG/ML)
1.1700 mg/m2 | Freq: Once | INTRAMUSCULAR | Status: AC
  Administered 2015-08-21: 2.25 mg via SUBCUTANEOUS
  Filled 2015-08-21: qty 2.25

## 2015-08-21 MED ORDER — ONDANSETRON HCL 8 MG PO TABS
ORAL_TABLET | ORAL | Status: AC
  Filled 2015-08-21: qty 1

## 2015-08-21 MED ORDER — VITAMIN B-6 250 MG PO TABS: 250.0000 mg | ORAL_TABLET | Freq: Every day | ORAL | Status: DC

## 2015-08-21 NOTE — Progress Notes (Signed)
Hematology and Oncology Follow Up Visit  [name] [id number] [birth year] 60 y.o. 08/21/2015   Principle Diagnosis:  IgG kappa myeloma   Current Therapy:     Velcade/Revlimid/ -Velcade every 3 week dosing  Zometa 4 mg IV every month     Interim History:  Ms.  Barbara Cook is back for a follow-up. Barbara Cook is having a little bit of a tough time. Her back is hurting a little bit more. This is the mid back. Barbara Cook did have kyphoplasty in this area previously.  Barbara Cook also is complaining of some pain in the base of her neck, mostly on the left side. This is present for the past 2 or 3 days. Barbara Cook's not noted any swelling. There is no swallowing difficulties. Barbara Cook's had no hoarseness. Barbara Cook's had no dysphagia.  There's not been any issues with cough. Barbara Cook's had no rashes.  Barbara Cook has had irritable bowel syndrome. The Velcade definitely bothers this. Barbara Cook has quite a bit of diarrhea. I told her to try some Kaopectate.  Barbara Cook has some tingling in her feet. This may be from treatment. I will call in some vitamin B6.  Her myeloma studies haven't holding pretty steady. Her last M spike was 1.1 g/L. Her IgG level was 2259 mg/dL. Her Kappa Lightchain was 6.3 mg/dL.  Overall,, her performance status is ECOG 1  Medications:  Current outpatient prescriptions:  .  acetaminophen (TYLENOL) 500 MG tablet, Take 500 mg by mouth every 6 (six) hours as needed for mild pain or headache., Disp: , Rfl:  .  aspirin 325 MG tablet, Take 325 mg by mouth daily., Disp: , Rfl:  .  bortezomib IV (VELCADE) 3.5 MG injection, Inject 1.3 mg/m2 into the vein once., Disp: , Rfl:  .  cephALEXin (KEFLEX) 500 MG capsule, , Disp: , Rfl:  .  docusate sodium (COLACE) 100 MG capsule, Take 1 capsule (100 mg total) by mouth 2 (two) times daily as needed for mild constipation., Disp: 60 capsule, Rfl: 3 .  ergocalciferol (VITAMIN D2) 50000 UNITS capsule, Take 1 capsule (50,000 Units total) by mouth once a week., Disp: 12 capsule, Rfl: 3 .  famotidine  (PEPCID) 40 MG tablet, Take 1 tablet (40 mg total) by mouth 2 (two) times daily., Disp: 30 tablet, Rfl: 6 .  KLOR-CON 8 MEQ tablet, 1 TABLET ONCE A DAY ORALLY 30 DAY(S), Disp: , Rfl: 6 .  lenalidomide (REVLIMID) 20 MG capsule, TAKE 1 CAPSULE BY MOUTH DAILY FOR 21 DAYS, THEN OFF 7 DAYS. [id number], Disp: 21 capsule, Rfl: 0 .  magnesium oxide (MAG-OX) 400 (241.3 MG) MG tablet, Take 1 tablet (400 mg total) by mouth 2 (two) times daily., Disp: 60 tablet, Rfl: 3 .  Multiple Vitamin (MULTIVITAMIN) tablet, Take 1 tablet by mouth every evening. , Disp: , Rfl:  .  ondansetron (ZOFRAN) 8 MG tablet, Take 1 tablet (8 mg total) by mouth 2 (two) times daily. For nausea & vomiting. Take 1 tablet 1 hour prior to chemotherapy., Disp: 20 tablet, Rfl: 0 .  polyethylene glycol powder (GLYCOLAX/MIRALAX) powder, 1 capful daily as needed, Disp: 255 g, Rfl: 3 .  ranitidine (ZANTAC) 150 MG tablet, Take 150 mg by mouth 2 (two) times daily., Disp: , Rfl:  .  spironolactone (ALDACTONE) 50 MG tablet, Take 50 mg by mouth daily., Disp: , Rfl: 4 .  torsemide (DEMADEX) 20 MG tablet, Take 1 tablet (20 mg total) by mouth daily as needed., Disp: 30 tablet, Rfl: 6  Allergies:  Allergies  Allergen Reactions  .  Codeine Palpitations    Past Medical History, Surgical history, Social history, and Family History were reviewed and updated.  Review of Systems: As above  Physical Exam:  height is 5\' 3"  (1.6 m) and weight is 161 lb (73.029 kg). Her oral temperature is 97.5 F (36.4 C). Her blood pressure is 117/62 and her pulse is 81. Her respiration is 16.   Well-developed and well-nourished African American female. Head and neck exam shows no ocular or oral lesions. There are no palpable cervical or supraclavicular lymph nodes.There is some tenderness to palpation just to the left of the midline at the base of her neck. I cannot palpate any lymph nodes. I cannot palpate her thyroid. Barbara Cook has a good carotid pulse. Lungs are clear.  Cardiac exam regular rate and rhythm with no murmurs, rubs or bruits. Abdomen is soft. Barbara Cook is mildly obese. Barbara Cook really has no abdominal distention. Barbara Cook has no palpable liver or spleen tip. Back exam shows no tenderness over the spine, ribs or hips. Extremity shows no clubbing, cyanosis or edema. Skin exam shows no rashes, ecchymoses or petechia. Neurological exam is nonfocal.  Lab Results  Component Value Date   WBC 2.8* 08/21/2015   HGB 9.5* 08/21/2015   HCT 28.3* 08/21/2015   MCV 90 08/21/2015   PLT 187 08/21/2015     Chemistry      Component Value Date/Time   NA 143 08/21/2015 0745   NA 138 03/17/2015 1113   NA 138 02/04/2015 0826   K 3.8 08/21/2015 0745   K 4.2 03/17/2015 1113   K 3.9 02/04/2015 0826   CL 101 08/21/2015 0745   CL 100 02/04/2015 0826   CO2 30 08/21/2015 0745   CO2 27 03/17/2015 1113   CO2 28 02/04/2015 0826   BUN 17 08/21/2015 0745   BUN 19.8 03/17/2015 1113   BUN 19 02/04/2015 0826   CREATININE 1.1 08/21/2015 0745   CREATININE 1.1 03/17/2015 1113   CREATININE 1.07* 02/04/2015 0826      Component Value Date/Time   CALCIUM 9.7 08/21/2015 0745   CALCIUM 10.1 03/17/2015 1113   CALCIUM 9.6 02/04/2015 0826   ALKPHOS 50 08/21/2015 0745   ALKPHOS 52 03/17/2015 1113   ALKPHOS 47 06/03/2014 0122   AST 32 08/21/2015 0745   AST 23 03/17/2015 1113   AST 40* 06/03/2014 0122   ALT 28 08/21/2015 0745   ALT 17 03/17/2015 1113   ALT 43* 06/03/2014 0122   BILITOT 0.70 08/21/2015 0745   BILITOT 0.49 03/17/2015 1113   BILITOT 0.5 06/03/2014 0122         Impression and Plan: Barbara Cook is 60 year old African-American female with IgG kappa myeloma. Overall, her disease is holding fairly stable. I will not change her regimen at this point in time.  I think Barbara Cook probably needs to have another MRI of her back. We will see if there is any other areas that might be involved that could because be causing her back discomfort.   I'm not sure why Barbara Cook has the neck  discomfort. This might be carotidynia. I think an ultrasound of the neck would be reasonable.   Barbara Cook and her husband have to go to a graduation in [place] the first week in May. We will move her appointment for treatment back by one week.   I spent about 30 minutes with her and her husband today.   We will plan to see her back for her next cycle of treatment which will be probably close  to a month.     [name], MD 4/13/20179:01 AM

## 2015-08-21 NOTE — Patient Instructions (Signed)
[facility] Discharge Instructions for Patients Receiving Chemotherapy  Today you received the following chemotherapy agents Velcade  To help prevent nausea and vomiting after your treatment, we encourage you to take your nausea medication    If you develop nausea and vomiting that is not controlled by your nausea medication, call the clinic.   BELOW ARE SYMPTOMS THAT SHOULD BE REPORTED IMMEDIATELY:  *FEVER GREATER THAN 100.5 F  *CHILLS WITH OR WITHOUT FEVER  NAUSEA AND VOMITING THAT IS NOT CONTROLLED WITH YOUR NAUSEA MEDICATION  *UNUSUAL SHORTNESS OF BREATH  *UNUSUAL BRUISING OR BLEEDING  TENDERNESS IN MOUTH AND THROAT WITH OR WITHOUT PRESENCE OF ULCERS  *URINARY PROBLEMS  *BOWEL PROBLEMS  UNUSUAL RASH Items with * indicate a potential emergency and should be followed up as soon as possible.  Feel free to call the clinic you have any questions or concerns. The clinic phone number is [phone].  Please show the CHEMO ALERT CARD at check-in to the Emergency Department and triage nurse.   

## 2015-08-23 ENCOUNTER — Ambulatory Visit (HOSPITAL_BASED_OUTPATIENT_CLINIC_OR_DEPARTMENT_OTHER): Payer: Commercial Managed Care - HMO

## 2015-08-30 ENCOUNTER — Other Ambulatory Visit (HOSPITAL_BASED_OUTPATIENT_CLINIC_OR_DEPARTMENT_OTHER): Payer: Commercial Managed Care - HMO

## 2015-08-30 ENCOUNTER — Ambulatory Visit (HOSPITAL_BASED_OUTPATIENT_CLINIC_OR_DEPARTMENT_OTHER): Payer: Commercial Managed Care - HMO

## 2015-09-02 ENCOUNTER — Ambulatory Visit: Payer: Commercial Managed Care - HMO

## 2015-09-02 ENCOUNTER — Telehealth: Payer: Self-pay | Admitting: Hematology & Oncology

## 2015-09-02 NOTE — Telephone Encounter (Signed)
Patient called and cx 09/02/15 apt and stated she will call back to resch after she look at her calendar

## 2015-09-04 ENCOUNTER — Telehealth: Payer: Self-pay | Admitting: Gastroenterology

## 2015-09-04 NOTE — Telephone Encounter (Signed)
Patient is calling, stating she is having stomach issues.  She describes it as "stomach blowing up".

## 2015-09-05 ENCOUNTER — Other Ambulatory Visit: Payer: Self-pay | Admitting: Hematology & Oncology

## 2015-09-08 ENCOUNTER — Other Ambulatory Visit: Payer: Commercial Managed Care - HMO

## 2015-09-08 ENCOUNTER — Ambulatory Visit: Payer: Commercial Managed Care - HMO

## 2015-09-08 ENCOUNTER — Ambulatory Visit: Payer: Commercial Managed Care - HMO | Admitting: Hematology & Oncology

## 2015-09-11 ENCOUNTER — Ambulatory Visit: Payer: Commercial Managed Care - HMO

## 2015-09-11 ENCOUNTER — Ambulatory Visit: Payer: Commercial Managed Care - HMO | Admitting: Hematology & Oncology

## 2015-09-11 ENCOUNTER — Other Ambulatory Visit: Payer: Commercial Managed Care - HMO

## 2015-09-18 ENCOUNTER — Other Ambulatory Visit: Payer: Commercial Managed Care - HMO

## 2015-09-18 ENCOUNTER — Ambulatory Visit: Payer: Commercial Managed Care - HMO

## 2015-09-18 ENCOUNTER — Ambulatory Visit: Payer: Commercial Managed Care - HMO | Admitting: Hematology & Oncology

## 2015-09-25 ENCOUNTER — Other Ambulatory Visit (HOSPITAL_BASED_OUTPATIENT_CLINIC_OR_DEPARTMENT_OTHER): Payer: Commercial Managed Care - HMO

## 2015-09-25 ENCOUNTER — Ambulatory Visit (HOSPITAL_BASED_OUTPATIENT_CLINIC_OR_DEPARTMENT_OTHER): Payer: Commercial Managed Care - HMO

## 2015-09-25 ENCOUNTER — Encounter: Payer: Self-pay | Admitting: Hematology & Oncology

## 2015-09-25 ENCOUNTER — Ambulatory Visit (HOSPITAL_BASED_OUTPATIENT_CLINIC_OR_DEPARTMENT_OTHER): Payer: Commercial Managed Care - HMO | Admitting: Hematology & Oncology

## 2015-09-25 VITALS — BP 104/61 | HR 77 | Temp 97.2°F | Resp 16 | Ht 63.0 in | Wt 154.0 lb

## 2015-09-25 DIAGNOSIS — Z5112 Encounter for antineoplastic immunotherapy: Secondary | ICD-10-CM

## 2015-09-25 DIAGNOSIS — M546 Pain in thoracic spine: Secondary | ICD-10-CM

## 2015-09-25 DIAGNOSIS — C9000 Multiple myeloma not having achieved remission: Secondary | ICD-10-CM

## 2015-09-25 DIAGNOSIS — C9002 Multiple myeloma in relapse: Secondary | ICD-10-CM

## 2015-09-25 DIAGNOSIS — M542 Cervicalgia: Secondary | ICD-10-CM

## 2015-09-25 DIAGNOSIS — D508 Other iron deficiency anemias: Secondary | ICD-10-CM

## 2015-09-25 LAB — CMP (CANCER CENTER ONLY)
ALBUMIN: 3.6 g/dL (ref 3.3–5.5)
ALT(SGPT): 31 U/L (ref 10–47)
AST: 30 U/L (ref 11–38)
Alkaline Phosphatase: 57 U/L (ref 26–84)
BUN, Bld: 24 mg/dL — ABNORMAL HIGH (ref 7–22)
CALCIUM: 9.8 mg/dL (ref 8.0–10.3)
CHLORIDE: 104 meq/L (ref 98–108)
CO2: 27 meq/L (ref 18–33)
CREATININE: 1 mg/dL (ref 0.6–1.2)
Glucose, Bld: 80 mg/dL (ref 73–118)
POTASSIUM: 3.5 meq/L (ref 3.3–4.7)
Sodium: 139 mEq/L (ref 128–145)
Total Bilirubin: 0.7 mg/dl (ref 0.20–1.60)
Total Protein: 8.2 g/dL — ABNORMAL HIGH (ref 6.4–8.1)

## 2015-09-25 LAB — CBC WITH DIFFERENTIAL (CANCER CENTER ONLY)
BASO#: 0 10*3/uL (ref 0.0–0.2)
BASO%: 1.2 % (ref 0.0–2.0)
EOS ABS: 0.1 10*3/uL (ref 0.0–0.5)
EOS%: 2 % (ref 0.0–7.0)
HCT: 28.8 % — ABNORMAL LOW (ref 34.8–46.6)
HGB: 9.8 g/dL — ABNORMAL LOW (ref 11.6–15.9)
LYMPH#: 0.9 10*3/uL (ref 0.9–3.3)
LYMPH%: 34.4 % (ref 14.0–48.0)
MCH: 30.4 pg (ref 26.0–34.0)
MCHC: 34 g/dL (ref 32.0–36.0)
MCV: 89 fL (ref 81–101)
MONO#: 0.3 10*3/uL (ref 0.1–0.9)
MONO%: 12 % (ref 0.0–13.0)
NEUT#: 1.3 10*3/uL — ABNORMAL LOW (ref 1.5–6.5)
NEUT%: 50.4 % (ref 39.6–80.0)
PLATELETS: 190 10*3/uL (ref 145–400)
RBC: 3.22 10*6/uL — ABNORMAL LOW (ref 3.70–5.32)
RDW: 14.9 % (ref 11.1–15.7)
WBC: 2.5 10*3/uL — ABNORMAL LOW (ref 3.9–10.0)

## 2015-09-25 LAB — IRON AND TIBC
%SAT: 20 % — AB (ref 21–57)
IRON: 62 ug/dL (ref 41–142)
TIBC: 305 ug/dL (ref 236–444)
UIBC: 243 ug/dL (ref 120–384)

## 2015-09-25 LAB — LACTATE DEHYDROGENASE: LDH: 187 U/L (ref 125–245)

## 2015-09-25 LAB — FERRITIN: FERRITIN: 162 ng/mL (ref 9–269)

## 2015-09-25 MED ORDER — BORTEZOMIB CHEMO SQ INJECTION 3.5 MG (2.5MG/ML)
1.1700 mg/m2 | Freq: Once | INTRAMUSCULAR | Status: AC
  Administered 2015-09-25: 2.25 mg via SUBCUTANEOUS
  Filled 2015-09-25: qty 2.25

## 2015-09-25 MED ORDER — ONDANSETRON HCL 8 MG PO TABS: 8.0000 mg | ORAL_TABLET | Freq: Once | ORAL | Status: DC

## 2015-09-25 MED ORDER — IXAZOMIB CITRATE 4 MG PO CAPS: ORAL_CAPSULE | ORAL | Status: DC

## 2015-09-25 NOTE — Progress Notes (Signed)
Hematology and Oncology Follow Up Visit  Barbara Cook [id number] 08-22-1955 60 y.o. 09/25/2015   Principle Diagnosis:  IgG kappa myeloma   Current Therapy:     Velcade/Revlimid/ -Velcade every 3 week dosing  Ninlaro/Revlimid - start in 10/2015  Zometa 4 mg IV every 3 month     Interim History:  Barbara Cook is back for a follow-up. She is having a little bit of a tough time.  Her back still is hurting. This is whether she had the kyphoplasty. We may have to consider an MRI of this area.  I think she is in tired of having the Velcade every 3 weeks. I think that we should consider switching over to [facility]. I think maybe doing Ninlaro once a week might actually help her protein levels.   Her last myeloma levels showed an M spike of 1.1 g/L. Her IgG level was 2259 milligrams per deciliter. Her kappa light chain was 6.3 mg/dL. Everything really seems be holding steady.  She has had no fever. She's had no diarrhea. She's had no rashes. She's had some leg swelling but is taking her diuretic.   Overall,, her performance status is ECOG 1  Medications:  Current outpatient prescriptions:  .  acetaminophen (TYLENOL) 500 MG tablet, Take 500 mg by mouth every 6 (six) hours as needed for mild pain or headache., Disp: , Rfl:  .  aspirin 325 MG tablet, Take 325 mg by mouth daily., Disp: , Rfl:  .  docusate sodium (COLACE) 100 MG capsule, Take 1 capsule (100 mg total) by mouth 2 (two) times daily as needed for mild constipation., Disp: 60 capsule, Rfl: 3 .  ergocalciferol (VITAMIN D2) 50000 UNITS capsule, Take 1 capsule (50,000 Units total) by mouth once a week., Disp: 12 capsule, Rfl: 3 .  famotidine (PEPCID) 40 MG tablet, Take 1 tablet (40 mg total) by mouth 2 (two) times daily., Disp: 30 tablet, Rfl: 6 .  KLOR-CON 8 MEQ tablet, 1 TABLET ONCE A DAY ORALLY 30 DAY(S), Disp: , Rfl: 6 .  magnesium oxide (MAG-OX) 400 (241.3 MG) MG tablet, Take 1 tablet (400 mg total) by mouth 2 (two) times  daily., Disp: 60 tablet, Rfl: 3 .  Multiple Vitamin (MULTIVITAMIN) tablet, Take 1 tablet by mouth every evening. , Disp: , Rfl:  .  ondansetron (ZOFRAN) 8 MG tablet, Take 1 tablet (8 mg total) by mouth 2 (two) times daily. For nausea & vomiting. Take 1 tablet 1 hour prior to chemotherapy., Disp: 20 tablet, Rfl: 0 .  polyethylene glycol powder (GLYCOLAX/MIRALAX) powder, 1 capful daily as needed, Disp: 255 g, Rfl: 3 .  Pyridoxine HCl (VITAMIN B-6) 250 MG tablet, Take 1 tablet (250 mg total) by mouth daily., Disp: 30 tablet, Rfl: 6 .  ranitidine (ZANTAC) 150 MG tablet, Take 150 mg by mouth 2 (two) times daily., Disp: , Rfl:  .  REVLIMID 20 MG capsule, TAKE 1 CAPSULE BY MOUTH DAILY FOR 21 DAYS, THEN OFF FOR 7 DAYS., Disp: 21 capsule, Rfl: 4 .  spironolactone (ALDACTONE) 50 MG tablet, Take 50 mg by mouth daily., Disp: , Rfl: 4 .  torsemide (DEMADEX) 20 MG tablet, Take 1 tablet (20 mg total) by mouth daily as needed., Disp: 30 tablet, Rfl: 6 .  ixazomib citrate (NINLARO) 4 MG capsule, Take on an empty stomach. Take once a week for 3 weeks then off 1 week, Disp: 3 capsule, Rfl: 4 No current facility-administered medications for this visit.  Facility-Administered Medications Ordered in Other Visits:  .  ondansetron (ZOFRAN) tablet 8 mg, 8 mg, Oral, Once, [name], MD  Allergies:  Allergies  Allergen Reactions  . Codeine Palpitations    Past Medical History, Surgical history, Social history, and Family History were reviewed and updated.  Review of Systems: As above  Physical Exam:  height is 5\' 3"  (1.6 m) and weight is 154 lb (69.854 kg). Her oral temperature is 97.2 F (36.2 C). Her blood pressure is 104/61 and her pulse is 77. Her respiration is 16.   Well-developed and well-nourished African American female. Head and neck exam shows no ocular or oral lesions. There are no palpable cervical or supraclavicular lymph nodes.There is some tenderness to palpation just to the left of the  midline at the base of her neck. I cannot palpate any lymph nodes. I cannot palpate her thyroid. She has a good carotid pulse. Lungs are clear. Cardiac exam regular rate and rhythm with no murmurs, rubs or bruits. Abdomen is soft. She is mildly obese. She really has no abdominal distention. She has no palpable liver or spleen tip. Back exam shows no tenderness over the spine, ribs or hips. Extremity shows no clubbing, cyanosis or edema. Skin exam shows no rashes, ecchymoses or petechia. Neurological exam is nonfocal.  Lab Results  Component Value Date   WBC 2.5* 09/25/2015   HGB 9.8* 09/25/2015   HCT 28.8* 09/25/2015   MCV 89 09/25/2015   PLT 190 09/25/2015     Chemistry      Component Value Date/Time   NA 139 09/25/2015 0813   NA 138 03/17/2015 1113   NA 138 02/04/2015 0826   K 3.5 09/25/2015 0813   K 4.2 03/17/2015 1113   K 3.9 02/04/2015 0826   CL 104 09/25/2015 0813   CL 100 02/04/2015 0826   CO2 27 09/25/2015 0813   CO2 27 03/17/2015 1113   CO2 28 02/04/2015 0826   BUN 24* 09/25/2015 0813   BUN 19.8 03/17/2015 1113   BUN 19 02/04/2015 0826   CREATININE 1.0 09/25/2015 0813   CREATININE 1.1 03/17/2015 1113   CREATININE 1.07* 02/04/2015 0826      Component Value Date/Time   CALCIUM 9.8 09/25/2015 0813   CALCIUM 10.1 03/17/2015 1113   CALCIUM 9.6 02/04/2015 0826   ALKPHOS 57 09/25/2015 0813   ALKPHOS 52 03/17/2015 1113   ALKPHOS 47 06/03/2014 0122   AST 30 09/25/2015 0813   AST 23 03/17/2015 1113   AST 40* 06/03/2014 0122   ALT 31 09/25/2015 0813   ALT 17 03/17/2015 1113   ALT 43* 06/03/2014 0122   BILITOT 0.70 09/25/2015 0813   BILITOT 0.49 03/17/2015 1113   BILITOT 0.5 06/03/2014 0122         Impression and Plan: Barbara Cook is 60 year old African-American female with IgG kappa myeloma. Overall, her disease is holding fairly stable.  I think we will go ahead and make a switch over to [facility]. I think that this would be easier for her. I think it would allow  her and her husband to be able to travel more.  I talked her about Ninlaro. I told her that she takes it once a week. She understands this. I don't think there'll be much more the way of toxicity.  I gave the prescription to my nurse that she will get all the insurance issues figured out.  She will continue the Revlimid. I think we do not see a good response with the Ninlaro, we may want to switch over to  Pomalidomide.  She will get Zometa next week. I will then plan to have Zometa given once every 3 months.  I spent about 35 minutes with her today.  I will see her back in one month.    [name], MD 5/18/20179:32 AM

## 2015-09-25 NOTE — Patient Instructions (Signed)
[facility] Discharge Instructions for Patients Receiving Chemotherapy  Today you received the following chemotherapy agents Velcade  To help prevent nausea and vomiting after your treatment, we encourage you to take your nausea medication    If you develop nausea and vomiting that is not controlled by your nausea medication, call the clinic.   BELOW ARE SYMPTOMS THAT SHOULD BE REPORTED IMMEDIATELY:  *FEVER GREATER THAN 100.5 F  *CHILLS WITH OR WITHOUT FEVER  NAUSEA AND VOMITING THAT IS NOT CONTROLLED WITH YOUR NAUSEA MEDICATION  *UNUSUAL SHORTNESS OF BREATH  *UNUSUAL BRUISING OR BLEEDING  TENDERNESS IN MOUTH AND THROAT WITH OR WITHOUT PRESENCE OF ULCERS  *URINARY PROBLEMS  *BOWEL PROBLEMS  UNUSUAL RASH Items with * indicate a potential emergency and should be followed up as soon as possible.  Feel free to call the clinic you have any questions or concerns. The clinic phone number is [phone].  Please show the CHEMO ALERT CARD at check-in to the Emergency Department and triage nurse.   

## 2015-09-26 ENCOUNTER — Encounter: Payer: Self-pay | Admitting: *Deleted

## 2015-09-26 LAB — KAPPA/LAMBDA LIGHT CHAINS
Ig Kappa Free Light Chain: 75.29 mg/L — ABNORMAL HIGH (ref 3.30–19.40)
Ig Lambda Free Light Chain: 18.04 mg/L (ref 5.71–26.30)
Kappa/Lambda FluidC Ratio: 4.17 — ABNORMAL HIGH (ref 0.26–1.65)

## 2015-09-26 LAB — IGG, IGA, IGM
IgA, Qn, Serum: 136 mg/dL (ref 87–352)
IgG, Qn, Serum: 2388 mg/dL — ABNORMAL HIGH (ref 700–1600)
IgM, Qn, Serum: 20 mg/dL — ABNORMAL LOW (ref 26–217)

## 2015-09-29 ENCOUNTER — Telehealth: Payer: Self-pay | Admitting: *Deleted

## 2015-09-29 ENCOUNTER — Encounter: Payer: Self-pay | Admitting: Hematology & Oncology

## 2015-09-29 NOTE — Telephone Encounter (Signed)
Patient is to start the new prescription Ninlaro. She received information from the pharmacy stating this medication could cause diarrhea. She doesn't want to start this medication if this is a possible side effect. She states she also has IBS and doesn't want any further GI problems. It was explained to patient that not all patients had diarrhea on this medication, but that it was a potential. She would like to check with Dr Marin Olp and see if there are other options, including any possibility of just taking one pill a month.  Spoke with Dr Marin Olp and he would like patient to try one cycle of Ninlaro to see if patient has any side effects. If she doesn't want to try, or doesn't tolerate, her only option at this time would be to continue with the Velcade.   Gave all information, options and Dr Antonieta Pert suggestions. Patient wants to think about it and will let the office know by the end of the week how she wants to proceed.

## 2015-09-30 LAB — PROTEIN ELECTROPHORESIS, SERUM, WITH REFLEX
A/G Ratio: 0.9 (ref 0.7–1.7)
ALPHA 1: 0.2 g/dL (ref 0.0–0.4)
Albumin: 3.7 g/dL (ref 2.9–4.4)
Alpha 2: 0.6 g/dL (ref 0.4–1.0)
BETA: 1 g/dL (ref 0.7–1.3)
Gamma Globulin: 2.4 g/dL — ABNORMAL HIGH (ref 0.4–1.8)
Globulin, Total: 4.2 g/dL — ABNORMAL HIGH (ref 2.2–3.9)
Interpretation(See Below): 0
M-SPIKE, %: 1.3 g/dL — AB
Total Protein: 7.9 g/dL (ref 6.0–8.5)

## 2015-10-02 ENCOUNTER — Ambulatory Visit: Payer: Commercial Managed Care - HMO | Admitting: Hematology & Oncology

## 2015-10-02 ENCOUNTER — Ambulatory Visit (HOSPITAL_BASED_OUTPATIENT_CLINIC_OR_DEPARTMENT_OTHER): Payer: Commercial Managed Care - HMO

## 2015-10-02 ENCOUNTER — Other Ambulatory Visit: Payer: Commercial Managed Care - HMO

## 2015-10-02 VITALS — BP 104/53 | HR 77 | Temp 98.0°F | Resp 18

## 2015-10-02 DIAGNOSIS — C9002 Multiple myeloma in relapse: Secondary | ICD-10-CM | POA: Diagnosis not present

## 2015-10-02 MED ORDER — ZOLEDRONIC ACID 4 MG/100ML IV SOLN
4.0000 mg | Freq: Once | INTRAVENOUS | Status: AC
  Administered 2015-10-02: 4 mg via INTRAVENOUS
  Filled 2015-10-02: qty 100

## 2015-10-02 NOTE — Patient Instructions (Signed)

## 2015-10-03 ENCOUNTER — Other Ambulatory Visit: Payer: Self-pay | Admitting: Hematology & Oncology

## 2015-10-03 ENCOUNTER — Other Ambulatory Visit: Payer: Self-pay | Admitting: *Deleted

## 2015-10-03 MED ORDER — LENALIDOMIDE 20 MG PO CAPS: ORAL_CAPSULE | ORAL | Status: DC

## 2015-10-09 ENCOUNTER — Ambulatory Visit: Payer: Commercial Managed Care - HMO

## 2015-10-09 ENCOUNTER — Other Ambulatory Visit: Payer: Commercial Managed Care - HMO

## 2015-10-09 ENCOUNTER — Ambulatory Visit: Payer: Commercial Managed Care - HMO | Admitting: Hematology & Oncology

## 2015-10-16 ENCOUNTER — Ambulatory Visit: Payer: Commercial Managed Care - HMO

## 2015-10-16 ENCOUNTER — Other Ambulatory Visit: Payer: Commercial Managed Care - HMO

## 2015-10-16 ENCOUNTER — Ambulatory Visit: Payer: Commercial Managed Care - HMO | Admitting: Hematology & Oncology

## 2015-10-23 ENCOUNTER — Ambulatory Visit: Payer: Commercial Managed Care - HMO

## 2015-10-23 ENCOUNTER — Ambulatory Visit: Payer: Commercial Managed Care - HMO | Admitting: Hematology & Oncology

## 2015-10-23 ENCOUNTER — Other Ambulatory Visit: Payer: Commercial Managed Care - HMO

## 2015-10-30 ENCOUNTER — Other Ambulatory Visit (HOSPITAL_BASED_OUTPATIENT_CLINIC_OR_DEPARTMENT_OTHER): Payer: Commercial Managed Care - HMO

## 2015-10-30 ENCOUNTER — Ambulatory Visit (HOSPITAL_BASED_OUTPATIENT_CLINIC_OR_DEPARTMENT_OTHER): Payer: Commercial Managed Care - HMO | Admitting: Hematology & Oncology

## 2015-10-30 VITALS — BP 99/65 | HR 76 | Temp 98.5°F | Resp 18

## 2015-10-30 DIAGNOSIS — C9000 Multiple myeloma not having achieved remission: Secondary | ICD-10-CM

## 2015-10-30 DIAGNOSIS — C9002 Multiple myeloma in relapse: Secondary | ICD-10-CM

## 2015-10-30 LAB — COMPREHENSIVE METABOLIC PANEL
ALT: 21 U/L (ref 0–55)
ANION GAP: 9 meq/L (ref 3–11)
AST: 21 U/L (ref 5–34)
Albumin: 3.7 g/dL (ref 3.5–5.0)
Alkaline Phosphatase: 52 U/L (ref 40–150)
BUN: 23.1 mg/dL (ref 7.0–26.0)
CHLORIDE: 104 meq/L (ref 98–109)
CO2: 24 mEq/L (ref 22–29)
CREATININE: 1 mg/dL (ref 0.6–1.1)
Calcium: 9.5 mg/dL (ref 8.4–10.4)
EGFR: 67 mL/min/{1.73_m2} — ABNORMAL LOW (ref >90)
Glucose: 79 mg/dl (ref 70–140)
POTASSIUM: 4.1 meq/L (ref 3.5–5.1)
Sodium: 138 mEq/L (ref 136–145)
Total Bilirubin: 0.41 mg/dL (ref 0.20–1.20)
Total Protein: 8.8 g/dL — ABNORMAL HIGH (ref 6.4–8.3)

## 2015-10-30 LAB — CBC WITH DIFFERENTIAL (CANCER CENTER ONLY)
BASO#: 0 10*3/uL (ref 0.0–0.2)
BASO%: 0.4 % (ref 0.0–2.0)
EOS%: 5.4 % (ref 0.0–7.0)
Eosinophils Absolute: 0.1 10*3/uL (ref 0.0–0.5)
HCT: 29.8 % — ABNORMAL LOW (ref 34.8–46.6)
HGB: 9.9 g/dL — ABNORMAL LOW (ref 11.6–15.9)
LYMPH#: 0.9 10*3/uL (ref 0.9–3.3)
LYMPH%: 35.8 % (ref 14.0–48.0)
MCH: 30.3 pg (ref 26.0–34.0)
MCHC: 33.2 g/dL (ref 32.0–36.0)
MCV: 91 fL (ref 81–101)
MONO#: 0.2 10*3/uL (ref 0.1–0.9)
MONO%: 6.6 % (ref 0.0–13.0)
NEUT#: 1.3 10*3/uL — ABNORMAL LOW (ref 1.5–6.5)
NEUT%: 51.8 % (ref 39.6–80.0)
Platelets: 170 10*3/uL (ref 145–400)
RBC: 3.27 10*6/uL — ABNORMAL LOW (ref 3.70–5.32)
RDW: 15.5 % (ref 11.1–15.7)
WBC: 2.6 10*3/uL — ABNORMAL LOW (ref 3.9–10.0)

## 2015-10-30 NOTE — Progress Notes (Signed)
Hematology and Oncology Follow Up Visit  Barbara Cook [id number] 1956/02/19 60 y.o. 10/30/2015   Principle Diagnosis:  IgG kappa myeloma   Current Therapy:     Velcade/Revlimid/ -Velcade every 3 week dosing  Ninlaro/Revlimid - start in 11/03/2015  Zometa 4 mg IV every 3 month - next dose in August     Interim History:  [name] is back for a follow-up. She still has not started the [facility] as she read the instructions. She thought she had to take it the same time every week. I told her that there is a lot of flexibility that we have with taking the Ninlaro. She take it any time during the day as it is on empty stomach. She can even take it on a different day of the week if necessary. This made her feel a lot better. She says that she will started on Monday, June 26.  She's not complaining of as much back pain. She has a kyphoplasty of the thoracic spine.  Her myeloma levels have been holding pretty steady. Her M spike was 1.3 g/L. Her IgG level was 2388 mg/dL. Her Kappa Lightchain was 7.53 mg/dL.  There has been no fever. She has had no bleeding. There has not been any change in bowel or bladder habits.   Overall,, her performance status is ECOG 1  Medications:  Current outpatient prescriptions:  .  acetaminophen (TYLENOL) 500 MG tablet, Take 500 mg by mouth every 6 (six) hours as needed for mild pain or headache., Disp: , Rfl:  .  aspirin 325 MG tablet, Take 325 mg by mouth daily., Disp: , Rfl:  .  docusate sodium (COLACE) 100 MG capsule, Take 1 capsule (100 mg total) by mouth 2 (two) times daily as needed for mild constipation., Disp: 60 capsule, Rfl: 3 .  ergocalciferol (VITAMIN D2) 50000 UNITS capsule, Take 1 capsule (50,000 Units total) by mouth once a week., Disp: 12 capsule, Rfl: 3 .  famotidine (PEPCID) 40 MG tablet, Take 1 tablet (40 mg total) by mouth 2 (two) times daily., Disp: 30 tablet, Rfl: 6 .  ixazomib citrate (NINLARO) 4 MG capsule, Take on an empty stomach.  Take once a week for 3 weeks then off 1 week, Disp: 3 capsule, Rfl: 4 .  KLOR-CON 8 MEQ tablet, 1 TABLET ONCE A DAY ORALLY 30 DAY(S), Disp: , Rfl: 6 .  lenalidomide (REVLIMID) 20 MG capsule, TAKE 1 CAPSULE (20MG ) BY MOUTH ONCE DAILY WITH A GLASS OF WATER AT THESAME TIME FOR 21 DAYS OF A 28 DAY CYCLE. [id number], Disp: 21 capsule, Rfl: 4 .  magnesium oxide (MAG-OX) 400 (241.3 MG) MG tablet, Take 1 tablet (400 mg total) by mouth 2 (two) times daily., Disp: 60 tablet, Rfl: 3 .  Multiple Vitamin (MULTIVITAMIN) tablet, Take 1 tablet by mouth every evening. , Disp: , Rfl:  .  ondansetron (ZOFRAN) 8 MG tablet, Take 1 tablet (8 mg total) by mouth 2 (two) times daily. For nausea & vomiting. Take 1 tablet 1 hour prior to chemotherapy., Disp: 20 tablet, Rfl: 0 .  polyethylene glycol powder (GLYCOLAX/MIRALAX) powder, 1 capful daily as needed, Disp: 255 g, Rfl: 3 .  Pyridoxine HCl (VITAMIN B-6) 250 MG tablet, Take 1 tablet (250 mg total) by mouth daily., Disp: 30 tablet, Rfl: 6 .  ranitidine (ZANTAC) 150 MG tablet, Take 150 mg by mouth 2 (two) times daily., Disp: , Rfl:  .  spironolactone (ALDACTONE) 50 MG tablet, Take 50 mg by mouth daily., Disp: ,  Rfl: 4 .  torsemide (DEMADEX) 20 MG tablet, Take 1 tablet (20 mg total) by mouth daily as needed., Disp: 30 tablet, Rfl: 6  Allergies:  Allergies  Allergen Reactions  . Codeine Palpitations    Past Medical History, Surgical history, Social history, and Family History were reviewed and updated.  Review of Systems: As above  Physical Exam:  oral temperature is 98.5 F (36.9 C). Her blood pressure is 99/65 and her pulse is 76. Her respiration is 18 and oxygen saturation is 100%.   Well-developed and well-nourished African American female. Head and neck exam shows no ocular or oral lesions. There are no palpable cervical or supraclavicular lymph nodes.There is some tenderness to palpation just to the left of the midline at the base of her neck. I cannot  palpate any lymph nodes. I cannot palpate her thyroid. She has a good carotid pulse. Lungs are clear. Cardiac exam regular rate and rhythm with no murmurs, rubs or bruits. Abdomen is soft. She is mildly obese. She really has no abdominal distention. She has no palpable liver or spleen tip. Back exam shows no tenderness over the spine, ribs or hips. Extremity shows no clubbing, cyanosis or edema. Skin exam shows no rashes, ecchymoses or petechia. Neurological exam is nonfocal.  Lab Results  Component Value Date   WBC 2.6* 10/30/2015   HGB 9.9* 10/30/2015   HCT 29.8* 10/30/2015   MCV 91 10/30/2015   PLT 170 10/30/2015     Chemistry      Component Value Date/Time   NA 139 09/25/2015 0813   NA 138 03/17/2015 1113   NA 138 02/04/2015 0826   K 3.5 09/25/2015 0813   K 4.2 03/17/2015 1113   K 3.9 02/04/2015 0826   CL 104 09/25/2015 0813   CL 100 02/04/2015 0826   CO2 27 09/25/2015 0813   CO2 27 03/17/2015 1113   CO2 28 02/04/2015 0826   BUN 24* 09/25/2015 0813   BUN 19.8 03/17/2015 1113   BUN 19 02/04/2015 0826   CREATININE 1.0 09/25/2015 0813   CREATININE 1.1 03/17/2015 1113   CREATININE 1.07* 02/04/2015 0826      Component Value Date/Time   CALCIUM 9.8 09/25/2015 0813   CALCIUM 10.1 03/17/2015 1113   CALCIUM 9.6 02/04/2015 0826   ALKPHOS 57 09/25/2015 0813   ALKPHOS 52 03/17/2015 1113   ALKPHOS 47 06/03/2014 0122   AST 30 09/25/2015 0813   AST 23 03/17/2015 1113   AST 40* 06/03/2014 0122   ALT 31 09/25/2015 0813   ALT 17 03/17/2015 1113   ALT 43* 06/03/2014 0122   BILITOT 0.70 09/25/2015 0813   BILITOT 0.49 03/17/2015 1113   BILITOT 0.5 06/03/2014 0122         Impression and Plan: Barbara Cook is 60 year old African-American female with IgG kappa myeloma. Overall, her disease is holding fairly stable.  I really do think that the Ninlaro will help. I want to make sure that her myeloma stays low. I know that we will not cure this as she will not have a stem cell  transplant.  Her quality of life is a little better right now. This is encouraging.   I think she is happy that she does not have to come back for a subcutaneous injection.   I will like to see her back in one month.   I spent about 25 minutes with she and her husband trying to get her to understand the importance of taking the Ninlaro. Her husband  is in agreement with [place] as to the fact that she really needs to start taking it.     [name], MD 6/22/20178:46 AM

## 2015-10-31 LAB — IGG, IGA, IGM
IGA/IMMUNOGLOBULIN A, SERUM: 138 mg/dL (ref 87–352)
IgM, Qn, Serum: 25 mg/dL — ABNORMAL LOW (ref 26–217)

## 2015-10-31 LAB — KAPPA/LAMBDA LIGHT CHAINS
Ig Kappa Free Light Chain: 100.8 mg/L — ABNORMAL HIGH (ref 3.3–19.4)
Ig Lambda Free Light Chain: 19.8 mg/L (ref 5.7–26.3)
KAPPA/LAMBDA FLC RATIO: 5.09 — AB (ref 0.26–1.65)

## 2015-11-03 ENCOUNTER — Ambulatory Visit: Payer: Commercial Managed Care - HMO

## 2015-11-03 ENCOUNTER — Other Ambulatory Visit: Payer: Self-pay | Admitting: Hematology & Oncology

## 2015-11-04 ENCOUNTER — Other Ambulatory Visit: Payer: Self-pay | Admitting: *Deleted

## 2015-11-04 DIAGNOSIS — C9002 Multiple myeloma in relapse: Secondary | ICD-10-CM

## 2015-11-04 LAB — PROTEIN ELECTROPHORESIS, SERUM, WITH REFLEX
A/G Ratio: 0.9 (ref 0.7–1.7)
ALPHA 1: 0.2 g/dL (ref 0.0–0.4)
Albumin: 3.8 g/dL (ref 2.9–4.4)
Alpha 2: 0.6 g/dL (ref 0.4–1.0)
Beta: 0.9 g/dL (ref 0.7–1.3)
GLOBULIN, TOTAL: 4.4 g/dL — AB (ref 2.2–3.9)
Gamma Globulin: 2.6 g/dL — ABNORMAL HIGH (ref 0.4–1.8)
Interpretation(See Below): 0
M-SPIKE, %: 1.8 g/dL — AB
Total Protein: 8.2 g/dL (ref 6.0–8.5)

## 2015-11-04 MED ORDER — LENALIDOMIDE 20 MG PO CAPS: ORAL_CAPSULE | ORAL | Status: DC

## 2015-11-20 ENCOUNTER — Encounter: Payer: Self-pay | Admitting: Internal Medicine

## 2015-11-20 ENCOUNTER — Ambulatory Visit (INDEPENDENT_AMBULATORY_CARE_PROVIDER_SITE_OTHER): Payer: Commercial Managed Care - HMO | Admitting: Internal Medicine

## 2015-11-20 ENCOUNTER — Ambulatory Visit: Payer: Commercial Managed Care - HMO | Admitting: Internal Medicine

## 2015-11-20 VITALS — BP 100/60 | HR 76 | Ht 63.0 in | Wt 155.4 lb

## 2015-11-20 DIAGNOSIS — R1013 Epigastric pain: Secondary | ICD-10-CM | POA: Diagnosis not present

## 2015-11-20 DIAGNOSIS — Z1211 Encounter for screening for malignant neoplasm of colon: Secondary | ICD-10-CM | POA: Diagnosis not present

## 2015-11-20 DIAGNOSIS — K5900 Constipation, unspecified: Secondary | ICD-10-CM | POA: Diagnosis not present

## 2015-11-20 NOTE — Patient Instructions (Addendum)
You have been scheduled for a colonoscopy. Please follow written instructions given to you at your visit today.  Please pick up your prep supplies at the pharmacy within the next 1-3 days. If you use inhalers (even only as needed), please bring them with you on the day of your procedure. Your physician has requested that you go to [email or web address] and enter the access code given to you at your visit today. This web site gives a general overview about your procedure. However, you should still follow specific instructions given to you by our office regarding your preparation for the procedure.  You may use Zofran (ondansentron) as needed for nausea.  Please purchase the following medications over the counter and take as directed: Miralax 17 grams daily  If you are age 76 or older, your body mass index should be between 23-30. Your Body mass index is 27.53 kg/(m^2). If this is out of the aforementioned range listed, please consider follow up with your Primary Care Provider.  If you are age 42 or younger, your body mass index should be between 19-25. Your Body mass index is 27.53 kg/(m^2). If this is out of the aformentioned range listed, please consider follow up with your Primary Care Provider.

## 2015-11-20 NOTE — Progress Notes (Signed)
Patient ID: Barbara Cook, female   DOB: Jun 07, 1955, 59 y.o.   MRN: [id number] HPI: Barbara Cook is a 60 year old female with past medical history of GERD, IBS who seen in consultation at the request of Dr. Shelia Media to evaluate burning epigastric pain and colorectal cancer screening. She has a history of multiple myeloma followed by Dr. Marin Olp, polymyalgia.  She reports that she has in having a burning and bloating/full feeling in her epigastrium. This has been present for several months. She's been using famotidine 40 mg twice daily and with this she denies heartburn. She reports that her appetite is good and she denies dysphagia. She has noticed a decrease in the taste of food with her Velcade. She has intermittent nausea which also seems to be worse with newly started [facility], though she is only taken 1 dose. Bowel movements tend to be constipated and she has noticed her stools have been larger. She drinks prune juice which helps with bowel movements but also gives her gas. With Velcade she develops cramping lower abdominal discomfort worse if she has not had a bowel movement. Previously she was using laxatives prior to Velcade dosing. She denies blood in her stool or melena.  Previously she developed esophagitis when she received radiation therapy to her spine. She was treated with Carafate and symptoms improved.  Prior colonoscopy 10 years ago with Dr. Deatra Ina for screening was normal. She denies a family history of colorectal cancer.  Past Medical History  Diagnosis Date  . Arthropathy, unspecified, site unspecified   . GERD (gastroesophageal reflux disease)   . Arthritis   . MGUS (monoclonal gammopathy of unknown significance) 21-Sep-202013  . Endometriosis   . Fibroid   . Polymyalgia rheumatica ([place])   . IBS (irritable bowel syndrome)   . Multiple myeloma ([place]) 02/18/2014  . Myelitis ([place]) 2011    of bone- neck  . Family history of adverse reaction to anesthesia     Sister- patient will find  out  . Complication of anesthesia     02/13/14- had biospy in X-Ray- "twilight"  "I felt every thing"  . PONV (postoperative nausea and vomiting)     1990  . Constipation     Past Surgical History  Procedure Laterality Date  . Umbilical hernia repair      age 65  . Tubal ligation    . Neck surgery  2011    Myletis- bone grafting- from her left hip  . Fistula repair surg    . Knee arthroscopy Left   . Colonoscopy    . Radiology with anesthesia N/A 08/23/2014    Procedure: T12  ABLATION       (RADIOLOGY WITH ANESTHESIA);  Surgeon: Luanne Bras, MD;  Location: [facility];  Service: Radiology;  Laterality: N/A;    Outpatient Prescriptions Prior to Visit  Medication Sig Dispense Refill  . acetaminophen (TYLENOL) 500 MG tablet Take 500 mg by mouth every 6 (six) hours as needed for mild pain or headache.    Marland Kitchen aspirin 325 MG tablet Take 325 mg by mouth daily.    . ergocalciferol (VITAMIN D2) 50000 UNITS capsule Take 1 capsule (50,000 Units total) by mouth once a week. 12 capsule 3  . famotidine (PEPCID) 40 MG tablet Take 1 tablet (40 mg total) by mouth 2 (two) times daily. 30 tablet 6  . ixazomib citrate (NINLARO) 4 MG capsule Take on an empty stomach. Take once a week for 3 weeks then off 1 week 3 capsule 4  .  KLOR-CON 8 MEQ tablet 1 TABLET ONCE A DAY ORALLY 30 DAY(S)  6  . lenalidomide (REVLIMID) 20 MG capsule TAKE 1 CAPSULE (20MG) BY MOUTH ONCE DAILY FOR 21 DAYS OF A 28 DAY CYCLE. EQAS#[id number] 21 capsule 0  . magnesium oxide (MAG-OX) 400 (241.3 MG) MG tablet Take 1 tablet (400 mg total) by mouth 2 (two) times daily. 60 tablet 3  . Multiple Vitamin (MULTIVITAMIN) tablet Take 1 tablet by mouth every evening.     . ondansetron (ZOFRAN) 8 MG tablet Take 1 tablet (8 mg total) by mouth 2 (two) times daily. For nausea & vomiting. Take 1 tablet 1 hour prior to chemotherapy. 20 tablet 0  . polyethylene glycol powder (GLYCOLAX/MIRALAX) powder 1 capful daily as needed 255 g 3  . Pyridoxine HCl  (VITAMIN B-6) 250 MG tablet Take 1 tablet (250 mg total) by mouth daily. 30 tablet 6  . ranitidine (ZANTAC) 150 MG tablet Take 150 mg by mouth 2 (two) times daily.    Marland Kitchen spironolactone (ALDACTONE) 50 MG tablet Take 50 mg by mouth daily.  4  . torsemide (DEMADEX) 20 MG tablet Take 1 tablet (20 mg total) by mouth daily as needed. 30 tablet 6  . docusate sodium (COLACE) 100 MG capsule Take 1 capsule (100 mg total) by mouth 2 (two) times daily as needed for mild constipation. 60 capsule 3   No facility-administered medications prior to visit.    Allergies  Allergen Reactions  . Codeine Palpitations    Family History  Problem Relation Age of Onset  . Lung cancer Maternal Uncle   . Throat cancer Maternal Aunt   . Diabetes Maternal Grandmother   . Hypertension Maternal Grandmother   . Heart disease Maternal Grandmother   . Heart disease Paternal Grandmother   . Colon cancer Neg Hx   . Heart disease Mother   . Rheum arthritis Mother     Social History  Substance Use Topics  . Smoking status: Former Smoker -- 25.00 packs/day for 5 years    Types: Cigarettes    Start date: 05/26/1988    Quit date: 01/11/1994  . Smokeless tobacco: Never Used     Comment: QUIT SMOKING 20 YEARS AGO  . Alcohol Use: No    ROS: As per history of present illness, otherwise negative  BP 100/60 mmHg  Pulse 76  Ht 5' 3"  (1.6 m)  Wt 155 lb 6 oz (70.478 kg)  BMI 27.53 kg/m2 Constitutional: Well-developed and well-nourished. No distress. HEENT: Normocephalic and atraumatic. Oropharynx is clear and moist. No oropharyngeal exudate. Conjunctivae are normal.  No scleral icterus. Neck: Neck supple. Trachea midline. Well-healed anterior neck incision Cardiovascular: Normal rate, regular rhythm and intact distal pulses. No M/R/G Pulmonary/chest: Effort normal and breath sounds normal. No wheezing, rales or rhonchi. Abdominal: Soft, epigastric tenderness without rebound or guarding, nondistended. Bowel sounds  active throughout. There are no masses palpable. No hepatosplenomegaly. Extremities: no clubbing, cyanosis, or edema Neurological: Alert and oriented to person place and time. Skin: Skin is warm and dry. No rashes noted. Psychiatric: Normal mood and affect. Behavior is normal.  RELEVANT LABS AND IMAGING: CBC    Component Value Date/Time   WBC 2.6* 10/30/2015 0753   WBC 3.9* 08/22/2014 0956   WBC 5.8 10/26/2005 1453   RBC 3.27* 10/30/2015 0753   RBC 3.26* 12/25/2014 0816   RBC 3.34* 08/22/2014 0956   RBC 3.92 10/26/2005 1453   HGB 9.9* 10/30/2015 0753   HGB 9.8* 08/22/2014 0956   HGB 11.5*  10/26/2005 1453   HCT 29.8* 10/30/2015 0753   HCT 30.1* 08/22/2014 0956   HCT 34.8 10/26/2005 1453   PLT 170 10/30/2015 0753   PLT 184 08/22/2014 0956   PLT 177 10/26/2005 1453   MCV 91 10/30/2015 0753   MCV 90.1 08/22/2014 0956   MCV 88.7 10/26/2005 1453   MCH 30.3 10/30/2015 0753   MCH 29.3 08/22/2014 0956   MCH 29.4 10/26/2005 1453   MCHC 33.2 10/30/2015 0753   MCHC 32.6 08/22/2014 0956   MCHC 33.1 10/26/2005 1453   RDW 15.5 10/30/2015 0753   RDW 16.7* 08/22/2014 0956   RDW 13.5 10/26/2005 1453   LYMPHSABS 0.9 10/30/2015 0753   LYMPHSABS 1.3 08/22/2014 0956   LYMPHSABS 2.6 10/26/2005 1453   MONOABS 0.4 08/22/2014 0956   MONOABS 0.5 10/26/2005 1453   EOSABS 0.1 10/30/2015 0753   EOSABS 0.2 08/22/2014 0956   EOSABS 0.2 10/26/2005 1453   BASOSABS 0.0 10/30/2015 0753   BASOSABS 0.0 08/22/2014 0956   BASOSABS 0.0 10/26/2005 1453    CMP     Component Value Date/Time   NA 138 10/30/2015 0754   NA 139 09/25/2015 0813   NA 138 02/04/2015 0826   K 4.1 10/30/2015 0754   K 3.5 09/25/2015 0813   K 3.9 02/04/2015 0826   CL 104 09/25/2015 0813   CL 100 02/04/2015 0826   CO2 24 10/30/2015 0754   CO2 27 09/25/2015 0813   CO2 28 02/04/2015 0826   GLUCOSE 79 10/30/2015 0754   GLUCOSE 80 09/25/2015 0813   GLUCOSE 91 02/04/2015 0826   BUN 23.1 10/30/2015 0754   BUN 24* 09/25/2015  0813   BUN 19 02/04/2015 0826   CREATININE 1.0 10/30/2015 0754   CREATININE 1.0 09/25/2015 0813   CREATININE 1.07* 02/04/2015 0826   CALCIUM 9.5 10/30/2015 0754   CALCIUM 9.8 09/25/2015 0813   CALCIUM 9.6 02/04/2015 0826   PROT 8.2 10/30/2015 0754   PROT 8.8* 10/30/2015 0754   PROT 8.2* 09/25/2015 0813   PROT 8.6* 06/03/2014 0122   ALBUMIN 3.7 10/30/2015 0754   ALBUMIN 3.6 09/25/2015 0813   ALBUMIN 3.6 06/03/2014 0122   AST 21 10/30/2015 0754   AST 30 09/25/2015 0813   AST 40* 06/03/2014 0122   ALT 21 10/30/2015 0754   ALT 31 09/25/2015 0813   ALT 43* 06/03/2014 0122   ALKPHOS 52 10/30/2015 0754   ALKPHOS 57 09/25/2015 0813   ALKPHOS 47 06/03/2014 0122   BILITOT 0.41 10/30/2015 0754   BILITOT 0.70 09/25/2015 0813   BILITOT 0.5 06/03/2014 0122   GFRNONAA 64* 08/22/2014 0956   GFRAA 75* 08/22/2014 0956    ASSESSMENT/PLAN: 60 year old female with past medical history of GERD, IBS who seen in consultation at the request of Dr. Shelia Media to evaluate burning epigastric pain and colorectal cancer screening.  1. Epigastric pain -- may relate to a medication side effect. She's had no dysphagia or change in appetite. I recommended upper endoscopy for further evaluation of her discomfort. We discussed the risks, benefits and alternatives and she wishes to proceed. For now she will continue famotidine 40 mg twice daily. Zofran to be used as needed and as directed for nausea.  2. Mild constipation with abdominal bloating -- I feel that this is likely a constipation related side effect. I recommended MiraLAX 17 g on a daily basis to help increase stool frequency. This may make GI upset less as she takes her multiple myeloma medications.  3. Colorectal cancer screening -- colonoscopy  recommended at this time as last exam was 10 years ago. We discussed the risks, benefits and alternatives and she wishes to proceed. MiraLAX prep as she tolerated other preps poorly in the past.  4. Multiple myeloma  -- followed with Dr. Marin Olp and currently on treatment.    [id number] Pharr, [place] [place], [place] [address]

## 2015-12-02 ENCOUNTER — Telehealth: Payer: Self-pay | Admitting: Internal Medicine

## 2015-12-02 NOTE — Telephone Encounter (Signed)
Spoke with patient and she will increase to up to TID prn. Patient states she will try this.

## 2015-12-04 ENCOUNTER — Ambulatory Visit (HOSPITAL_BASED_OUTPATIENT_CLINIC_OR_DEPARTMENT_OTHER): Payer: Commercial Managed Care - HMO | Admitting: Hematology & Oncology

## 2015-12-04 ENCOUNTER — Other Ambulatory Visit: Payer: Self-pay | Admitting: Nurse Practitioner

## 2015-12-04 ENCOUNTER — Other Ambulatory Visit (HOSPITAL_BASED_OUTPATIENT_CLINIC_OR_DEPARTMENT_OTHER): Payer: Commercial Managed Care - HMO

## 2015-12-04 DIAGNOSIS — C9002 Multiple myeloma in relapse: Secondary | ICD-10-CM | POA: Diagnosis not present

## 2015-12-04 DIAGNOSIS — C9000 Multiple myeloma not having achieved remission: Secondary | ICD-10-CM

## 2015-12-04 LAB — COMPREHENSIVE METABOLIC PANEL
ALBUMIN: 3.8 g/dL (ref 3.5–5.0)
ALK PHOS: 52 U/L (ref 40–150)
ALT: 31 U/L (ref 0–55)
AST: 31 U/L (ref 5–34)
Anion Gap: 8 mEq/L (ref 3–11)
BILIRUBIN TOTAL: 0.5 mg/dL (ref 0.20–1.20)
BUN: 23.6 mg/dL (ref 7.0–26.0)
CO2: 28 meq/L (ref 22–29)
CREATININE: 1.1 mg/dL (ref 0.6–1.1)
Calcium: 9.6 mg/dL (ref 8.4–10.4)
Chloride: 100 mEq/L (ref 98–109)
EGFR: 62 mL/min/{1.73_m2} — AB (ref >90)
GLUCOSE: 86 mg/dL (ref 70–140)
Potassium: 3.8 mEq/L (ref 3.5–5.1)
SODIUM: 137 meq/L (ref 136–145)
TOTAL PROTEIN: 8.9 g/dL — AB (ref 6.4–8.3)

## 2015-12-04 LAB — CBC WITH DIFFERENTIAL (CANCER CENTER ONLY)
BASO#: 0 10*3/uL (ref 0.0–0.2)
BASO%: 0.4 % (ref 0.0–2.0)
EOS%: 2.9 % (ref 0.0–7.0)
Eosinophils Absolute: 0.1 10*3/uL (ref 0.0–0.5)
HEMATOCRIT: 28.9 % — AB (ref 34.8–46.6)
HEMOGLOBIN: 9.9 g/dL — AB (ref 11.6–15.9)
LYMPH#: 1.3 10*3/uL (ref 0.9–3.3)
LYMPH%: 51.4 % — ABNORMAL HIGH (ref 14.0–48.0)
MCH: 30.7 pg (ref 26.0–34.0)
MCHC: 34.3 g/dL (ref 32.0–36.0)
MCV: 90 fL (ref 81–101)
MONO#: 0.4 10*3/uL (ref 0.1–0.9)
MONO%: 14.4 % — ABNORMAL HIGH (ref 0.0–13.0)
NEUT%: 30.9 % — AB (ref 39.6–80.0)
NEUTROS ABS: 0.8 10*3/uL — AB (ref 1.5–6.5)
Platelets: 150 10*3/uL (ref 145–400)
RBC: 3.22 10*6/uL — ABNORMAL LOW (ref 3.70–5.32)
RDW: 13.8 % (ref 11.1–15.7)
WBC: 2.4 10*3/uL — ABNORMAL LOW (ref 3.9–10.0)

## 2015-12-04 LAB — IRON AND TIBC
%SAT: 21 % (ref 21–57)
IRON: 63 ug/dL (ref 41–142)
TIBC: 305 ug/dL (ref 236–444)
UIBC: 242 ug/dL (ref 120–384)

## 2015-12-04 LAB — FERRITIN: Ferritin: 134 ng/ml (ref 9–269)

## 2015-12-04 MED ORDER — IXAZOMIB CITRATE 3 MG PO CAPS: ORAL_CAPSULE | ORAL | 4 refills | Status: DC

## 2015-12-04 MED ORDER — IXAZOMIB CITRATE 3 MG PO CAPS: 3.0000 mg | ORAL_CAPSULE | ORAL | 4 refills | Status: DC

## 2015-12-04 NOTE — Progress Notes (Signed)
Hematology and Oncology Follow Up Visit  Barbara Cook [id number] 1955/08/14 60 y.o. 12/04/2015   Principle Diagnosis:  IgG kappa myeloma   Current Therapy:     Velcade/Revlimid/ -Velcade every 3 week dosing  Ninlaro/Revlimid - start in 11/03/2015  Zometa 4 mg IV every 3 month - next dose in August     Interim History:  Ms.  Cook is back for a follow-up. She finally started the Ninlaro. Unfortunately, she has some intestinal issues with it. We tried to adjust some of her premedication. She hopefully will take an anti-medic with the Ninlaro.  I will adjust her dose down to 3 mg. Over, she will be able to handle this.  She is doing okay with the Revlimid.  She's really had no vomiting. She's had no diarrhea. She takes MiraLAX. We did tell her to try a probiotic.  She has had no fever.   Overall,, her performance status is ECOG 1  Medications:  Current Outpatient Prescriptions:  .  acetaminophen (TYLENOL) 500 MG tablet, Take 500 mg by mouth every 6 (six) hours as needed for mild pain or headache., Disp: , Rfl:  .  aspirin 325 MG tablet, Take 325 mg by mouth daily., Disp: , Rfl:  .  ergocalciferol (VITAMIN D2) 50000 UNITS capsule, Take 1 capsule (50,000 Units total) by mouth once a week., Disp: 12 capsule, Rfl: 3 .  famotidine (PEPCID) 40 MG tablet, Take 1 tablet (40 mg total) by mouth 2 (two) times daily., Disp: 30 tablet, Rfl: 6 .  ixazomib citrate (NINLARO) 3 MG capsule, Take on an empty stomach. Take once a week for 3 weeks then off 1 week, Disp: 3 capsule, Rfl: 4 .  KLOR-CON 8 MEQ tablet, 1 TABLET ONCE A DAY ORALLY 30 DAY(S), Disp: , Rfl: 6 .  lenalidomide (REVLIMID) 20 MG capsule, TAKE 1 CAPSULE (20MG ) BY MOUTH ONCE DAILY FOR 21 DAYS OF A 28 DAY CYCLE. [phone], Disp: 21 capsule, Rfl: 0 .  magnesium oxide (MAG-OX) 400 (241.3 MG) MG tablet, Take 1 tablet (400 mg total) by mouth 2 (two) times daily., Disp: 60 tablet, Rfl: 3 .  Multiple Vitamin (MULTIVITAMIN) tablet,  Take 1 tablet by mouth every evening. , Disp: , Rfl:  .  ondansetron (ZOFRAN) 8 MG tablet, Take 1 tablet (8 mg total) by mouth 2 (two) times daily. For nausea & vomiting. Take 1 tablet 1 hour prior to chemotherapy., Disp: 20 tablet, Rfl: 0 .  polyethylene glycol powder (GLYCOLAX/MIRALAX) powder, 1 capful daily as needed, Disp: 255 g, Rfl: 3 .  Pyridoxine HCl (VITAMIN B-6) 250 MG tablet, Take 1 tablet (250 mg total) by mouth daily., Disp: 30 tablet, Rfl: 6 .  ranitidine (ZANTAC) 150 MG tablet, Take 150 mg by mouth 2 (two) times daily., Disp: , Rfl:  .  spironolactone (ALDACTONE) 50 MG tablet, Take 50 mg by mouth daily., Disp: , Rfl: 4 .  torsemide (DEMADEX) 20 MG tablet, Take 1 tablet (20 mg total) by mouth daily as needed., Disp: 30 tablet, Rfl: 6  Allergies:  Allergies  Allergen Reactions  . Codeine Palpitations    Past Medical History, Surgical history, Social history, and Family History were reviewed and updated.  Review of Systems: As above  Physical Exam:  height is 5\' 3"  (1.6 m) and weight is 155 lb (70.3 kg). Her tympanic temperature is 97.7 F (36.5 C). Her blood pressure is 116/68 and her pulse is 83. Her respiration is 18.   Well-developed and well-nourished African American female.  Head and neck exam shows no ocular or oral lesions. There are no palpable cervical or supraclavicular lymph nodes.There is some tenderness to palpation just to the left of the midline at the base of her neck. I cannot palpate any lymph nodes. I cannot palpate her thyroid. She has a good carotid pulse. Lungs are clear. Cardiac exam regular rate and rhythm with no murmurs, rubs or bruits. Abdomen is soft. She is mildly obese. She really has no abdominal distention. She has no palpable liver or spleen tip. Back exam shows no tenderness over the spine, ribs or hips. Extremity shows no clubbing, cyanosis or edema. Skin exam shows no rashes, ecchymoses or petechia. Neurological exam is nonfocal.  Lab Results    Component Value Date   WBC 2.4 (L) 12/04/2015   HGB 9.9 (L) 12/04/2015   HCT 28.9 (L) 12/04/2015   MCV 90 12/04/2015   PLT 150 12/04/2015     Chemistry      Component Value Date/Time   NA 138 10/30/2015 0754   K 4.1 10/30/2015 0754   CL 104 09/25/2015 0813   CO2 24 10/30/2015 0754   BUN 23.1 10/30/2015 0754   CREATININE 1.0 10/30/2015 0754      Component Value Date/Time   CALCIUM 9.5 10/30/2015 0754   ALKPHOS 52 10/30/2015 0754   AST 21 10/30/2015 0754   ALT 21 10/30/2015 0754   BILITOT 0.41 10/30/2015 0754         Impression and Plan: Barbara Cook is 60 year old African-American female with IgG kappa myeloma. Overall, her disease is holding fairly stable.  I will make a dosage adjustment with a Ninlaro. We will try the 3 mg dose. I think this would be reasonable. Hopefully she will be able to take the 3 week dose.   I know that her last monoclonal spike was up a little bit. Hopefully, if she can stick with the Ninlaro, we will see if come back down.   She does have a very sensitive intestinal system. We have to be cautious with this.   I will plan to get her back in another month. She'll get her Zometa dose moist see her back.     [name], MD 7/27/20178:36 AM

## 2015-12-05 LAB — KAPPA/LAMBDA LIGHT CHAINS
IG KAPPA FREE LIGHT CHAIN: 88 mg/L — AB (ref 3.3–19.4)
Ig Lambda Free Light Chain: 16.7 mg/L (ref 5.7–26.3)
Kappa/Lambda FluidC Ratio: 5.27 — ABNORMAL HIGH (ref 0.26–1.65)

## 2015-12-08 LAB — MULTIPLE MYELOMA PANEL, SERUM
ALBUMIN/GLOB SERPL: 0.9 (ref 0.7–1.7)
ALPHA 1: 0.2 g/dL (ref 0.0–0.4)
Albumin SerPl Elph-Mcnc: 3.7 g/dL (ref 2.9–4.4)
Alpha2 Glob SerPl Elph-Mcnc: 0.7 g/dL (ref 0.4–1.0)
B-Globulin SerPl Elph-Mcnc: 1 g/dL (ref 0.7–1.3)
GAMMA GLOB SERPL ELPH-MCNC: 2.7 g/dL — AB (ref 0.4–1.8)
GLOBULIN, TOTAL: 4.6 g/dL — AB (ref 2.2–3.9)
IGA/IMMUNOGLOBULIN A, SERUM: 135 mg/dL (ref 87–352)
IGM (IMMUNOGLOBIN M), SRM: 27 mg/dL (ref 26–217)
IgG, Qn, Serum: 2997 mg/dL — ABNORMAL HIGH (ref 700–1600)
M Protein SerPl Elph-Mcnc: 1.7 g/dL — ABNORMAL HIGH
Total Protein: 8.3 g/dL (ref 6.0–8.5)

## 2015-12-12 ENCOUNTER — Other Ambulatory Visit: Payer: Self-pay | Admitting: Hematology & Oncology

## 2015-12-12 DIAGNOSIS — C9002 Multiple myeloma in relapse: Secondary | ICD-10-CM

## 2015-12-15 ENCOUNTER — Other Ambulatory Visit: Payer: Self-pay | Admitting: Nurse Practitioner

## 2015-12-15 ENCOUNTER — Encounter: Payer: Self-pay | Admitting: Nurse Practitioner

## 2015-12-15 DIAGNOSIS — C9002 Multiple myeloma in relapse: Secondary | ICD-10-CM

## 2015-12-15 MED ORDER — LENALIDOMIDE 20 MG PO CAPS: ORAL_CAPSULE | ORAL | 0 refills | Status: DC

## 2015-12-15 NOTE — Progress Notes (Signed)
Patient called stating she is in [place], and she went to urgent care because her ear was bleeding and hearing was limited to that L ear. She stated the MD saw some dried blood in the ear, removed a moderate portion of wax, and removed a small size blood. She has not had any further continuous bleeding. She stated they put cotton in her ear and recommended she see an ENT. She advised she was out of town and was told she could follow-up once she returned back to [place]. Pt advised to keep a close eye for further bleeding, headaches, dizziness, or loss of hearing. She also stated they completed a CBC which was all normal. Pt knows to seek medical attention immediately if needed and will follow-up with our office upon arrival back home.

## 2015-12-25 ENCOUNTER — Encounter: Payer: Self-pay | Admitting: Internal Medicine

## 2015-12-25 ENCOUNTER — Ambulatory Visit (AMBULATORY_SURGERY_CENTER): Payer: Commercial Managed Care - HMO | Admitting: Internal Medicine

## 2015-12-25 VITALS — BP 108/61 | HR 81 | Temp 98.4°F | Resp 17 | Ht 63.0 in | Wt 155.0 lb

## 2015-12-25 DIAGNOSIS — K2970 Gastritis, unspecified, without bleeding: Secondary | ICD-10-CM

## 2015-12-25 DIAGNOSIS — Z1211 Encounter for screening for malignant neoplasm of colon: Secondary | ICD-10-CM | POA: Diagnosis present

## 2015-12-25 DIAGNOSIS — R1013 Epigastric pain: Secondary | ICD-10-CM

## 2015-12-25 DIAGNOSIS — K2990 Gastroduodenitis, unspecified, without bleeding: Secondary | ICD-10-CM | POA: Diagnosis not present

## 2015-12-25 MED ORDER — PANTOPRAZOLE SODIUM 40 MG PO TBEC: 40.0000 mg | DELAYED_RELEASE_TABLET | Freq: Every day | ORAL | 3 refills | Status: DC

## 2015-12-25 MED ORDER — SODIUM CHLORIDE 0.9 % IV SOLN: 500.0000 mL | INTRAVENOUS | Status: DC

## 2015-12-25 NOTE — Progress Notes (Signed)
Report to PACU, RN, vss, BBS= Clear.  

## 2015-12-25 NOTE — Op Note (Signed)
[place] Patient Name: Barbara Cook Procedure Date: 12/25/2015 2:52 PM MRN: [id number] Endoscopist: Jerene Bears , MD Age: 60 Referring MD:  Date of Birth: November 16, 1955 Gender: Female Account #: [id number] Procedure:                Upper GI endoscopy Indications:              Epigastric abdominal pain Medicines:                Monitored Anesthesia Care Procedure:                Pre-Anesthesia Assessment:                           - Prior to the procedure, a History and Physical                            was performed, and patient medications and                            allergies were reviewed. The patient's tolerance of                            previous anesthesia was also reviewed. The risks                            and benefits of the procedure and the sedation                            options and risks were discussed with the patient.                            All questions were answered, and informed consent                            was obtained. Prior Anticoagulants: The patient has                            taken no previous anticoagulant or antiplatelet                            agents. ASA Grade Assessment: II - A patient with                            mild systemic disease. After reviewing the risks                            and benefits, the patient was deemed in                            satisfactory condition to undergo the procedure.                           After obtaining informed consent, the endoscope was  passed under direct vision. Throughout the                            procedure, the patient's blood pressure, pulse, and                            oxygen saturations were monitored continuously. The                            Model GIF-HQ190 [id number] scope was introduced                            through the mouth, and advanced to the second part                            of duodenum. The upper GI  endoscopy was                            accomplished without difficulty. The patient                            tolerated the procedure well. Scope In: Scope Out: Findings:                 The examined esophagus was normal. Z-line regular                            at 40 cm.                           Patchy moderate inflammation characterized by                            erosions, erythema and granularity was found in the                            gastric body and in the gastric antrum. Biopsies                            were taken with a cold forceps for histology and                            Helicobacter pylori testing.                           The cardia and gastric fundus were normal on                            retroflexion.                           The examined duodenum was normal. Complications:            No immediate complications. Estimated Blood Loss:     Estimated blood loss was minimal. Impression:               -  Normal esophagus.                           - Gastritis. Biopsied.                           - Normal examined duodenum. Recommendation:           - Patient has a contact number available for                            emergencies. The signs and symptoms of potential                            delayed complications were discussed with the                            patient. Return to normal activities tomorrow.                            Written discharge instructions were provided to the                            patient.                           - Resume previous diet.                           - Continue present medications.                           - Await pathology results.                           - Avoid ibuprofen, naproxen, or other non-steroidal                            anti-inflammatory drugs.                           - Trial of Protonix (pantoprazole) 40 mg PO daily                            in place on ranitidine/famotidine given  gastritis                            seen today.                           - See the other procedure note for documentation of                            additional recommendations. Jerene Bears, MD 12/25/2015 3:38:12 PM This report has been signed electronically. CC Letter to:             Rudell Cobb. Marin Olp, MD

## 2015-12-25 NOTE — Op Note (Signed)
[place] Patient Name: Barbara Cook Procedure Date: 12/25/2015 2:51 PM MRN: [id number] Endoscopist: Jerene Bears , MD Age: 60 Referring MD:  Date of Birth: [birth year] Gender: Female Account #: [id number] Procedure:                Colonoscopy Indications:              Screening for colorectal malignant neoplasm, Last                            colonoscopy 10 years ago Medicines:                Monitored Anesthesia Care Procedure:                Pre-Anesthesia Assessment:                           - Prior to the procedure, a History and Physical                            was performed, and patient medications and                            allergies were reviewed. The patient's tolerance of                            previous anesthesia was also reviewed. The risks                            and benefits of the procedure and the sedation                            options and risks were discussed with the patient.                            All questions were answered, and informed consent                            was obtained. Prior Anticoagulants: The patient has                            taken no previous anticoagulant or antiplatelet                            agents. ASA Grade Assessment: II - A patient with                            mild systemic disease. After reviewing the risks                            and benefits, the patient was deemed in                            satisfactory condition to undergo the procedure.  After obtaining informed consent, the colonoscope                            was passed under direct vision. Throughout the                            procedure, the patient's blood pressure, pulse, and                            oxygen saturations were monitored continuously. The                            Model PCF-[id number]) scope was introduced                            through the anus and advanced to the  the cecum,                            identified by appendiceal orifice and ileocecal                            valve. The colonoscopy was performed without                            difficulty. The patient tolerated the procedure                            well. The quality of the bowel preparation was                            good. The ileocecal valve, appendiceal orifice, and                            rectum were photographed. Scope In: 3:14:26 PM Scope Out: 3:31:50 PM Scope Withdrawal Time: 0 hours 10 minutes 33 seconds  Total Procedure Duration: 0 hours 17 minutes 24 seconds  Findings:                 The digital rectal exam was normal.                           The entire examined colon appeared normal.                           Retroflexion in the rectum was not performed due to                            narrowed rectal vault. The dentate line was                            visualized without abnormality in the distal rectum. Complications:            No immediate complications. Estimated Blood Loss:     Estimated blood loss: none. Impression:               -  The entire examined colon is normal.                           - No specimens collected. Recommendation:           - Patient has a contact number available for                            emergencies. The signs and symptoms of potential                            delayed complications were discussed with the                            patient. Return to normal activities tomorrow.                            Written discharge instructions were provided to the                            patient.                           - Resume previous diet.                           - Continue present medications.                           - Repeat colonoscopy in 10 years for screening                            purposes. Jerene Bears, MD 12/25/2015 3:40:59 PM This report has been signed electronically. CC Letter to:              Rudell Cobb. Marin Olp, MD

## 2015-12-25 NOTE — Patient Instructions (Signed)
YOU HAD AN ENDOSCOPIC PROCEDURE TODAY AT [facility]:   Refer to the procedure report that was given to you for any specific questions about what was found during the examination.  If the procedure report does not answer your questions, please call your gastroenterologist to clarify.  If you requested that your care partner not be given the details of your procedure findings, then the procedure report has been included in a sealed envelope for you to review at your convenience later.  YOU SHOULD EXPECT: Some feelings of bloating in the abdomen. Passage of more gas than usual.  Walking can help get rid of the air that was put into your GI tract during the procedure and reduce the bloating. If you had a lower endoscopy (such as a colonoscopy or flexible sigmoidoscopy) you may notice spotting of blood in your stool or on the toilet paper. If you underwent a bowel prep for your procedure, you may not have a normal bowel movement for a few days.  Please Note:  You might notice some irritation and congestion in your nose or some drainage.  This is from the oxygen used during your procedure.  There is no need for concern and it should clear up in a day or so.  SYMPTOMS TO REPORT IMMEDIATELY:   Following lower endoscopy (colonoscopy or flexible sigmoidoscopy):  Excessive amounts of blood in the stool  Significant tenderness or worsening of abdominal pains  Swelling of the abdomen that is new, acute  Fever of 100F or higher   Following upper endoscopy (EGD)  Vomiting of blood or coffee ground material  New chest pain or pain under the shoulder blades  Painful or persistently difficult swallowing  New shortness of breath  Fever of 100F or higher  Black, tarry-looking stools  For urgent or emergent issues, a gastroenterologist can be reached at any hour by calling [phone].   DIET:  We do recommend a small meal at first, but then you may proceed to your regular diet.  Drink  plenty of fluids but you should avoid alcoholic beverages for 24 hours.  ACTIVITY:  You should plan to take it easy for the rest of today and you should NOT DRIVE or use heavy machinery until tomorrow (because of the sedation medicines used during the test).    FOLLOW UP: Our staff will call the number listed on your records the next business day following your procedure to check on you and address any questions or concerns that you may have regarding the information given to you following your procedure. If we do not reach you, we will leave a message.  However, if you are feeling well and you are not experiencing any problems, there is no need to return our call.  We will assume that you have returned to your regular daily activities without incident.  If any biopsies were taken you will be contacted by phone or by letter within the next 1-3 weeks.  Please call us at [phone] if you have not heard about the biopsies in 3 weeks.    SIGNATURES/CONFIDENTIALITY: You and/or your care partner have signed paperwork which will be entered into your electronic medical record.  These signatures attest to the fact that that the information above on your After Visit Summary has been reviewed and is understood.  Full responsibility of the confidentiality of this discharge information lies with you and/or your care-partner.   Information on gastritis given to you today  Await biopsy  results   Trial of Protonix 40 mg daily in place of Ranitidine and Famotidine   AVOID IBUPROFEN ,ALEVE,NAPROXEN OR ANY NON STEROIDAL ANTI INFLAMMATORY PRODUCTS .

## 2015-12-25 NOTE — Progress Notes (Signed)
Called to room to assist during endoscopic procedure.  Patient ID and intended procedure confirmed with present staff. Received instructions for my participation in the procedure from the performing physician.  

## 2015-12-26 ENCOUNTER — Encounter: Payer: Self-pay | Admitting: Nurse Practitioner

## 2015-12-26 ENCOUNTER — Telehealth: Payer: Self-pay

## 2015-12-26 NOTE — Progress Notes (Signed)
Patient called and stated she had a colonoscopy and GI provider informed her she had some erythema, stomach lining irritation, and swelling. He did do a biospy of a few areas and stated he would have results in about 5-7 days. Dr. Marin Olp is aware and has advised patient to now began back on her Ninlaro 3 mg as previously ordered. She verbalized understanding and appreciation.

## 2015-12-26 NOTE — Telephone Encounter (Signed)
  Follow up Call-  Call back number 12/25/2015  Post procedure Call Back phone  # [phone]  Permission to leave phone message Yes  Some recent data might be hidden     Patient questions:  Do you have a fever, pain , or abdominal swelling? No. Pain Score  0 *  Have you tolerated food without any problems? Yes.    Have you been able to return to your normal activities? Yes.    Do you have any questions about your discharge instructions: Diet   No. Medications  No. Follow up visit  No.  Do you have questions or concerns about your Care? No.

## 2015-12-31 ENCOUNTER — Encounter: Payer: Self-pay | Admitting: Internal Medicine

## 2015-12-31 DIAGNOSIS — J302 Other seasonal allergic rhinitis: Secondary | ICD-10-CM | POA: Insufficient documentation

## 2015-12-31 DIAGNOSIS — H9222 Otorrhagia, left ear: Secondary | ICD-10-CM | POA: Insufficient documentation

## 2015-12-31 DIAGNOSIS — J342 Deviated nasal septum: Secondary | ICD-10-CM | POA: Insufficient documentation

## 2015-12-31 DIAGNOSIS — R040 Epistaxis: Secondary | ICD-10-CM | POA: Insufficient documentation

## 2016-01-08 ENCOUNTER — Other Ambulatory Visit (HOSPITAL_BASED_OUTPATIENT_CLINIC_OR_DEPARTMENT_OTHER): Payer: Commercial Managed Care - HMO

## 2016-01-08 ENCOUNTER — Ambulatory Visit (HOSPITAL_BASED_OUTPATIENT_CLINIC_OR_DEPARTMENT_OTHER): Payer: Commercial Managed Care - HMO | Admitting: Hematology & Oncology

## 2016-01-08 ENCOUNTER — Encounter: Payer: Self-pay | Admitting: Hematology & Oncology

## 2016-01-08 VITALS — BP 111/63 | HR 75 | Temp 97.5°F | Resp 16 | Ht 63.0 in | Wt 152.0 lb

## 2016-01-08 DIAGNOSIS — D649 Anemia, unspecified: Secondary | ICD-10-CM | POA: Diagnosis not present

## 2016-01-08 DIAGNOSIS — K2100 Gastro-esophageal reflux disease with esophagitis, without bleeding: Secondary | ICD-10-CM

## 2016-01-08 DIAGNOSIS — C9000 Multiple myeloma not having achieved remission: Secondary | ICD-10-CM

## 2016-01-08 DIAGNOSIS — D72819 Decreased white blood cell count, unspecified: Secondary | ICD-10-CM

## 2016-01-08 DIAGNOSIS — C9002 Multiple myeloma in relapse: Secondary | ICD-10-CM

## 2016-01-08 LAB — CBC WITH DIFFERENTIAL (CANCER CENTER ONLY)
BASO#: 0 10*3/uL (ref 0.0–0.2)
BASO%: 1 % (ref 0.0–2.0)
EOS%: 2.5 % (ref 0.0–7.0)
Eosinophils Absolute: 0.1 10*3/uL (ref 0.0–0.5)
HCT: 27.9 % — ABNORMAL LOW (ref 34.8–46.6)
HGB: 9.5 g/dL — ABNORMAL LOW (ref 11.6–15.9)
LYMPH#: 0.8 10*3/uL — ABNORMAL LOW (ref 0.9–3.3)
LYMPH%: 39.9 % (ref 14.0–48.0)
MCH: 30.6 pg (ref 26.0–34.0)
MCHC: 34.1 g/dL (ref 32.0–36.0)
MCV: 90 fL (ref 81–101)
MONO#: 0.2 10*3/uL (ref 0.1–0.9)
MONO%: 9.1 % (ref 0.0–13.0)
NEUT#: 0.9 10*3/uL — ABNORMAL LOW (ref 1.5–6.5)
NEUT%: 47.5 % (ref 39.6–80.0)
PLATELETS: 172 10*3/uL (ref 145–400)
RBC: 3.1 10*6/uL — ABNORMAL LOW (ref 3.70–5.32)
RDW: 14.3 % (ref 11.1–15.7)
WBC: 2 10*3/uL — ABNORMAL LOW (ref 3.9–10.0)

## 2016-01-08 LAB — CMP (CANCER CENTER ONLY)
ALT: 29 U/L (ref 10–47)
AST: 26 U/L (ref 11–38)
Albumin: 3.7 g/dL (ref 3.3–5.5)
Alkaline Phosphatase: 48 U/L (ref 26–84)
BUN: 18 mg/dL (ref 7–22)
CALCIUM: 9.3 mg/dL (ref 8.0–10.3)
CHLORIDE: 106 meq/L (ref 98–108)
CO2: 30 meq/L (ref 18–33)
Creat: 1.3 mg/dl — ABNORMAL HIGH (ref 0.6–1.2)
GLUCOSE: 87 mg/dL (ref 73–118)
POTASSIUM: 3.6 meq/L (ref 3.3–4.7)
Sodium: 137 mEq/L (ref 128–145)
Total Bilirubin: 0.9 mg/dl (ref 0.20–1.60)
Total Protein: 8.5 g/dL — ABNORMAL HIGH (ref 6.4–8.1)

## 2016-01-08 LAB — LACTATE DEHYDROGENASE: LDH: 195 U/L (ref 125–245)

## 2016-01-08 NOTE — Progress Notes (Signed)
Hematology and Oncology Follow Up Visit  Barbara Cook [id number] 22-Nov-1955 60 y.o. 01/08/2016   Principle Diagnosis:  IgG kappa myeloma   Current Therapy:     Velcade/Revlimid/ -Velcade every 3 week dosing  Ninlaro/Revlimid - start in 11/03/2015  Zometa 4 mg IV every 3 month - next dose in August     Interim History:  Ms.  Barbara Cook is back for a follow-up. She seems be doing pretty well. She has not yet started the Ninlaro. She and her husband have been traveling. She does not want to take this while she is traveling.  She's had a couple small bruises. I think this probably from the aspirin that she is taking.  Her last myeloma studies showed an M spike of 1.7 g/dL. Her Ig G level is about 3000 mg/dL. Her kappa light chain is 8.8 mg/dL. Again these are holding relatively steady.   She's had no change in bowel or bladder habits. She's had no rashes. She's had no nausea or vomiting patient had no cough. She's had no mouth sores.   She has chronic back discomfort. This is not any worse.    Overall,, her performance status is ECOG 1  Medications:  Current Outpatient Prescriptions:  .  acetaminophen (TYLENOL) 500 MG tablet, Take 500 mg by mouth every 6 (six) hours as needed for mild pain or headache., Disp: , Rfl:  .  aspirin 325 MG tablet, Take 325 mg by mouth daily., Disp: , Rfl:  .  ergocalciferol (VITAMIN D2) 50000 UNITS capsule, Take 1 capsule (50,000 Units total) by mouth once a week., Disp: 12 capsule, Rfl: 3 .  ixazomib citrate (NINLARO) 3 MG capsule, Take 1 capsule (3 mg total) by mouth once a week. Take on an empty stomach. Take once a week for 3 weeks then off 1 week, Disp: 3 capsule, Rfl: 4 .  KLOR-CON 8 MEQ tablet, 1 TABLET ONCE A DAY ORALLY 30 DAY(S), Disp: , Rfl: 6 .  lenalidomide (REVLIMID) 20 MG capsule, TAKE 1 CAPSULE BY MOUTH ONCE DAILY FOR 21 DAYS OF A 28 DAY CYCLE. [id number], Disp: 21 capsule, Rfl: 0 .  magnesium oxide (MAG-OX) 400 (241.3 MG) MG tablet, Take  1 tablet (400 mg total) by mouth 2 (two) times daily., Disp: 60 tablet, Rfl: 3 .  Multiple Vitamin (MULTIVITAMIN) tablet, Take 1 tablet by mouth every evening. , Disp: , Rfl:  .  ondansetron (ZOFRAN) 8 MG tablet, Take 1 tablet (8 mg total) by mouth 2 (two) times daily. For nausea & vomiting. Take 1 tablet 1 hour prior to chemotherapy., Disp: 20 tablet, Rfl: 0 .  pantoprazole (PROTONIX) 40 MG tablet, Take 1 tablet (40 mg total) by mouth daily., Disp: 90 tablet, Rfl: 3 .  polyethylene glycol powder (GLYCOLAX/MIRALAX) powder, 1 capful daily as needed, Disp: 255 g, Rfl: 3 .  Pyridoxine HCl (VITAMIN B-6) 250 MG tablet, Take 1 tablet (250 mg total) by mouth daily., Disp: 30 tablet, Rfl: 6 .  spironolactone (ALDACTONE) 50 MG tablet, Take 50 mg by mouth daily., Disp: , Rfl: 4 .  torsemide (DEMADEX) 20 MG tablet, Take 1 tablet (20 mg total) by mouth daily as needed., Disp: 30 tablet, Rfl: 6  Current Facility-Administered Medications:  .  0.9 %  sodium chloride infusion, 500 mL, Intravenous, Continuous, Jerene Bears, MD  Allergies:  Allergies  Allergen Reactions  . Codeine Palpitations    Past Medical History, Surgical history, Social history, and Family History were reviewed and updated.  Review of  Systems: As above  Physical Exam:  height is 5\' 3"  (1.6 m) and weight is 152 lb (68.9 kg). Her oral temperature is 97.5 F (36.4 C). Her blood pressure is 111/63 and her pulse is 75. Her respiration is 16.   Well-developed and well-nourished African American female. Head and neck exam shows no ocular or oral lesions. There are no palpable cervical or supraclavicular lymph nodes.There is some tenderness to palpation just to the left of the midline at the base of her neck. I cannot palpate any lymph nodes. I cannot palpate her thyroid. She has a good carotid pulse. Lungs are clear. Cardiac exam regular rate and rhythm with no murmurs, rubs or bruits. Abdomen is soft. She is mildly obese. She really has no  abdominal distention. She has no palpable liver or spleen tip. Back exam shows no tenderness over the spine, ribs or hips. Extremity shows no clubbing, cyanosis or edema. Skin exam shows no rashes, ecchymoses or petechia. Neurological exam is nonfocal.  Lab Results  Component Value Date   WBC 2.0 (L) 01/08/2016   HGB 9.5 (L) 01/08/2016   HCT 27.9 (L) 01/08/2016   MCV 90 01/08/2016   PLT 172 01/08/2016     Chemistry      Component Value Date/Time   NA 137 12/04/2015 0746   K 3.8 12/04/2015 0746   CL 104 09/25/2015 0813   CO2 28 12/04/2015 0746   BUN 23.6 12/04/2015 0746   CREATININE 1.1 12/04/2015 0746      Component Value Date/Time   CALCIUM 9.6 12/04/2015 0746   ALKPHOS 52 12/04/2015 0746   AST 31 12/04/2015 0746   ALT 31 12/04/2015 0746   BILITOT 0.50 12/04/2015 0746         Impression and Plan: Ms. Barbara Cook is 60 year old African-American female with IgG kappa myeloma. Overall, her disease is holding fairly stable.  She still is not taking the Ninlaro. She's worried about side effects when she is on vacation. She promises that she will start this next week after she and her husband get back from the mountains.   She still has the anemia. She still has the leukopenia. This might be from the Revlimid. It might be from the actual myeloma. We will have to monitor this closely.   I will plan to see her back in another month.     [name], MD 8/31/20178:30 AM

## 2016-01-09 LAB — KAPPA/LAMBDA LIGHT CHAINS
IG KAPPA FREE LIGHT CHAIN: 80.4 mg/L — AB (ref 3.3–19.4)
Ig Lambda Free Light Chain: 15.4 mg/L (ref 5.7–26.3)
KAPPA/LAMBDA FLC RATIO: 5.22 — AB (ref 0.26–1.65)

## 2016-01-13 LAB — MULTIPLE MYELOMA PANEL, SERUM
ALBUMIN/GLOB SERPL: 1 (ref 0.7–1.7)
ALPHA 1: 0.2 g/dL (ref 0.0–0.4)
Albumin SerPl Elph-Mcnc: 3.9 g/dL (ref 2.9–4.4)
Alpha2 Glob SerPl Elph-Mcnc: 0.6 g/dL (ref 0.4–1.0)
B-Globulin SerPl Elph-Mcnc: 0.9 g/dL (ref 0.7–1.3)
Gamma Glob SerPl Elph-Mcnc: 2.6 g/dL — ABNORMAL HIGH (ref 0.4–1.8)
Globulin, Total: 4.3 g/dL — ABNORMAL HIGH (ref 2.2–3.9)
IGA/IMMUNOGLOBULIN A, SERUM: 125 mg/dL (ref 87–352)
IGM (IMMUNOGLOBIN M), SRM: 25 mg/dL — AB (ref 26–217)
M Protein SerPl Elph-Mcnc: 1.7 g/dL — ABNORMAL HIGH
TOTAL PROTEIN: 8.2 g/dL (ref 6.0–8.5)

## 2016-01-14 ENCOUNTER — Encounter: Payer: Self-pay | Admitting: *Deleted

## 2016-01-15 ENCOUNTER — Ambulatory Visit: Payer: Commercial Managed Care - HMO

## 2016-01-16 ENCOUNTER — Other Ambulatory Visit: Payer: Self-pay | Admitting: Hematology & Oncology

## 2016-01-16 DIAGNOSIS — C9002 Multiple myeloma in relapse: Secondary | ICD-10-CM

## 2016-01-19 ENCOUNTER — Other Ambulatory Visit: Payer: Self-pay | Admitting: Nurse Practitioner

## 2016-01-19 DIAGNOSIS — C9002 Multiple myeloma in relapse: Secondary | ICD-10-CM

## 2016-01-19 MED ORDER — LENALIDOMIDE 20 MG PO CAPS: ORAL_CAPSULE | ORAL | 0 refills | Status: DC

## 2016-01-23 ENCOUNTER — Ambulatory Visit (HOSPITAL_BASED_OUTPATIENT_CLINIC_OR_DEPARTMENT_OTHER): Payer: Commercial Managed Care - HMO

## 2016-01-23 VITALS — BP 102/61 | HR 74 | Temp 97.9°F | Resp 16

## 2016-01-23 DIAGNOSIS — C9000 Multiple myeloma not having achieved remission: Secondary | ICD-10-CM | POA: Diagnosis not present

## 2016-01-23 MED ORDER — ZOLEDRONIC ACID 4 MG/100ML IV SOLN
4.0000 mg | Freq: Once | INTRAVENOUS | Status: AC
  Administered 2016-01-23: 4 mg via INTRAVENOUS
  Filled 2016-01-23: qty 100

## 2016-01-23 NOTE — Patient Instructions (Signed)

## 2016-02-13 ENCOUNTER — Telehealth: Payer: Self-pay | Admitting: *Deleted

## 2016-02-13 NOTE — Telephone Encounter (Signed)
Patient usually takes her antiemetic prior to oral meds, but forgot this morning. She has since had one episode of vomiting. She would like to know if it's too late to take her antiemetic.   Educated patient that although it's best to take the medication prior to the meds to prevent nausea/vomtting, its definitely not too late to take after, or once vomiting starts. She understands and will go ahead and take her medication.

## 2016-02-16 ENCOUNTER — Other Ambulatory Visit: Payer: Self-pay | Admitting: Hematology & Oncology

## 2016-02-16 ENCOUNTER — Other Ambulatory Visit: Payer: Self-pay | Admitting: *Deleted

## 2016-02-16 DIAGNOSIS — C9002 Multiple myeloma in relapse: Secondary | ICD-10-CM

## 2016-02-16 MED ORDER — LENALIDOMIDE 20 MG PO CAPS: ORAL_CAPSULE | ORAL | 0 refills | Status: DC

## 2016-02-20 ENCOUNTER — Encounter: Payer: Self-pay | Admitting: Hematology & Oncology

## 2016-02-20 ENCOUNTER — Other Ambulatory Visit (HOSPITAL_BASED_OUTPATIENT_CLINIC_OR_DEPARTMENT_OTHER): Payer: Commercial Managed Care - HMO

## 2016-02-20 ENCOUNTER — Ambulatory Visit (HOSPITAL_BASED_OUTPATIENT_CLINIC_OR_DEPARTMENT_OTHER): Payer: Commercial Managed Care - HMO | Admitting: Hematology & Oncology

## 2016-02-20 DIAGNOSIS — C9002 Multiple myeloma in relapse: Secondary | ICD-10-CM

## 2016-02-20 DIAGNOSIS — K2100 Gastro-esophageal reflux disease with esophagitis, without bleeding: Secondary | ICD-10-CM

## 2016-02-20 DIAGNOSIS — C9000 Multiple myeloma not having achieved remission: Secondary | ICD-10-CM

## 2016-02-20 LAB — CBC WITH DIFFERENTIAL (CANCER CENTER ONLY)
BASO#: 0 10*3/uL (ref 0.0–0.2)
BASO%: 0.6 % (ref 0.0–2.0)
EOS%: 1.8 % (ref 0.0–7.0)
Eosinophils Absolute: 0.1 10*3/uL (ref 0.0–0.5)
HCT: 29.5 % — ABNORMAL LOW (ref 34.8–46.6)
HGB: 9.9 g/dL — ABNORMAL LOW (ref 11.6–15.9)
LYMPH#: 1.2 10*3/uL (ref 0.9–3.3)
LYMPH%: 36.6 % (ref 14.0–48.0)
MCH: 30.4 pg (ref 26.0–34.0)
MCHC: 33.6 g/dL (ref 32.0–36.0)
MCV: 91 fL (ref 81–101)
MONO#: 0.7 10*3/uL (ref 0.1–0.9)
MONO%: 20.4 % — ABNORMAL HIGH (ref 0.0–13.0)
NEUT#: 1.3 10*3/uL — ABNORMAL LOW (ref 1.5–6.5)
NEUT%: 40.6 % (ref 39.6–80.0)
PLATELETS: 150 10*3/uL (ref 145–400)
RBC: 3.26 10*6/uL — ABNORMAL LOW (ref 3.70–5.32)
RDW: 15 % (ref 11.1–15.7)
WBC: 3.3 10*3/uL — ABNORMAL LOW (ref 3.9–10.0)

## 2016-02-20 LAB — COMPREHENSIVE METABOLIC PANEL
ALT: 19 U/L (ref 0–55)
AST: 27 U/L (ref 5–34)
Albumin: 3.5 g/dL (ref 3.5–5.0)
Alkaline Phosphatase: 62 U/L (ref 40–150)
Anion Gap: 7 mEq/L (ref 3–11)
BILIRUBIN TOTAL: 0.44 mg/dL (ref 0.20–1.20)
BUN: 22.3 mg/dL (ref 7.0–26.0)
CHLORIDE: 103 meq/L (ref 98–109)
CO2: 29 meq/L (ref 22–29)
CREATININE: 1.3 mg/dL — AB (ref 0.6–1.1)
Calcium: 9.7 mg/dL (ref 8.4–10.4)
EGFR: 51 mL/min/{1.73_m2} — ABNORMAL LOW (ref >90)
Glucose: 74 mg/dl (ref 70–140)
Potassium: 3.8 mEq/L (ref 3.5–5.1)
Sodium: 139 mEq/L (ref 136–145)
TOTAL PROTEIN: 9.4 g/dL — AB (ref 6.4–8.3)

## 2016-02-20 MED ORDER — IXAZOMIB CITRATE 3 MG PO CAPS: 3.0000 mg | ORAL_CAPSULE | ORAL | 4 refills | Status: DC

## 2016-02-21 LAB — RETICULOCYTES: RETICULOCYTE COUNT: 0.9 % (ref 0.6–2.6)

## 2016-02-21 LAB — IGG, IGA, IGM
IGA/IMMUNOGLOBULIN A, SERUM: 124 mg/dL (ref 87–352)
IGM (IMMUNOGLOBIN M), SRM: 30 mg/dL (ref 26–217)

## 2016-02-21 NOTE — Progress Notes (Signed)
Hematology and Oncology Follow Up Visit  Barbara Cook [id number] Jul 14, 1955 60 y.o. 02/21/2016   Principle Diagnosis:  IgG kappa myeloma   Current Therapy:     Velcade/Revlimid/ -Velcade every 3 week dosing  Ninlaro/Revlimid - start in 11/03/2015  Zometa 4 mg IV every 3 month - next dose in August     Interim History:  Ms.  Cook is back for a follow-up. She said that she is started the [place]. She says she has a hard time taking it weekly. She does have a somewhat fragile constitution. She does have a hard time taking medication.  I will see about having her take Ninlaro every other week.   She's has a couple small bruises. I think this probably is from the aspirin that she is taking.  Her last myeloma studies showed an M spike of 1.7 g/dL. Her Ig G level is about 2818 mg/dL. Her kappa light chain is 8.0 mg/dL. Again these are holding relatively steady.   She's had no change in bowel or bladder habits. She's had no rashes. She's had no nausea or vomiting patient had no cough. She's had no mouth sores.   She has chronic back discomfort. This is not any worse.    Overall,, her performance status is ECOG 1  Medications:  Current Outpatient Prescriptions:  .  acetaminophen (TYLENOL) 500 MG tablet, Take 500 mg by mouth every 6 (six) hours as needed for mild pain or headache., Disp: , Rfl:  .  aspirin 325 MG tablet, Take 325 mg by mouth daily., Disp: , Rfl:  .  ergocalciferol (VITAMIN D2) 50000 UNITS capsule, Take 1 capsule (50,000 Units total) by mouth once a week., Disp: 12 capsule, Rfl: 3 .  ixazomib citrate (NINLARO) 3 MG capsule, Take 1 capsule (3 mg total) by mouth every 14 (fourteen) days. Take on an empty stomach. Take once a week for 3 weeks then off 1 week, Disp: 3 capsule, Rfl: 4 .  KLOR-CON 8 MEQ tablet, 1 TABLET ONCE A DAY ORALLY 30 DAY(S), Disp: , Rfl: 6 .  lenalidomide (REVLIMID) 20 MG capsule, TAKE 1 CAPSULE BY MOUTH ONCE DAILY FOR 21 DAYS OF A 28 DAY CYCLE.  Auth # [id number], Disp: 21 capsule, Rfl: 0 .  magnesium oxide (MAG-OX) 400 (241.3 MG) MG tablet, Take 1 tablet (400 mg total) by mouth 2 (two) times daily., Disp: 60 tablet, Rfl: 3 .  Multiple Vitamin (MULTIVITAMIN) tablet, Take 1 tablet by mouth every evening. , Disp: , Rfl:  .  ondansetron (ZOFRAN) 8 MG tablet, Take 1 tablet (8 mg total) by mouth 2 (two) times daily. For nausea & vomiting. Take 1 tablet 1 hour prior to chemotherapy., Disp: 20 tablet, Rfl: 0 .  pantoprazole (PROTONIX) 40 MG tablet, Take 1 tablet (40 mg total) by mouth daily., Disp: 90 tablet, Rfl: 3 .  polyethylene glycol powder (GLYCOLAX/MIRALAX) powder, 1 capful daily as needed, Disp: 255 g, Rfl: 3 .  Pyridoxine HCl (VITAMIN B-6) 250 MG tablet, Take 1 tablet (250 mg total) by mouth daily., Disp: 30 tablet, Rfl: 6 .  spironolactone (ALDACTONE) 50 MG tablet, Take 50 mg by mouth daily., Disp: , Rfl: 4 .  torsemide (DEMADEX) 20 MG tablet, Take 1 tablet (20 mg total) by mouth daily as needed., Disp: 30 tablet, Rfl: 6  Current Facility-Administered Medications:  .  0.9 %  sodium chloride infusion, 500 mL, Intravenous, Continuous, Jerene Bears, MD  Allergies:  Allergies  Allergen Reactions  . Codeine Palpitations  Past Medical History, Surgical history, Social history, and Family History were reviewed and updated.  Review of Systems: As above  Physical Exam:  height is 5\' 3"  (1.6 m) and weight is 155 lb 1.9 oz (70.4 kg). Her oral temperature is 98.2 F (36.8 C). Her blood pressure is 117/61 and her pulse is 75. Her respiration is 16.   Well-developed and well-nourished African American female. Head and neck exam shows no ocular or oral lesions. There are no palpable cervical or supraclavicular lymph nodes.There is some tenderness to palpation just to the left of the midline at the base of her neck. I cannot palpate any lymph nodes. I cannot palpate her thyroid. She has a good carotid pulse. Lungs are clear. Cardiac exam  regular rate and rhythm with no murmurs, rubs or bruits. Abdomen is soft. She is mildly obese. She really has no abdominal distention. She has no palpable liver or spleen tip. Back exam shows no tenderness over the spine, ribs or hips. Extremity shows no clubbing, cyanosis or edema. Skin exam shows no rashes, ecchymoses or petechia. Neurological exam is nonfocal.  Lab Results  Component Value Date   WBC 3.3 (L) 02/20/2016   HGB 9.9 (L) 02/20/2016   HCT 29.5 (L) 02/20/2016   MCV 91 02/20/2016   PLT 150 02/20/2016     Chemistry      Component Value Date/Time   NA 139 02/20/2016 1207   K 3.8 02/20/2016 1207   CL 106 01/08/2016 0753   CO2 29 02/20/2016 1207   BUN 22.3 02/20/2016 1207   CREATININE 1.3 (H) 02/20/2016 1207      Component Value Date/Time   CALCIUM 9.7 02/20/2016 1207   ALKPHOS 62 02/20/2016 1207   AST 27 02/20/2016 1207   ALT 19 02/20/2016 1207   BILITOT 0.44 02/20/2016 1207         Impression and Plan: Barbara Cook is 60 year old African-American female with IgG kappa myeloma. Overall, her disease is holding fairly stable.  She is having some issues with taking the Ninlaro weekly for 3 weeks on and one-week off. As such, I will try her on Ninlaro every other week.  It is possible as she just may have a low tolerance for this classification of medications.  Her hemoglobin is still a little on the lower side. Recheck her erythropoietin level year ago. It is quite low. We could always try her on Procrit or Aranesp. I want to try to keep her on a little medications as possible.  I would like to see her back in 6 weeks. I wonder try to get her through Thanksgiving.    [name], MD 10/14/201712:40 PM

## 2016-02-23 LAB — KAPPA/LAMBDA LIGHT CHAINS
IG LAMBDA FREE LIGHT CHAIN: 12.8 mg/L (ref 5.7–26.3)
Ig Kappa Free Light Chain: 110.7 mg/L — ABNORMAL HIGH (ref 3.3–19.4)
KAPPA/LAMBDA FLC RATIO: 8.65 — AB (ref 0.26–1.65)

## 2016-02-23 LAB — IRON AND TIBC
%SAT: 26 % (ref 21–57)
IRON: 81 ug/dL (ref 41–142)
TIBC: 316 ug/dL (ref 236–444)
UIBC: 235 ug/dL (ref 120–384)

## 2016-02-23 LAB — FERRITIN: FERRITIN: 102 ng/mL (ref 9–269)

## 2016-02-24 LAB — PROTEIN ELECTROPHORESIS, SERUM, WITH REFLEX
A/G RATIO SPE: 0.8 (ref 0.7–1.7)
Albumin: 3.9 g/dL (ref 2.9–4.4)
Alpha 1: 0.2 g/dL (ref 0.0–0.4)
Alpha 2: 0.7 g/dL (ref 0.4–1.0)
BETA: 1 g/dL (ref 0.7–1.3)
GAMMA GLOBULIN: 3.2 g/dL — AB (ref 0.4–1.8)
GLOBULIN, TOTAL: 5 g/dL — AB (ref 2.2–3.9)
Interpretation(See Below): 0
M-Spike, %: 2.4 g/dL — ABNORMAL HIGH
PDF: 0
TOTAL PROTEIN: 8.9 g/dL — AB (ref 6.0–8.5)

## 2016-03-03 ENCOUNTER — Ambulatory Visit (INDEPENDENT_AMBULATORY_CARE_PROVIDER_SITE_OTHER): Payer: Commercial Managed Care - HMO | Admitting: Internal Medicine

## 2016-03-03 ENCOUNTER — Encounter: Payer: Self-pay | Admitting: Internal Medicine

## 2016-03-03 VITALS — BP 100/62 | HR 58 | Ht 63.0 in | Wt 157.4 lb

## 2016-03-03 DIAGNOSIS — K219 Gastro-esophageal reflux disease without esophagitis: Secondary | ICD-10-CM | POA: Diagnosis not present

## 2016-03-03 DIAGNOSIS — R140 Abdominal distension (gaseous): Secondary | ICD-10-CM | POA: Diagnosis not present

## 2016-03-03 DIAGNOSIS — K5900 Constipation, unspecified: Secondary | ICD-10-CM | POA: Diagnosis not present

## 2016-03-03 DIAGNOSIS — R1010 Upper abdominal pain, unspecified: Secondary | ICD-10-CM

## 2016-03-03 NOTE — Patient Instructions (Signed)
You have been scheduled for a CT scan of the abdomen and pelvis at [facility] ([address]---this is in the same building as [facility]).   You are scheduled on 03/09/16 at 2:30pm. You should arrive 15 minutes prior to your appointment time for registration. Please follow the written instructions below on the day of your exam:  WARNING: IF YOU ARE ALLERGIC TO IODINE/X-RAY DYE, PLEASE NOTIFY RADIOLOGY IMMEDIATELY AT [phone]! YOU WILL BE GIVEN A 13 HOUR PREMEDICATION PREP.  1) Do not eat or drink anything after 10:30AM (4 hours prior to your test) 2) You have been given 2 bottles of oral contrast to drink. The solution may taste               better if refrigerated, but do NOT add ice or any other liquid to this solution. Shake             well before drinking.    Drink 1 bottle of contrast @ 12:30PM (2 hours prior to your exam)  Drink 1 bottle of contrast @ 1:30PM (1 hour prior to your exam)  You may take any medications as prescribed with a small amount of water except for the following: Metformin, Glucophage, Glucovance, Avandamet, Riomet, Fortamet, Actoplus Met, Janumet, Glumetza or Metaglip. The above medications must be held the day of the exam AND 48 hours after the exam.  The purpose of you drinking the oral contrast is to aid in the visualization of your intestinal tract. The contrast solution may cause some diarrhea. Before your exam is started, you will be given a small amount of fluid to drink. Depending on your individual set of symptoms, you may also receive an intravenous injection of x-ray contrast/dye. Plan on being at [facility] for 30 minutes or longer, depending on the type of exam you are having performed.  This test typically takes 30-45 minutes to complete.  If you have any questions regarding your exam or if you need to reschedule, you may call the CT department at [phone] between the hours of 8:00 am and 5:00 pm,  Monday-Friday.  ________________________________________________________________________   Continue your pantoprazole and miralax.   Follow up with Dr Hilarie Fredrickson in January 2018.

## 2016-03-03 NOTE — Progress Notes (Signed)
Subjective:    Patient ID: Barbara Cook, female    DOB: May 25, 1955, 60 y.o.   MRN: [id number]  HPI Barbara Cook is a 60 year old female with past medical history of GERD, IBS, multiple myeloma who is seen in follow-up. She was initially seen in July 2017 to evaluate epigastric abdominal pain bloating and for colorectal cancer screening. She had an upper endoscopy and colonoscopy which were performed on 12/25/2015. EGD showed patchy moderate inflammation with erosions and erythema in the gastric body and antrum which was biopsied. The resting exam was normal. Gastric biopsies were unremarkable. Colonoscopy was normal. I started her on pantoprazole 40 mg daily because of the inflammation seen endoscopically. She also began MiraLAX for mild constipation and bloating.  She reports that the pantoprazole has helped her epigastric discomfort significantly. Her globus and reflux is also improved. She still having issues with abdominal bloating which has been present since initial consultation. She requests CT scan for this symptom. MiraLAX is working well for her mild constipation but she is still having GI upset when using the Ninlaro for MM.  She reports that with each dose, once per week, she will have urgent loose stools which can be difficult to control. She uses Imodium on these days. Dr. Marin Olp has decreased this to every 2 weeks for now.   Review of Systems As per history of present illness, otherwise negative  Current Medications, Allergies, Past Medical History, Past Surgical History, Family History and Social History were reviewed in [employer] record.     Objective:   Physical Exam BP 100/62   Pulse (!) 58   Ht 5' 3"  (1.6 m)   Wt 157 lb 6 oz (71.4 kg)   BMI 27.88 kg/m  Constitutional: Well-developed and well-nourished. No distress. HEENT: Normocephalic and atraumatic. Oropharynx is clear and moist. No oropharyngeal exudate. Conjunctivae are normal.  No  scleral icterus. Neck: Neck supple. Trachea midline. Cardiovascular: Normal rate, regular rhythm and intact distal pulses.  Pulmonary/chest: Effort normal and breath sounds normal. No wheezing, rales or rhonchi. Abdominal: Soft, nontender, nondistended. Bowel sounds active throughout.  Extremities: no clubbing, cyanosis, or edema Neurological: Alert and oriented to person place and time. Skin: Skin is warm and dry.  Psychiatric: Normal mood and affect. Behavior is normal.  CBC    Component Value Date/Time   WBC 3.3 (L) 02/20/2016 1207   WBC 3.9 (L) 08/22/2014 0956   RBC 3.26 (L) 02/20/2016 1207   RBC 3.26 (L) 12/25/2014 0816   RBC 3.34 (L) 08/22/2014 0956   HGB 9.9 (L) 02/20/2016 1207   HGB 11.5 (L) 10/26/2005 1453   HCT 29.5 (L) 02/20/2016 1207   HCT 34.8 10/26/2005 1453   PLT 150 02/20/2016 1207   PLT 177 10/26/2005 1453   MCV 91 02/20/2016 1207   MCV 88.7 10/26/2005 1453   MCH 30.4 02/20/2016 1207   MCH 29.3 08/22/2014 0956   MCHC 33.6 02/20/2016 1207   MCHC 32.6 08/22/2014 0956   RDW 15.0 02/20/2016 1207   RDW 13.5 10/26/2005 1453   LYMPHSABS 1.2 02/20/2016 1207   LYMPHSABS 2.6 10/26/2005 1453   MONOABS 0.4 08/22/2014 0956   MONOABS 0.5 10/26/2005 1453   EOSABS 0.1 02/20/2016 1207   BASOSABS 0.0 02/20/2016 1207   BASOSABS 0.0 10/26/2005 1453   CMP     Component Value Date/Time   NA 139 02/20/2016 1207   K 3.8 02/20/2016 1207   CL 106 01/08/2016 0753   CO2 29 02/20/2016 1207  GLUCOSE 74 02/20/2016 1207   GLUCOSE 87 01/08/2016 0753   BUN 22.3 02/20/2016 1207   CREATININE 1.3 (H) 02/20/2016 1207   CALCIUM 9.7 02/20/2016 1207   PROT 8.9 (H) 02/20/2016 1207   PROT 9.4 (H) 02/20/2016 1207   ALBUMIN 3.5 02/20/2016 1207   AST 27 02/20/2016 1207   ALT 19 02/20/2016 1207   ALKPHOS 62 02/20/2016 1207   BILITOT 0.44 02/20/2016 1207   GFRNONAA 64 (L) 08/22/2014 0956   GFRAA 75 (L) 08/22/2014 0956       Assessment & Plan:   60 year old female with past medical  history of GERD, IBS, multiple myeloma who is seen in follow-up. She was initially seen in July 2017 to evaluate epigastric abdominal pain bloating and for colorectal cancer screening.  1. Abdominal bloating -- unclear etiology that she is worried about this symptom. She requests cross-sectional imaging and none has been done recently. After our discussion we will pursue CT scan of the abdomen and pelvis with contrast. If unremarkable could consider treatment for bacterial overgrowth empirically.  2. Epigastric pain/GERD -- improved to resolution on pantoprazole. We'll continue pantoprazole 40 mg daily for now  3. Mild constipation -- improved with MiraLAX though bloating persists, see #1. We'll continue MiraLAX 17 g per day except on days she is taking her multiple myeloma medication  Return in 3-4 months, sooner if necessary 25 minutes spent with the patient today. Greater than 50% was spent in counseling and coordination of care with the patient

## 2016-03-05 ENCOUNTER — Other Ambulatory Visit: Payer: Self-pay | Admitting: Family

## 2016-03-05 ENCOUNTER — Ambulatory Visit: Payer: Commercial Managed Care - HMO | Admitting: Internal Medicine

## 2016-03-05 DIAGNOSIS — M899 Disorder of bone, unspecified: Secondary | ICD-10-CM

## 2016-03-09 ENCOUNTER — Ambulatory Visit (INDEPENDENT_AMBULATORY_CARE_PROVIDER_SITE_OTHER)
Admission: RE | Admit: 2016-03-09 | Discharge: 2016-03-09 | Disposition: A | Discharge status: Home / Self Care | Payer: Commercial Managed Care - HMO | Source: Ambulatory Visit | Attending: Internal Medicine | Admitting: Internal Medicine

## 2016-03-09 ENCOUNTER — Telehealth: Payer: Self-pay | Admitting: Internal Medicine

## 2016-03-09 DIAGNOSIS — R1010 Upper abdominal pain, unspecified: Secondary | ICD-10-CM

## 2016-03-09 DIAGNOSIS — K5900 Constipation, unspecified: Secondary | ICD-10-CM | POA: Diagnosis not present

## 2016-03-09 DIAGNOSIS — R140 Abdominal distension (gaseous): Secondary | ICD-10-CM

## 2016-03-09 MED ORDER — IOPAMIDOL (ISOVUE-300) INJECTION 61%
100.0000 mL | Freq: Once | INTRAVENOUS | Status: AC | PRN
  Administered 2016-03-09: 100 mL via INTRAVENOUS

## 2016-03-09 NOTE — Telephone Encounter (Signed)
Spoke with pt and discussed with her that the contrast can upset your stomach sometimes and that she may want to wear a depend just in case. Pt verbalized understanding.

## 2016-03-15 ENCOUNTER — Other Ambulatory Visit: Payer: Self-pay | Admitting: Hematology & Oncology

## 2016-03-15 ENCOUNTER — Other Ambulatory Visit: Payer: Self-pay

## 2016-03-15 DIAGNOSIS — C9002 Multiple myeloma in relapse: Secondary | ICD-10-CM

## 2016-03-15 MED ORDER — LENALIDOMIDE 20 MG PO CAPS: 20.0000 mg | ORAL_CAPSULE | Freq: Every day | ORAL | 6 refills | Status: DC

## 2016-03-24 ENCOUNTER — Telehealth: Payer: Self-pay | Admitting: Internal Medicine

## 2016-03-24 MED ORDER — HYOSCYAMINE SULFATE 0.125 MG SL SUBL: 0.1250 mg | SUBLINGUAL_TABLET | SUBLINGUAL | 0 refills | Status: DC | PRN

## 2016-03-24 NOTE — Telephone Encounter (Signed)
She can try Levsin 0.125 mg sublingual every 4-6 hours as needed for lower abdominal crampy discomfort Call if not better or worsening

## 2016-03-24 NOTE — Telephone Encounter (Signed)
Spoke with pt and she is aware, script sent to pharmacy. 

## 2016-03-24 NOTE — Telephone Encounter (Signed)
Pt states she is having multiple bm's/day but they are formed, not diarrhea. States she is having some urgency and cramping pains in her colon that come and go. Pt would like something called in for this. Please advise.

## 2016-03-28 ENCOUNTER — Other Ambulatory Visit: Payer: Self-pay | Admitting: Hematology & Oncology

## 2016-03-28 DIAGNOSIS — C9000 Multiple myeloma not having achieved remission: Secondary | ICD-10-CM

## 2016-04-02 ENCOUNTER — Ambulatory Visit: Payer: Commercial Managed Care - HMO

## 2016-04-02 ENCOUNTER — Other Ambulatory Visit (HOSPITAL_BASED_OUTPATIENT_CLINIC_OR_DEPARTMENT_OTHER): Payer: Commercial Managed Care - HMO

## 2016-04-02 ENCOUNTER — Ambulatory Visit (HOSPITAL_BASED_OUTPATIENT_CLINIC_OR_DEPARTMENT_OTHER): Payer: Commercial Managed Care - HMO | Admitting: Hematology & Oncology

## 2016-04-02 VITALS — BP 110/66 | HR 71 | Temp 97.7°F | Resp 16 | Wt 160.0 lb

## 2016-04-02 DIAGNOSIS — C9002 Multiple myeloma in relapse: Secondary | ICD-10-CM

## 2016-04-02 LAB — CBC WITH DIFFERENTIAL (CANCER CENTER ONLY)
BASO#: 0 10*3/uL (ref 0.0–0.2)
BASO%: 0.8 % (ref 0.0–2.0)
EOS%: 1.7 % (ref 0.0–7.0)
Eosinophils Absolute: 0 10*3/uL (ref 0.0–0.5)
HCT: 27.1 % — ABNORMAL LOW (ref 34.8–46.6)
HGB: 9.2 g/dL — ABNORMAL LOW (ref 11.6–15.9)
LYMPH#: 1.1 10*3/uL (ref 0.9–3.3)
LYMPH%: 45.5 % (ref 14.0–48.0)
MCH: 30.5 pg (ref 26.0–34.0)
MCHC: 33.9 g/dL (ref 32.0–36.0)
MCV: 90 fL (ref 81–101)
MONO#: 0.3 10*3/uL (ref 0.1–0.9)
MONO%: 12.8 % (ref 0.0–13.0)
NEUT#: 1 10*3/uL — ABNORMAL LOW (ref 1.5–6.5)
NEUT%: 39.2 % — AB (ref 39.6–80.0)
PLATELETS: 150 10*3/uL (ref 145–400)
RBC: 3.02 10*6/uL — ABNORMAL LOW (ref 3.70–5.32)
RDW: 14.7 % (ref 11.1–15.7)
WBC: 2.4 10*3/uL — ABNORMAL LOW (ref 3.9–10.0)

## 2016-04-02 LAB — CMP (CANCER CENTER ONLY)
ALK PHOS: 51 U/L (ref 26–84)
ALT: 27 U/L (ref 10–47)
AST: 39 U/L — AB (ref 11–38)
Albumin: 3.7 g/dL (ref 3.3–5.5)
BILIRUBIN TOTAL: 0.7 mg/dL (ref 0.20–1.60)
BUN: 24 mg/dL — AB (ref 7–22)
CO2: 27 meq/L (ref 18–33)
Calcium: 9.5 mg/dL (ref 8.0–10.3)
Chloride: 107 mEq/L (ref 98–108)
Creat: 1.1 mg/dl (ref 0.6–1.2)
GLUCOSE: 87 mg/dL (ref 73–118)
Potassium: 3.9 mEq/L (ref 3.3–4.7)
Sodium: 140 mEq/L (ref 128–145)
Total Protein: 9.5 g/dL — ABNORMAL HIGH (ref 6.4–8.1)

## 2016-04-02 LAB — LACTATE DEHYDROGENASE: LDH: 211 U/L (ref 125–245)

## 2016-04-02 NOTE — Progress Notes (Signed)
Hematology and Oncology Follow Up Visit  Barbara Cook [id number] Sep 10, 1955 60 y.o. 04/02/2016   Principle Diagnosis:  IgG kappa myeloma   Current Therapy:     Velcade/Revlimid/ -Velcade every 3 week dosing  Ninlaro/Revlimid - start in 11/03/2015  Zometa 4 mg IV every 3 month - next dose in August     Interim History:  Ms.  Cook is back for a follow-up. She admits that she is not taking the Ninlaro as she should. I suppose that I should not be surprised by this. She has always had issues with taking the oral medications. She is worried about her irritable bowel. I told her that the irritable bowel probably can be addressed by her gastroenterologist. She's not sure who her gastroenterologist is.  Unfortunately, her myeloma is worsening. Her last M spike was up to 2.4 g/dL. Her IgG level was 3600 mg/dL. Her Kappa Lightchain was 11 mg/dL.  She had a CT scan done recently. This showed some constipation. It showed the changes in her spine from the myeloma which are not surprising.  She and her husband did have a nice Thanksgiving. I am happy for her that she had this.  She has had no fever. She has had no cough. She has had no nausea or vomiting.  There's been no leg swelling. She's had no rashes.  I talked her she and her husband at length about the fact that a change probably is going to have to be made with her myeloma therapy.   Overall,, her performance status is ECOG 1  Medications:  Current Outpatient Prescriptions:  .  aspirin 325 MG tablet, Take 325 mg by mouth daily., Disp: , Rfl:  .  hyoscyamine (LEVSIN SL) 0.125 MG SL tablet, Place 1 tablet (0.125 mg total) under the tongue every 4 (four) hours as needed., Disp: 60 tablet, Rfl: 0 .  ixazomib citrate (NINLARO) 3 MG capsule, Take 1 capsule (3 mg total) by mouth every 14 (fourteen) days. Take on an empty stomach. Take once a week for 3 weeks then off 1 week, Disp: 3 capsule, Rfl: 4 .  KLOR-CON 8 MEQ tablet, 1 TABLET  ONCE A DAY ORALLY 30 DAY(S), Disp: , Rfl: 6 .  lenalidomide (REVLIMID) 20 MG capsule, Take 1 capsule (20 mg total) by mouth daily. X 21 days, then 7 days off. Swallow whole.  Do not Crush. Authorization # [id number], Disp: 21 capsule, Rfl: 6 .  magnesium oxide (MAG-OX) 400 (241.3 MG) MG tablet, Take 1 tablet (400 mg total) by mouth 2 (two) times daily., Disp: 60 tablet, Rfl: 3 .  Multiple Vitamin (MULTIVITAMIN) tablet, Take 1 tablet by mouth every evening. , Disp: , Rfl:  .  ondansetron (ZOFRAN) 8 MG tablet, Take 1 tablet (8 mg total) by mouth 2 (two) times daily. For nausea & vomiting. Take 1 tablet 1 hour prior to chemotherapy., Disp: 20 tablet, Rfl: 0 .  pantoprazole (PROTONIX) 40 MG tablet, Take 1 tablet (40 mg total) by mouth daily., Disp: 90 tablet, Rfl: 3 .  polyethylene glycol powder (GLYCOLAX/MIRALAX) powder, 1 capful daily as needed, Disp: 255 g, Rfl: 3 .  Probiotic Product (ALIGN) 4 MG CAPS, Take 1 capsule by mouth daily., Disp: , Rfl:  .  Pyridoxine HCl (VITAMIN B-6) 250 MG tablet, Take 1 tablet (250 mg total) by mouth daily., Disp: 30 tablet, Rfl: 6 .  spironolactone (ALDACTONE) 50 MG tablet, Take 50 mg by mouth daily., Disp: , Rfl: 4 .  torsemide (DEMADEX) 20 MG  tablet, Take 1 tablet (20 mg total) by mouth daily as needed., Disp: 30 tablet, Rfl: 6 .  Vitamin D, Ergocalciferol, (DRISDOL) 50000 units CAPS capsule, TAKE 1 CAPSULE (50,000 UNITS TOTAL) BY MOUTH ONCE A WEEK., Disp: 12 capsule, Rfl: 3 .  acetaminophen (TYLENOL) 500 MG tablet, Take 500 mg by mouth every 6 (six) hours as needed for mild pain or headache., Disp: , Rfl:   Current Facility-Administered Medications:  .  0.9 %  sodium chloride infusion, 500 mL, Intravenous, Continuous, Jerene Bears, MD  Allergies:  Allergies  Allergen Reactions  . Codeine Palpitations    Past Medical History, Surgical history, Social history, and Family History were reviewed and updated.  Review of Systems: As above  Physical Exam:  weight  is 160 lb (72.6 kg). Her oral temperature is 97.7 F (36.5 C). Her blood pressure is 110/66 and her pulse is 71. Her respiration is 16.   Well-developed and well-nourished African American female. Head and neck exam shows no ocular or oral lesions. There are no palpable cervical or supraclavicular lymph nodes.There is some tenderness to palpation just to the left of the midline at the base of her neck. I cannot palpate any lymph nodes. I cannot palpate her thyroid. She has a good carotid pulse. Lungs are clear. Cardiac exam regular rate and rhythm with no murmurs, rubs or bruits. Abdomen is soft. She is mildly obese. She really has no abdominal distention. She has no palpable liver or spleen tip. Back exam shows no tenderness over the spine, ribs or hips. Extremity shows no clubbing, cyanosis or edema. Skin exam shows no rashes, ecchymoses or petechia. Neurological exam is nonfocal.  Lab Results  Component Value Date   WBC 2.4 (L) 04/02/2016   HGB 9.2 (L) 04/02/2016   HCT 27.1 (L) 04/02/2016   MCV 90 04/02/2016   PLT 150 04/02/2016     Chemistry      Component Value Date/Time   NA 140 04/02/2016 0752   NA 139 02/20/2016 1207   K 3.9 04/02/2016 0752   K 3.8 02/20/2016 1207   CL 107 04/02/2016 0752   CO2 27 04/02/2016 0752   CO2 29 02/20/2016 1207   BUN 24 (H) 04/02/2016 0752   BUN 22.3 02/20/2016 1207   CREATININE 1.1 04/02/2016 0752   CREATININE 1.3 (H) 02/20/2016 1207      Component Value Date/Time   CALCIUM 9.5 04/02/2016 0752   CALCIUM 9.7 02/20/2016 1207   ALKPHOS 51 04/02/2016 0752   ALKPHOS 62 02/20/2016 1207   AST 39 (H) 04/02/2016 0752   AST 27 02/20/2016 1207   ALT 27 04/02/2016 0752   ALT 19 02/20/2016 1207   BILITOT 0.70 04/02/2016 0752   BILITOT 0.44 02/20/2016 1207         Impression and Plan: Barbara Cook is 60 year old African-American female with IgG kappa myeloma.   I believe that her disease is clearly progressing. Her IgG level has been going up. Her  M spike has been trending up a little bit.  I really think that given the fact that she really does not take the [facility] as she should, I think we have to consider IV therapy. I think that she would do fairly well with Kyprolis.  I spent about 45 minutes talking with she and her husband about this. I have known her for probably 8 or 9 years. She has done well. She is not interested in a transplant. We have talked about this in the past.  Thankfully, we have several options that we can consider for her. I think a trial of the Kyprolis as a single agent would be reasonable. If necessary we can always add Cytoxan. This would be very reasonable if we needed to.   She is going to think about this. She understands after we do this, she will need a Port-A-Cath. I explained to her what a Port-A-Cath was.   I really think that Kyprolis will help area and she is worried about having irritable bowel. I told her that I'm not sure if the Kyprolis would affect the irritable bowel in a negative way.  She says she will call me on Monday.   If she decides to go with a Kyprolis, we probably would get started in a couple weeks.    [name], MD 11/24/20171:58 PM

## 2016-04-02 NOTE — Patient Instructions (Signed)
Carfilzomib injection What is this medicine? CARFILZOMIB (kar FILZ oh mib) targets a specific protein within cancer cells and stops the cancer cells from growing. It is used to treat multiple myeloma. COMMON BRAND NAME(S): KYPROLIS What should I tell my health care provider before I take this medicine? They need to know if you have any of these conditions: -heart disease -history of blood clots -irregular heartbeat -kidney disease -liver disease -lung or breathing disease -an unusual or allergic reaction to carfilzomib, or other medicines, foods, dyes, or preservatives -pregnant or trying to get pregnant -breast-feeding How should I use this medicine? This medicine is for injection or infusion into a vein. It is given by a health care professional in a hospital or clinic setting. Talk to your pediatrician regarding the use of this medicine in children. Special care may be needed. What if I miss a dose? It is important not to miss your dose. Call your doctor or health care professional if you are unable to keep an appointment. What may interact with this medicine? Interactions are not expected. Give your health care provider a list of all the medicines, herbs, non-prescription drugs, or dietary supplements you use. Also tell them if you smoke, drink alcohol, or use illegal drugs. Some items may interact with your medicine. What should I watch for while using this medicine? Your condition will be monitored carefully while you are receiving this medicine. Report any side effects. Continue your course of treatment even though you feel ill unless your doctor tells you to stop. You may need blood work done while you are taking this medicine. Do not become pregnant while taking this medicine or for at least 30 days after stopping it. Women should inform their doctor if they wish to become pregnant or think they might be pregnant. There is a potential for serious side effects to an unborn child.  Men should not father a child while taking this medicine and for 90 days after stopping it. Talk to your health care professional or pharmacist for more information. Do not breast-feed an infant while taking this medicine. Check with your doctor or health care professional if you get an attack of severe diarrhea, nausea and vomiting, or if you sweat a lot. The loss of too much body fluid can make it dangerous for you to take this medicine. You may get dizzy. Do not drive, use machinery, or do anything that needs mental alertness until you know how this medicine affects you. Do not stand or sit up quickly, especially if you are an older patient. This reduces the risk of dizzy or fainting spells. What side effects may I notice from receiving this medicine? Side effects that you should report to your doctor or health care professional as soon as possible: -allergic reactions like skin rash, itching or hives, swelling of the face, lips, or tongue -confusion -dizziness -feeling faint or lightheaded -fever or chills -palpitations -seizures -signs and symptoms of bleeding such as bloody or black, tarry stools; red or dark-brown urine; spitting up blood or brown material that looks like coffee grounds; red spots on the skin; unusual bruising or bleeding including from the eye, gums, or nose -signs and symptoms of a blood clot such as breathing problems; changes in vision; chest pain; severe, sudden headache; pain, swelling, warmth in the leg; trouble speaking; sudden numbness or weakness of the face, arm or leg -signs and symptoms of kidney injury like trouble passing urine or change in the amount of urine -signs and   symptoms of liver injury like dark yellow or brown urine; general ill feeling or flu-like symptoms; light-colored stools; loss of appetite; nausea; right upper belly pain; unusually weak or tired; yellowing of the eyes or skin Side effects that usually do not require medical attention (report to  your doctor or health care professional if they continue or are bothersome): -back pain -cough -diarrhea -headache -muscle cramps -vomiting Where should I keep my medicine? This drug is given in a hospital or clinic and will not be stored at home.  2017 Elsevier/Gold Standard (2015-05-29 13:39:23)  

## 2016-04-03 LAB — IGG, IGA, IGM
IgA, Qn, Serum: 118 mg/dL (ref 87–352)
IgG, Qn, Serum: 4078 mg/dL — ABNORMAL HIGH (ref 700–1600)
IgM, Qn, Serum: 28 mg/dL (ref 26–217)

## 2016-04-05 LAB — KAPPA/LAMBDA LIGHT CHAINS
IG KAPPA FREE LIGHT CHAIN: 94.1 mg/L — AB (ref 3.3–19.4)
Ig Lambda Free Light Chain: 15.2 mg/L (ref 5.7–26.3)
Kappa/Lambda FluidC Ratio: 6.19 — ABNORMAL HIGH (ref 0.26–1.65)

## 2016-04-07 LAB — PROTEIN ELECTROPHORESIS, SERUM, WITH REFLEX
A/G Ratio: 0.7 (ref 0.7–1.7)
ALPHA 1: 0.2 g/dL (ref 0.0–0.4)
Albumin: 3.8 g/dL (ref 2.9–4.4)
Alpha 2: 0.6 g/dL (ref 0.4–1.0)
Beta: 0.9 g/dL (ref 0.7–1.3)
GLOBULIN, TOTAL: 5.1 g/dL — AB (ref 2.2–3.9)
Gamma Globulin: 3.4 g/dL — ABNORMAL HIGH (ref 0.4–1.8)
INTERPRETATION(SEE BELOW): 0
M-SPIKE, %: 2.6 g/dL — AB
Total Protein: 8.9 g/dL — ABNORMAL HIGH (ref 6.0–8.5)

## 2016-04-07 NOTE — Addendum Note (Signed)
Addended by: Burney Gauze R on: 04/07/2016 05:50 PM   Modules accepted: Orders

## 2016-04-12 ENCOUNTER — Other Ambulatory Visit: Payer: Self-pay | Admitting: Hematology & Oncology

## 2016-04-12 DIAGNOSIS — C9002 Multiple myeloma in relapse: Secondary | ICD-10-CM

## 2016-04-13 ENCOUNTER — Other Ambulatory Visit: Payer: Self-pay | Admitting: Radiology

## 2016-04-13 ENCOUNTER — Other Ambulatory Visit: Payer: Self-pay | Admitting: Student

## 2016-04-14 ENCOUNTER — Ambulatory Visit (HOSPITAL_COMMUNITY)
Admission: RE | Admit: 2016-04-14 | Discharge: 2016-04-14 | Disposition: A | Discharge status: Home / Self Care | Payer: Commercial Managed Care - HMO | Source: Ambulatory Visit | Attending: Hematology & Oncology | Admitting: Hematology & Oncology

## 2016-04-14 ENCOUNTER — Encounter: Payer: Self-pay | Admitting: *Deleted

## 2016-04-14 ENCOUNTER — Other Ambulatory Visit: Payer: Self-pay | Admitting: Hematology & Oncology

## 2016-04-14 ENCOUNTER — Other Ambulatory Visit: Payer: Commercial Managed Care - HMO

## 2016-04-14 ENCOUNTER — Encounter (HOSPITAL_COMMUNITY): Payer: Self-pay

## 2016-04-14 DIAGNOSIS — K219 Gastro-esophageal reflux disease without esophagitis: Secondary | ICD-10-CM | POA: Diagnosis not present

## 2016-04-14 DIAGNOSIS — C9000 Multiple myeloma not having achieved remission: Secondary | ICD-10-CM | POA: Diagnosis present

## 2016-04-14 DIAGNOSIS — Z79899 Other long term (current) drug therapy: Secondary | ICD-10-CM | POA: Insufficient documentation

## 2016-04-14 DIAGNOSIS — M353 Polymyalgia rheumatica: Secondary | ICD-10-CM | POA: Insufficient documentation

## 2016-04-14 DIAGNOSIS — C9002 Multiple myeloma in relapse: Secondary | ICD-10-CM

## 2016-04-14 DIAGNOSIS — Z87891 Personal history of nicotine dependence: Secondary | ICD-10-CM | POA: Insufficient documentation

## 2016-04-14 HISTORY — PX: IR GENERIC HISTORICAL: IMG1180011

## 2016-04-14 LAB — CBC WITH DIFFERENTIAL/PLATELET
Basophils Absolute: 0 10*3/uL (ref 0.0–0.1)
Basophils Relative: 1 %
EOS ABS: 0.1 10*3/uL (ref 0.0–0.7)
EOS PCT: 2 %
HCT: 29.9 % — ABNORMAL LOW (ref 36.0–46.0)
Hemoglobin: 10.1 g/dL — ABNORMAL LOW (ref 12.0–15.0)
LYMPHS ABS: 1.9 10*3/uL (ref 0.7–4.0)
LYMPHS PCT: 43 %
MCH: 29.8 pg (ref 26.0–34.0)
MCHC: 33.8 g/dL (ref 30.0–36.0)
MCV: 88.2 fL (ref 78.0–100.0)
MONO ABS: 0.5 10*3/uL (ref 0.1–1.0)
Monocytes Relative: 11 %
Neutro Abs: 1.9 10*3/uL (ref 1.7–7.7)
Neutrophils Relative %: 43 %
PLATELETS: 181 10*3/uL (ref 150–400)
RBC: 3.39 MIL/uL — ABNORMAL LOW (ref 3.87–5.11)
RDW: 15.2 % (ref 11.5–15.5)
WBC: 4.4 10*3/uL (ref 4.0–10.5)

## 2016-04-14 LAB — PROTIME-INR
INR: 0.93
PROTHROMBIN TIME: 12.5 s (ref 11.4–15.2)

## 2016-04-14 LAB — APTT: APTT: 30 s (ref 24–36)

## 2016-04-14 MED ORDER — LIDOCAINE-PRILOCAINE 2.5-2.5 % EX CREA: TOPICAL_CREAM | CUTANEOUS | 3 refills | Status: DC

## 2016-04-14 MED ORDER — LIDOCAINE-EPINEPHRINE (PF) 2 %-1:200000 IJ SOLN
INTRAMUSCULAR | Status: AC | PRN
  Administered 2016-04-14: 2 mL
  Administered 2016-04-14: 10 mL

## 2016-04-14 MED ORDER — MIDAZOLAM HCL 2 MG/2ML IJ SOLN
INTRAMUSCULAR | Status: AC | PRN
  Administered 2016-04-14 (×5): 1 mg via INTRAVENOUS

## 2016-04-14 MED ORDER — HEPARIN SOD (PORK) LOCK FLUSH 100 UNIT/ML IV SOLN
INTRAVENOUS | Status: AC
  Filled 2016-04-14: qty 5

## 2016-04-14 MED ORDER — MIDAZOLAM HCL 2 MG/2ML IJ SOLN
INTRAMUSCULAR | Status: AC
  Filled 2016-04-14: qty 6

## 2016-04-14 MED ORDER — LORAZEPAM 0.5 MG PO TABS: 0.5000 mg | ORAL_TABLET | Freq: Four times a day (QID) | ORAL | 0 refills | Status: DC | PRN

## 2016-04-14 MED ORDER — SODIUM CHLORIDE 0.9 % IV SOLN
INTRAVENOUS | Status: DC
  Administered 2016-04-14: 12:00:00 via INTRAVENOUS

## 2016-04-14 MED ORDER — ACYCLOVIR 400 MG PO TABS: 400.0000 mg | ORAL_TABLET | Freq: Every day | ORAL | 3 refills | Status: DC

## 2016-04-14 MED ORDER — ONDANSETRON HCL 8 MG PO TABS: 8.0000 mg | ORAL_TABLET | Freq: Two times a day (BID) | ORAL | 1 refills | Status: DC | PRN

## 2016-04-14 MED ORDER — HEPARIN SOD (PORK) LOCK FLUSH 100 UNIT/ML IV SOLN
INTRAVENOUS | Status: DC | PRN
  Administered 2016-04-14: 500 [IU] via INTRAVENOUS

## 2016-04-14 MED ORDER — LIDOCAINE HCL 1 % IJ SOLN
INTRAMUSCULAR | Status: AC | PRN
  Administered 2016-04-14: 10 mL

## 2016-04-14 MED ORDER — PROCHLORPERAZINE MALEATE 10 MG PO TABS: 10.0000 mg | ORAL_TABLET | Freq: Four times a day (QID) | ORAL | 1 refills | Status: DC | PRN

## 2016-04-14 MED ORDER — FENTANYL CITRATE (PF) 100 MCG/2ML IJ SOLN
INTRAMUSCULAR | Status: AC
  Filled 2016-04-14: qty 4

## 2016-04-14 MED ORDER — LIDOCAINE-EPINEPHRINE (PF) 2 %-1:200000 IJ SOLN
INTRAMUSCULAR | Status: AC
  Filled 2016-04-14: qty 20

## 2016-04-14 MED ORDER — LIDOCAINE HCL 1 % IJ SOLN
INTRAMUSCULAR | Status: AC
  Filled 2016-04-14: qty 20

## 2016-04-14 MED ORDER — CEFAZOLIN SODIUM-DEXTROSE 2-4 GM/100ML-% IV SOLN
2.0000 g | Freq: Once | INTRAVENOUS | Status: AC
  Administered 2016-04-14: 2 g via INTRAVENOUS
  Filled 2016-04-14: qty 100

## 2016-04-14 MED ORDER — FENTANYL CITRATE (PF) 100 MCG/2ML IJ SOLN
INTRAMUSCULAR | Status: AC | PRN
  Administered 2016-04-14: 25 ug via INTRAVENOUS
  Administered 2016-04-14: 50 ug via INTRAVENOUS
  Administered 2016-04-14: 25 ug via INTRAVENOUS

## 2016-04-14 NOTE — H&P (Signed)
Chief Complaint: multiple myeloma  Referring Physician:Dr. Burney Gauze  Supervising Physician: Daryll Brod  Patient Status: [facility] - Out-pt  HPI: Barbara Cook is an 60 y.o. female who is followed by Dr. Marin Olp for multiple myeloma.  She has been prescribed an oral agent, but she does not take it consistently as she has IBS and is afraid this will upset her stomach.  Her MM has worsened according to Dr. Antonieta Pert note.  Because she is not consistent with taking her oral agent, Dr. Marin Olp would like to transition to an IV therapy.  She presents today for placement of a PAC.  She denies any complaints.  Past Medical History:  Past Medical History:  Diagnosis Date  . Anemia   . Arthritis   . Arthropathy, unspecified, site unspecified   . Complication of anesthesia    02/13/14- had biospy in X-Ray- "twilight"  "I felt every thing"  . Constipation   . Endometriosis   . Family history of adverse reaction to anesthesia    Sister- patient will find out  . Fibroid   . GERD (gastroesophageal reflux disease)   . Heart murmur   . IBS (irritable bowel syndrome)   . MGUS (monoclonal gammopathy of unknown significance) 01-02-202013  . Multiple myeloma ([place]) 02/18/2014  . Myelitis ([place]) 2011   of bone- neck  . Neuromuscular disorder ([place])   . Polymyalgia rheumatica ([place])   . PONV (postoperative nausea and vomiting)    1990    Past Surgical History:  Past Surgical History:  Procedure Laterality Date  . COLONOSCOPY    . Fistula repair surg    . KNEE ARTHROSCOPY Left   . NECK SURGERY  2011   Myletis- bone grafting- from her left hip  . RADIOLOGY WITH ANESTHESIA N/A 08/23/2014   Procedure: T12  ABLATION       (RADIOLOGY WITH ANESTHESIA);  Surgeon: Luanne Bras, MD;  Location: [facility];  Service: Radiology;  Laterality: N/A;  . TUBAL LIGATION    . UMBILICAL HERNIA REPAIR     age 68    Family History:  Family History  Problem Relation Age of Onset  . Heart disease Mother   .  Rheum arthritis Mother   . Lung cancer Maternal Uncle   . Throat cancer Maternal Aunt   . Diabetes Maternal Grandmother   . Hypertension Maternal Grandmother   . Heart disease Maternal Grandmother   . Heart disease Paternal Grandmother   . Colon cancer Neg Hx   . Esophageal cancer Neg Hx   . Stomach cancer Neg Hx   . Rectal cancer Neg Hx     Social History:  reports that she quit smoking about 22 years ago. Her smoking use included Cigarettes. She started smoking about 27 years ago. She has a 125.00 pack-year smoking history. She has never used smokeless tobacco. She reports that she does not drink alcohol or use drugs.  Allergies:  Allergies  Allergen Reactions  . Codeine Palpitations    Medications: Medications reviewed in Epic  Please HPI for pertinent positives, otherwise complete 10 system ROS negative.  Mallampati Score: MD Evaluation Airway: WNL Heart: WNL Abdomen: WNL Chest/ Lungs: WNL ASA  Classification: 3 Mallampati/Airway Score: Two  Physical Exam: BP 126/82 (BP Location: Right Arm)   Pulse 91   Temp 97.7 F (36.5 C) (Oral)   Resp 16   Ht _0  (1.626 m)   Wt 160 lb (72.6 kg)   SpO2 99%   BMI 27.46  kg/m  Body mass index is 27.46 kg/m. General: pleasant, WD, WN black female who is laying in bed in NAD HEENT: head is normocephalic, atraumatic.  Sclera are noninjected.  PERRL.  Ears and nose without any masses or lesions.  Mouth is pink and moist Heart: regular, rate, and rhythm.  Normal s1,s2. No obvious murmurs, gallops, or rubs noted.  Palpable radial and pedal pulses bilaterally Lungs: CTAB, no wheezes, rhonchi, or rales noted.  Respiratory effort nonlabored Abd: soft, NT, ND, +BS, no masses, hernias, or organomegaly Psych: A&Ox3 with an appropriate affect.   Labs: Results for orders placed or performed during the hospital encounter of 04/14/16 (from the past 48 hour(s))  APTT     Status: None   Collection Time: 04/14/16 12:01 PM  Result Value  Ref Range   aPTT 30 24 - 36 seconds  CBC with Differential/Platelet     Status: Abnormal   Collection Time: 04/14/16 12:01 PM  Result Value Ref Range   WBC 4.4 4.0 - 10.5 K/uL   RBC 3.39 (L) 3.87 - 5.11 MIL/uL   Hemoglobin 10.1 (L) 12.0 - 15.0 g/dL   HCT 29.9 (L) 36.0 - 46.0 %   MCV 88.2 78.0 - 100.0 fL   MCH 29.8 26.0 - 34.0 pg   MCHC 33.8 30.0 - 36.0 g/dL   RDW 15.2 11.5 - 15.5 %   Platelets 181 150 - 400 K/uL   Neutrophils Relative % 43 %   Neutro Abs 1.9 1.7 - 7.7 K/uL   Lymphocytes Relative 43 %   Lymphs Abs 1.9 0.7 - 4.0 K/uL   Monocytes Relative 11 %   Monocytes Absolute 0.5 0.1 - 1.0 K/uL   Eosinophils Relative 2 %   Eosinophils Absolute 0.1 0.0 - 0.7 K/uL   Basophils Relative 1 %   Basophils Absolute 0.0 0.0 - 0.1 K/uL  Protime-INR     Status: None   Collection Time: 04/14/16 12:01 PM  Result Value Ref Range   Prothrombin Time 12.5 11.4 - 15.2 seconds   INR 0.93     Imaging: No results found.  Assessment/Plan 1. Multiple myeloma -we will plan to proceed today with placement of a PAC -labs and vitals reviewed -Risks and Benefits discussed with the patient including, but not limited to bleeding, infection, pneumothorax, or fibrin sheath development and need for additional procedures. All of the patient's questions were answered, patient is agreeable to proceed. Consent signed and in chart.    Thank you for this interesting consult.  I greatly enjoyed meeting NAVA SONG and look forward to participating in their care.  A copy of this report was sent to the requesting provider on this date.  Electronically Signed: Henreitta Cea 04/14/2016, 1:16 PM   I spent a total of  30 Minutes   in face to face in clinical consultation, greater than 50% of which was counseling/coordinating care for multiple myeloma

## 2016-04-14 NOTE — Discharge Instructions (Signed)
Implanted Port Insertion, Care After Refer to this sheet in the next few weeks. These instructions provide you with information on caring for yourself after your procedure. Your health care provider may also give you more specific instructions. Your treatment has been planned according to current medical practices, but problems sometimes occur. Call your health care provider if you have any problems or questions after your procedure. WHAT TO EXPECT AFTER THE PROCEDURE After your procedure, it is typical to have the following:   Discomfort at the port insertion site. Ice packs to the area will help.  Bruising on the skin over the port. This will subside in 3-4 days. HOME CARE INSTRUCTIONS  After your port is placed, you will get a manufacturer's information card. The card has information about your port. Keep this card with you at all times.   Know what kind of port you have. There are many types of ports available.   Wear a medical alert bracelet in case of an emergency. This can help alert health care workers that you have a port.   The port can stay in for as long as your health care provider believes it is necessary.   A home health care nurse may give medicines and take care of the port.   You or a family member can get special training and directions for giving medicine and taking care of the port at home.  SEEK MEDICAL CARE IF:   Your port does not flush or you are unable to get a blood return.   You have a fever or chills. SEEK IMMEDIATE MEDICAL CARE IF:  You have new fluid or pus coming from your incision.   You notice a bad smell coming from your incision site.   You have swelling, pain, or more redness at the incision or port site.   You have chest pain or shortness of breath. This information is not intended to replace advice given to you by your health care provider. Make sure you discuss any questions you have with your health care provider. Document  Released: 02/14/2013 Document Revised: 05/01/2013 Document Reviewed: 02/14/2013 Elsevier Interactive Patient Education  2017 [place] An implanted port is a type of central line that is placed under the skin. Central lines are used to provide IV access when treatment or nutrition needs to be given through a person's veins. Implanted ports are used for long-term IV access. An implanted port may be placed because:   You need IV medicine that would be irritating to the small veins in your hands or arms.   You need long-term IV medicines, such as antibiotics.   You need IV nutrition for a long period.   You need frequent blood draws for lab tests.   You need dialysis.  Implanted ports are usually placed in the chest area, but they can also be placed in the upper arm, the abdomen, or the leg. An implanted port has two main parts:   Reservoir. The reservoir is round and will appear as a small, raised area under your skin. The reservoir is the part where a needle is inserted to give medicines or draw blood.   Catheter. The catheter is a thin, flexible tube that extends from the reservoir. The catheter is placed into a large vein. Medicine that is inserted into the reservoir goes into the catheter and then into the vein.  HOW WILL I CARE FOR MY INCISION SITE? Do not get the incision site wet.  Bathe or shower as directed by your health care provider.  HOW IS MY PORT ACCESSED? Special steps must be taken to access the port:   Before the port is accessed, a numbing cream can be placed on the skin. This helps numb the skin over the port site.   Your health care provider uses a sterile technique to access the port.  Your health care provider must put on a mask and sterile gloves.  The skin over your port is cleaned carefully with an antiseptic and allowed to dry.  The port is gently pinched between sterile gloves, and a needle is inserted into the  port.  Only "non-coring" port needles should be used to access the port. Once the port is accessed, a blood return should be checked. This helps ensure that the port is in the vein and is not clogged.   If your port needs to remain accessed for a constant infusion, a clear (transparent) bandage will be placed over the needle site. The bandage and needle will need to be changed every week, or as directed by your health care provider.   Keep the bandage covering the needle clean and dry. Do not get it wet. Follow your health care provider's instructions on how to take a shower or bath while the port is accessed.   If your port does not need to stay accessed, no bandage is needed over the port.  WHAT IS FLUSHING? Flushing helps keep the port from getting clogged. Follow your health care provider's instructions on how and when to flush the port. Ports are usually flushed with saline solution or a medicine called heparin. The need for flushing will depend on how the port is used.   If the port is used for intermittent medicines or blood draws, the port will need to be flushed:   After medicines have been given.   After blood has been drawn.   As part of routine maintenance.   If a constant infusion is running, the port may not need to be flushed.  HOW LONG WILL MY PORT STAY IMPLANTED? The port can stay in for as long as your health care provider thinks it is needed. When it is time for the port to come out, surgery will be done to remove it. The procedure is similar to the one performed when the port was put in.  WHEN SHOULD I SEEK IMMEDIATE MEDICAL CARE? When you have an implanted port, you should seek immediate medical care if:   You notice a bad smell coming from the incision site.   You have swelling, redness, or drainage at the incision site.   You have more swelling or pain at the port site or the surrounding area.   You have a fever that is not controlled with  medicine. This information is not intended to replace advice given to you by your health care provider. Make sure you discuss any questions you have with your health care provider. Document Released: 102/11/2004 Document Revised: 02/14/2013 Document Reviewed: 01/01/2013 Elsevier Interactive Patient Education  2017 [place]. Moderate Conscious Sedation, Adult, Care After These instructions provide you with information about caring for yourself after your procedure. Your health care provider may also give you more specific instructions. Your treatment has been planned according to current medical practices, but problems sometimes occur. Call your health care provider if you have any problems or questions after your procedure. What can I expect after the procedure? After your procedure, it is common:  To feel sleepy for several hours.  To feel clumsy and have poor balance for several hours.  To have poor judgment for several hours.  To vomit if you eat too soon. Follow these instructions at home: For at least 24 hours after the procedure:   Do not:  Participate in activities where you could fall or become injured.  Drive.  Use heavy machinery.  Drink alcohol.  Take sleeping pills or medicines that cause drowsiness.  Make important decisions or sign legal documents.  Take care of children on your own.  Rest. Eating and drinking  Follow the diet recommended by your health care provider.  If you vomit:  Drink water, juice, or soup when you can drink without vomiting.  Make sure you have little or no nausea before eating solid foods. General instructions  Have a responsible adult stay with you until you are awake and alert.  Take over-the-counter and prescription medicines only as told by your health care provider.  If you smoke, do not smoke without supervision.  Keep all follow-up visits as told by your health care provider. This is important. Contact a health  care provider if:  You keep feeling nauseous or you keep vomiting.  You feel light-headed.  You develop a rash.  You have a fever. Get help right away if:  You have trouble breathing. This information is not intended to replace advice given to you by your health care provider. Make sure you discuss any questions you have with your health care provider. Document Released: 02/14/2013 Document Revised: 09/29/2015 Document Reviewed: 08/16/2015 Elsevier Interactive Patient Education  2017 [employer].

## 2016-04-14 NOTE — Procedures (Signed)
S/p RT IJ POWER PORT  TIP SVC/RA NO COMP STABLE FULL REPORT IN PACS EBL < 5CC READY FOR USE

## 2016-04-15 ENCOUNTER — Ambulatory Visit (HOSPITAL_BASED_OUTPATIENT_CLINIC_OR_DEPARTMENT_OTHER): Payer: Commercial Managed Care - HMO | Admitting: Hematology & Oncology

## 2016-04-15 ENCOUNTER — Other Ambulatory Visit (HOSPITAL_BASED_OUTPATIENT_CLINIC_OR_DEPARTMENT_OTHER): Payer: Commercial Managed Care - HMO

## 2016-04-15 ENCOUNTER — Ambulatory Visit: Payer: Commercial Managed Care - HMO

## 2016-04-15 VITALS — BP 120/68 | HR 83 | Temp 98.7°F | Resp 18 | Wt 160.0 lb

## 2016-04-15 DIAGNOSIS — C9002 Multiple myeloma in relapse: Secondary | ICD-10-CM

## 2016-04-15 MED ORDER — DOXYCYCLINE HYCLATE 100 MG PO TABS: 100.0000 mg | ORAL_TABLET | Freq: Two times a day (BID) | ORAL | 0 refills | Status: DC

## 2016-04-15 MED ORDER — FLUCONAZOLE 200 MG PO TABS: ORAL_TABLET | ORAL | 0 refills | Status: DC

## 2016-04-15 NOTE — Progress Notes (Signed)
Hematology and Oncology Follow Up Visit  Barbara Cook [id number] 1955/09/16 60 y.o. 04/15/2016   Principle Diagnosis:  IgG kappa myeloma   Current Therapy:     Velcade/Revlimid/ -Velcade every 3 week dosing  Ninlaro/Revlimid - start in 11/03/2015 Zometa 4 mg IV every 3 month - next dose in August Kyprolis - start 04/19/2016     Interim History:  Ms.  Barbara Cook is back for a follow-up. She has decided on taking the Kyprolis. I think this is a good idea. She had her Port-A-Cath placed. Unfortunately, there were some problems with accessing the Port-A-Cath today. As such, we will not start her today but start her next week.  Her myeloma studies, as expected, were increasing. Her M spike was 2.6 g/dL. Her IgG level was 4078 mg/dL. Her Kappa Lightchain was 9.4 mg/dL.  His been itself week for her and her husband. His aunt passed away. They wanted to go to the funeral in [place] but could not because of her treatment today.  We are are going to have to hold on her treatment today given the stress that she is under. Her Port-A-Cath issue really needs to be resolved.   Overall,, her performance status is ECOG 1  Medications:  Current Outpatient Prescriptions:  .  acetaminophen (TYLENOL) 500 MG tablet, Take 500 mg by mouth every 6 (six) hours as needed for mild pain or headache., Disp: , Rfl:  .  acyclovir (ZOVIRAX) 400 MG tablet, Take 1 tablet (400 mg total) by mouth daily., Disp: 30 tablet, Rfl: 3 .  hyoscyamine (LEVSIN SL) 0.125 MG SL tablet, Place 1 tablet (0.125 mg total) under the tongue every 4 (four) hours as needed., Disp: 60 tablet, Rfl: 0 .  KLOR-CON 8 MEQ tablet, 1 TABLET ONCE A DAY ORALLY 30 DAY(S), Disp: , Rfl: 6 .  lidocaine-prilocaine (EMLA) cream, Apply to affected area once, Disp: 30 g, Rfl: 3 .  LORazepam (ATIVAN) 0.5 MG tablet, Take 1 tablet (0.5 mg total) by mouth every 6 (six) hours as needed (Nausea or vomiting)., Disp: 30 tablet, Rfl: 0 .  magnesium oxide (MAG-OX)  400 (241.3 MG) MG tablet, Take 1 tablet (400 mg total) by mouth 2 (two) times daily., Disp: 60 tablet, Rfl: 3 .  Multiple Vitamin (MULTIVITAMIN) tablet, Take 1 tablet by mouth every evening. , Disp: , Rfl:  .  ondansetron (ZOFRAN) 8 MG tablet, Take 1 tablet (8 mg total) by mouth 2 (two) times daily. For nausea & vomiting. Take 1 tablet 1 hour prior to chemotherapy., Disp: 20 tablet, Rfl: 0 .  ondansetron (ZOFRAN) 8 MG tablet, Take 1 tablet (8 mg total) by mouth 2 (two) times daily as needed (Nausea or vomiting)., Disp: 30 tablet, Rfl: 1 .  pantoprazole (PROTONIX) 40 MG tablet, Take 1 tablet (40 mg total) by mouth daily., Disp: 90 tablet, Rfl: 3 .  polyethylene glycol powder (GLYCOLAX/MIRALAX) powder, 1 capful daily as needed, Disp: 255 g, Rfl: 3 .  Probiotic Product (ALIGN) 4 MG CAPS, Take 1 capsule by mouth daily., Disp: , Rfl:  .  prochlorperazine (COMPAZINE) 10 MG tablet, Take 1 tablet (10 mg total) by mouth every 6 (six) hours as needed (Nausea or vomiting)., Disp: 30 tablet, Rfl: 1 .  Pyridoxine HCl (VITAMIN B-6) 250 MG tablet, Take 1 tablet (250 mg total) by mouth daily., Disp: 30 tablet, Rfl: 6 .  spironolactone (ALDACTONE) 50 MG tablet, Take 50 mg by mouth daily., Disp: , Rfl: 4 .  torsemide (DEMADEX) 20 MG tablet, Take  1 tablet (20 mg total) by mouth daily as needed., Disp: 30 tablet, Rfl: 6 .  Vitamin D, Ergocalciferol, (DRISDOL) 50000 units CAPS capsule, TAKE 1 CAPSULE (50,000 UNITS TOTAL) BY MOUTH ONCE A WEEK., Disp: 12 capsule, Rfl: 3 .  doxycycline (VIBRA-TABS) 100 MG tablet, Take 1 tablet (100 mg total) by mouth 2 (two) times daily., Disp: 10 tablet, Rfl: 0 .  fluconazole (DIFLUCAN) 200 MG tablet, Take 1 daily if needed for yeast infection, Disp: 3 tablet, Rfl: 0  Current Facility-Administered Medications:  .  0.9 %  sodium chloride infusion, 500 mL, Intravenous, Continuous, Jerene Bears, MD  Allergies:  Allergies  Allergen Reactions  . Codeine Palpitations    Past Medical  History, Surgical history, Social history, and Family History were reviewed and updated.  Review of Systems: As above  Physical Exam:  weight is 160 lb (72.6 kg). Her oral temperature is 98.7 F (37.1 C). Her blood pressure is 120/68 and her pulse is 83. Her respiration is 18.   Well-developed and well-nourished African American female. Head and neck exam shows no ocular or oral lesions. There are no palpable cervical or supraclavicular lymph nodes.There is some tenderness to palpation just to the left of the midline at the base of her neck. I cannot palpate any lymph nodes. I cannot palpate her thyroid. She has a good carotid pulse. Lungs are clear. Cardiac exam regular rate and rhythm with no murmurs, rubs or bruits. Abdomen is soft. She is mildly obese. She really has no abdominal distention. She has no palpable liver or spleen tip. Back exam shows no tenderness over the spine, ribs or hips. Extremity shows no clubbing, cyanosis or edema. Skin exam shows no rashes, ecchymoses or petechia. Neurological exam is nonfocal.  Lab Results  Component Value Date   WBC 4.4 04/14/2016   HGB 10.1 (L) 04/14/2016   HCT 29.9 (L) 04/14/2016   MCV 88.2 04/14/2016   PLT 181 04/14/2016     Chemistry      Component Value Date/Time   NA 140 04/02/2016 0752   NA 139 02/20/2016 1207   K 3.9 04/02/2016 0752   K 3.8 02/20/2016 1207   CL 107 04/02/2016 0752   CO2 27 04/02/2016 0752   CO2 29 02/20/2016 1207   BUN 24 (H) 04/02/2016 0752   BUN 22.3 02/20/2016 1207   CREATININE 1.1 04/02/2016 0752   CREATININE 1.3 (H) 02/20/2016 1207      Component Value Date/Time   CALCIUM 9.5 04/02/2016 0752   CALCIUM 9.7 02/20/2016 1207   ALKPHOS 51 04/02/2016 0752   ALKPHOS 62 02/20/2016 1207   AST 39 (H) 04/02/2016 0752   AST 27 02/20/2016 1207   ALT 27 04/02/2016 0752   ALT 19 02/20/2016 1207   BILITOT 0.70 04/02/2016 0752   BILITOT 0.44 02/20/2016 1207         Impression and Plan: Ms. Barbara Cook is  60 year old African-American female with IgG kappa myeloma.   We will go ahead and have her come back on Monday, December 11 to start Kyprolis. We can make adjustments to her schedule for Christmas.   I really think that she should do well with the Kyprolis. I'm not going to give her high-dose Kyprolis. I want to give her standard dose for right now and maybe we can increase the dose gradually, depending on her tolerance.   I will see her back myself when she starts her second cycle of treatment in January. We will follow her myeloma  studies as a gauge for her response.   I spent about 25 minutes with her and her husband. I extended my condolences for his aunt passing.    [name], MD 12/7/20173:35 PM

## 2016-04-16 ENCOUNTER — Ambulatory Visit: Payer: Commercial Managed Care - HMO

## 2016-04-19 ENCOUNTER — Other Ambulatory Visit: Payer: Commercial Managed Care - HMO

## 2016-04-19 ENCOUNTER — Ambulatory Visit: Payer: Commercial Managed Care - HMO

## 2016-04-20 ENCOUNTER — Ambulatory Visit: Payer: Commercial Managed Care - HMO

## 2016-04-22 ENCOUNTER — Ambulatory Visit: Payer: Commercial Managed Care - HMO

## 2016-04-22 ENCOUNTER — Ambulatory Visit: Payer: Commercial Managed Care - HMO | Admitting: Hematology & Oncology

## 2016-04-22 ENCOUNTER — Other Ambulatory Visit: Payer: Commercial Managed Care - HMO

## 2016-04-23 ENCOUNTER — Ambulatory Visit: Payer: Commercial Managed Care - HMO

## 2016-04-23 ENCOUNTER — Other Ambulatory Visit: Payer: Self-pay | Admitting: *Deleted

## 2016-04-23 DIAGNOSIS — C9002 Multiple myeloma in relapse: Secondary | ICD-10-CM

## 2016-04-26 ENCOUNTER — Other Ambulatory Visit (HOSPITAL_BASED_OUTPATIENT_CLINIC_OR_DEPARTMENT_OTHER): Payer: Commercial Managed Care - HMO

## 2016-04-26 ENCOUNTER — Ambulatory Visit: Payer: Commercial Managed Care - HMO

## 2016-04-26 ENCOUNTER — Ambulatory Visit (HOSPITAL_BASED_OUTPATIENT_CLINIC_OR_DEPARTMENT_OTHER): Payer: Commercial Managed Care - HMO

## 2016-04-26 VITALS — BP 109/56 | HR 81 | Temp 98.0°F | Resp 18

## 2016-04-26 DIAGNOSIS — Z5112 Encounter for antineoplastic immunotherapy: Secondary | ICD-10-CM

## 2016-04-26 DIAGNOSIS — C9002 Multiple myeloma in relapse: Secondary | ICD-10-CM | POA: Diagnosis not present

## 2016-04-26 LAB — CBC WITH DIFFERENTIAL (CANCER CENTER ONLY)
BASO#: 0 10*3/uL (ref 0.0–0.2)
BASO%: 0.3 % (ref 0.0–2.0)
EOS%: 1.8 % (ref 0.0–7.0)
Eosinophils Absolute: 0.1 10*3/uL (ref 0.0–0.5)
HEMATOCRIT: 26.6 % — AB (ref 34.8–46.6)
HGB: 9 g/dL — ABNORMAL LOW (ref 11.6–15.9)
LYMPH#: 1.3 10*3/uL (ref 0.9–3.3)
LYMPH%: 38.5 % (ref 14.0–48.0)
MCH: 30.5 pg (ref 26.0–34.0)
MCHC: 33.8 g/dL (ref 32.0–36.0)
MCV: 90 fL (ref 81–101)
MONO#: 0.5 10*3/uL (ref 0.1–0.9)
MONO%: 15.7 % — ABNORMAL HIGH (ref 0.0–13.0)
NEUT%: 43.7 % (ref 39.6–80.0)
NEUTROS ABS: 1.5 10*3/uL (ref 1.5–6.5)
Platelets: 156 10*3/uL (ref 145–400)
RBC: 2.95 10*6/uL — AB (ref 3.70–5.32)
RDW: 14.8 % (ref 11.1–15.7)
WBC: 3.4 10*3/uL — AB (ref 3.9–10.0)

## 2016-04-26 LAB — CMP (CANCER CENTER ONLY)
ALK PHOS: 55 U/L (ref 26–84)
ALT(SGPT): 22 U/L (ref 10–47)
AST: 33 U/L (ref 11–38)
Albumin: 3.3 g/dL (ref 3.3–5.5)
BUN, Bld: 23 mg/dL — ABNORMAL HIGH (ref 7–22)
CALCIUM: 8.9 mg/dL (ref 8.0–10.3)
CHLORIDE: 108 meq/L (ref 98–108)
CO2: 27 meq/L (ref 18–33)
Creat: 1 mg/dl (ref 0.6–1.2)
GLUCOSE: 94 mg/dL (ref 73–118)
POTASSIUM: 3.8 meq/L (ref 3.3–4.7)
Sodium: 144 mEq/L (ref 128–145)
Total Bilirubin: 0.6 mg/dl (ref 0.20–1.60)
Total Protein: 9.3 g/dL — ABNORMAL HIGH (ref 6.4–8.1)

## 2016-04-26 MED ORDER — SODIUM CHLORIDE 0.9% FLUSH
10.0000 mL | INTRAVENOUS | Status: DC | PRN
  Administered 2016-04-26: 10 mL
  Filled 2016-04-26: qty 10

## 2016-04-26 MED ORDER — SODIUM CHLORIDE 0.9 % IV SOLN: Freq: Once | INTRAVENOUS | Status: DC

## 2016-04-26 MED ORDER — SODIUM CHLORIDE 0.9 % IV SOLN
Freq: Once | INTRAVENOUS | Status: AC
  Administered 2016-04-26: 11:00:00 via INTRAVENOUS

## 2016-04-26 MED ORDER — DEXTROSE 5 % IV SOLN
20.0000 mg/m2 | Freq: Once | INTRAVENOUS | Status: AC
  Administered 2016-04-26: 36 mg via INTRAVENOUS
  Filled 2016-04-26: qty 18

## 2016-04-26 MED ORDER — PROCHLORPERAZINE MALEATE 10 MG PO TABS
ORAL_TABLET | ORAL | Status: AC
  Filled 2016-04-26: qty 1

## 2016-04-26 MED ORDER — PROCHLORPERAZINE MALEATE 10 MG PO TABS
10.0000 mg | ORAL_TABLET | Freq: Once | ORAL | Status: AC
  Administered 2016-04-26: 10 mg via ORAL

## 2016-04-26 MED ORDER — HEPARIN SOD (PORK) LOCK FLUSH 100 UNIT/ML IV SOLN
500.0000 [IU] | Freq: Once | INTRAVENOUS | Status: AC | PRN
  Administered 2016-04-26: 500 [IU]
  Filled 2016-04-26: qty 5

## 2016-04-26 NOTE — Patient Instructions (Signed)
[facility] Discharge Instructions for Patients Receiving Chemotherapy  Today you received the following chemotherapy agents Kyprolis  To help prevent nausea and vomiting after your treatment, we encourage you to take your nausea medication as prescribed.  Per Dr. Marin Olp take zofran three times a day for the next 3 days.    If you develop nausea and vomiting that is not controlled by your nausea medication, call the clinic. If it is after clinic hours your family physician or the after hours number for the clinic or go to the Emergency Department.   BELOW ARE SYMPTOMS THAT SHOULD BE REPORTED IMMEDIATELY:  *FEVER GREATER THAN 100.5 F  *CHILLS WITH OR WITHOUT FEVER  NAUSEA AND VOMITING THAT IS NOT CONTROLLED WITH YOUR NAUSEA MEDICATION  *UNUSUAL SHORTNESS OF BREATH  *UNUSUAL BRUISING OR BLEEDING  TENDERNESS IN MOUTH AND THROAT WITH OR WITHOUT PRESENCE OF ULCERS  *URINARY PROBLEMS  *BOWEL PROBLEMS  UNUSUAL RASH Items with * indicate a potential emergency and should be followed up as soon as possible.  One of the nurses will contact you 24 hours after your treatment. Please let the nurse know about any problems that you may have experienced. Feel free to call the clinic you have any questions or concerns. The clinic phone number is [phone].   I have been informed and understand all the instructions given to me. I know to contact the clinic, my physician, or go to the Emergency Department if any problems should occur. I do not have any questions at this time, but understand that I may call the clinic during office hours   should I have any questions or need assistance in obtaining follow up care.    __________________________________________  _____________  __________ Signature of Patient or Authorized Representative            Date                   Time    __________________________________________ Nurse's Signature

## 2016-04-27 ENCOUNTER — Encounter: Payer: Self-pay | Admitting: Hematology & Oncology

## 2016-04-27 ENCOUNTER — Ambulatory Visit (HOSPITAL_BASED_OUTPATIENT_CLINIC_OR_DEPARTMENT_OTHER): Payer: Commercial Managed Care - HMO

## 2016-04-27 VITALS — BP 103/53 | HR 77 | Temp 97.6°F | Resp 18

## 2016-04-27 DIAGNOSIS — Z5112 Encounter for antineoplastic immunotherapy: Secondary | ICD-10-CM | POA: Diagnosis not present

## 2016-04-27 DIAGNOSIS — C9002 Multiple myeloma in relapse: Secondary | ICD-10-CM

## 2016-04-27 MED ORDER — SODIUM CHLORIDE 0.9 % IV SOLN
Freq: Once | INTRAVENOUS | Status: AC
  Administered 2016-04-27: 09:00:00 via INTRAVENOUS

## 2016-04-27 MED ORDER — PROCHLORPERAZINE MALEATE 10 MG PO TABS: 10.0000 mg | ORAL_TABLET | Freq: Once | ORAL | Status: DC

## 2016-04-27 MED ORDER — ACETAMINOPHEN 325 MG PO TABS
650.0000 mg | ORAL_TABLET | Freq: Once | ORAL | Status: AC
  Administered 2016-04-27: 650 mg via ORAL

## 2016-04-27 MED ORDER — SODIUM CHLORIDE 0.9 % IV SOLN: Freq: Once | INTRAVENOUS | Status: AC

## 2016-04-27 MED ORDER — ACETAMINOPHEN 325 MG PO TABS
ORAL_TABLET | ORAL | Status: AC
  Filled 2016-04-27: qty 2

## 2016-04-27 MED ORDER — DEXTROSE 5 % IV SOLN
20.0000 mg/m2 | Freq: Once | INTRAVENOUS | Status: AC
  Administered 2016-04-27: 36 mg via INTRAVENOUS
  Filled 2016-04-27: qty 18

## 2016-04-27 NOTE — Patient Instructions (Signed)
Carfilzomib injection What is this medicine? CARFILZOMIB (kar FILZ oh mib) targets a specific protein within cancer cells and stops the cancer cells from growing. It is used to treat multiple myeloma. COMMON BRAND NAME(S): KYPROLIS What should I tell my health care provider before I take this medicine? They need to know if you have any of these conditions: -heart disease -history of blood clots -irregular heartbeat -kidney disease -liver disease -lung or breathing disease -an unusual or allergic reaction to carfilzomib, or other medicines, foods, dyes, or preservatives -pregnant or trying to get pregnant -breast-feeding How should I use this medicine? This medicine is for injection or infusion into a vein. It is given by a health care professional in a hospital or clinic setting. Talk to your pediatrician regarding the use of this medicine in children. Special care may be needed. What if I miss a dose? It is important not to miss your dose. Call your doctor or health care professional if you are unable to keep an appointment. What may interact with this medicine? Interactions are not expected. Give your health care provider a list of all the medicines, herbs, non-prescription drugs, or dietary supplements you use. Also tell them if you smoke, drink alcohol, or use illegal drugs. Some items may interact with your medicine. What should I watch for while using this medicine? Your condition will be monitored carefully while you are receiving this medicine. Report any side effects. Continue your course of treatment even though you feel ill unless your doctor tells you to stop. You may need blood work done while you are taking this medicine. Do not become pregnant while taking this medicine or for at least 30 days after stopping it. Women should inform their doctor if they wish to become pregnant or think they might be pregnant. There is a potential for serious side effects to an unborn child.  Men should not father a child while taking this medicine and for 90 days after stopping it. Talk to your health care professional or pharmacist for more information. Do not breast-feed an infant while taking this medicine. Check with your doctor or health care professional if you get an attack of severe diarrhea, nausea and vomiting, or if you sweat a lot. The loss of too much body fluid can make it dangerous for you to take this medicine. You may get dizzy. Do not drive, use machinery, or do anything that needs mental alertness until you know how this medicine affects you. Do not stand or sit up quickly, especially if you are an older patient. This reduces the risk of dizzy or fainting spells. What side effects may I notice from receiving this medicine? Side effects that you should report to your doctor or health care professional as soon as possible: -allergic reactions like skin rash, itching or hives, swelling of the face, lips, or tongue -confusion -dizziness -feeling faint or lightheaded -fever or chills -palpitations -seizures -signs and symptoms of bleeding such as bloody or black, tarry stools; red or dark-brown urine; spitting up blood or brown material that looks like coffee grounds; red spots on the skin; unusual bruising or bleeding including from the eye, gums, or nose -signs and symptoms of a blood clot such as breathing problems; changes in vision; chest pain; severe, sudden headache; pain, swelling, warmth in the leg; trouble speaking; sudden numbness or weakness of the face, arm or leg -signs and symptoms of kidney injury like trouble passing urine or change in the amount of urine -signs and  symptoms of liver injury like dark yellow or brown urine; general ill feeling or flu-like symptoms; light-colored stools; loss of appetite; nausea; right upper belly pain; unusually weak or tired; yellowing of the eyes or skin Side effects that usually do not require medical attention (report to  your doctor or health care professional if they continue or are bothersome): -back pain -cough -diarrhea -headache -muscle cramps -vomiting Where should I keep my medicine? This drug is given in a hospital or clinic and will not be stored at home.  2017 Elsevier/Gold Standard (2015-05-29 13:39:23)

## 2016-04-27 NOTE — Progress Notes (Signed)
Pt reports headache beginning last pm "around supper time" after 1st Kyprolis yesterday. Took 500mg  Tylenol at 0530 this am.   Per Arbie Cookey, pharmacist, 22% of patients in study reported headache. Tylenol 650mg  given prior to treatment with patient instructions to take 500mg  at supper tonight. Tylenol to be added to patient's care plan for future treatments.  Pt agrees and verbalizes understanding. dph

## 2016-04-29 ENCOUNTER — Ambulatory Visit: Payer: Commercial Managed Care - HMO

## 2016-04-29 ENCOUNTER — Other Ambulatory Visit: Payer: Commercial Managed Care - HMO

## 2016-04-29 ENCOUNTER — Ambulatory Visit: Payer: Commercial Managed Care - HMO | Admitting: Hematology & Oncology

## 2016-04-30 ENCOUNTER — Ambulatory Visit: Payer: Commercial Managed Care - HMO

## 2016-05-04 ENCOUNTER — Other Ambulatory Visit: Payer: Self-pay | Admitting: *Deleted

## 2016-05-04 DIAGNOSIS — C9002 Multiple myeloma in relapse: Secondary | ICD-10-CM

## 2016-05-05 ENCOUNTER — Other Ambulatory Visit (HOSPITAL_BASED_OUTPATIENT_CLINIC_OR_DEPARTMENT_OTHER): Payer: Commercial Managed Care - HMO

## 2016-05-05 ENCOUNTER — Ambulatory Visit (HOSPITAL_BASED_OUTPATIENT_CLINIC_OR_DEPARTMENT_OTHER): Payer: Commercial Managed Care - HMO

## 2016-05-05 VITALS — BP 124/57 | HR 89 | Temp 97.3°F | Resp 20

## 2016-05-05 DIAGNOSIS — Z5112 Encounter for antineoplastic immunotherapy: Secondary | ICD-10-CM

## 2016-05-05 DIAGNOSIS — C9002 Multiple myeloma in relapse: Secondary | ICD-10-CM

## 2016-05-05 LAB — CMP (CANCER CENTER ONLY)
ALT(SGPT): 28 U/L (ref 10–47)
AST: 31 U/L (ref 11–38)
Albumin: 3.2 g/dL — ABNORMAL LOW (ref 3.3–5.5)
Alkaline Phosphatase: 52 U/L (ref 26–84)
BUN: 22 mg/dL (ref 7–22)
CHLORIDE: 106 meq/L (ref 98–108)
CO2: 27 meq/L (ref 18–33)
CREATININE: 0.8 mg/dL (ref 0.6–1.2)
Calcium: 9 mg/dL (ref 8.0–10.3)
GLUCOSE: 107 mg/dL (ref 73–118)
POTASSIUM: 3.9 meq/L (ref 3.3–4.7)
SODIUM: 142 meq/L (ref 128–145)
Total Bilirubin: 0.6 mg/dl (ref 0.20–1.60)
Total Protein: 8.8 g/dL — ABNORMAL HIGH (ref 6.4–8.1)

## 2016-05-05 LAB — CBC WITH DIFFERENTIAL (CANCER CENTER ONLY)
BASO#: 0 10*3/uL (ref 0.0–0.2)
BASO%: 0.3 % (ref 0.0–2.0)
EOS ABS: 0.1 10*3/uL (ref 0.0–0.5)
EOS%: 3.4 % (ref 0.0–7.0)
HCT: 27.1 % — ABNORMAL LOW (ref 34.8–46.6)
HGB: 9.2 g/dL — ABNORMAL LOW (ref 11.6–15.9)
LYMPH#: 1.3 10*3/uL (ref 0.9–3.3)
LYMPH%: 41.7 % (ref 14.0–48.0)
MCH: 30.5 pg (ref 26.0–34.0)
MCHC: 33.9 g/dL (ref 32.0–36.0)
MCV: 90 fL (ref 81–101)
MONO#: 0.5 10*3/uL (ref 0.1–0.9)
MONO%: 14.1 % — AB (ref 0.0–13.0)
NEUT#: 1.3 10*3/uL — ABNORMAL LOW (ref 1.5–6.5)
NEUT%: 40.5 % (ref 39.6–80.0)
PLATELETS: 127 10*3/uL — AB (ref 145–400)
RBC: 3.02 10*6/uL — ABNORMAL LOW (ref 3.70–5.32)
RDW: 14.8 % (ref 11.1–15.7)
WBC: 3.2 10*3/uL — AB (ref 3.9–10.0)

## 2016-05-05 MED ORDER — DEXTROSE 5 % IV SOLN
20.0000 mg/m2 | Freq: Once | INTRAVENOUS | Status: AC
  Administered 2016-05-05: 36 mg via INTRAVENOUS
  Filled 2016-05-05: qty 18

## 2016-05-05 MED ORDER — PROCHLORPERAZINE MALEATE 10 MG PO TABS: 10.0000 mg | ORAL_TABLET | Freq: Once | ORAL | Status: DC

## 2016-05-05 MED ORDER — PROCHLORPERAZINE MALEATE 10 MG PO TABS
ORAL_TABLET | ORAL | Status: AC
  Filled 2016-05-05: qty 1

## 2016-05-05 MED ORDER — SODIUM CHLORIDE 0.9 % IV SOLN: Freq: Once | INTRAVENOUS | Status: DC

## 2016-05-05 MED ORDER — SODIUM CHLORIDE 0.9% FLUSH
10.0000 mL | INTRAVENOUS | Status: DC | PRN
  Administered 2016-05-05: 10 mL
  Filled 2016-05-05: qty 10

## 2016-05-05 MED ORDER — SODIUM CHLORIDE 0.9 % IV SOLN
Freq: Once | INTRAVENOUS | Status: AC
  Administered 2016-05-05: 11:00:00 via INTRAVENOUS

## 2016-05-05 MED ORDER — HEPARIN SOD (PORK) LOCK FLUSH 100 UNIT/ML IV SOLN
500.0000 [IU] | Freq: Once | INTRAVENOUS | Status: AC | PRN
  Administered 2016-05-05: 500 [IU]
  Filled 2016-05-05: qty 5

## 2016-05-05 NOTE — Progress Notes (Signed)
OK to treat with today's labs per Dr. Ennever.  

## 2016-05-05 NOTE — Patient Instructions (Signed)
Carfilzomib injection What is this medicine? CARFILZOMIB (kar FILZ oh mib) targets a specific protein within cancer cells and stops the cancer cells from growing. It is used to treat multiple myeloma. COMMON BRAND NAME(S): KYPROLIS What should I tell my health care provider before I take this medicine? They need to know if you have any of these conditions: -heart disease -history of blood clots -irregular heartbeat -kidney disease -liver disease -lung or breathing disease -an unusual or allergic reaction to carfilzomib, or other medicines, foods, dyes, or preservatives -pregnant or trying to get pregnant -breast-feeding How should I use this medicine? This medicine is for injection or infusion into a vein. It is given by a health care professional in a hospital or clinic setting. Talk to your pediatrician regarding the use of this medicine in children. Special care may be needed. What if I miss a dose? It is important not to miss your dose. Call your doctor or health care professional if you are unable to keep an appointment. What may interact with this medicine? Interactions are not expected. Give your health care provider a list of all the medicines, herbs, non-prescription drugs, or dietary supplements you use. Also tell them if you smoke, drink alcohol, or use illegal drugs. Some items may interact with your medicine. What should I watch for while using this medicine? Your condition will be monitored carefully while you are receiving this medicine. Report any side effects. Continue your course of treatment even though you feel ill unless your doctor tells you to stop. You may need blood work done while you are taking this medicine. Do not become pregnant while taking this medicine or for at least 30 days after stopping it. Women should inform their doctor if they wish to become pregnant or think they might be pregnant. There is a potential for serious side effects to an unborn child.  Men should not father a child while taking this medicine and for 90 days after stopping it. Talk to your health care professional or pharmacist for more information. Do not breast-feed an infant while taking this medicine. Check with your doctor or health care professional if you get an attack of severe diarrhea, nausea and vomiting, or if you sweat a lot. The loss of too much body fluid can make it dangerous for you to take this medicine. You may get dizzy. Do not drive, use machinery, or do anything that needs mental alertness until you know how this medicine affects you. Do not stand or sit up quickly, especially if you are an older patient. This reduces the risk of dizzy or fainting spells. What side effects may I notice from receiving this medicine? Side effects that you should report to your doctor or health care professional as soon as possible: -allergic reactions like skin rash, itching or hives, swelling of the face, lips, or tongue -confusion -dizziness -feeling faint or lightheaded -fever or chills -palpitations -seizures -signs and symptoms of bleeding such as bloody or black, tarry stools; red or dark-brown urine; spitting up blood or brown material that looks like coffee grounds; red spots on the skin; unusual bruising or bleeding including from the eye, gums, or nose -signs and symptoms of a blood clot such as breathing problems; changes in vision; chest pain; severe, sudden headache; pain, swelling, warmth in the leg; trouble speaking; sudden numbness or weakness of the face, arm or leg -signs and symptoms of kidney injury like trouble passing urine or change in the amount of urine -signs and   symptoms of liver injury like dark yellow or brown urine; general ill feeling or flu-like symptoms; light-colored stools; loss of appetite; nausea; right upper belly pain; unusually weak or tired; yellowing of the eyes or skin Side effects that usually do not require medical attention (report to  your doctor or health care professional if they continue or are bothersome): -back pain -cough -diarrhea -headache -muscle cramps -vomiting Where should I keep my medicine? This drug is given in a hospital or clinic and will not be stored at home.  2017 Elsevier/Gold Standard (2015-05-29 13:39:23)  

## 2016-05-06 ENCOUNTER — Ambulatory Visit (HOSPITAL_BASED_OUTPATIENT_CLINIC_OR_DEPARTMENT_OTHER): Payer: Commercial Managed Care - HMO

## 2016-05-06 VITALS — BP 110/51 | HR 82 | Temp 97.3°F | Resp 20

## 2016-05-06 DIAGNOSIS — C9002 Multiple myeloma in relapse: Secondary | ICD-10-CM

## 2016-05-06 DIAGNOSIS — Z5112 Encounter for antineoplastic immunotherapy: Secondary | ICD-10-CM | POA: Diagnosis not present

## 2016-05-06 MED ORDER — DEXTROSE 5 % IV SOLN
20.0000 mg/m2 | Freq: Once | INTRAVENOUS | Status: AC
  Administered 2016-05-06: 36 mg via INTRAVENOUS
  Filled 2016-05-06: qty 18

## 2016-05-06 MED ORDER — HEPARIN SOD (PORK) LOCK FLUSH 100 UNIT/ML IV SOLN
500.0000 [IU] | Freq: Once | INTRAVENOUS | Status: AC | PRN
  Administered 2016-05-06: 500 [IU]
  Filled 2016-05-06: qty 5

## 2016-05-06 MED ORDER — SODIUM CHLORIDE 0.9% FLUSH
10.0000 mL | INTRAVENOUS | Status: DC | PRN
  Administered 2016-05-06: 10 mL
  Filled 2016-05-06: qty 10

## 2016-05-06 MED ORDER — ACETAMINOPHEN 325 MG PO TABS: 650.0000 mg | ORAL_TABLET | Freq: Once | ORAL | Status: DC

## 2016-05-06 MED ORDER — PROCHLORPERAZINE MALEATE 10 MG PO TABS: 10.0000 mg | ORAL_TABLET | Freq: Once | ORAL | Status: DC

## 2016-05-06 MED ORDER — SODIUM CHLORIDE 0.9 % IV SOLN
Freq: Once | INTRAVENOUS | Status: AC
  Administered 2016-05-06: 11:00:00 via INTRAVENOUS

## 2016-05-06 NOTE — Patient Instructions (Signed)
[facility] Discharge Instructions for Patients Receiving Chemotherapy  Today you received the following chemotherapy agents Kyprolis  To help prevent nausea and vomiting after your treatment, we encourage you to take your nausea medication as prescribed.  Per Dr. Marin Olp take zofran three times a day for the next 3 days.    If you develop nausea and vomiting that is not controlled by your nausea medication, call the clinic. If it is after clinic hours your family physician or the after hours number for the clinic or go to the Emergency Department.   BELOW ARE SYMPTOMS THAT SHOULD BE REPORTED IMMEDIATELY:  *FEVER GREATER THAN 100.5 F  *CHILLS WITH OR WITHOUT FEVER  NAUSEA AND VOMITING THAT IS NOT CONTROLLED WITH YOUR NAUSEA MEDICATION  *UNUSUAL SHORTNESS OF BREATH  *UNUSUAL BRUISING OR BLEEDING  TENDERNESS IN MOUTH AND THROAT WITH OR WITHOUT PRESENCE OF ULCERS  *URINARY PROBLEMS  *BOWEL PROBLEMS  UNUSUAL RASH Items with * indicate a potential emergency and should be followed up as soon as possible.  One of the nurses will contact you 24 hours after your treatment. Please let the nurse know about any problems that you may have experienced. Feel free to call the clinic you have any questions or concerns. The clinic phone number is [phone].   I have been informed and understand all the instructions given to me. I know to contact the clinic, my physician, or go to the Emergency Department if any problems should occur. I do not have any questions at this time, but understand that I may call the clinic during office hours   should I have any questions or need assistance in obtaining follow up care.    __________________________________________  _____________  __________ Signature of Patient or Authorized Representative            Date                   Time    __________________________________________ Nurse's Signature

## 2016-05-12 ENCOUNTER — Encounter: Payer: Self-pay | Admitting: Hematology & Oncology

## 2016-05-17 ENCOUNTER — Other Ambulatory Visit (HOSPITAL_COMMUNITY): Payer: Self-pay | Admitting: Pharmacist

## 2016-05-17 ENCOUNTER — Ambulatory Visit (HOSPITAL_BASED_OUTPATIENT_CLINIC_OR_DEPARTMENT_OTHER): Payer: Commercial Managed Care - HMO | Admitting: Hematology & Oncology

## 2016-05-17 ENCOUNTER — Other Ambulatory Visit (HOSPITAL_BASED_OUTPATIENT_CLINIC_OR_DEPARTMENT_OTHER): Payer: Commercial Managed Care - HMO

## 2016-05-17 ENCOUNTER — Ambulatory Visit (HOSPITAL_BASED_OUTPATIENT_CLINIC_OR_DEPARTMENT_OTHER): Payer: Commercial Managed Care - HMO

## 2016-05-17 ENCOUNTER — Encounter: Payer: Self-pay | Admitting: Hematology & Oncology

## 2016-05-17 VITALS — BP 139/69 | HR 93 | Temp 97.4°F | Wt 169.0 lb

## 2016-05-17 DIAGNOSIS — C9002 Multiple myeloma in relapse: Secondary | ICD-10-CM

## 2016-05-17 DIAGNOSIS — Z5112 Encounter for antineoplastic immunotherapy: Secondary | ICD-10-CM | POA: Diagnosis not present

## 2016-05-17 LAB — CMP (CANCER CENTER ONLY)
ALK PHOS: 57 U/L (ref 26–84)
ALT: 28 U/L (ref 10–47)
AST: 31 U/L (ref 11–38)
Albumin: 3.1 g/dL — ABNORMAL LOW (ref 3.3–5.5)
BILIRUBIN TOTAL: 0.7 mg/dL (ref 0.20–1.60)
BUN, Bld: 20 mg/dL (ref 7–22)
CALCIUM: 8.8 mg/dL (ref 8.0–10.3)
CO2: 24 meq/L (ref 18–33)
Chloride: 109 mEq/L — ABNORMAL HIGH (ref 98–108)
Creat: 0.9 mg/dl (ref 0.6–1.2)
GLUCOSE: 107 mg/dL (ref 73–118)
Potassium: 3.8 mEq/L (ref 3.3–4.7)
SODIUM: 140 meq/L (ref 128–145)
Total Protein: 7.7 g/dL (ref 6.4–8.1)

## 2016-05-17 LAB — CBC WITH DIFFERENTIAL (CANCER CENTER ONLY)
BASO#: 0 10*3/uL (ref 0.0–0.2)
BASO%: 0.7 % (ref 0.0–2.0)
EOS%: 2 % (ref 0.0–7.0)
Eosinophils Absolute: 0.1 10*3/uL (ref 0.0–0.5)
HEMATOCRIT: 26.5 % — AB (ref 34.8–46.6)
HGB: 8.8 g/dL — ABNORMAL LOW (ref 11.6–15.9)
LYMPH#: 1.1 10*3/uL (ref 0.9–3.3)
LYMPH%: 37.3 % (ref 14.0–48.0)
MCH: 30.3 pg (ref 26.0–34.0)
MCHC: 33.2 g/dL (ref 32.0–36.0)
MCV: 91 fL (ref 81–101)
MONO#: 0.5 10*3/uL (ref 0.1–0.9)
MONO%: 15.9 % — AB (ref 0.0–13.0)
NEUT%: 44.1 % (ref 39.6–80.0)
NEUTROS ABS: 1.3 10*3/uL — AB (ref 1.5–6.5)
Platelets: 142 10*3/uL — ABNORMAL LOW (ref 145–400)
RBC: 2.9 10*6/uL — ABNORMAL LOW (ref 3.70–5.32)
RDW: 15.2 % (ref 11.1–15.7)
WBC: 3 10*3/uL — ABNORMAL LOW (ref 3.9–10.0)

## 2016-05-17 MED ORDER — ACETAMINOPHEN 325 MG PO TABS: 650.0000 mg | ORAL_TABLET | Freq: Once | ORAL | Status: DC

## 2016-05-17 MED ORDER — DEXTROSE 5 % IV SOLN
20.0000 mg/m2 | Freq: Once | INTRAVENOUS | Status: AC
  Administered 2016-05-17: 36 mg via INTRAVENOUS
  Filled 2016-05-17: qty 18

## 2016-05-17 MED ORDER — SODIUM CHLORIDE 0.9% FLUSH
10.0000 mL | INTRAVENOUS | Status: DC | PRN
  Administered 2016-05-17: 10 mL
  Filled 2016-05-17: qty 10

## 2016-05-17 MED ORDER — PROCHLORPERAZINE MALEATE 10 MG PO TABS: 10.0000 mg | ORAL_TABLET | Freq: Once | ORAL | Status: DC

## 2016-05-17 MED ORDER — HEPARIN SOD (PORK) LOCK FLUSH 100 UNIT/ML IV SOLN
500.0000 [IU] | Freq: Once | INTRAVENOUS | Status: AC | PRN
  Administered 2016-05-17: 500 [IU]
  Filled 2016-05-17: qty 5

## 2016-05-17 MED ORDER — SODIUM CHLORIDE 0.9 % IV SOLN
Freq: Once | INTRAVENOUS | Status: AC
  Administered 2016-05-17: 09:00:00 via INTRAVENOUS

## 2016-05-17 MED ORDER — ACETAMINOPHEN 325 MG PO TABS
ORAL_TABLET | ORAL | Status: AC
  Filled 2016-05-17: qty 2

## 2016-05-17 MED ORDER — PROCHLORPERAZINE MALEATE 10 MG PO TABS
ORAL_TABLET | ORAL | Status: AC
  Filled 2016-05-17: qty 1

## 2016-05-17 NOTE — Progress Notes (Signed)
Ok to treat with Neut # 1.3 and plt 142 per Dr. Marin Olp

## 2016-05-17 NOTE — Patient Instructions (Signed)
[facility] Discharge Instructions for Patients Receiving Chemotherapy  Today you received the following chemotherapy agents Kyprolis  To help prevent nausea and vomiting after your treatment, we encourage you to take your nausea medication as prescribed.  Per Dr. Marin Olp take zofran three times a day for the next 3 days.    If you develop nausea and vomiting that is not controlled by your nausea medication, call the clinic. If it is after clinic hours your family physician or the after hours number for the clinic or go to the Emergency Department.   BELOW ARE SYMPTOMS THAT SHOULD BE REPORTED IMMEDIATELY:  *FEVER GREATER THAN 100.5 F  *CHILLS WITH OR WITHOUT FEVER  NAUSEA AND VOMITING THAT IS NOT CONTROLLED WITH YOUR NAUSEA MEDICATION  *UNUSUAL SHORTNESS OF BREATH  *UNUSUAL BRUISING OR BLEEDING  TENDERNESS IN MOUTH AND THROAT WITH OR WITHOUT PRESENCE OF ULCERS  *URINARY PROBLEMS  *BOWEL PROBLEMS  UNUSUAL RASH Items with * indicate a potential emergency and should be followed up as soon as possible.  One of the nurses will contact you 24 hours after your treatment. Please let the nurse know about any problems that you may have experienced. Feel free to call the clinic you have any questions or concerns. The clinic phone number is [phone].   I have been informed and understand all the instructions given to me. I know to contact the clinic, my physician, or go to the Emergency Department if any problems should occur. I do not have any questions at this time, but understand that I may call the clinic during office hours   should I have any questions or need assistance in obtaining follow up care.    __________________________________________  _____________  __________ Signature of Patient or Authorized Representative            Date                   Time    __________________________________________ Nurse's Signature

## 2016-05-17 NOTE — Progress Notes (Signed)
Hematology and Oncology Follow Up Visit  Barbara Cook [id number] 08-Jan-1956 60 y.o. 05/17/2016   Principle Diagnosis:  IgG kappa myeloma   Current Therapy:     Velcade/Revlimid/ -Velcade every 3 week dosing  Ninlaro/Revlimid - start in 11/03/2015 Zometa 4 mg IV every 3 month - next dose in August Kyprolis - start 04/19/2016 - s/p cycle #1     Interim History:  [name] is back for a follow-up. She really is doing quite well. She has tolerated Kyprolis in [place] well. She's not had any diarrhea.  We are holding Zometa for right now. She is not complaining of any bony pain. I don't want to "rock the boat" for right now.  With 1 cycle of treatment, she really has responded. Her total protein has been coming down nicely.  She's had no possible cough or shortness of breath. There's been no issues with leg swelling. She's had no rashes.   She does have a very low erythropoietin level. As such, which always give her Aranesp for the anemia. She is not symptomatic with the anemia.   Her Overall, her performance status is ECOG 1  Medications:  Current Outpatient Prescriptions:  .  acetaminophen (TYLENOL) 500 MG tablet, Take 500 mg by mouth every 6 (six) hours as needed for mild pain or headache., Disp: , Rfl:  .  acyclovir (ZOVIRAX) 400 MG tablet, Take 1 tablet (400 mg total) by mouth daily., Disp: 30 tablet, Rfl: 3 .  doxycycline (VIBRA-TABS) 100 MG tablet, Take 1 tablet (100 mg total) by mouth 2 (two) times daily., Disp: 10 tablet, Rfl: 0 .  fluconazole (DIFLUCAN) 200 MG tablet, Take 1 daily if needed for yeast infection, Disp: 3 tablet, Rfl: 0 .  hyoscyamine (LEVSIN SL) 0.125 MG SL tablet, Place 1 tablet (0.125 mg total) under the tongue every 4 (four) hours as needed., Disp: 60 tablet, Rfl: 0 .  KLOR-CON 8 MEQ tablet, 1 TABLET ONCE A DAY ORALLY 30 DAY(S), Disp: , Rfl: 6 .  lidocaine-prilocaine (EMLA) cream, Apply to affected area once, Disp: 30 g, Rfl: 3 .  LORazepam (ATIVAN)  0.5 MG tablet, Take 1 tablet (0.5 mg total) by mouth every 6 (six) hours as needed (Nausea or vomiting)., Disp: 30 tablet, Rfl: 0 .  magnesium oxide (MAG-OX) 400 (241.3 MG) MG tablet, Take 1 tablet (400 mg total) by mouth 2 (two) times daily., Disp: 60 tablet, Rfl: 3 .  Multiple Vitamin (MULTIVITAMIN) tablet, Take 1 tablet by mouth every evening. , Disp: , Rfl:  .  ondansetron (ZOFRAN) 8 MG tablet, Take 1 tablet (8 mg total) by mouth 2 (two) times daily. For nausea & vomiting. Take 1 tablet 1 hour prior to chemotherapy., Disp: 20 tablet, Rfl: 0 .  ondansetron (ZOFRAN) 8 MG tablet, Take 1 tablet (8 mg total) by mouth 2 (two) times daily as needed (Nausea or vomiting)., Disp: 30 tablet, Rfl: 1 .  pantoprazole (PROTONIX) 40 MG tablet, Take 1 tablet (40 mg total) by mouth daily., Disp: 90 tablet, Rfl: 3 .  polyethylene glycol powder (GLYCOLAX/MIRALAX) powder, 1 capful daily as needed, Disp: 255 g, Rfl: 3 .  Probiotic Product (ALIGN) 4 MG CAPS, Take 1 capsule by mouth daily., Disp: , Rfl:  .  prochlorperazine (COMPAZINE) 10 MG tablet, Take 1 tablet (10 mg total) by mouth every 6 (six) hours as needed (Nausea or vomiting)., Disp: 30 tablet, Rfl: 1 .  Pyridoxine HCl (VITAMIN B-6) 250 MG tablet, Take 1 tablet (250 mg total) by  mouth daily., Disp: 30 tablet, Rfl: 6 .  spironolactone (ALDACTONE) 50 MG tablet, Take 50 mg by mouth daily., Disp: , Rfl: 4 .  torsemide (DEMADEX) 20 MG tablet, Take 1 tablet (20 mg total) by mouth daily as needed., Disp: 30 tablet, Rfl: 6 .  Vitamin D, Ergocalciferol, (DRISDOL) 50000 units CAPS capsule, TAKE 1 CAPSULE (50,000 UNITS TOTAL) BY MOUTH ONCE A WEEK., Disp: 12 capsule, Rfl: 3  Current Facility-Administered Medications:  .  0.9 %  sodium chloride infusion, 500 mL, Intravenous, Continuous, Jerene Bears, MD  Allergies:  Allergies  Allergen Reactions  . Codeine Palpitations    Past Medical History, Surgical history, Social history, and Family History were reviewed and  updated.  Review of Systems: As above  Physical Exam:  weight is 169 lb (76.7 kg). Her oral temperature is 97.4 F (36.3 C). Her blood pressure is 139/69 and her pulse is 93.   Well-developed and well-nourished African American female. Head and neck exam shows no ocular or oral lesions. There are no palpable cervical or supraclavicular lymph nodes.There is some tenderness to palpation just to the left of the midline at the base of her neck. I cannot palpate any lymph nodes. I cannot palpate her thyroid. She has a good carotid pulse. Lungs are clear. Cardiac exam regular rate and rhythm with no murmurs, rubs or bruits. Abdomen is soft. She is mildly obese. She really has no abdominal distention. She has no palpable liver or spleen tip. Back exam shows no tenderness over the spine, ribs or hips. Extremity shows no clubbing, cyanosis or edema. Skin exam shows no rashes, ecchymoses or petechia. Neurological exam is nonfocal.  Lab Results  Component Value Date   WBC 3.0 (L) 05/17/2016   HGB 8.8 (L) 05/17/2016   HCT 26.5 (L) 05/17/2016   MCV 91 05/17/2016   PLT 142 (L) 05/17/2016     Chemistry      Component Value Date/Time   NA 140 05/17/2016 0803   NA 139 02/20/2016 1207   K 3.8 05/17/2016 0803   K 3.8 02/20/2016 1207   CL 109 (H) 05/17/2016 0803   CO2 24 05/17/2016 0803   CO2 29 02/20/2016 1207   BUN 20 05/17/2016 0803   BUN 22.3 02/20/2016 1207   CREATININE 0.9 05/17/2016 0803   CREATININE 1.3 (H) 02/20/2016 1207      Component Value Date/Time   CALCIUM 8.8 05/17/2016 0803   CALCIUM 9.7 02/20/2016 1207   ALKPHOS 57 05/17/2016 0803   ALKPHOS 62 02/20/2016 1207   AST 31 05/17/2016 0803   AST 27 02/20/2016 1207   ALT 28 05/17/2016 0803   ALT 19 02/20/2016 1207   BILITOT 0.70 05/17/2016 0803   BILITOT 0.44 02/20/2016 1207         Impression and Plan: Barbara Cook is 61 year old African-American female with IgG kappa myeloma.   For now, I will not increase the dose of  Kyprolis. I'll like to just keep her on the initial dose. I don't want to cause too much disruption. She really is doing nicely. I am encouraged by this. Again, her total protein has come down to 7.7. This has to indicate that her M spike will also be down.  We will plan to see her back in one month.  If her anemia somehow worsens, then I will see about adding Aranesp.   [name], MD 1/8/20188:56 AM

## 2016-05-18 ENCOUNTER — Ambulatory Visit (HOSPITAL_BASED_OUTPATIENT_CLINIC_OR_DEPARTMENT_OTHER): Payer: Commercial Managed Care - HMO

## 2016-05-18 VITALS — BP 105/58 | HR 80 | Temp 97.8°F | Resp 18

## 2016-05-18 DIAGNOSIS — Z5112 Encounter for antineoplastic immunotherapy: Secondary | ICD-10-CM

## 2016-05-18 DIAGNOSIS — C9002 Multiple myeloma in relapse: Secondary | ICD-10-CM | POA: Diagnosis not present

## 2016-05-18 LAB — KAPPA/LAMBDA LIGHT CHAINS
Ig Kappa Free Light Chain: 27.7 mg/L — ABNORMAL HIGH (ref 3.3–19.4)
Ig Lambda Free Light Chain: 9.3 mg/L (ref 5.7–26.3)
Kappa/Lambda FluidC Ratio: 2.98 — ABNORMAL HIGH (ref 0.26–1.65)

## 2016-05-18 LAB — IGG, IGA, IGM
IGA/IMMUNOGLOBULIN A, SERUM: 89 mg/dL (ref 87–352)
IgM, Qn, Serum: 19 mg/dL — ABNORMAL LOW (ref 26–217)

## 2016-05-18 MED ORDER — SODIUM CHLORIDE 0.9 % IV SOLN
Freq: Once | INTRAVENOUS | Status: AC
  Administered 2016-05-18: 09:00:00 via INTRAVENOUS

## 2016-05-18 MED ORDER — PROCHLORPERAZINE MALEATE 10 MG PO TABS: 10.0000 mg | ORAL_TABLET | Freq: Once | ORAL | Status: DC

## 2016-05-18 MED ORDER — CARFILZOMIB CHEMO INJECTION 60 MG
20.0000 mg/m2 | Freq: Once | INTRAVENOUS | Status: AC
  Administered 2016-05-18: 36 mg via INTRAVENOUS
  Filled 2016-05-18: qty 18

## 2016-05-18 MED ORDER — SODIUM CHLORIDE 0.9% FLUSH
10.0000 mL | INTRAVENOUS | Status: DC | PRN
  Administered 2016-05-18: 10 mL
  Filled 2016-05-18: qty 10

## 2016-05-18 MED ORDER — ACETAMINOPHEN 325 MG PO TABS: 650.0000 mg | ORAL_TABLET | Freq: Once | ORAL | Status: DC

## 2016-05-18 MED ORDER — HEPARIN SOD (PORK) LOCK FLUSH 100 UNIT/ML IV SOLN
500.0000 [IU] | Freq: Once | INTRAVENOUS | Status: AC | PRN
  Administered 2016-05-18: 500 [IU]
  Filled 2016-05-18: qty 5

## 2016-05-18 NOTE — Patient Instructions (Signed)
Carfilzomib injection What is this medicine? CARFILZOMIB (kar FILZ oh mib) targets a specific protein within cancer cells and stops the cancer cells from growing. It is used to treat multiple myeloma. COMMON BRAND NAME(S): KYPROLIS What should I tell my health care provider before I take this medicine? They need to know if you have any of these conditions: -heart disease -history of blood clots -irregular heartbeat -kidney disease -liver disease -lung or breathing disease -an unusual or allergic reaction to carfilzomib, or other medicines, foods, dyes, or preservatives -pregnant or trying to get pregnant -breast-feeding How should I use this medicine? This medicine is for injection or infusion into a vein. It is given by a health care professional in a hospital or clinic setting. Talk to your pediatrician regarding the use of this medicine in children. Special care may be needed. What if I miss a dose? It is important not to miss your dose. Call your doctor or health care professional if you are unable to keep an appointment. What may interact with this medicine? Interactions are not expected. Give your health care provider a list of all the medicines, herbs, non-prescription drugs, or dietary supplements you use. Also tell them if you smoke, drink alcohol, or use illegal drugs. Some items may interact with your medicine. What should I watch for while using this medicine? Your condition will be monitored carefully while you are receiving this medicine. Report any side effects. Continue your course of treatment even though you feel ill unless your doctor tells you to stop. You may need blood work done while you are taking this medicine. Do not become pregnant while taking this medicine or for at least 30 days after stopping it. Women should inform their doctor if they wish to become pregnant or think they might be pregnant. There is a potential for serious side effects to an unborn child.  Men should not father a child while taking this medicine and for 90 days after stopping it. Talk to your health care professional or pharmacist for more information. Do not breast-feed an infant while taking this medicine. Check with your doctor or health care professional if you get an attack of severe diarrhea, nausea and vomiting, or if you sweat a lot. The loss of too much body fluid can make it dangerous for you to take this medicine. You may get dizzy. Do not drive, use machinery, or do anything that needs mental alertness until you know how this medicine affects you. Do not stand or sit up quickly, especially if you are an older patient. This reduces the risk of dizzy or fainting spells. What side effects may I notice from receiving this medicine? Side effects that you should report to your doctor or health care professional as soon as possible: -allergic reactions like skin rash, itching or hives, swelling of the face, lips, or tongue -confusion -dizziness -feeling faint or lightheaded -fever or chills -palpitations -seizures -signs and symptoms of bleeding such as bloody or black, tarry stools; red or dark-brown urine; spitting up blood or brown material that looks like coffee grounds; red spots on the skin; unusual bruising or bleeding including from the eye, gums, or nose -signs and symptoms of a blood clot such as breathing problems; changes in vision; chest pain; severe, sudden headache; pain, swelling, warmth in the leg; trouble speaking; sudden numbness or weakness of the face, arm or leg -signs and symptoms of kidney injury like trouble passing urine or change in the amount of urine -signs and   symptoms of liver injury like dark yellow or brown urine; general ill feeling or flu-like symptoms; light-colored stools; loss of appetite; nausea; right upper belly pain; unusually weak or tired; yellowing of the eyes or skin Side effects that usually do not require medical attention (report to  your doctor or health care professional if they continue or are bothersome): -back pain -cough -diarrhea -headache -muscle cramps -vomiting Where should I keep my medicine? This drug is given in a hospital or clinic and will not be stored at home.  2017 Elsevier/Gold Standard (2015-05-29 13:39:23)  

## 2016-05-19 LAB — PROTEIN ELECTROPHORESIS, SERUM, WITH REFLEX
A/G RATIO SPE: 0.9 (ref 0.7–1.7)
ALPHA 1: 0.2 g/dL (ref 0.0–0.4)
Albumin: 3.5 g/dL (ref 2.9–4.4)
Alpha 2: 0.5 g/dL (ref 0.4–1.0)
Beta: 0.9 g/dL (ref 0.7–1.3)
Gamma Globulin: 2.5 g/dL — ABNORMAL HIGH (ref 0.4–1.8)
Globulin, Total: 4.1 g/dL — ABNORMAL HIGH (ref 2.2–3.9)
INTERPRETATION(SEE BELOW): 0
M-SPIKE, %: 1.5 g/dL — AB
TOTAL PROTEIN: 7.6 g/dL (ref 6.0–8.5)

## 2016-05-21 ENCOUNTER — Other Ambulatory Visit: Payer: Self-pay | Admitting: *Deleted

## 2016-05-21 DIAGNOSIS — C9002 Multiple myeloma in relapse: Secondary | ICD-10-CM

## 2016-05-24 ENCOUNTER — Ambulatory Visit: Payer: Commercial Managed Care - HMO | Admitting: Internal Medicine

## 2016-05-24 ENCOUNTER — Ambulatory Visit (HOSPITAL_BASED_OUTPATIENT_CLINIC_OR_DEPARTMENT_OTHER): Payer: 59

## 2016-05-24 ENCOUNTER — Other Ambulatory Visit (HOSPITAL_BASED_OUTPATIENT_CLINIC_OR_DEPARTMENT_OTHER): Payer: 59

## 2016-05-24 VITALS — BP 120/67 | HR 83 | Temp 98.1°F | Resp 18

## 2016-05-24 DIAGNOSIS — C9002 Multiple myeloma in relapse: Secondary | ICD-10-CM | POA: Diagnosis not present

## 2016-05-24 DIAGNOSIS — Z5112 Encounter for antineoplastic immunotherapy: Secondary | ICD-10-CM | POA: Diagnosis not present

## 2016-05-24 LAB — CMP (CANCER CENTER ONLY)
ALK PHOS: 73 U/L (ref 26–84)
ALT: 31 U/L (ref 10–47)
AST: 31 U/L (ref 11–38)
Albumin: 3.4 g/dL (ref 3.3–5.5)
BUN: 25 mg/dL — AB (ref 7–22)
CHLORIDE: 102 meq/L (ref 98–108)
CO2: 29 mEq/L (ref 18–33)
CREATININE: 1.1 mg/dL (ref 0.6–1.2)
Calcium: 9.2 mg/dL (ref 8.0–10.3)
Glucose, Bld: 97 mg/dL (ref 73–118)
Potassium: 4 mEq/L (ref 3.3–4.7)
Sodium: 139 mEq/L (ref 128–145)
TOTAL PROTEIN: 7.9 g/dL (ref 6.4–8.1)
Total Bilirubin: 0.7 mg/dl (ref 0.20–1.60)

## 2016-05-24 LAB — CBC WITH DIFFERENTIAL (CANCER CENTER ONLY)
BASO#: 0 10*3/uL (ref 0.0–0.2)
BASO%: 0.3 % (ref 0.0–2.0)
EOS%: 2.6 % (ref 0.0–7.0)
Eosinophils Absolute: 0.1 10*3/uL (ref 0.0–0.5)
HCT: 27.7 % — ABNORMAL LOW (ref 34.8–46.6)
HGB: 9.2 g/dL — ABNORMAL LOW (ref 11.6–15.9)
LYMPH#: 1 10*3/uL (ref 0.9–3.3)
LYMPH%: 34.1 % (ref 14.0–48.0)
MCH: 30.6 pg (ref 26.0–34.0)
MCHC: 33.2 g/dL (ref 32.0–36.0)
MCV: 92 fL (ref 81–101)
MONO#: 0.6 10*3/uL (ref 0.1–0.9)
MONO%: 18.9 % — ABNORMAL HIGH (ref 0.0–13.0)
NEUT#: 1.3 10*3/uL — ABNORMAL LOW (ref 1.5–6.5)
NEUT%: 44.1 % (ref 39.6–80.0)
PLATELETS: 124 10*3/uL — AB (ref 145–400)
RBC: 3.01 10*6/uL — AB (ref 3.70–5.32)
RDW: 15.2 % (ref 11.1–15.7)
WBC: 3 10*3/uL — AB (ref 3.9–10.0)

## 2016-05-24 MED ORDER — HEPARIN SOD (PORK) LOCK FLUSH 100 UNIT/ML IV SOLN
500.0000 [IU] | Freq: Once | INTRAVENOUS | Status: AC | PRN
  Administered 2016-05-24: 500 [IU]
  Filled 2016-05-24: qty 5

## 2016-05-24 MED ORDER — SODIUM CHLORIDE 0.9 % IV SOLN
Freq: Once | INTRAVENOUS | Status: AC
  Administered 2016-05-24: 09:00:00 via INTRAVENOUS

## 2016-05-24 MED ORDER — SODIUM CHLORIDE 0.9% FLUSH
10.0000 mL | INTRAVENOUS | Status: DC | PRN
  Administered 2016-05-24: 10 mL
  Filled 2016-05-24: qty 10

## 2016-05-24 MED ORDER — DEXTROSE 5 % IV SOLN
20.0000 mg/m2 | Freq: Once | INTRAVENOUS | Status: AC
  Administered 2016-05-24: 36 mg via INTRAVENOUS
  Filled 2016-05-24: qty 18

## 2016-05-24 MED ORDER — SODIUM CHLORIDE 0.9 % IV SOLN: Freq: Once | INTRAVENOUS | Status: DC

## 2016-05-24 MED ORDER — SODIUM CHLORIDE 0.9% FLUSH
3.0000 mL | INTRAVENOUS | Status: DC | PRN
  Filled 2016-05-24: qty 10

## 2016-05-24 NOTE — Patient Instructions (Signed)
Carfilzomib injection What is this medicine? CARFILZOMIB (kar FILZ oh mib) targets a specific protein within cancer cells and stops the cancer cells from growing. It is used to treat multiple myeloma. COMMON BRAND NAME(S): KYPROLIS What should I tell my health care provider before I take this medicine? They need to know if you have any of these conditions: -heart disease -history of blood clots -irregular heartbeat -kidney disease -liver disease -lung or breathing disease -an unusual or allergic reaction to carfilzomib, or other medicines, foods, dyes, or preservatives -pregnant or trying to get pregnant -breast-feeding How should I use this medicine? This medicine is for injection or infusion into a vein. It is given by a health care professional in a hospital or clinic setting. Talk to your pediatrician regarding the use of this medicine in children. Special care may be needed. What if I miss a dose? It is important not to miss your dose. Call your doctor or health care professional if you are unable to keep an appointment. What may interact with this medicine? Interactions are not expected. Give your health care provider a list of all the medicines, herbs, non-prescription drugs, or dietary supplements you use. Also tell them if you smoke, drink alcohol, or use illegal drugs. Some items may interact with your medicine. What should I watch for while using this medicine? Your condition will be monitored carefully while you are receiving this medicine. Report any side effects. Continue your course of treatment even though you feel ill unless your doctor tells you to stop. You may need blood work done while you are taking this medicine. Do not become pregnant while taking this medicine or for at least 30 days after stopping it. Women should inform their doctor if they wish to become pregnant or think they might be pregnant. There is a potential for serious side effects to an unborn child.  Men should not father a child while taking this medicine and for 90 days after stopping it. Talk to your health care professional or pharmacist for more information. Do not breast-feed an infant while taking this medicine. Check with your doctor or health care professional if you get an attack of severe diarrhea, nausea and vomiting, or if you sweat a lot. The loss of too much body fluid can make it dangerous for you to take this medicine. You may get dizzy. Do not drive, use machinery, or do anything that needs mental alertness until you know how this medicine affects you. Do not stand or sit up quickly, especially if you are an older patient. This reduces the risk of dizzy or fainting spells. What side effects may I notice from receiving this medicine? Side effects that you should report to your doctor or health care professional as soon as possible: -allergic reactions like skin rash, itching or hives, swelling of the face, lips, or tongue -confusion -dizziness -feeling faint or lightheaded -fever or chills -palpitations -seizures -signs and symptoms of bleeding such as bloody or black, tarry stools; red or dark-brown urine; spitting up blood or brown material that looks like coffee grounds; red spots on the skin; unusual bruising or bleeding including from the eye, gums, or nose -signs and symptoms of a blood clot such as breathing problems; changes in vision; chest pain; severe, sudden headache; pain, swelling, warmth in the leg; trouble speaking; sudden numbness or weakness of the face, arm or leg -signs and symptoms of kidney injury like trouble passing urine or change in the amount of urine -signs and  symptoms of liver injury like dark yellow or brown urine; general ill feeling or flu-like symptoms; light-colored stools; loss of appetite; nausea; right upper belly pain; unusually weak or tired; yellowing of the eyes or skin Side effects that usually do not require medical attention (report to  your doctor or health care professional if they continue or are bothersome): -back pain -cough -diarrhea -headache -muscle cramps -vomiting Where should I keep my medicine? This drug is given in a hospital or clinic and will not be stored at home.  2017 Elsevier/Gold Standard (2015-05-29 13:39:23)

## 2016-05-24 NOTE — Progress Notes (Signed)
Ok to treat with ANC of 1.3 per VOV Dr Marin Olp. dph

## 2016-05-25 ENCOUNTER — Ambulatory Visit (HOSPITAL_BASED_OUTPATIENT_CLINIC_OR_DEPARTMENT_OTHER): Payer: 59

## 2016-05-25 VITALS — BP 107/55 | HR 85 | Temp 98.0°F | Resp 18

## 2016-05-25 DIAGNOSIS — C9002 Multiple myeloma in relapse: Secondary | ICD-10-CM | POA: Diagnosis not present

## 2016-05-25 DIAGNOSIS — Z5112 Encounter for antineoplastic immunotherapy: Secondary | ICD-10-CM | POA: Diagnosis not present

## 2016-05-25 MED ORDER — PROCHLORPERAZINE MALEATE 10 MG PO TABS: 10.0000 mg | ORAL_TABLET | Freq: Once | ORAL | Status: DC

## 2016-05-25 MED ORDER — ALTEPLASE 2 MG IJ SOLR
2.0000 mg | Freq: Once | INTRAMUSCULAR | Status: DC | PRN
  Filled 2016-05-25: qty 2

## 2016-05-25 MED ORDER — HEPARIN SOD (PORK) LOCK FLUSH 100 UNIT/ML IV SOLN
250.0000 [IU] | Freq: Once | INTRAVENOUS | Status: DC | PRN
  Filled 2016-05-25: qty 5

## 2016-05-25 MED ORDER — ACETAMINOPHEN 325 MG PO TABS: 650.0000 mg | ORAL_TABLET | Freq: Once | ORAL | Status: DC

## 2016-05-25 MED ORDER — SODIUM CHLORIDE 0.9% FLUSH
3.0000 mL | INTRAVENOUS | Status: DC | PRN
  Filled 2016-05-25: qty 10

## 2016-05-25 MED ORDER — SODIUM CHLORIDE 0.9 % IV SOLN
Freq: Once | INTRAVENOUS | Status: AC
  Administered 2016-05-25: 09:00:00 via INTRAVENOUS

## 2016-05-25 MED ORDER — DEXTROSE 5 % IV SOLN
20.0000 mg/m2 | Freq: Once | INTRAVENOUS | Status: AC
  Administered 2016-05-25: 36 mg via INTRAVENOUS
  Filled 2016-05-25: qty 18

## 2016-05-25 MED ORDER — HEPARIN SOD (PORK) LOCK FLUSH 100 UNIT/ML IV SOLN
500.0000 [IU] | Freq: Once | INTRAVENOUS | Status: AC | PRN
  Administered 2016-05-25: 500 [IU]
  Filled 2016-05-25: qty 5

## 2016-05-25 MED ORDER — SODIUM CHLORIDE 0.9 % IV SOLN: Freq: Once | INTRAVENOUS | Status: DC

## 2016-05-25 MED ORDER — SODIUM CHLORIDE 0.9% FLUSH
10.0000 mL | INTRAVENOUS | Status: DC | PRN
  Administered 2016-05-25: 10 mL
  Filled 2016-05-25: qty 10

## 2016-05-25 NOTE — Patient Instructions (Signed)
[facility] Discharge Instructions for Patients Receiving Chemotherapy  Today you received the following chemotherapy agents: Kyprolis.  To help prevent nausea and vomiting after your treatment, we encourage you to take your nausea medication as prescribed by MD. If you develop nausea and vomiting that is not controlled by your nausea medication, call the clinic.   BELOW ARE SYMPTOMS THAT SHOULD BE REPORTED IMMEDIATELY:  *FEVER GREATER THAN 100.5 F  *CHILLS WITH OR WITHOUT FEVER  NAUSEA AND VOMITING THAT IS NOT CONTROLLED WITH YOUR NAUSEA MEDICATION  *UNUSUAL SHORTNESS OF BREATH  *UNUSUAL BRUISING OR BLEEDING  TENDERNESS IN MOUTH AND THROAT WITH OR WITHOUT PRESENCE OF ULCERS  *URINARY PROBLEMS  *BOWEL PROBLEMS  UNUSUAL RASH Items with * indicate a potential emergency and should be followed up as soon as possible.  Feel free to call the clinic you have any questions or concerns. The clinic phone number is [phone].  Please show the CHEMO ALERT CARD at check-in to the Emergency Department and triage nurse.   

## 2016-05-28 ENCOUNTER — Other Ambulatory Visit: Payer: Self-pay | Admitting: *Deleted

## 2016-05-28 DIAGNOSIS — C9002 Multiple myeloma in relapse: Secondary | ICD-10-CM

## 2016-05-31 ENCOUNTER — Ambulatory Visit (HOSPITAL_BASED_OUTPATIENT_CLINIC_OR_DEPARTMENT_OTHER): Payer: 59

## 2016-05-31 ENCOUNTER — Other Ambulatory Visit (HOSPITAL_BASED_OUTPATIENT_CLINIC_OR_DEPARTMENT_OTHER): Payer: 59

## 2016-05-31 VITALS — BP 109/64 | HR 68 | Temp 97.6°F | Resp 18

## 2016-05-31 DIAGNOSIS — Z5112 Encounter for antineoplastic immunotherapy: Secondary | ICD-10-CM

## 2016-05-31 DIAGNOSIS — C9002 Multiple myeloma in relapse: Secondary | ICD-10-CM | POA: Diagnosis not present

## 2016-05-31 LAB — CBC WITH DIFFERENTIAL (CANCER CENTER ONLY)
BASO#: 0 10*3/uL (ref 0.0–0.2)
BASO%: 0.4 % (ref 0.0–2.0)
EOS%: 2.8 % (ref 0.0–7.0)
Eosinophils Absolute: 0.1 10*3/uL (ref 0.0–0.5)
HCT: 28.4 % — ABNORMAL LOW (ref 34.8–46.6)
HGB: 9.4 g/dL — ABNORMAL LOW (ref 11.6–15.9)
LYMPH#: 1 10*3/uL (ref 0.9–3.3)
LYMPH%: 33.8 % (ref 14.0–48.0)
MCH: 30.3 pg (ref 26.0–34.0)
MCHC: 33.1 g/dL (ref 32.0–36.0)
MCV: 92 fL (ref 81–101)
MONO#: 0.5 10*3/uL (ref 0.1–0.9)
MONO%: 18.9 % — AB (ref 0.0–13.0)
NEUT#: 1.2 10*3/uL — ABNORMAL LOW (ref 1.5–6.5)
NEUT%: 44.1 % (ref 39.6–80.0)
PLATELETS: 138 10*3/uL — AB (ref 145–400)
RBC: 3.1 10*6/uL — ABNORMAL LOW (ref 3.70–5.32)
RDW: 15 % (ref 11.1–15.7)
WBC: 2.8 10*3/uL — AB (ref 3.9–10.0)

## 2016-05-31 LAB — CMP (CANCER CENTER ONLY)
ALT(SGPT): 32 U/L (ref 10–47)
AST: 27 U/L (ref 11–38)
Albumin: 3.5 g/dL (ref 3.3–5.5)
Alkaline Phosphatase: 63 U/L (ref 26–84)
BUN, Bld: 24 mg/dL — ABNORMAL HIGH (ref 7–22)
CHLORIDE: 104 meq/L (ref 98–108)
CO2: 28 mEq/L (ref 18–33)
CREATININE: 0.9 mg/dL (ref 0.6–1.2)
Calcium: 8.9 mg/dL (ref 8.0–10.3)
GLUCOSE: 101 mg/dL (ref 73–118)
POTASSIUM: 3.8 meq/L (ref 3.3–4.7)
SODIUM: 139 meq/L (ref 128–145)
Total Bilirubin: 0.8 mg/dl (ref 0.20–1.60)
Total Protein: 8 g/dL (ref 6.4–8.1)

## 2016-05-31 MED ORDER — SODIUM CHLORIDE 0.9 % IV SOLN: Freq: Once | INTRAVENOUS | Status: DC

## 2016-05-31 MED ORDER — HEPARIN SOD (PORK) LOCK FLUSH 100 UNIT/ML IV SOLN
500.0000 [IU] | Freq: Once | INTRAVENOUS | Status: AC | PRN
  Administered 2016-05-31: 500 [IU]
  Filled 2016-05-31: qty 5

## 2016-05-31 MED ORDER — SODIUM CHLORIDE 0.9 % IV SOLN
Freq: Once | INTRAVENOUS | Status: AC
  Administered 2016-05-31: 09:00:00 via INTRAVENOUS

## 2016-05-31 MED ORDER — DEXTROSE 5 % IV SOLN
20.0000 mg/m2 | Freq: Once | INTRAVENOUS | Status: AC
  Administered 2016-05-31: 36 mg via INTRAVENOUS
  Filled 2016-05-31: qty 18

## 2016-05-31 MED ORDER — SODIUM CHLORIDE 0.9% FLUSH
10.0000 mL | INTRAVENOUS | Status: DC | PRN
  Administered 2016-05-31: 10 mL
  Filled 2016-05-31: qty 10

## 2016-05-31 NOTE — Progress Notes (Signed)
Dr. Marin Olp said ok to treat with [place] today

## 2016-05-31 NOTE — Progress Notes (Signed)
Patient already took Compazine and Tylenol at home this am, she has declined to take the medication here.

## 2016-05-31 NOTE — Patient Instructions (Signed)
[facility] Discharge Instructions for Patients Receiving Chemotherapy  Today you received the following chemotherapy agents: Kyprolis.  To help prevent nausea and vomiting after your treatment, we encourage you to take your nausea medication as prescribed by MD. If you develop nausea and vomiting that is not controlled by your nausea medication, call the clinic.   BELOW ARE SYMPTOMS THAT SHOULD BE REPORTED IMMEDIATELY:  *FEVER GREATER THAN 100.5 F  *CHILLS WITH OR WITHOUT FEVER  NAUSEA AND VOMITING THAT IS NOT CONTROLLED WITH YOUR NAUSEA MEDICATION  *UNUSUAL SHORTNESS OF BREATH  *UNUSUAL BRUISING OR BLEEDING  TENDERNESS IN MOUTH AND THROAT WITH OR WITHOUT PRESENCE OF ULCERS  *URINARY PROBLEMS  *BOWEL PROBLEMS  UNUSUAL RASH Items with * indicate a potential emergency and should be followed up as soon as possible.  Feel free to call the clinic you have any questions or concerns. The clinic phone number is [phone].  Please show the CHEMO ALERT CARD at check-in to the Emergency Department and triage nurse.   

## 2016-06-01 ENCOUNTER — Ambulatory Visit (HOSPITAL_BASED_OUTPATIENT_CLINIC_OR_DEPARTMENT_OTHER): Payer: 59

## 2016-06-01 VITALS — BP 109/47 | HR 80 | Temp 98.1°F | Resp 17

## 2016-06-01 DIAGNOSIS — Z5112 Encounter for antineoplastic immunotherapy: Secondary | ICD-10-CM | POA: Diagnosis not present

## 2016-06-01 DIAGNOSIS — C9002 Multiple myeloma in relapse: Secondary | ICD-10-CM | POA: Diagnosis not present

## 2016-06-01 MED ORDER — HEPARIN SOD (PORK) LOCK FLUSH 100 UNIT/ML IV SOLN
500.0000 [IU] | Freq: Once | INTRAVENOUS | Status: AC | PRN
  Administered 2016-06-01: 500 [IU]
  Filled 2016-06-01: qty 5

## 2016-06-01 MED ORDER — SODIUM CHLORIDE 0.9 % IV SOLN
Freq: Once | INTRAVENOUS | Status: AC
  Administered 2016-06-01: 08:00:00 via INTRAVENOUS

## 2016-06-01 MED ORDER — ACETAMINOPHEN 325 MG PO TABS: 650.0000 mg | ORAL_TABLET | Freq: Once | ORAL | Status: DC

## 2016-06-01 MED ORDER — PROCHLORPERAZINE MALEATE 10 MG PO TABS: 10.0000 mg | ORAL_TABLET | Freq: Once | ORAL | Status: DC

## 2016-06-01 MED ORDER — SODIUM CHLORIDE 0.9% FLUSH
10.0000 mL | INTRAVENOUS | Status: DC | PRN
  Administered 2016-06-01: 10 mL
  Filled 2016-06-01: qty 10

## 2016-06-01 MED ORDER — DEXTROSE 5 % IV SOLN
20.0000 mg/m2 | Freq: Once | INTRAVENOUS | Status: AC
  Administered 2016-06-01: 36 mg via INTRAVENOUS
  Filled 2016-06-01: qty 18

## 2016-06-01 NOTE — Patient Instructions (Signed)
[facility] Discharge Instructions for Patients Receiving Chemotherapy  Today you received the following chemotherapy agents: Kyprolis.  To help prevent nausea and vomiting after your treatment, we encourage you to take your nausea medication as prescribed by MD. If you develop nausea and vomiting that is not controlled by your nausea medication, call the clinic.   BELOW ARE SYMPTOMS THAT SHOULD BE REPORTED IMMEDIATELY:  *FEVER GREATER THAN 100.5 F  *CHILLS WITH OR WITHOUT FEVER  NAUSEA AND VOMITING THAT IS NOT CONTROLLED WITH YOUR NAUSEA MEDICATION  *UNUSUAL SHORTNESS OF BREATH  *UNUSUAL BRUISING OR BLEEDING  TENDERNESS IN MOUTH AND THROAT WITH OR WITHOUT PRESENCE OF ULCERS  *URINARY PROBLEMS  *BOWEL PROBLEMS  UNUSUAL RASH Items with * indicate a potential emergency and should be followed up as soon as possible.  Feel free to call the clinic you have any questions or concerns. The clinic phone number is [phone].  Please show the CHEMO ALERT CARD at check-in to the Emergency Department and triage nurse.   

## 2016-06-14 ENCOUNTER — Ambulatory Visit (HOSPITAL_BASED_OUTPATIENT_CLINIC_OR_DEPARTMENT_OTHER): Payer: 59

## 2016-06-14 ENCOUNTER — Other Ambulatory Visit (HOSPITAL_BASED_OUTPATIENT_CLINIC_OR_DEPARTMENT_OTHER): Payer: 59

## 2016-06-14 ENCOUNTER — Ambulatory Visit (HOSPITAL_BASED_OUTPATIENT_CLINIC_OR_DEPARTMENT_OTHER): Payer: 59 | Admitting: Hematology & Oncology

## 2016-06-14 VITALS — BP 132/59 | HR 83 | Temp 97.5°F | Wt 163.1 lb

## 2016-06-14 DIAGNOSIS — C9002 Multiple myeloma in relapse: Secondary | ICD-10-CM

## 2016-06-14 DIAGNOSIS — Z5112 Encounter for antineoplastic immunotherapy: Secondary | ICD-10-CM

## 2016-06-14 DIAGNOSIS — C9000 Multiple myeloma not having achieved remission: Secondary | ICD-10-CM

## 2016-06-14 LAB — CMP (CANCER CENTER ONLY)
ALT(SGPT): 23 U/L (ref 10–47)
AST: 28 U/L (ref 11–38)
Albumin: 3.6 g/dL (ref 3.3–5.5)
Alkaline Phosphatase: 59 U/L (ref 26–84)
BUN: 22 mg/dL (ref 7–22)
CALCIUM: 9.4 mg/dL (ref 8.0–10.3)
CHLORIDE: 107 meq/L (ref 98–108)
CO2: 28 mEq/L (ref 18–33)
Creat: 1 mg/dl (ref 0.6–1.2)
Glucose, Bld: 88 mg/dL (ref 73–118)
POTASSIUM: 4.1 meq/L (ref 3.3–4.7)
Sodium: 142 mEq/L (ref 128–145)
Total Bilirubin: 0.8 mg/dl (ref 0.20–1.60)
Total Protein: 7.7 g/dL (ref 6.4–8.1)

## 2016-06-14 LAB — CBC WITH DIFFERENTIAL (CANCER CENTER ONLY)
BASO#: 0 10*3/uL (ref 0.0–0.2)
BASO%: 0.3 % (ref 0.0–2.0)
EOS ABS: 0.1 10*3/uL (ref 0.0–0.5)
EOS%: 2.4 % (ref 0.0–7.0)
HEMATOCRIT: 28.8 % — AB (ref 34.8–46.6)
HGB: 9.7 g/dL — ABNORMAL LOW (ref 11.6–15.9)
LYMPH#: 0.9 10*3/uL (ref 0.9–3.3)
LYMPH%: 31.7 % (ref 14.0–48.0)
MCH: 31.3 pg (ref 26.0–34.0)
MCHC: 33.7 g/dL (ref 32.0–36.0)
MCV: 93 fL (ref 81–101)
MONO#: 0.5 10*3/uL (ref 0.1–0.9)
MONO%: 15.7 % — ABNORMAL HIGH (ref 0.0–13.0)
NEUT#: 1.4 10*3/uL — ABNORMAL LOW (ref 1.5–6.5)
NEUT%: 49.9 % (ref 39.6–80.0)
Platelets: 170 10*3/uL (ref 145–400)
RBC: 3.1 10*6/uL — AB (ref 3.70–5.32)
RDW: 15.6 % (ref 11.1–15.7)
WBC: 2.9 10*3/uL — ABNORMAL LOW (ref 3.9–10.0)

## 2016-06-14 MED ORDER — SODIUM CHLORIDE 0.9% FLUSH
10.0000 mL | INTRAVENOUS | Status: DC | PRN
  Administered 2016-06-14: 10 mL
  Filled 2016-06-14: qty 10

## 2016-06-14 MED ORDER — SODIUM CHLORIDE 0.9 % IV SOLN
Freq: Once | INTRAVENOUS | Status: AC
  Administered 2016-06-14: 09:00:00 via INTRAVENOUS

## 2016-06-14 MED ORDER — HEPARIN SOD (PORK) LOCK FLUSH 100 UNIT/ML IV SOLN
500.0000 [IU] | Freq: Once | INTRAVENOUS | Status: AC | PRN
  Administered 2016-06-14: 500 [IU]
  Filled 2016-06-14: qty 5

## 2016-06-14 MED ORDER — DEXTROSE 5 % IV SOLN
20.0000 mg/m2 | Freq: Once | INTRAVENOUS | Status: AC
  Administered 2016-06-14: 36 mg via INTRAVENOUS
  Filled 2016-06-14: qty 18

## 2016-06-14 NOTE — Patient Instructions (Signed)
[facility] Discharge Instructions for Patients Receiving Chemotherapy  Today you received the following chemotherapy agents: Kyprolis   To help prevent nausea and vomiting after your treatment, we encourage you to take your nausea medication as directed.    If you develop nausea and vomiting that is not controlled by your nausea medication, call the clinic.   BELOW ARE SYMPTOMS THAT SHOULD BE REPORTED IMMEDIATELY:  *FEVER GREATER THAN 100.5 F  *CHILLS WITH OR WITHOUT FEVER  NAUSEA AND VOMITING THAT IS NOT CONTROLLED WITH YOUR NAUSEA MEDICATION  *UNUSUAL SHORTNESS OF BREATH  *UNUSUAL BRUISING OR BLEEDING  TENDERNESS IN MOUTH AND THROAT WITH OR WITHOUT PRESENCE OF ULCERS  *URINARY PROBLEMS  *BOWEL PROBLEMS  UNUSUAL RASH Items with * indicate a potential emergency and should be followed up as soon as possible.  Feel free to call the clinic you have any questions or concerns. The clinic phone number is [phone].  Please show the CHEMO ALERT CARD at check-in to the Emergency Department and triage nurse.   

## 2016-06-14 NOTE — Progress Notes (Signed)
OK to treat with ANC 1.4 today per MD Ennever   Patient already took Acetaminophen and Compazine jpta today.

## 2016-06-14 NOTE — Progress Notes (Signed)
Hematology and Oncology Follow Up Visit  Barbara Cook [id number] 1956/02/05 61 y.o. 06/14/2016   Principle Diagnosis:  IgG kappa myeloma   Current Therapy:     Velcade/Revlimid/ -Velcade every 3 week dosing  Ninlaro/Revlimid - start in 11/03/2015 Zometa 4 mg IV every 3 month - next dose in August Kyprolis - start 04/19/2016 - s/p cycle #2     Interim History:  Ms.  Cook is back for a follow-up. She really is doing quite well. She has tolerated Kyprolis in [place] well. She's not had any diarrhea.  We are holding Zometa for right now. She is not complaining of any bony pain. I don't want to "rock the boat" for right now.  With 2 cycles of treatment, she really has responded. Her total protein has been coming down nicely. We will see what her actual myeloma levels are right now.   She's had no possible cough or shortness of breath. There's been no issues with leg swelling. She's had no rashes.   She does have a very low erythropoietin level. As such, which always give her Aranesp for the anemia. She is not symptomatic with the anemia.   Her overall performance status is ECOG 1  Medications:  Current Outpatient Prescriptions:  .  acetaminophen (TYLENOL) 500 MG tablet, Take 500 mg by mouth every 6 (six) hours as needed for mild pain or headache., Disp: , Rfl:  .  acyclovir (ZOVIRAX) 400 MG tablet, Take 1 tablet (400 mg total) by mouth daily., Disp: 30 tablet, Rfl: 3 .  doxycycline (VIBRA-TABS) 100 MG tablet, Take 1 tablet (100 mg total) by mouth 2 (two) times daily., Disp: 10 tablet, Rfl: 0 .  fluconazole (DIFLUCAN) 200 MG tablet, Take 1 daily if needed for yeast infection, Disp: 3 tablet, Rfl: 0 .  hyoscyamine (LEVSIN SL) 0.125 MG SL tablet, Place 1 tablet (0.125 mg total) under the tongue every 4 (four) hours as needed., Disp: 60 tablet, Rfl: 0 .  KLOR-CON 8 MEQ tablet, 1 TABLET ONCE A DAY ORALLY 30 DAY(S), Disp: , Rfl: 6 .  lidocaine-prilocaine (EMLA) cream, Apply to  affected area once, Disp: 30 g, Rfl: 3 .  LORazepam (ATIVAN) 0.5 MG tablet, Take 1 tablet (0.5 mg total) by mouth every 6 (six) hours as needed (Nausea or vomiting)., Disp: 30 tablet, Rfl: 0 .  magnesium oxide (MAG-OX) 400 (241.3 MG) MG tablet, Take 1 tablet (400 mg total) by mouth 2 (two) times daily., Disp: 60 tablet, Rfl: 3 .  Multiple Vitamin (MULTIVITAMIN) tablet, Take 1 tablet by mouth every evening. , Disp: , Rfl:  .  ondansetron (ZOFRAN) 8 MG tablet, Take 1 tablet (8 mg total) by mouth 2 (two) times daily. For nausea & vomiting. Take 1 tablet 1 hour prior to chemotherapy., Disp: 20 tablet, Rfl: 0 .  ondansetron (ZOFRAN) 8 MG tablet, Take 1 tablet (8 mg total) by mouth 2 (two) times daily as needed (Nausea or vomiting)., Disp: 30 tablet, Rfl: 1 .  pantoprazole (PROTONIX) 40 MG tablet, Take 1 tablet (40 mg total) by mouth daily., Disp: 90 tablet, Rfl: 3 .  polyethylene glycol powder (GLYCOLAX/MIRALAX) powder, 1 capful daily as needed, Disp: 255 g, Rfl: 3 .  Probiotic Product (ALIGN) 4 MG CAPS, Take 1 capsule by mouth daily., Disp: , Rfl:  .  prochlorperazine (COMPAZINE) 10 MG tablet, Take 1 tablet (10 mg total) by mouth every 6 (six) hours as needed (Nausea or vomiting)., Disp: 30 tablet, Rfl: 1 .  Pyridoxine HCl (  VITAMIN B-6) 250 MG tablet, Take 1 tablet (250 mg total) by mouth daily., Disp: 30 tablet, Rfl: 6 .  spironolactone (ALDACTONE) 50 MG tablet, Take 50 mg by mouth daily., Disp: , Rfl: 4 .  torsemide (DEMADEX) 20 MG tablet, Take 1 tablet (20 mg total) by mouth daily as needed., Disp: 30 tablet, Rfl: 6 .  Vitamin D, Ergocalciferol, (DRISDOL) 50000 units CAPS capsule, TAKE 1 CAPSULE (50,000 UNITS TOTAL) BY MOUTH ONCE A WEEK., Disp: 12 capsule, Rfl: 3  Current Facility-Administered Medications:  .  0.9 %  sodium chloride infusion, 500 mL, Intravenous, Continuous, Jerene Bears, MD  Allergies:  Allergies  Allergen Reactions  . Codeine Palpitations    Past Medical History, Surgical  history, Social history, and Family History were reviewed and updated.  Review of Systems: As above  Physical Exam:  weight is 163 lb 1.6 oz (74 kg). Her oral temperature is 97.5 F (36.4 C). Her blood pressure is 132/59 (abnormal) and her pulse is 83.   Well-developed and well-nourished African American female. Head and neck exam shows no ocular or oral lesions. There are no palpable cervical or supraclavicular lymph nodes.There is some tenderness to palpation just to the left of the midline at the base of her neck. I cannot palpate any lymph nodes. I cannot palpate her thyroid. She has a good carotid pulse. Lungs are clear. Cardiac exam regular rate and rhythm with no murmurs, rubs or bruits. Abdomen is soft. She is mildly obese. She really has no abdominal distention. She has no palpable liver or spleen tip. Back exam shows no tenderness over the spine, ribs or hips. Extremity shows no clubbing, cyanosis or edema. Skin exam shows no rashes, ecchymoses or petechia. Neurological exam is nonfocal.  Lab Results  Component Value Date   WBC 2.9 (L) 06/14/2016   HGB 9.7 (L) 06/14/2016   HCT 28.8 (L) 06/14/2016   MCV 93 06/14/2016   PLT 170 06/14/2016     Chemistry      Component Value Date/Time   NA 139 05/31/2016 0816   NA 139 02/20/2016 1207   K 3.8 05/31/2016 0816   K 3.8 02/20/2016 1207   CL 104 05/31/2016 0816   CO2 28 05/31/2016 0816   CO2 29 02/20/2016 1207   BUN 24 (H) 05/31/2016 0816   BUN 22.3 02/20/2016 1207   CREATININE 0.9 05/31/2016 0816   CREATININE 1.3 (H) 02/20/2016 1207      Component Value Date/Time   CALCIUM 8.9 05/31/2016 0816   CALCIUM 9.7 02/20/2016 1207   ALKPHOS 63 05/31/2016 0816   ALKPHOS 62 02/20/2016 1207   AST 27 05/31/2016 0816   AST 27 02/20/2016 1207   ALT 32 05/31/2016 0816   ALT 19 02/20/2016 1207   BILITOT 0.80 05/31/2016 0816   BILITOT 0.44 02/20/2016 1207         Impression and Plan: Barbara Cook is 61 year old African-American  female with IgG kappa myeloma.   For now, I will not increase the dose of Kyprolis. I'll like to just keep her on the initial dose. I don't want to cause too much disruption. She really is doing nicely. I am encouraged by this.  We will plan to see her back in one month.  If her anemia somehow worsens, then I will see about adding Aranesp.   [name], MD 2/5/20188:44 AM

## 2016-06-15 ENCOUNTER — Ambulatory Visit (HOSPITAL_BASED_OUTPATIENT_CLINIC_OR_DEPARTMENT_OTHER): Payer: 59

## 2016-06-15 ENCOUNTER — Other Ambulatory Visit: Payer: Self-pay | Admitting: Family

## 2016-06-15 VITALS — BP 126/55 | HR 83 | Temp 98.1°F | Resp 17

## 2016-06-15 DIAGNOSIS — C9002 Multiple myeloma in relapse: Secondary | ICD-10-CM

## 2016-06-15 DIAGNOSIS — Z5112 Encounter for antineoplastic immunotherapy: Secondary | ICD-10-CM | POA: Diagnosis not present

## 2016-06-15 LAB — KAPPA/LAMBDA LIGHT CHAINS
IG KAPPA FREE LIGHT CHAIN: 24.5 mg/L — AB (ref 3.3–19.4)
Ig Lambda Free Light Chain: 9 mg/L (ref 5.7–26.3)
KAPPA/LAMBDA FLC RATIO: 2.72 — AB (ref 0.26–1.65)

## 2016-06-15 LAB — IGG, IGA, IGM
IGA/IMMUNOGLOBULIN A, SERUM: 85 mg/dL — AB (ref 87–352)
IGM (IMMUNOGLOBIN M), SRM: 23 mg/dL — AB (ref 26–217)
IgG, Qn, Serum: 2117 mg/dL — ABNORMAL HIGH (ref 700–1600)

## 2016-06-15 MED ORDER — DEXTROSE 5 % IV SOLN
20.0000 mg/m2 | Freq: Once | INTRAVENOUS | Status: AC
  Administered 2016-06-15: 36 mg via INTRAVENOUS
  Filled 2016-06-15: qty 18

## 2016-06-15 MED ORDER — SODIUM CHLORIDE 0.9% FLUSH
10.0000 mL | INTRAVENOUS | Status: DC | PRN
Start: 2016-06-15 — End: 2016-06-15
  Administered 2016-06-15: 10 mL
  Filled 2016-06-15: qty 10

## 2016-06-15 MED ORDER — PROCHLORPERAZINE MALEATE 10 MG PO TABS: 10.0000 mg | ORAL_TABLET | Freq: Once | ORAL | Status: DC

## 2016-06-15 MED ORDER — SODIUM CHLORIDE 0.9 % IV SOLN: Freq: Once | INTRAVENOUS | Status: DC

## 2016-06-15 MED ORDER — ACETAMINOPHEN 325 MG PO TABS: 650.0000 mg | ORAL_TABLET | Freq: Once | ORAL | Status: DC

## 2016-06-15 MED ORDER — SODIUM CHLORIDE 0.9 % IV SOLN
Freq: Once | INTRAVENOUS | Status: AC
  Administered 2016-06-15: 09:00:00 via INTRAVENOUS

## 2016-06-15 MED ORDER — HEPARIN SOD (PORK) LOCK FLUSH 100 UNIT/ML IV SOLN
500.0000 [IU] | Freq: Once | INTRAVENOUS | Status: AC | PRN
  Administered 2016-06-15: 500 [IU]
  Filled 2016-06-15: qty 5

## 2016-06-15 NOTE — Patient Instructions (Signed)
[facility] Discharge Instructions for Patients Receiving Chemotherapy  Today you received the following chemotherapy agents: Kyprolis.  To help prevent nausea and vomiting after your treatment, we encourage you to take your nausea medication as prescribed by MD. If you develop nausea and vomiting that is not controlled by your nausea medication, call the clinic.   BELOW ARE SYMPTOMS THAT SHOULD BE REPORTED IMMEDIATELY:  *FEVER GREATER THAN 100.5 F  *CHILLS WITH OR WITHOUT FEVER  NAUSEA AND VOMITING THAT IS NOT CONTROLLED WITH YOUR NAUSEA MEDICATION  *UNUSUAL SHORTNESS OF BREATH  *UNUSUAL BRUISING OR BLEEDING  TENDERNESS IN MOUTH AND THROAT WITH OR WITHOUT PRESENCE OF ULCERS  *URINARY PROBLEMS  *BOWEL PROBLEMS  UNUSUAL RASH Items with * indicate a potential emergency and should be followed up as soon as possible.  Feel free to call the clinic you have any questions or concerns. The clinic phone number is [phone].  Please show the CHEMO ALERT CARD at check-in to the Emergency Department and triage nurse.   

## 2016-06-16 ENCOUNTER — Other Ambulatory Visit: Payer: Self-pay | Admitting: Family

## 2016-06-17 LAB — PROTEIN ELECTROPHORESIS, SERUM, WITH REFLEX
A/G Ratio: 1 (ref 0.7–1.7)
ALPHA 1: 0.2 g/dL (ref 0.0–0.4)
ALPHA 2: 0.5 g/dL (ref 0.4–1.0)
Albumin: 3.7 g/dL (ref 2.9–4.4)
Beta: 0.9 g/dL (ref 0.7–1.3)
GAMMA GLOBULIN: 1.9 g/dL — AB (ref 0.4–1.8)
GLOBULIN, TOTAL: 3.6 g/dL (ref 2.2–3.9)
Interpretation(See Below): 0
M-SPIKE, %: 1.2 g/dL — AB
Total Protein: 7.3 g/dL (ref 6.0–8.5)

## 2016-06-21 ENCOUNTER — Ambulatory Visit: Payer: 59

## 2016-06-21 ENCOUNTER — Other Ambulatory Visit: Payer: Self-pay | Admitting: Hematology & Oncology

## 2016-06-21 ENCOUNTER — Other Ambulatory Visit (HOSPITAL_BASED_OUTPATIENT_CLINIC_OR_DEPARTMENT_OTHER): Payer: 59

## 2016-06-21 ENCOUNTER — Other Ambulatory Visit: Payer: Self-pay | Admitting: *Deleted

## 2016-06-21 ENCOUNTER — Ambulatory Visit (HOSPITAL_BASED_OUTPATIENT_CLINIC_OR_DEPARTMENT_OTHER): Payer: 59

## 2016-06-21 DIAGNOSIS — C9000 Multiple myeloma not having achieved remission: Secondary | ICD-10-CM

## 2016-06-21 DIAGNOSIS — C9002 Multiple myeloma in relapse: Secondary | ICD-10-CM

## 2016-06-21 DIAGNOSIS — Z5112 Encounter for antineoplastic immunotherapy: Secondary | ICD-10-CM | POA: Diagnosis not present

## 2016-06-21 LAB — CBC WITH DIFFERENTIAL (CANCER CENTER ONLY)
BASO#: 0 10*3/uL (ref 0.0–0.2)
BASO%: 0.4 % (ref 0.0–2.0)
EOS ABS: 0.1 10*3/uL (ref 0.0–0.5)
EOS%: 2.5 % (ref 0.0–7.0)
HEMATOCRIT: 27 % — AB (ref 34.8–46.6)
HEMOGLOBIN: 9 g/dL — AB (ref 11.6–15.9)
LYMPH#: 0.9 10*3/uL (ref 0.9–3.3)
LYMPH%: 39.7 % (ref 14.0–48.0)
MCH: 30.9 pg (ref 26.0–34.0)
MCHC: 33.3 g/dL (ref 32.0–36.0)
MCV: 93 fL (ref 81–101)
MONO#: 0.4 10*3/uL (ref 0.1–0.9)
MONO%: 18.1 % — AB (ref 0.0–13.0)
NEUT%: 39.3 % — ABNORMAL LOW (ref 39.6–80.0)
NEUTROS ABS: 0.9 10*3/uL — AB (ref 1.5–6.5)
Platelets: 119 10*3/uL — ABNORMAL LOW (ref 145–400)
RBC: 2.91 10*6/uL — AB (ref 3.70–5.32)
RDW: 15.3 % (ref 11.1–15.7)
WBC: 2.4 10*3/uL — AB (ref 3.9–10.0)

## 2016-06-21 LAB — CMP (CANCER CENTER ONLY)
ALBUMIN: 3.4 g/dL (ref 3.3–5.5)
ALT(SGPT): 27 U/L (ref 10–47)
AST: 28 U/L (ref 11–38)
Alkaline Phosphatase: 53 U/L (ref 26–84)
BILIRUBIN TOTAL: 0.6 mg/dL (ref 0.20–1.60)
BUN, Bld: 21 mg/dL (ref 7–22)
CALCIUM: 8.6 mg/dL (ref 8.0–10.3)
CHLORIDE: 104 meq/L (ref 98–108)
CO2: 27 mEq/L (ref 18–33)
CREATININE: 0.8 mg/dL (ref 0.6–1.2)
Glucose, Bld: 79 mg/dL (ref 73–118)
Potassium: 4.2 mEq/L (ref 3.3–4.7)
SODIUM: 143 meq/L (ref 128–145)
TOTAL PROTEIN: 7.1 g/dL (ref 6.4–8.1)

## 2016-06-21 MED ORDER — HEPARIN SOD (PORK) LOCK FLUSH 100 UNIT/ML IV SOLN
500.0000 [IU] | Freq: Once | INTRAVENOUS | Status: AC | PRN
  Administered 2016-06-21: 500 [IU]
  Filled 2016-06-21: qty 5

## 2016-06-21 MED ORDER — SODIUM CHLORIDE 0.9% FLUSH
10.0000 mL | INTRAVENOUS | Status: DC | PRN
Start: 2016-06-21 — End: 2016-06-21
  Administered 2016-06-21: 10 mL
  Filled 2016-06-21: qty 10

## 2016-06-21 MED ORDER — DEXTROSE 5 % IV SOLN
20.0000 mg/m2 | Freq: Once | INTRAVENOUS | Status: AC
  Administered 2016-06-21: 36 mg via INTRAVENOUS
  Filled 2016-06-21: qty 18

## 2016-06-21 MED ORDER — SODIUM CHLORIDE 0.9 % IV SOLN: Freq: Once | INTRAVENOUS | Status: DC

## 2016-06-21 MED ORDER — SODIUM CHLORIDE 0.9 % IV SOLN
Freq: Once | INTRAVENOUS | Status: AC
  Administered 2016-06-21: 11:00:00 via INTRAVENOUS

## 2016-06-21 NOTE — Patient Instructions (Signed)
[facility] Discharge Instructions for Patients Receiving Chemotherapy  Today you received the following chemotherapy agents: Kyprolis.  To help prevent nausea and vomiting after your treatment, we encourage you to take your nausea medication as prescribed by MD. If you develop nausea and vomiting that is not controlled by your nausea medication, call the clinic.   BELOW ARE SYMPTOMS THAT SHOULD BE REPORTED IMMEDIATELY:  *FEVER GREATER THAN 100.5 F  *CHILLS WITH OR WITHOUT FEVER  NAUSEA AND VOMITING THAT IS NOT CONTROLLED WITH YOUR NAUSEA MEDICATION  *UNUSUAL SHORTNESS OF BREATH  *UNUSUAL BRUISING OR BLEEDING  TENDERNESS IN MOUTH AND THROAT WITH OR WITHOUT PRESENCE OF ULCERS  *URINARY PROBLEMS  *BOWEL PROBLEMS  UNUSUAL RASH Items with * indicate a potential emergency and should be followed up as soon as possible.  Feel free to call the clinic you have any questions or concerns. The clinic phone number is [phone].  Please show the CHEMO ALERT CARD at check-in to the Emergency Department and triage nurse.   

## 2016-06-21 NOTE — Progress Notes (Signed)
Ok to treat today with ANC level per MD Ennever- pt educated on neutropenia. Pt declined any po premeds - she took them at home this am.

## 2016-06-22 ENCOUNTER — Ambulatory Visit (HOSPITAL_BASED_OUTPATIENT_CLINIC_OR_DEPARTMENT_OTHER): Payer: 59

## 2016-06-22 VITALS — BP 104/59 | HR 70 | Temp 97.5°F | Resp 18

## 2016-06-22 DIAGNOSIS — Z5112 Encounter for antineoplastic immunotherapy: Secondary | ICD-10-CM | POA: Diagnosis not present

## 2016-06-22 DIAGNOSIS — C9002 Multiple myeloma in relapse: Secondary | ICD-10-CM | POA: Diagnosis not present

## 2016-06-22 MED ORDER — DEXTROSE 5 % IV SOLN
20.0000 mg/m2 | Freq: Once | INTRAVENOUS | Status: AC
  Administered 2016-06-22: 36 mg via INTRAVENOUS
  Filled 2016-06-22: qty 18

## 2016-06-22 MED ORDER — ACETAMINOPHEN 325 MG PO TABS: 650.0000 mg | ORAL_TABLET | Freq: Once | ORAL | Status: DC

## 2016-06-22 MED ORDER — HEPARIN SOD (PORK) LOCK FLUSH 100 UNIT/ML IV SOLN
500.0000 [IU] | Freq: Once | INTRAVENOUS | Status: AC | PRN
  Administered 2016-06-22: 500 [IU]
  Filled 2016-06-22: qty 5

## 2016-06-22 MED ORDER — PROCHLORPERAZINE MALEATE 10 MG PO TABS: 10.0000 mg | ORAL_TABLET | Freq: Once | ORAL | Status: DC

## 2016-06-22 MED ORDER — SODIUM CHLORIDE 0.9 % IV SOLN: Freq: Once | INTRAVENOUS | Status: DC

## 2016-06-22 MED ORDER — SODIUM CHLORIDE 0.9 % IV SOLN
Freq: Once | INTRAVENOUS | Status: AC
  Administered 2016-06-22: 09:00:00 via INTRAVENOUS

## 2016-06-22 MED ORDER — SODIUM CHLORIDE 0.9% FLUSH
10.0000 mL | INTRAVENOUS | Status: DC | PRN
  Administered 2016-06-22: 10 mL
  Filled 2016-06-22: qty 10

## 2016-06-22 NOTE — Patient Instructions (Signed)
[facility] Discharge Instructions for Patients Receiving Chemotherapy  Today you received the following chemotherapy agents: Kyprolis.  To help prevent nausea and vomiting after your treatment, we encourage you to take your nausea medication as prescribed by MD. If you develop nausea and vomiting that is not controlled by your nausea medication, call the clinic.   BELOW ARE SYMPTOMS THAT SHOULD BE REPORTED IMMEDIATELY:  *FEVER GREATER THAN 100.5 F  *CHILLS WITH OR WITHOUT FEVER  NAUSEA AND VOMITING THAT IS NOT CONTROLLED WITH YOUR NAUSEA MEDICATION  *UNUSUAL SHORTNESS OF BREATH  *UNUSUAL BRUISING OR BLEEDING  TENDERNESS IN MOUTH AND THROAT WITH OR WITHOUT PRESENCE OF ULCERS  *URINARY PROBLEMS  *BOWEL PROBLEMS  UNUSUAL RASH Items with * indicate a potential emergency and should be followed up as soon as possible.  Feel free to call the clinic you have any questions or concerns. The clinic phone number is [phone].  Please show the CHEMO ALERT CARD at check-in to the Emergency Department and triage nurse.   

## 2016-06-25 ENCOUNTER — Other Ambulatory Visit: Payer: Self-pay | Admitting: *Deleted

## 2016-06-25 DIAGNOSIS — C9000 Multiple myeloma not having achieved remission: Secondary | ICD-10-CM

## 2016-06-28 ENCOUNTER — Other Ambulatory Visit (HOSPITAL_BASED_OUTPATIENT_CLINIC_OR_DEPARTMENT_OTHER): Payer: 59

## 2016-06-28 ENCOUNTER — Ambulatory Visit (HOSPITAL_BASED_OUTPATIENT_CLINIC_OR_DEPARTMENT_OTHER): Payer: 59

## 2016-06-28 ENCOUNTER — Other Ambulatory Visit: Payer: Self-pay | Admitting: Family

## 2016-06-28 ENCOUNTER — Ambulatory Visit: Payer: 59

## 2016-06-28 VITALS — BP 113/46 | HR 72 | Temp 98.2°F | Resp 18

## 2016-06-28 DIAGNOSIS — C9000 Multiple myeloma not having achieved remission: Secondary | ICD-10-CM

## 2016-06-28 DIAGNOSIS — Z5112 Encounter for antineoplastic immunotherapy: Secondary | ICD-10-CM

## 2016-06-28 DIAGNOSIS — L089 Local infection of the skin and subcutaneous tissue, unspecified: Secondary | ICD-10-CM

## 2016-06-28 DIAGNOSIS — C9002 Multiple myeloma in relapse: Secondary | ICD-10-CM

## 2016-06-28 LAB — CMP (CANCER CENTER ONLY)
ALBUMIN: 3.4 g/dL (ref 3.3–5.5)
ALT: 24 U/L (ref 10–47)
AST: 27 U/L (ref 11–38)
Alkaline Phosphatase: 58 U/L (ref 26–84)
BUN, Bld: 18 mg/dL (ref 7–22)
CALCIUM: 8.7 mg/dL (ref 8.0–10.3)
CO2: 28 mEq/L (ref 18–33)
Chloride: 108 mEq/L (ref 98–108)
Creat: 1.1 mg/dl (ref 0.6–1.2)
Glucose, Bld: 84 mg/dL (ref 73–118)
POTASSIUM: 4.1 meq/L (ref 3.3–4.7)
Sodium: 144 mEq/L (ref 128–145)
TOTAL PROTEIN: 7.3 g/dL (ref 6.4–8.1)
Total Bilirubin: 0.7 mg/dl (ref 0.20–1.60)

## 2016-06-28 LAB — CBC WITH DIFFERENTIAL (CANCER CENTER ONLY)
BASO#: 0 10*3/uL (ref 0.0–0.2)
BASO%: 0.4 % (ref 0.0–2.0)
EOS%: 3.2 % (ref 0.0–7.0)
Eosinophils Absolute: 0.1 10*3/uL (ref 0.0–0.5)
HEMATOCRIT: 27.4 % — AB (ref 34.8–46.6)
HEMOGLOBIN: 9.2 g/dL — AB (ref 11.6–15.9)
LYMPH#: 0.9 10*3/uL (ref 0.9–3.3)
LYMPH%: 32.9 % (ref 14.0–48.0)
MCH: 31.5 pg (ref 26.0–34.0)
MCHC: 33.6 g/dL (ref 32.0–36.0)
MCV: 94 fL (ref 81–101)
MONO#: 0.5 10*3/uL (ref 0.1–0.9)
MONO%: 18.2 % — ABNORMAL HIGH (ref 0.0–13.0)
NEUT#: 1.3 10*3/uL — ABNORMAL LOW (ref 1.5–6.5)
NEUT%: 45.3 % (ref 39.6–80.0)
Platelets: 130 10*3/uL — ABNORMAL LOW (ref 145–400)
RBC: 2.92 10*6/uL — ABNORMAL LOW (ref 3.70–5.32)
RDW: 15.3 % (ref 11.1–15.7)
WBC: 2.8 10*3/uL — ABNORMAL LOW (ref 3.9–10.0)

## 2016-06-28 MED ORDER — HEPARIN SOD (PORK) LOCK FLUSH 100 UNIT/ML IV SOLN
500.0000 [IU] | Freq: Once | INTRAVENOUS | Status: AC | PRN
  Administered 2016-06-28: 500 [IU]
  Filled 2016-06-28: qty 5

## 2016-06-28 MED ORDER — SODIUM CHLORIDE 0.9 % IV SOLN: Freq: Once | INTRAVENOUS | Status: DC

## 2016-06-28 MED ORDER — MUPIROCIN CALCIUM 2 % EX CREA
1.0000 | TOPICAL_CREAM | Freq: Two times a day (BID) | CUTANEOUS | 0 refills | Status: DC
Start: 2016-06-28 — End: 2017-03-03

## 2016-06-28 MED ORDER — DEXTROSE 5 % IV SOLN
20.0000 mg/m2 | Freq: Once | INTRAVENOUS | Status: AC
  Administered 2016-06-28: 36 mg via INTRAVENOUS
  Filled 2016-06-28: qty 18

## 2016-06-28 MED ORDER — SODIUM CHLORIDE 0.9% FLUSH
10.0000 mL | INTRAVENOUS | Status: DC | PRN
  Administered 2016-06-28: 10 mL
  Filled 2016-06-28: qty 10

## 2016-06-28 MED ORDER — SODIUM CHLORIDE 0.9 % IV SOLN
Freq: Once | INTRAVENOUS | Status: AC
  Administered 2016-06-28: 09:00:00 via INTRAVENOUS

## 2016-06-28 NOTE — Progress Notes (Signed)
Patient took her po premedications at home today at 0730. Declined taking them here.

## 2016-06-28 NOTE — Progress Notes (Signed)
Patiemt complaining of itching and redness at port a cath site. On examination there is some mild redness along the scar line as well as warmth. No swelling or discharge. She states that it itches but is not painful. I spoke with Dr. Marin Olp and we will have her use Bactroban cream BID for now and see if this helps clear things up.

## 2016-06-28 NOTE — Patient Instructions (Signed)
[facility] Discharge Instructions for Patients Receiving Chemotherapy  Today you received the following chemotherapy agents: Kyprolis.  To help prevent nausea and vomiting after your treatment, we encourage you to take your nausea medication as prescribed by MD. If you develop nausea and vomiting that is not controlled by your nausea medication, call the clinic.   BELOW ARE SYMPTOMS THAT SHOULD BE REPORTED IMMEDIATELY:  *FEVER GREATER THAN 100.5 F  *CHILLS WITH OR WITHOUT FEVER  NAUSEA AND VOMITING THAT IS NOT CONTROLLED WITH YOUR NAUSEA MEDICATION  *UNUSUAL SHORTNESS OF BREATH  *UNUSUAL BRUISING OR BLEEDING  TENDERNESS IN MOUTH AND THROAT WITH OR WITHOUT PRESENCE OF ULCERS  *URINARY PROBLEMS  *BOWEL PROBLEMS  UNUSUAL RASH Items with * indicate a potential emergency and should be followed up as soon as possible.  Feel free to call the clinic you have any questions or concerns. The clinic phone number is [phone].  Please show the CHEMO ALERT CARD at check-in to the Emergency Department and triage nurse.   

## 2016-06-28 NOTE — Progress Notes (Signed)
OK to treat with ANC from today 1.3 per MD Ennever

## 2016-06-29 ENCOUNTER — Ambulatory Visit (HOSPITAL_BASED_OUTPATIENT_CLINIC_OR_DEPARTMENT_OTHER): Payer: 59

## 2016-06-29 VITALS — BP 110/57 | HR 66 | Temp 97.4°F | Resp 17

## 2016-06-29 DIAGNOSIS — Z5112 Encounter for antineoplastic immunotherapy: Secondary | ICD-10-CM | POA: Diagnosis not present

## 2016-06-29 DIAGNOSIS — C9002 Multiple myeloma in relapse: Secondary | ICD-10-CM

## 2016-06-29 MED ORDER — SODIUM CHLORIDE 0.9% FLUSH
10.0000 mL | INTRAVENOUS | Status: DC | PRN
  Administered 2016-06-29: 10 mL
  Filled 2016-06-29: qty 10

## 2016-06-29 MED ORDER — SODIUM CHLORIDE 0.9 % IV SOLN
Freq: Once | INTRAVENOUS | Status: AC
  Administered 2016-06-29: 09:00:00 via INTRAVENOUS

## 2016-06-29 MED ORDER — HEPARIN SOD (PORK) LOCK FLUSH 100 UNIT/ML IV SOLN
500.0000 [IU] | Freq: Once | INTRAVENOUS | Status: AC | PRN
  Administered 2016-06-29: 500 [IU]
  Filled 2016-06-29: qty 5

## 2016-06-29 MED ORDER — SODIUM CHLORIDE 0.9 % IV SOLN: Freq: Once | INTRAVENOUS | Status: DC

## 2016-06-29 MED ORDER — DEXTROSE 5 % IV SOLN
20.0000 mg/m2 | Freq: Once | INTRAVENOUS | Status: AC
  Administered 2016-06-29: 36 mg via INTRAVENOUS
  Filled 2016-06-29: qty 18

## 2016-06-29 NOTE — Patient Instructions (Signed)
[facility] Discharge Instructions for Patients Receiving Chemotherapy  Today you received the following chemotherapy agents: Kyprolis.  To help prevent nausea and vomiting after your treatment, we encourage you to take your nausea medication as prescribed by MD. If you develop nausea and vomiting that is not controlled by your nausea medication, call the clinic.   BELOW ARE SYMPTOMS THAT SHOULD BE REPORTED IMMEDIATELY:  *FEVER GREATER THAN 100.5 F  *CHILLS WITH OR WITHOUT FEVER  NAUSEA AND VOMITING THAT IS NOT CONTROLLED WITH YOUR NAUSEA MEDICATION  *UNUSUAL SHORTNESS OF BREATH  *UNUSUAL BRUISING OR BLEEDING  TENDERNESS IN MOUTH AND THROAT WITH OR WITHOUT PRESENCE OF ULCERS  *URINARY PROBLEMS  *BOWEL PROBLEMS  UNUSUAL RASH Items with * indicate a potential emergency and should be followed up as soon as possible.  Feel free to call the clinic you have any questions or concerns. The clinic phone number is [phone].  Please show the CHEMO ALERT CARD at check-in to the Emergency Department and triage nurse.   

## 2016-06-29 NOTE — Progress Notes (Signed)
Patient took her premedications at home as she always does. NO premeds to be given today prior to treatment

## 2016-07-09 ENCOUNTER — Other Ambulatory Visit: Payer: Self-pay | Admitting: *Deleted

## 2016-07-09 DIAGNOSIS — Z0000 Encounter for general adult medical examination without abnormal findings: Secondary | ICD-10-CM

## 2016-07-09 DIAGNOSIS — C9000 Multiple myeloma not having achieved remission: Secondary | ICD-10-CM

## 2016-07-09 DIAGNOSIS — K2100 Gastro-esophageal reflux disease with esophagitis, without bleeding: Secondary | ICD-10-CM

## 2016-07-12 ENCOUNTER — Other Ambulatory Visit: Payer: 59

## 2016-07-12 ENCOUNTER — Other Ambulatory Visit: Payer: Commercial Managed Care - HMO

## 2016-07-12 ENCOUNTER — Ambulatory Visit: Payer: 59 | Admitting: Hematology & Oncology

## 2016-07-12 ENCOUNTER — Ambulatory Visit: Payer: 59

## 2016-07-13 ENCOUNTER — Ambulatory Visit: Payer: 59

## 2016-07-14 ENCOUNTER — Encounter: Payer: Self-pay | Admitting: Internal Medicine

## 2016-07-14 ENCOUNTER — Ambulatory Visit (INDEPENDENT_AMBULATORY_CARE_PROVIDER_SITE_OTHER): Payer: 59 | Admitting: Internal Medicine

## 2016-07-14 VITALS — BP 126/72 | HR 72 | Ht 63.78 in | Wt 167.2 lb

## 2016-07-14 DIAGNOSIS — R140 Abdominal distension (gaseous): Secondary | ICD-10-CM

## 2016-07-14 DIAGNOSIS — K5900 Constipation, unspecified: Secondary | ICD-10-CM | POA: Diagnosis not present

## 2016-07-14 DIAGNOSIS — R1013 Epigastric pain: Secondary | ICD-10-CM | POA: Diagnosis not present

## 2016-07-14 NOTE — Patient Instructions (Signed)
Please purchase the following medications over the counter and take as directed: Simethicone 250 mg twice daily for gas/bloating. (please call our office if this is not helping) Miralax 17 grams (1 capful) daily  Continue your Protonix.  Please purchase Align over the counter. You should take 1 capsule by mouth once daily.  Follow up as needed.  If you are age 62 or older, your body mass index should be between 23-30. Your Body mass index is 28.91 kg/m. If this is out of the aforementioned range listed, please consider follow up with your Primary Care Provider.  If you are age 85 or younger, your body mass index should be between 19-25. Your Body mass index is 28.91 kg/m. If this is out of the aformentioned range listed, please consider follow up with your Primary Care Provider.

## 2016-07-14 NOTE — Progress Notes (Signed)
Subjective:    Patient ID: Barbara Cook, female    DOB: [birth year], 61 y.o.   MRN: [id number]  HPI Barbara Cook is a 61 year old female with history of GERD, IBS with abdominal bloating, multiple myeloma who is here for follow-up. She was seen last in October 2017. She's had previous upper endoscopy and colonoscopy in August of last year. EGD showed some moderate inflammation with erosions and erythema in the stomach. Biopsies were unremarkable. Her colonoscopy was normal. She's been on pantoprazole since being diagnosed with gastritis. She's been using MiraLAX for constipation.  Recently she's been doing pretty well. She does feel tightness, fullness or upper abdominal bloating which seems to be worse after eating. No nausea or vomiting. She continues the pantoprazole. Bowel movements have been regular and she is using MiraLAX 3 days in a row and then off for several days. She realized if she uses it every day her stools are too loose. She has not had as much constipation recently. No blood in her stool or melena. Her appetite has been good. She has continued Align as a probiotic which she has found helpful.  After her last visit she had a CT scan of the abdomen and pelvis which was unremarkable except for possible constipation due to colonic stool burden.   Review of Systems As per history of present illness otherwise negative  Current Medications, Allergies, Past Medical History, Past Surgical History, Family History and Social History were reviewed in [employer] record.     Objective:   Physical Exam BP 126/72   Pulse 72   Ht 5' 3.78" (1.62 m)   Wt 167 lb 4 oz (75.9 kg)   BMI 28.91 kg/m  Constitutional: Well-developed and well-nourished. No distress. HEENT: Normocephalic and atraumatic.  Conjunctivae are normal.  No scleral icterus. Neck: Neck supple. Trachea midline. Cardiovascular: Normal rate, regular rhythm and intact distal pulses.  Pulmonary/chest:  Effort normal and breath sounds normal. No wheezing, rales or rhonchi. Abdominal: Soft, nontender, nondistended. Bowel sounds active throughout.  Extremities: no clubbing, cyanosis, or edema Neurological: Alert and oriented to person place and time. Skin: Skin is warm and dry.  Psychiatric: Normal Cook and affect. Behavior is normal.  CBC    Component Value Date/Time   WBC 2.8 (L) 06/28/2016 0808   WBC 4.4 04/14/2016 1201   RBC 2.92 (L) 06/28/2016 0808   RBC 3.39 (L) 04/14/2016 1201   HGB 9.2 (L) 06/28/2016 0808   HGB 11.5 (L) 10/26/2005 1453   HCT 27.4 (L) 06/28/2016 0808   HCT 34.8 10/26/2005 1453   PLT 130 (L) 06/28/2016 0808   PLT 177 10/26/2005 1453   MCV 94 06/28/2016 0808   MCV 88.7 10/26/2005 1453   MCH 31.5 06/28/2016 0808   MCH 29.8 04/14/2016 1201   MCHC 33.6 06/28/2016 0808   MCHC 33.8 04/14/2016 1201   RDW 15.3 06/28/2016 0808   RDW 13.5 10/26/2005 1453   LYMPHSABS 0.9 06/28/2016 0808   LYMPHSABS 2.6 10/26/2005 1453   MONOABS 0.5 04/14/2016 1201   MONOABS 0.5 10/26/2005 1453   EOSABS 0.1 06/28/2016 0808   BASOSABS 0.0 06/28/2016 0808   BASOSABS 0.0 10/26/2005 1453   CMP     Component Value Date/Time   NA 144 06/28/2016 0808   NA 139 02/20/2016 1207   K 4.1 06/28/2016 0808   K 3.8 02/20/2016 1207   CL 108 06/28/2016 0808   CO2 28 06/28/2016 0808   CO2 29 02/20/2016 1207   GLUCOSE  84 06/28/2016 0808   BUN 18 06/28/2016 0808   BUN 22.3 02/20/2016 1207   CREATININE 1.1 06/28/2016 0808   CREATININE 1.3 (H) 02/20/2016 1207   CALCIUM 8.7 06/28/2016 0808   CALCIUM 9.7 02/20/2016 1207   PROT 7.3 06/28/2016 0808   PROT 9.4 (H) 02/20/2016 1207   ALBUMIN 3.4 06/28/2016 0808   ALBUMIN 3.5 02/20/2016 1207   AST 27 06/28/2016 0808   AST 27 02/20/2016 1207   ALT 24 06/28/2016 0808   ALT 19 02/20/2016 1207   ALKPHOS 58 06/28/2016 0808   ALKPHOS 62 02/20/2016 1207   BILITOT 0.70 06/28/2016 0808   BILITOT 0.44 02/20/2016 1207   GFRNONAA 64 (L) 08/22/2014  0956   GFRAA 75 (L) 08/22/2014 0956     CT ABDOMEN AND PELVIS WITH CONTRAST   TECHNIQUE: Multidetector CT imaging of the abdomen and pelvis was performed using the standard protocol following bolus administration of intravenous contrast.   CONTRAST:  187m ISOVUE-300 IOPAMIDOL (ISOVUE-300) INJECTION 61%   COMPARISON:  None.   FINDINGS: Lower chest: Clear lung bases. Normal heart size without pericardial or pleural effusion.   Hepatobiliary: Minimal exclusion of hepatic dome. Normal gallbladder. Intrahepatic ducts are borderline prominent. Common duct is normal.   Pancreas: Normal, without mass or ductal dilatation.   Spleen: Normal in size, without focal abnormality.   Adrenals/Urinary Tract: Normal adrenal glands. Normal kidneys, without hydronephrosis. Normal urinary bladder.   Stomach/Bowel: Normal stomach, without wall thickening. Colonic stool burden suggests constipation. Normal terminal ileum. Appendix is not visualized but there is no evidence of right lower quadrant inflammation. Normal small bowel.   Vascular/Lymphatic: Aortic and branch vessel atherosclerosis. No abdominopelvic adenopathy.   Reproductive: Normal uterus and adnexa.   Other: No significant free fluid.   Musculoskeletal: Prior vertebral augmentation at T12, as on prior MRI. Underlying mild to moderate compression deformity. 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   IMPRESSION: 1.  Possible constipation.  No other explanation for abdominal pain. 2.  Aortic atherosclerosis.     Electronically Signed   By: KAbigail MiyamotoM.D.   On: 03/09/2016 17:07       Assessment & Plan:   61year old female with history of GERD, IBS with abdominal bloating, multiple myeloma who is here for follow-up  1. Abdominal bloating -- felt to be a benign symptom after previous workup with upper endoscopy colonoscopy and CT scan of the abdomen. I'm going to  treat symptomatically with simethicone to 50 mg twice a day. She was given samples of Phazyme today. We discussed low bloating and low gas diet. If simethicone is unaffected we could try FDgard.  2. Epigastric pain/GERD -- resolved with pantoprazole. Continue 40 mg daily  3. Constipation -- improved with MiraLAX 17 g 3-4 days per week. She will continue on her dosing regimen which has worked well to avoid constipation. She will also continue daily probiotic  She will follow-up as needed 15 minutes spent with the patient today. Greater than 50% was spent in counseling and coordination of care with the patient

## 2016-07-19 ENCOUNTER — Other Ambulatory Visit (HOSPITAL_BASED_OUTPATIENT_CLINIC_OR_DEPARTMENT_OTHER): Payer: 59

## 2016-07-19 ENCOUNTER — Ambulatory Visit (HOSPITAL_BASED_OUTPATIENT_CLINIC_OR_DEPARTMENT_OTHER): Payer: 59

## 2016-07-19 ENCOUNTER — Ambulatory Visit (HOSPITAL_BASED_OUTPATIENT_CLINIC_OR_DEPARTMENT_OTHER): Payer: 59 | Admitting: Hematology & Oncology

## 2016-07-19 ENCOUNTER — Ambulatory Visit: Payer: 59

## 2016-07-19 DIAGNOSIS — C9000 Multiple myeloma not having achieved remission: Secondary | ICD-10-CM | POA: Diagnosis not present

## 2016-07-19 DIAGNOSIS — C9002 Multiple myeloma in relapse: Secondary | ICD-10-CM | POA: Diagnosis not present

## 2016-07-19 DIAGNOSIS — Z5112 Encounter for antineoplastic immunotherapy: Secondary | ICD-10-CM

## 2016-07-19 LAB — CBC WITH DIFFERENTIAL (CANCER CENTER ONLY)
BASO#: 0 10*3/uL (ref 0.0–0.2)
BASO%: 0.4 % (ref 0.0–2.0)
EOS%: 1.8 % (ref 0.0–7.0)
Eosinophils Absolute: 0.1 10*3/uL (ref 0.0–0.5)
HEMATOCRIT: 28.6 % — AB (ref 34.8–46.6)
HEMOGLOBIN: 9.5 g/dL — AB (ref 11.6–15.9)
LYMPH#: 1.1 10*3/uL (ref 0.9–3.3)
LYMPH%: 38.2 % (ref 14.0–48.0)
MCH: 31.5 pg (ref 26.0–34.0)
MCHC: 33.2 g/dL (ref 32.0–36.0)
MCV: 95 fL (ref 81–101)
MONO#: 0.4 10*3/uL (ref 0.1–0.9)
MONO%: 15.6 % — AB (ref 0.0–13.0)
NEUT%: 44 % (ref 39.6–80.0)
NEUTROS ABS: 1.2 10*3/uL — AB (ref 1.5–6.5)
Platelets: 140 10*3/uL — ABNORMAL LOW (ref 145–400)
RBC: 3.02 10*6/uL — ABNORMAL LOW (ref 3.70–5.32)
RDW: 13.6 % (ref 11.1–15.7)
WBC: 2.8 10*3/uL — ABNORMAL LOW (ref 3.9–10.0)

## 2016-07-19 LAB — CMP (CANCER CENTER ONLY)
ALBUMIN: 3.6 g/dL (ref 3.3–5.5)
ALK PHOS: 57 U/L (ref 26–84)
ALT: 24 U/L (ref 10–47)
AST: 27 U/L (ref 11–38)
BUN, Bld: 19 mg/dL (ref 7–22)
CALCIUM: 9.5 mg/dL (ref 8.0–10.3)
CO2: 28 meq/L (ref 18–33)
CREATININE: 1.2 mg/dL (ref 0.6–1.2)
Chloride: 104 mEq/L (ref 98–108)
GLUCOSE: 97 mg/dL (ref 73–118)
Potassium: 3.7 mEq/L (ref 3.3–4.7)
Sodium: 143 mEq/L (ref 128–145)
Total Bilirubin: 0.7 mg/dl (ref 0.20–1.60)
Total Protein: 7.3 g/dL (ref 6.4–8.1)

## 2016-07-19 MED ORDER — DEXTROSE 5 % IV SOLN
20.0000 mg/m2 | Freq: Once | INTRAVENOUS | Status: AC
  Administered 2016-07-19: 36 mg via INTRAVENOUS
  Filled 2016-07-19: qty 18

## 2016-07-19 MED ORDER — HEPARIN SOD (PORK) LOCK FLUSH 100 UNIT/ML IV SOLN
500.0000 [IU] | Freq: Once | INTRAVENOUS | Status: AC | PRN
  Administered 2016-07-19: 500 [IU]
  Filled 2016-07-19: qty 5

## 2016-07-19 MED ORDER — SODIUM CHLORIDE 0.9% FLUSH
10.0000 mL | INTRAVENOUS | Status: DC | PRN
  Administered 2016-07-19: 10 mL
  Filled 2016-07-19: qty 10

## 2016-07-19 MED ORDER — SODIUM CHLORIDE 0.9 % IV SOLN: Freq: Once | INTRAVENOUS | Status: DC

## 2016-07-19 MED ORDER — SODIUM CHLORIDE 0.9 % IV SOLN
Freq: Once | INTRAVENOUS | Status: AC
  Administered 2016-07-19: 10:00:00 via INTRAVENOUS

## 2016-07-19 NOTE — Progress Notes (Signed)
Patient already took premedications at home - denied any here. OK TO TREAT WITH ANC TODAY PER DR. Marin Olp

## 2016-07-19 NOTE — Patient Instructions (Signed)
[facility] Discharge Instructions for Patients Receiving Chemotherapy  Today you received the following chemotherapy agents: Kyprolis.  To help prevent nausea and vomiting after your treatment, we encourage you to take your nausea medication as prescribed by MD. If you develop nausea and vomiting that is not controlled by your nausea medication, call the clinic.   BELOW ARE SYMPTOMS THAT SHOULD BE REPORTED IMMEDIATELY:  *FEVER GREATER THAN 100.5 F  *CHILLS WITH OR WITHOUT FEVER  NAUSEA AND VOMITING THAT IS NOT CONTROLLED WITH YOUR NAUSEA MEDICATION  *UNUSUAL SHORTNESS OF BREATH  *UNUSUAL BRUISING OR BLEEDING  TENDERNESS IN MOUTH AND THROAT WITH OR WITHOUT PRESENCE OF ULCERS  *URINARY PROBLEMS  *BOWEL PROBLEMS  UNUSUAL RASH Items with * indicate a potential emergency and should be followed up as soon as possible.  Feel free to call the clinic you have any questions or concerns. The clinic phone number is [phone].  Please show the CHEMO ALERT CARD at check-in to the Emergency Department and triage nurse.   

## 2016-07-19 NOTE — Progress Notes (Signed)
Hematology and Oncology Follow Up Visit  Barbara Cook [id number] February 21, 1956 61 y.o. 07/19/2016   Principle Diagnosis:  IgG kappa myeloma   Current Therapy:     Velcade/Revlimid/ -Velcade every 3 week dosing  Ninlaro/Revlimid - start in 11/03/2015 Zometa 4 mg IV every 3 month - next dose in August Kyprolis - start 04/19/2016 - s/p cycle #3     Interim History:  Ms.  Cook is back for a follow-up. She really is doing quite well. She has tolerated Kyprolis incredibly well. She's not had any diarrhea.  Her M spike continues to improve. The spike was 1.2 g/dL back in February. Her IgG level was 2117 milligrams per deciliter. Her Kappa Light chain was 2.4 mg/dL.  She really does not complain much in the way of fatigue or weakness. She has not had any nausea or vomiting. She's had no rashes. There's been no leg swelling.  She does have anemia. She does have a low erythropoietin level. She is not symptomatically so I think we can hold off on Aranesp.  Her overall performance status is ECOG 1  Medications:  Current Outpatient Prescriptions:  .  acetaminophen (TYLENOL) 500 MG tablet, Take 500 mg by mouth every 6 (six) hours as needed for mild pain or headache., Disp: , Rfl:  .  acyclovir (ZOVIRAX) 400 MG tablet, Take 1 tablet (400 mg total) by mouth daily., Disp: 30 tablet, Rfl: 3 .  doxycycline (VIBRA-TABS) 100 MG tablet, Take 1 tablet (100 mg total) by mouth 2 (two) times daily., Disp: 10 tablet, Rfl: 0 .  fluconazole (DIFLUCAN) 200 MG tablet, Take 1 daily if needed for yeast infection, Disp: 3 tablet, Rfl: 0 .  hyoscyamine (LEVSIN SL) 0.125 MG SL tablet, Place 1 tablet (0.125 mg total) under the tongue every 4 (four) hours as needed., Disp: 60 tablet, Rfl: 0 .  KLOR-CON 8 MEQ tablet, 1 TABLET ONCE A DAY ORALLY 30 DAY(S), Disp: , Rfl: 6 .  lidocaine-prilocaine (EMLA) cream, Apply to affected area once, Disp: 30 g, Rfl: 3 .  LORazepam (ATIVAN) 0.5 MG tablet, Take 1 tablet (0.5 mg  total) by mouth every 6 (six) hours as needed (Nausea or vomiting)., Disp: 30 tablet, Rfl: 0 .  magnesium oxide (MAG-OX) 400 (241.3 Mg) MG tablet, TAKE 1 TABLET BY MOUTH TWICE A DAY, Disp: 60 tablet, Rfl: 1 .  Multiple Vitamin (MULTIVITAMIN) tablet, Take 1 tablet by mouth every evening. , Disp: , Rfl:  .  mupirocin cream (BACTROBAN) 2 %, Apply 1 application topically 2 (two) times daily., Disp: 15 g, Rfl: 0 .  ondansetron (ZOFRAN) 8 MG tablet, Take 1 tablet (8 mg total) by mouth 2 (two) times daily. For nausea & vomiting. Take 1 tablet 1 hour prior to chemotherapy., Disp: 20 tablet, Rfl: 0 .  ondansetron (ZOFRAN) 8 MG tablet, Take 1 tablet (8 mg total) by mouth 2 (two) times daily as needed (Nausea or vomiting)., Disp: 30 tablet, Rfl: 1 .  pantoprazole (PROTONIX) 40 MG tablet, Take 1 tablet (40 mg total) by mouth daily., Disp: 90 tablet, Rfl: 3 .  polyethylene glycol powder (GLYCOLAX/MIRALAX) powder, 1 capful daily as needed, Disp: 255 g, Rfl: 3 .  Probiotic Product (ALIGN) 4 MG CAPS, Take 1 capsule by mouth daily., Disp: , Rfl:  .  prochlorperazine (COMPAZINE) 10 MG tablet, Take 1 tablet (10 mg total) by mouth every 6 (six) hours as needed (Nausea or vomiting)., Disp: 30 tablet, Rfl: 1 .  Pyridoxine HCl (VITAMIN B-6) 250 MG  tablet, Take 1 tablet (250 mg total) by mouth daily., Disp: 30 tablet, Rfl: 6 .  spironolactone (ALDACTONE) 50 MG tablet, Take 50 mg by mouth daily., Disp: , Rfl: 4 .  torsemide (DEMADEX) 20 MG tablet, Take 1 tablet (20 mg total) by mouth daily as needed., Disp: 30 tablet, Rfl: 6 .  Vitamin D, Ergocalciferol, (DRISDOL) 50000 units CAPS capsule, TAKE 1 CAPSULE (50,000 UNITS TOTAL) BY MOUTH ONCE A WEEK., Disp: 12 capsule, Rfl: 3 No current facility-administered medications for this visit.   Facility-Administered Medications Ordered in Other Visits:  .  0.9 %  sodium chloride infusion, , Intravenous, Once, [name], MD .  0.9 %  sodium chloride infusion, , Intravenous,  Once, [name], MD .  carfilzomib (KYPROLIS) 36 mg in dextrose 5 % 50 mL chemo infusion, 20 mg/m2 (Treatment Plan Recorded), Intravenous, Once, [name], MD .  heparin lock flush 100 unit/mL, 500 Units, Intracatheter, Once PRN, [name], MD .  sodium chloride flush (NS) 0.9 % injection 10 mL, 10 mL, Intracatheter, PRN, [name], MD  Allergies:  Allergies  Allergen Reactions  . Codeine Palpitations    Past Medical History, Surgical history, Social history, and Family History were reviewed and updated.  Review of Systems: As above  Physical Exam:  vitals were not taken for this visit.  Well-developed and well-nourished African American female. Head and neck exam shows no ocular or oral lesions. There are no palpable cervical or supraclavicular lymph nodes.There is some tenderness to palpation just to the left of the midline at the base of her neck. I cannot palpate any lymph nodes. I cannot palpate her thyroid. She has a good carotid pulse. Lungs are clear. Cardiac exam regular rate and rhythm with no murmurs, rubs or bruits. Abdomen is soft. She is mildly obese. She really has no abdominal distention. She has no palpable liver or spleen tip. Back exam shows no tenderness over the spine, ribs or hips. Extremity shows no clubbing, cyanosis or edema. Skin exam shows no rashes, ecchymoses or petechia. Neurological exam is nonfocal.  Lab Results  Component Value Date   WBC 2.8 (L) 07/19/2016   HGB 9.5 (L) 07/19/2016   HCT 28.6 (L) 07/19/2016   MCV 95 07/19/2016   PLT 140 (L) 07/19/2016     Chemistry      Component Value Date/Time   NA 143 07/19/2016 0903   NA 139 02/20/2016 1207   K 3.7 07/19/2016 0903   K 3.8 02/20/2016 1207   CL 104 07/19/2016 0903   CO2 28 07/19/2016 0903   CO2 29 02/20/2016 1207   BUN 19 07/19/2016 0903   BUN 22.3 02/20/2016 1207   CREATININE 1.2 07/19/2016 0903   CREATININE 1.3 (H) 02/20/2016 1207      Component Value Date/Time     CALCIUM 9.5 07/19/2016 0903   CALCIUM 9.7 02/20/2016 1207   ALKPHOS 57 07/19/2016 0903   ALKPHOS 62 02/20/2016 1207   AST 27 07/19/2016 0903   AST 27 02/20/2016 1207   ALT 24 07/19/2016 0903   ALT 19 02/20/2016 1207   BILITOT 0.70 07/19/2016 0903   BILITOT 0.44 02/20/2016 1207         Impression and Plan: Ms. Lucien is 61 year old African-American female with IgG kappa myeloma.   She is responding very nicely. She's had no toxicity from the lower dose of Kyprolis. As such, we will maintain the dose that she is on.  Once her M spike  is below 1.0 g/dL, then I may switch her to a higher dose but just once a week.  We will see her back in one month.  The fact that her hemoglobin is also improving I think is a good sign. how worsens, then I will see about adding Aranesp.   [name], MD 3/12/201810:11 AM

## 2016-07-20 ENCOUNTER — Ambulatory Visit: Payer: 59

## 2016-07-20 LAB — IGG, IGA, IGM
IgA, Qn, Serum: 86 mg/dL — ABNORMAL LOW (ref 87–352)
IgM, Qn, Serum: 20 mg/dL — ABNORMAL LOW (ref 26–217)

## 2016-07-20 LAB — KAPPA/LAMBDA LIGHT CHAINS
Ig Kappa Free Light Chain: 22.4 mg/L — ABNORMAL HIGH (ref 3.3–19.4)
Ig Lambda Free Light Chain: 8.6 mg/L (ref 5.7–26.3)
Kappa/Lambda FluidC Ratio: 2.6 — ABNORMAL HIGH (ref 0.26–1.65)

## 2016-07-22 LAB — PROTEIN ELECTROPHORESIS, SERUM, WITH REFLEX
A/G Ratio: 1 (ref 0.7–1.7)
ALBUMIN: 3.6 g/dL (ref 2.9–4.4)
ALPHA 1: 0.2 g/dL (ref 0.0–0.4)
ALPHA 2: 0.5 g/dL (ref 0.4–1.0)
BETA: 1 g/dL (ref 0.7–1.3)
Gamma Globulin: 1.8 g/dL (ref 0.4–1.8)
Globulin, Total: 3.5 g/dL (ref 2.2–3.9)
INTERPRETATION(SEE BELOW): 0
M-Spike, %: 0.9 g/dL — ABNORMAL HIGH
Total Protein: 7.1 g/dL (ref 6.0–8.5)

## 2016-07-23 ENCOUNTER — Telehealth: Payer: Self-pay | Admitting: *Deleted

## 2016-07-23 NOTE — Telephone Encounter (Addendum)
Patient is aware of results  ----- Message from [name], MD sent at 07/22/2016  8:12 PM EDT ----- Call - myeloma level is now less than 1.0!!  We can switch to weekly treatments!!!  pete

## 2016-07-26 ENCOUNTER — Other Ambulatory Visit (HOSPITAL_BASED_OUTPATIENT_CLINIC_OR_DEPARTMENT_OTHER): Payer: 59

## 2016-07-26 ENCOUNTER — Ambulatory Visit (HOSPITAL_BASED_OUTPATIENT_CLINIC_OR_DEPARTMENT_OTHER): Payer: 59

## 2016-07-26 ENCOUNTER — Ambulatory Visit: Payer: 59

## 2016-07-26 VITALS — BP 117/46 | HR 72 | Temp 97.6°F | Resp 18

## 2016-07-26 DIAGNOSIS — Z5112 Encounter for antineoplastic immunotherapy: Secondary | ICD-10-CM | POA: Diagnosis not present

## 2016-07-26 DIAGNOSIS — C9000 Multiple myeloma not having achieved remission: Secondary | ICD-10-CM | POA: Diagnosis not present

## 2016-07-26 DIAGNOSIS — K2100 Gastro-esophageal reflux disease with esophagitis, without bleeding: Secondary | ICD-10-CM

## 2016-07-26 DIAGNOSIS — Z0000 Encounter for general adult medical examination without abnormal findings: Secondary | ICD-10-CM

## 2016-07-26 DIAGNOSIS — C9002 Multiple myeloma in relapse: Secondary | ICD-10-CM

## 2016-07-26 LAB — CBC WITH DIFFERENTIAL (CANCER CENTER ONLY)
BASO#: 0 10*3/uL (ref 0.0–0.2)
BASO%: 0.4 % (ref 0.0–2.0)
EOS%: 1.6 % (ref 0.0–7.0)
Eosinophils Absolute: 0 10*3/uL (ref 0.0–0.5)
HEMATOCRIT: 29.4 % — AB (ref 34.8–46.6)
HGB: 9.5 g/dL — ABNORMAL LOW (ref 11.6–15.9)
LYMPH#: 1 10*3/uL (ref 0.9–3.3)
LYMPH%: 39.1 % (ref 14.0–48.0)
MCH: 30.8 pg (ref 26.0–34.0)
MCHC: 32.3 g/dL (ref 32.0–36.0)
MCV: 96 fL (ref 81–101)
MONO#: 0.4 10*3/uL (ref 0.1–0.9)
MONO%: 16.5 % — AB (ref 0.0–13.0)
NEUT%: 42.4 % (ref 39.6–80.0)
NEUTROS ABS: 1.1 10*3/uL — AB (ref 1.5–6.5)
Platelets: 136 10*3/uL — ABNORMAL LOW (ref 145–400)
RBC: 3.08 10*6/uL — ABNORMAL LOW (ref 3.70–5.32)
RDW: 13.1 % (ref 11.1–15.7)
WBC: 2.5 10*3/uL — ABNORMAL LOW (ref 3.9–10.0)

## 2016-07-26 LAB — CMP (CANCER CENTER ONLY)
ALT: 23 U/L (ref 10–47)
AST: 27 U/L (ref 11–38)
Albumin: 3.4 g/dL (ref 3.3–5.5)
Alkaline Phosphatase: 58 U/L (ref 26–84)
BILIRUBIN TOTAL: 0.7 mg/dL (ref 0.20–1.60)
BUN: 18 mg/dL (ref 7–22)
CALCIUM: 9.1 mg/dL (ref 8.0–10.3)
CO2: 27 meq/L (ref 18–33)
Chloride: 105 mEq/L (ref 98–108)
Creat: 1.1 mg/dl (ref 0.6–1.2)
GLUCOSE: 90 mg/dL (ref 73–118)
Potassium: 4.3 mEq/L (ref 3.3–4.7)
SODIUM: 143 meq/L (ref 128–145)
Total Protein: 7.3 g/dL (ref 6.4–8.1)

## 2016-07-26 MED ORDER — DEXTROSE 5 % IV SOLN
20.0000 mg/m2 | Freq: Once | INTRAVENOUS | Status: AC
  Administered 2016-07-26: 36 mg via INTRAVENOUS
  Filled 2016-07-26: qty 18

## 2016-07-26 MED ORDER — HEPARIN SOD (PORK) LOCK FLUSH 100 UNIT/ML IV SOLN
250.0000 [IU] | Freq: Once | INTRAVENOUS | Status: DC | PRN
  Filled 2016-07-26: qty 5

## 2016-07-26 MED ORDER — SODIUM CHLORIDE 0.9% FLUSH
3.0000 mL | INTRAVENOUS | Status: DC | PRN
Start: 2016-07-26 — End: 2016-07-26
  Filled 2016-07-26: qty 10

## 2016-07-26 MED ORDER — SODIUM CHLORIDE 0.9 % IV SOLN: Freq: Once | INTRAVENOUS | Status: DC

## 2016-07-26 MED ORDER — ACETAMINOPHEN 325 MG PO TABS: 650.0000 mg | ORAL_TABLET | Freq: Once | ORAL | Status: DC

## 2016-07-26 MED ORDER — SODIUM CHLORIDE 0.9 % IV SOLN
Freq: Once | INTRAVENOUS | Status: AC
  Administered 2016-07-26: 08:00:00 via INTRAVENOUS

## 2016-07-26 MED ORDER — HEPARIN SOD (PORK) LOCK FLUSH 100 UNIT/ML IV SOLN
500.0000 [IU] | Freq: Once | INTRAVENOUS | Status: DC | PRN
  Filled 2016-07-26: qty 5

## 2016-07-26 MED ORDER — PROCHLORPERAZINE MALEATE 10 MG PO TABS: 10.0000 mg | ORAL_TABLET | Freq: Once | ORAL | Status: DC

## 2016-07-26 MED ORDER — SODIUM CHLORIDE 0.9% FLUSH
10.0000 mL | INTRAVENOUS | Status: DC | PRN
  Filled 2016-07-26: qty 10

## 2016-07-26 MED ORDER — ALTEPLASE 2 MG IJ SOLR
2.0000 mg | Freq: Once | INTRAMUSCULAR | Status: DC | PRN
  Filled 2016-07-26: qty 2

## 2016-07-26 NOTE — Progress Notes (Signed)
OK to treat with ANC of 1.1 per Dr Marin Olp. dph

## 2016-07-26 NOTE — Patient Instructions (Signed)
Carfilzomib injection What is this medicine? CARFILZOMIB (kar FILZ oh mib) targets a specific protein within cancer cells and stops the cancer cells from growing. It is used to treat multiple myeloma. This medicine may be used for other purposes; ask your health care provider or pharmacist if you have questions. COMMON BRAND NAME(S): KYPROLIS What should I tell my health care provider before I take this medicine? They need to know if you have any of these conditions: -heart disease -history of blood clots -irregular heartbeat -kidney disease -liver disease -lung or breathing disease -an unusual or allergic reaction to carfilzomib, or other medicines, foods, dyes, or preservatives -pregnant or trying to get pregnant -breast-feeding How should I use this medicine? This medicine is for injection or infusion into a vein. It is given by a health care professional in a hospital or clinic setting. Talk to your pediatrician regarding the use of this medicine in children. Special care may be needed. Overdosage: If you think you have taken too much of this medicine contact a poison control center or emergency room at once. NOTE: This medicine is only for you. Do not share this medicine with others. What if I miss a dose? It is important not to miss your dose. Call your doctor or health care professional if you are unable to keep an appointment. What may interact with this medicine? Interactions are not expected. Give your health care provider a list of all the medicines, herbs, non-prescription drugs, or dietary supplements you use. Also tell them if you smoke, drink alcohol, or use illegal drugs. Some items may interact with your medicine. This list may not describe all possible interactions. Give your health care provider a list of all the medicines, herbs, non-prescription drugs, or dietary supplements you use. Also tell them if you smoke, drink alcohol, or use illegal drugs. Some items may  interact with your medicine. What should I watch for while using this medicine? Your condition will be monitored carefully while you are receiving this medicine. Report any side effects. Continue your course of treatment even though you feel ill unless your doctor tells you to stop. You may need blood work done while you are taking this medicine. Do not become pregnant while taking this medicine or for at least 30 days after stopping it. Women should inform their doctor if they wish to become pregnant or think they might be pregnant. There is a potential for serious side effects to an unborn child. Men should not father a child while taking this medicine and for 90 days after stopping it. Talk to your health care professional or pharmacist for more information. Do not breast-feed an infant while taking this medicine. Check with your doctor or health care professional if you get an attack of severe diarrhea, nausea and vomiting, or if you sweat a lot. The loss of too much body fluid can make it dangerous for you to take this medicine. You may get dizzy. Do not drive, use machinery, or do anything that needs mental alertness until you know how this medicine affects you. Do not stand or sit up quickly, especially if you are an older patient. This reduces the risk of dizzy or fainting spells. What side effects may I notice from receiving this medicine? Side effects that you should report to your doctor or health care professional as soon as possible: -allergic reactions like skin rash, itching or hives, swelling of the face, lips, or tongue -confusion -dizziness -feeling faint or lightheaded -fever or chills -  palpitations -seizures -signs and symptoms of bleeding such as bloody or black, tarry stools; red or dark-brown urine; spitting up blood or brown material that looks like coffee grounds; red spots on the skin; unusual bruising or bleeding including from the eye, gums, or nose -signs and symptoms of  a blood clot such as breathing problems; changes in vision; chest pain; severe, sudden headache; pain, swelling, warmth in the leg; trouble speaking; sudden numbness or weakness of the face, arm or leg -signs and symptoms of kidney injury like trouble passing urine or change in the amount of urine -signs and symptoms of liver injury like dark yellow or brown urine; general ill feeling or flu-like symptoms; light-colored stools; loss of appetite; nausea; right upper belly pain; unusually weak or tired; yellowing of the eyes or skin Side effects that usually do not require medical attention (report to your doctor or health care professional if they continue or are bothersome): -back pain -cough -diarrhea -headache -muscle cramps -vomiting This list may not describe all possible side effects. Call your doctor for medical advice about side effects. You may report side effects to FDA at 1-800-FDA-1088. Where should I keep my medicine? This drug is given in a hospital or clinic and will not be stored at home. NOTE: This sheet is a summary. It may not cover all possible information. If you have questions about this medicine, talk to your doctor, pharmacist, or health care provider.  2018 Elsevier/Gold Standard (2015-05-29 13:39:23)  

## 2016-07-27 ENCOUNTER — Ambulatory Visit: Payer: 59

## 2016-07-27 ENCOUNTER — Other Ambulatory Visit: Payer: Self-pay | Admitting: Family

## 2016-07-27 LAB — KAPPA/LAMBDA LIGHT CHAINS
IG LAMBDA FREE LIGHT CHAIN: 7.9 mg/L (ref 5.7–26.3)
Ig Kappa Free Light Chain: 23.9 mg/L — ABNORMAL HIGH (ref 3.3–19.4)
Kappa/Lambda FluidC Ratio: 3.03 — ABNORMAL HIGH (ref 0.26–1.65)

## 2016-07-27 LAB — IGG, IGA, IGM
IGA/IMMUNOGLOBULIN A, SERUM: 77 mg/dL — AB (ref 87–352)
IgM, Qn, Serum: 19 mg/dL — ABNORMAL LOW (ref 26–217)

## 2016-07-30 LAB — PROTEIN ELECTROPHORESIS, SERUM, WITH REFLEX
A/G Ratio: 0.9 (ref 0.7–1.7)
ALPHA 2: 0.5 g/dL (ref 0.4–1.0)
Albumin: 3.2 g/dL (ref 2.9–4.4)
Alpha 1: 0.2 g/dL (ref 0.0–0.4)
BETA: 0.9 g/dL (ref 0.7–1.3)
GLOBULIN, TOTAL: 3.4 g/dL (ref 2.2–3.9)
Gamma Globulin: 1.7 g/dL (ref 0.4–1.8)
Interpretation(See Below): 0
M-Spike, %: 1 g/dL — ABNORMAL HIGH
Total Protein: 6.6 g/dL (ref 6.0–8.5)

## 2016-08-02 ENCOUNTER — Ambulatory Visit: Payer: 59

## 2016-08-02 ENCOUNTER — Ambulatory Visit (HOSPITAL_BASED_OUTPATIENT_CLINIC_OR_DEPARTMENT_OTHER): Payer: 59

## 2016-08-02 ENCOUNTER — Other Ambulatory Visit (HOSPITAL_BASED_OUTPATIENT_CLINIC_OR_DEPARTMENT_OTHER): Payer: 59

## 2016-08-02 DIAGNOSIS — Z5112 Encounter for antineoplastic immunotherapy: Secondary | ICD-10-CM | POA: Diagnosis not present

## 2016-08-02 DIAGNOSIS — C9002 Multiple myeloma in relapse: Secondary | ICD-10-CM | POA: Diagnosis not present

## 2016-08-02 DIAGNOSIS — C9000 Multiple myeloma not having achieved remission: Secondary | ICD-10-CM | POA: Diagnosis not present

## 2016-08-02 LAB — CBC WITH DIFFERENTIAL (CANCER CENTER ONLY)
BASO#: 0 10*3/uL (ref 0.0–0.2)
BASO%: 0.4 % (ref 0.0–2.0)
EOS ABS: 0.1 10*3/uL (ref 0.0–0.5)
EOS%: 2.9 % (ref 0.0–7.0)
HCT: 28.9 % — ABNORMAL LOW (ref 34.8–46.6)
HEMOGLOBIN: 9.6 g/dL — AB (ref 11.6–15.9)
LYMPH#: 1.1 10*3/uL (ref 0.9–3.3)
LYMPH%: 38 % (ref 14.0–48.0)
MCH: 31.5 pg (ref 26.0–34.0)
MCHC: 33.2 g/dL (ref 32.0–36.0)
MCV: 95 fL (ref 81–101)
MONO#: 0.5 10*3/uL (ref 0.1–0.9)
MONO%: 16.7 % — AB (ref 0.0–13.0)
NEUT#: 1.2 10*3/uL — ABNORMAL LOW (ref 1.5–6.5)
NEUT%: 42 % (ref 39.6–80.0)
PLATELETS: 138 10*3/uL — AB (ref 145–400)
RBC: 3.05 10*6/uL — ABNORMAL LOW (ref 3.70–5.32)
RDW: 12.7 % (ref 11.1–15.7)
WBC: 2.8 10*3/uL — ABNORMAL LOW (ref 3.9–10.0)

## 2016-08-02 LAB — CMP (CANCER CENTER ONLY)
ALBUMIN: 3.4 g/dL (ref 3.3–5.5)
ALT(SGPT): 24 U/L (ref 10–47)
AST: 24 U/L (ref 11–38)
Alkaline Phosphatase: 57 U/L (ref 26–84)
BUN, Bld: 19 mg/dL (ref 7–22)
CHLORIDE: 103 meq/L (ref 98–108)
CO2: 26 meq/L (ref 18–33)
Calcium: 8.8 mg/dL (ref 8.0–10.3)
Creat: 0.9 mg/dl (ref 0.6–1.2)
Glucose, Bld: 98 mg/dL (ref 73–118)
POTASSIUM: 4.1 meq/L (ref 3.3–4.7)
SODIUM: 143 meq/L (ref 128–145)
TOTAL PROTEIN: 7.2 g/dL (ref 6.4–8.1)
Total Bilirubin: 0.7 mg/dl (ref 0.20–1.60)

## 2016-08-02 MED ORDER — SODIUM CHLORIDE 0.9 % IV SOLN
Freq: Once | INTRAVENOUS | Status: AC
  Administered 2016-08-02: 09:00:00 via INTRAVENOUS

## 2016-08-02 MED ORDER — SODIUM CHLORIDE 0.9% FLUSH
10.0000 mL | INTRAVENOUS | Status: DC | PRN
  Administered 2016-08-02: 10 mL
  Filled 2016-08-02: qty 10

## 2016-08-02 MED ORDER — CARFILZOMIB CHEMO INJECTION 60 MG
20.0000 mg/m2 | Freq: Once | INTRAVENOUS | Status: AC
  Administered 2016-08-02: 36 mg via INTRAVENOUS
  Filled 2016-08-02: qty 18

## 2016-08-02 MED ORDER — HEPARIN SOD (PORK) LOCK FLUSH 100 UNIT/ML IV SOLN
500.0000 [IU] | Freq: Once | INTRAVENOUS | Status: AC | PRN
  Administered 2016-08-02: 500 [IU]
  Filled 2016-08-02: qty 5

## 2016-08-02 NOTE — Patient Instructions (Signed)
[facility] Discharge Instructions for Patients Receiving Chemotherapy  Today you received the following chemotherapy agents: Kyprolis   To help prevent nausea and vomiting after your treatment, we encourage you to take your nausea medication as directed.    If you develop nausea and vomiting that is not controlled by your nausea medication, call the clinic.   BELOW ARE SYMPTOMS THAT SHOULD BE REPORTED IMMEDIATELY:  *FEVER GREATER THAN 100.5 F  *CHILLS WITH OR WITHOUT FEVER  NAUSEA AND VOMITING THAT IS NOT CONTROLLED WITH YOUR NAUSEA MEDICATION  *UNUSUAL SHORTNESS OF BREATH  *UNUSUAL BRUISING OR BLEEDING  TENDERNESS IN MOUTH AND THROAT WITH OR WITHOUT PRESENCE OF ULCERS  *URINARY PROBLEMS  *BOWEL PROBLEMS  UNUSUAL RASH Items with * indicate a potential emergency and should be followed up as soon as possible.  Feel free to call the clinic you have any questions or concerns. The clinic phone number is [phone].  Please show the CHEMO ALERT CARD at check-in to the Emergency Department and triage nurse.   

## 2016-08-02 NOTE — Progress Notes (Signed)
Patient has already taken her premedications at home as she always does. NO premeds to re-administer here.   Ok to treat with ANC 1.2 today per MD Marin Olp

## 2016-08-06 ENCOUNTER — Other Ambulatory Visit: Payer: Self-pay | Admitting: *Deleted

## 2016-08-06 DIAGNOSIS — C9000 Multiple myeloma not having achieved remission: Secondary | ICD-10-CM

## 2016-08-09 ENCOUNTER — Ambulatory Visit (HOSPITAL_BASED_OUTPATIENT_CLINIC_OR_DEPARTMENT_OTHER): Payer: 59

## 2016-08-09 ENCOUNTER — Other Ambulatory Visit (HOSPITAL_BASED_OUTPATIENT_CLINIC_OR_DEPARTMENT_OTHER): Payer: 59

## 2016-08-09 VITALS — BP 127/67 | HR 63 | Temp 97.5°F | Resp 16

## 2016-08-09 DIAGNOSIS — C9002 Multiple myeloma in relapse: Secondary | ICD-10-CM

## 2016-08-09 DIAGNOSIS — Z5112 Encounter for antineoplastic immunotherapy: Secondary | ICD-10-CM | POA: Diagnosis not present

## 2016-08-09 DIAGNOSIS — C9000 Multiple myeloma not having achieved remission: Secondary | ICD-10-CM

## 2016-08-09 LAB — CBC WITH DIFFERENTIAL (CANCER CENTER ONLY)
BASO#: 0 10*3/uL (ref 0.0–0.2)
BASO%: 0.4 % (ref 0.0–2.0)
EOS%: 2.4 % (ref 0.0–7.0)
Eosinophils Absolute: 0.1 10*3/uL (ref 0.0–0.5)
HEMATOCRIT: 27.8 % — AB (ref 34.8–46.6)
HGB: 9.1 g/dL — ABNORMAL LOW (ref 11.6–15.9)
LYMPH#: 1 10*3/uL (ref 0.9–3.3)
LYMPH%: 40.2 % (ref 14.0–48.0)
MCH: 31.5 pg (ref 26.0–34.0)
MCHC: 32.7 g/dL (ref 32.0–36.0)
MCV: 96 fL (ref 81–101)
MONO#: 0.4 10*3/uL (ref 0.1–0.9)
MONO%: 16.7 % — ABNORMAL HIGH (ref 0.0–13.0)
NEUT%: 40.3 % (ref 39.6–80.0)
NEUTROS ABS: 1 10*3/uL — AB (ref 1.5–6.5)
PLATELETS: 126 10*3/uL — AB (ref 145–400)
RBC: 2.89 10*6/uL — AB (ref 3.70–5.32)
RDW: 12.6 % (ref 11.1–15.7)
WBC: 2.5 10*3/uL — AB (ref 3.9–10.0)

## 2016-08-09 LAB — CMP (CANCER CENTER ONLY)
ALT(SGPT): 23 U/L (ref 10–47)
AST: 25 U/L (ref 11–38)
Albumin: 3.4 g/dL (ref 3.3–5.5)
Alkaline Phosphatase: 56 U/L (ref 26–84)
BUN, Bld: 16 mg/dL (ref 7–22)
CO2: 27 meq/L (ref 18–33)
Calcium: 9 mg/dL (ref 8.0–10.3)
Chloride: 110 meq/L — ABNORMAL HIGH (ref 98–108)
Creat: 1.1 mg/dL (ref 0.6–1.2)
Glucose, Bld: 90 mg/dL (ref 73–118)
Potassium: 4.7 meq/L (ref 3.3–4.7)
Sodium: 141 meq/L (ref 128–145)
Total Bilirubin: 0.7 mg/dL (ref 0.20–1.60)
Total Protein: 7.3 g/dL (ref 6.4–8.1)

## 2016-08-09 MED ORDER — HEPARIN SOD (PORK) LOCK FLUSH 100 UNIT/ML IV SOLN
500.0000 [IU] | Freq: Once | INTRAVENOUS | Status: AC | PRN
  Administered 2016-08-09: 500 [IU]
  Filled 2016-08-09: qty 5

## 2016-08-09 MED ORDER — CARFILZOMIB CHEMO INJECTION 60 MG
20.0000 mg/m2 | Freq: Once | INTRAVENOUS | Status: AC
  Administered 2016-08-09: 36 mg via INTRAVENOUS
  Filled 2016-08-09: qty 18

## 2016-08-09 MED ORDER — SODIUM CHLORIDE 0.9 % IV SOLN
Freq: Once | INTRAVENOUS | Status: AC
  Administered 2016-08-09: 09:00:00 via INTRAVENOUS

## 2016-08-09 MED ORDER — SODIUM CHLORIDE 0.9% FLUSH
10.0000 mL | INTRAVENOUS | Status: DC | PRN
  Administered 2016-08-09: 10 mL
  Filled 2016-08-09: qty 10

## 2016-08-09 NOTE — Progress Notes (Signed)
OK to treat with ANC 1.0 today per MD Ennever.  Patient took her premedications at home today - NO medications to be given in clinic before Kyprolis.

## 2016-08-09 NOTE — Patient Instructions (Signed)
[facility] Discharge Instructions for Patients Receiving Chemotherapy  Today you received the following chemotherapy agents: Kyprolis   To help prevent nausea and vomiting after your treatment, we encourage you to take your nausea medication as directed.    If you develop nausea and vomiting that is not controlled by your nausea medication, call the clinic.   BELOW ARE SYMPTOMS THAT SHOULD BE REPORTED IMMEDIATELY:  *FEVER GREATER THAN 100.5 F  *CHILLS WITH OR WITHOUT FEVER  NAUSEA AND VOMITING THAT IS NOT CONTROLLED WITH YOUR NAUSEA MEDICATION  *UNUSUAL SHORTNESS OF BREATH  *UNUSUAL BRUISING OR BLEEDING  TENDERNESS IN MOUTH AND THROAT WITH OR WITHOUT PRESENCE OF ULCERS  *URINARY PROBLEMS  *BOWEL PROBLEMS  UNUSUAL RASH Items with * indicate a potential emergency and should be followed up as soon as possible.  Feel free to call the clinic you have any questions or concerns. The clinic phone number is [phone].  Please show the CHEMO ALERT CARD at check-in to the Emergency Department and triage nurse.   

## 2016-08-13 ENCOUNTER — Other Ambulatory Visit: Payer: Self-pay | Admitting: Family

## 2016-08-13 DIAGNOSIS — C9000 Multiple myeloma not having achieved remission: Secondary | ICD-10-CM

## 2016-08-16 ENCOUNTER — Ambulatory Visit: Payer: 59

## 2016-08-16 ENCOUNTER — Ambulatory Visit (HOSPITAL_BASED_OUTPATIENT_CLINIC_OR_DEPARTMENT_OTHER): Payer: 59 | Admitting: Family

## 2016-08-16 ENCOUNTER — Other Ambulatory Visit (HOSPITAL_BASED_OUTPATIENT_CLINIC_OR_DEPARTMENT_OTHER): Payer: 59

## 2016-08-16 VITALS — BP 118/56 | HR 64 | Temp 97.9°F | Resp 16 | Wt 170.0 lb

## 2016-08-16 DIAGNOSIS — C9000 Multiple myeloma not having achieved remission: Secondary | ICD-10-CM

## 2016-08-16 LAB — CBC WITH DIFFERENTIAL (CANCER CENTER ONLY)
BASO#: 0 10*3/uL (ref 0.0–0.2)
BASO%: 0.4 % (ref 0.0–2.0)
EOS ABS: 0.1 10*3/uL (ref 0.0–0.5)
EOS%: 1.8 % (ref 0.0–7.0)
HEMATOCRIT: 28.7 % — AB (ref 34.8–46.6)
HGB: 9.5 g/dL — ABNORMAL LOW (ref 11.6–15.9)
LYMPH#: 1 10*3/uL (ref 0.9–3.3)
LYMPH%: 36.7 % (ref 14.0–48.0)
MCH: 31.7 pg (ref 26.0–34.0)
MCHC: 33.1 g/dL (ref 32.0–36.0)
MCV: 96 fL (ref 81–101)
MONO#: 0.4 10*3/uL (ref 0.1–0.9)
MONO%: 15.2 % — ABNORMAL HIGH (ref 0.0–13.0)
NEUT#: 1.3 10*3/uL — ABNORMAL LOW (ref 1.5–6.5)
NEUT%: 45.9 % (ref 39.6–80.0)
PLATELETS: 139 10*3/uL — AB (ref 145–400)
RBC: 3 10*6/uL — ABNORMAL LOW (ref 3.70–5.32)
RDW: 13 % (ref 11.1–15.7)
WBC: 2.8 10*3/uL — ABNORMAL LOW (ref 3.9–10.0)

## 2016-08-16 LAB — CMP (CANCER CENTER ONLY)
ALK PHOS: 54 U/L (ref 26–84)
ALT(SGPT): 20 U/L (ref 10–47)
AST: 23 U/L (ref 11–38)
Albumin: 3.5 g/dL (ref 3.3–5.5)
BUN, Bld: 23 mg/dL — ABNORMAL HIGH (ref 7–22)
CALCIUM: 9.2 mg/dL (ref 8.0–10.3)
CHLORIDE: 105 meq/L (ref 98–108)
CO2: 27 mEq/L (ref 18–33)
Creat: 1 mg/dl (ref 0.6–1.2)
Glucose, Bld: 88 mg/dL (ref 73–118)
POTASSIUM: 4 meq/L (ref 3.3–4.7)
Sodium: 139 mEq/L (ref 128–145)
Total Bilirubin: 0.6 mg/dl (ref 0.20–1.60)
Total Protein: 7.4 g/dL (ref 6.4–8.1)

## 2016-08-16 NOTE — Progress Notes (Signed)
Hematology and Oncology Follow Up Visit  Barbara Cook [id number] 1956/02/16 61 y.o. 08/16/2016   Principle Diagnosis:  IgG kappa myeloma  Current Therapy:   Kyprolis every 28 days s/p cycle 4   Interim History:  Barbara Cook is here today with her husband for follow-up. She is currently on her week off and will resume treatment with cycle 5 next week. She continues to do well and has no complaints at this time.  Her M-spike in March was 1.0 g/dL and kappa free light chain was 23.9 mg/L. IgG level was 1,784 mg/dL.  No fever, chills, n/v, cough, rash, dizziness, SOB, chest pain, palpitations, abdominal pain or changes in bowel or bladder habits. She has a history of IBS and will have bouts with constipation and occasional diarrhea. This is "normal" for her and the diarrhea has not worsened.  She had an episode recently with two mouth sores. These resolved with her swishing with warm salt water. She has not had a recurrence.  No swelling or tenderness in her extremities. The neuropathy in her feet is unchanged. She denies having any problems with balance. No falls or syncopal episodes.  No lymphadenopathy found on exam. No episodes of bleeding, bruising or petechiae.  She is staying active riding her stationary bike several times a week.  She has maintained a good appetite and is staying well hydrated. Her weight is stable.    ECOG Performance Status: 1 - Symptomatic but completely ambulatory  Medications:  Allergies as of 08/16/2016      Reactions   Codeine Palpitations      Medication List       Accurate as of 08/16/16 10:20 AM. Always use your most recent med list.          acetaminophen 500 MG tablet Commonly known as:  TYLENOL Take 500 mg by mouth every 6 (six) hours as needed for mild pain or headache.   acyclovir 400 MG tablet Commonly known as:  ZOVIRAX Take 1 tablet (400 mg total) by mouth daily.   ALIGN 4 MG Caps Take 1 capsule by mouth daily.   doxycycline 100 MG  tablet Commonly known as:  VIBRA-TABS Take 1 tablet (100 mg total) by mouth 2 (two) times daily.   fluconazole 200 MG tablet Commonly known as:  DIFLUCAN Take 1 daily if needed for yeast infection   hyoscyamine 0.125 MG SL tablet Commonly known as:  LEVSIN SL Place 1 tablet (0.125 mg total) under the tongue every 4 (four) hours as needed.   KLOR-CON 8 MEQ tablet Generic drug:  potassium chloride 1 TABLET ONCE A DAY ORALLY 30 DAY(S)   lidocaine-prilocaine cream Commonly known as:  EMLA Apply to affected area once   LORazepam 0.5 MG tablet Commonly known as:  ATIVAN Take 1 tablet (0.5 mg total) by mouth every 6 (six) hours as needed (Nausea or vomiting).   magnesium oxide 400 (241.3 Mg) MG tablet Commonly known as:  MAG-OX TAKE 1 TABLET BY MOUTH TWICE A DAY   multivitamin tablet Take 1 tablet by mouth every evening.   mupirocin cream 2 % Commonly known as:  BACTROBAN Apply 1 application topically 2 (two) times daily.   ondansetron 8 MG tablet Commonly known as:  ZOFRAN Take 1 tablet (8 mg total) by mouth 2 (two) times daily. For nausea & vomiting. Take 1 tablet 1 hour prior to chemotherapy.   ondansetron 8 MG tablet Commonly known as:  ZOFRAN Take 1 tablet (8 mg total) by mouth 2 (  two) times daily as needed (Nausea or vomiting).   pantoprazole 40 MG tablet Commonly known as:  PROTONIX Take 1 tablet (40 mg total) by mouth daily.   polyethylene glycol powder powder Commonly known as:  GLYCOLAX/MIRALAX 1 capful daily as needed   prochlorperazine 10 MG tablet Commonly known as:  COMPAZINE Take 1 tablet (10 mg total) by mouth every 6 (six) hours as needed (Nausea or vomiting).   spironolactone 50 MG tablet Commonly known as:  ALDACTONE Take 50 mg by mouth daily.   torsemide 20 MG tablet Commonly known as:  DEMADEX Take 1 tablet (20 mg total) by mouth daily as needed.   vitamin B-6 250 MG tablet Take 1 tablet (250 mg total) by mouth daily.   Vitamin D  (Ergocalciferol) 50000 units Caps capsule Commonly known as:  DRISDOL TAKE 1 CAPSULE (50,000 UNITS TOTAL) BY MOUTH ONCE A WEEK.       Allergies:  Allergies  Allergen Reactions  . Codeine Palpitations    Past Medical History, Surgical history, Social history, and Family History were reviewed and updated.  Review of Systems: All other 10 point review of systems is negative.   Physical Exam:  vitals were not taken for this visit.  Wt Readings from Last 3 Encounters:  07/14/16 167 lb 4 oz (75.9 kg)  06/14/16 163 lb 1.6 oz (74 kg)  05/17/16 169 lb (76.7 kg)    Ocular: Sclerae unicteric, pupils equal, round and reactive to light Ear-nose-throat: Oropharynx clear, dentition fair Lymphatic: No cervical, supraclavicular or axillary adenopathy Lungs no rales or rhonchi, good excursion bilaterally Heart regular rate and rhythm, no murmur appreciated Abd soft, nontender, positive bowel sounds, no liver or spleen tip palpated on exam, no fluid wave MSK no focal spinal tenderness, no joint edema Neuro: non-focal, well-oriented, appropriate affect Breasts: Deferred   Lab Results  Component Value Date   WBC 2.5 (L) 08/09/2016   HGB 9.1 (L) 08/09/2016   HCT 27.8 (L) 08/09/2016   MCV 96 08/09/2016   PLT 126 (L) 08/09/2016   Lab Results  Component Value Date   FERRITIN 102 02/20/2016   IRON 81 02/20/2016   TIBC 316 02/20/2016   UIBC 235 02/20/2016   IRONPCTSAT 26 02/20/2016   Lab Results  Component Value Date   RETICCTPCT 1.1 12/25/2014   RBC 2.89 (L) 08/09/2016   RETICCTABS 35.9 12/25/2014   Lab Results  Component Value Date   KPAFRELGTCHN 7.11 (H) 05/07/2015   LAMBDASER 1.65 05/07/2015   KAPLAMBRATIO 3.03 (H) 07/26/2016   Lab Results  Component Value Date   IGGSERUM 1,784 (H) 07/26/2016   IGA 116 05/07/2015   IGMSERUM 19 (L) 07/26/2016   Lab Results  Component Value Date   TOTALPROTELP 7.8 05/07/2015   ALBUMINELP 4.2 05/07/2015   A1GS 0.3 05/07/2015   A2GS  0.8 05/07/2015   BETS 0.4 05/07/2015   BETA2SER 0.3 05/07/2015   GAMS 1.9 (H) 05/07/2015   MSPIKE 1.0 (H) 07/26/2016   SPEI * 05/07/2015     Chemistry      Component Value Date/Time   NA 141 08/09/2016 0808   NA 139 02/20/2016 1207   K 4.7 08/09/2016 0808   K 3.8 02/20/2016 1207   CL 110 (H) 08/09/2016 0808   CO2 27 08/09/2016 0808   CO2 29 02/20/2016 1207   BUN 16 08/09/2016 0808   BUN 22.3 02/20/2016 1207   CREATININE 1.1 08/09/2016 0808   CREATININE 1.3 (H) 02/20/2016 1207      Component  Value Date/Time   CALCIUM 9.0 08/09/2016 0808   CALCIUM 9.7 02/20/2016 1207   ALKPHOS 56 08/09/2016 0808   ALKPHOS 62 02/20/2016 1207   AST 25 08/09/2016 0808   AST 27 02/20/2016 1207   ALT 23 08/09/2016 0808   ALT 19 02/20/2016 1207   BILITOT 0.70 08/09/2016 0808   BILITOT 0.44 02/20/2016 1207     Impression and Plan: Ms. Cobaugh is 61 yo African American female with IgG kappa myeloma. She has had a nice response so far to low dose Kyprolis. Her protein studies have been coming down nicely. M-spike was 1.0 g/dL in March. We will recheck her counts next week.  We discussed the possibility of transplant (what the process entails, benefits and risks) and she is still unsure about this. In the past she has not wanted to have this done. She states that she will consider it.  We will plan to see her back next week for treatment and in 1 month for follow-up. She has her current treatment and appointment schedule.  I spent at least 25 minute face to face with the patient and her husband counseling.  They both know to contact our office with any questions or concerns. We can certainly see her sooner if need be.   Eliezer Bottom, NP 4/9/201810:20 AM

## 2016-08-16 NOTE — Patient Instructions (Signed)
Implanted Port Home Guide An implanted port is a type of central line that is placed under the skin. Central lines are used to provide IV access when treatment or nutrition needs to be given through a person's veins. Implanted ports are used for long-term IV access. An implanted port may be placed because:  You need IV medicine that would be irritating to the small veins in your hands or arms.  You need long-term IV medicines, such as antibiotics.  You need IV nutrition for a long period.  You need frequent blood draws for lab tests.  You need dialysis.  Implanted ports are usually placed in the chest area, but they can also be placed in the upper arm, the abdomen, or the leg. An implanted port has two main parts:  Reservoir. The reservoir is round and will appear as a small, raised area under your skin. The reservoir is the part where a needle is inserted to give medicines or draw blood.  Catheter. The catheter is a thin, flexible tube that extends from the reservoir. The catheter is placed into a large vein. Medicine that is inserted into the reservoir goes into the catheter and then into the vein.  How will I care for my incision site? Do not get the incision site wet. Bathe or shower as directed by your health care provider. How is my port accessed? Special steps must be taken to access the port:  Before the port is accessed, a numbing cream can be placed on the skin. This helps numb the skin over the port site.  Your health care provider uses a sterile technique to access the port. ? Your health care provider must put on a mask and sterile gloves. ? The skin over your port is cleaned carefully with an antiseptic and allowed to dry. ? The port is gently pinched between sterile gloves, and a needle is inserted into the port.  Only "non-coring" port needles should be used to access the port. Once the port is accessed, a blood return should be checked. This helps ensure that the port  is in the vein and is not clogged.  If your port needs to remain accessed for a constant infusion, a clear (transparent) bandage will be placed over the needle site. The bandage and needle will need to be changed every week, or as directed by your health care provider.  Keep the bandage covering the needle clean and dry. Do not get it wet. Follow your health care provider's instructions on how to take a shower or bath while the port is accessed.  If your port does not need to stay accessed, no bandage is needed over the port.  What is flushing? Flushing helps keep the port from getting clogged. Follow your health care provider's instructions on how and when to flush the port. Ports are usually flushed with saline solution or a medicine called heparin. The need for flushing will depend on how the port is used.  If the port is used for intermittent medicines or blood draws, the port will need to be flushed: ? After medicines have been given. ? After blood has been drawn. ? As part of routine maintenance.  If a constant infusion is running, the port may not need to be flushed.  How long will my port stay implanted? The port can stay in for as long as your health care provider thinks it is needed. When it is time for the port to come out, surgery will be   done to remove it. The procedure is similar to the one performed when the port was put in. When should I seek immediate medical care? When you have an implanted port, you should seek immediate medical care if:  You notice a bad smell coming from the incision site.  You have swelling, redness, or drainage at the incision site.  You have more swelling or pain at the port site or the surrounding area.  You have a fever that is not controlled with medicine.  This information is not intended to replace advice given to you by your health care provider. Make sure you discuss any questions you have with your health care provider. Document  Released: 04/26/2005 Document Revised: 10/02/2015 Document Reviewed: 01/01/2013 Elsevier Interactive Patient Education  2017 [employer]  

## 2016-08-20 ENCOUNTER — Other Ambulatory Visit: Payer: Self-pay | Admitting: Hematology & Oncology

## 2016-08-20 DIAGNOSIS — C9002 Multiple myeloma in relapse: Secondary | ICD-10-CM

## 2016-08-23 ENCOUNTER — Other Ambulatory Visit (HOSPITAL_BASED_OUTPATIENT_CLINIC_OR_DEPARTMENT_OTHER): Payer: 59

## 2016-08-23 ENCOUNTER — Ambulatory Visit (HOSPITAL_BASED_OUTPATIENT_CLINIC_OR_DEPARTMENT_OTHER): Payer: 59

## 2016-08-23 VITALS — BP 122/63 | HR 73 | Temp 97.5°F | Resp 16

## 2016-08-23 DIAGNOSIS — Z5112 Encounter for antineoplastic immunotherapy: Secondary | ICD-10-CM

## 2016-08-23 DIAGNOSIS — C9000 Multiple myeloma not having achieved remission: Secondary | ICD-10-CM | POA: Diagnosis not present

## 2016-08-23 DIAGNOSIS — C9002 Multiple myeloma in relapse: Secondary | ICD-10-CM

## 2016-08-23 LAB — CMP (CANCER CENTER ONLY)
ALBUMIN: 3.5 g/dL (ref 3.3–5.5)
ALT(SGPT): 26 U/L (ref 10–47)
AST: 28 U/L (ref 11–38)
Alkaline Phosphatase: 55 U/L (ref 26–84)
BUN, Bld: 26 mg/dL — ABNORMAL HIGH (ref 7–22)
CHLORIDE: 105 meq/L (ref 98–108)
CO2: 29 meq/L (ref 18–33)
CREATININE: 1.1 mg/dL (ref 0.6–1.2)
Calcium: 9.4 mg/dL (ref 8.0–10.3)
GLUCOSE: 98 mg/dL (ref 73–118)
Potassium: 3.8 mEq/L (ref 3.3–4.7)
SODIUM: 142 meq/L (ref 128–145)
Total Bilirubin: 0.7 mg/dl (ref 0.20–1.60)
Total Protein: 7.4 g/dL (ref 6.4–8.1)

## 2016-08-23 LAB — CBC WITH DIFFERENTIAL (CANCER CENTER ONLY)
BASO#: 0 10*3/uL (ref 0.0–0.2)
BASO%: 0.6 % (ref 0.0–2.0)
EOS%: 1.9 % (ref 0.0–7.0)
Eosinophils Absolute: 0.1 10*3/uL (ref 0.0–0.5)
HCT: 28.9 % — ABNORMAL LOW (ref 34.8–46.6)
HEMOGLOBIN: 9.6 g/dL — AB (ref 11.6–15.9)
LYMPH#: 1 10*3/uL (ref 0.9–3.3)
LYMPH%: 30.5 % (ref 14.0–48.0)
MCH: 31.7 pg (ref 26.0–34.0)
MCHC: 33.2 g/dL (ref 32.0–36.0)
MCV: 95 fL (ref 81–101)
MONO#: 0.3 10*3/uL (ref 0.1–0.9)
MONO%: 10.5 % (ref 0.0–13.0)
NEUT#: 1.8 10*3/uL (ref 1.5–6.5)
NEUT%: 56.5 % (ref 39.6–80.0)
Platelets: 156 10*3/uL (ref 145–400)
RBC: 3.03 10*6/uL — ABNORMAL LOW (ref 3.70–5.32)
RDW: 12.9 % (ref 11.1–15.7)
WBC: 3.2 10*3/uL — ABNORMAL LOW (ref 3.9–10.0)

## 2016-08-23 MED ORDER — DEXTROSE 5 % IV SOLN
20.0000 mg/m2 | Freq: Once | INTRAVENOUS | Status: AC
  Administered 2016-08-23: 36 mg via INTRAVENOUS
  Filled 2016-08-23: qty 18

## 2016-08-23 MED ORDER — HEPARIN SOD (PORK) LOCK FLUSH 100 UNIT/ML IV SOLN
500.0000 [IU] | Freq: Once | INTRAVENOUS | Status: AC | PRN
  Administered 2016-08-23: 500 [IU]
  Filled 2016-08-23: qty 5

## 2016-08-23 MED ORDER — SODIUM CHLORIDE 0.9% FLUSH
10.0000 mL | INTRAVENOUS | Status: DC | PRN
  Administered 2016-08-23: 10 mL
  Filled 2016-08-23: qty 10

## 2016-08-23 MED ORDER — SODIUM CHLORIDE 0.9 % IV SOLN
Freq: Once | INTRAVENOUS | Status: AC
  Administered 2016-08-23: 09:00:00 via INTRAVENOUS

## 2016-08-23 NOTE — Patient Instructions (Signed)
[facility] Discharge Instructions for Patients Receiving Chemotherapy  Today you received the following chemotherapy agents: Kyprolis   To help prevent nausea and vomiting after your treatment, we encourage you to take your nausea medication as directed.    If you develop nausea and vomiting that is not controlled by your nausea medication, call the clinic.   BELOW ARE SYMPTOMS THAT SHOULD BE REPORTED IMMEDIATELY:  *FEVER GREATER THAN 100.5 F  *CHILLS WITH OR WITHOUT FEVER  NAUSEA AND VOMITING THAT IS NOT CONTROLLED WITH YOUR NAUSEA MEDICATION  *UNUSUAL SHORTNESS OF BREATH  *UNUSUAL BRUISING OR BLEEDING  TENDERNESS IN MOUTH AND THROAT WITH OR WITHOUT PRESENCE OF ULCERS  *URINARY PROBLEMS  *BOWEL PROBLEMS  UNUSUAL RASH Items with * indicate a potential emergency and should be followed up as soon as possible.  Feel free to call the clinic you have any questions or concerns. The clinic phone number is [phone].  Please show the CHEMO ALERT CARD at check-in to the Emergency Department and triage nurse.   

## 2016-08-23 NOTE — Progress Notes (Signed)
Patient took her premedications at home jpta

## 2016-08-24 LAB — KAPPA/LAMBDA LIGHT CHAINS
IG KAPPA FREE LIGHT CHAIN: 29.7 mg/L — AB (ref 3.3–19.4)
IG LAMBDA FREE LIGHT CHAIN: 8.7 mg/L (ref 5.7–26.3)
KAPPA/LAMBDA FLC RATIO: 3.41 — AB (ref 0.26–1.65)

## 2016-08-26 LAB — MULTIPLE MYELOMA PANEL, SERUM
ALBUMIN/GLOB SERPL: 1 (ref 0.7–1.7)
Albumin SerPl Elph-Mcnc: 3.5 g/dL (ref 2.9–4.4)
Alpha 1: 0.2 g/dL (ref 0.0–0.4)
Alpha2 Glob SerPl Elph-Mcnc: 0.6 g/dL (ref 0.4–1.0)
B-GLOBULIN SERPL ELPH-MCNC: 0.9 g/dL (ref 0.7–1.3)
GAMMA GLOB SERPL ELPH-MCNC: 1.9 g/dL — AB (ref 0.4–1.8)
GLOBULIN, TOTAL: 3.7 g/dL (ref 2.2–3.9)
IgA, Qn, Serum: 76 mg/dL — ABNORMAL LOW (ref 87–352)
IgM, Qn, Serum: 19 mg/dL — ABNORMAL LOW (ref 26–217)
M PROTEIN SERPL ELPH-MCNC: 1.1 g/dL — AB
TOTAL PROTEIN: 7.2 g/dL (ref 6.0–8.5)

## 2016-08-27 ENCOUNTER — Other Ambulatory Visit: Payer: Self-pay | Admitting: *Deleted

## 2016-08-27 DIAGNOSIS — C9000 Multiple myeloma not having achieved remission: Secondary | ICD-10-CM

## 2016-08-30 ENCOUNTER — Other Ambulatory Visit (HOSPITAL_BASED_OUTPATIENT_CLINIC_OR_DEPARTMENT_OTHER): Payer: 59

## 2016-08-30 ENCOUNTER — Ambulatory Visit (HOSPITAL_BASED_OUTPATIENT_CLINIC_OR_DEPARTMENT_OTHER): Payer: 59

## 2016-08-30 VITALS — BP 129/64 | HR 64 | Temp 98.1°F | Resp 18

## 2016-08-30 DIAGNOSIS — C9000 Multiple myeloma not having achieved remission: Secondary | ICD-10-CM

## 2016-08-30 DIAGNOSIS — Z5112 Encounter for antineoplastic immunotherapy: Secondary | ICD-10-CM

## 2016-08-30 DIAGNOSIS — C9002 Multiple myeloma in relapse: Secondary | ICD-10-CM | POA: Diagnosis not present

## 2016-08-30 LAB — CBC WITH DIFFERENTIAL (CANCER CENTER ONLY)
BASO#: 0 10*3/uL (ref 0.0–0.2)
BASO%: 0.8 % (ref 0.0–2.0)
EOS%: 2.8 % (ref 0.0–7.0)
Eosinophils Absolute: 0.1 10*3/uL (ref 0.0–0.5)
HCT: 28.8 % — ABNORMAL LOW (ref 34.8–46.6)
HEMOGLOBIN: 9.5 g/dL — AB (ref 11.6–15.9)
LYMPH#: 1 10*3/uL (ref 0.9–3.3)
LYMPH%: 38.5 % (ref 14.0–48.0)
MCH: 31.3 pg (ref 26.0–34.0)
MCHC: 33 g/dL (ref 32.0–36.0)
MCV: 95 fL (ref 81–101)
MONO#: 0.4 10*3/uL (ref 0.1–0.9)
MONO%: 15.5 % — ABNORMAL HIGH (ref 0.0–13.0)
NEUT%: 42.4 % (ref 39.6–80.0)
NEUTROS ABS: 1.1 10*3/uL — AB (ref 1.5–6.5)
Platelets: 148 10*3/uL (ref 145–400)
RBC: 3.04 10*6/uL — ABNORMAL LOW (ref 3.70–5.32)
RDW: 12.6 % (ref 11.1–15.7)
WBC: 2.5 10*3/uL — ABNORMAL LOW (ref 3.9–10.0)

## 2016-08-30 LAB — CMP (CANCER CENTER ONLY)
ALBUMIN: 3.4 g/dL (ref 3.3–5.5)
ALK PHOS: 50 U/L (ref 26–84)
ALT: 23 U/L (ref 10–47)
AST: 25 U/L (ref 11–38)
BILIRUBIN TOTAL: 0.6 mg/dL (ref 0.20–1.60)
BUN, Bld: 21 mg/dL (ref 7–22)
CALCIUM: 8.8 mg/dL (ref 8.0–10.3)
CO2: 28 mEq/L (ref 18–33)
CREATININE: 0.8 mg/dL (ref 0.6–1.2)
Chloride: 108 mEq/L (ref 98–108)
GLUCOSE: 83 mg/dL (ref 73–118)
Potassium: 3.8 mEq/L (ref 3.3–4.7)
SODIUM: 142 meq/L (ref 128–145)
Total Protein: 7.3 g/dL (ref 6.4–8.1)

## 2016-08-30 MED ORDER — DEXTROSE 5 % IV SOLN
20.0000 mg/m2 | Freq: Once | INTRAVENOUS | Status: AC
  Administered 2016-08-30: 36 mg via INTRAVENOUS
  Filled 2016-08-30: qty 18

## 2016-08-30 MED ORDER — SODIUM CHLORIDE 0.9% FLUSH
10.0000 mL | INTRAVENOUS | Status: DC | PRN
  Administered 2016-08-30: 10 mL
  Filled 2016-08-30: qty 10

## 2016-08-30 MED ORDER — HEPARIN SOD (PORK) LOCK FLUSH 100 UNIT/ML IV SOLN
500.0000 [IU] | Freq: Once | INTRAVENOUS | Status: AC | PRN
  Administered 2016-08-30: 500 [IU]
  Filled 2016-08-30: qty 5

## 2016-08-30 MED ORDER — SODIUM CHLORIDE 0.9 % IV SOLN
Freq: Once | INTRAVENOUS | Status: AC
  Administered 2016-08-30: 10:00:00 via INTRAVENOUS

## 2016-08-30 NOTE — Patient Instructions (Signed)
[facility] Discharge Instructions for Patients Receiving Chemotherapy  Today you received the following chemotherapy agents: Kyprolis   To help prevent nausea and vomiting after your treatment, we encourage you to take your nausea medication as directed.    If you develop nausea and vomiting that is not controlled by your nausea medication, call the clinic.   BELOW ARE SYMPTOMS THAT SHOULD BE REPORTED IMMEDIATELY:  *FEVER GREATER THAN 100.5 F  *CHILLS WITH OR WITHOUT FEVER  NAUSEA AND VOMITING THAT IS NOT CONTROLLED WITH YOUR NAUSEA MEDICATION  *UNUSUAL SHORTNESS OF BREATH  *UNUSUAL BRUISING OR BLEEDING  TENDERNESS IN MOUTH AND THROAT WITH OR WITHOUT PRESENCE OF ULCERS  *URINARY PROBLEMS  *BOWEL PROBLEMS  UNUSUAL RASH Items with * indicate a potential emergency and should be followed up as soon as possible.  Feel free to call the clinic you have any questions or concerns. The clinic phone number is [phone].  Please show the CHEMO ALERT CARD at check-in to the Emergency Department and triage nurse.   

## 2016-08-30 NOTE — Progress Notes (Signed)
Patient took her premedications at home this am at 0730. OK to treat with ANC today

## 2016-09-03 ENCOUNTER — Other Ambulatory Visit: Payer: Self-pay | Admitting: *Deleted

## 2016-09-03 DIAGNOSIS — C9000 Multiple myeloma not having achieved remission: Secondary | ICD-10-CM

## 2016-09-06 ENCOUNTER — Other Ambulatory Visit (HOSPITAL_BASED_OUTPATIENT_CLINIC_OR_DEPARTMENT_OTHER): Payer: 59

## 2016-09-06 ENCOUNTER — Ambulatory Visit (HOSPITAL_BASED_OUTPATIENT_CLINIC_OR_DEPARTMENT_OTHER): Payer: 59

## 2016-09-06 VITALS — BP 132/68 | HR 70 | Temp 96.0°F | Resp 16

## 2016-09-06 DIAGNOSIS — Z5112 Encounter for antineoplastic immunotherapy: Secondary | ICD-10-CM | POA: Diagnosis not present

## 2016-09-06 DIAGNOSIS — C9000 Multiple myeloma not having achieved remission: Secondary | ICD-10-CM | POA: Diagnosis not present

## 2016-09-06 DIAGNOSIS — C9002 Multiple myeloma in relapse: Secondary | ICD-10-CM | POA: Diagnosis not present

## 2016-09-06 DIAGNOSIS — C9001 Multiple myeloma in remission: Secondary | ICD-10-CM

## 2016-09-06 LAB — CMP (CANCER CENTER ONLY)
ALT(SGPT): 24 U/L (ref 10–47)
AST: 28 U/L (ref 11–38)
Albumin: 3.4 g/dL (ref 3.3–5.5)
Alkaline Phosphatase: 50 U/L (ref 26–84)
BUN, Bld: 17 mg/dL (ref 7–22)
CALCIUM: 8.8 mg/dL (ref 8.0–10.3)
CO2: 27 meq/L (ref 18–33)
Chloride: 106 mEq/L (ref 98–108)
Creat: 1.1 mg/dl (ref 0.6–1.2)
GLUCOSE: 97 mg/dL (ref 73–118)
POTASSIUM: 4.2 meq/L (ref 3.3–4.7)
Sodium: 142 mEq/L (ref 128–145)
Total Bilirubin: 0.7 mg/dl (ref 0.20–1.60)
Total Protein: 7.6 g/dL (ref 6.4–8.1)

## 2016-09-06 LAB — CBC WITH DIFFERENTIAL (CANCER CENTER ONLY)
BASO#: 0 10*3/uL (ref 0.0–0.2)
BASO%: 0.4 % (ref 0.0–2.0)
EOS%: 1.8 % (ref 0.0–7.0)
Eosinophils Absolute: 0.1 10*3/uL (ref 0.0–0.5)
HEMATOCRIT: 29.8 % — AB (ref 34.8–46.6)
HGB: 9.9 g/dL — ABNORMAL LOW (ref 11.6–15.9)
LYMPH#: 1 10*3/uL (ref 0.9–3.3)
LYMPH%: 37.4 % (ref 14.0–48.0)
MCH: 31.5 pg (ref 26.0–34.0)
MCHC: 33.2 g/dL (ref 32.0–36.0)
MCV: 95 fL (ref 81–101)
MONO#: 0.3 10*3/uL (ref 0.1–0.9)
MONO%: 11.7 % (ref 0.0–13.0)
NEUT#: 1.3 10*3/uL — ABNORMAL LOW (ref 1.5–6.5)
NEUT%: 48.7 % (ref 39.6–80.0)
Platelets: 122 10*3/uL — ABNORMAL LOW (ref 145–400)
RBC: 3.14 10*6/uL — ABNORMAL LOW (ref 3.70–5.32)
RDW: 12.8 % (ref 11.1–15.7)
WBC: 2.7 10*3/uL — ABNORMAL LOW (ref 3.9–10.0)

## 2016-09-06 MED ORDER — HEPARIN SOD (PORK) LOCK FLUSH 100 UNIT/ML IV SOLN
500.0000 [IU] | Freq: Once | INTRAVENOUS | Status: AC
  Administered 2016-09-06: 500 [IU] via INTRAVENOUS
  Filled 2016-09-06: qty 5

## 2016-09-06 MED ORDER — HEPARIN SOD (PORK) LOCK FLUSH 100 UNIT/ML IV SOLN
500.0000 [IU] | Freq: Once | INTRAVENOUS | Status: DC | PRN
  Filled 2016-09-06: qty 5

## 2016-09-06 MED ORDER — SODIUM CHLORIDE 0.9% FLUSH
10.0000 mL | INTRAVENOUS | Status: DC | PRN
  Administered 2016-09-06: 10 mL via INTRAVENOUS
  Filled 2016-09-06: qty 10

## 2016-09-06 MED ORDER — SODIUM CHLORIDE 0.9 % IV SOLN
Freq: Once | INTRAVENOUS | Status: AC
  Administered 2016-09-06: 12:00:00 via INTRAVENOUS

## 2016-09-06 MED ORDER — SODIUM CHLORIDE 0.9 % IV SOLN: Freq: Once | INTRAVENOUS | Status: AC

## 2016-09-06 MED ORDER — DEXTROSE 5 % IV SOLN
20.0000 mg/m2 | Freq: Once | INTRAVENOUS | Status: AC
  Administered 2016-09-06: 36 mg via INTRAVENOUS
  Filled 2016-09-06: qty 18

## 2016-09-06 MED ORDER — SODIUM CHLORIDE 0.9% FLUSH
10.0000 mL | INTRAVENOUS | Status: DC | PRN
  Filled 2016-09-06: qty 10

## 2016-09-07 DIAGNOSIS — Z13220 Encounter for screening for lipoid disorders: Secondary | ICD-10-CM | POA: Diagnosis not present

## 2016-09-07 DIAGNOSIS — Z0000 Encounter for general adult medical examination without abnormal findings: Secondary | ICD-10-CM | POA: Diagnosis not present

## 2016-09-07 DIAGNOSIS — N390 Urinary tract infection, site not specified: Secondary | ICD-10-CM | POA: Diagnosis not present

## 2016-09-07 DIAGNOSIS — E559 Vitamin D deficiency, unspecified: Secondary | ICD-10-CM | POA: Diagnosis not present

## 2016-09-13 ENCOUNTER — Other Ambulatory Visit: Payer: 59

## 2016-09-13 ENCOUNTER — Ambulatory Visit: Payer: 59

## 2016-09-13 ENCOUNTER — Ambulatory Visit: Payer: 59 | Admitting: Hematology & Oncology

## 2016-09-14 DIAGNOSIS — E875 Hyperkalemia: Secondary | ICD-10-CM | POA: Diagnosis not present

## 2016-09-14 DIAGNOSIS — Z0001 Encounter for general adult medical examination with abnormal findings: Secondary | ICD-10-CM | POA: Diagnosis not present

## 2016-09-20 ENCOUNTER — Other Ambulatory Visit: Payer: 59

## 2016-09-20 ENCOUNTER — Ambulatory Visit: Payer: 59

## 2016-09-22 ENCOUNTER — Encounter: Payer: Self-pay | Admitting: Gynecology

## 2016-09-24 ENCOUNTER — Other Ambulatory Visit: Payer: Self-pay | Admitting: *Deleted

## 2016-09-24 DIAGNOSIS — C9000 Multiple myeloma not having achieved remission: Secondary | ICD-10-CM

## 2016-09-27 ENCOUNTER — Other Ambulatory Visit (HOSPITAL_BASED_OUTPATIENT_CLINIC_OR_DEPARTMENT_OTHER): Payer: 59

## 2016-09-27 ENCOUNTER — Ambulatory Visit: Payer: 59

## 2016-09-27 ENCOUNTER — Ambulatory Visit (HOSPITAL_BASED_OUTPATIENT_CLINIC_OR_DEPARTMENT_OTHER): Payer: 59 | Admitting: Family

## 2016-09-27 ENCOUNTER — Other Ambulatory Visit: Payer: 59

## 2016-09-27 ENCOUNTER — Ambulatory Visit (HOSPITAL_BASED_OUTPATIENT_CLINIC_OR_DEPARTMENT_OTHER): Payer: 59

## 2016-09-27 VITALS — BP 122/57 | HR 68 | Temp 97.8°F | Resp 18 | Wt 170.1 lb

## 2016-09-27 DIAGNOSIS — C9002 Multiple myeloma in relapse: Secondary | ICD-10-CM | POA: Diagnosis not present

## 2016-09-27 DIAGNOSIS — C9000 Multiple myeloma not having achieved remission: Secondary | ICD-10-CM

## 2016-09-27 DIAGNOSIS — Z5112 Encounter for antineoplastic immunotherapy: Secondary | ICD-10-CM | POA: Diagnosis not present

## 2016-09-27 LAB — CMP (CANCER CENTER ONLY)
ALBUMIN: 3.4 g/dL (ref 3.3–5.5)
ALK PHOS: 47 U/L (ref 26–84)
ALT: 29 U/L (ref 10–47)
AST: 28 U/L (ref 11–38)
BILIRUBIN TOTAL: 0.6 mg/dL (ref 0.20–1.60)
BUN, Bld: 18 mg/dL (ref 7–22)
CO2: 25 mEq/L (ref 18–33)
CREATININE: 0.9 mg/dL (ref 0.6–1.2)
Calcium: 8.8 mg/dL (ref 8.0–10.3)
Chloride: 107 mEq/L (ref 98–108)
Glucose, Bld: 94 mg/dL (ref 73–118)
Potassium: 4.1 mEq/L (ref 3.3–4.7)
SODIUM: 139 meq/L (ref 128–145)
TOTAL PROTEIN: 7.5 g/dL (ref 6.4–8.1)

## 2016-09-27 LAB — CBC WITH DIFFERENTIAL (CANCER CENTER ONLY)
BASO#: 0 10*3/uL (ref 0.0–0.2)
BASO%: 0.7 % (ref 0.0–2.0)
EOS ABS: 0.1 10*3/uL (ref 0.0–0.5)
EOS%: 2.1 % (ref 0.0–7.0)
HEMATOCRIT: 30.5 % — AB (ref 34.8–46.6)
HEMOGLOBIN: 10.1 g/dL — AB (ref 11.6–15.9)
LYMPH#: 1.1 10*3/uL (ref 0.9–3.3)
LYMPH%: 37.4 % (ref 14.0–48.0)
MCH: 31 pg (ref 26.0–34.0)
MCHC: 33.1 g/dL (ref 32.0–36.0)
MCV: 94 fL (ref 81–101)
MONO#: 0.4 10*3/uL (ref 0.1–0.9)
MONO%: 14.9 % — AB (ref 0.0–13.0)
NEUT%: 44.9 % (ref 39.6–80.0)
NEUTROS ABS: 1.3 10*3/uL — AB (ref 1.5–6.5)
PLATELETS: 128 10*3/uL — AB (ref 145–400)
RBC: 3.26 10*6/uL — AB (ref 3.70–5.32)
RDW: 12.8 % (ref 11.1–15.7)
WBC: 2.9 10*3/uL — AB (ref 3.9–10.0)

## 2016-09-27 MED ORDER — DEXTROSE 5 % IV SOLN
20.0000 mg/m2 | Freq: Once | INTRAVENOUS | Status: AC
  Administered 2016-09-27: 36 mg via INTRAVENOUS
  Filled 2016-09-27: qty 18

## 2016-09-27 MED ORDER — SODIUM CHLORIDE 0.9 % IV SOLN
Freq: Once | INTRAVENOUS | Status: AC
  Administered 2016-09-27: 10:00:00 via INTRAVENOUS

## 2016-09-27 MED ORDER — SODIUM CHLORIDE 0.9 % IV SOLN
Freq: Once | INTRAVENOUS | Status: DC
Start: 2016-09-27 — End: 2016-09-27

## 2016-09-27 MED ORDER — SODIUM CHLORIDE 0.9% FLUSH
10.0000 mL | INTRAVENOUS | Status: DC | PRN
  Administered 2016-09-27: 10 mL
  Filled 2016-09-27: qty 10

## 2016-09-27 MED ORDER — HEPARIN SOD (PORK) LOCK FLUSH 100 UNIT/ML IV SOLN
500.0000 [IU] | Freq: Once | INTRAVENOUS | Status: AC | PRN
  Administered 2016-09-27: 500 [IU]
  Filled 2016-09-27: qty 5

## 2016-09-27 NOTE — Patient Instructions (Signed)
[facility] Discharge Instructions for Patients Receiving Chemotherapy  Today you received the following chemotherapy agents: Kyprolis   To help prevent nausea and vomiting after your treatment, we encourage you to take your nausea medication as directed.    If you develop nausea and vomiting that is not controlled by your nausea medication, call the clinic.   BELOW ARE SYMPTOMS THAT SHOULD BE REPORTED IMMEDIATELY:  *FEVER GREATER THAN 100.5 F  *CHILLS WITH OR WITHOUT FEVER  NAUSEA AND VOMITING THAT IS NOT CONTROLLED WITH YOUR NAUSEA MEDICATION  *UNUSUAL SHORTNESS OF BREATH  *UNUSUAL BRUISING OR BLEEDING  TENDERNESS IN MOUTH AND THROAT WITH OR WITHOUT PRESENCE OF ULCERS  *URINARY PROBLEMS  *BOWEL PROBLEMS  UNUSUAL RASH Items with * indicate a potential emergency and should be followed up as soon as possible.  Feel free to call the clinic you have any questions or concerns. The clinic phone number is [phone].  Please show the CHEMO ALERT CARD at check-in to the Emergency Department and triage nurse.   

## 2016-09-27 NOTE — Progress Notes (Signed)
Hematology and Oncology Follow Up Visit  Barbara Cook [id number] 1955/08/31 61 y.o. 09/27/2016   Principle Diagnosis:  IgG kappa myeloma  Current Therapy:   Kyprolis every 28 days s/p cycle 4   Interim History:  Barbara Cook is here today with her husband for follow-up. She is doing quite well and has no complaints at this time. We enjoyed talking about the [employer] wedding. She loves [employer].  M-spike in April was 1.1 g/dL, IgG level was in 1,861 mg/dL and kappa free light chain was 29.7 mg/L. She continue to tolerate treatment with Kyprolis nicely.  No fever, chills, n/v, cough, oral sores, rash, dizziness, headache, SOB, chest pain, palpitations, abdominal pain or changes in bowel or bladder habits.  She has history of IBS at baseline and this is unchanged.  She has unchanged puffiness, no edema, in her feet and ankles that waxes and wanes. This improves with putting up her feet. No numbness or tingling in her extremities at this time.  She has had some tenderness over her outer ankle bone of the left foot that comes and goes. No redness or heat at the site. She will try icing this and taking Advil in moderation and see if this helps.  She has maintained a good appetite and is staying well hydrated. Her weight is stable.   ECOG Performance Status: 1 - Symptomatic but completely ambulatory  Medications:  Allergies as of 09/27/2016      Reactions   Codeine Palpitations      Medication List       Accurate as of 09/27/16  9:09 AM. Always use your most recent med list.          acetaminophen 500 MG tablet Commonly known as:  TYLENOL Take 500 mg by mouth every 6 (six) hours as needed for mild pain or headache.   acyclovir 400 MG tablet Commonly known as:  ZOVIRAX Take 1 tablet (400 mg total) by mouth daily.   ALIGN 4 MG Caps Take 1 capsule by mouth daily.   doxycycline 100 MG tablet Commonly known as:  VIBRA-TABS Take 1 tablet (100 mg total) by mouth 2 (two) times  daily.   fluconazole 200 MG tablet Commonly known as:  DIFLUCAN Take 1 daily if needed for yeast infection   hyoscyamine 0.125 MG SL tablet Commonly known as:  LEVSIN SL Place 1 tablet (0.125 mg total) under the tongue every 4 (four) hours as needed.   KLOR-CON 8 MEQ tablet Generic drug:  potassium chloride 1 TABLET ONCE A DAY ORALLY 30 DAY(S)   lidocaine-prilocaine cream Commonly known as:  EMLA Apply to affected area once   LORazepam 0.5 MG tablet Commonly known as:  ATIVAN Take 1 tablet (0.5 mg total) by mouth every 6 (six) hours as needed (Nausea or vomiting).   magnesium oxide 400 (241.3 Mg) MG tablet Commonly known as:  MAG-OX TAKE 1 TABLET BY MOUTH TWICE A DAY   multivitamin tablet Take 1 tablet by mouth every evening.   mupirocin cream 2 % Commonly known as:  BACTROBAN Apply 1 application topically 2 (two) times daily.   ondansetron 8 MG tablet Commonly known as:  ZOFRAN Take 1 tablet (8 mg total) by mouth 2 (two) times daily. For nausea & vomiting. Take 1 tablet 1 hour prior to chemotherapy.   ondansetron 8 MG tablet Commonly known as:  ZOFRAN TAKE 1 TABLET BY MOUTH TWICE A DAY AS NEEDED FOR NAUSEA AND VOMITING   pantoprazole 40 MG tablet Commonly  known as:  PROTONIX Take 1 tablet (40 mg total) by mouth daily.   polyethylene glycol powder powder Commonly known as:  GLYCOLAX/MIRALAX 1 capful daily as needed   prochlorperazine 10 MG tablet Commonly known as:  COMPAZINE Take 1 tablet (10 mg total) by mouth every 6 (six) hours as needed (Nausea or vomiting).   spironolactone 50 MG tablet Commonly known as:  ALDACTONE Take 50 mg by mouth daily.   torsemide 20 MG tablet Commonly known as:  DEMADEX Take 1 tablet (20 mg total) by mouth daily as needed.   vitamin B-6 250 MG tablet Take 1 tablet (250 mg total) by mouth daily.   Vitamin D (Ergocalciferol) 50000 units Caps capsule Commonly known as:  DRISDOL TAKE 1 CAPSULE (50,000 UNITS TOTAL) BY MOUTH  ONCE A WEEK.       Allergies:  Allergies  Allergen Reactions  . Codeine Palpitations    Past Medical History, Surgical history, Social history, and Family History were reviewed and updated.  Review of Systems: All other 10 point review of systems is negative.   Physical Exam:  weight is 170 lb 1.9 oz (77.2 kg). Her oral temperature is 97.8 F (36.6 C). Her blood pressure is 122/57 (abnormal) and her pulse is 68. Her respiration is 18 and oxygen saturation is 100%.   Wt Readings from Last 3 Encounters:  09/27/16 170 lb 1.9 oz (77.2 kg)  08/16/16 170 lb (77.1 kg)  07/14/16 167 lb 4 oz (75.9 kg)    Ocular: Sclerae unicteric, pupils equal, round and reactive to light Ear-nose-throat: Oropharynx clear, dentition fair Lymphatic: No cervical, supraclavicular or axillary adenopathy Lungs no rales or rhonchi, good excursion bilaterally Heart regular rate and rhythm, no murmur appreciated Abd soft, nontender, positive bowel sounds, no liver or spleen tip palpated on exam, no fluid wave MSK no focal spinal tenderness, no joint edema Neuro: non-focal, well-oriented, appropriate affect Breasts: Deferred   Lab Results  Component Value Date   WBC 2.7 (L) 09/06/2016   HGB 9.9 (L) 09/06/2016   HCT 29.8 (L) 09/06/2016   MCV 95 09/06/2016   PLT 122 (L) 09/06/2016   Lab Results  Component Value Date   FERRITIN 102 02/20/2016   IRON 81 02/20/2016   TIBC 316 02/20/2016   UIBC 235 02/20/2016   IRONPCTSAT 26 02/20/2016   Lab Results  Component Value Date   RETICCTPCT 1.1 12/25/2014   RBC 3.14 (L) 09/06/2016   RETICCTABS 35.9 12/25/2014   Lab Results  Component Value Date   KPAFRELGTCHN 7.11 (H) 05/07/2015   LAMBDASER 1.65 05/07/2015   KAPLAMBRATIO 3.41 (H) 08/23/2016   Lab Results  Component Value Date   IGGSERUM 1,861 (H) 08/23/2016   IGA 116 05/07/2015   IGMSERUM 19 (L) 08/23/2016   Lab Results  Component Value Date   TOTALPROTELP 7.8 05/07/2015   ALBUMINELP 4.2  05/07/2015   A1GS 0.3 05/07/2015   A2GS 0.8 05/07/2015   BETS 0.4 05/07/2015   BETA2SER 0.3 05/07/2015   GAMS 1.9 (H) 05/07/2015   MSPIKE 1.0 (H) 07/26/2016   SPEI * 05/07/2015     Chemistry      Component Value Date/Time   NA 142 09/06/2016 1034   NA 139 02/20/2016 1207   K 4.2 09/06/2016 1034   K 3.8 02/20/2016 1207   CL 106 09/06/2016 1034   CO2 27 09/06/2016 1034   CO2 29 02/20/2016 1207   BUN 17 09/06/2016 1034   BUN 22.3 02/20/2016 1207   CREATININE 1.1 09/06/2016  1034   CREATININE 1.3 (H) 02/20/2016 1207      Component Value Date/Time   CALCIUM 8.8 09/06/2016 1034   CALCIUM 9.7 02/20/2016 1207   ALKPHOS 50 09/06/2016 1034   ALKPHOS 62 02/20/2016 1207   AST 28 09/06/2016 1034   AST 27 02/20/2016 1207   ALT 24 09/06/2016 1034   ALT 19 02/20/2016 1207   BILITOT 0.70 09/06/2016 1034   BILITOT 0.44 02/20/2016 1207      Impression and Plan: Ms. Bernat is a very pleasant 61 yo African American female with IgG myeloma. She continues to tolerate treatment with Kyprolis well and has had a nice response. Hgb is now up to 10.1, ANC stable at 1.4.  We will proceed with treatment today as planned per Dr. Marin Olp. Myeloma studies for today are pending.  She has her current treatment and appointment schedule and will be back for follow-up and treatment the first week of June.  Both she and her husband know to contact our office with any questions or concerns. We can certainly see him sooner if need be.   Eliezer Bottom, NP 5/21/20189:09 AM

## 2016-09-27 NOTE — Progress Notes (Signed)
Patient took her premedications at home this am already. OK to treat today with ANC 1.3 per MD Ennever.

## 2016-09-27 NOTE — Patient Instructions (Signed)
Implanted Port Home Guide An implanted port is a type of central line that is placed under the skin. Central lines are used to provide IV access when treatment or nutrition needs to be given through a person's veins. Implanted ports are used for long-term IV access. An implanted port may be placed because:  You need IV medicine that would be irritating to the small veins in your hands or arms.  You need long-term IV medicines, such as antibiotics.  You need IV nutrition for a long period.  You need frequent blood draws for lab tests.  You need dialysis.  Implanted ports are usually placed in the chest area, but they can also be placed in the upper arm, the abdomen, or the leg. An implanted port has two main parts:  Reservoir. The reservoir is round and will appear as a small, raised area under your skin. The reservoir is the part where a needle is inserted to give medicines or draw blood.  Catheter. The catheter is a thin, flexible tube that extends from the reservoir. The catheter is placed into a large vein. Medicine that is inserted into the reservoir goes into the catheter and then into the vein.  How will I care for my incision site? Do not get the incision site wet. Bathe or shower as directed by your health care provider. How is my port accessed? Special steps must be taken to access the port:  Before the port is accessed, a numbing cream can be placed on the skin. This helps numb the skin over the port site.  Your health care provider uses a sterile technique to access the port. ? Your health care provider must put on a mask and sterile gloves. ? The skin over your port is cleaned carefully with an antiseptic and allowed to dry. ? The port is gently pinched between sterile gloves, and a needle is inserted into the port.  Only "non-coring" port needles should be used to access the port. Once the port is accessed, a blood return should be checked. This helps ensure that the port  is in the vein and is not clogged.  If your port needs to remain accessed for a constant infusion, a clear (transparent) bandage will be placed over the needle site. The bandage and needle will need to be changed every week, or as directed by your health care provider.  Keep the bandage covering the needle clean and dry. Do not get it wet. Follow your health care provider's instructions on how to take a shower or bath while the port is accessed.  If your port does not need to stay accessed, no bandage is needed over the port.  What is flushing? Flushing helps keep the port from getting clogged. Follow your health care provider's instructions on how and when to flush the port. Ports are usually flushed with saline solution or a medicine called heparin. The need for flushing will depend on how the port is used.  If the port is used for intermittent medicines or blood draws, the port will need to be flushed: ? After medicines have been given. ? After blood has been drawn. ? As part of routine maintenance.  If a constant infusion is running, the port may not need to be flushed.  How long will my port stay implanted? The port can stay in for as long as your health care provider thinks it is needed. When it is time for the port to come out, surgery will be   done to remove it. The procedure is similar to the one performed when the port was put in. When should I seek immediate medical care? When you have an implanted port, you should seek immediate medical care if:  You notice a bad smell coming from the incision site.  You have swelling, redness, or drainage at the incision site.  You have more swelling or pain at the port site or the surrounding area.  You have a fever that is not controlled with medicine.  This information is not intended to replace advice given to you by your health care provider. Make sure you discuss any questions you have with your health care provider. Document  Released: 04/26/2005 Document Revised: 10/02/2015 Document Reviewed: 01/01/2013 Elsevier Interactive Patient Education  2017 [employer]  

## 2016-09-28 ENCOUNTER — Ambulatory Visit (INDEPENDENT_AMBULATORY_CARE_PROVIDER_SITE_OTHER): Payer: Self-pay | Admitting: Orthopedic Surgery

## 2016-09-28 LAB — KAPPA/LAMBDA LIGHT CHAINS
IG KAPPA FREE LIGHT CHAIN: 40.1 mg/L — AB (ref 3.3–19.4)
Ig Lambda Free Light Chain: 8 mg/L (ref 5.7–26.3)
KAPPA/LAMBDA FLC RATIO: 5.01 — AB (ref 0.26–1.65)

## 2016-09-30 LAB — MULTIPLE MYELOMA PANEL, SERUM
ALBUMIN/GLOB SERPL: 0.9 (ref 0.7–1.7)
Albumin SerPl Elph-Mcnc: 3.3 g/dL (ref 2.9–4.4)
Alpha 1: 0.3 g/dL (ref 0.0–0.4)
Alpha2 Glob SerPl Elph-Mcnc: 0.6 g/dL (ref 0.4–1.0)
B-Globulin SerPl Elph-Mcnc: 0.9 g/dL (ref 0.7–1.3)
GAMMA GLOB SERPL ELPH-MCNC: 2.1 g/dL — AB (ref 0.4–1.8)
Globulin, Total: 3.9 g/dL (ref 2.2–3.9)
IGA/IMMUNOGLOBULIN A, SERUM: 72 mg/dL — AB (ref 87–352)
IGM (IMMUNOGLOBIN M), SRM: 20 mg/dL — AB (ref 26–217)
IgG, Qn, Serum: 2337 mg/dL — ABNORMAL HIGH (ref 700–1600)
M Protein SerPl Elph-Mcnc: 1.5 g/dL — ABNORMAL HIGH
Total Protein: 7.2 g/dL (ref 6.0–8.5)

## 2016-10-01 ENCOUNTER — Other Ambulatory Visit: Payer: Self-pay | Admitting: *Deleted

## 2016-10-01 DIAGNOSIS — C9000 Multiple myeloma not having achieved remission: Secondary | ICD-10-CM

## 2016-10-05 ENCOUNTER — Ambulatory Visit (HOSPITAL_BASED_OUTPATIENT_CLINIC_OR_DEPARTMENT_OTHER): Payer: 59

## 2016-10-05 ENCOUNTER — Other Ambulatory Visit (HOSPITAL_BASED_OUTPATIENT_CLINIC_OR_DEPARTMENT_OTHER): Payer: 59

## 2016-10-05 VITALS — BP 128/67 | HR 62 | Temp 97.5°F | Resp 18

## 2016-10-05 DIAGNOSIS — C9000 Multiple myeloma not having achieved remission: Secondary | ICD-10-CM

## 2016-10-05 DIAGNOSIS — C9002 Multiple myeloma in relapse: Secondary | ICD-10-CM

## 2016-10-05 DIAGNOSIS — Z5112 Encounter for antineoplastic immunotherapy: Secondary | ICD-10-CM | POA: Diagnosis not present

## 2016-10-05 LAB — CMP (CANCER CENTER ONLY)
ALT(SGPT): 26 U/L (ref 10–47)
AST: 31 U/L (ref 11–38)
Albumin: 3.2 g/dL — ABNORMAL LOW (ref 3.3–5.5)
Alkaline Phosphatase: 42 U/L (ref 26–84)
BUN, Bld: 20 mg/dL (ref 7–22)
CO2: 27 meq/L (ref 18–33)
Calcium: 8.9 mg/dL (ref 8.0–10.3)
Chloride: 107 mEq/L (ref 98–108)
Creat: 0.8 mg/dl (ref 0.6–1.2)
GLUCOSE: 91 mg/dL (ref 73–118)
POTASSIUM: 4.2 meq/L (ref 3.3–4.7)
Sodium: 141 mEq/L (ref 128–145)
Total Bilirubin: 0.7 mg/dl (ref 0.20–1.60)
Total Protein: 7.5 g/dL (ref 6.4–8.1)

## 2016-10-05 LAB — CBC WITH DIFFERENTIAL (CANCER CENTER ONLY)
BASO#: 0 10*3/uL (ref 0.0–0.2)
BASO%: 0.3 % (ref 0.0–2.0)
EOS%: 2.3 % (ref 0.0–7.0)
Eosinophils Absolute: 0.1 10*3/uL (ref 0.0–0.5)
HEMATOCRIT: 29.2 % — AB (ref 34.8–46.6)
HGB: 9.8 g/dL — ABNORMAL LOW (ref 11.6–15.9)
LYMPH#: 0.9 10*3/uL (ref 0.9–3.3)
LYMPH%: 30.7 % (ref 14.0–48.0)
MCH: 30.9 pg (ref 26.0–34.0)
MCHC: 33.6 g/dL (ref 32.0–36.0)
MCV: 92 fL (ref 81–101)
MONO#: 0.4 10*3/uL (ref 0.1–0.9)
MONO%: 12.4 % (ref 0.0–13.0)
NEUT#: 1.7 10*3/uL (ref 1.5–6.5)
NEUT%: 54.3 % (ref 39.6–80.0)
PLATELETS: 116 10*3/uL — AB (ref 145–400)
RBC: 3.17 10*6/uL — ABNORMAL LOW (ref 3.70–5.32)
RDW: 12.7 % (ref 11.1–15.7)
WBC: 3.1 10*3/uL — ABNORMAL LOW (ref 3.9–10.0)

## 2016-10-05 MED ORDER — ACETAMINOPHEN 325 MG PO TABS
ORAL_TABLET | ORAL | Status: AC
  Filled 2016-10-05: qty 2

## 2016-10-05 MED ORDER — SODIUM CHLORIDE 0.9% FLUSH
10.0000 mL | INTRAVENOUS | Status: DC | PRN
  Administered 2016-10-05: 10 mL
  Filled 2016-10-05: qty 10

## 2016-10-05 MED ORDER — SODIUM CHLORIDE 0.9 % IV SOLN
Freq: Once | INTRAVENOUS | Status: AC
  Administered 2016-10-05: 09:00:00 via INTRAVENOUS

## 2016-10-05 MED ORDER — HEPARIN SOD (PORK) LOCK FLUSH 100 UNIT/ML IV SOLN
500.0000 [IU] | Freq: Once | INTRAVENOUS | Status: AC | PRN
  Administered 2016-10-05: 500 [IU]
  Filled 2016-10-05: qty 5

## 2016-10-05 MED ORDER — POMALIDOMIDE 3 MG PO CAPS: 3.0000 mg | ORAL_CAPSULE | Freq: Every day | ORAL | 6 refills | Status: DC

## 2016-10-05 MED ORDER — PROCHLORPERAZINE MALEATE 10 MG PO TABS
ORAL_TABLET | ORAL | Status: AC
  Filled 2016-10-05: qty 1

## 2016-10-05 MED ORDER — DEXTROSE 5 % IV SOLN
60.0000 mg | Freq: Once | INTRAVENOUS | Status: AC
  Administered 2016-10-05: 60 mg via INTRAVENOUS
  Filled 2016-10-05: qty 30

## 2016-10-05 NOTE — Patient Instructions (Signed)
[facility] Discharge Instructions for Patients Receiving Chemotherapy  Today you received the following chemotherapy agents: Kyprolis   To help prevent nausea and vomiting after your treatment, we encourage you to take your nausea medication as directed.    If you develop nausea and vomiting that is not controlled by your nausea medication, call the clinic.   BELOW ARE SYMPTOMS THAT SHOULD BE REPORTED IMMEDIATELY:  *FEVER GREATER THAN 100.5 F  *CHILLS WITH OR WITHOUT FEVER  NAUSEA AND VOMITING THAT IS NOT CONTROLLED WITH YOUR NAUSEA MEDICATION  *UNUSUAL SHORTNESS OF BREATH  *UNUSUAL BRUISING OR BLEEDING  TENDERNESS IN MOUTH AND THROAT WITH OR WITHOUT PRESENCE OF ULCERS  *URINARY PROBLEMS  *BOWEL PROBLEMS  UNUSUAL RASH Items with * indicate a potential emergency and should be followed up as soon as possible.  Feel free to call the clinic you have any questions or concerns. The clinic phone number is [phone].  Please show the CHEMO ALERT CARD at check-in to the Emergency Department and triage nurse.   

## 2016-10-07 ENCOUNTER — Telehealth: Payer: Self-pay | Admitting: *Deleted

## 2016-10-07 NOTE — Telephone Encounter (Signed)
Patient c/o headache since Wednesday morning. She had an increased dose of Kyprolis on Tuesday and believes this to be the cause. She is treating with Tylenol 500mg  q4h. She also admits to some nausea Tuesday night to Wednesday morning, and not having good fluid intake.  Reviewed with the patient that she may be dehydrated from not drinking, and this could definitely be the cause of her headache. Also reviewed symptoms with Dr Marin Olp. He states that patient needs to increase fluid intake and can alternate the tylenol with advil or aleve.   Patient given instructions. She confirmed with teach back.

## 2016-10-11 ENCOUNTER — Ambulatory Visit (HOSPITAL_BASED_OUTPATIENT_CLINIC_OR_DEPARTMENT_OTHER): Payer: 59 | Admitting: Hematology & Oncology

## 2016-10-11 ENCOUNTER — Ambulatory Visit (HOSPITAL_BASED_OUTPATIENT_CLINIC_OR_DEPARTMENT_OTHER): Payer: 59

## 2016-10-11 ENCOUNTER — Other Ambulatory Visit (HOSPITAL_BASED_OUTPATIENT_CLINIC_OR_DEPARTMENT_OTHER): Payer: 59

## 2016-10-11 VITALS — BP 116/46 | HR 74 | Temp 98.1°F | Resp 16 | Wt 168.0 lb

## 2016-10-11 DIAGNOSIS — Z5112 Encounter for antineoplastic immunotherapy: Secondary | ICD-10-CM | POA: Diagnosis not present

## 2016-10-11 DIAGNOSIS — C9000 Multiple myeloma not having achieved remission: Secondary | ICD-10-CM

## 2016-10-11 DIAGNOSIS — C9002 Multiple myeloma in relapse: Secondary | ICD-10-CM | POA: Diagnosis not present

## 2016-10-11 LAB — CBC WITH DIFFERENTIAL (CANCER CENTER ONLY)
BASO#: 0 10*3/uL (ref 0.0–0.2)
BASO%: 0 % (ref 0.0–2.0)
EOS%: 2.2 % (ref 0.0–7.0)
Eosinophils Absolute: 0.1 10*3/uL (ref 0.0–0.5)
HEMATOCRIT: 29.8 % — AB (ref 34.8–46.6)
HEMOGLOBIN: 10 g/dL — AB (ref 11.6–15.9)
LYMPH#: 1 10*3/uL (ref 0.9–3.3)
LYMPH%: 35.6 % (ref 14.0–48.0)
MCH: 30.8 pg (ref 26.0–34.0)
MCHC: 33.6 g/dL (ref 32.0–36.0)
MCV: 92 fL (ref 81–101)
MONO#: 0.4 10*3/uL (ref 0.1–0.9)
MONO%: 15.5 % — ABNORMAL HIGH (ref 0.0–13.0)
NEUT%: 46.7 % (ref 39.6–80.0)
NEUTROS ABS: 1.3 10*3/uL — AB (ref 1.5–6.5)
Platelets: 110 10*3/uL — ABNORMAL LOW (ref 145–400)
RBC: 3.25 10*6/uL — AB (ref 3.70–5.32)
RDW: 12.8 % (ref 11.1–15.7)
WBC: 2.8 10*3/uL — ABNORMAL LOW (ref 3.9–10.0)

## 2016-10-11 LAB — CMP (CANCER CENTER ONLY)
ALBUMIN: 3.3 g/dL (ref 3.3–5.5)
ALK PHOS: 43 U/L (ref 26–84)
ALT(SGPT): 30 U/L (ref 10–47)
AST: 29 U/L (ref 11–38)
BILIRUBIN TOTAL: 0.8 mg/dL (ref 0.20–1.60)
BUN, Bld: 17 mg/dL (ref 7–22)
CALCIUM: 8.9 mg/dL (ref 8.0–10.3)
CO2: 29 mEq/L (ref 18–33)
Chloride: 104 mEq/L (ref 98–108)
Creat: 1 mg/dl (ref 0.6–1.2)
Glucose, Bld: 101 mg/dL (ref 73–118)
POTASSIUM: 3.7 meq/L (ref 3.3–4.7)
Sodium: 141 mEq/L (ref 128–145)
TOTAL PROTEIN: 7.8 g/dL (ref 6.4–8.1)

## 2016-10-11 MED ORDER — SODIUM CHLORIDE 0.9 % IV SOLN: Freq: Once | INTRAVENOUS | Status: AC

## 2016-10-11 MED ORDER — HEPARIN SOD (PORK) LOCK FLUSH 100 UNIT/ML IV SOLN
500.0000 [IU] | Freq: Once | INTRAVENOUS | Status: AC | PRN
  Administered 2016-10-11: 500 [IU]
  Filled 2016-10-11: qty 5

## 2016-10-11 MED ORDER — SODIUM CHLORIDE 0.9% FLUSH
10.0000 mL | INTRAVENOUS | Status: DC | PRN
  Administered 2016-10-11: 10 mL
  Filled 2016-10-11: qty 10

## 2016-10-11 MED ORDER — SODIUM CHLORIDE 0.9 % IV SOLN
Freq: Once | INTRAVENOUS | Status: AC
  Administered 2016-10-11: 12:00:00 via INTRAVENOUS

## 2016-10-11 MED ORDER — DEXTROSE 5 % IV SOLN
45.0000 mg/m2 | Freq: Once | INTRAVENOUS | Status: AC
  Administered 2016-10-11: 82 mg via INTRAVENOUS
  Filled 2016-10-11: qty 11

## 2016-10-11 NOTE — Patient Instructions (Signed)
Carfilzomib injection What is this medicine? CARFILZOMIB (kar FILZ oh mib) targets a specific protein within cancer cells and stops the cancer cells from growing. It is used to treat multiple myeloma. This medicine may be used for other purposes; ask your health care provider or pharmacist if you have questions. COMMON BRAND NAME(S): KYPROLIS What should I tell my health care provider before I take this medicine? They need to know if you have any of these conditions: -heart disease -history of blood clots -irregular heartbeat -kidney disease -liver disease -lung or breathing disease -an unusual or allergic reaction to carfilzomib, or other medicines, foods, dyes, or preservatives -pregnant or trying to get pregnant -breast-feeding How should I use this medicine? This medicine is for injection or infusion into a vein. It is given by a health care professional in a hospital or clinic setting. Talk to your pediatrician regarding the use of this medicine in children. Special care may be needed. Overdosage: If you think you have taken too much of this medicine contact a poison control center or emergency room at once. NOTE: This medicine is only for you. Do not share this medicine with others. What if I miss a dose? It is important not to miss your dose. Call your doctor or health care professional if you are unable to keep an appointment. What may interact with this medicine? Interactions are not expected. Give your health care provider a list of all the medicines, herbs, non-prescription drugs, or dietary supplements you use. Also tell them if you smoke, drink alcohol, or use illegal drugs. Some items may interact with your medicine. This list may not describe all possible interactions. Give your health care provider a list of all the medicines, herbs, non-prescription drugs, or dietary supplements you use. Also tell them if you smoke, drink alcohol, or use illegal drugs. Some items may  interact with your medicine. What should I watch for while using this medicine? Your condition will be monitored carefully while you are receiving this medicine. Report any side effects. Continue your course of treatment even though you feel ill unless your doctor tells you to stop. You may need blood work done while you are taking this medicine. Do not become pregnant while taking this medicine or for at least 30 days after stopping it. Women should inform their doctor if they wish to become pregnant or think they might be pregnant. There is a potential for serious side effects to an unborn child. Men should not father a child while taking this medicine and for 90 days after stopping it. Talk to your health care professional or pharmacist for more information. Do not breast-feed an infant while taking this medicine. Check with your doctor or health care professional if you get an attack of severe diarrhea, nausea and vomiting, or if you sweat a lot. The loss of too much body fluid can make it dangerous for you to take this medicine. You may get dizzy. Do not drive, use machinery, or do anything that needs mental alertness until you know how this medicine affects you. Do not stand or sit up quickly, especially if you are an older patient. This reduces the risk of dizzy or fainting spells. What side effects may I notice from receiving this medicine? Side effects that you should report to your doctor or health care professional as soon as possible: -allergic reactions like skin rash, itching or hives, swelling of the face, lips, or tongue -confusion -dizziness -feeling faint or lightheaded -fever or chills -  palpitations -seizures -signs and symptoms of bleeding such as bloody or black, tarry stools; red or dark-brown urine; spitting up blood or brown material that looks like coffee grounds; red spots on the skin; unusual bruising or bleeding including from the eye, gums, or nose -signs and symptoms of  a blood clot such as breathing problems; changes in vision; chest pain; severe, sudden headache; pain, swelling, warmth in the leg; trouble speaking; sudden numbness or weakness of the face, arm or leg -signs and symptoms of kidney injury like trouble passing urine or change in the amount of urine -signs and symptoms of liver injury like dark yellow or brown urine; general ill feeling or flu-like symptoms; light-colored stools; loss of appetite; nausea; right upper belly pain; unusually weak or tired; yellowing of the eyes or skin Side effects that usually do not require medical attention (report to your doctor or health care professional if they continue or are bothersome): -back pain -cough -diarrhea -headache -muscle cramps -vomiting This list may not describe all possible side effects. Call your doctor for medical advice about side effects. You may report side effects to FDA at 1-800-FDA-1088. Where should I keep my medicine? This drug is given in a hospital or clinic and will not be stored at home. NOTE: This sheet is a summary. It may not cover all possible information. If you have questions about this medicine, talk to your doctor, pharmacist, or health care provider.  2018 Elsevier/Gold Standard (2015-05-29 13:39:23)  

## 2016-10-11 NOTE — Progress Notes (Signed)
Hematology and Oncology Follow Up Visit  Barbara Cook [id number] 09-21-1955 61 y.o. 10/11/2016   Principle Diagnosis:  IgG kappa myeloma  Current Therapy:   Kyprolis q week (3/1) - dose increased on 5/30   Interim History:  Ms. Barbara Cook is here today with her husband for follow-up. We had to increase her dose of Kyprolis. I also want to start her on Pomalidomide. She would prefer not to start on Pomalidomide right now.  Her myeloma studies have been creeping up. We made the change, her M spike was 1.5 g/dL. Her IgG level was 2337 milligrams per deciliter. Her Kappa Light chain was 4.0 mg/dL.  I talked to she and her husband for about half hour about the change. I told her that I do not want to see her myeloma start increasing quickly and then she would be back at "square 1".  We gave her the high dose of Kyprolis last week. She said that she had a headache with it. She did not feel all that well. She had a mouth sore. I told her that the first treatment with the higher dose might cause some issues. I would think that the other doses that we give will not be as bad.  Otherwise, she is doing okay. She's had no bleeding. She's had no leg swelling. She's had no rashes. She's had no diarrhea.  She is quite somber about having to make a change with her treatment. She is really taken this in a negative way. I told her that we are just being proactive so that she does not run into problems with myeloma.  She saws a decent performance status. Her performance status is ECOG 1.   Medications:  Allergies as of 10/11/2016      Reactions   Codeine Palpitations      Medication List       Accurate as of 10/11/16  1:53 PM. Always use your most recent med list.          acetaminophen 500 MG tablet Commonly known as:  TYLENOL Take 500 mg by mouth every 6 (six) hours as needed for mild pain or headache.   acyclovir 400 MG tablet Commonly known as:  ZOVIRAX Take 1 tablet (400 mg total) by  mouth daily.   ALIGN 4 MG Caps Take 1 capsule by mouth daily.   doxycycline 100 MG tablet Commonly known as:  VIBRA-TABS Take 1 tablet (100 mg total) by mouth 2 (two) times daily.   fluconazole 200 MG tablet Commonly known as:  DIFLUCAN Take 1 daily if needed for yeast infection   hyoscyamine 0.125 MG SL tablet Commonly known as:  LEVSIN SL Place 1 tablet (0.125 mg total) under the tongue every 4 (four) hours as needed.   KLOR-CON 8 MEQ tablet Generic drug:  potassium chloride 1 TABLET ONCE A DAY ORALLY 30 DAY(S)   lidocaine-prilocaine cream Commonly known as:  EMLA Apply to affected area once   LORazepam 0.5 MG tablet Commonly known as:  ATIVAN Take 1 tablet (0.5 mg total) by mouth every 6 (six) hours as needed (Nausea or vomiting).   magnesium oxide 400 (241.3 Mg) MG tablet Commonly known as:  MAG-OX TAKE 1 TABLET BY MOUTH TWICE A DAY   multivitamin tablet Take 1 tablet by mouth every evening.   mupirocin cream 2 % Commonly known as:  BACTROBAN Apply 1 application topically 2 (two) times daily.   ondansetron 8 MG tablet Commonly known as:  ZOFRAN Take 1 tablet (8  mg total) by mouth 2 (two) times daily. For nausea & vomiting. Take 1 tablet 1 hour prior to chemotherapy.   ondansetron 8 MG tablet Commonly known as:  ZOFRAN TAKE 1 TABLET BY MOUTH TWICE A DAY AS NEEDED FOR NAUSEA AND VOMITING   pantoprazole 40 MG tablet Commonly known as:  PROTONIX Take 1 tablet (40 mg total) by mouth daily.   polyethylene glycol powder powder Commonly known as:  GLYCOLAX/MIRALAX 1 capful daily as needed   pomalidomide 3 MG capsule Commonly known as:  POMALYST Take 1 capsule (3 mg total) by mouth daily. Take with water on days 1-21. Repeat every 28 days.   prochlorperazine 10 MG tablet Commonly known as:  COMPAZINE Take 1 tablet (10 mg total) by mouth every 6 (six) hours as needed (Nausea or vomiting).   spironolactone 50 MG tablet Commonly known as:  ALDACTONE Take 50  mg by mouth daily.   torsemide 20 MG tablet Commonly known as:  DEMADEX Take 1 tablet (20 mg total) by mouth daily as needed.   vitamin B-6 250 MG tablet Take 1 tablet (250 mg total) by mouth daily.   Vitamin D (Ergocalciferol) 50000 units Caps capsule Commonly known as:  DRISDOL TAKE 1 CAPSULE (50,000 UNITS TOTAL) BY MOUTH ONCE A WEEK.       Allergies:  Allergies  Allergen Reactions  . Codeine Palpitations    Past Medical History, Surgical history, Social history, and Family History were reviewed and updated.  Review of Systems: All other 10 point review of systems is negative.   Physical Exam:  weight is 168 lb (76.2 kg). Her oral temperature is 98.1 F (36.7 C). Her blood pressure is 116/46 (abnormal) and her pulse is 74. Her respiration is 16 and oxygen saturation is 99%.   Wt Readings from Last 3 Encounters:  10/11/16 168 lb (76.2 kg)  09/27/16 170 lb 1.9 oz (77.2 kg)  08/16/16 170 lb (77.1 kg)    Well-developed and well-nourished [place] American female. Head and neck exam shows no ocular or oral lesions. There are no palpable cervical or supraclavicular lymph nodes.There is some tenderness to palpation just to the left of the midline at the base of her neck. I cannot palpate any lymph nodes. I cannot palpate her thyroid. She has a good carotid pulse. Lungs are clear. Cardiac exam regular rate and rhythm with no murmurs, rubs or bruits. Abdomen is soft. She is mildly obese. She really has no abdominal distention. She has no palpable liver or spleen tip. Back exam shows no tenderness over the spine, ribs or hips. Extremity shows no clubbing, cyanosis or edema. Skin exam shows no rashes, ecchymoses or petechia. Neurological exam is nonfocal.   Lab Results  Component Value Date   WBC 2.8 (L) 10/11/2016   HGB 10.0 (L) 10/11/2016   HCT 29.8 (L) 10/11/2016   MCV 92 10/11/2016   PLT 110 (L) 10/11/2016   Lab Results  Component Value Date   FERRITIN 102 02/20/2016    IRON 81 02/20/2016   TIBC 316 02/20/2016   UIBC 235 02/20/2016   IRONPCTSAT 26 02/20/2016   Lab Results  Component Value Date   RETICCTPCT 1.1 12/25/2014   RBC 3.25 (L) 10/11/2016   RETICCTABS 35.9 12/25/2014   Lab Results  Component Value Date   KPAFRELGTCHN 7.11 (H) 05/07/2015   LAMBDASER 1.65 05/07/2015   KAPLAMBRATIO 5.01 (H) 09/27/2016   Lab Results  Component Value Date   IGGSERUM 2,337 (H) 09/27/2016   IGA 116  05/07/2015   IGMSERUM 20 (L) 09/27/2016   Lab Results  Component Value Date   TOTALPROTELP 7.8 05/07/2015   ALBUMINELP 4.2 05/07/2015   A1GS 0.3 05/07/2015   A2GS 0.8 05/07/2015   BETS 0.4 05/07/2015   BETA2SER 0.3 05/07/2015   GAMS 1.9 (H) 05/07/2015   MSPIKE 1.0 (H) 07/26/2016   SPEI * 05/07/2015     Chemistry      Component Value Date/Time   NA 141 10/11/2016 1020   NA 139 02/20/2016 1207   K 3.7 10/11/2016 1020   K 3.8 02/20/2016 1207   CL 104 10/11/2016 1020   CO2 29 10/11/2016 1020   CO2 29 02/20/2016 1207   BUN 17 10/11/2016 1020   BUN 22.3 02/20/2016 1207   CREATININE 1.0 10/11/2016 1020   CREATININE 1.3 (H) 02/20/2016 1207      Component Value Date/Time   CALCIUM 8.9 10/11/2016 1020   CALCIUM 9.7 02/20/2016 1207   ALKPHOS 43 10/11/2016 1020   ALKPHOS 62 02/20/2016 1207   AST 29 10/11/2016 1020   AST 27 02/20/2016 1207   ALT 30 10/11/2016 1020   ALT 19 02/20/2016 1207   BILITOT 0.80 10/11/2016 1020   BILITOT 0.44 02/20/2016 1207      Impression and Plan: Ms. Sicard is a very pleasant 61 yo African American female with IgG myeloma.   Hopefully, just increasing dose of Kyprolis will help her myeloma.  It would not surprise me if we have to add Pomalidomide. I would not use full dose Pomalidomide. I would use it 3 mg daily dose.  I spent about 40 minutes with she and her husband. Again, she is really taking this hard. I told her that she should not be as worried. I told her that her therapy and having myeloma, or any cancer, is  an emotional roller coaster.  We will be checking her myeloma studies monthly.  I will plan to get her back to see [place] in another 2 or 3 weeks.   [name], MD 6/4/20181:53 PM

## 2016-10-11 NOTE — Patient Instructions (Signed)
Implanted Port Home Guide An implanted port is a type of central line that is placed under the skin. Central lines are used to provide IV access when treatment or nutrition needs to be given through a person's veins. Implanted ports are used for long-term IV access. An implanted port may be placed because:  You need IV medicine that would be irritating to the small veins in your hands or arms.  You need long-term IV medicines, such as antibiotics.  You need IV nutrition for a long period.  You need frequent blood draws for lab tests.  You need dialysis.  Implanted ports are usually placed in the chest area, but they can also be placed in the upper arm, the abdomen, or the leg. An implanted port has two main parts:  Reservoir. The reservoir is round and will appear as a small, raised area under your skin. The reservoir is the part where a needle is inserted to give medicines or draw blood.  Catheter. The catheter is a thin, flexible tube that extends from the reservoir. The catheter is placed into a large vein. Medicine that is inserted into the reservoir goes into the catheter and then into the vein.  How will I care for my incision site? Do not get the incision site wet. Bathe or shower as directed by your health care provider. How is my port accessed? Special steps must be taken to access the port:  Before the port is accessed, a numbing cream can be placed on the skin. This helps numb the skin over the port site.  Your health care provider uses a sterile technique to access the port. ? Your health care provider must put on a mask and sterile gloves. ? The skin over your port is cleaned carefully with an antiseptic and allowed to dry. ? The port is gently pinched between sterile gloves, and a needle is inserted into the port.  Only "non-coring" port needles should be used to access the port. Once the port is accessed, a blood return should be checked. This helps ensure that the port  is in the vein and is not clogged.  If your port needs to remain accessed for a constant infusion, a clear (transparent) bandage will be placed over the needle site. The bandage and needle will need to be changed every week, or as directed by your health care provider.  Keep the bandage covering the needle clean and dry. Do not get it wet. Follow your health care provider's instructions on how to take a shower or bath while the port is accessed.  If your port does not need to stay accessed, no bandage is needed over the port.  What is flushing? Flushing helps keep the port from getting clogged. Follow your health care provider's instructions on how and when to flush the port. Ports are usually flushed with saline solution or a medicine called heparin. The need for flushing will depend on how the port is used.  If the port is used for intermittent medicines or blood draws, the port will need to be flushed: ? After medicines have been given. ? After blood has been drawn. ? As part of routine maintenance.  If a constant infusion is running, the port may not need to be flushed.  How long will my port stay implanted? The port can stay in for as long as your health care provider thinks it is needed. When it is time for the port to come out, surgery will be   done to remove it. The procedure is similar to the one performed when the port was put in. When should I seek immediate medical care? When you have an implanted port, you should seek immediate medical care if:  You notice a bad smell coming from the incision site.  You have swelling, redness, or drainage at the incision site.  You have more swelling or pain at the port site or the surrounding area.  You have a fever that is not controlled with medicine.  This information is not intended to replace advice given to you by your health care provider. Make sure you discuss any questions you have with your health care provider. Document  Released: 04/26/2005 Document Revised: 10/02/2015 Document Reviewed: 01/01/2013 Elsevier Interactive Patient Education  2017 [employer]  

## 2016-10-12 ENCOUNTER — Ambulatory Visit (INDEPENDENT_AMBULATORY_CARE_PROVIDER_SITE_OTHER): Payer: Self-pay | Admitting: Orthopedic Surgery

## 2016-10-15 ENCOUNTER — Ambulatory Visit (INDEPENDENT_AMBULATORY_CARE_PROVIDER_SITE_OTHER): Payer: 59

## 2016-10-15 ENCOUNTER — Encounter (INDEPENDENT_AMBULATORY_CARE_PROVIDER_SITE_OTHER): Payer: Self-pay | Admitting: Orthopedic Surgery

## 2016-10-15 ENCOUNTER — Ambulatory Visit (INDEPENDENT_AMBULATORY_CARE_PROVIDER_SITE_OTHER): Payer: 59 | Admitting: Orthopedic Surgery

## 2016-10-15 VITALS — Ht 63.78 in | Wt 168.0 lb

## 2016-10-15 DIAGNOSIS — M25572 Pain in left ankle and joints of left foot: Secondary | ICD-10-CM | POA: Diagnosis not present

## 2016-10-15 NOTE — Progress Notes (Signed)
Office Visit Note   Patient: Barbara Cook           Date of Birth: Sep 16, 1955           MRN: [id number] Visit Date: 10/15/2016              Requested by: [name], MD [address] [place], [place] [address] PCP: [name], MD  Chief Complaint  Patient presents with  . Left Ankle - Pain      HPI: Patient states she noticed some increased swelling over the lateral aspect of the left ankle. Patient was concerned this may be a cyst. Past medical history is updated patient has no history of gout but she states her father was just diagnosed with gout.  Assessment & Plan: Visit Diagnoses:  1. Pain in left ankle and joints of left foot     Plan: We will draw uric acid level today. Patient did not want to pursue a intra-articular steroid injection today we will call her back with the uric acid results.  Follow-Up Instructions: Return if symptoms worsen or fail to improve.   Ortho Exam  Patient is alert, oriented, no adenopathy, well-dressed, normal affect, normal respiratory effort. Examination she has a normal gait. Patient has no pain with weightbearing or activities of daily living. She does have a good dorsalis pedis pulse the peroneal posterior tibial tendons are nontender to palpation. The tibia and fibula nontender to palpation she does have swelling anteriorly of the ankle and is tender to palpation over the medial and lateral gutters as well as the anterior joint line of the left ankle. There is no redness no cellulitis. 2 years ago her uric acid was 5.1.  Imaging: Xr Ankle Complete Left  Result Date: 10/15/2016 Three-view radiographs of the left ankle shows a congruent mortise no joint space narrowing is a little osteophytic bone spur medially   Labs: Lab Results  Component Value Date   LABURIC 5.1 02/18/2015   REPTSTATUS 06/04/2014 FINAL 06/03/2014   CULT  06/03/2014    Multiple bacterial morphotypes present, none predominant. Suggest  appropriate recollection if clinically indicated. Performed at [facility]     Orders:  Orders Placed This Encounter  Procedures  . XR Ankle Complete Left   No orders of the defined types were placed in this encounter.    Procedures: No procedures performed  Clinical Data: No additional findings.  ROS:  All other systems negative, except as noted in the HPI. Review of Systems  Objective: Vital Signs: Ht 5' 3.78" (1.62 m)   Wt 168 lb (76.2 kg)   BMI 29.04 kg/m   Specialty Comments:  No specialty comments available.  PMFS History: Patient Active Problem List   Diagnosis Date Noted  . Lytic bone lesions on xray 03/05/2016  . Deviated septum 12/31/2015  . Epistaxis, recurrent 12/31/2015  . Otorrhagia of left ear 12/31/2015  . Seasonal allergic rhinitis 12/31/2015  . Multiple myeloma ([place]) 02/18/2014  . Abnormal CXR 01/11/2014  . Abnormality of gait 02/16/2013  . Polymyalgia rheumatica ([place])   . GERD (gastroesophageal reflux disease)   . Arthritis   . Irritable bowel syndrome 10/18/2011  . Altered bowel function 08/30/2011  . Abdominal pain, left upper quadrant 11/10/2010  . ARTHRITIS 04/09/2008   Past Medical History:  Diagnosis Date  . Anemia   . Arthritis   . Arthropathy, unspecified, site unspecified   . Complication of anesthesia    02/13/14- had biospy in X-Ray- "twilight"  "  I felt every thing"  . Constipation   . Endometriosis   . Family history of adverse reaction to anesthesia    Sister- patient will find out  . Fibroid   . GERD (gastroesophageal reflux disease)   . Heart murmur   . IBS (irritable bowel syndrome)   . 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 Multiple myeloma ([place]) 02/18/2014  . Myelitis ([place]) 2011   of bone- neck  . Neuromuscular disorder ([place])   . Polymyalgia rheumatica ([place])   . PONV (postoperative nausea and vomiting)    1990    Family History  Problem Relation Age of Onset  . Heart  disease Mother   . Rheum arthritis Mother   . Lung cancer Maternal Uncle   . Throat cancer Maternal Aunt   . Diabetes Maternal Grandmother   . Hypertension Maternal Grandmother   . Heart disease Maternal Grandmother   . Heart disease Paternal Grandmother   . Colon cancer Neg Hx   . Esophageal cancer Neg Hx   . Stomach cancer Neg Hx   . Rectal cancer Neg Hx     Past Surgical History:  Procedure Laterality Date  . COLONOSCOPY    . Fistula repair surg    . IR GENERIC HISTORICAL  04/14/2016   IR FLUORO GUIDE PORT INSERTION RIGHT 04/14/2016 Greggory Keen, MD [facility]  . IR GENERIC HISTORICAL  04/14/2016   IR US GUIDE VASC ACCESS RIGHT 04/14/2016 Greggory Keen, MD [facility]  . KNEE ARTHROSCOPY Left   . NECK SURGERY  2011   Myletis- bone grafting- from her left hip  . RADIOLOGY WITH ANESTHESIA N/A 08/23/2014   Procedure: T12  ABLATION       (RADIOLOGY WITH ANESTHESIA);  Surgeon: Luanne Bras, MD;  Location: [facility];  Service: Radiology;  Laterality: N/A;  . TUBAL LIGATION    . UMBILICAL HERNIA REPAIR     age 45   Social History   Occupational History  . [employer]   Social History Main Topics  . Smoking status: Former Smoker    Packs/day: 25.00    Years: 5.00    Types: Cigarettes    Start date: 05/26/1988    Quit date: 01/11/1994  . Smokeless tobacco: Never Used     Comment: QUIT SMOKING 20 YEARS AGO  . Alcohol use No  . Drug use: No  . Sexual activity: Not on file     Comment: BTL

## 2016-10-15 NOTE — Addendum Note (Signed)
Addended by: Mariana Arn on: 10/15/2016 08:37 AM   Modules accepted: Orders

## 2016-10-16 LAB — URIC ACID: URIC ACID, SERUM: 5.3 mg/dL (ref 2.5–7.0)

## 2016-10-18 ENCOUNTER — Ambulatory Visit: Payer: 59

## 2016-10-18 ENCOUNTER — Other Ambulatory Visit: Payer: 59

## 2016-10-18 ENCOUNTER — Ambulatory Visit: Payer: 59 | Admitting: Hematology & Oncology

## 2016-10-25 ENCOUNTER — Other Ambulatory Visit (HOSPITAL_BASED_OUTPATIENT_CLINIC_OR_DEPARTMENT_OTHER): Payer: 59

## 2016-10-25 ENCOUNTER — Ambulatory Visit (HOSPITAL_BASED_OUTPATIENT_CLINIC_OR_DEPARTMENT_OTHER): Payer: 59

## 2016-10-25 ENCOUNTER — Other Ambulatory Visit: Payer: 59

## 2016-10-25 ENCOUNTER — Ambulatory Visit: Payer: 59

## 2016-10-25 DIAGNOSIS — C9002 Multiple myeloma in relapse: Secondary | ICD-10-CM

## 2016-10-25 DIAGNOSIS — Z5112 Encounter for antineoplastic immunotherapy: Secondary | ICD-10-CM | POA: Diagnosis not present

## 2016-10-25 DIAGNOSIS — C9000 Multiple myeloma not having achieved remission: Secondary | ICD-10-CM | POA: Diagnosis not present

## 2016-10-25 LAB — CBC WITH DIFFERENTIAL (CANCER CENTER ONLY)
BASO#: 0 10*3/uL (ref 0.0–0.2)
BASO%: 0.3 % (ref 0.0–2.0)
EOS%: 2.4 % (ref 0.0–7.0)
Eosinophils Absolute: 0.1 10*3/uL (ref 0.0–0.5)
HEMATOCRIT: 30.4 % — AB (ref 34.8–46.6)
HGB: 10.2 g/dL — ABNORMAL LOW (ref 11.6–15.9)
LYMPH#: 1 10*3/uL (ref 0.9–3.3)
LYMPH%: 32.8 % (ref 14.0–48.0)
MCH: 31.2 pg (ref 26.0–34.0)
MCHC: 33.6 g/dL (ref 32.0–36.0)
MCV: 93 fL (ref 81–101)
MONO#: 0.4 10*3/uL (ref 0.1–0.9)
MONO%: 14.7 % — ABNORMAL HIGH (ref 0.0–13.0)
NEUT%: 49.8 % (ref 39.6–80.0)
NEUTROS ABS: 1.5 10*3/uL (ref 1.5–6.5)
Platelets: 182 10*3/uL (ref 145–400)
RBC: 3.27 10*6/uL — AB (ref 3.70–5.32)
RDW: 13.9 % (ref 11.1–15.7)
WBC: 2.9 10*3/uL — AB (ref 3.9–10.0)

## 2016-10-25 LAB — CMP (CANCER CENTER ONLY)
ALBUMIN: 3.5 g/dL (ref 3.3–5.5)
ALK PHOS: 43 U/L (ref 26–84)
ALT: 25 U/L (ref 10–47)
AST: 26 U/L (ref 11–38)
BILIRUBIN TOTAL: 0.7 mg/dL (ref 0.20–1.60)
BUN, Bld: 26 mg/dL — ABNORMAL HIGH (ref 7–22)
CALCIUM: 9.5 mg/dL (ref 8.0–10.3)
CO2: 27 mEq/L (ref 18–33)
CREATININE: 0.9 mg/dL (ref 0.6–1.2)
Chloride: 104 mEq/L (ref 98–108)
Glucose, Bld: 94 mg/dL (ref 73–118)
Potassium: 3.9 mEq/L (ref 3.3–4.7)
Sodium: 140 mEq/L (ref 128–145)
TOTAL PROTEIN: 7.6 g/dL (ref 6.4–8.1)

## 2016-10-25 MED ORDER — SODIUM CHLORIDE 0.9% FLUSH
10.0000 mL | INTRAVENOUS | Status: DC | PRN
  Administered 2016-10-25: 10 mL
  Filled 2016-10-25: qty 10

## 2016-10-25 MED ORDER — HEPARIN SOD (PORK) LOCK FLUSH 100 UNIT/ML IV SOLN
500.0000 [IU] | Freq: Once | INTRAVENOUS | Status: AC | PRN
  Administered 2016-10-25: 500 [IU]
  Filled 2016-10-25: qty 5

## 2016-10-25 MED ORDER — DEXTROSE 5 % IV SOLN
45.0000 mg/m2 | Freq: Once | INTRAVENOUS | Status: AC
  Administered 2016-10-25: 82 mg via INTRAVENOUS
  Filled 2016-10-25: qty 12

## 2016-10-25 MED ORDER — SODIUM CHLORIDE 0.9 % IV SOLN
Freq: Once | INTRAVENOUS | Status: AC
  Administered 2016-10-25: 11:00:00 via INTRAVENOUS

## 2016-10-25 NOTE — Addendum Note (Signed)
Addended by: Burney Gauze R on: 10/25/2016 12:03 PM   Modules accepted: Orders

## 2016-10-25 NOTE — Patient Instructions (Signed)
[facility] Discharge Instructions for Patients Receiving Chemotherapy  Today you received the following chemotherapy agents: Kyprolis   To help prevent nausea and vomiting after your treatment, we encourage you to take your nausea medication as directed.    If you develop nausea and vomiting that is not controlled by your nausea medication, call the clinic.   BELOW ARE SYMPTOMS THAT SHOULD BE REPORTED IMMEDIATELY:  *FEVER GREATER THAN 100.5 F  *CHILLS WITH OR WITHOUT FEVER  NAUSEA AND VOMITING THAT IS NOT CONTROLLED WITH YOUR NAUSEA MEDICATION  *UNUSUAL SHORTNESS OF BREATH  *UNUSUAL BRUISING OR BLEEDING  TENDERNESS IN MOUTH AND THROAT WITH OR WITHOUT PRESENCE OF ULCERS  *URINARY PROBLEMS  *BOWEL PROBLEMS  UNUSUAL RASH Items with * indicate a potential emergency and should be followed up as soon as possible.  Feel free to call the clinic you have any questions or concerns. The clinic phone number is [phone].  Please show the CHEMO ALERT CARD at check-in to the Emergency Department and triage nurse.   

## 2016-10-26 LAB — KAPPA/LAMBDA LIGHT CHAINS
IG KAPPA FREE LIGHT CHAIN: 20.6 mg/L — AB (ref 3.3–19.4)
Ig Lambda Free Light Chain: 8 mg/L (ref 5.7–26.3)
Kappa/Lambda FluidC Ratio: 2.58 — ABNORMAL HIGH (ref 0.26–1.65)

## 2016-10-26 LAB — IGG, IGA, IGM
IGA/IMMUNOGLOBULIN A, SERUM: 68 mg/dL — AB (ref 87–352)
IGM (IMMUNOGLOBIN M), SRM: 19 mg/dL — AB (ref 26–217)

## 2016-10-27 LAB — PROTEIN ELECTROPHORESIS, SERUM, WITH REFLEX
A/G Ratio: 1 (ref 0.7–1.7)
ALPHA 1: 0.2 g/dL (ref 0.0–0.4)
Albumin: 3.7 g/dL (ref 2.9–4.4)
Alpha 2: 0.6 g/dL (ref 0.4–1.0)
Beta: 1 g/dL (ref 0.7–1.3)
Gamma Globulin: 2 g/dL — ABNORMAL HIGH (ref 0.4–1.8)
Globulin, Total: 3.7 g/dL (ref 2.2–3.9)
INTERPRETATION(SEE BELOW): 0
M-SPIKE, %: 1.4 g/dL — AB
Total Protein: 7.4 g/dL (ref 6.0–8.5)

## 2016-10-29 ENCOUNTER — Other Ambulatory Visit: Payer: Self-pay | Admitting: *Deleted

## 2016-10-29 DIAGNOSIS — C9000 Multiple myeloma not having achieved remission: Secondary | ICD-10-CM

## 2016-11-01 ENCOUNTER — Other Ambulatory Visit (HOSPITAL_BASED_OUTPATIENT_CLINIC_OR_DEPARTMENT_OTHER): Payer: 59

## 2016-11-01 ENCOUNTER — Other Ambulatory Visit: Payer: 59

## 2016-11-01 ENCOUNTER — Ambulatory Visit: Payer: 59

## 2016-11-01 ENCOUNTER — Telehealth: Payer: Self-pay | Admitting: Internal Medicine

## 2016-11-01 ENCOUNTER — Ambulatory Visit: Payer: 59 | Admitting: Hematology & Oncology

## 2016-11-01 DIAGNOSIS — C9000 Multiple myeloma not having achieved remission: Secondary | ICD-10-CM

## 2016-11-01 LAB — CBC WITH DIFFERENTIAL (CANCER CENTER ONLY)
BASO#: 0 10e3/uL (ref 0.0–0.2)
BASO%: 0.4 % (ref 0.0–2.0)
EOS%: 3.7 % (ref 0.0–7.0)
Eosinophils Absolute: 0.1 10e3/uL (ref 0.0–0.5)
HCT: 29.6 % — ABNORMAL LOW (ref 34.8–46.6)
HGB: 9.8 g/dL — ABNORMAL LOW (ref 11.6–15.9)
LYMPH#: 1 10e3/uL (ref 0.9–3.3)
LYMPH%: 37 % (ref 14.0–48.0)
MCH: 30.6 pg (ref 26.0–34.0)
MCHC: 33.1 g/dL (ref 32.0–36.0)
MCV: 93 fL (ref 81–101)
MONO#: 0.6 10e3/uL (ref 0.1–0.9)
MONO%: 20.9 % — ABNORMAL HIGH (ref 0.0–13.0)
NEUT#: 1 10e3/uL — ABNORMAL LOW (ref 1.5–6.5)
NEUT%: 38 % — ABNORMAL LOW (ref 39.6–80.0)
Platelets: 95 10e3/uL — ABNORMAL LOW (ref 145–400)
RBC: 3.2 10e6/uL — ABNORMAL LOW (ref 3.70–5.32)
RDW: 13.5 % (ref 11.1–15.7)
WBC: 2.7 10e3/uL — ABNORMAL LOW (ref 3.9–10.0)

## 2016-11-01 LAB — CMP (CANCER CENTER ONLY)
ALBUMIN: 3.3 g/dL (ref 3.3–5.5)
ALK PHOS: 43 U/L (ref 26–84)
ALT: 25 U/L (ref 10–47)
AST: 27 U/L (ref 11–38)
BILIRUBIN TOTAL: 0.8 mg/dL (ref 0.20–1.60)
BUN: 18 mg/dL (ref 7–22)
CO2: 26 mEq/L (ref 18–33)
CREATININE: 0.9 mg/dL (ref 0.6–1.2)
Calcium: 9.5 mg/dL (ref 8.0–10.3)
Chloride: 106 mEq/L (ref 98–108)
Glucose, Bld: 76 mg/dL (ref 73–118)
Potassium: 4 mEq/L (ref 3.3–4.7)
SODIUM: 142 meq/L (ref 128–145)
Total Protein: 7.3 g/dL (ref 6.4–8.1)

## 2016-11-01 NOTE — Patient Instructions (Signed)
Implanted Port Home Guide An implanted port is a type of central line that is placed under the skin. Central lines are used to provide IV access when treatment or nutrition needs to be given through a person's veins. Implanted ports are used for long-term IV access. An implanted port may be placed because:  You need IV medicine that would be irritating to the small veins in your hands or arms.  You need long-term IV medicines, such as antibiotics.  You need IV nutrition for a long period.  You need frequent blood draws for lab tests.  You need dialysis.  Implanted ports are usually placed in the chest area, but they can also be placed in the upper arm, the abdomen, or the leg. An implanted port has two main parts:  Reservoir. The reservoir is round and will appear as a small, raised area under your skin. The reservoir is the part where a needle is inserted to give medicines or draw blood.  Catheter. The catheter is a thin, flexible tube that extends from the reservoir. The catheter is placed into a large vein. Medicine that is inserted into the reservoir goes into the catheter and then into the vein.  How will I care for my incision site? Do not get the incision site wet. Bathe or shower as directed by your health care provider. How is my port accessed? Special steps must be taken to access the port:  Before the port is accessed, a numbing cream can be placed on the skin. This helps numb the skin over the port site.  Your health care provider uses a sterile technique to access the port. ? Your health care provider must put on a mask and sterile gloves. ? The skin over your port is cleaned carefully with an antiseptic and allowed to dry. ? The port is gently pinched between sterile gloves, and a needle is inserted into the port.  Only "non-coring" port needles should be used to access the port. Once the port is accessed, a blood return should be checked. This helps ensure that the port  is in the vein and is not clogged.  If your port needs to remain accessed for a constant infusion, a clear (transparent) bandage will be placed over the needle site. The bandage and needle will need to be changed every week, or as directed by your health care provider.  Keep the bandage covering the needle clean and dry. Do not get it wet. Follow your health care provider's instructions on how to take a shower or bath while the port is accessed.  If your port does not need to stay accessed, no bandage is needed over the port.  What is flushing? Flushing helps keep the port from getting clogged. Follow your health care provider's instructions on how and when to flush the port. Ports are usually flushed with saline solution or a medicine called heparin. The need for flushing will depend on how the port is used.  If the port is used for intermittent medicines or blood draws, the port will need to be flushed: ? After medicines have been given. ? After blood has been drawn. ? As part of routine maintenance.  If a constant infusion is running, the port may not need to be flushed.  How long will my port stay implanted? The port can stay in for as long as your health care provider thinks it is needed. When it is time for the port to come out, surgery will be   done to remove it. The procedure is similar to the one performed when the port was put in. When should I seek immediate medical care? When you have an implanted port, you should seek immediate medical care if:  You notice a bad smell coming from the incision site.  You have swelling, redness, or drainage at the incision site.  You have more swelling or pain at the port site or the surrounding area.  You have a fever that is not controlled with medicine.  This information is not intended to replace advice given to you by your health care provider. Make sure you discuss any questions you have with your health care provider. Document  Released: 04/26/2005 Document Revised: 10/02/2015 Document Reviewed: 01/01/2013 Elsevier Interactive Patient Education  2017 [employer]  

## 2016-11-01 NOTE — Progress Notes (Signed)
Hematology and Oncology Follow Up Visit  Barbara Cook [id number] 06/15/1955 61 y.o. 11/01/2016   Principle Diagnosis:  IgG kappa myeloma  Current Therapy:   Kyprolis q week (3/1) - d/c'ed on 11/01/2016 for toxicity   Interim History:  Barbara Cook is here today with her husband for an unscheduled visit. Unfortunately, it just does not look like she can tolerate the increased dose of Kyprolis. The sad part is that it is working. Her IgG level went down to 1950mg /dL  Her Kappa Lightchain went down to 2 mg/dL.   She's had last found that she had, she was sick for several days. She says she cannot get out of bed. She had a lot of weakness. She slept quite a bit.   As such, I think we will have to stop the Kyprolis. She did not want to take Pomalidomide.   I think the next option would have to be monoclonal antibody therapy. I talked to she and her husband about this.   Hopefully, weak and get her through most of the summer without any treatment. I will like to think that her M spike will continue to improve.   She wants to see her gastroenterologist. She cannot remember the last time that she saw him.   She's had no fever. There's been no bleeding. She's had no diarrhea.   She still has a decent performance status. Her performance status is ECOG 1.   Medications:  Allergies as of 11/01/2016      Reactions   Codeine Palpitations      Medication List       Accurate as of 11/01/16 10:35 AM. Always use your most recent med list.          acetaminophen 500 MG tablet Commonly known as:  TYLENOL Take 500 mg by mouth every 6 (six) hours as needed for mild pain or headache.   ALIGN 4 MG Caps Take 1 capsule by mouth daily.   doxycycline 100 MG tablet Commonly known as:  VIBRA-TABS Take 1 tablet (100 mg total) by mouth 2 (two) times daily.   fluconazole 200 MG tablet Commonly known as:  DIFLUCAN Take 1 daily if needed for yeast infection   hyoscyamine 0.125 MG SL  tablet Commonly known as:  LEVSIN SL Place 1 tablet (0.125 mg total) under the tongue every 4 (four) hours as needed.   KLOR-CON 8 MEQ tablet Generic drug:  potassium chloride 1 TABLET ONCE A DAY ORALLY 30 DAY(S)   magnesium oxide 400 (241.3 Mg) MG tablet Commonly known as:  MAG-OX TAKE 1 TABLET BY MOUTH TWICE A DAY   multivitamin tablet Take 1 tablet by mouth every evening.   mupirocin cream 2 % Commonly known as:  BACTROBAN Apply 1 application topically 2 (two) times daily.   ondansetron 8 MG tablet Commonly known as:  ZOFRAN Take 1 tablet (8 mg total) by mouth 2 (two) times daily. For nausea & vomiting. Take 1 tablet 1 hour prior to chemotherapy.   ondansetron 8 MG tablet Commonly known as:  ZOFRAN TAKE 1 TABLET BY MOUTH TWICE A DAY AS NEEDED FOR NAUSEA AND VOMITING   pantoprazole 40 MG tablet Commonly known as:  PROTONIX Take 1 tablet (40 mg total) by mouth daily.   polyethylene glycol powder powder Commonly known as:  GLYCOLAX/MIRALAX 1 capful daily as needed   pomalidomide 3 MG capsule Commonly known as:  POMALYST Take 1 capsule (3 mg total) by mouth daily. Take with water on days 1-21.  Repeat every 28 days.   spironolactone 50 MG tablet Commonly known as:  ALDACTONE Take 50 mg by mouth daily.   torsemide 20 MG tablet Commonly known as:  DEMADEX Take 1 tablet (20 mg total) by mouth daily as needed.   vitamin B-6 250 MG tablet Take 1 tablet (250 mg total) by mouth daily.   Vitamin D (Ergocalciferol) 50000 units Caps capsule Commonly known as:  DRISDOL TAKE 1 CAPSULE (50,000 UNITS TOTAL) BY MOUTH ONCE A WEEK.       Allergies:  Allergies  Allergen Reactions  . Codeine Palpitations    Past Medical History, Surgical history, Social history, and Family History were reviewed and updated.  Review of Systems: All other 10 point review of systems is negative.   Physical Exam:  vitals were not taken for this visit.  Wt Readings from Last 3 Encounters:   10/15/16 168 lb (76.2 kg)  10/11/16 168 lb (76.2 kg)  09/27/16 170 lb 1.9 oz (77.2 kg)    Well-developed and well-nourished [place] American female. Head and neck exam shows no ocular or oral lesions. There are no palpable cervical or supraclavicular lymph nodes.There is some tenderness to palpation just to the left of the midline at the base of her neck. I cannot palpate any lymph nodes. I cannot palpate her thyroid. She has a good carotid pulse. Lungs are clear. Cardiac exam regular rate and rhythm with no murmurs, rubs or bruits. Abdomen is soft. She is mildly obese. She really has no abdominal distention. She has no palpable liver or spleen tip. Back exam shows no tenderness over the spine, ribs or hips. Extremity shows no clubbing, cyanosis or edema. Skin exam shows no rashes, ecchymoses or petechia. Neurological exam is nonfocal.   Lab Results  Component Value Date   WBC 2.7 (L) 11/01/2016   HGB 9.8 (L) 11/01/2016   HCT 29.6 (L) 11/01/2016   MCV 93 11/01/2016   PLT 95 (L) 11/01/2016   Lab Results  Component Value Date   FERRITIN 102 02/20/2016   IRON 81 02/20/2016   TIBC 316 02/20/2016   UIBC 235 02/20/2016   IRONPCTSAT 26 02/20/2016   Lab Results  Component Value Date   RETICCTPCT 1.1 12/25/2014   RBC 3.20 (L) 11/01/2016   RETICCTABS 35.9 12/25/2014   Lab Results  Component Value Date   KPAFRELGTCHN 7.11 (H) 05/07/2015   LAMBDASER 1.65 05/07/2015   KAPLAMBRATIO 2.58 (H) 10/25/2016   Lab Results  Component Value Date   IGGSERUM 1,946 (H) 10/25/2016   IGA 116 05/07/2015   IGMSERUM 19 (L) 10/25/2016   Lab Results  Component Value Date   TOTALPROTELP 7.8 05/07/2015   ALBUMINELP 4.2 05/07/2015   A1GS 0.3 05/07/2015   A2GS 0.8 05/07/2015   BETS 0.4 05/07/2015   BETA2SER 0.3 05/07/2015   GAMS 1.9 (H) 05/07/2015   MSPIKE 1.4 (H) 10/25/2016   SPEI * 05/07/2015     Chemistry      Component Value Date/Time   NA 142 11/01/2016 0909   NA 139 02/20/2016 1207   K  4.0 11/01/2016 0909   K 3.8 02/20/2016 1207   CL 106 11/01/2016 0909   CO2 26 11/01/2016 0909   CO2 29 02/20/2016 1207   BUN 18 11/01/2016 0909   BUN 22.3 02/20/2016 1207   CREATININE 0.9 11/01/2016 0909   CREATININE 1.3 (H) 02/20/2016 1207      Component Value Date/Time   CALCIUM 9.5 11/01/2016 0909   CALCIUM 9.7 02/20/2016 1207  ALKPHOS 43 11/01/2016 0909   ALKPHOS 62 02/20/2016 1207   AST 27 11/01/2016 0909   AST 27 02/20/2016 1207   ALT 25 11/01/2016 0909   ALT 19 02/20/2016 1207   BILITOT 0.80 11/01/2016 0909   BILITOT 0.44 02/20/2016 1207      Impression and Plan: Barbara Cook is a very pleasant 61 yo African American female with IgG myeloma.   For now, we will just have to give Barbara Cook a break from treatment. Hopefully, we will get her through the summer without having to restart treatment.  I will see her back in 3 weeks.  I spent about 30 minutes with she and her husband. She feels much better not having to have to take treatment. Hopefully, this will make her feel better and well for when they go on their trip down to [place] in 1 month. He is almost like urinary symptoms and inguinal to get her back to see [place] in another 2 or 3 weeks.   [name], MD 6/25/201810:35 AM

## 2016-11-01 NOTE — Telephone Encounter (Signed)
Pt states she had chemotherapy last week and since that treatment her stomach has been quite irritated. Reports she is having a lot of gas and bloating. States she has been taking phyzyme but it has not been helping. Pt has had some nausea also but her oncologist has given her something for the nausea. Pt wants to know what Dr. Hilarie Fredrickson would recommend for the gas and bloating. Please advise.

## 2016-11-02 NOTE — Telephone Encounter (Signed)
Patient has been scheduled for an appointment with Alonza Bogus, PA-C for evaluation of symptoms on 11/04/16 @ 3 pm. Patient verbalizes understanding.

## 2016-11-02 NOTE — Telephone Encounter (Signed)
-----   Message from Jerene Bears, MD sent at 11/01/2016 10:57 AM EDT ----- Regarding: Doristine Section called about  Pt needs OV Me or app  JMP

## 2016-11-02 NOTE — Telephone Encounter (Signed)
Patient will be seen on Thursday to discuss symptoms further

## 2016-11-04 ENCOUNTER — Ambulatory Visit (INDEPENDENT_AMBULATORY_CARE_PROVIDER_SITE_OTHER): Payer: 59 | Admitting: Gastroenterology

## 2016-11-04 ENCOUNTER — Encounter: Payer: Self-pay | Admitting: Gastroenterology

## 2016-11-04 VITALS — BP 110/68 | HR 84 | Ht 63.0 in | Wt 171.1 lb

## 2016-11-04 DIAGNOSIS — R1013 Epigastric pain: Secondary | ICD-10-CM | POA: Diagnosis not present

## 2016-11-04 DIAGNOSIS — K2970 Gastritis, unspecified, without bleeding: Secondary | ICD-10-CM | POA: Diagnosis not present

## 2016-11-04 DIAGNOSIS — K589 Irritable bowel syndrome without diarrhea: Secondary | ICD-10-CM

## 2016-11-04 DIAGNOSIS — R140 Abdominal distension (gaseous): Secondary | ICD-10-CM

## 2016-11-04 DIAGNOSIS — K2990 Gastroduodenitis, unspecified, without bleeding: Secondary | ICD-10-CM

## 2016-11-04 MED ORDER — PANTOPRAZOLE SODIUM 40 MG PO TBEC: 40.0000 mg | DELAYED_RELEASE_TABLET | Freq: Every day | ORAL | 5 refills | Status: DC

## 2016-11-04 NOTE — Progress Notes (Addendum)
11/04/2016 Barbara Cook [id number] 09-26-1955   HISTORY OF PRESENT ILLNESS:  This is a pleasant 61 year old female with history of GERD, IBS with abdominal bloating, multiple myeloma who is here with recurrent complaints of the same issues.  She says that her chemo regimen was recently increased/doubled and she thinks that it is messing with her GI symptoms.  She complains of a lot of upper abdominal bloating and discomfort.  Actually has not been taking her pantoprazole 40 mg daily because she says that she was feeling well for a while so she stopped it.  At her last visit with Dr. Hilarie Fredrickson in March she was told to try Phazyme but she does not feel like it is working well anymore.  She is still taking her Align probiotic and is actually taking the prebiotic as well.  She's had previous upper endoscopy and colonoscopy in August of last year. EGD showed some moderate inflammation with erosions and erythema in the stomach. Biopsies were unremarkable. Her colonoscopy was normal. CT scan of the abdomen and pelvis with contrast in 02/2016 was unremarkable except for possible constipation due to colonic stool burden.  She still using her Miralax for constipation.    Past Medical History:  Diagnosis Date  . Anemia   . Arthritis   . Arthropathy, unspecified, site unspecified   . Complication of anesthesia    02/13/14- had biospy in X-Ray- "twilight"  "I felt every thing"  . Constipation   . Endometriosis   . Family history of adverse reaction to anesthesia    Sister- patient will find out  . Fibroid   . GERD (gastroesophageal reflux disease)   . Heart murmur   . IBS (irritable bowel syndrome)   . MGUS (monoclonal gammopathy of unknown significance) 2020/06/2911  . Multiple myeloma ([place]) 02/18/2014  . Myelitis ([place]) 2011   of bone- neck  . Neuromuscular disorder ([place])   . Polymyalgia rheumatica ([place])   . PONV (postoperative nausea and vomiting)    1990   Past Surgical History:    Procedure Laterality Date  . COLONOSCOPY    . Fistula repair surg    . IR GENERIC HISTORICAL  04/14/2016   IR FLUORO GUIDE PORT INSERTION RIGHT 04/14/2016 Greggory Keen, MD [facility]  . IR GENERIC HISTORICAL  04/14/2016   IR US GUIDE VASC ACCESS RIGHT 04/14/2016 Greggory Keen, MD [facility]  . KNEE ARTHROSCOPY Left   . NECK SURGERY  2011   Myletis- bone grafting- from her left hip  . RADIOLOGY WITH ANESTHESIA N/A 08/23/2014   Procedure: T12  ABLATION       (RADIOLOGY WITH ANESTHESIA);  Surgeon: Luanne Bras, MD;  Location: [facility];  Service: Radiology;  Laterality: N/A;  . TUBAL LIGATION    . UMBILICAL HERNIA REPAIR     age 58    reports that she quit smoking about 22 years ago. Her smoking use included Cigarettes. She started smoking about 28 years ago. She has a 125.00 pack-year smoking history. She has never used smokeless tobacco. She reports that she does not drink alcohol or use drugs. family history includes Diabetes in her maternal grandmother; Heart disease in her maternal grandmother, mother, and paternal grandmother; Hypertension in her maternal grandmother; Lung cancer in her maternal uncle; Rheum arthritis in her mother; Throat cancer in her maternal aunt. Allergies  Allergen Reactions  . Codeine Palpitations      Outpatient Encounter Prescriptions as of 11/04/2016  Medication Sig  . acetaminophen (  TYLENOL) 500 MG tablet Take 500 mg by mouth every 6 (six) hours as needed for mild pain or headache.  . doxycycline (VIBRA-TABS) 100 MG tablet Take 1 tablet (100 mg total) by mouth 2 (two) times daily.  . hyoscyamine (LEVSIN SL) 0.125 MG SL tablet Place 1 tablet (0.125 mg total) under the tongue every 4 (four) hours as needed.  Marland Kitchen KLOR-CON 8 MEQ tablet 1 TABLET ONCE A DAY ORALLY 30 DAY(S)  . magnesium oxide (MAG-OX) 400 (241.3 Mg) MG tablet TAKE 1 TABLET BY MOUTH TWICE A DAY  . Multiple Vitamin (MULTIVITAMIN) tablet Take 1 tablet by mouth every evening.   . mupirocin cream  (BACTROBAN) 2 % Apply 1 application topically 2 (two) times daily.  . ondansetron (ZOFRAN) 8 MG tablet Take 1 tablet (8 mg total) by mouth 2 (two) times daily. For nausea & vomiting. Take 1 tablet 1 hour prior to chemotherapy.  . pantoprazole (PROTONIX) 40 MG tablet Take 1 tablet (40 mg total) by mouth daily.  . polyethylene glycol powder (GLYCOLAX/MIRALAX) powder 1 capful daily as needed  . pomalidomide (POMALYST) 3 MG capsule Take 1 capsule (3 mg total) by mouth daily. Take with water on days 1-21. Repeat every 28 days.  . Probiotic Product (ALIGN) 4 MG CAPS Take 1 capsule by mouth daily.  . Pyridoxine HCl (VITAMIN B-6) 250 MG tablet Take 1 tablet (250 mg total) by mouth daily.  Marland Kitchen spironolactone (ALDACTONE) 50 MG tablet Take 50 mg by mouth daily.  Marland Kitchen torsemide (DEMADEX) 20 MG tablet Take 1 tablet (20 mg total) by mouth daily as needed.  . Vitamin D, Ergocalciferol, (DRISDOL) 50000 units CAPS capsule TAKE 1 CAPSULE (50,000 UNITS TOTAL) BY MOUTH ONCE A WEEK.  . [DISCONTINUED] fluconazole (DIFLUCAN) 200 MG tablet Take 1 daily if needed for yeast infection  . [DISCONTINUED] ondansetron (ZOFRAN) 8 MG tablet TAKE 1 TABLET BY MOUTH TWICE A DAY AS NEEDED FOR NAUSEA AND VOMITING   No facility-administered encounter medications on file as of 11/04/2016.      REVIEW OF SYSTEMS  : All other systems reviewed and negative except where noted in the History of Present Illness.   PHYSICAL EXAM: BP 110/68 (BP Location: Left Arm, Patient Position: Sitting, Cuff Size: Normal)   Pulse 84   Ht _0  (1.6 m)   Wt 171 lb 2 oz (77.6 kg)   BMI 30.31 kg/m  General: Well developed black female in no acute distress Head: Normocephalic and atraumatic Eyes:  Sclerae anicteric, conjunctiva pink. Ears: Normal auditory acuity Lungs: Clear throughout to auscultation; no increased WOB. Heart: Regular rate and rhythm Abdomen: Soft, non-distended.  BS present.  Non-tender. Musculoskeletal: Symmetrical with no gross  deformities  Skin: No lesions on visible extremities Extremities: No edema  Neurological: Alert oriented x 4, grossly non-focal Psychological:  Alert and cooperative. Normal mood and affect  ASSESSMENT AND PLAN: -61 year old female with history of GERD, IBS with abdominal bloating, multiple myeloma who is here with the same complaints.  Dose of chemo was recently increased and she feels that it is messing with her GI symptoms.   1. Abdominal bloating -- felt to be a benign symptom after previous workup with upper endoscopy colonoscopy and CT scan of the abdomen. Recently worsened after increase in doses of her chemo and Phayzme does not seem to be helping.  Will try FDgard 12- capsules two or three times daily.   2. Epigastric pain/GERD/dyspepsia -- has been off of PPI because she had been feeling well for  a while.  Will restart pantoprazole 40 mg daily.   3. Constipation -- improved with MiraLAX 17 g 3-4 days per week. She will continue on her dosing regimen which has worked well to avoid constipation. She will also continue daily prebiotic/probiotic [facility], dispensing).  Reassurance given.   She will follow-up as needed.   CC:  [name], MD   Addendum: Reviewed and agree with ongoing management. Pyrtle, Lajuan Lines, MD

## 2016-11-04 NOTE — Patient Instructions (Signed)
If you are age 62 or older, your body mass index should be between 23-30. Your Body mass index is 30.31 kg/m. If this is out of the aforementioned range listed, please consider follow up with your Primary Care Provider.  If you are age 40 or younger, your body mass index should be between 19-25. Your Body mass index is 30.31 kg/m. If this is out of the aformentioned range listed, please consider follow up with your Primary Care Provider.   Your Pantoprazole prescription has been renewed with 5 refills.  Start FDgard - samples and coupons given.  Thank you for choosing me and [facility].   Alonza Bogus, PA-C

## 2016-11-08 ENCOUNTER — Other Ambulatory Visit: Payer: 59

## 2016-11-08 ENCOUNTER — Ambulatory Visit: Payer: 59

## 2016-11-15 ENCOUNTER — Other Ambulatory Visit: Payer: 59

## 2016-11-15 ENCOUNTER — Ambulatory Visit: Payer: 59

## 2016-11-22 ENCOUNTER — Ambulatory Visit: Payer: 59

## 2016-11-22 ENCOUNTER — Ambulatory Visit (HOSPITAL_BASED_OUTPATIENT_CLINIC_OR_DEPARTMENT_OTHER): Payer: 59

## 2016-11-22 ENCOUNTER — Other Ambulatory Visit (HOSPITAL_BASED_OUTPATIENT_CLINIC_OR_DEPARTMENT_OTHER): Payer: 59

## 2016-11-22 ENCOUNTER — Ambulatory Visit (HOSPITAL_BASED_OUTPATIENT_CLINIC_OR_DEPARTMENT_OTHER): Payer: 59 | Admitting: Hematology & Oncology

## 2016-11-22 VITALS — BP 125/69 | HR 97 | Temp 97.9°F | Resp 18 | Wt 169.0 lb

## 2016-11-22 DIAGNOSIS — C9000 Multiple myeloma not having achieved remission: Secondary | ICD-10-CM

## 2016-11-22 DIAGNOSIS — Z95828 Presence of other vascular implants and grafts: Secondary | ICD-10-CM

## 2016-11-22 LAB — CMP (CANCER CENTER ONLY)
ALBUMIN: 3.4 g/dL (ref 3.3–5.5)
ALT(SGPT): 23 U/L (ref 10–47)
AST: 24 U/L (ref 11–38)
Alkaline Phosphatase: 47 U/L (ref 26–84)
BUN, Bld: 21 mg/dL (ref 7–22)
CHLORIDE: 104 meq/L (ref 98–108)
CO2: 27 mEq/L (ref 18–33)
CREATININE: 0.9 mg/dL (ref 0.6–1.2)
Calcium: 9.2 mg/dL (ref 8.0–10.3)
GLUCOSE: 98 mg/dL (ref 73–118)
Potassium: 4.1 mEq/L (ref 3.3–4.7)
SODIUM: 139 meq/L (ref 128–145)
Total Bilirubin: 0.6 mg/dl (ref 0.20–1.60)
Total Protein: 7.2 g/dL (ref 6.4–8.1)

## 2016-11-22 LAB — CBC WITH DIFFERENTIAL (CANCER CENTER ONLY)
BASO#: 0 10e3/uL (ref 0.0–0.2)
BASO%: 0.8 % (ref 0.0–2.0)
EOS%: 3.5 % (ref 0.0–7.0)
Eosinophils Absolute: 0.1 10e3/uL (ref 0.0–0.5)
HCT: 30.4 % — ABNORMAL LOW (ref 34.8–46.6)
HGB: 10 g/dL — ABNORMAL LOW (ref 11.6–15.9)
LYMPH#: 1 10e3/uL (ref 0.9–3.3)
LYMPH%: 37.1 % (ref 14.0–48.0)
MCH: 30.9 pg (ref 26.0–34.0)
MCHC: 32.9 g/dL (ref 32.0–36.0)
MCV: 94 fL (ref 81–101)
MONO#: 0.4 10e3/uL (ref 0.1–0.9)
MONO%: 16 % — ABNORMAL HIGH (ref 0.0–13.0)
NEUT#: 1.1 10e3/uL — ABNORMAL LOW (ref 1.5–6.5)
NEUT%: 42.6 % (ref 39.6–80.0)
Platelets: 120 10e3/uL — ABNORMAL LOW (ref 145–400)
RBC: 3.24 10e6/uL — ABNORMAL LOW (ref 3.70–5.32)
RDW: 13.7 % (ref 11.1–15.7)
WBC: 2.6 10e3/uL — ABNORMAL LOW (ref 3.9–10.0)

## 2016-11-22 MED ORDER — HEPARIN SOD (PORK) LOCK FLUSH 100 UNIT/ML IV SOLN
500.0000 [IU] | Freq: Once | INTRAVENOUS | Status: AC
  Administered 2016-11-22: 500 [IU] via INTRAVENOUS
  Filled 2016-11-22: qty 5

## 2016-11-22 MED ORDER — SODIUM CHLORIDE 0.9% FLUSH
10.0000 mL | INTRAVENOUS | Status: DC | PRN
  Administered 2016-11-22: 10 mL via INTRAVENOUS
  Filled 2016-11-22: qty 10

## 2016-11-22 NOTE — Progress Notes (Signed)
Hematology and Oncology Follow Up Visit  Barbara Cook [id number] 1955/12/01 61 y.o. 11/22/2016   Principle Diagnosis:  IgG kappa myeloma  Current Therapy:   Kyprolis q week (3/1) - d/c'ed on 11/01/2016 for toxicity   Interim History:  Barbara Cook is here today with her husband for follow-up. She is now has been off treatment for a month. She feels better. She and her husband will be headed to [place] this weekend. They will be going down for about a week.  Her last myeloma studies back in mid June showed a M spike of 1.4 g/dL. Her IgG level was 1946 milligrams per deciliter. Her Kappa Lightchain was 2.1 mg/dL.  She's had no fever. She's had no pain. She's had no cough or shortness of breath. She's had no nausea or vomiting. She's had no headache. She's had no rashes.   Overall, her performance status is ECOG 1.   Medications:  Allergies as of 11/22/2016      Reactions   Codeine Palpitations      Medication List       Accurate as of 11/22/16 10:13 AM. Always use your most recent med list.          acetaminophen 500 MG tablet Commonly known as:  TYLENOL Take 500 mg by mouth every 6 (six) hours as needed for mild pain or headache.   ALIGN 4 MG Caps Take 1 capsule by mouth daily.   doxycycline 100 MG tablet Commonly known as:  VIBRA-TABS Take 1 tablet (100 mg total) by mouth 2 (two) times daily.   hyoscyamine 0.125 MG SL tablet Commonly known as:  LEVSIN SL Place 1 tablet (0.125 mg total) under the tongue every 4 (four) hours as needed.   KLOR-CON 8 MEQ tablet Generic drug:  potassium chloride 1 TABLET ONCE A DAY ORALLY 30 DAY(S)   magnesium oxide 400 (241.3 Mg) MG tablet Commonly known as:  MAG-OX TAKE 1 TABLET BY MOUTH TWICE A DAY   multivitamin tablet Take 1 tablet by mouth every evening.   mupirocin cream 2 % Commonly known as:  BACTROBAN Apply 1 application topically 2 (two) times daily.   ondansetron 8 MG tablet Commonly known as:  ZOFRAN Take 1  tablet (8 mg total) by mouth 2 (two) times daily. For nausea & vomiting. Take 1 tablet 1 hour prior to chemotherapy.   pantoprazole 40 MG tablet Commonly known as:  PROTONIX Take 1 tablet (40 mg total) by mouth daily.   polyethylene glycol powder powder Commonly known as:  GLYCOLAX/MIRALAX 1 capful daily as needed   pomalidomide 3 MG capsule Commonly known as:  POMALYST Take 1 capsule (3 mg total) by mouth daily. Take with water on days 1-21. Repeat every 28 days.   spironolactone 50 MG tablet Commonly known as:  ALDACTONE Take 50 mg by mouth daily.   torsemide 20 MG tablet Commonly known as:  DEMADEX Take 1 tablet (20 mg total) by mouth daily as needed.   vitamin B-6 250 MG tablet Take 1 tablet (250 mg total) by mouth daily.   Vitamin D (Ergocalciferol) 50000 units Caps capsule Commonly known as:  DRISDOL TAKE 1 CAPSULE (50,000 UNITS TOTAL) BY MOUTH ONCE A WEEK.       Allergies:  Allergies  Allergen Reactions  . Codeine Palpitations    Past Medical History, Surgical history, Social history, and Family History were reviewed and updated.  Review of Systems: All other 10 point review of systems is negative.   Physical Exam:  weight is 169 lb (76.7 kg). Her oral temperature is 97.9 F (36.6 C). Her blood pressure is 125/69 and her pulse is 97. Her respiration is 18 and oxygen saturation is 100%.   Wt Readings from Last 3 Encounters:  11/22/16 169 lb (76.7 kg)  11/04/16 171 lb 2 oz (77.6 kg)  10/15/16 168 lb (76.2 kg)    Well-developed and well-nourished [place] American female. Head and neck exam shows no ocular or oral lesions. There are no palpable cervical or supraclavicular lymph nodes.There is some tenderness to palpation just to the left of the midline at the base of her neck. I cannot palpate any lymph nodes. I cannot palpate her thyroid. She has a good carotid pulse. Lungs are clear. Cardiac exam regular rate and rhythm with no murmurs, rubs or bruits.  Abdomen is soft. She is mildly obese. She really has no abdominal distention. She has no palpable liver or spleen tip. Back exam shows no tenderness over the spine, ribs or hips. Extremity shows no clubbing, cyanosis or edema. Skin exam shows no rashes, ecchymoses or petechia. Neurological exam is nonfocal.   Lab Results  Component Value Date   WBC 2.6 (L) 11/22/2016   HGB 10.0 (L) 11/22/2016   HCT 30.4 (L) 11/22/2016   MCV 94 11/22/2016   PLT 120 (L) 11/22/2016   Lab Results  Component Value Date   FERRITIN 102 02/20/2016   IRON 81 02/20/2016   TIBC 316 02/20/2016   UIBC 235 02/20/2016   IRONPCTSAT 26 02/20/2016   Lab Results  Component Value Date   RETICCTPCT 1.1 12/25/2014   RBC 3.24 (L) 11/22/2016   RETICCTABS 35.9 12/25/2014   Lab Results  Component Value Date   KPAFRELGTCHN 7.11 (H) 05/07/2015   LAMBDASER 1.65 05/07/2015   KAPLAMBRATIO 2.58 (H) 10/25/2016   Lab Results  Component Value Date   IGGSERUM 1,946 (H) 10/25/2016   IGA 116 05/07/2015   IGMSERUM 19 (L) 10/25/2016   Lab Results  Component Value Date   TOTALPROTELP 7.8 05/07/2015   ALBUMINELP 4.2 05/07/2015   A1GS 0.3 05/07/2015   A2GS 0.8 05/07/2015   BETS 0.4 05/07/2015   BETA2SER 0.3 05/07/2015   GAMS 1.9 (H) 05/07/2015   MSPIKE 1.4 (H) 10/25/2016   SPEI * 05/07/2015     Chemistry      Component Value Date/Time   NA 142 11/01/2016 0909   NA 139 02/20/2016 1207   K 4.0 11/01/2016 0909   K 3.8 02/20/2016 1207   CL 106 11/01/2016 0909   CO2 26 11/01/2016 0909   CO2 29 02/20/2016 1207   BUN 18 11/01/2016 0909   BUN 22.3 02/20/2016 1207   CREATININE 0.9 11/01/2016 0909   CREATININE 1.3 (H) 02/20/2016 1207      Component Value Date/Time   CALCIUM 9.5 11/01/2016 0909   CALCIUM 9.7 02/20/2016 1207   ALKPHOS 43 11/01/2016 0909   ALKPHOS 62 02/20/2016 1207   AST 27 11/01/2016 0909   AST 27 02/20/2016 1207   ALT 25 11/01/2016 0909   ALT 19 02/20/2016 1207   BILITOT 0.80 11/01/2016 0909    BILITOT 0.44 02/20/2016 1207      Impression and Plan: Barbara Cook is a very pleasant 61 yo African American female with IgG myeloma.   It will be very interested to see what her myeloma studies show. Hopefully, we will see continued improvement.  I will like to try to keep her off myeloma therapy for as long as possible.  I will see her back in another month.    [name], MD 7/16/201810:13 AM

## 2016-11-23 LAB — KAPPA/LAMBDA LIGHT CHAINS
Ig Kappa Free Light Chain: 22.8 mg/L — ABNORMAL HIGH (ref 3.3–19.4)
Ig Lambda Free Light Chain: 8.7 mg/L (ref 5.7–26.3)
Kappa/Lambda FluidC Ratio: 2.62 — ABNORMAL HIGH (ref 0.26–1.65)

## 2016-11-23 LAB — IGG, IGA, IGM
IGM (IMMUNOGLOBIN M), SRM: 17 mg/dL — AB (ref 26–217)
IgA, Qn, Serum: 73 mg/dL — ABNORMAL LOW (ref 87–352)
IgG, Qn, Serum: 1858 mg/dL — ABNORMAL HIGH (ref 700–1600)

## 2016-11-29 LAB — PROTEIN ELECTROPHORESIS, SERUM, WITH REFLEX
A/G Ratio: 1.2 (ref 0.7–1.7)
ALBUMIN: 3.8 g/dL (ref 2.9–4.4)
Alpha 1: 0.2 g/dL (ref 0.0–0.4)
Alpha 2: 0.5 g/dL (ref 0.4–1.0)
Beta: 0.9 g/dL (ref 0.7–1.3)
Gamma Globulin: 1.7 g/dL (ref 0.4–1.8)
Globulin, Total: 3.2 g/dL (ref 2.2–3.9)
Interpretation(See Below): 0
M-Spike, %: 1.2 g/dL — ABNORMAL HIGH
TOTAL PROTEIN: 7 g/dL (ref 6.0–8.5)

## 2016-12-03 ENCOUNTER — Other Ambulatory Visit: Payer: Self-pay | Admitting: *Deleted

## 2016-12-06 ENCOUNTER — Other Ambulatory Visit: Payer: 59

## 2016-12-06 ENCOUNTER — Ambulatory Visit: Payer: 59

## 2016-12-08 ENCOUNTER — Telehealth: Payer: Self-pay | Admitting: Internal Medicine

## 2016-12-08 NOTE — Telephone Encounter (Signed)
Pt states her stool is mushy and not formed. Discussed with patient that she could try taking metamucil 1-2 tablespoons/day. Instructed pt to call back if this did not help, she verbalized understanding.

## 2016-12-13 ENCOUNTER — Other Ambulatory Visit: Payer: 59

## 2016-12-13 ENCOUNTER — Ambulatory Visit: Payer: 59

## 2016-12-20 ENCOUNTER — Other Ambulatory Visit (HOSPITAL_BASED_OUTPATIENT_CLINIC_OR_DEPARTMENT_OTHER): Payer: 59

## 2016-12-20 ENCOUNTER — Ambulatory Visit (HOSPITAL_BASED_OUTPATIENT_CLINIC_OR_DEPARTMENT_OTHER): Payer: 59

## 2016-12-20 ENCOUNTER — Ambulatory Visit (HOSPITAL_BASED_OUTPATIENT_CLINIC_OR_DEPARTMENT_OTHER): Payer: 59 | Admitting: Hematology & Oncology

## 2016-12-20 VITALS — Wt 171.0 lb

## 2016-12-20 VITALS — BP 111/57 | HR 82 | Temp 97.5°F | Resp 18

## 2016-12-20 DIAGNOSIS — Z95828 Presence of other vascular implants and grafts: Secondary | ICD-10-CM

## 2016-12-20 DIAGNOSIS — C9000 Multiple myeloma not having achieved remission: Secondary | ICD-10-CM

## 2016-12-20 LAB — CMP (CANCER CENTER ONLY)
ALK PHOS: 51 U/L (ref 26–84)
ALT: 22 U/L (ref 10–47)
AST: 28 U/L (ref 11–38)
Albumin: 3.5 g/dL (ref 3.3–5.5)
BILIRUBIN TOTAL: 0.8 mg/dL (ref 0.20–1.60)
BUN, Bld: 15 mg/dL (ref 7–22)
CO2: 29 mEq/L (ref 18–33)
Calcium: 8.9 mg/dL (ref 8.0–10.3)
Chloride: 108 mEq/L (ref 98–108)
Creat: 0.8 mg/dl (ref 0.6–1.2)
GLUCOSE: 134 mg/dL — AB (ref 73–118)
Potassium: 3.7 mEq/L (ref 3.3–4.7)
Sodium: 140 mEq/L (ref 128–145)
Total Protein: 8.3 g/dL — ABNORMAL HIGH (ref 6.4–8.1)

## 2016-12-20 LAB — CBC WITH DIFFERENTIAL (CANCER CENTER ONLY)
BASO#: 0 10*3/uL (ref 0.0–0.2)
BASO%: 0.4 % (ref 0.0–2.0)
EOS ABS: 0.1 10*3/uL (ref 0.0–0.5)
EOS%: 1.8 % (ref 0.0–7.0)
HCT: 30.7 % — ABNORMAL LOW (ref 34.8–46.6)
HGB: 10.2 g/dL — ABNORMAL LOW (ref 11.6–15.9)
LYMPH#: 1 10*3/uL (ref 0.9–3.3)
LYMPH%: 37.2 % (ref 14.0–48.0)
MCH: 31.2 pg (ref 26.0–34.0)
MCHC: 33.2 g/dL (ref 32.0–36.0)
MCV: 94 fL (ref 81–101)
MONO#: 0.3 10*3/uL (ref 0.1–0.9)
MONO%: 10.5 % (ref 0.0–13.0)
NEUT#: 1.4 10*3/uL — ABNORMAL LOW (ref 1.5–6.5)
NEUT%: 50.1 % (ref 39.6–80.0)
PLATELETS: 116 10*3/uL — AB (ref 145–400)
RBC: 3.27 10*6/uL — AB (ref 3.70–5.32)
RDW: 13.1 % (ref 11.1–15.7)
WBC: 2.8 10*3/uL — AB (ref 3.9–10.0)

## 2016-12-20 MED ORDER — SODIUM CHLORIDE 0.9% FLUSH
10.0000 mL | INTRAVENOUS | Status: DC | PRN
  Administered 2016-12-20: 10 mL via INTRAVENOUS
  Filled 2016-12-20: qty 10

## 2016-12-20 MED ORDER — HEPARIN SOD (PORK) LOCK FLUSH 100 UNIT/ML IV SOLN
500.0000 [IU] | Freq: Once | INTRAVENOUS | Status: AC
Start: 2016-12-20 — End: 2016-12-20
  Administered 2016-12-20: 500 [IU] via INTRAVENOUS
  Filled 2016-12-20: qty 5

## 2016-12-20 NOTE — Progress Notes (Signed)
Hematology and Oncology Follow Up Visit  Barbara Cook [id number] May 06, 1956 61 y.o. 12/20/2016   Principle Diagnosis:  IgG kappa myeloma  Current Therapy:   Kyprolis q week (3/1) - d/c'ed on 11/01/2016 for toxicity   Interim History:  Barbara Cook is here today with her husband for follow-up. She is now has been off treatment for 2 months. She feels better. She and her husband had a wonderful time down in [place]. They're down at [employer] with some family members. They had a wonderful time despite the inclement weather.  They then went up to [place]. That a good time up in [place]. They saw the new [employer] [facility]. There are very impressed by this.  Her last monoclonal studies showed an M spike to be 1.2 g/dL. Her IgG level was 1858 milligrams per deciliter. Her is an improvement. Her Light chain was 2.3 mg/dL.  She's had no problems with cough or shortness of breath. She's had no rashes. She still has occasional diarrhea.  There's been no nausea or vomiting. She's had no leg swelling. She's had no fever.   Overall, her performance status is ECOG 1.   Medications:  Allergies as of 12/20/2016      Reactions   Codeine Palpitations      Medication List       Accurate as of 12/20/16 10:12 AM. Always use your most recent med list.          acetaminophen 500 MG tablet Commonly known as:  TYLENOL Take 500 mg by mouth every 6 (six) hours as needed for mild pain or headache.   ALIGN 4 MG Caps Take 1 capsule by mouth daily.   doxycycline 100 MG tablet Commonly known as:  VIBRA-TABS Take 1 tablet (100 mg total) by mouth 2 (two) times daily.   hyoscyamine 0.125 MG SL tablet Commonly known as:  LEVSIN SL Place 1 tablet (0.125 mg total) under the tongue every 4 (four) hours as needed.   KLOR-CON 8 MEQ tablet Generic drug:  potassium chloride 1 TABLET ONCE A DAY ORALLY 30 DAY(S)   magnesium oxide 400 (241.3 Mg) MG tablet Commonly known as:   MAG-OX TAKE 1 TABLET BY MOUTH TWICE A DAY   multivitamin tablet Take 1 tablet by mouth every evening.   mupirocin cream 2 % Commonly known as:  BACTROBAN Apply 1 application topically 2 (two) times daily.   ondansetron 8 MG tablet Commonly known as:  ZOFRAN Take 1 tablet (8 mg total) by mouth 2 (two) times daily. For nausea & vomiting. Take 1 tablet 1 hour prior to chemotherapy.   pantoprazole 40 MG tablet Commonly known as:  PROTONIX Take 1 tablet (40 mg total) by mouth daily.   polyethylene glycol powder powder Commonly known as:  GLYCOLAX/MIRALAX 1 capful daily as needed   pomalidomide 3 MG capsule Commonly known as:  POMALYST Take 1 capsule (3 mg total) by mouth daily. Take with water on days 1-21. Repeat every 28 days.   spironolactone 50 MG tablet Commonly known as:  ALDACTONE Take 50 mg by mouth daily.   torsemide 20 MG tablet Commonly known as:  DEMADEX Take 1 tablet (20 mg total) by mouth daily as needed.   vitamin B-6 250 MG tablet Take 1 tablet (250 mg total) by mouth daily.   Vitamin D (Ergocalciferol) 50000 units Caps capsule Commonly known as:  DRISDOL TAKE 1 CAPSULE (50,000 UNITS TOTAL) BY MOUTH ONCE A WEEK.  Allergies:  Allergies  Allergen Reactions  . Codeine Palpitations    Past Medical History, Surgical history, Social history, and Family History were reviewed and updated.  Review of Systems: All other 10 point review of systems is negative.   Physical Exam:  weight is 171 lb (77.6 kg).   Wt Readings from Last 3 Encounters:  12/20/16 171 lb (77.6 kg)  11/22/16 169 lb (76.7 kg)  11/04/16 171 lb 2 oz (77.6 kg)    Well-developed and well-nourished [place] American female. Head and neck exam shows no ocular or oral lesions. There are no palpable cervical or supraclavicular lymph nodes.There is some tenderness to palpation just to the left of the midline at the base of her neck. I cannot palpate any lymph nodes. I cannot palpate her  thyroid. She has a good carotid pulse. Lungs are clear. Cardiac exam regular rate and rhythm with no murmurs, rubs or bruits. Abdomen is soft. She is mildly obese. She really has no abdominal distention. She has no palpable liver or spleen tip. Back exam shows no tenderness over the spine, ribs or hips. Extremity shows no clubbing, cyanosis or edema. Skin exam shows no rashes, ecchymoses or petechia. Neurological exam is nonfocal.   Lab Results  Component Value Date   WBC 2.8 (L) 12/20/2016   HGB 10.2 (L) 12/20/2016   HCT 30.7 (L) 12/20/2016   MCV 94 12/20/2016   PLT 116 (L) 12/20/2016   Lab Results  Component Value Date   FERRITIN 102 02/20/2016   IRON 81 02/20/2016   TIBC 316 02/20/2016   UIBC 235 02/20/2016   IRONPCTSAT 26 02/20/2016   Lab Results  Component Value Date   RETICCTPCT 1.1 12/25/2014   RBC 3.27 (L) 12/20/2016   RETICCTABS 35.9 12/25/2014   Lab Results  Component Value Date   KPAFRELGTCHN 7.11 (H) 05/07/2015   LAMBDASER 1.65 05/07/2015   KAPLAMBRATIO 2.62 (H) 11/22/2016   Lab Results  Component Value Date   IGGSERUM 1,858 (H) 11/22/2016   IGA 116 05/07/2015   IGMSERUM 17 (L) 11/22/2016   Lab Results  Component Value Date   TOTALPROTELP 7.8 05/07/2015   ALBUMINELP 4.2 05/07/2015   A1GS 0.3 05/07/2015   A2GS 0.8 05/07/2015   BETS 0.4 05/07/2015   BETA2SER 0.3 05/07/2015   GAMS 1.9 (H) 05/07/2015   MSPIKE 1.2 (H) 11/22/2016   SPEI * 05/07/2015     Chemistry      Component Value Date/Time   NA 140 12/20/2016 0855   NA 139 02/20/2016 1207   K 3.7 12/20/2016 0855   K 3.8 02/20/2016 1207   CL 108 12/20/2016 0855   CO2 29 12/20/2016 0855   CO2 29 02/20/2016 1207   BUN 15 12/20/2016 0855   BUN 22.3 02/20/2016 1207   CREATININE 0.8 12/20/2016 0855   CREATININE 1.3 (H) 02/20/2016 1207      Component Value Date/Time   CALCIUM 8.9 12/20/2016 0855   CALCIUM 9.7 02/20/2016 1207   ALKPHOS 51 12/20/2016 0855   ALKPHOS 62 02/20/2016 1207   AST 28  12/20/2016 0855   AST 27 02/20/2016 1207   ALT 22 12/20/2016 0855   ALT 19 02/20/2016 1207   BILITOT 0.80 12/20/2016 0855   BILITOT 0.44 02/20/2016 1207      Impression and Plan: Barbara Cook is a very pleasant 61 yo African American female with IgG myeloma.   It will be very interested to see what her myeloma studies show.Her total protein is up. This could clearly  indicate that her IgG level is up.  If so, then we will have to embark upon therapy. I probably would consider Darzalex. I think this would be very reasonable. She had a tough time with Kyprolis. Even though Kyprolis worked nicely, she just had issues with this.  I would like to see her back in 3 weeks. We will see her back sooner if necessary.   [name], MD 8/13/201810:12 AM

## 2016-12-21 LAB — KAPPA/LAMBDA LIGHT CHAINS
IG KAPPA FREE LIGHT CHAIN: 38.1 mg/L — AB (ref 3.3–19.4)
IG LAMBDA FREE LIGHT CHAIN: 8.5 mg/L (ref 5.7–26.3)
Kappa/Lambda FluidC Ratio: 4.48 — ABNORMAL HIGH (ref 0.26–1.65)

## 2016-12-21 LAB — IGG, IGA, IGM
IGA/IMMUNOGLOBULIN A, SERUM: 65 mg/dL — AB (ref 87–352)
IgM, Qn, Serum: 17 mg/dL — ABNORMAL LOW (ref 26–217)

## 2016-12-23 LAB — PROTEIN ELECTROPHORESIS, SERUM, WITH REFLEX
A/G Ratio: 1 (ref 0.7–1.7)
ALPHA 2: 0.5 g/dL (ref 0.4–1.0)
Albumin: 3.6 g/dL (ref 2.9–4.4)
Alpha 1: 0.2 g/dL (ref 0.0–0.4)
BETA: 0.9 g/dL (ref 0.7–1.3)
GLOBULIN, TOTAL: 3.7 g/dL (ref 2.2–3.9)
Gamma Globulin: 2.1 g/dL — ABNORMAL HIGH (ref 0.4–1.8)
INTERPRETATION(SEE BELOW): 0
M-SPIKE, %: 1.7 g/dL — AB
Total Protein: 7.3 g/dL (ref 6.0–8.5)

## 2016-12-24 ENCOUNTER — Ambulatory Visit (HOSPITAL_BASED_OUTPATIENT_CLINIC_OR_DEPARTMENT_OTHER): Payer: 59 | Admitting: Hematology & Oncology

## 2016-12-24 VITALS — BP 120/62 | HR 79 | Temp 97.8°F | Resp 16 | Wt 168.2 lb

## 2016-12-24 DIAGNOSIS — C9000 Multiple myeloma not having achieved remission: Secondary | ICD-10-CM

## 2016-12-24 NOTE — Progress Notes (Signed)
Hematology and Oncology Follow Up Visit  Barbara Cook [id number] 10-21-1955 61 y.o. 12/24/2016   Principle Diagnosis:  IgG kappa myeloma  Current Therapy:   Kyprolis q week (3/1) - d/c'ed on 11/01/2016 for toxicity   Interim History:  [name] is here today with her husband for follow-up. Unfortunately, it is apparent that her myeloma is now active. Her M spike last week was 1.7 g/dL. Her IgG level was 2332 milligrams per deciliter. Her Kappa light chain was 3.8 mg/dL.  We have had her off treatment probably for about 2 or 3 months. She has had a very nice summer. She her husband have traveled. There are down in [place]. They were up in [place].  She has had multiple treatments. However, she just has a slight constitution and just cannot tolerate therapy.  She is totally asymptomatic. She is having no fever. There is no pain. There is no change in bowel or bladder habits. She's had no rashes. There's been no leg swelling.   Her appetite is okay. She's had no nausea or vomiting.  There is no cough. There is no bleeding.  Overall, her performance status is ECOG 1.   Medications:  Allergies as of 12/24/2016      Reactions   Codeine Palpitations      Medication List       Accurate as of 12/24/16  5:02 PM. Always use your most recent med list.          acetaminophen 500 MG tablet Commonly known as:  TYLENOL Take 500 mg by mouth every 6 (six) hours as needed for mild pain or headache.   ALIGN 4 MG Caps Take 1 capsule by mouth daily.   doxycycline 100 MG tablet Commonly known as:  VIBRA-TABS Take 1 tablet (100 mg total) by mouth 2 (two) times daily.   hyoscyamine 0.125 MG SL tablet Commonly known as:  LEVSIN SL Place 1 tablet (0.125 mg total) under the tongue every 4 (four) hours as needed.   KLOR-CON 8 MEQ tablet Generic drug:  potassium chloride 1 TABLET ONCE A DAY ORALLY 30 DAY(S)   magnesium oxide 400 (241.3 Mg) MG tablet Commonly known as:   MAG-OX TAKE 1 TABLET BY MOUTH TWICE A DAY   multivitamin tablet Take 1 tablet by mouth every evening.   mupirocin cream 2 % Commonly known as:  BACTROBAN Apply 1 application topically 2 (two) times daily.   ondansetron 8 MG tablet Commonly known as:  ZOFRAN Take 1 tablet (8 mg total) by mouth 2 (two) times daily. For nausea & vomiting. Take 1 tablet 1 hour prior to chemotherapy.   pantoprazole 40 MG tablet Commonly known as:  PROTONIX Take 1 tablet (40 mg total) by mouth daily.   polyethylene glycol powder powder Commonly known as:  GLYCOLAX/MIRALAX 1 capful daily as needed   pomalidomide 3 MG capsule Commonly known as:  POMALYST Take 1 capsule (3 mg total) by mouth daily. Take with water on days 1-21. Repeat every 28 days.   spironolactone 50 MG tablet Commonly known as:  ALDACTONE Take 50 mg by mouth daily.   torsemide 20 MG tablet Commonly known as:  DEMADEX Take 1 tablet (20 mg total) by mouth daily as needed.   vitamin B-6 250 MG tablet Take 1 tablet (250 mg total) by mouth daily.   Vitamin D (Ergocalciferol) 50000 units Caps capsule Commonly known as:  DRISDOL TAKE 1 CAPSULE (50,000 UNITS TOTAL) BY MOUTH ONCE A WEEK.  Allergies:  Allergies  Allergen Reactions  . Codeine Palpitations    Past Medical History, Surgical history, Social history, and Family History were reviewed and updated.  Review of Systems: All other 10 point review of systems is negative.   Physical Exam:  weight is 168 lb 3.2 oz (76.3 kg). Her oral temperature is 97.8 F (36.6 C). Her blood pressure is 120/62 and her pulse is 79. Her respiration is 16 and oxygen saturation is 99%.   Wt Readings from Last 3 Encounters:  12/24/16 168 lb 3.2 oz (76.3 kg)  12/20/16 171 lb (77.6 kg)  11/22/16 169 lb (76.7 kg)    Well-developed and well-nourished [place] female. Head and neck exam shows no ocular or oral lesions. There are no palpable cervical or supraclavicular lymph  nodes.There is some tenderness to palpation just to the left of the midline at the base of her neck. I cannot palpate any lymph nodes. I cannot palpate her thyroid. She has a good carotid pulse. Lungs are clear. Cardiac exam regular rate and rhythm with no murmurs, rubs or bruits. Abdomen is soft. She is mildly obese. She really has no abdominal distention. She has no palpable liver or spleen tip. Back exam shows no tenderness over the spine, ribs or hips. Extremity shows no clubbing, cyanosis or edema. Skin exam shows no rashes, ecchymoses or petechia. Neurological exam is nonfocal.   Lab Results  Component Value Date   WBC 2.8 (L) 12/20/2016   HGB 10.2 (L) 12/20/2016   HCT 30.7 (L) 12/20/2016   MCV 94 12/20/2016   PLT 116 (L) 12/20/2016   Lab Results  Component Value Date   FERRITIN 102 02/20/2016   IRON 81 02/20/2016   TIBC 316 02/20/2016   UIBC 235 02/20/2016   IRONPCTSAT 26 02/20/2016   Lab Results  Component Value Date   RETICCTPCT 1.1 12/25/2014   RBC 3.27 (L) 12/20/2016   RETICCTABS 35.9 12/25/2014   Lab Results  Component Value Date   KPAFRELGTCHN 7.11 (H) 05/07/2015   LAMBDASER 1.65 05/07/2015   KAPLAMBRATIO 4.48 (H) 12/20/2016   Lab Results  Component Value Date   IGGSERUM 2,332 (H) 12/20/2016   IGA 116 05/07/2015   IGMSERUM 17 (L) 12/20/2016   Lab Results  Component Value Date   TOTALPROTELP 7.8 05/07/2015   ALBUMINELP 4.2 05/07/2015   A1GS 0.3 05/07/2015   A2GS 0.8 05/07/2015   BETS 0.4 05/07/2015   BETA2SER 0.3 05/07/2015   GAMS 1.9 (H) 05/07/2015   MSPIKE 1.7 (H) 12/20/2016   SPEI * 05/07/2015     Chemistry      Component Value Date/Time   NA 140 12/20/2016 0855   NA 139 02/20/2016 1207   K 3.7 12/20/2016 0855   K 3.8 02/20/2016 1207   CL 108 12/20/2016 0855   CO2 29 12/20/2016 0855   CO2 29 02/20/2016 1207   BUN 15 12/20/2016 0855   BUN 22.3 02/20/2016 1207   CREATININE 0.8 12/20/2016 0855   CREATININE 1.3 (H) 02/20/2016 1207        Component Value Date/Time   CALCIUM 8.9 12/20/2016 0855   CALCIUM 9.7 02/20/2016 1207   ALKPHOS 51 12/20/2016 0855   ALKPHOS 62 02/20/2016 1207   AST 28 12/20/2016 0855   AST 27 02/20/2016 1207   ALT 22 12/20/2016 0855   ALT 19 02/20/2016 1207   BILITOT 0.80 12/20/2016 0855   BILITOT 0.44 02/20/2016 1207      Impression and Plan: Ms. Ammar is a very  pleasant 61 yo [place] American female with IgG myeloma.   I spent about 40 minutes with she and her husband. I went over many times the lab reports. She just gets very confused. Unfortunately, she and her husband get on the Internet and start looking up information.  I think that, from my perspective, it would be worthwhile adding Cytoxan to Kyprolis. I think this would be a very reasonable way of trying to help her myeloma. I would have to do the Kyprolis twice a week as she cannot tolerate the single week dose.  The other option that I think would be reasonable would be monoclonal antibody therapy with Daratumumab. I gave her and her husband information about both Daratumumab and Cytoxan. I told her that with the Daratumumab, there is always a risk of an allergic reaction. We would give her medicine to try to minimize that risk.  I explained to them the protocol for each therapy and how often she would have to be here and how long each treatment would be.  There points to think about what to do. I told them that she clearly had time. Her myeloma dies just not that high. I just want to be proactive and tried to make changes before her myeloma numbers increased significantly.   They will let me know what decision they have made.    [name], MD 8/17/20185:02 PM

## 2017-01-03 DIAGNOSIS — M1712 Unilateral primary osteoarthritis, left knee: Secondary | ICD-10-CM | POA: Diagnosis not present

## 2017-01-13 ENCOUNTER — Ambulatory Visit: Payer: 59 | Admitting: Hematology & Oncology

## 2017-01-13 ENCOUNTER — Other Ambulatory Visit (HOSPITAL_BASED_OUTPATIENT_CLINIC_OR_DEPARTMENT_OTHER): Payer: 59

## 2017-01-13 ENCOUNTER — Ambulatory Visit (HOSPITAL_BASED_OUTPATIENT_CLINIC_OR_DEPARTMENT_OTHER): Payer: 59

## 2017-01-13 VITALS — BP 127/60 | HR 78 | Temp 98.1°F | Wt 172.5 lb

## 2017-01-13 VITALS — BP 127/60 | HR 78 | Temp 98.1°F | Resp 16

## 2017-01-13 DIAGNOSIS — M25562 Pain in left knee: Secondary | ICD-10-CM

## 2017-01-13 DIAGNOSIS — C9000 Multiple myeloma not having achieved remission: Secondary | ICD-10-CM

## 2017-01-13 DIAGNOSIS — M899 Disorder of bone, unspecified: Secondary | ICD-10-CM

## 2017-01-13 DIAGNOSIS — M25462 Effusion, left knee: Secondary | ICD-10-CM

## 2017-01-13 LAB — CBC WITH DIFFERENTIAL (CANCER CENTER ONLY)
BASO#: 0 10*3/uL (ref 0.0–0.2)
BASO%: 0.3 % (ref 0.0–2.0)
EOS%: 1.2 % (ref 0.0–7.0)
Eosinophils Absolute: 0 10*3/uL (ref 0.0–0.5)
HEMATOCRIT: 29.8 % — AB (ref 34.8–46.6)
HGB: 10 g/dL — ABNORMAL LOW (ref 11.6–15.9)
LYMPH#: 1.1 10*3/uL (ref 0.9–3.3)
LYMPH%: 31.4 % (ref 14.0–48.0)
MCH: 31.2 pg (ref 26.0–34.0)
MCHC: 33.6 g/dL (ref 32.0–36.0)
MCV: 93 fL (ref 81–101)
MONO#: 0.4 10*3/uL (ref 0.1–0.9)
MONO%: 12.3 % (ref 0.0–13.0)
NEUT#: 1.9 10*3/uL (ref 1.5–6.5)
NEUT%: 54.8 % (ref 39.6–80.0)
Platelets: 146 10*3/uL (ref 145–400)
RBC: 3.21 10*6/uL — ABNORMAL LOW (ref 3.70–5.32)
RDW: 13.1 % (ref 11.1–15.7)
WBC: 3.4 10*3/uL — ABNORMAL LOW (ref 3.9–10.0)

## 2017-01-13 LAB — CMP (CANCER CENTER ONLY)
ALBUMIN: 3.4 g/dL (ref 3.3–5.5)
ALK PHOS: 44 U/L (ref 26–84)
ALT: 22 U/L (ref 10–47)
AST: 25 U/L (ref 11–38)
BILIRUBIN TOTAL: 0.7 mg/dL (ref 0.20–1.60)
BUN, Bld: 21 mg/dL (ref 7–22)
CALCIUM: 9.6 mg/dL (ref 8.0–10.3)
CO2: 29 mEq/L (ref 18–33)
Chloride: 106 mEq/L (ref 98–108)
Creat: 1.5 mg/dl — ABNORMAL HIGH (ref 0.6–1.2)
Glucose, Bld: 88 mg/dL (ref 73–118)
Potassium: 3.9 mEq/L (ref 3.3–4.7)
Sodium: 140 mEq/L (ref 128–145)
Total Protein: 8.4 g/dL — ABNORMAL HIGH (ref 6.4–8.1)

## 2017-01-13 MED ORDER — HEPARIN SOD (PORK) LOCK FLUSH 100 UNIT/ML IV SOLN
500.0000 [IU] | Freq: Once | INTRAVENOUS | Status: AC | PRN
  Administered 2017-01-13: 500 [IU]
  Filled 2017-01-13: qty 5

## 2017-01-13 MED ORDER — SODIUM CHLORIDE 0.9 % IJ SOLN
10.0000 mL | INTRAMUSCULAR | Status: DC | PRN
  Administered 2017-01-13: 10 mL
  Filled 2017-01-13: qty 10

## 2017-01-13 NOTE — Patient Instructions (Signed)
Implanted Port Home Guide An implanted port is a type of central line that is placed under the skin. Central lines are used to provide IV access when treatment or nutrition needs to be given through a person's veins. Implanted ports are used for long-term IV access. An implanted port may be placed because:  You need IV medicine that would be irritating to the small veins in your hands or arms.  You need long-term IV medicines, such as antibiotics.  You need IV nutrition for a long period.  You need frequent blood draws for lab tests.  You need dialysis.  Implanted ports are usually placed in the chest area, but they can also be placed in the upper arm, the abdomen, or the leg. An implanted port has two main parts:  Reservoir. The reservoir is round and will appear as a small, raised area under your skin. The reservoir is the part where a needle is inserted to give medicines or draw blood.  Catheter. The catheter is a thin, flexible tube that extends from the reservoir. The catheter is placed into a large vein. Medicine that is inserted into the reservoir goes into the catheter and then into the vein.  How will I care for my incision site? Do not get the incision site wet. Bathe or shower as directed by your health care provider. How is my port accessed? Special steps must be taken to access the port:  Before the port is accessed, a numbing cream can be placed on the skin. This helps numb the skin over the port site.  Your health care provider uses a sterile technique to access the port. ? Your health care provider must put on a mask and sterile gloves. ? The skin over your port is cleaned carefully with an antiseptic and allowed to dry. ? The port is gently pinched between sterile gloves, and a needle is inserted into the port.  Only "non-coring" port needles should be used to access the port. Once the port is accessed, a blood return should be checked. This helps ensure that the port  is in the vein and is not clogged.  If your port needs to remain accessed for a constant infusion, a clear (transparent) bandage will be placed over the needle site. The bandage and needle will need to be changed every week, or as directed by your health care provider.  Keep the bandage covering the needle clean and dry. Do not get it wet. Follow your health care provider's instructions on how to take a shower or bath while the port is accessed.  If your port does not need to stay accessed, no bandage is needed over the port.  What is flushing? Flushing helps keep the port from getting clogged. Follow your health care provider's instructions on how and when to flush the port. Ports are usually flushed with saline solution or a medicine called heparin. The need for flushing will depend on how the port is used.  If the port is used for intermittent medicines or blood draws, the port will need to be flushed: ? After medicines have been given. ? After blood has been drawn. ? As part of routine maintenance.  If a constant infusion is running, the port may not need to be flushed.  How long will my port stay implanted? The port can stay in for as long as your health care provider thinks it is needed. When it is time for the port to come out, surgery will be   done to remove it. The procedure is similar to the one performed when the port was put in. When should I seek immediate medical care? When you have an implanted port, you should seek immediate medical care if:  You notice a bad smell coming from the incision site.  You have swelling, redness, or drainage at the incision site.  You have more swelling or pain at the port site or the surrounding area.  You have a fever that is not controlled with medicine.  This information is not intended to replace advice given to you by your health care provider. Make sure you discuss any questions you have with your health care provider. Document  Released: 04/26/2005 Document Revised: 10/02/2015 Document Reviewed: 01/01/2013 Elsevier Interactive Patient Education  2017 [employer]  

## 2017-01-13 NOTE — Progress Notes (Signed)
Hematology and Oncology Follow Up Visit  Barbara Cook [id number] 07/20/1955 61 y.o. 01/13/2017   Principle Diagnosis:  IgG kappa myeloma  Current Therapy:   Kyprolis q week (3/1) - d/c'ed on 11/01/2016 for toxicity   Interim History:  Barbara Cook is here today with Barbara Cook husband for follow-up. Barbara Cook comes in on crutches. Barbara Cook probably was exercising a couple weeks ago. Barbara Cook hurt Barbara Cook knee. Barbara Cook really is not saying how this happened. Per Barbara Cook has seen orthopedist. Barbara Cook was not happy with him. He wants to do an injection into the knee. Barbara Cook really did not want that to be done.  Barbara Cook myeloma studies, not surprising, have gone up a little bit. Barbara Cook last M spike was 1.7 g/dL. Barbara Cook IgG level was 2332 mg/dL. Barbara Cook Kappa light chain was 3.8 milligrams per deciliter.  Barbara Cook still has not made any decision regarding further therapy. I gave Barbara Cook information regarding Darzalex. I think this be a good idea for Barbara Cook. We now have the subcutaneous Darzalex that goes much more quickly.   Barbara Cook's had no fever. Barbara Cook's had no bleeding. Barbara Cook's had no change in bowel or bladder habits.   Barbara Cook appetite is doing pretty well. There is no nausea or vomiting.   Barbara Cook's had no cough.   Overall, Barbara Cook performance status is ECOG 1.   Medications:  Allergies as of 01/13/2017      Reactions   Codeine Palpitations      Medication List       Accurate as of 01/13/17 12:31 PM. Always use your most recent med list.          acetaminophen 500 MG tablet Commonly known as:  TYLENOL Take 500 mg by mouth every 6 (six) hours as needed for mild pain or headache.   ALIGN 4 MG Caps Take 1 capsule by mouth daily.   doxycycline 100 MG tablet Commonly known as:  VIBRA-TABS Take 1 tablet (100 mg total) by mouth 2 (two) times daily.   hyoscyamine 0.125 MG SL tablet Commonly known as:  LEVSIN SL Place 1 tablet (0.125 mg total) under the tongue every 4 (four) hours as needed.   KLOR-CON 8 MEQ tablet Generic drug:  potassium chloride 1 TABLET  ONCE A DAY ORALLY 30 DAY(S)   magnesium oxide 400 (241.3 Mg) MG tablet Commonly known as:  MAG-OX TAKE 1 TABLET BY MOUTH TWICE A DAY   multivitamin tablet Take 1 tablet by mouth every evening.   mupirocin cream 2 % Commonly known as:  BACTROBAN Apply 1 application topically 2 (two) times daily.   ondansetron 8 MG tablet Commonly known as:  ZOFRAN Take 1 tablet (8 mg total) by mouth 2 (two) times daily. For nausea & vomiting. Take 1 tablet 1 hour prior to chemotherapy.   pantoprazole 40 MG tablet Commonly known as:  PROTONIX Take 1 tablet (40 mg total) by mouth daily.   polyethylene glycol powder powder Commonly known as:  GLYCOLAX/MIRALAX 1 capful daily as needed   pomalidomide 3 MG capsule Commonly known as:  POMALYST Take 1 capsule (3 mg total) by mouth daily. Take with water on days 1-21. Repeat every 28 days.   spironolactone 50 MG tablet Commonly known as:  ALDACTONE Take 50 mg by mouth daily.   torsemide 20 MG tablet Commonly known as:  DEMADEX Take 1 tablet (20 mg total) by mouth daily as needed.   vitamin B-6 250 MG tablet Take 1 tablet (250 mg total) by mouth daily.   Vitamin D (Ergocalciferol)  50000 units Caps capsule Commonly known as:  DRISDOL TAKE 1 CAPSULE (50,000 UNITS TOTAL) BY MOUTH ONCE A WEEK.            Discharge Care Instructions        Start     Ordered   01/13/17 0000  MR KNEE LEFT W WO CONTRAST    Question Answer Comment  If indicated for the ordered procedure, I authorize the administration of contrast media per Radiology protocol Yes   What is the patient's sedation requirement? No Sedation   Does the patient have a pacemaker or implanted devices? No   Radiology Contrast Protocol - do NOT remove file path [email or web address]   Reason for Exam additional comments Swollen and very painful left knee. Barbara Cook hurt this while exercising. Barbara Cook has myeloma.   Preferred imaging location? [facility] (table  limit-350 lbs)      01/13/17 1230   01/13/17 0000  CBC with Differential ([facility])     01/13/17 1230   01/13/17 0000  CMP STAT ([place] only)     01/13/17 1230   01/13/17 0000  IgG, IgA, IgM     01/13/17 1230   01/13/17 0000  Kappa/lambda light chains     01/13/17 1230   01/13/17 0000  Serum protein electrophoresis with reflex     01/13/17 1230      Allergies:  Allergies  Allergen Reactions  . Codeine Palpitations    Past Medical History, Surgical history, Social history, and Family History were reviewed and updated.  Review of Systems: All other 10 point review of systems is negative.   Physical Exam:  weight is 172 lb 8 oz (78.2 kg). Barbara Cook oral temperature is 98.1 F (36.7 C). Barbara Cook blood pressure is 127/60 and Barbara Cook pulse is 78.   Wt Readings from Last 3 Encounters:  01/13/17 172 lb 8 oz (78.2 kg)  12/24/16 168 lb 3.2 oz (76.3 kg)  12/20/16 171 lb (77.6 kg)    Physical Exam  Constitutional: Barbara Cook is oriented to person, place, and time.  HENT:  Head: Normocephalic and atraumatic.  Mouth/Throat: Oropharynx is clear and moist.  Eyes: Pupils are equal, round, and reactive to light. EOM are normal.  Neck: Normal range of motion.  Cardiovascular: Normal rate, regular rhythm and normal heart sounds.   Pulmonary/Chest: Effort normal and breath sounds normal.  Abdominal: Soft. Bowel sounds are normal.  Musculoskeletal: Normal range of motion. Barbara Cook exhibits no edema, tenderness or deformity.  Barbara Cook left knee is swollen. It is tender with rotation. It is tender with palpation laterally and medially. Barbara Cook has limited range of motion of the left knee.  Lymphadenopathy:    Barbara Cook has no cervical adenopathy.  Neurological: Barbara Cook is alert and oriented to person, place, and time.  Skin: Skin is warm and dry. No rash noted. No erythema.  Psychiatric: Barbara Cook has a normal mood and affect. Barbara Cook behavior is normal. Judgment and thought content normal.  Vitals  reviewed.    Lab Results  Component Value Date   WBC 3.4 (L) 01/13/2017   HGB 10.0 (L) 01/13/2017   HCT 29.8 (L) 01/13/2017   MCV 93 01/13/2017   PLT 146 01/13/2017   Lab Results  Component Value Date   FERRITIN 102 02/20/2016   IRON 81 02/20/2016   TIBC 316 02/20/2016   UIBC 235 02/20/2016   IRONPCTSAT 26 02/20/2016   Lab Results  Component Value Date   RETICCTPCT 1.1 12/25/2014   RBC 3.21 (L)  01/13/2017   RETICCTABS 35.9 12/25/2014   Lab Results  Component Value Date   KPAFRELGTCHN 7.11 (H) 05/07/2015   LAMBDASER 1.65 05/07/2015   KAPLAMBRATIO 4.48 (H) 12/20/2016   Lab Results  Component Value Date   IGGSERUM 2,332 (H) 12/20/2016   IGA 116 05/07/2015   IGMSERUM 17 (L) 12/20/2016   Lab Results  Component Value Date   TOTALPROTELP 7.8 05/07/2015   ALBUMINELP 4.2 05/07/2015   A1GS 0.3 05/07/2015   A2GS 0.8 05/07/2015   BETS 0.4 05/07/2015   BETA2SER 0.3 05/07/2015   GAMS 1.9 (H) 05/07/2015   MSPIKE 1.7 (H) 12/20/2016   SPEI * 05/07/2015     Chemistry      Component Value Date/Time   NA 140 01/13/2017 1123   NA 139 02/20/2016 1207   K 3.9 01/13/2017 1123   K 3.8 02/20/2016 1207   CL 106 01/13/2017 1123   CO2 29 01/13/2017 1123   CO2 29 02/20/2016 1207   BUN 21 01/13/2017 1123   BUN 22.3 02/20/2016 1207   CREATININE 1.5 (H) 01/13/2017 1123   CREATININE 1.3 (H) 02/20/2016 1207      Component Value Date/Time   CALCIUM 9.6 01/13/2017 1123   CALCIUM 9.7 02/20/2016 1207   ALKPHOS 44 01/13/2017 1123   ALKPHOS 62 02/20/2016 1207   AST 25 01/13/2017 1123   AST 27 02/20/2016 1207   ALT 22 01/13/2017 1123   ALT 19 02/20/2016 1207   BILITOT 0.70 01/13/2017 1123   BILITOT 0.44 02/20/2016 1207      Impression and Plan: Barbara Cook is a very pleasant 61 yo African American female with IgG myeloma.   Barbara Cook still has not decided about treatment. With Barbara Cook protein minimally elevating from last visit, I think we are okay. I don't think Barbara Cook M spike will really  be up that much.  I'm more worried about Barbara Cook left knee. It is clearly swollen and very painful. I suspect Barbara Cook may have torn a meniscus.  I'll order an MRI of the left knee. Hopefully this will be done tomorrow.  We will make the referral to Dr. Levora Dredge. I called their office. I gave them Barbara Cook's information. I told the office to have Dr. Rhona Raider call me.   Not surprisingly, with Barbara Cook, there always is some issue and I sometimes just have to stand back a little bit and let the "dust settle."   I spent about 35 minutes with Barbara Cook and Barbara Cook husband. I really have to take the "bull by the horns" with Barbara Cook knee. Barbara Cook was not happy at all with the orthopedist that Barbara Cook had seen previously. Barbara Cook will not go back to see him.   [name], MD 9/6/201812:31 PM

## 2017-01-14 LAB — IGG, IGA, IGM
IGA/IMMUNOGLOBULIN A, SERUM: 60 mg/dL — AB (ref 87–352)
IGM (IMMUNOGLOBIN M), SRM: 17 mg/dL — AB (ref 26–217)
IgG, Qn, Serum: 3294 mg/dL — ABNORMAL HIGH (ref 700–1600)

## 2017-01-14 LAB — KAPPA/LAMBDA LIGHT CHAINS
IG KAPPA FREE LIGHT CHAIN: 72.3 mg/L — AB (ref 3.3–19.4)
Ig Lambda Free Light Chain: 8.4 mg/L (ref 5.7–26.3)
KAPPA/LAMBDA FLC RATIO: 8.61 — AB (ref 0.26–1.65)

## 2017-01-18 LAB — PROTEIN ELECTROPHORESIS, SERUM, WITH REFLEX
A/G RATIO SPE: 0.9 (ref 0.7–1.7)
ALBUMIN: 3.7 g/dL (ref 2.9–4.4)
Alpha 1: 0.2 g/dL (ref 0.0–0.4)
Alpha 2: 0.4 g/dL (ref 0.4–1.0)
Beta: 0.9 g/dL (ref 0.7–1.3)
GAMMA GLOBULIN: 2.7 g/dL — AB (ref 0.4–1.8)
GLOBULIN, TOTAL: 4.3 g/dL — AB (ref 2.2–3.9)
Interpretation(See Below): 0
M-Spike, %: 2.3 g/dL — ABNORMAL HIGH
TOTAL PROTEIN: 8 g/dL (ref 6.0–8.5)

## 2017-01-19 ENCOUNTER — Ambulatory Visit (HOSPITAL_COMMUNITY)
Admission: RE | Admit: 2017-01-19 | Discharge: 2017-01-19 | Disposition: A | Discharge status: Home / Self Care | Payer: 59 | Source: Ambulatory Visit | Attending: Hematology & Oncology | Admitting: Hematology & Oncology

## 2017-01-19 ENCOUNTER — Other Ambulatory Visit: Payer: Self-pay | Admitting: Hematology & Oncology

## 2017-01-19 DIAGNOSIS — M25562 Pain in left knee: Secondary | ICD-10-CM

## 2017-01-19 DIAGNOSIS — M25462 Effusion, left knee: Secondary | ICD-10-CM

## 2017-01-21 DIAGNOSIS — M222X2 Patellofemoral disorders, left knee: Secondary | ICD-10-CM | POA: Diagnosis not present

## 2017-02-03 ENCOUNTER — Ambulatory Visit: Payer: 59 | Admitting: Hematology & Oncology

## 2017-02-03 ENCOUNTER — Other Ambulatory Visit: Payer: 59

## 2017-02-07 ENCOUNTER — Other Ambulatory Visit: Payer: Self-pay | Admitting: Hematology & Oncology

## 2017-02-07 DIAGNOSIS — C9000 Multiple myeloma not having achieved remission: Secondary | ICD-10-CM

## 2017-02-15 ENCOUNTER — Other Ambulatory Visit: Payer: Self-pay | Admitting: Family

## 2017-02-15 ENCOUNTER — Encounter: Payer: Self-pay | Admitting: Hematology & Oncology

## 2017-02-17 ENCOUNTER — Ambulatory Visit (HOSPITAL_BASED_OUTPATIENT_CLINIC_OR_DEPARTMENT_OTHER): Payer: 59

## 2017-02-17 ENCOUNTER — Ambulatory Visit (HOSPITAL_BASED_OUTPATIENT_CLINIC_OR_DEPARTMENT_OTHER): Payer: 59 | Admitting: Hematology & Oncology

## 2017-02-17 ENCOUNTER — Other Ambulatory Visit (HOSPITAL_BASED_OUTPATIENT_CLINIC_OR_DEPARTMENT_OTHER): Payer: 59

## 2017-02-17 VITALS — BP 126/63 | HR 78 | Temp 97.9°F | Resp 17

## 2017-02-17 DIAGNOSIS — C9000 Multiple myeloma not having achieved remission: Secondary | ICD-10-CM

## 2017-02-17 DIAGNOSIS — M25562 Pain in left knee: Secondary | ICD-10-CM

## 2017-02-17 DIAGNOSIS — M25462 Effusion, left knee: Secondary | ICD-10-CM

## 2017-02-17 DIAGNOSIS — M899 Disorder of bone, unspecified: Secondary | ICD-10-CM

## 2017-02-17 LAB — CMP (CANCER CENTER ONLY)
ALT(SGPT): 30 U/L (ref 10–47)
AST: 37 U/L (ref 11–38)
Albumin: 3.2 g/dL — ABNORMAL LOW (ref 3.3–5.5)
Alkaline Phosphatase: 36 U/L (ref 26–84)
BUN: 20 mg/dL (ref 7–22)
CHLORIDE: 109 meq/L — AB (ref 98–108)
CO2: 27 meq/L (ref 18–33)
CREATININE: 1.4 mg/dL — AB (ref 0.6–1.2)
Calcium: 10.3 mg/dL (ref 8.0–10.3)
Glucose, Bld: 91 mg/dL (ref 73–118)
Potassium: 4 mEq/L (ref 3.3–4.7)
SODIUM: 144 meq/L (ref 128–145)
Total Bilirubin: 0.6 mg/dl (ref 0.20–1.60)
Total Protein: 10.8 g/dL — ABNORMAL HIGH (ref 6.4–8.1)

## 2017-02-17 LAB — CBC WITH DIFFERENTIAL (CANCER CENTER ONLY)
BASO#: 0 10*3/uL (ref 0.0–0.2)
BASO%: 0.3 % (ref 0.0–2.0)
EOS ABS: 0 10*3/uL (ref 0.0–0.5)
EOS%: 1.3 % (ref 0.0–7.0)
HCT: 29 % — ABNORMAL LOW (ref 34.8–46.6)
HGB: 9.8 g/dL — ABNORMAL LOW (ref 11.6–15.9)
LYMPH#: 1.2 10*3/uL (ref 0.9–3.3)
LYMPH%: 40.3 % (ref 14.0–48.0)
MCH: 30.7 pg (ref 26.0–34.0)
MCHC: 33.8 g/dL (ref 32.0–36.0)
MCV: 91 fL (ref 81–101)
MONO#: 0.5 10*3/uL (ref 0.1–0.9)
MONO%: 15.2 % — AB (ref 0.0–13.0)
NEUT#: 1.3 10*3/uL — ABNORMAL LOW (ref 1.5–6.5)
NEUT%: 42.9 % (ref 39.6–80.0)
PLATELETS: 125 10*3/uL — AB (ref 145–400)
RBC: 3.19 10*6/uL — ABNORMAL LOW (ref 3.70–5.32)
RDW: 13.2 % (ref 11.1–15.7)
WBC: 3 10*3/uL — ABNORMAL LOW (ref 3.9–10.0)

## 2017-02-17 MED ORDER — HEPARIN SOD (PORK) LOCK FLUSH 100 UNIT/ML IV SOLN
500.0000 [IU] | Freq: Once | INTRAVENOUS | Status: AC | PRN
  Administered 2017-02-17: 500 [IU]
  Filled 2017-02-17: qty 5

## 2017-02-17 MED ORDER — MONTELUKAST SODIUM 10 MG PO TABS: ORAL_TABLET | ORAL | 3 refills | Status: DC

## 2017-02-17 MED ORDER — SODIUM CHLORIDE 0.9 % IJ SOLN
10.0000 mL | INTRAMUSCULAR | Status: DC | PRN
  Administered 2017-02-17: 10 mL
  Filled 2017-02-17: qty 10

## 2017-02-17 NOTE — Progress Notes (Signed)
Hematology and Oncology Follow Up Visit  Barbara Cook [id number] July 12, 1955 61 y.o. 02/17/2017   Principle Diagnosis:  IgG kappa myeloma  Current Therapy:   Kyprolis q week (3/1) - d/c'ed on 11/01/2016 for toxicity Daratumumab q weekly therapy - start on 02/22/2017   Interim History:  Ms. Calix is here today with her husband for follow-up. She actually is doing a little bit better. She saw Dr. Rhona Raider of orthopedic surgery for her left knee. He drinks some fluid. He gave her a steroid injection. This made the knee feel a lot better.  I think it is apparent that her myeloma is getting worse. We saw her last, her M spike was up to 2.3 g/dL. Her IgG level was 3300 mg/dL. Her  Kappa light chain was 7.3 mg/dL.  I'm sure that today, the values are higher.  She has resisted quite a bit to take any therapy. We did try her on Kyprolis which she had a very hard time with.  I told her that I thought that she would do okay with Daratumumab. I think she would tolerate this. I talked to she and her husband about this. I talked to them reversely regarding this.  Her appetite is doing well. She's had no nausea or vomiting. She's had no cough. This been no bleeding or bruising. She's had no change in bowel or bladder habits.   Overall, her performance status is ECOG 1.   Medications:  Allergies as of 02/17/2017      Reactions   Codeine Palpitations      Medication List       Accurate as of 02/17/17  1:03 PM. Always use your most recent med list.          acetaminophen 500 MG tablet Commonly known as:  TYLENOL Take 500 mg by mouth every 6 (six) hours as needed for mild pain or headache.   ALIGN 4 MG Caps Take 1 capsule by mouth daily.   doxycycline 100 MG tablet Commonly known as:  VIBRA-TABS Take 1 tablet (100 mg total) by mouth 2 (two) times daily.   hyoscyamine 0.125 MG SL tablet Commonly known as:  LEVSIN SL Place 1 tablet (0.125 mg total) under the tongue every 4  (four) hours as needed.   KLOR-CON 8 MEQ tablet Generic drug:  potassium chloride 1 TABLET ONCE A DAY ORALLY 30 DAY(S)   magnesium oxide 400 (241.3 Mg) MG tablet Commonly known as:  MAG-OX TAKE 1 TABLET BY MOUTH TWICE A DAY   multivitamin tablet Take 1 tablet by mouth every evening.   mupirocin cream 2 % Commonly known as:  BACTROBAN Apply 1 application topically 2 (two) times daily.   ondansetron 8 MG tablet Commonly known as:  ZOFRAN Take 1 tablet (8 mg total) by mouth 2 (two) times daily. For nausea & vomiting. Take 1 tablet 1 hour prior to chemotherapy.   pantoprazole 40 MG tablet Commonly known as:  PROTONIX Take 1 tablet (40 mg total) by mouth daily.   polyethylene glycol powder powder Commonly known as:  GLYCOLAX/MIRALAX 1 capful daily as needed   pomalidomide 3 MG capsule Commonly known as:  POMALYST Take 1 capsule (3 mg total) by mouth daily. Take with water on days 1-21. Repeat every 28 days.   torsemide 20 MG tablet Commonly known as:  DEMADEX Take 1 tablet (20 mg total) by mouth daily as needed.   vitamin B-6 250 MG tablet Take 1 tablet (250 mg total) by mouth daily.  Vitamin D (Ergocalciferol) 50000 units Caps capsule Commonly known as:  DRISDOL TAKE 1 CAPSULE (50,000 UNITS TOTAL) BY MOUTH ONCE A WEEK.       Allergies:  Allergies  Allergen Reactions  . Codeine Palpitations    Past Medical History, Surgical history, Social history, and Family History were reviewed and updated.  Review of Systems: As stated in the interim history   Physical Exam:  vitals were not taken for this visit.  Wt Readings from Last 3 Encounters:  01/13/17 172 lb 8 oz (78.2 kg)  12/24/16 168 lb 3.2 oz (76.3 kg)  12/20/16 171 lb (77.6 kg)    Physical Exam  Constitutional: She is oriented to person, place, and time.  HENT:  Head: Normocephalic and atraumatic.  Mouth/Throat: Oropharynx is clear and moist.  Eyes: Pupils are equal, round, and reactive to light.  EOM are normal.  Neck: Normal range of motion.  Cardiovascular: Normal rate, regular rhythm and normal heart sounds.   Pulmonary/Chest: Effort normal and breath sounds normal.  Abdominal: Soft. Bowel sounds are normal.  Musculoskeletal: Normal range of motion. She exhibits no edema, tenderness or deformity.  Her left knee is swollen. It is tender with rotation. It is tender with palpation laterally and medially. She has limited range of motion of the left knee.  Lymphadenopathy:    She has no cervical adenopathy.  Neurological: She is alert and oriented to person, place, and time.  Skin: Skin is warm and dry. No rash noted. No erythema.  Psychiatric: She has a normal mood and affect. Her behavior is normal. Judgment and thought content normal.  Vitals reviewed.    Lab Results  Component Value Date   WBC 3.0 (L) 02/17/2017   HGB 9.8 (L) 02/17/2017   HCT 29.0 (L) 02/17/2017   MCV 91 02/17/2017   PLT 125 (L) 02/17/2017   Lab Results  Component Value Date   FERRITIN 102 02/20/2016   IRON 81 02/20/2016   TIBC 316 02/20/2016   UIBC 235 02/20/2016   IRONPCTSAT 26 02/20/2016   Lab Results  Component Value Date   RETICCTPCT 1.1 12/25/2014   RBC 3.19 (L) 02/17/2017   RETICCTABS 35.9 12/25/2014   Lab Results  Component Value Date   KPAFRELGTCHN 7.11 (H) 05/07/2015   LAMBDASER 1.65 05/07/2015   KAPLAMBRATIO 8.61 (H) 01/13/2017   Lab Results  Component Value Date   IGGSERUM 3,294 (H) 01/13/2017   IGA 116 05/07/2015   IGMSERUM 17 (L) 01/13/2017   Lab Results  Component Value Date   TOTALPROTELP 7.8 05/07/2015   ALBUMINELP 4.2 05/07/2015   A1GS 0.3 05/07/2015   A2GS 0.8 05/07/2015   BETS 0.4 05/07/2015   BETA2SER 0.3 05/07/2015   GAMS 1.9 (H) 05/07/2015   MSPIKE 2.3 (H) 01/13/2017   SPEI * 05/07/2015     Chemistry      Component Value Date/Time   NA 140 01/13/2017 1123   NA 139 02/20/2016 1207   K 3.9 01/13/2017 1123   K 3.8 02/20/2016 1207   CL 106 01/13/2017  1123   CO2 29 01/13/2017 1123   CO2 29 02/20/2016 1207   BUN 21 01/13/2017 1123   BUN 22.3 02/20/2016 1207   CREATININE 1.5 (H) 01/13/2017 1123   CREATININE 1.3 (H) 02/20/2016 1207      Component Value Date/Time   CALCIUM 9.6 01/13/2017 1123   CALCIUM 9.7 02/20/2016 1207   ALKPHOS 44 01/13/2017 1123   ALKPHOS 62 02/20/2016 1207   AST 25 01/13/2017 1123  AST 27 02/20/2016 1207   ALT 22 01/13/2017 1123   ALT 19 02/20/2016 1207   BILITOT 0.70 01/13/2017 1123   BILITOT 0.44 02/20/2016 1207      Impression and Plan: Ms. Gabrielsen is a very pleasant 61 yo African American female with IgG myeloma.   She has not migrated to take treatment. Again, I think this is the right idea. I really believe that the Daratumumab can help her.  She or he has the Port-A-Cath in.  I explained again how Daratumumab works. I told her about the possibility of an infusion reaction. I gave her a prescription for Singulair (10 mg by mouth daily) to take for 5 days prior to her first treatment. She will then take Singulair for 2 days prior to her subsequent treatments.  I spent about 45 minutes with she and her husband. As always, it is been a very tough time for Ms.Inks to decide about treatment.   We will try to get started next week.  I will then plan to see her back in about 2 or 3 weeks.   [name], MD 10/11/20181:03 PM

## 2017-02-17 NOTE — Patient Instructions (Signed)
Implanted Port Home Guide An implanted port is a type of central line that is placed under the skin. Central lines are used to provide IV access when treatment or nutrition needs to be given through a person's veins. Implanted ports are used for long-term IV access. An implanted port may be placed because:  You need IV medicine that would be irritating to the small veins in your hands or arms.  You need long-term IV medicines, such as antibiotics.  You need IV nutrition for a long period.  You need frequent blood draws for lab tests.  You need dialysis.  Implanted ports are usually placed in the chest area, but they can also be placed in the upper arm, the abdomen, or the leg. An implanted port has two main parts:  Reservoir. The reservoir is round and will appear as a small, raised area under your skin. The reservoir is the part where a needle is inserted to give medicines or draw blood.  Catheter. The catheter is a thin, flexible tube that extends from the reservoir. The catheter is placed into a large vein. Medicine that is inserted into the reservoir goes into the catheter and then into the vein.  How will I care for my incision site? Do not get the incision site wet. Bathe or shower as directed by your health care provider. How is my port accessed? Special steps must be taken to access the port:  Before the port is accessed, a numbing cream can be placed on the skin. This helps numb the skin over the port site.  Your health care provider uses a sterile technique to access the port. ? Your health care provider must put on a mask and sterile gloves. ? The skin over your port is cleaned carefully with an antiseptic and allowed to dry. ? The port is gently pinched between sterile gloves, and a needle is inserted into the port.  Only "non-coring" port needles should be used to access the port. Once the port is accessed, a blood return should be checked. This helps ensure that the port  is in the vein and is not clogged.  If your port needs to remain accessed for a constant infusion, a clear (transparent) bandage will be placed over the needle site. The bandage and needle will need to be changed every week, or as directed by your health care provider.  Keep the bandage covering the needle clean and dry. Do not get it wet. Follow your health care provider's instructions on how to take a shower or bath while the port is accessed.  If your port does not need to stay accessed, no bandage is needed over the port.  What is flushing? Flushing helps keep the port from getting clogged. Follow your health care provider's instructions on how and when to flush the port. Ports are usually flushed with saline solution or a medicine called heparin. The need for flushing will depend on how the port is used.  If the port is used for intermittent medicines or blood draws, the port will need to be flushed: ? After medicines have been given. ? After blood has been drawn. ? As part of routine maintenance.  If a constant infusion is running, the port may not need to be flushed.  How long will my port stay implanted? The port can stay in for as long as your health care provider thinks it is needed. When it is time for the port to come out, surgery will be   done to remove it. The procedure is similar to the one performed when the port was put in. When should I seek immediate medical care? When you have an implanted port, you should seek immediate medical care if:  You notice a bad smell coming from the incision site.  You have swelling, redness, or drainage at the incision site.  You have more swelling or pain at the port site or the surrounding area.  You have a fever that is not controlled with medicine.  This information is not intended to replace advice given to you by your health care provider. Make sure you discuss any questions you have with your health care provider. Document  Released: 04/26/2005 Document Revised: 10/02/2015 Document Reviewed: 01/01/2013 Elsevier Interactive Patient Education  2017 [employer]  

## 2017-02-18 DIAGNOSIS — M222X2 Patellofemoral disorders, left knee: Secondary | ICD-10-CM | POA: Diagnosis not present

## 2017-02-18 LAB — IGG, IGA, IGM
IGA/IMMUNOGLOBULIN A, SERUM: 33 mg/dL — AB (ref 87–352)
IGM (IMMUNOGLOBIN M), SRM: 13 mg/dL — AB (ref 26–217)
IgG, Qn, Serum: 5814 mg/dL — ABNORMAL HIGH (ref 700–1600)

## 2017-02-19 LAB — KAPPA/LAMBDA LIGHT CHAINS
Ig Kappa Free Light Chain: 200.9 mg/L — ABNORMAL HIGH (ref 3.3–19.4)
Ig Lambda Free Light Chain: 7 mg/L (ref 5.7–26.3)
KAPPA/LAMBDA FLC RATIO: 28.7 — AB (ref 0.26–1.65)

## 2017-02-21 ENCOUNTER — Other Ambulatory Visit: Payer: Self-pay | Admitting: *Deleted

## 2017-02-21 DIAGNOSIS — C9000 Multiple myeloma not having achieved remission: Secondary | ICD-10-CM

## 2017-02-22 DIAGNOSIS — M25662 Stiffness of left knee, not elsewhere classified: Secondary | ICD-10-CM | POA: Diagnosis not present

## 2017-02-22 DIAGNOSIS — M25562 Pain in left knee: Secondary | ICD-10-CM | POA: Diagnosis not present

## 2017-02-23 ENCOUNTER — Other Ambulatory Visit: Payer: Self-pay | Admitting: *Deleted

## 2017-02-23 DIAGNOSIS — C9000 Multiple myeloma not having achieved remission: Secondary | ICD-10-CM

## 2017-02-23 LAB — PROTEIN ELECTROPHORESIS, SERUM, WITH REFLEX
A/G RATIO SPE: 0.6 — AB (ref 0.7–1.7)
ALBUMIN: 3.7 g/dL (ref 2.9–4.4)
ALPHA 2: 0.5 g/dL (ref 0.4–1.0)
Alpha 1: 0.2 g/dL (ref 0.0–0.4)
Beta: 0.9 g/dL (ref 0.7–1.3)
GLOBULIN, TOTAL: 6.1 g/dL — AB (ref 2.2–3.9)
Gamma Globulin: 4.5 g/dL — ABNORMAL HIGH (ref 0.4–1.8)
INTERPRETATION(SEE BELOW): 0
M-Spike, %: 4.2 g/dL — ABNORMAL HIGH
TOTAL PROTEIN: 9.8 g/dL — AB (ref 6.0–8.5)

## 2017-02-24 ENCOUNTER — Other Ambulatory Visit: Payer: 59

## 2017-02-24 ENCOUNTER — Other Ambulatory Visit: Payer: Self-pay | Admitting: Family

## 2017-02-24 ENCOUNTER — Other Ambulatory Visit: Payer: Self-pay | Admitting: Hematology & Oncology

## 2017-02-24 ENCOUNTER — Ambulatory Visit (HOSPITAL_BASED_OUTPATIENT_CLINIC_OR_DEPARTMENT_OTHER): Payer: 59

## 2017-02-24 ENCOUNTER — Ambulatory Visit (HOSPITAL_COMMUNITY)
Admission: RE | Admit: 2017-02-24 | Discharge: 2017-02-24 | Disposition: A | Discharge status: Home / Self Care | Payer: 59 | Source: Ambulatory Visit | Attending: Hematology & Oncology | Admitting: Hematology & Oncology

## 2017-02-24 ENCOUNTER — Other Ambulatory Visit (HOSPITAL_BASED_OUTPATIENT_CLINIC_OR_DEPARTMENT_OTHER): Payer: 59

## 2017-02-24 ENCOUNTER — Ambulatory Visit: Payer: 59

## 2017-02-24 VITALS — BP 123/61 | HR 76 | Temp 97.8°F | Resp 18

## 2017-02-24 DIAGNOSIS — Z5112 Encounter for antineoplastic immunotherapy: Secondary | ICD-10-CM

## 2017-02-24 DIAGNOSIS — C9000 Multiple myeloma not having achieved remission: Secondary | ICD-10-CM | POA: Diagnosis not present

## 2017-02-24 LAB — TYPE AND SCREEN
ABO/RH(D): A POS
Antibody Screen: NEGATIVE

## 2017-02-24 LAB — CMP (CANCER CENTER ONLY)
ALT(SGPT): 33 U/L (ref 10–47)
AST: 46 U/L — ABNORMAL HIGH (ref 11–38)
Albumin: 2.8 g/dL — ABNORMAL LOW (ref 3.3–5.5)
Alkaline Phosphatase: 41 U/L (ref 26–84)
BUN: 22 mg/dL (ref 7–22)
CHLORIDE: 104 meq/L (ref 98–108)
CO2: 25 mEq/L (ref 18–33)
Calcium: 10.1 mg/dL (ref 8.0–10.3)
Creat: 1.2 mg/dl (ref 0.6–1.2)
Glucose, Bld: 106 mg/dL (ref 73–118)
POTASSIUM: 3.7 meq/L (ref 3.3–4.7)
Sodium: 139 mEq/L (ref 128–145)
TOTAL PROTEIN: 11 g/dL — AB (ref 6.4–8.1)
Total Bilirubin: 0.5 mg/dl (ref 0.20–1.60)

## 2017-02-24 LAB — CBC WITH DIFFERENTIAL (CANCER CENTER ONLY)
BASO#: 0 10*3/uL (ref 0.0–0.2)
BASO%: 0.3 % (ref 0.0–2.0)
EOS ABS: 0 10*3/uL (ref 0.0–0.5)
EOS%: 1.2 % (ref 0.0–7.0)
HCT: 28.1 % — ABNORMAL LOW (ref 34.8–46.6)
HGB: 9.4 g/dL — ABNORMAL LOW (ref 11.6–15.9)
LYMPH#: 1.4 10*3/uL (ref 0.9–3.3)
LYMPH%: 40.4 % (ref 14.0–48.0)
MCH: 30.5 pg (ref 26.0–34.0)
MCHC: 33.5 g/dL (ref 32.0–36.0)
MCV: 91 fL (ref 81–101)
MONO#: 0.5 10*3/uL (ref 0.1–0.9)
MONO%: 13.8 % — ABNORMAL HIGH (ref 0.0–13.0)
NEUT#: 1.5 10*3/uL (ref 1.5–6.5)
NEUT%: 44.3 % (ref 39.6–80.0)
PLATELETS: 101 10*3/uL — AB (ref 145–400)
RBC: 3.08 10*6/uL — AB (ref 3.70–5.32)
RDW: 13.3 % (ref 11.1–15.7)
WBC: 3.3 10*3/uL — ABNORMAL LOW (ref 3.9–10.0)

## 2017-02-24 LAB — PRETREATMENT RBC PHENOTYPE

## 2017-02-24 MED ORDER — SODIUM CHLORIDE 0.9% FLUSH
10.0000 mL | INTRAVENOUS | Status: DC | PRN
  Administered 2017-02-24: 10 mL
  Filled 2017-02-24: qty 10

## 2017-02-24 MED ORDER — METHYLPREDNISOLONE SODIUM SUCC 125 MG IJ SOLR
INTRAMUSCULAR | Status: AC
  Filled 2017-02-24: qty 2

## 2017-02-24 MED ORDER — LORAZEPAM 2 MG/ML IJ SOLN
INTRAMUSCULAR | Status: AC
  Filled 2017-02-24: qty 1

## 2017-02-24 MED ORDER — PROCHLORPERAZINE MALEATE 10 MG PO TABS
10.0000 mg | ORAL_TABLET | Freq: Once | ORAL | Status: AC
  Administered 2017-02-24: 10 mg via ORAL

## 2017-02-24 MED ORDER — SODIUM CHLORIDE 0.9 % IV SOLN
15.5000 mg/kg | Freq: Once | INTRAVENOUS | Status: AC
  Administered 2017-02-24: 1200 mg via INTRAVENOUS
  Filled 2017-02-24: qty 60

## 2017-02-24 MED ORDER — ACETAMINOPHEN 325 MG PO TABS
ORAL_TABLET | ORAL | Status: AC
  Filled 2017-02-24: qty 2

## 2017-02-24 MED ORDER — DIPHENHYDRAMINE HCL 25 MG PO CAPS
ORAL_CAPSULE | ORAL | Status: AC
  Filled 2017-02-24: qty 2

## 2017-02-24 MED ORDER — DIPHENHYDRAMINE HCL 25 MG PO CAPS
50.0000 mg | ORAL_CAPSULE | Freq: Once | ORAL | Status: AC
  Administered 2017-02-24: 50 mg via ORAL

## 2017-02-24 MED ORDER — SODIUM CHLORIDE 0.9 % IV SOLN
Freq: Once | INTRAVENOUS | Status: AC
  Administered 2017-02-24: 09:00:00 via INTRAVENOUS

## 2017-02-24 MED ORDER — FAMOTIDINE IN NACL 20-0.9 MG/50ML-% IV SOLN
INTRAVENOUS | Status: AC
  Filled 2017-02-24: qty 50

## 2017-02-24 MED ORDER — LORAZEPAM 2 MG/ML IJ SOLN
0.5000 mg | Freq: Once | INTRAMUSCULAR | Status: AC
  Administered 2017-02-24: 0.5 mg via INTRAVENOUS

## 2017-02-24 MED ORDER — HEPARIN SOD (PORK) LOCK FLUSH 100 UNIT/ML IV SOLN
500.0000 [IU] | Freq: Once | INTRAVENOUS | Status: AC | PRN
  Administered 2017-02-24: 500 [IU]
  Filled 2017-02-24: qty 5

## 2017-02-24 MED ORDER — FAMOTIDINE IN NACL 20-0.9 MG/50ML-% IV SOLN
40.0000 mg | Freq: Once | INTRAVENOUS | Status: AC
  Administered 2017-02-24: 40 mg via INTRAVENOUS

## 2017-02-24 MED ORDER — METHYLPREDNISOLONE SODIUM SUCC 125 MG IJ SOLR
125.0000 mg | Freq: Once | INTRAMUSCULAR | Status: AC
  Administered 2017-02-24: 125 mg via INTRAVENOUS

## 2017-02-24 MED ORDER — ACETAMINOPHEN 325 MG PO TABS
650.0000 mg | ORAL_TABLET | Freq: Once | ORAL | Status: AC
  Administered 2017-02-24: 650 mg via ORAL

## 2017-02-24 MED ORDER — PROCHLORPERAZINE MALEATE 10 MG PO TABS
ORAL_TABLET | ORAL | Status: AC
  Filled 2017-02-24: qty 1

## 2017-02-24 NOTE — Patient Instructions (Addendum)
[facility] Discharge Instructions for Patients Receiving Chemotherapy  Today you received the following chemotherapy agents Darzalex  To help prevent nausea and vomiting after your treatment, we encourage you to take your nausea medication as prescribed.    If you develop nausea and vomiting that is not controlled by your nausea medication, call the clinic.   BELOW ARE SYMPTOMS THAT SHOULD BE REPORTED IMMEDIATELY:  *FEVER GREATER THAN 100.5 F  *CHILLS WITH OR WITHOUT FEVER  NAUSEA AND VOMITING THAT IS NOT CONTROLLED WITH YOUR NAUSEA MEDICATION  *UNUSUAL SHORTNESS OF BREATH  *UNUSUAL BRUISING OR BLEEDING  TENDERNESS IN MOUTH AND THROAT WITH OR WITHOUT PRESENCE OF ULCERS  *URINARY PROBLEMS  *BOWEL PROBLEMS  UNUSUAL RASH Items with * indicate a potential emergency and should be followed up as soon as possible.  Feel free to call the clinic should you have any questions or concerns. The clinic phone number is [phone].  Please show the CHEMO ALERT CARD at check-in to the Emergency Department and triage nurse.  Daratumumab injection (Darzalex) What is this medicine? DARATUMUMAB (dar a toom ue mab) is a monoclonal antibody. It is used to treat multiple myeloma. This medicine may be used for other purposes; ask your health care provider or pharmacist if you have questions. COMMON BRAND NAME(S): DARZALEX What should I tell my health care provider before I take this medicine? They need to know if you have any of these conditions: -infection (especially a virus infection such as chickenpox, cold sores, or herpes) -lung or breathing disease -pregnant or trying to get pregnant -breast-feeding -an unusual or allergic reaction to daratumumab, other medicines, foods, dyes, or preservatives How should I use this medicine? This medicine is for infusion into a vein. It is given by a health care professional in a hospital or clinic setting. Talk to your pediatrician  regarding the use of this medicine in children. Special care may be needed. Overdosage: If you think you have taken too much of this medicine contact a poison control center or emergency room at once. NOTE: This medicine is only for you. Do not share this medicine with others. What if I miss a dose? Keep appointments for follow-up doses as directed. It is important not to miss your dose. Call your doctor or health care professional if you are unable to keep an appointment. What may interact with this medicine? Interactions have not been studied. Give your health care provider a list of all the medicines, herbs, non-prescription drugs, or dietary supplements you use. Also tell them if you smoke, drink alcohol, or use illegal drugs. Some items may interact with your medicine. This list may not describe all possible interactions. Give your health care provider a list of all the medicines, herbs, non-prescription drugs, or dietary supplements you use. Also tell them if you smoke, drink alcohol, or use illegal drugs. Some items may interact with your medicine. What should I watch for while using this medicine? This drug may make you feel generally unwell. Report any side effects. Continue your course of treatment even though you feel ill unless your doctor tells you to stop. This medicine can cause serious allergic reactions. To reduce your risk you may need to take medicine before treatment with this medicine. Take your medicine as directed. This medicine can affect the results of blood tests to match your blood type. These changes can last for up to 6 months after the final dose. Your healthcare provider will do blood tests to match your   blood type before you start treatment. Tell all of your healthcare providers that you are being treated with this medicine before receiving a blood transfusion. This medicine can affect the results of some tests used to determine treatment response; extra tests may be  needed to evaluate response. Do not become pregnant while taking this medicine or for 3 months after stopping it. Women should inform their doctor if they wish to become pregnant or think they might be pregnant. There is a potential for serious side effects to an unborn child. Talk to your health care professional or pharmacist for more information. What side effects may I notice from receiving this medicine? Side effects that you should report to your doctor or health care professional as soon as possible: -allergic reactions like skin rash, itching or hives, swelling of the face, lips, or tongue -breathing problems -chills -cough -dizziness -feeling faint or lightheaded -headache -low blood counts - this medicine may decrease the number of white blood cells, red blood cells and platelets. You may be at increased risk for infections and bleeding. -nausea, vomiting -shortness of breath -signs of decreased platelets or bleeding - bruising, pinpoint red spots on the skin, black, tarry stools, blood in the urine -signs of decreased red blood cells - unusually weak or tired, feeling faint or lightheaded, falls -signs of infection - fever or chills, cough, sore throat, pain or difficulty passing urine Side effects that usually do not require medical attention (report to your doctor or health care professional if they continue or are bothersome): -back pain -diarrhea -muscle cramps -pain, tingling, numbness in the hands or feet -swelling of the ankles, feet, hands -tiredness This list may not describe all possible side effects. Call your doctor for medical advice about side effects. You may report side effects to FDA at 1-800-FDA-1088. Where should I keep my medicine? Keep out of the reach of children. This drug is given in a hospital or clinic and will not be stored at home. NOTE: This sheet is a summary. It may not cover all possible information. If you have questions about this medicine,  talk to your doctor, pharmacist, or health care provider.  2018 Elsevier/Gold Standard (2015-05-29 10:38:11)  

## 2017-02-25 ENCOUNTER — Encounter: Payer: Self-pay | Admitting: Hematology & Oncology

## 2017-02-25 ENCOUNTER — Telehealth: Payer: Self-pay | Admitting: *Deleted

## 2017-02-25 NOTE — Telephone Encounter (Signed)
Patient is doing well today. She has no complaints and isn't experiencing any side effects. She has filled her prn medications and has them at home in case she needs them.   Reviewed prn medications, precautions and symptoms to call the office for any questions or concerns.

## 2017-02-28 ENCOUNTER — Encounter: Payer: Self-pay | Admitting: Hematology & Oncology

## 2017-02-28 ENCOUNTER — Other Ambulatory Visit: Payer: 59

## 2017-02-28 ENCOUNTER — Ambulatory Visit: Payer: 59

## 2017-03-02 DIAGNOSIS — M25662 Stiffness of left knee, not elsewhere classified: Secondary | ICD-10-CM | POA: Diagnosis not present

## 2017-03-02 DIAGNOSIS — M25562 Pain in left knee: Secondary | ICD-10-CM | POA: Diagnosis not present

## 2017-03-03 ENCOUNTER — Other Ambulatory Visit: Payer: 59

## 2017-03-03 ENCOUNTER — Ambulatory Visit: Payer: 59

## 2017-03-03 ENCOUNTER — Ambulatory Visit (HOSPITAL_BASED_OUTPATIENT_CLINIC_OR_DEPARTMENT_OTHER): Payer: 59 | Admitting: Family

## 2017-03-03 VITALS — BP 126/69 | HR 91 | Temp 98.0°F | Resp 20 | Wt 171.2 lb

## 2017-03-03 DIAGNOSIS — C9000 Multiple myeloma not having achieved remission: Secondary | ICD-10-CM | POA: Diagnosis not present

## 2017-03-03 DIAGNOSIS — M62838 Other muscle spasm: Secondary | ICD-10-CM | POA: Diagnosis not present

## 2017-03-03 DIAGNOSIS — M545 Low back pain: Secondary | ICD-10-CM | POA: Diagnosis not present

## 2017-03-03 NOTE — Progress Notes (Signed)
Hematology and Oncology Follow Up Visit  Barbara Cook [id number] 1956/04/22 61 y.o. 03/03/2017   Principle Diagnosis:  IgG kappa myeloma  Past Therapy: Kyprolis q week (3/1) - d/c'ed on 11/01/2016 for toxicity  Current Therapy:   Daratumumab q weekly therapy - s/p cycle 1 Zometa every 3 months    Interim History:  Barbara Cook is here today with c/o lower back pain and muscle spasms in both sides of her abdomen for several days. She states that now this is feeling much better and she is able to get up and move around. She received her first cycle of Daratumumab last week and back and musculoskeletal pain can occur (23%) with this treatment.  She has occasional weakness. This is not a new issue and is unchanged. No fever, chills, n/v, cough, rash, dizziness, SOB, chest pain, palpitations or changes in bowel or bladder habits.  Puffiness in her ankles and feet comes and goes. No tenderness, numbness or tingling in her extremities.  She has maintained a good appetite and is staying well hydrated. Her weight is stable.   ECOG Performance Status: 1 - Symptomatic but completely ambulatory  Medications:  Allergies as of 03/03/2017      Reactions   Codeine Palpitations      Medication List       Accurate as of 03/03/17  8:40 AM. Always use your most recent med list.          acetaminophen 500 MG tablet Commonly known as:  TYLENOL Take 500 mg by mouth every 6 (six) hours as needed for mild pain or headache.   ALIGN 4 MG Caps Take 1 capsule by mouth daily.   hyoscyamine 0.125 MG SL tablet Commonly known as:  LEVSIN SL Place 1 tablet (0.125 mg total) under the tongue every 4 (four) hours as needed.   magnesium oxide 400 (241.3 Mg) MG tablet Commonly known as:  MAG-OX TAKE 1 TABLET BY MOUTH TWICE A DAY   montelukast 10 MG tablet Commonly known as:  SINGULAIR Take 1 pill daily for 5 days starting today.   multivitamin tablet Take 1 tablet by mouth every evening.     ondansetron 8 MG tablet Commonly known as:  ZOFRAN TAKE 1 TABLET BY MOUTH TWICE A DAY FOR NAUSEA AND VOMITING 1 TABLET 1 HOUR PRIOR TO CHEMOTHERAPY   pantoprazole 40 MG tablet Commonly known as:  PROTONIX Take 1 tablet (40 mg total) by mouth daily.   polyethylene glycol powder powder Commonly known as:  GLYCOLAX/MIRALAX 1 capful daily as needed   torsemide 20 MG tablet Commonly known as:  DEMADEX Take 1 tablet (20 mg total) by mouth daily as needed.   vitamin B-6 250 MG tablet Take 1 tablet (250 mg total) by mouth daily.   Vitamin D (Ergocalciferol) 50000 units Caps capsule Commonly known as:  DRISDOL TAKE 1 CAPSULE (50,000 UNITS TOTAL) BY MOUTH ONCE A WEEK.       Allergies:  Allergies  Allergen Reactions  . Codeine Palpitations    Past Medical History, Surgical history, Social history, and Family History were reviewed and updated.  Review of Systems: All other 10 point review of systems is negative.   Physical Exam:  weight is 171 lb 4 oz (77.7 kg). Her oral temperature is 98 F (36.7 C). Her blood pressure is 126/69 and her pulse is 91. Her respiration is 20 and oxygen saturation is 100%.   Wt Readings from Last 3 Encounters:  03/03/17 171 lb 4 oz (77.7  kg)  01/13/17 172 lb 8 oz (78.2 kg)  12/24/16 168 lb 3.2 oz (76.3 kg)    Ocular: Sclerae unicteric, pupils equal, round and reactive to light Ear-nose-throat: Oropharynx clear, dentition fair Lymphatic: No cervical, supraclavicular or axillary adenopathy Lungs no rales or rhonchi, good excursion bilaterally Heart regular rate and rhythm, no murmur appreciated Abd soft, nontender, positive bowel sounds, no liver or spleen tip palpated on exam, no fluid wave  MSK no focal spinal tenderness, no joint edema Neuro: non-focal, well-oriented, appropriate affect Breasts: Deferred   Lab Results  Component Value Date   WBC 3.3 (L) 02/24/2017   HGB 9.4 (L) 02/24/2017   HCT 28.1 (L) 02/24/2017   MCV 91 02/24/2017    PLT 101 (L) 02/24/2017   Lab Results  Component Value Date   FERRITIN 102 02/20/2016   IRON 81 02/20/2016   TIBC 316 02/20/2016   UIBC 235 02/20/2016   IRONPCTSAT 26 02/20/2016   Lab Results  Component Value Date   RETICCTPCT 1.1 12/25/2014   RBC 3.08 (L) 02/24/2017   RETICCTABS 35.9 12/25/2014   Lab Results  Component Value Date   KPAFRELGTCHN 7.11 (H) 05/07/2015   LAMBDASER 1.65 05/07/2015   KAPLAMBRATIO 28.70 (H) 02/17/2017   Lab Results  Component Value Date   IGGSERUM 5,814 (H) 02/17/2017   IGA 116 05/07/2015   IGMSERUM 13 (L) 02/17/2017   Lab Results  Component Value Date   TOTALPROTELP 7.8 05/07/2015   ALBUMINELP 4.2 05/07/2015   A1GS 0.3 05/07/2015   A2GS 0.8 05/07/2015   BETS 0.4 05/07/2015   BETA2SER 0.3 05/07/2015   GAMS 1.9 (H) 05/07/2015   MSPIKE 4.2 (H) 02/17/2017   SPEI * 05/07/2015     Chemistry      Component Value Date/Time   NA 139 02/24/2017 0800   NA 139 02/20/2016 1207   K 3.7 02/24/2017 0800   K 3.8 02/20/2016 1207   CL 104 02/24/2017 0800   CO2 25 02/24/2017 0800   CO2 29 02/20/2016 1207   BUN 22 02/24/2017 0800   BUN 22.3 02/20/2016 1207   CREATININE 1.2 02/24/2017 0800   CREATININE 1.3 (H) 02/20/2016 1207      Component Value Date/Time   CALCIUM 10.1 02/24/2017 0800   CALCIUM 9.7 02/20/2016 1207   ALKPHOS 41 02/24/2017 0800   ALKPHOS 62 02/20/2016 1207   AST 46 (H) 02/24/2017 0800   AST 27 02/20/2016 1207   ALT 33 02/24/2017 0800   ALT 19 02/20/2016 1207   BILITOT 0.50 02/24/2017 0800   BILITOT 0.44 02/20/2016 1207      Impression and Plan: Barbara Cook is a very pleasant 61 yo African American female with IgG myeloma. She tolerated her first cycle of Daratumumab nicely. She has had some lower back pain and abdominal muscle spasms after. She is starting to feel much better and is able to get up and move around now. I will let Dr. Marin Olp know incase he would like to make any changes to her regimen next week.  We  discussed trying a muscle relaxer when needed. She will let us know if this is needed and we will send a prescription to her pharmacy.  We will plan to see her Monday for her next cycle of treatment.  Greater than 50% of her 15 minute face to face appointment was spent counseling and coordinating care.  She will contact our office with any questions or concerns. We can certainly see her sooner if need be.  Eliezer Bottom, NP 10/25/20188:40 AM

## 2017-03-04 ENCOUNTER — Other Ambulatory Visit: Payer: 59

## 2017-03-04 ENCOUNTER — Ambulatory Visit: Payer: 59

## 2017-03-04 DIAGNOSIS — M25662 Stiffness of left knee, not elsewhere classified: Secondary | ICD-10-CM | POA: Diagnosis not present

## 2017-03-04 DIAGNOSIS — M25562 Pain in left knee: Secondary | ICD-10-CM | POA: Diagnosis not present

## 2017-03-07 ENCOUNTER — Ambulatory Visit: Payer: 59

## 2017-03-07 ENCOUNTER — Other Ambulatory Visit (HOSPITAL_BASED_OUTPATIENT_CLINIC_OR_DEPARTMENT_OTHER): Payer: 59

## 2017-03-07 ENCOUNTER — Ambulatory Visit (HOSPITAL_BASED_OUTPATIENT_CLINIC_OR_DEPARTMENT_OTHER): Payer: 59

## 2017-03-07 ENCOUNTER — Other Ambulatory Visit: Payer: 59

## 2017-03-07 VITALS — BP 115/57 | HR 77 | Temp 97.9°F | Resp 16

## 2017-03-07 DIAGNOSIS — C9000 Multiple myeloma not having achieved remission: Secondary | ICD-10-CM | POA: Diagnosis not present

## 2017-03-07 DIAGNOSIS — Z5112 Encounter for antineoplastic immunotherapy: Secondary | ICD-10-CM

## 2017-03-07 LAB — CBC WITH DIFFERENTIAL (CANCER CENTER ONLY)
BASO#: 0 10*3/uL (ref 0.0–0.2)
BASO%: 0.3 % (ref 0.0–2.0)
EOS%: 1 % (ref 0.0–7.0)
Eosinophils Absolute: 0 10*3/uL (ref 0.0–0.5)
HEMATOCRIT: 27.9 % — AB (ref 34.8–46.6)
HGB: 9.3 g/dL — ABNORMAL LOW (ref 11.6–15.9)
LYMPH#: 1.4 10*3/uL (ref 0.9–3.3)
LYMPH%: 35.4 % (ref 14.0–48.0)
MCH: 30 pg (ref 26.0–34.0)
MCHC: 33.3 g/dL (ref 32.0–36.0)
MCV: 90 fL (ref 81–101)
MONO#: 0.5 10*3/uL (ref 0.1–0.9)
MONO%: 13.8 % — AB (ref 0.0–13.0)
NEUT#: 1.9 10*3/uL (ref 1.5–6.5)
NEUT%: 49.5 % (ref 39.6–80.0)
Platelets: 99 10*3/uL — ABNORMAL LOW (ref 145–400)
RBC: 3.1 10*6/uL — ABNORMAL LOW (ref 3.70–5.32)
RDW: 14 % (ref 11.1–15.7)
WBC: 3.9 10*3/uL (ref 3.9–10.0)

## 2017-03-07 LAB — CMP (CANCER CENTER ONLY)
ALT(SGPT): 28 U/L (ref 10–47)
AST: 47 U/L — AB (ref 11–38)
Albumin: 2.8 g/dL — ABNORMAL LOW (ref 3.3–5.5)
Alkaline Phosphatase: 29 U/L (ref 26–84)
BILIRUBIN TOTAL: 0.6 mg/dL (ref 0.20–1.60)
BUN, Bld: 18 mg/dL (ref 7–22)
CALCIUM: 11.7 mg/dL — AB (ref 8.0–10.3)
CHLORIDE: 105 meq/L (ref 98–108)
CO2: 28 meq/L (ref 18–33)
Creat: 1 mg/dl (ref 0.6–1.2)
GLUCOSE: 134 mg/dL — AB (ref 73–118)
Potassium: 3.8 mEq/L (ref 3.3–4.7)
Sodium: 139 mEq/L (ref 128–145)
Total Protein: 12 g/dL — ABNORMAL HIGH (ref 6.4–8.1)

## 2017-03-07 MED ORDER — PROCHLORPERAZINE MALEATE 10 MG PO TABS
ORAL_TABLET | ORAL | Status: AC
  Filled 2017-03-07: qty 1

## 2017-03-07 MED ORDER — ACETAMINOPHEN 325 MG PO TABS
650.0000 mg | ORAL_TABLET | Freq: Once | ORAL | Status: AC
  Administered 2017-03-07: 650 mg via ORAL

## 2017-03-07 MED ORDER — SODIUM CHLORIDE 0.9 % IV SOLN
Freq: Once | INTRAVENOUS | Status: AC
  Administered 2017-03-07: 10:00:00 via INTRAVENOUS

## 2017-03-07 MED ORDER — PROCHLORPERAZINE MALEATE 10 MG PO TABS
10.0000 mg | ORAL_TABLET | Freq: Once | ORAL | Status: AC
  Administered 2017-03-07: 10 mg via ORAL

## 2017-03-07 MED ORDER — LORAZEPAM 2 MG/ML IJ SOLN
INTRAMUSCULAR | Status: AC
  Filled 2017-03-07: qty 1

## 2017-03-07 MED ORDER — ACETAMINOPHEN 325 MG PO TABS
ORAL_TABLET | ORAL | Status: AC
  Filled 2017-03-07: qty 2

## 2017-03-07 MED ORDER — SODIUM CHLORIDE 0.9% FLUSH
10.0000 mL | INTRAVENOUS | Status: DC | PRN
  Administered 2017-03-07: 10 mL
  Filled 2017-03-07: qty 10

## 2017-03-07 MED ORDER — LORAZEPAM 2 MG/ML IJ SOLN
0.5000 mg | Freq: Once | INTRAMUSCULAR | Status: AC
  Administered 2017-03-07: 0.5 mg via INTRAVENOUS

## 2017-03-07 MED ORDER — SODIUM CHLORIDE 0.9 % IV SOLN
15.4000 mg/kg | Freq: Once | INTRAVENOUS | Status: AC
  Administered 2017-03-07: 1200 mg via INTRAVENOUS
  Filled 2017-03-07: qty 60

## 2017-03-07 MED ORDER — HEPARIN SOD (PORK) LOCK FLUSH 100 UNIT/ML IV SOLN
500.0000 [IU] | Freq: Once | INTRAVENOUS | Status: AC | PRN
  Administered 2017-03-07: 500 [IU]
  Filled 2017-03-07: qty 5

## 2017-03-07 MED ORDER — FAMOTIDINE IN NACL 20-0.9 MG/50ML-% IV SOLN
INTRAVENOUS | Status: AC
  Filled 2017-03-07: qty 100

## 2017-03-07 MED ORDER — DIPHENHYDRAMINE HCL 25 MG PO CAPS
ORAL_CAPSULE | ORAL | Status: AC
  Filled 2017-03-07: qty 2

## 2017-03-07 MED ORDER — FAMOTIDINE IN NACL 20-0.9 MG/50ML-% IV SOLN
40.0000 mg | Freq: Once | INTRAVENOUS | Status: AC
  Administered 2017-03-07: 40 mg via INTRAVENOUS

## 2017-03-07 MED ORDER — DIPHENHYDRAMINE HCL 25 MG PO CAPS
50.0000 mg | ORAL_CAPSULE | Freq: Once | ORAL | Status: AC
  Administered 2017-03-07: 50 mg via ORAL

## 2017-03-07 MED ORDER — METHYLPREDNISOLONE SODIUM SUCC 125 MG IJ SOLR
INTRAMUSCULAR | Status: AC
  Filled 2017-03-07: qty 2

## 2017-03-07 MED ORDER — METHYLPREDNISOLONE SODIUM SUCC 125 MG IJ SOLR
125.0000 mg | Freq: Once | INTRAMUSCULAR | Status: AC
  Administered 2017-03-07: 125 mg via INTRAVENOUS

## 2017-03-07 NOTE — Patient Instructions (Signed)
Daratumumab injection What is this medicine? DARATUMUMAB (dar a toom ue mab) is a monoclonal antibody. It is used to treat multiple myeloma. This medicine may be used for other purposes; ask your health care provider or pharmacist if you have questions. COMMON BRAND NAME(S): DARZALEX What should I tell my health care provider before I take this medicine? They need to know if you have any of these conditions: -infection (especially a virus infection such as chickenpox, cold sores, or herpes) -lung or breathing disease -pregnant or trying to get pregnant -breast-feeding -an unusual or allergic reaction to daratumumab, other medicines, foods, dyes, or preservatives How should I use this medicine? This medicine is for infusion into a vein. It is given by a health care professional in a hospital or clinic setting. Talk to your pediatrician regarding the use of this medicine in children. Special care may be needed. Overdosage: If you think you have taken too much of this medicine contact a poison control center or emergency room at once. NOTE: This medicine is only for you. Do not share this medicine with others. What if I miss a dose? Keep appointments for follow-up doses as directed. It is important not to miss your dose. Call your doctor or health care professional if you are unable to keep an appointment. What may interact with this medicine? Interactions have not been studied. Give your health care provider a list of all the medicines, herbs, non-prescription drugs, or dietary supplements you use. Also tell them if you smoke, drink alcohol, or use illegal drugs. Some items may interact with your medicine. This list may not describe all possible interactions. Give your health care provider a list of all the medicines, herbs, non-prescription drugs, or dietary supplements you use. Also tell them if you smoke, drink alcohol, or use illegal drugs. Some items may interact with your medicine. What  should I watch for while using this medicine? This drug may make you feel generally unwell. Report any side effects. Continue your course of treatment even though you feel ill unless your doctor tells you to stop. This medicine can cause serious allergic reactions. To reduce your risk you may need to take medicine before treatment with this medicine. Take your medicine as directed. This medicine can affect the results of blood tests to match your blood type. These changes can last for up to 6 months after the final dose. Your healthcare provider will do blood tests to match your blood type before you start treatment. Tell all of your healthcare providers that you are being treated with this medicine before receiving a blood transfusion. This medicine can affect the results of some tests used to determine treatment response; extra tests may be needed to evaluate response. Do not become pregnant while taking this medicine or for 3 months after stopping it. Women should inform their doctor if they wish to become pregnant or think they might be pregnant. There is a potential for serious side effects to an unborn child. Talk to your health care professional or pharmacist for more information. What side effects may I notice from receiving this medicine? Side effects that you should report to your doctor or health care professional as soon as possible: -allergic reactions like skin rash, itching or hives, swelling of the face, lips, or tongue -breathing problems -chills -cough -dizziness -feeling faint or lightheaded -headache -low blood counts - this medicine may decrease the number of white blood cells, red blood cells and platelets. You may be at increased risk  for infections and bleeding. -nausea, vomiting -shortness of breath -signs of decreased platelets or bleeding - bruising, pinpoint red spots on the skin, black, tarry stools, blood in the urine -signs of decreased red blood cells - unusually  weak or tired, feeling faint or lightheaded, falls -signs of infection - fever or chills, cough, sore throat, pain or difficulty passing urine Side effects that usually do not require medical attention (report to your doctor or health care professional if they continue or are bothersome): -back pain -diarrhea -muscle cramps -pain, tingling, numbness in the hands or feet -swelling of the ankles, feet, hands -tiredness This list may not describe all possible side effects. Call your doctor for medical advice about side effects. You may report side effects to FDA at 1-800-FDA-1088. Where should I keep my medicine? Keep out of the reach of children. This drug is given in a hospital or clinic and will not be stored at home. NOTE: This sheet is a summary. It may not cover all possible information. If you have questions about this medicine, talk to your doctor, pharmacist, or health care provider.  2018 Elsevier/Gold Standard (2015-05-29 10:38:11)  

## 2017-03-07 NOTE — Progress Notes (Signed)
OK to treat with platelets of 99 & elevated calcium. Pt advised to take Zometa today, but refuses. Pt wishes to continue to monitor Ca, then schedule separate appt if calcium remains elevated so she will know which drug causes particular side effects, if any. Dr Marin Olp aware. dph

## 2017-03-07 NOTE — Patient Instructions (Signed)
Implanted Port Home Guide An implanted port is a type of central line that is placed under the skin. Central lines are used to provide IV access when treatment or nutrition needs to be given through a person's veins. Implanted ports are used for long-term IV access. An implanted port may be placed because:  You need IV medicine that would be irritating to the small veins in your hands or arms.  You need long-term IV medicines, such as antibiotics.  You need IV nutrition for a long period.  You need frequent blood draws for lab tests.  You need dialysis.  Implanted ports are usually placed in the chest area, but they can also be placed in the upper arm, the abdomen, or the leg. An implanted port has two main parts:  Reservoir. The reservoir is round and will appear as a small, raised area under your skin. The reservoir is the part where a needle is inserted to give medicines or draw blood.  Catheter. The catheter is a thin, flexible tube that extends from the reservoir. The catheter is placed into a large vein. Medicine that is inserted into the reservoir goes into the catheter and then into the vein.  How will I care for my incision site? Do not get the incision site wet. Bathe or shower as directed by your health care provider. How is my port accessed? Special steps must be taken to access the port:  Before the port is accessed, a numbing cream can be placed on the skin. This helps numb the skin over the port site.  Your health care provider uses a sterile technique to access the port. ? Your health care provider must put on a mask and sterile gloves. ? The skin over your port is cleaned carefully with an antiseptic and allowed to dry. ? The port is gently pinched between sterile gloves, and a needle is inserted into the port.  Only "non-coring" port needles should be used to access the port. Once the port is accessed, a blood return should be checked. This helps ensure that the port  is in the vein and is not clogged.  If your port needs to remain accessed for a constant infusion, a clear (transparent) bandage will be placed over the needle site. The bandage and needle will need to be changed every week, or as directed by your health care provider.  Keep the bandage covering the needle clean and dry. Do not get it wet. Follow your health care provider's instructions on how to take a shower or bath while the port is accessed.  If your port does not need to stay accessed, no bandage is needed over the port.  What is flushing? Flushing helps keep the port from getting clogged. Follow your health care provider's instructions on how and when to flush the port. Ports are usually flushed with saline solution or a medicine called heparin. The need for flushing will depend on how the port is used.  If the port is used for intermittent medicines or blood draws, the port will need to be flushed: ? After medicines have been given. ? After blood has been drawn. ? As part of routine maintenance.  If a constant infusion is running, the port may not need to be flushed.  How long will my port stay implanted? The port can stay in for as long as your health care provider thinks it is needed. When it is time for the port to come out, surgery will be   done to remove it. The procedure is similar to the one performed when the port was put in. When should I seek immediate medical care? When you have an implanted port, you should seek immediate medical care if:  You notice a bad smell coming from the incision site.  You have swelling, redness, or drainage at the incision site.  You have more swelling or pain at the port site or the surrounding area.  You have a fever that is not controlled with medicine.  This information is not intended to replace advice given to you by your health care provider. Make sure you discuss any questions you have with your health care provider. Document  Released: 04/26/2005 Document Revised: 10/02/2015 Document Reviewed: 01/01/2013 Elsevier Interactive Patient Education  2017 [employer]  

## 2017-03-10 ENCOUNTER — Other Ambulatory Visit: Payer: 59

## 2017-03-10 ENCOUNTER — Ambulatory Visit: Payer: 59

## 2017-03-10 ENCOUNTER — Ambulatory Visit: Payer: 59 | Admitting: Hematology & Oncology

## 2017-03-11 ENCOUNTER — Other Ambulatory Visit: Payer: Self-pay | Admitting: *Deleted

## 2017-03-11 ENCOUNTER — Other Ambulatory Visit: Payer: Self-pay | Admitting: Family

## 2017-03-11 DIAGNOSIS — M25562 Pain in left knee: Secondary | ICD-10-CM | POA: Diagnosis not present

## 2017-03-11 DIAGNOSIS — C9000 Multiple myeloma not having achieved remission: Secondary | ICD-10-CM

## 2017-03-11 DIAGNOSIS — M25662 Stiffness of left knee, not elsewhere classified: Secondary | ICD-10-CM | POA: Diagnosis not present

## 2017-03-14 ENCOUNTER — Other Ambulatory Visit: Payer: 59

## 2017-03-14 ENCOUNTER — Ambulatory Visit: Payer: 59 | Admitting: Hematology & Oncology

## 2017-03-14 ENCOUNTER — Ambulatory Visit: Payer: 59

## 2017-03-14 ENCOUNTER — Encounter: Payer: Self-pay | Admitting: Hematology & Oncology

## 2017-03-18 ENCOUNTER — Encounter: Payer: Self-pay | Admitting: Family

## 2017-03-18 ENCOUNTER — Other Ambulatory Visit: Payer: Self-pay

## 2017-03-18 ENCOUNTER — Inpatient Hospital Stay (HOSPITAL_BASED_OUTPATIENT_CLINIC_OR_DEPARTMENT_OTHER)
Admission: EM | Admit: 2017-03-18 | Discharge: 2017-03-22 | DRG: 641 | Disposition: A | Discharge status: Home Health | Payer: 59 | Attending: Internal Medicine | Admitting: Internal Medicine

## 2017-03-18 ENCOUNTER — Emergency Department (HOSPITAL_BASED_OUTPATIENT_CLINIC_OR_DEPARTMENT_OTHER): Payer: 59

## 2017-03-18 ENCOUNTER — Ambulatory Visit (HOSPITAL_BASED_OUTPATIENT_CLINIC_OR_DEPARTMENT_OTHER): Payer: 59

## 2017-03-18 ENCOUNTER — Ambulatory Visit (HOSPITAL_BASED_OUTPATIENT_CLINIC_OR_DEPARTMENT_OTHER): Payer: 59 | Admitting: Family

## 2017-03-18 ENCOUNTER — Ambulatory Visit: Payer: 59

## 2017-03-18 VITALS — BP 125/54 | HR 105 | Temp 98.9°F | Resp 18 | Wt 169.5 lb

## 2017-03-18 DIAGNOSIS — R6889 Other general symptoms and signs: Secondary | ICD-10-CM

## 2017-03-18 DIAGNOSIS — Z87891 Personal history of nicotine dependence: Secondary | ICD-10-CM | POA: Diagnosis not present

## 2017-03-18 DIAGNOSIS — Z79899 Other long term (current) drug therapy: Secondary | ICD-10-CM | POA: Diagnosis not present

## 2017-03-18 DIAGNOSIS — R05 Cough: Secondary | ICD-10-CM

## 2017-03-18 DIAGNOSIS — C9000 Multiple myeloma not having achieved remission: Secondary | ICD-10-CM | POA: Diagnosis not present

## 2017-03-18 DIAGNOSIS — D649 Anemia, unspecified: Secondary | ICD-10-CM | POA: Diagnosis not present

## 2017-03-18 DIAGNOSIS — E860 Dehydration: Secondary | ICD-10-CM

## 2017-03-18 DIAGNOSIS — D6959 Other secondary thrombocytopenia: Secondary | ICD-10-CM | POA: Diagnosis present

## 2017-03-18 DIAGNOSIS — K219 Gastro-esophageal reflux disease without esophagitis: Secondary | ICD-10-CM | POA: Diagnosis present

## 2017-03-18 DIAGNOSIS — N179 Acute kidney failure, unspecified: Secondary | ICD-10-CM | POA: Diagnosis not present

## 2017-03-18 DIAGNOSIS — D630 Anemia in neoplastic disease: Secondary | ICD-10-CM | POA: Diagnosis present

## 2017-03-18 DIAGNOSIS — M899 Disorder of bone, unspecified: Secondary | ICD-10-CM

## 2017-03-18 DIAGNOSIS — E876 Hypokalemia: Secondary | ICD-10-CM | POA: Diagnosis not present

## 2017-03-18 DIAGNOSIS — R110 Nausea: Secondary | ICD-10-CM | POA: Diagnosis not present

## 2017-03-18 DIAGNOSIS — M353 Polymyalgia rheumatica: Secondary | ICD-10-CM | POA: Diagnosis present

## 2017-03-18 DIAGNOSIS — E875 Hyperkalemia: Secondary | ICD-10-CM | POA: Diagnosis not present

## 2017-03-18 DIAGNOSIS — R059 Cough, unspecified: Secondary | ICD-10-CM

## 2017-03-18 LAB — CBC WITH DIFFERENTIAL (CANCER CENTER ONLY)
BASO#: 0 10*3/uL (ref 0.0–0.2)
BASO%: 0.2 % (ref 0.0–2.0)
EOS ABS: 0 10*3/uL (ref 0.0–0.5)
EOS%: 0.5 % (ref 0.0–7.0)
HEMATOCRIT: 25.7 % — AB (ref 34.8–46.6)
HGB: 8.7 g/dL — ABNORMAL LOW (ref 11.6–15.9)
LYMPH#: 1.4 10*3/uL (ref 0.9–3.3)
LYMPH%: 31.2 % (ref 14.0–48.0)
MCH: 30.2 pg (ref 26.0–34.0)
MCHC: 33.9 g/dL (ref 32.0–36.0)
MCV: 89 fL (ref 81–101)
MONO#: 0.7 10*3/uL (ref 0.1–0.9)
MONO%: 14.7 % — ABNORMAL HIGH (ref 0.0–13.0)
NEUT#: 2.4 10*3/uL (ref 1.5–6.5)
NEUT%: 53.4 % (ref 39.6–80.0)
Platelets: 90 10*3/uL — ABNORMAL LOW (ref 145–400)
RBC: 2.88 10*6/uL — AB (ref 3.70–5.32)
RDW: 14.1 % (ref 11.1–15.7)
WBC: 4.4 10*3/uL (ref 3.9–10.0)

## 2017-03-18 LAB — CMP (CANCER CENTER ONLY)
ALK PHOS: 31 U/L (ref 26–84)
ALT: 23 U/L (ref 10–47)
AST: 52 U/L — ABNORMAL HIGH (ref 11–38)
Albumin: 2.9 g/dL — ABNORMAL LOW (ref 3.3–5.5)
BUN, Bld: 23 mg/dL — ABNORMAL HIGH (ref 7–22)
CALCIUM: 13.7 mg/dL — AB (ref 8.0–10.3)
CHLORIDE: 101 meq/L (ref 98–108)
CO2: 27 mEq/L (ref 18–33)
Creat: 2.2 mg/dl — ABNORMAL HIGH (ref 0.6–1.2)
GLUCOSE: 82 mg/dL (ref 73–118)
POTASSIUM: 3.5 meq/L (ref 3.3–4.7)
Sodium: 139 mEq/L (ref 128–145)
Total Bilirubin: 0.7 mg/dl (ref 0.20–1.60)
Total Protein: 13.2 g/dL — ABNORMAL HIGH (ref 6.4–8.1)

## 2017-03-18 LAB — BASIC METABOLIC PANEL
Anion gap: 1 — ABNORMAL LOW (ref 5–15)
BUN: 25 mg/dL — ABNORMAL HIGH (ref 6–20)
CALCIUM: 12.5 mg/dL — AB (ref 8.9–10.3)
CO2: 23 mmol/L (ref 22–32)
CREATININE: 1.83 mg/dL — AB (ref 0.44–1.00)
Chloride: 106 mmol/L (ref 101–111)
GFR, EST AFRICAN AMERICAN: 33 mL/min — AB (ref >60)
GFR, EST NON AFRICAN AMERICAN: 28 mL/min — AB (ref >60)
Glucose, Bld: 96 mg/dL (ref 65–99)
Potassium: 3.6 mmol/L (ref 3.5–5.1)
SODIUM: 130 mmol/L — AB (ref 135–145)

## 2017-03-18 MED ORDER — ENOXAPARIN SODIUM 40 MG/0.4ML ~~LOC~~ SOLN
40.0000 mg | Freq: Every day | SUBCUTANEOUS | Status: DC
  Administered 2017-03-19 – 2017-03-21 (×4): 40 mg via SUBCUTANEOUS
  Filled 2017-03-18 (×4): qty 0.4

## 2017-03-18 MED ORDER — HYOSCYAMINE SULFATE 0.125 MG SL SUBL
0.1250 mg | SUBLINGUAL_TABLET | SUBLINGUAL | Status: DC | PRN
  Filled 2017-03-18: qty 1

## 2017-03-18 MED ORDER — OSELTAMIVIR PHOSPHATE 30 MG PO CAPS
30.0000 mg | ORAL_CAPSULE | Freq: Two times a day (BID) | ORAL | Status: DC
  Administered 2017-03-19: 30 mg via ORAL
  Filled 2017-03-18 (×2): qty 1

## 2017-03-18 MED ORDER — SODIUM CHLORIDE 0.9 % IV SOLN
1000.0000 mL | Freq: Once | INTRAVENOUS | Status: AC
  Administered 2017-03-18: 1000 mL via INTRAVENOUS

## 2017-03-18 MED ORDER — PANTOPRAZOLE SODIUM 40 MG PO TBEC
40.0000 mg | DELAYED_RELEASE_TABLET | Freq: Every day | ORAL | Status: DC
  Administered 2017-03-19 – 2017-03-20 (×2): 40 mg via ORAL
  Filled 2017-03-18 (×2): qty 1

## 2017-03-18 MED ORDER — SODIUM CHLORIDE 0.9 % IJ SOLN
10.0000 mL | INTRAMUSCULAR | Status: DC | PRN
  Administered 2017-03-18: 10 mL
  Filled 2017-03-18: qty 10

## 2017-03-18 MED ORDER — ZOLEDRONIC ACID 4 MG/100ML IV SOLN
4.0000 mg | Freq: Once | INTRAVENOUS | Status: AC
  Administered 2017-03-18: 4 mg via INTRAVENOUS
  Filled 2017-03-18: qty 100

## 2017-03-18 MED ORDER — HYDROCODONE-HOMATROPINE 5-1.5 MG/5ML PO SYRP: 5.0000 mL | ORAL_SOLUTION | Freq: Four times a day (QID) | ORAL | 0 refills | Status: DC | PRN

## 2017-03-18 MED ORDER — ONDANSETRON HCL 4 MG PO TABS: 8.0000 mg | ORAL_TABLET | Freq: Three times a day (TID) | ORAL | Status: DC | PRN

## 2017-03-18 MED ORDER — MAGNESIUM OXIDE 400 (241.3 MG) MG PO TABS
400.0000 mg | ORAL_TABLET | Freq: Two times a day (BID) | ORAL | Status: DC
  Administered 2017-03-19 – 2017-03-22 (×8): 400 mg via ORAL
  Filled 2017-03-18 (×8): qty 1

## 2017-03-18 MED ORDER — SODIUM CHLORIDE 0.9% FLUSH
10.0000 mL | INTRAVENOUS | Status: DC | PRN
  Administered 2017-03-22: 10 mL
  Filled 2017-03-18: qty 40

## 2017-03-18 MED ORDER — OSELTAMIVIR PHOSPHATE 75 MG PO CAPS: 75.0000 mg | ORAL_CAPSULE | Freq: Two times a day (BID) | ORAL | 0 refills | Status: DC

## 2017-03-18 MED ORDER — SODIUM CHLORIDE 0.9 % IV BOLUS (SEPSIS)
500.0000 mL | Freq: Once | INTRAVENOUS | Status: AC
  Administered 2017-03-18: 500 mL via INTRAVENOUS

## 2017-03-18 MED ORDER — SODIUM CHLORIDE 0.9 % IV SOLN
INTRAVENOUS | Status: DC
  Administered 2017-03-19: via INTRAVENOUS

## 2017-03-18 MED ORDER — HEPARIN SOD (PORK) LOCK FLUSH 100 UNIT/ML IV SOLN
500.0000 [IU] | Freq: Once | INTRAVENOUS | Status: AC | PRN
  Administered 2017-03-18: 500 [IU]
  Filled 2017-03-18: qty 5

## 2017-03-18 MED ORDER — CALCITONIN (SALMON) 200 UNIT/ML IJ SOLN
500.0000 [IU] | Freq: Once | INTRAMUSCULAR | Status: AC
  Administered 2017-03-18: 500 [IU] via SUBCUTANEOUS
  Filled 2017-03-18: qty 2.5

## 2017-03-18 MED ORDER — BENZONATATE 100 MG PO CAPS: 200.0000 mg | ORAL_CAPSULE | Freq: Three times a day (TID) | ORAL | 0 refills | Status: DC | PRN

## 2017-03-18 NOTE — Progress Notes (Signed)
Patient was more confused and unstable on her feet after the zometa and IV fluids. Dr. Marin Olp notified.   Dr. Marin Olp chairside.   Patient reaccessed and transported to ED via wheelchair and her spouse accompanying.

## 2017-03-18 NOTE — Patient Instructions (Signed)
Implanted Port Home Guide An implanted port is a type of central line that is placed under the skin. Central lines are used to provide IV access when treatment or nutrition needs to be given through a person's veins. Implanted ports are used for long-term IV access. An implanted port may be placed because:  You need IV medicine that would be irritating to the small veins in your hands or arms.  You need long-term IV medicines, such as antibiotics.  You need IV nutrition for a long period.  You need frequent blood draws for lab tests.  You need dialysis.  Implanted ports are usually placed in the chest area, but they can also be placed in the upper arm, the abdomen, or the leg. An implanted port has two main parts:  Reservoir. The reservoir is round and will appear as a small, raised area under your skin. The reservoir is the part where a needle is inserted to give medicines or draw blood.  Catheter. The catheter is a thin, flexible tube that extends from the reservoir. The catheter is placed into a large vein. Medicine that is inserted into the reservoir goes into the catheter and then into the vein.  How will I care for my incision site? Do not get the incision site wet. Bathe or shower as directed by your health care provider. How is my port accessed? Special steps must be taken to access the port:  Before the port is accessed, a numbing cream can be placed on the skin. This helps numb the skin over the port site.  Your health care provider uses a sterile technique to access the port. ? Your health care provider must put on a mask and sterile gloves. ? The skin over your port is cleaned carefully with an antiseptic and allowed to dry. ? The port is gently pinched between sterile gloves, and a needle is inserted into the port.  Only "non-coring" port needles should be used to access the port. Once the port is accessed, a blood return should be checked. This helps ensure that the port  is in the vein and is not clogged.  If your port needs to remain accessed for a constant infusion, a clear (transparent) bandage will be placed over the needle site. The bandage and needle will need to be changed every week, or as directed by your health care provider.  Keep the bandage covering the needle clean and dry. Do not get it wet. Follow your health care provider's instructions on how to take a shower or bath while the port is accessed.  If your port does not need to stay accessed, no bandage is needed over the port.  What is flushing? Flushing helps keep the port from getting clogged. Follow your health care provider's instructions on how and when to flush the port. Ports are usually flushed with saline solution or a medicine called heparin. The need for flushing will depend on how the port is used.  If the port is used for intermittent medicines or blood draws, the port will need to be flushed: ? After medicines have been given. ? After blood has been drawn. ? As part of routine maintenance.  If a constant infusion is running, the port may not need to be flushed.  How long will my port stay implanted? The port can stay in for as long as your health care provider thinks it is needed. When it is time for the port to come out, surgery will be   done to remove it. The procedure is similar to the one performed when the port was put in. When should I seek immediate medical care? When you have an implanted port, you should seek immediate medical care if:  You notice a bad smell coming from the incision site.  You have swelling, redness, or drainage at the incision site.  You have more swelling or pain at the port site or the surrounding area.  You have a fever that is not controlled with medicine.  This information is not intended to replace advice given to you by your health care provider. Make sure you discuss any questions you have with your health care provider. Document  Released: 04/26/2005 Document Revised: 10/02/2015 Document Reviewed: 01/01/2013 Elsevier Interactive Patient Education  2017 [employer]  

## 2017-03-18 NOTE — Progress Notes (Signed)
Hematology and Oncology Follow Up Visit  Barbara Cook [id number] 01-10-1956 61 y.o. 03/18/2017   Principle Diagnosis:  IgG kappa myeloma  Past Therapy: Kyprolis q week (3/1) - d/c'ed on 11/01/2016 for toxicity  Current Therapy:   Daratumumab q weekly therapy - s/p cycle 1 Zometa every 3 months    Interim History:  Barbara Cook is here today with her husband with c/o aches all over and in her hips, fatigue, weakness, low grade fever (98.9) and dry cough.  She has been around her grandchildren and trick or treaters so we swabbed her for the flu. Her labs came back with an elevated calcium at 13.7. She missed treatment earlier this week. Her last treatment was on October 29th.  She denies chills, n/v, rash, dizziness, SOB, chest pain, palpitations, abdominal pain or changes in bowel or bladder habits.  She states that she is hydrating but has not been eating much. Her weight is down 2 lbs since last week.  No swelling in her extremities at this time. No numbness or tingling at this time.   ECOG Performance Status: 2 - Symptomatic, <50% confined to bed  Medications:  Allergies as of 03/18/2017      Reactions   Codeine Palpitations      Medication List        Accurate as of 03/18/17 12:17 PM. Always use your most recent med list.          acetaminophen 500 MG tablet Commonly known as:  TYLENOL Take 500 mg by mouth every 6 (six) hours as needed for mild pain or headache.   ALIGN 4 MG Caps Take 1 capsule by mouth daily.   hyoscyamine 0.125 MG SL tablet Commonly known as:  LEVSIN SL Place 1 tablet (0.125 mg total) under the tongue every 4 (four) hours as needed.   magnesium oxide 400 (241.3 Mg) MG tablet Commonly known as:  MAG-OX TAKE 1 TABLET BY MOUTH TWICE A DAY   montelukast 10 MG tablet Commonly known as:  SINGULAIR Take 1 pill daily for 5 days starting today.   multivitamin tablet Take 1 tablet by mouth every evening.   ondansetron 8 MG tablet Commonly known  as:  ZOFRAN TAKE 1 TABLET BY MOUTH TWICE A DAY FOR NAUSEA AND VOMITING 1 TABLET 1 HOUR PRIOR TO CHEMOTHERAPY   pantoprazole 40 MG tablet Commonly known as:  PROTONIX Take 1 tablet (40 mg total) by mouth daily.   polyethylene glycol powder powder Commonly known as:  GLYCOLAX/MIRALAX 1 capful daily as needed   torsemide 20 MG tablet Commonly known as:  DEMADEX Take 1 tablet (20 mg total) by mouth daily as needed.   vitamin B-6 250 MG tablet Take 1 tablet (250 mg total) by mouth daily.   Vitamin D (Ergocalciferol) 50000 units Caps capsule Commonly known as:  DRISDOL TAKE 1 CAPSULE (50,000 UNITS TOTAL) BY MOUTH ONCE A WEEK.       Allergies:  Allergies  Allergen Reactions  . Codeine Palpitations    Past Medical History, Surgical history, Social history, and Family History were reviewed and updated.  Review of Systems: All other 10 point review of systems is negative.   Physical Exam:  oral temperature is 98.9 F (37.2 C). Her blood pressure is 125/54 (abnormal) and her pulse is 105 (abnormal). Her respiration is 18 and oxygen saturation is 98%.   Wt Readings from Last 3 Encounters:  03/03/17 171 lb 4 oz (77.7 kg)  01/13/17 172 lb 8 oz (78.2 kg)  12/24/16 168 lb 3.2 oz (76.3 kg)    Ocular: Sclerae unicteric, pupils equal, round and reactive to light Ear-nose-throat: Oropharynx clear, dentition fair Lymphatic: No cervical, supraclavicular or axillary adenopathy Lungs no rales or rhonchi, good excursion bilaterally Heart regular rate and rhythm, no murmur appreciated Abd soft, nontender, positive bowel sounds, no liver or spleen tip palpated on exam, no fluid wave  MSK no focal spinal tenderness, no joint edema Neuro: non-focal, well-oriented, appropriate affect Breasts: Deferred   Lab Results  Component Value Date   WBC 4.4 03/18/2017   HGB 8.7 (L) 03/18/2017   HCT 25.7 (L) 03/18/2017   MCV 89 03/18/2017   PLT 90 (L) 03/18/2017   Lab Results  Component Value  Date   FERRITIN 102 02/20/2016   IRON 81 02/20/2016   TIBC 316 02/20/2016   UIBC 235 02/20/2016   IRONPCTSAT 26 02/20/2016   Lab Results  Component Value Date   RETICCTPCT 1.1 12/25/2014   RBC 2.88 (L) 03/18/2017   RETICCTABS 35.9 12/25/2014   Lab Results  Component Value Date   KPAFRELGTCHN 7.11 (H) 05/07/2015   LAMBDASER 1.65 05/07/2015   KAPLAMBRATIO 28.70 (H) 02/17/2017   Lab Results  Component Value Date   IGGSERUM 5,814 (H) 02/17/2017   IGA 116 05/07/2015   IGMSERUM 13 (L) 02/17/2017   Lab Results  Component Value Date   TOTALPROTELP 7.8 05/07/2015   ALBUMINELP 4.2 05/07/2015   A1GS 0.3 05/07/2015   A2GS 0.8 05/07/2015   BETS 0.4 05/07/2015   BETA2SER 0.3 05/07/2015   GAMS 1.9 (H) 05/07/2015   MSPIKE 4.2 (H) 02/17/2017   SPEI * 05/07/2015     Chemistry      Component Value Date/Time   NA 139 03/07/2017 0809   NA 139 02/20/2016 1207   K 3.8 03/07/2017 0809   K 3.8 02/20/2016 1207   CL 105 03/07/2017 0809   CO2 28 03/07/2017 0809   CO2 29 02/20/2016 1207   BUN 18 03/07/2017 0809   BUN 22.3 02/20/2016 1207   CREATININE 1.0 03/07/2017 0809   CREATININE 1.3 (H) 02/20/2016 1207      Component Value Date/Time   CALCIUM 11.7 (H) 03/07/2017 0809   CALCIUM 9.7 02/20/2016 1207   ALKPHOS 29 03/07/2017 0809   ALKPHOS 62 02/20/2016 1207   AST 47 (H) 03/07/2017 0809   AST 27 02/20/2016 1207   ALT 28 03/07/2017 0809   ALT 19 02/20/2016 1207   BILITOT 0.60 03/07/2017 0809   BILITOT 0.44 02/20/2016 1207      Impression and Plan: Barbara Cook is a very pleasant 61 yo African American female with IgG myeloma. She is here today for an unscheduled visit with c/o fatigue, weakness, aches all over and in hips, low grade fever and dry cough.  Her calcium is up to 13.7 so we will give her fluids, calcitonin and Zometa. She missed her treatment earlier this week.  We will also treat her for flu since she was around small children and is symptomatic. She was swabbed.  We  will also give her tessalon perles for her cough. She could also try Robitussin over the counter.  Greater than 50% of her 25 minute face to face visit was spent counseling and coordinating care.  We will plan to see her back for her scheduled appointment on Monday. We will repeat lab work and hopefully be able to resume treatment at that time.   Eliezer Bottom, NP 11/9/201812:17 PM

## 2017-03-18 NOTE — ED Provider Notes (Signed)
[place] EMERGENCY DEPARTMENT Provider Note   CSN: [id number] Arrival date & time: 03/18/17  1614     History   Chief Complaint Chief Complaint  Patient presents with  . Fatigue    HPI Barbara Cook is a 61 y.o. female.  HPI Patient has multiple myeloma.  Has been feeling generally weak over the last week.  Seen at the cancer center today and diagnosed with hypercalcemia with a calcium of 13.  Has not really had problems with hypercalcemia in the past.  Also her creatinine has gone from normal baseline up to 2.2.  Receive some IV fluids and Zometa at the cancer center.  Lightheadedness and dizzy after the infusion and was sent to the ER.  He has had a decreased appetite overall.  No real nausea vomiting or diarrhea.  She is on Daratumumab.  Her weight is down 2 pounds since last week. Also has had a cough.  No fevers or chills.  States she is just felt bad.  Flu test sent at the cancer center and was going to empirically treat. Past Medical History:  Diagnosis Date  . Anemia   . Arthritis   . Arthropathy, unspecified, site unspecified   . Complication of anesthesia    02/13/14- had biospy in X-Ray- "twilight"  "I felt every thing"  . Constipation   . Endometriosis   . Family history of adverse reaction to anesthesia    Sister- patient will find out  . Fibroid   . GERD (gastroesophageal reflux disease)   . Heart murmur   . IBS (irritable bowel syndrome)   . MGUS (monoclonal gammopathy of unknown significance) 2020-11-1211  . Multiple myeloma ([place]) 02/18/2014  . Myelitis ([place]) 2011   of bone- neck  . Neuromuscular disorder ([place])   . Polymyalgia rheumatica ([place])   . PONV (postoperative nausea and vomiting)    1990    Patient Active Problem List   Diagnosis Date Noted  . Multiple myeloma not having achieved remission ([place]) 02/17/2017  . Gastritis and gastroduodenitis 11/04/2016  . Bloating 11/04/2016  . Dyspepsia 11/04/2016  . Lytic bone lesions on xray  03/05/2016  . Deviated septum 12/31/2015  . Epistaxis, recurrent 12/31/2015  . Otorrhagia of left ear 12/31/2015  . Seasonal allergic rhinitis 12/31/2015  . Multiple myeloma ([place]) 02/18/2014  . Abnormal CXR 01/11/2014  . Abnormality of gait 02/16/2013  . Polymyalgia rheumatica ([place])   . GERD (gastroesophageal reflux disease)   . Arthritis   . Irritable bowel syndrome 10/18/2011  . Altered bowel function 08/30/2011  . Abdominal pain, left upper quadrant 11/10/2010  . ARTHRITIS 04/09/2008    Past Surgical History:  Procedure Laterality Date  . COLONOSCOPY    . Fistula repair surg    . IR GENERIC HISTORICAL  04/14/2016   IR FLUORO GUIDE PORT INSERTION RIGHT 04/14/2016 Greggory Keen, MD [facility]INTER[facility]  . IR GENERIC HISTORICAL  04/14/2016   IR US GUIDE VASC ACCESS RIGHT 04/14/2016 Greggory Keen, MD [facility]  . KNEE ARTHROSCOPY Left   . NECK SURGERY  2011   Myletis- bone grafting- from her left hip  . TUBAL LIGATION    . UMBILICAL HERNIA REPAIR     age 88    OB History    Gravida Para Term Preterm AB Living   1 1 1     1    SAB TAB Ectopic Multiple Live Births  Home Medications    Prior to Admission medications   Medication Sig Start Date End Date Taking? Authorizing Provider  acetaminophen (TYLENOL) 500 MG tablet Take 500 mg by mouth every 6 (six) hours as needed for mild pain or headache.    [provider]  benzonatate (TESSALON PERLES) 100 MG capsule Take 2 capsules (200 mg total) 3 (three) times daily as needed by mouth for cough. 03/18/17   [place], Holli Humbles, NP  hyoscyamine (LEVSIN SL) 0.125 MG SL tablet Place 1 tablet (0.125 mg total) under the tongue every 4 (four) hours as needed. 03/24/16   Pyrtle, Lajuan Lines, MD  magnesium oxide (MAG-OX) 400 (241.3 Mg) MG tablet TAKE 1 TABLET BY MOUTH TWICE A DAY 06/22/16   [name], MD  montelukast (SINGULAIR) 10 MG tablet Take 1 pill daily for 5 days starting today. 02/17/17   [name], MD    Multiple Vitamin (MULTIVITAMIN) tablet Take 1 tablet by mouth every evening.     [provider]  ondansetron (ZOFRAN) 8 MG tablet TAKE 1 TABLET BY MOUTH TWICE A DAY FOR NAUSEA AND VOMITING 1 TABLET 1 HOUR PRIOR TO CHEMOTHERAPY 02/25/17   [name], MD  oseltamivir (TAMIFLU) 75 MG capsule Take 1 capsule (75 mg total) 2 (two) times daily by mouth. 03/18/17   [place], Holli Humbles, NP  pantoprazole (PROTONIX) 40 MG tablet Take 1 tablet (40 mg total) by mouth daily. 11/04/16   Zehr, Janett Billow D, PA-C  polyethylene glycol powder (GLYCOLAX/MIRALAX) powder 1 capful daily as needed 06/05/15   Mauri Pole, MD  Probiotic Product (ALIGN) 4 MG CAPS Take 1 capsule by mouth daily.    [provider]  Pyridoxine HCl (VITAMIN B-6) 250 MG tablet Take 1 tablet (250 mg total) by mouth daily. 08/21/15   [name], MD  torsemide (DEMADEX) 20 MG tablet Take 1 tablet (20 mg total) by mouth daily as needed. 06/03/14   [name], MD  Vitamin D, Ergocalciferol, (DRISDOL) 50000 units CAPS capsule TAKE 1 CAPSULE (50,000 UNITS TOTAL) BY MOUTH ONCE A WEEK. 02/07/17   [name], MD    Family History Family History  Problem Relation Age of Onset  . Heart disease Mother   . Rheum arthritis Mother   . Lung cancer Maternal Uncle   . Throat cancer Maternal Aunt   . Diabetes Maternal Grandmother   . Hypertension Maternal Grandmother   . Heart disease Maternal Grandmother   . Heart disease Paternal Grandmother   . Colon cancer Neg Hx   . Esophageal cancer Neg Hx   . Stomach cancer Neg Hx   . Rectal cancer Neg Hx     Social History Social History   Tobacco Use  . Smoking status: Former Smoker    Packs/day: 25.00    Years: 5.00    Pack years: 125.00    Types: Cigarettes    Start date: 05/26/1988    Last attempt to quit: 01/11/1994    Years since quitting: 23.1  . Smokeless tobacco: Never Used  . Tobacco comment: QUIT SMOKING 20 YEARS AGO  Substance Use Topics  . Alcohol  use: No    Alcohol/week: 0.0 oz  . Drug use: No     Allergies   Codeine   Review of Systems Review of Systems  Constitutional: Positive for appetite change and fatigue. Negative for fever.  Respiratory: Positive for cough.   Cardiovascular: Negative for chest pain.  Gastrointestinal: Negative for abdominal pain.  Genitourinary: Negative for flank pain.  Musculoskeletal: Positive for arthralgias.  Neurological: Negative for seizures.  Hematological: Negative for adenopathy.  Psychiatric/Behavioral: Negative for confusion.     Physical Exam Updated Vital Signs BP (!) 144/76 (BP Location: Left Arm)   Pulse (!) 102   Temp 98.3 F (36.8 C) (Oral)   Resp 18   SpO2 97%   Physical Exam  Constitutional: She appears well-developed.  Eyes: Pupils are equal, round, and reactive to light.  Neck: Normal range of motion.  Cardiovascular: Normal rate.  Pulmonary/Chest:  Port-A-Cath right chest wall  Abdominal: Soft. There is no tenderness.  Musculoskeletal: She exhibits no edema.  Neurological: She is alert.  Skin: Skin is warm. Capillary refill takes less than 2 seconds.  Psychiatric: She has a normal mood and affect.     ED Treatments / Results  Labs (all labs ordered are listed, but only abnormal results are displayed) Labs Reviewed  BASIC METABOLIC PANEL    EKG  EKG Interpretation  Date/Time:  Friday March 18 2017 16:40:05 EST Ventricular Rate:  101 PR Interval:    QRS Duration: 77 QT Interval:  324 QTC Calculation: 420 R Axis:   63 Text Interpretation:  Sinus tachycardia Consider left atrial enlargement Low voltage, precordial leads Confirmed by Davonna Belling [phone]) on 03/18/2017 4:44:48 PM       Radiology No results found.  Procedures Procedures (including critical care time)  Medications Ordered in ED Medications  sodium chloride 0.9 % bolus 500 mL (not administered)     Initial Impression / Assessment and Plan / ED Course  I have  reviewed the triage vital signs and the nursing notes.  Pertinent labs & imaging results that were available during my care of the patient were reviewed by me and considered in my medical decision making (see chart for details).     Patient with generalized weakness and decreased appetite.  Has worsening renal function.  Has hypercalcemia with calcium up to 13.  EKG reassuring.  Had fluids and Zometa infusion.  However after that still feels weak and is having difficulty ambulating.  Sent down from cancer center and will admit to hospitalist.  Final Clinical Impressions(s) / ED Diagnoses   Final diagnoses:  Hypercalcemia  Acute kidney injury [facility])    ED Discharge Orders    None       Davonna Belling, MD 03/18/17 1645

## 2017-03-18 NOTE — ED Notes (Signed)
Right chest Ported accessed by oncology clinic PTA drg clean dry intact, blood return

## 2017-03-18 NOTE — ED Notes (Signed)
ED Provider at bedside. 

## 2017-03-18 NOTE — Patient Instructions (Signed)
Hypercalcemia Hypercalcemia is having too much calcium in the blood. The body needs calcium to make bones and keep them strong. Calcium also helps the muscles, nerves, brain, and heart work the way they should. Most of the calcium in the body is in the bones. There is also some calcium in the blood. Hypercalcemia can happen when calcium comes out of the bones, or when the kidneys are not able to remove calcium from the blood. Hypercalcemia can be mild or severe. What are the causes? There are many possible causes of hypercalcemia. Common causes include:  Hyperparathyroidism. This is a condition in which the body produces too much parathyroid hormone. There are four parathyroid glands in your neck. These glands produce a chemical messenger (hormone) that helps the body absorb calcium from foods and helps your bones release calcium.  Certain kinds of cancer, such as lung cancer, breast cancer, or myeloma.  Less common causes of hypercalcemia include:  Getting too much calcium or vitamin D from your diet.  Kidney failure.  Hyperthyroidism.  Being on bed rest for a long time.  Certain medicines.  Infections.  Sarcoidosis.  What increases the risk? This condition is more likely to develop in:  Women.  People who are 60 years or older.  People who have a family history of hypercalcemia.  What are the signs or symptoms? Mild hypercalcemia that starts slowly may not cause symptoms. Severe, sudden hypercalcemia is more likely to cause symptoms, such as:  Loss of appetite.  Increased thirst and frequent urination.  Fatigue.  Nausea and vomiting.  Headache.  Abdominal pain.  Muscle pain, twitching, or weakness.  Constipation.  Blood in the urine.  Pain in the side of the back (flank pain).  Anxiety, confusion, or depression.  Irregular heartbeat (arrhythmia).  Loss of consciousness.  How is this diagnosed? This condition may be diagnosed based on:  Your  symptoms.  Blood tests.  Urine tests.  X-rays.  Ultrasound.  MRI.  CT scan.  How is this treated? Treatment for hypercalcemia depends on the cause. Treatment may include:  Receiving fluids through an IV tube.  Medicines that keep calcium levels steady after receiving fluids (loop diuretics).  Medicines that keep calcium in your bones (bisphosphonates).  Medicines that lower the calcium level in your blood.  Surgery to remove overactive parathyroid glands.  Follow these instructions at home:  Take over-the-counter and prescription medicines only as told by your health care provider.  Follow instructions from your health care provider about eating or drinking restrictions.  Drink enough fluid to keep your urine clear or pale yellow.  Stay active. Weight-bearing exercise helps to keep calcium in your bones. Follow instructions from your health care provider about what type and level of exercise is safe for you.  Keep all follow-up visits as told by your health care provider. This is important. Contact a health care provider if:  You have a fever.  You have flank or abdominal pain that is getting worse. Get help right away if:  You have severe abdominal or flank pain.  You have chest pain.  You have trouble breathing.  You become very confused and sleepy.  You lose consciousness. This information is not intended to replace advice given to you by your health care provider. Make sure you discuss any questions you have with your health care provider. Document Released: 07/10/2004 Document Revised: 10/02/2015 Document Reviewed: 09/11/2014 Elsevier Interactive Patient Education  2018 [place].  Zoledronic Acid injection (Hypercalcemia, Oncology) What is  this medicine? ZOLEDRONIC ACID (ZOE le dron ik AS id) lowers the amount of calcium loss from bone. It is used to treat too much calcium in your blood from cancer. It is also used to prevent complications of  cancer that has spread to the bone. This medicine may be used for other purposes; ask your health care provider or pharmacist if you have questions. COMMON BRAND NAME(S): Zometa What should I tell my health care provider before I take this medicine? They need to know if you have any of these conditions: -aspirin-sensitive asthma -cancer, especially if you are receiving medicines used to treat cancer -dental disease or wear dentures -infection -kidney disease -receiving corticosteroids like dexamethasone or prednisone -an unusual or allergic reaction to zoledronic acid, other medicines, foods, dyes, or preservatives -pregnant or trying to get pregnant -breast-feeding How should I use this medicine? This medicine is for infusion into a vein. It is given by a health care professional in a hospital or clinic setting. Talk to your pediatrician regarding the use of this medicine in children. Special care may be needed. Overdosage: If you think you have taken too much of this medicine contact a poison control center or emergency room at once. NOTE: This medicine is only for you. Do not share this medicine with others. What if I miss a dose? It is important not to miss your dose. Call your doctor or health care professional if you are unable to keep an appointment. What may interact with this medicine? -certain antibiotics given by injection -NSAIDs, medicines for pain and inflammation, like ibuprofen or naproxen -some diuretics like bumetanide, furosemide -teriparatide -thalidomide This list may not describe all possible interactions. Give your health care provider a list of all the medicines, herbs, non-prescription drugs, or dietary supplements you use. Also tell them if you smoke, drink alcohol, or use illegal drugs. Some items may interact with your medicine. What should I watch for while using this medicine? Visit your doctor or health care professional for regular checkups. It may be some  time before you see the benefit from this medicine. Do not stop taking your medicine unless your doctor tells you to. Your doctor may order blood tests or other tests to see how you are doing. Women should inform their doctor if they wish to become pregnant or think they might be pregnant. There is a potential for serious side effects to an unborn child. Talk to your health care professional or pharmacist for more information. You should make sure that you get enough calcium and vitamin D while you are taking this medicine. Discuss the foods you eat and the vitamins you take with your health care professional. Some people who take this medicine have severe bone, joint, and/or muscle pain. This medicine may also increase your risk for jaw problems or a broken thigh bone. Tell your doctor right away if you have severe pain in your jaw, bones, joints, or muscles. Tell your doctor if you have any pain that does not go away or that gets worse. Tell your dentist and dental surgeon that you are taking this medicine. You should not have major dental surgery while on this medicine. See your dentist to have a dental exam and fix any dental problems before starting this medicine. Take good care of your teeth while on this medicine. Make sure you see your dentist for regular follow-up appointments. What side effects may I notice from receiving this medicine? Side effects that you should report to your doctor  or health care professional as soon as possible: -allergic reactions like skin rash, itching or hives, swelling of the face, lips, or tongue -anxiety, confusion, or depression -breathing problems -changes in vision -eye pain -feeling faint or lightheaded, falls -jaw pain, especially after dental work -mouth sores -muscle cramps, stiffness, or weakness -redness, blistering, peeling or loosening of the skin, including inside the mouth -trouble passing urine or change in the amount of urine Side effects that  usually do not require medical attention (report to your doctor or health care professional if they continue or are bothersome): -bone, joint, or muscle pain -constipation -diarrhea -fever -hair loss -irritation at site where injected -loss of appetite -nausea, vomiting -stomach upset -trouble sleeping -trouble swallowing -weak or tired This list may not describe all possible side effects. Call your doctor for medical advice about side effects. You may report side effects to FDA at 1-800-FDA-1088. Where should I keep my medicine? This drug is given in a hospital or clinic and will not be stored at home. NOTE: This sheet is a summary. It may not cover all possible information. If you have questions about this medicine, talk to your doctor, pharmacist, or health care provider.  2018 Elsevier/Gold Standard (2013-09-22 14:19:39)

## 2017-03-18 NOTE — ED Notes (Signed)
Family at bedside. 

## 2017-03-18 NOTE — H&P (Signed)
History and Physical    Barbara Cook URK:[id number] DOB: 11/29/1955 DOA: 03/18/2017  PCP: [name], MD  Patient coming from: home    Chief Complaint: fatigue  HPI: [name] is a 61 y.o. female with medical history significant for multiple myeloma presents w/ weakness.  Has had about a week of progressively worsening weakness and fatigue. No headache or abdominal pain. Oral intake has been decreased because of this. No fevers but does have slight dry cough past few days. No diarrhea. Occasional nausea/vomiting unchanged from baseline.  Presented to oncologist today where labs were checked revealing aki and hypercalcemia. Fluid bolus as well as calcitonin and zoledronic acid were given, and was referred to ED for admission.  ED Course: CXR, IV fluids, labs  Review of Systems: As per HPI otherwise 10 point review of systems negative.   Past Medical History:  Diagnosis Date  . Anemia   . Arthritis   . Arthropathy, unspecified, site unspecified   . Complication of anesthesia    02/13/14- had biospy in X-Ray- "twilight"  "I felt every thing"  . Constipation   . Endometriosis   . Family history of adverse reaction to anesthesia    Sister- patient will find out  . Fibroid   . GERD (gastroesophageal reflux disease)   . Heart murmur   . IBS (irritable bowel syndrome)   . MGUS (monoclonal gammopathy of unknown significance) 11-04-2011  . Multiple myeloma ([place]) 02/18/2014  . Myelitis ([place]) 2011   of bone- neck  . Neuromuscular disorder ([place])   . Polymyalgia rheumatica ([place])   . PONV (postoperative nausea and vomiting)    1990    Past Surgical History:  Procedure Laterality Date  . COLONOSCOPY    . Fistula repair surg    . IR GENERIC HISTORICAL  04/14/2016   IR FLUORO GUIDE PORT INSERTION RIGHT 04/14/2016 Greggory Keen, MD [facility]  . IR GENERIC HISTORICAL  04/14/2016   IR US GUIDE VASC ACCESS RIGHT 04/14/2016 Greggory Keen, MD [facility]  . KNEE ARTHROSCOPY  Left   . NECK SURGERY  2011   Myletis- bone grafting- from her left hip  . TUBAL LIGATION    . UMBILICAL HERNIA REPAIR     age 79     reports that she quit smoking about 23 years ago. Her smoking use included cigarettes. She started smoking about 28 years ago. She has a 125.00 pack-year smoking history. she has never used smokeless tobacco. She reports that she does not drink alcohol or use drugs.  Allergies  Allergen Reactions  . Codeine Palpitations    Family History  Problem Relation Age of Onset  . Heart disease Mother   . Rheum arthritis Mother   . Lung cancer Maternal Uncle   . Throat cancer Maternal Aunt   . Diabetes Maternal Grandmother   . Hypertension Maternal Grandmother   . Heart disease Maternal Grandmother   . Heart disease Paternal Grandmother   . Colon cancer Neg Hx   . Esophageal cancer Neg Hx   . Stomach cancer Neg Hx   . Rectal cancer Neg Hx      Prior to Admission medications   Medication Sig Start Date End Date Taking? Authorizing Provider  acetaminophen (TYLENOL) 500 MG tablet Take 500 mg by mouth every 6 (six) hours as needed for mild pain or headache.    [provider]  benzonatate (TESSALON PERLES) 100 MG capsule Take 2 capsules (200 mg total) 3 (three) times daily as  needed by mouth for cough. 03/18/17   [place], Holli Humbles, NP  hyoscyamine (LEVSIN SL) 0.125 MG SL tablet Place 1 tablet (0.125 mg total) under the tongue every 4 (four) hours as needed. 03/24/16   Pyrtle, Lajuan Lines, MD  magnesium oxide (MAG-OX) 400 (241.3 Mg) MG tablet TAKE 1 TABLET BY MOUTH TWICE A DAY 06/22/16   [name], MD  montelukast (SINGULAIR) 10 MG tablet Take 1 pill daily for 5 days starting today. 02/17/17   [name], MD  Multiple Vitamin (MULTIVITAMIN) tablet Take 1 tablet by mouth every evening.     [provider]  ondansetron (ZOFRAN) 8 MG tablet TAKE 1 TABLET BY MOUTH TWICE A DAY FOR NAUSEA AND VOMITING 1 TABLET 1 HOUR PRIOR TO CHEMOTHERAPY  02/25/17   [name], MD  oseltamivir (TAMIFLU) 75 MG capsule Take 1 capsule (75 mg total) 2 (two) times daily by mouth. 03/18/17   [place], Holli Humbles, NP  pantoprazole (PROTONIX) 40 MG tablet Take 1 tablet (40 mg total) by mouth daily. 11/04/16   Zehr, Janett Billow D, PA-C  polyethylene glycol powder (GLYCOLAX/MIRALAX) powder 1 capful daily as needed 06/05/15   Mauri Pole, MD  Probiotic Product (ALIGN) 4 MG CAPS Take 1 capsule by mouth daily.    [provider]  Pyridoxine HCl (VITAMIN B-6) 250 MG tablet Take 1 tablet (250 mg total) by mouth daily. 08/21/15   [name], MD  torsemide (DEMADEX) 20 MG tablet Take 1 tablet (20 mg total) by mouth daily as needed. 06/03/14   [name], MD  Vitamin D, Ergocalciferol, (DRISDOL) 50000 units CAPS capsule TAKE 1 CAPSULE (50,000 UNITS TOTAL) BY MOUTH ONCE A WEEK. 02/07/17   [name], MD    Physical Exam: Vitals:   03/18/17 1625 03/18/17 1710 03/18/17 1809 03/18/17 2038  BP: (!) 144/76 102/86 133/70 132/75  Pulse: (!) 102 (!) 103 (!) 106 (!) 112  Resp: _0 Temp: 98.3 F (36.8 C)   98.8 F (37.1 C)  TempSrc: Oral   Oral  SpO2: 97% 96% 94% 97%  Weight:    76.9 kg (169 lb 8 oz)  Height:    _1  (1.626 m)    Constitutional: NAD, calm, comfortable Vitals:   03/18/17 1625 03/18/17 1710 03/18/17 1809 03/18/17 2038  BP: (!) 144/76 102/86 133/70 132/75  Pulse: (!) 102 (!) 103 (!) 106 (!) 112  Resp: _2 Temp: 98.3 F (36.8 C)   98.8 F (37.1 C)  TempSrc: Oral   Oral  SpO2: 97% 96% 94% 97%  Weight:    76.9 kg (169 lb 8 oz)  Height:    _3  (1.626 m)   Ill appearing, somnolent Eyes: conjunctiva normal ENMT: Mucous membranes are moist.  Neck: supple Respiratory: clear to auscultation bilaterally, no wheezing, no crackles. Normal respiratory effort. No accessory muscle use.  Cardiovascular: tachycardic, reg rhythm, no murmurs / rubs / gallops. No extremity edema. 2+ pedal pulses. No carotid  bruits.  Abdomen: no tenderness, no masses palpated. No hepatosplenomegaly. Bowel sounds positive.  Musculoskeletal: no clubbing / cyanosis. No joint deformity upper and lower extremities. Good ROM, no contractures. Normal muscle tone.  Skin: no rashes, lesions, ulcers. No induration. Right subclavian portacath Neurologic:  Slightly somnolent but easily arousable Psychiatric: Normal judgment and insight.     Labs on Admission: I have personally reviewed following labs and imaging studies  CBC: Recent Labs  Lab 03/18/17 1147  WBC 4.4  NEUTROABS 2.4  HGB 8.7*  HCT 25.7*  MCV 89  PLT 90*   Basic Metabolic Panel: Recent Labs  Lab 03/18/17 1147 03/18/17 1642  NA 139 130*  K 3.5 3.6  CL 101 106  CO2 27 23  GLUCOSE 82 96  BUN 23* 25*  CREATININE 2.2* 1.83*  CALCIUM 13.7* 12.5*   GFR: Estimated Creatinine Clearance: 32 mL/min (A) (by C-G formula based on SCr of 1.83 mg/dL (H)). Liver Function Tests: Recent Labs  Lab 03/18/17 1147  AST 52*  ALT 23  ALKPHOS 31  BILITOT 0.70  PROT 13.2*  ALBUMIN 2.9*   No results for input(s): LIPASE, AMYLASE in the last 168 hours. No results for input(s): AMMONIA in the last 168 hours. Coagulation Profile: No results for input(s): INR, PROTIME in the last 168 hours. Cardiac Enzymes: No results for input(s): CKTOTAL, CKMB, CKMBINDEX, TROPONINI in the last 168 hours. BNP (last 3 results) No results for input(s): PROBNP in the last 8760 hours. HbA1C: No results for input(s): HGBA1C in the last 72 hours. CBG: No results for input(s): GLUCAP in the last 168 hours. Lipid Profile: No results for input(s): CHOL, HDL, LDLCALC, TRIG, CHOLHDL, LDLDIRECT in the last 72 hours. Thyroid Function Tests: No results for input(s): TSH, T4TOTAL, FREET4, T3FREE, THYROIDAB in the last 72 hours. Anemia Panel: No results for input(s): VITAMINB12, FOLATE, FERRITIN, TIBC, IRON, RETICCTPCT in the last 72 hours. Urine analysis:    Component Value  Date/Time   COLORURINE YELLOW 06/03/2014 0300   APPEARANCEUR CLEAR 06/03/2014 0300   LABSPEC 1.015 02/04/2015 1149   PHURINE 7.5 06/03/2014 0300   GLUCOSEU NEGATIVE 06/03/2014 0300   HGBUR TRACE (A) 06/03/2014 0300   BILIRUBINUR NEGATIVE 06/03/2014 0300   KETONESUR NEGATIVE 06/03/2014 0300   PROTEINUR NEGATIVE 06/03/2014 0300   UROBILINOGEN 0.2 02/04/2015 1149   NITRITE NEGATIVE 06/03/2014 0300   LEUKOCYTESUR SMALL (A) 06/03/2014 0300    Radiological Exams on Admission: Dg Chest 2 View  Result Date: 03/18/2017 CLINICAL DATA:  Initial evaluation for acute cough. History of multiple myeloma. EXAM: CHEST  2 VIEW COMPARISON:  Prior radiograph from 06/03/2014. FINDINGS: Access right-sided Port-A-Cath in place with tip overlying the cavoatrial junction. Transverse heart size stable in size and contour, and remains within normal limits. Mild fullness at the right paratracheal stripe, similar to previous, which may reflect persistent adenopathy. Mediastinal silhouette otherwise unremarkable. Aortic atherosclerosis. Lungs normally inflated. No focal infiltrates. No pulmonary edema or pleural effusion. No pneumothorax. No acute osseus abnormality para sequelae of prior vertebral augmentation noted at T12. IMPRESSION: 1. Fullness at the right paratracheal stripe, suggestive of underlying adenopathy, similar to previous exams. 2. No other active cardiopulmonary disease. 3. Aortic atherosclerosis. Electronically Signed   By: Jeannine Boga M.D.   On: 03/18/2017 17:20    EKG: Independently reviewed. Tachycardic, normal intervals  Assessment/Plan Active Problems:   Multiple myeloma (HCC)   Hypercalcemia   Hypercalcemia of malignancy   Normocytic anemia   AKI (acute kidney injury) ([place])   # Hypercalcemia of malignancy - baseline wnl, corrected ca 14.6 today, down to 13.4 after fluids. Is s/p calcitonin and zoledronic acid as well. Symptomatic. - NS @ 200 ml/hr - appreciate oncology recs in  AM - f/u PTH - daily cmp - telemetry first 24 hours, d/c as Ca improves. Initial ECG w/o rhythm or interval disturbances  # AKI - likely prerenal 2/2 decreased PO. Cr 2.2 today, down to 1.83 later today after IV fluids - IV fluids  as above, daily Cr  # Cough - started on tamifly by oncology. CXR negative for infection. - check rapid flu - continue tamiflu until results available.  # Anemia, normocytic - likely of chronic disease, but worsening recently - check iron panel and hemoccult and LDH  # hypomagenesemia - AM Mg, continue home Mg  DVT prophylaxis: lovenox Code Status: full Family Communication: husband nabilah davoli [phone] Disposition Plan: home Consults called: oncolocy Admission status: tele   Desma Maxim MD [facility] Pager [phone]  If 7PM-7AM, please contact night-coverage [email or web address] Password [email or web address]  03/18/2017, 11:03 PM

## 2017-03-18 NOTE — ED Triage Notes (Signed)
Pt with c/o "sleeping a lot"-see notes from oncology today-pt NAD-presents to triage in w/c

## 2017-03-18 NOTE — ED Notes (Signed)
Patient transported to X-ray 

## 2017-03-19 ENCOUNTER — Encounter (HOSPITAL_COMMUNITY): Payer: Self-pay

## 2017-03-19 DIAGNOSIS — C9000 Multiple myeloma not having achieved remission: Secondary | ICD-10-CM

## 2017-03-19 DIAGNOSIS — N179 Acute kidney failure, unspecified: Secondary | ICD-10-CM

## 2017-03-19 DIAGNOSIS — D649 Anemia, unspecified: Secondary | ICD-10-CM

## 2017-03-19 LAB — CBC
HEMATOCRIT: 22.7 % — AB (ref 36.0–46.0)
Hemoglobin: 7.5 g/dL — ABNORMAL LOW (ref 12.0–15.0)
MCH: 29 pg (ref 26.0–34.0)
MCHC: 33 g/dL (ref 30.0–36.0)
MCV: 87.6 fL (ref 78.0–100.0)
PLATELETS: 82 10*3/uL — AB (ref 150–400)
RBC: 2.59 MIL/uL — ABNORMAL LOW (ref 3.87–5.11)
RDW: 14.8 % (ref 11.5–15.5)
WBC: 3.8 10*3/uL — AB (ref 4.0–10.5)

## 2017-03-19 LAB — COMPREHENSIVE METABOLIC PANEL
ALBUMIN: 2.3 g/dL — AB (ref 3.5–5.0)
ALK PHOS: 24 U/L — AB (ref 38–126)
ALT: 17 U/L (ref 14–54)
AST: 38 U/L (ref 15–41)
Anion gap: 3 — ABNORMAL LOW (ref 5–15)
BILIRUBIN TOTAL: 0.5 mg/dL (ref 0.3–1.2)
BUN: 24 mg/dL — ABNORMAL HIGH (ref 6–20)
CALCIUM: 10.5 mg/dL — AB (ref 8.9–10.3)
CO2: 24 mmol/L (ref 22–32)
CREATININE: 1.8 mg/dL — AB (ref 0.44–1.00)
Chloride: 107 mmol/L (ref 101–111)
GFR calc non Af Amer: 29 mL/min — ABNORMAL LOW (ref >60)
GFR, EST AFRICAN AMERICAN: 34 mL/min — AB (ref >60)
GLUCOSE: 97 mg/dL (ref 65–99)
Potassium: 3.9 mmol/L (ref 3.5–5.1)
SODIUM: 134 mmol/L — AB (ref 135–145)
TOTAL PROTEIN: 10.5 g/dL — AB (ref 6.5–8.1)

## 2017-03-19 LAB — INFLUENZA A AND B
Influenza A Ag, EIA: NEGATIVE
Influenza B Ag, EIA: NEGATIVE

## 2017-03-19 LAB — FOLATE: Folate: 14.1 ng/mL (ref >5.9)

## 2017-03-19 LAB — IRON AND TIBC
Iron: 42 ug/dL (ref 28–170)
Iron: 51 ug/dL (ref 28–170)
SATURATION RATIOS: 25 % (ref 10.4–31.8)
SATURATION RATIOS: 20 % (ref 10.4–31.8)
TIBC: 206 ug/dL — ABNORMAL LOW (ref 250–450)
TIBC: 204 ug/dL — ABNORMAL LOW (ref 250–450)
UIBC: 153 ug/dL
UIBC: 164 ug/dL

## 2017-03-19 LAB — CK: CK TOTAL: 54 U/L (ref 38–234)

## 2017-03-19 LAB — RETICULOCYTES
RBC.: 2.62 MIL/uL — AB (ref 3.87–5.11)
RETIC CT PCT: 0.6 % (ref 0.4–3.1)
Retic Count, Absolute: 15.7 10*3/uL — ABNORMAL LOW (ref 19.0–186.0)

## 2017-03-19 LAB — CREATININE, SERUM
Creatinine, Ser: 1.84 mg/dL — ABNORMAL HIGH (ref 0.44–1.00)
GFR, EST AFRICAN AMERICAN: 33 mL/min — AB (ref >60)
GFR, EST NON AFRICAN AMERICAN: 28 mL/min — AB (ref >60)

## 2017-03-19 LAB — OCCULT BLOOD X 1 CARD TO LAB, STOOL: Fecal Occult Bld: POSITIVE — AB

## 2017-03-19 LAB — INFLUENZA PANEL BY PCR (TYPE A & B)
Influenza A By PCR: NEGATIVE
Influenza B By PCR: NEGATIVE

## 2017-03-19 LAB — FERRITIN
Ferritin: 99 ng/mL (ref 11–307)
Ferritin: 183 ng/mL (ref 11–307)

## 2017-03-19 LAB — LACTATE DEHYDROGENASE: LDH: 296 U/L — ABNORMAL HIGH (ref 98–192)

## 2017-03-19 LAB — VITAMIN B12: VITAMIN B 12: 835 pg/mL (ref 180–914)

## 2017-03-19 LAB — MAGNESIUM: Magnesium: 1.4 mg/dL — ABNORMAL LOW (ref 1.7–2.4)

## 2017-03-19 LAB — HIV ANTIBODY (ROUTINE TESTING W REFLEX): HIV Screen 4th Generation wRfx: NONREACTIVE

## 2017-03-19 MED ORDER — MAGNESIUM SULFATE IN D5W 1-5 GM/100ML-% IV SOLN
1.0000 g | Freq: Once | INTRAVENOUS | Status: AC
  Administered 2017-03-19: 1 g via INTRAVENOUS
  Filled 2017-03-19: qty 100

## 2017-03-19 MED ORDER — FUROSEMIDE 10 MG/ML IJ SOLN
20.0000 mg | Freq: Four times a day (QID) | INTRAMUSCULAR | Status: AC
  Administered 2017-03-19 (×2): 20 mg via INTRAVENOUS
  Filled 2017-03-19 (×2): qty 2

## 2017-03-19 MED ORDER — POTASSIUM CHLORIDE CRYS ER 20 MEQ PO TBCR
40.0000 meq | EXTENDED_RELEASE_TABLET | Freq: Once | ORAL | Status: AC
  Administered 2017-03-19: 40 meq via ORAL
  Filled 2017-03-19: qty 2

## 2017-03-19 MED ORDER — SODIUM CHLORIDE 0.9 % IV SOLN
INTRAVENOUS | Status: DC
  Administered 2017-03-19 – 2017-03-20 (×3): via INTRAVENOUS

## 2017-03-19 MED ORDER — GUAIFENESIN-DM 100-10 MG/5ML PO SYRP
5.0000 mL | ORAL_SOLUTION | ORAL | Status: DC | PRN
  Administered 2017-03-20 – 2017-03-21 (×3): 5 mL via ORAL
  Filled 2017-03-19 (×3): qty 10

## 2017-03-19 MED ORDER — ACETAMINOPHEN 325 MG PO TABS
650.0000 mg | ORAL_TABLET | Freq: Four times a day (QID) | ORAL | Status: DC | PRN
  Administered 2017-03-19: 650 mg via ORAL
  Filled 2017-03-19: qty 2

## 2017-03-19 NOTE — Plan of Care (Signed)
  Safety: Ability to remain free from injury will improve 03/19/2017 2231 - Progressing by Mickie Kay, RN   Pain Managment: General experience of comfort will improve 03/19/2017 2231 - Completed/Met by Mickie Kay, RN

## 2017-03-19 NOTE — Progress Notes (Signed)
Upon change of shift reporting, night shift RN told day RN that patient's husband could be rude and abrasive towards staff. Eulas Post, RN

## 2017-03-19 NOTE — Progress Notes (Addendum)
@IPLOG @        PROGRESS NOTE                                                                                                                                                                                                             Patient Demographics:    Barbara Cook, is a 61 y.o. female, DOB - [birth year], ZRA:[id number]  Admit date - 03/18/2017   Admitting Physician Donne Hazel, MD  Outpatient Primary MD for the patient is [name], MD  LOS - 1  Chief Complaint  Patient presents with  . Fatigue       Brief Narrative  Barbara Cook is a 61 y.o. female with medical history significant for multiple myeloma presents w/ weakness. Has had about a week of progressively worsening weakness and fatigue. No headache or abdominal pain. Oral intake has been decreased because of this. No fevers but does have slight dry cough past few days. No diarrhea. Occasional nausea/vomiting unchanged from baseline.  Presented to oncologist where labs were checked revealing aki and hypercalcemia. Fluid bolus as well as calcitonin and zoledronic acid were given, and was referred to ED for admission.      Subjective:    Barbara Cook today has, No headache, No chest pain, No abdominal pain - No Nausea, No new weakness tingling or numbness, No Cough - SOB.     Assessment  & Plan :     1.  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  She has already received calcitonin along with zoledronic acid at oncology office, continue hydration with IV fluids along with Lasix, monitor calcium levels, trend improving.  PTH levels ordered upon admission are pending.  2.  Anemia of chronic disease due to underlying multiple myeloma, worse due to dilution from IV fluids, check anemia panel monitor trend.  Type screen.  3.  GERD.  On PPI.  4.  ARF.  Due to hypercalcemia and dehydration, hydrate and monitor avoid nephrotoxins.  5.  Low-grade fever.  Chest x-ray stable, influenza PCR  negative, DC Tamiflu, check UA.  6.  Hypomagnesemia.  Replace and recheck.   When I came to see the patient in the morning I was told by the nursing staff that the husband had been very rude overnight to the staff for no clear reason, and not the door and introduced myself, husband for no reason started shouting that I am interrupting the patient and her breakfast and that I should have waited for her until she completed her  breakfast and morning coffee.  I politely told him that unfortunately in the hospital we see the patient when the schedule allows.  Patient herself was apologetic.    Diet : Diet regular Room service appropriate? Yes; Fluid consistency: Thin    Family Communication  :  Husband  Code Status :  Full  Disposition Plan  :  TBD  Consults  :  None  Procedures  :      DVT Prophylaxis  :  Lovenox   Lab Results  Component Value Date   PLT 82 (L) 03/18/2017    Inpatient Medications  Scheduled Meds: . enoxaparin (LOVENOX) injection  40 mg Subcutaneous QHS  . furosemide  20 mg Intravenous Q6H  . magnesium oxide  400 mg Oral BID  . oseltamivir  30 mg Oral BID  . pantoprazole  40 mg Oral Daily  . potassium chloride  40 mEq Oral Once   Continuous Infusions: . sodium chloride     PRN Meds:.acetaminophen, hyoscyamine, ondansetron, sodium chloride flush  Antibiotics  :    Anti-infectives (From admission, onward)   Start     Dose/Rate Route Frequency Ordered Stop   03/18/17 2315  oseltamivir (TAMIFLU) capsule 30 mg     30 mg Oral 2 times daily 03/18/17 2304 03/23/17 2159         Objective:   Vitals:   03/18/17 1809 03/18/17 2038 03/19/17 0403 03/19/17 0552  BP: 133/70 132/75 125/69   Pulse: (!) 106 (!) 112 (!) 106   Resp: 20 20 20    Temp:  98.8 F (37.1 C) (!) 100.5 F (38.1 C)   TempSrc:  Oral    SpO2: 94% 97% 93%   Weight:  76.9 kg (169 lb 8 oz)  78.3 kg (172 lb 9.9 oz)  Height:  5' 4"  (1.626 m)  5' 4"  (1.626 m)    Wt Readings from Last 3  Encounters:  03/19/17 78.3 kg (172 lb 9.9 oz)  03/18/17 76.9 kg (169 lb 8 oz)  03/03/17 77.7 kg (171 lb 4 oz)     Intake/Output Summary (Last 24 hours) at 03/19/2017 1321 Last data filed at 03/19/2017 0900 Gross per 24 hour  Intake 810 ml  Output -  Net 810 ml     Physical Exam  Awake Alert, Oriented X 3, No new F.N deficits, Normal affect [place].AT,PERRAL Supple Neck,No JVD, No cervical lymphadenopathy appriciated.  Symmetrical Chest wall movement, Good air movement bilaterally, CTAB RRR,No Gallops,Rubs or new Murmurs, No Parasternal Heave +ve B.Sounds, Abd Soft, No tenderness, No organomegaly appriciated, No rebound - guarding or rigidity. No Cyanosis, Clubbing or edema, No new Rash or bruise       Data Review:    CBC Recent Labs  Lab 03/18/17 1147 03/18/17 2344  WBC 4.4 3.8*  HGB 8.7* 7.5*  HCT 25.7* 22.7*  PLT 90* 82*  MCV 89 87.6  MCH 30.2 29.0  MCHC 33.9 33.0  RDW 14.1 14.8  LYMPHSABS 1.4  --   EOSABS 0.0  --   BASOSABS 0.0  --     Chemistries  Recent Labs  Lab 03/18/17 1147 03/18/17 1642 03/18/17 2344 03/19/17 0432  NA 139 130*  --  134*  K 3.5 3.6  --  3.9  CL 101 106  --  107  CO2 27 23  --  24  GLUCOSE 82 96  --  97  BUN 23* 25*  --  24*  CREATININE 2.2* 1.83* 1.84* 1.80*  CALCIUM 13.7* 12.5*  --  10.5*  MG  --   --   --  1.4*  AST 52*  --   --  38  ALT 23  --   --  17  ALKPHOS 31  --   --  24*  BILITOT 0.70  --   --  0.5   ------------------------------------------------------------------------------------------------------------------ No results for input(s): CHOL, HDL, LDLCALC, TRIG, CHOLHDL, LDLDIRECT in the last 72 hours.  No results found for: HGBA1C ------------------------------------------------------------------------------------------------------------------ No results for input(s): TSH, T4TOTAL, T3FREE, THYROIDAB in the last 72 hours.  Invalid input(s):  FREET3 ------------------------------------------------------------------------------------------------------------------ Recent Labs    03/18/17 2344  FERRITIN 99  TIBC 206*  IRON 42    Coagulation profile No results for input(s): INR, PROTIME in the last 168 hours.  No results for input(s): DDIMER in the last 72 hours.  Cardiac Enzymes No results for input(s): CKMB, TROPONINI, MYOGLOBIN in the last 168 hours.  Invalid input(s): CK ------------------------------------------------------------------------------------------------------------------ No results found for: BNP  Micro Results No results found for this or any previous visit (from the past 240 hour(s)).  Radiology Reports Dg Chest 2 View  Result Date: 03/18/2017 CLINICAL DATA:  Initial evaluation for acute cough. History of multiple myeloma. EXAM: CHEST  2 VIEW COMPARISON:  Prior radiograph from 06/03/2014. FINDINGS: Access right-sided Port-A-Cath in place with tip overlying the cavoatrial junction. Transverse heart size stable in size and contour, and remains within normal limits. Mild fullness at the right paratracheal stripe, similar to previous, which may reflect persistent adenopathy. Mediastinal silhouette otherwise unremarkable. Aortic atherosclerosis. Lungs normally inflated. No focal infiltrates. No pulmonary edema or pleural effusion. No pneumothorax. No acute osseus abnormality para sequelae of prior vertebral augmentation noted at T12. IMPRESSION: 1. Fullness at the right paratracheal stripe, suggestive of underlying adenopathy, similar to previous exams. 2. No other active cardiopulmonary disease. 3. Aortic atherosclerosis. Electronically Signed   By: Jeannine Boga M.D.   On: 03/18/2017 17:20    Time Spent in minutes  30   Lala Lund M.D on 03/19/2017 at 1:21 PM  Between 7am to 7pm - Pager - [phone] ( page via [email or web address], text pages only, please mention full 10 digit call back number). After 7pm  go to [email or web address] - password [facility]

## 2017-03-20 ENCOUNTER — Inpatient Hospital Stay (HOSPITAL_COMMUNITY): Payer: 59

## 2017-03-20 LAB — COMPREHENSIVE METABOLIC PANEL
ALT: 39 U/L (ref 14–54)
AST: 71 U/L — ABNORMAL HIGH (ref 15–41)
Albumin: 2.3 g/dL — ABNORMAL LOW (ref 3.5–5.0)
Alkaline Phosphatase: 25 U/L — ABNORMAL LOW (ref 38–126)
Anion gap: 3 — ABNORMAL LOW (ref 5–15)
BUN: 21 mg/dL — ABNORMAL HIGH (ref 6–20)
CHLORIDE: 110 mmol/L (ref 101–111)
CO2: 22 mmol/L (ref 22–32)
CREATININE: 1.83 mg/dL — AB (ref 0.44–1.00)
Calcium: 9.5 mg/dL (ref 8.9–10.3)
GFR calc Af Amer: 33 mL/min — ABNORMAL LOW (ref >60)
GFR calc non Af Amer: 28 mL/min — ABNORMAL LOW (ref >60)
Glucose, Bld: 90 mg/dL (ref 65–99)
Potassium: 3.8 mmol/L (ref 3.5–5.1)
Sodium: 135 mmol/L (ref 135–145)
Total Bilirubin: 0.5 mg/dL (ref 0.3–1.2)
Total Protein: 10.6 g/dL — ABNORMAL HIGH (ref 6.5–8.1)

## 2017-03-20 LAB — URINE CULTURE

## 2017-03-20 LAB — CBC
HCT: 20.7 % — ABNORMAL LOW (ref 36.0–46.0)
Hemoglobin: 6.9 g/dL — CL (ref 12.0–15.0)
MCH: 29.2 pg (ref 26.0–34.0)
MCHC: 33.3 g/dL (ref 30.0–36.0)
MCV: 87.7 fL (ref 78.0–100.0)
PLATELETS: 67 10*3/uL — AB (ref 150–400)
RBC: 2.36 MIL/uL — ABNORMAL LOW (ref 3.87–5.11)
RDW: 15.1 % (ref 11.5–15.5)
WBC: 5.1 10*3/uL (ref 4.0–10.5)

## 2017-03-20 LAB — HEMOGLOBIN AND HEMATOCRIT, BLOOD
HEMATOCRIT: 21.7 % — AB (ref 36.0–46.0)
Hemoglobin: 7.4 g/dL — ABNORMAL LOW (ref 12.0–15.0)

## 2017-03-20 LAB — PARATHYROID HORMONE, INTACT (NO CA): PTH: 15 pg/mL (ref 15–65)

## 2017-03-20 LAB — MAGNESIUM: Magnesium: 1.7 mg/dL (ref 1.7–2.4)

## 2017-03-20 MED ORDER — HEPARIN SOD (PORK) LOCK FLUSH 100 UNIT/ML IV SOLN
500.0000 [IU] | INTRAVENOUS | Status: DC | PRN
  Administered 2017-03-22: 500 [IU]
  Filled 2017-03-20: qty 5

## 2017-03-20 MED ORDER — HEPARIN SOD (PORK) LOCK FLUSH 100 UNIT/ML IV SOLN
500.0000 [IU] | INTRAVENOUS | Status: DC
  Filled 2017-03-20: qty 5

## 2017-03-20 MED ORDER — SODIUM CHLORIDE 0.9 % IV SOLN: Freq: Once | INTRAVENOUS | Status: DC

## 2017-03-20 MED ORDER — FUROSEMIDE 10 MG/ML IJ SOLN
60.0000 mg | Freq: Once | INTRAMUSCULAR | Status: AC
  Administered 2017-03-20: 60 mg via INTRAVENOUS
  Filled 2017-03-20: qty 6

## 2017-03-20 MED ORDER — FUROSEMIDE 10 MG/ML IJ SOLN: 20.0000 mg | Freq: Once | INTRAMUSCULAR | Status: DC

## 2017-03-20 NOTE — Plan of Care (Signed)
  Clinical Measurements: Diagnostic test results will improve 03/20/2017 2313 - Progressing by Mickie Kay, RN

## 2017-03-20 NOTE — Progress Notes (Signed)
CRITICAL VALUE ALERT  Critical Value: Hgb 6.9  Date & Time Notied:  03/20/17 0453  Provider Notified: Threasa Alpha  Orders Received/Actions taken: orders recieved

## 2017-03-20 NOTE — Evaluation (Addendum)
Physical Therapy Evaluation Patient Details Name: Barbara Cook MRN: [id number] DOB: [birth year] Today's Date: 03/20/2017   History of Present Illness  61 yo female admitted with hypercalcemia, weaknss, anemia. Hx of multiple myeloma, N/M d/o, arthritis, polymyalgia rheumatica     Clinical Impression  Called by RN-MD wants pt to be seen by PT despite hgb levels. On eval, pt required Mod assist for mobility. She walked ~100 feet with a RW. Pt is weak and at risk for falls. Pt presents with general weakness, decreased activity tolerance, and impaired gait and balance. Discussed d/c plan with pt and husband-plan is for d/c home. Informed husband of need for 24 hour supervision/assist,+1 assist at all times, and use of RW for ambulation safety. Will continue to follow. Do not feel pt is safe to d/c home today. Also, pt had a nose bleed during session-made RN aware.    Follow Up Recommendations Home health PT;Supervision/Assistance - 24 hour    Equipment Recommendations  3in1 (PT)    Recommendations for Other Services       Precautions / Restrictions Precautions Precautions: Fall Restrictions Weight Bearing Restrictions: No      Mobility  Bed Mobility               General bed mobility comments: oob in recliner  Transfers Overall transfer level: Needs assistance Equipment used: Rolling walker (2 wheeled) Transfers: Sit to/from Stand Sit to Stand: Min guard         General transfer comment: close guard for safety. VCs safety, technique, hand placement  Ambulation/Gait Ambulation/Gait assistance: Min assist Ambulation Distance (Feet): 100 Feet Assistive device: Rolling walker (2 wheeled) Gait Pattern/deviations: Step-through pattern;Decreased stride length     General Gait Details: Assist to stabilize pt and maneuver walker safely. LEs buckled x 1. Cues for safety, distance from RW. Pt is at risk for falls.   Stairs            Wheelchair Mobility     Modified Rankin (Stroke Patients Only)       Balance Overall balance assessment: Needs assistance         Standing balance support: Bilateral upper extremity supported Standing balance-Leahy Scale: Poor                               Pertinent Vitals/Pain Pain Assessment: Faces Faces Pain Scale: No hurt    Home Living Family/patient expects to be discharged to:: Private residence Living Arrangements: Spouse/significant other Available Help at Discharge: Family Type of Home: House Home Access: Stairs to enter Entrance Stairs-Rails: Right Entrance Stairs-Number of Steps: 4 Home Layout: One level Home Equipment: Environmental consultant - 2 wheels      Prior Function Level of Independence: Independent               Hand Dominance        Extremity/Trunk Assessment   Upper Extremity Assessment Upper Extremity Assessment: Generalized weakness    Lower Extremity Assessment Lower Extremity Assessment: Generalized weakness    Cervical / Trunk Assessment Cervical / Trunk Assessment: Normal  Communication   Communication: No difficulties  Cognition Arousal/Alertness: Awake/alert Behavior During Therapy: WFL for tasks assessed/performed Overall Cognitive Status: Impaired/Different from baseline Area of Impairment: Problem solving                             Problem Solving: Slow processing;Decreased initiation;Requires verbal cues;Requires tactile cues  General Comments      Exercises  SLR, 10 reps, AAROM, supine  Quad sets, 10 reps, AROM, seated LAQ, 10 reps, AROM, seated Marching, 10 reps, AROM, seated   Assessment/Plan    PT Assessment Patient needs continued PT services  PT Problem List Decreased mobility;Decreased strength;Decreased activity tolerance;Decreased balance;Decreased knowledge of use of DME;Decreased cognition       PT Treatment Interventions DME instruction;Gait training;Functional mobility training;Therapeutic  activities;Therapeutic exercise;Balance training;Patient/family education    PT Goals (Current goals can be found in the Care Plan section)  Acute Rehab PT Goals Patient Stated Goal: to get stronger. home.  PT Goal Formulation: With patient/family Time For Goal Achievement: 04/03/17 Potential to Achieve Goals: Good    Frequency Min 3X/week   Barriers to discharge        Co-evaluation               AM-PAC PT "6 Clicks" Daily Activity  Outcome Measure Difficulty turning over in bed (including adjusting bedclothes, sheets and blankets)?: Unable Difficulty moving from lying on back to sitting on the side of the bed? : Unable Difficulty sitting down on and standing up from a chair with arms (e.g., wheelchair, bedside commode, etc,.)?: Unable Help needed moving to and from a bed to chair (including a wheelchair)?: A Little Help needed walking in hospital room?: A Little Help needed climbing 3-5 steps with a railing? : A Lot 6 Click Score: 11    End of Session Equipment Utilized During Treatment: Gait belt Activity Tolerance: Patient limited by fatigue Patient left: in chair;with call bell/phone within reach;with family/visitor present   PT Visit Diagnosis: Muscle weakness (generalized) (M62.81);Difficulty in walking, not elsewhere classified (R26.2)    Time: 1435-1510 PT Time Calculation (min) (ACUTE ONLY): 35 min   Charges:   PT Evaluation $PT Eval Moderate Complexity: 1 Mod PT Treatments $Gait Training: 8-22 mins   PT G Codes:          Weston Anna, MPT Pager: [phone]

## 2017-03-20 NOTE — Progress Notes (Signed)
@IPLOG @        PROGRESS NOTE                                                                                                                                                                                                             Patient Demographics:    Venezia Sargeant, is a 61 y.o. female, DOB - 1955-09-05, JTT:[id number]  Admit date - 03/18/2017   Admitting Physician Donne Hazel, MD  Outpatient Primary MD for the patient is [name], MD  LOS - 2  Chief Complaint  Patient presents with  . Fatigue       Brief Narrative  CORIN FORMISANO is a 61 y.o. female with medical history significant for multiple myeloma presents w/ weakness. Has had about a week of progressively worsening weakness and fatigue. No headache or abdominal pain. Oral intake has been decreased because of this. No fevers but does have slight dry cough past few days. No diarrhea. Occasional nausea/vomiting unchanged from baseline.  Presented to oncologist where labs were checked revealing aki and hypercalcemia. Fluid bolus as well as calcitonin and zoledronic acid were given, and was referred to ED for admission.      Subjective:   Patient in bed, appears comfortable, denies any headache, no fever, no chest pain or pressure, no shortness of breath , no abdominal pain. No focal weakness.    Assessment  & Plan :     1.  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  She has already received calcitonin along with zoledronic acid at oncology office, continue hydration with IV fluids along with Lasix, monitor calcium levels, trend improving and close to NML ( peaked at >15).  PTH levels ordered upon admission are pending.  2.  Thrombocytopenia with Anemia of chronic disease due to underlying multiple myeloma -  worse due to dilution from IV fluids, stable anemia panel monitor trend.  Type screen. Avoid transfusion if HB > 7.  3.  GERD.  On PPI.  4.  ARF.  Due to hypercalcemia and  dehydration, hydrate and monitor avoid nephrotoxins. Check Renal US.  5.  Low-grade fever.  Chest x-ray stable, influenza PCR negative, DC Tamiflu, check UA.  6.  Hypomagnesemia.  Replace and stable.   03-19-17   -  When I came to see the patient in the morning I was told by the nursing staff that the husband had been very rude overnight to the staff for no clear reason, and not the door and introduced myself, husband for  no reason started shouting that I am interrupting the patient and her breakfast and that I should have waited for her until she completed her breakfast and morning coffee.  I politely told him that unfortunately in the hospital we see the patient when the schedule allows.  Patient herself was apologetic.    Diet : Diet regular Room service appropriate? Yes; Fluid consistency: Thin    Family Communication  :  Husband  Code Status :  Full  Disposition Plan  :  TBD  Consults  :  None  Procedures  :    Renal US  -   DVT Prophylaxis  :  Lovenox   Lab Results  Component Value Date   PLT 67 (L) 03/20/2017    Inpatient Medications  Scheduled Meds: . enoxaparin (LOVENOX) injection  40 mg Subcutaneous QHS  . furosemide  20 mg Intravenous Once  . heparin lock flush  500 Units Intracatheter Q30 days  . magnesium oxide  400 mg Oral BID  . pantoprazole  40 mg Oral Daily   Continuous Infusions:  PRN Meds:.acetaminophen, guaiFENesin-dextromethorphan, heparin lock flush **AND** heparin lock flush, hyoscyamine, ondansetron, sodium chloride flush  Antibiotics  :    Anti-infectives (From admission, onward)   Start     Dose/Rate Route Frequency Ordered Stop   03/18/17 2315  oseltamivir (TAMIFLU) capsule 30 mg  Status:  Discontinued     30 mg Oral 2 times daily 03/18/17 2304 03/19/17 1327         Objective:   Vitals:   03/19/17 1427 03/19/17 2128 03/20/17 0429 03/20/17 0442  BP: 124/69 (!) 141/72 (!) 120/57   Pulse: 94 (!) 109 (!) 104   Resp: 16 16 20    Temp:  (!) 97.5 F (36.4 C) (!) 101 F (38.3 C) 98.7 F (37.1 C)   TempSrc: Oral Oral Oral   SpO2: 100% 97% 96%   Weight:    78.3 kg (172 lb 9.9 oz)  Height:        Wt Readings from Last 3 Encounters:  03/20/17 78.3 kg (172 lb 9.9 oz)  03/18/17 76.9 kg (169 lb 8 oz)  03/03/17 77.7 kg (171 lb 4 oz)     Intake/Output Summary (Last 24 hours) at 03/20/2017 1435 Last data filed at 03/20/2017 1130 Gross per 24 hour  Intake 1793.75 ml  Output 2200 ml  Net -406.25 ml     Physical Exam  Awake Alert, Oriented X 3, No new F.N deficits, Normal affect .AT,PERRAL Supple Neck,No JVD, No cervical lymphadenopathy appriciated.  Symmetrical Chest wall movement, Good air movement bilaterally, CTAB RRR,No Gallops, Rubs or new Murmurs, No Parasternal Heave +ve B.Sounds, Abd Soft, No tenderness, No organomegaly appriciated, No rebound - guarding or rigidity. No Cyanosis, Clubbing or edema, No new Rash or bruise     Data Review:    CBC Recent Labs  Lab 03/18/17 1147 03/18/17 2344 03/20/17 0409 03/20/17 1346  WBC 4.4 3.8* 5.1  --   HGB 8.7* 7.5* 6.9* 7.4*  HCT 25.7* 22.7* 20.7* 21.7*  PLT 90* 82* 67*  --   MCV 89 87.6 87.7  --   MCH 30.2 29.0 29.2  --   MCHC 33.9 33.0 33.3  --   RDW 14.1 14.8 15.1  --   LYMPHSABS 1.4  --   --   --   EOSABS 0.0  --   --   --   BASOSABS 0.0  --   --   --  Chemistries  Recent Labs  Lab 03/18/17 1147 03/18/17 1642 03/18/17 2344 03/19/17 0432 03/20/17 0409  NA 139 130*  --  134* 135  K 3.5 3.6  --  3.9 3.8  CL 101 106  --  107 110  CO2 27 23  --  24 22  GLUCOSE 82 96  --  97 90  BUN 23* 25*  --  24* 21*  CREATININE 2.2* 1.83* 1.84* 1.80* 1.83*  CALCIUM 13.7* 12.5*  --  10.5* 9.5  MG  --   --   --  1.4* 1.7  AST 52*  --   --  38 71*  ALT 23  --   --  17 39  ALKPHOS 31  --   --  24* 25*  BILITOT 0.70  --   --  0.5 0.5    ------------------------------------------------------------------------------------------------------------------ No results for input(s): CHOL, HDL, LDLCALC, TRIG, CHOLHDL, LDLDIRECT in the last 72 hours.  No results found for: HGBA1C ------------------------------------------------------------------------------------------------------------------ No results for input(s): TSH, T4TOTAL, T3FREE, THYROIDAB in the last 72 hours.  Invalid input(s): FREET3 ------------------------------------------------------------------------------------------------------------------ Recent Labs    03/18/17 2344 03/19/17 1400  VITAMINB12  --  835  FOLATE  --  14.1  FERRITIN 99 183  TIBC 206* 204*  IRON 42 51  RETICCTPCT  --  0.6    Coagulation profile No results for input(s): INR, PROTIME in the last 168 hours.  No results for input(s): DDIMER in the last 72 hours.  Cardiac Enzymes No results for input(s): CKMB, TROPONINI, MYOGLOBIN in the last 168 hours.  Invalid input(s): CK ------------------------------------------------------------------------------------------------------------------ No results found for: BNP  Micro Results Recent Results (from the past 240 hour(s))  Influenza A and B     Status: None   Collection Time: 03/18/17  1:14 PM  Result Value Ref Range Status   Influenza A Ag, EIA Negative Negative Final   Influenza B Ag, EIA Negative Negative Final   Influenza Comment See note  Final   Please note: Comment  Final    Comment: Because a negative rapid influenza antigen EIA test may not rule-out influenza viral infection, the CDC and other public health agencies have recommended that confirmatory testing using viral culture or nucleic acid amplification should be considered when clinical signs and symptoms are consistent with influenza-like illness, as well as to assist in detecting other respiratory viruses that can produce similar clinical symptoms. (Reference:  https://lee-mcguire.com/ guidance_ridt.pdf)     Radiology Reports Dg Chest 2 View  Result Date: 03/18/2017 CLINICAL DATA:  Initial evaluation for acute cough. History of multiple myeloma. EXAM: CHEST  2 VIEW COMPARISON:  Prior radiograph from 06/03/2014. FINDINGS: Access right-sided Port-A-Cath in place with tip overlying the cavoatrial junction. Transverse heart size stable in size and contour, and remains within normal limits. Mild fullness at the right paratracheal stripe, similar to previous, which may reflect persistent adenopathy. Mediastinal silhouette otherwise unremarkable. Aortic atherosclerosis. Lungs normally inflated. No focal infiltrates. No pulmonary edema or pleural effusion. No pneumothorax. No acute osseus abnormality para sequelae of prior vertebral augmentation noted at T12. IMPRESSION: 1. Fullness at the right paratracheal stripe, suggestive of underlying adenopathy, similar to previous exams. 2. No other active cardiopulmonary disease. 3. Aortic atherosclerosis. Electronically Signed   By: Jeannine Boga M.D.   On: 03/18/2017 17:20    Time Spent in minutes  30   Lala Lund M.D on 03/20/2017 at 2:35 PM  Between 7am to 7pm - Pager - [phone] ( page via [email or web address], text pages only,  please mention full 10 digit call back number). After 7pm go to [email or web address] - password [facility]

## 2017-03-20 NOTE — Progress Notes (Signed)
PT Cancellation Note  Patient Details Name: Barbara Cook MRN: [id number] DOB: 08-21-1955   Cancelled Treatment:    Reason Eval/Treat Not Completed: Medical issues which prohibited therapy--low hgb. Pt to receive blood transfusion. Will hold PT for now and check back another day/time. Thanks.    Weston Anna, MPT Pager: [phone]

## 2017-03-21 ENCOUNTER — Other Ambulatory Visit: Payer: 59

## 2017-03-21 ENCOUNTER — Ambulatory Visit: Payer: 59 | Admitting: Hematology & Oncology

## 2017-03-21 ENCOUNTER — Ambulatory Visit: Payer: 59

## 2017-03-21 ENCOUNTER — Encounter (HOSPITAL_COMMUNITY): Payer: Self-pay

## 2017-03-21 DIAGNOSIS — R110 Nausea: Secondary | ICD-10-CM

## 2017-03-21 LAB — CBC
HCT: 20.7 % — ABNORMAL LOW (ref 36.0–46.0)
Hemoglobin: 7 g/dL — ABNORMAL LOW (ref 12.0–15.0)
MCH: 29.4 pg (ref 26.0–34.0)
MCHC: 33.8 g/dL (ref 30.0–36.0)
MCV: 87 fL (ref 78.0–100.0)
PLATELETS: 69 10*3/uL — AB (ref 150–400)
RBC: 2.38 MIL/uL — ABNORMAL LOW (ref 3.87–5.11)
RDW: 15 % (ref 11.5–15.5)
WBC: 4.4 10*3/uL (ref 4.0–10.5)

## 2017-03-21 LAB — COMPREHENSIVE METABOLIC PANEL
ALBUMIN: 2.5 g/dL — AB (ref 3.5–5.0)
ALT: 50 U/L (ref 14–54)
AST: 72 U/L — AB (ref 15–41)
Alkaline Phosphatase: 25 U/L — ABNORMAL LOW (ref 38–126)
Anion gap: 4 — ABNORMAL LOW (ref 5–15)
BUN: 23 mg/dL — AB (ref 6–20)
CHLORIDE: 108 mmol/L (ref 101–111)
CO2: 23 mmol/L (ref 22–32)
CREATININE: 1.88 mg/dL — AB (ref 0.44–1.00)
Calcium: 9.4 mg/dL (ref 8.9–10.3)
GFR calc Af Amer: 32 mL/min — ABNORMAL LOW (ref >60)
GFR, EST NON AFRICAN AMERICAN: 28 mL/min — AB (ref >60)
GLUCOSE: 94 mg/dL (ref 65–99)
Potassium: 3.2 mmol/L — ABNORMAL LOW (ref 3.5–5.1)
Sodium: 135 mmol/L (ref 135–145)
Total Bilirubin: 0.8 mg/dL (ref 0.3–1.2)
Total Protein: 11 g/dL — ABNORMAL HIGH (ref 6.5–8.1)

## 2017-03-21 LAB — URINALYSIS, ROUTINE W REFLEX MICROSCOPIC
BILIRUBIN URINE: NEGATIVE
Glucose, UA: NEGATIVE mg/dL
KETONES UR: NEGATIVE mg/dL
NITRITE: NEGATIVE
PH: 6 (ref 5.0–8.0)
PROTEIN: NEGATIVE mg/dL
Specific Gravity, Urine: 1.013 (ref 1.005–1.030)
Squamous Epithelial / LPF: NONE SEEN

## 2017-03-21 LAB — PREPARE RBC (CROSSMATCH)

## 2017-03-21 MED ORDER — SODIUM CHLORIDE 0.9 % IV SOLN
40.0000 mg | INTRAVENOUS | Status: DC
  Administered 2017-03-21 – 2017-03-22 (×2): 40 mg via INTRAVENOUS
  Filled 2017-03-21 (×2): qty 4

## 2017-03-21 MED ORDER — PANTOPRAZOLE SODIUM 40 MG PO TBEC
40.0000 mg | DELAYED_RELEASE_TABLET | Freq: Two times a day (BID) | ORAL | Status: DC
  Administered 2017-03-21 – 2017-03-22 (×3): 40 mg via ORAL
  Filled 2017-03-21 (×3): qty 1

## 2017-03-21 MED ORDER — DEXAMETHASONE SODIUM PHOSPHATE 4 MG/ML IJ SOLN: 40.0000 mg | INTRAMUSCULAR | Status: DC

## 2017-03-21 MED ORDER — SODIUM CHLORIDE 0.9 % IV SOLN: Freq: Once | INTRAVENOUS | Status: AC

## 2017-03-21 MED ORDER — FUROSEMIDE 10 MG/ML IJ SOLN
20.0000 mg | Freq: Once | INTRAMUSCULAR | Status: DC
  Filled 2017-03-21: qty 2

## 2017-03-21 MED ORDER — MAGNESIUM SULFATE IN D5W 1-5 GM/100ML-% IV SOLN
1.0000 g | Freq: Once | INTRAVENOUS | Status: AC
  Administered 2017-03-21: 1 g via INTRAVENOUS
  Filled 2017-03-21: qty 100

## 2017-03-21 MED ORDER — POTASSIUM CHLORIDE CRYS ER 20 MEQ PO TBCR
40.0000 meq | EXTENDED_RELEASE_TABLET | Freq: Four times a day (QID) | ORAL | Status: AC
  Administered 2017-03-21 (×2): 40 meq via ORAL
  Filled 2017-03-21 (×2): qty 2

## 2017-03-21 MED ORDER — FUROSEMIDE 40 MG PO TABS
40.0000 mg | ORAL_TABLET | Freq: Once | ORAL | Status: AC
  Administered 2017-03-21: 40 mg via ORAL
  Filled 2017-03-21: qty 1

## 2017-03-21 NOTE — Consult Note (Signed)
Referral MD  Reason for Referral: Hypercalcemia secondary to myeloma  Chief Complaint  Patient presents with  . Fatigue  : I was not feeling well.  HPI: Ms. Pio is well-known to me.  She is a very nice 61 year old African-American female.  She has IgG Kappa myeloma..  She currently is on daratumumab.  She has had 2 cycles.  She was seen in the office on Friday with marked hypercalcemia.  She had a little confusion.  She subsequently was admitted.  She was tested negative for influenza A and influenza B.  Her calcium was treated.  Her calcium is come down nicely.  She is quite anemic.  Her hemoglobin is 7.  Her platelet count 69,000.  Her white cell count 4.4.  Her calcium is 9.4 with an albumin of 2.5.  Her potassium is 3.2.  She has a little nausea.  She has a little bit of a cough.  I am Cristine Polio, every symptom that she has she thinks is from the Daratumumab.  She has always been reluctant to take any treatment because she is afraid of side effects.  She had no fever.  She has had no rash.  She has had no diarrhea.  She has had no productive cough.  Currently, her performance status is ECOG 2.   Past Medical History:  Diagnosis Date  . Anemia   . Arthritis   . Arthropathy, unspecified, site unspecified   . Complication of anesthesia    02/13/14- had biospy in X-Ray- "twilight"  "I felt every thing"  . Constipation   . Endometriosis   . Family history of adverse reaction to anesthesia    Sister- patient will find out  . Fibroid   . GERD (gastroesophageal reflux disease)   . Heart murmur   . IBS (irritable bowel syndrome)   . MGUS (monoclonal gammopathy of unknown significance) 10-08-202013  . Multiple myeloma ([place]) 02/18/2014  . Myelitis ([place]) 2011   of bone- neck  . Neuromuscular disorder ([place])   . Polymyalgia rheumatica ([place])   . PONV (postoperative nausea and vomiting)    1990  :  Past Surgical History:  Procedure Laterality Date  . COLONOSCOPY    . Fistula  repair surg    . IR GENERIC HISTORICAL  04/14/2016   IR FLUORO GUIDE PORT INSERTION RIGHT 04/14/2016 Greggory Keen, MD [facility]-INTER[facility] RAD  . IR GENERIC HISTORICAL  04/14/2016   IR US GUIDE VASC ACCESS RIGHT 04/14/2016 Greggory Keen, MD [facility]-INTER[facility]  . KNEE ARTHROSCOPY Left   . NECK SURGERY  2011   Myletis- bone grafting- from her left hip  . TUBAL LIGATION    . UMBILICAL HERNIA REPAIR     age 74  :   Current Facility-Administered Medications:  .  0.9 %  sodium chloride infusion, , Intravenous, Once, Albaraa Swingle, Rudell Cobb, MD .  acetaminophen (TYLENOL) tablet 650 mg, 650 mg, Oral, Q6H PRN, Rise Patience, MD, 650 mg at 03/19/17 0546 .  enoxaparin (LOVENOX) injection 40 mg, 40 mg, Subcutaneous, QHS, Wouk, Ailene Rud, MD, 40 mg at 03/20/17 2051 .  furosemide (LASIX) injection 20 mg, 20 mg, Intravenous, Once, Lorrayne Ismael, Rudell Cobb, MD .  guaiFENesin-dextromethorphan (ROBITUSSIN DM) 100-10 MG/5ML syrup 5 mL, 5 mL, Oral, Q4H PRN, Thurnell Lose, MD, 5 mL at 03/20/17 2056 .  heparin lock flush 100 unit/mL, 500 Units, Intracatheter, Q30 days **AND** heparin lock flush 100 unit/mL, 500 Units, Intracatheter, PRN, Thurnell Lose, MD .  hyoscyamine (LEVSIN SL) SL tablet  0.125 mg, 0.125 mg, Sublingual, Q4H PRN, Wouk, Ailene Rud, MD .  magnesium oxide (MAG-OX) tablet 400 mg, 400 mg, Oral, BID, Wouk, Ailene Rud, MD, 400 mg at 03/20/17 2050 .  magnesium sulfate IVPB 1 g 100 mL, 1 g, Intravenous, Once, Singh, Prashant K, MD .  ondansetron (ZOFRAN) tablet 8 mg, 8 mg, Oral, Q8H PRN, Wouk, Ailene Rud, MD .  pantoprazole (PROTONIX) EC tablet 40 mg, 40 mg, Oral, Daily, Wouk, Ailene Rud, MD, 40 mg at 03/20/17 0929 .  potassium chloride SA (K-DUR,KLOR-CON) CR tablet 40 mEq, 40 mEq, Oral, Q6H, Thurnell Lose, MD, 40 mEq at 03/21/17 0658 .  sodium chloride flush (NS) 0.9 % injection 10-40 mL, 10-40 mL, Intracatheter, PRN, Wouk, Ailene Rud, MD:  . enoxaparin (LOVENOX) injection  40 mg Subcutaneous QHS  .  furosemide  20 mg Intravenous Once  . heparin lock flush  500 Units Intracatheter Q30 days  . magnesium oxide  400 mg Oral BID  . pantoprazole  40 mg Oral Daily  . potassium chloride  40 mEq Oral Q6H  :  Allergies  Allergen Reactions  . Codeine Palpitations  :  Family History  Problem Relation Age of Onset  . Heart disease Mother   . Rheum arthritis Mother   . Lung cancer Maternal Uncle   . Throat cancer Maternal Aunt   . Diabetes Maternal Grandmother   . Hypertension Maternal Grandmother   . Heart disease Maternal Grandmother   . Heart disease Paternal Grandmother   . Colon cancer Neg Hx   . Esophageal cancer Neg Hx   . Stomach cancer Neg Hx   . Rectal cancer Neg Hx   :  Social History   Socioeconomic History  . Marital status: Married    Spouse name: [name]  . Number of children: 1  . Years of education: 37  . Highest education level: Not on file  Social Needs  . Financial resource strain: Not on file  . Food insecurity - worry: Not on file  . Food insecurity - inability: Not on file  . Transportation needs - medical: Not on file  . Transportation needs - non-medical: Not on file  Occupational History  . Occupation: Metallurgist: Civil engineer, contracting  Tobacco Use  . Smoking status: Former Smoker    Packs/day: 25.00    Years: 5.00    Pack years: 125.00    Types: Cigarettes    Start date: 05/26/1988    Last attempt to quit: 01/11/1994    Years since quitting: 23.2  . Smokeless tobacco: Never Used  . Tobacco comment: QUIT SMOKING 20 YEARS AGO  Substance and Sexual Activity  . Alcohol use: No    Alcohol/week: 0.0 oz  . Drug use: No  . Sexual activity: Not on file    Comment: BTL  Other Topics Concern  . Not on file  Social History Narrative   Patient lives at home with her husband [name]). Patient works full time.   Education- High school   Right handed.   Caffeine- None  :  Pertinent items are noted in HPI.  Exam: Patient Vitals for  the past 24 hrs:  BP Temp Temp src Pulse Resp SpO2  03/21/17 0428 109/60 99.4 F (37.4 C) Oral 99 20 96 %  03/20/17 2044 (!) 110/49 98.9 F (37.2 C) Oral (!) 104 18 97 %  03/20/17 1442 119/66 - - (!) 111 18 97 %     Recent Labs  03/20/17 0409 03/20/17 1346 03/21/17 0412  WBC 5.1  --  4.4  HGB 6.9* 7.4* 7.0*  HCT 20.7* 21.7* 20.7*  PLT 67*  --  69*   Recent Labs    03/20/17 0409 03/21/17 0412  NA 135 135  K 3.8 3.2*  CL 110 108  CO2 22 23  GLUCOSE 90 94  BUN 21* 23*  CREATININE 1.83* 1.88*  CALCIUM 9.5 9.4    Blood smear review: None  Pathology: None    Assessment and Plan: Ms. Mckinstry is a 61 year old African-American female with myeloma.  She has hypercalcemia.  I really do think that she would benefit from a transfusion.  I realize that it may take a day or so before the blood is available as it is tough to crossmatch with her being on Daratumumab.  She does have a low erythropoietin level.  She may benefit from Aranesp.  Her iron studies looked okay.  As such, I do not think she needs any IV iron.  I will also give her some Decadron IV.  This may help a little bit with the hypercalcemia and myeloma.  She is very "delicate".  She has a Constitution that is not that great and tends to have problems very quickly.  I appreciate everybody's help with her.  We will follow along and help out anyway that we can.  Lattie Haw, MD  Jeneen Rinks 1:5-7

## 2017-03-21 NOTE — Progress Notes (Signed)
@IPLOG @        PROGRESS NOTE                                                                                                                                                                                                             Patient Demographics:    Annalina Needles, is a 61 y.o. female, DOB - 1955/10/02, KYH:[id number]  Admit date - 03/18/2017   Admitting Physician Donne Hazel, MD  Outpatient Primary MD for the patient is [name], MD  LOS - 3  Chief Complaint  Patient presents with  . Fatigue       Brief Narrative  AZARAH DACY is a 61 y.o. female with medical history significant for multiple myeloma presents w/ weakness. Has had about a week of progressively worsening weakness and fatigue. No headache or abdominal pain. Oral intake has been decreased because of this. No fevers but does have slight dry cough past few days. No diarrhea. Occasional nausea/vomiting unchanged from baseline.  Presented to oncologist where labs were checked revealing aki and hypercalcemia. Fluid bolus as well as calcitonin and zoledronic acid were given, and was referred to ED for admission.      Subjective:   Patient in bed, appears comfortable, denies any headache, no fever, no chest pain or pressure, no shortness of breath , no abdominal pain. No focal weakness, does feel weak generally all over.    Assessment  & Plan :     1.  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  She has already received calcitonin along with zoledronic acid at oncology office, continue hydration with IV fluids along with Lasix, monitor calcium levels, trend improving and close to NML ( peaked at >15).  PTH level ordered upon admission was 15 low normal and appropriately suppressed.  2.  Thrombocytopenia with Anemia of chronic disease due to underlying multiple myeloma -  worse due to dilution from IV fluids, stable anemia panel monitor trend.  Type screen has been done, case discussed  with patient's oncologist Dr. Marin Olp on 03/21/2017, he would like for patient to get 1-2 units of packed RBC transfused, this has been ordered on 03/20/2017 already, patient is hard crossmatch, blood coming from [facility] once available.  3.  GERD.  On PPI.  4.  ARF.  Due to hypercalcemia and dehydration, hydrate, transfuse and monitor avoid nephrotoxins.  Stable renal US.  5.  Low-grade fever.  Chest x-ray stable, influenza PCR negative, DC Tamiflu, no dysuria.  Continue to monitor.  6.  Hypomagnesemia and hypokalemia.  Replaced and stable.    Diet : Diet regular Room service appropriate? Yes; Fluid consistency: Thin    Family Communication  :  Husband  Code Status :  Full  Disposition Plan  :  TBD  Consults  :  None  Procedures  :    Renal US  -   DVT Prophylaxis  :  Lovenox   Lab Results  Component Value Date   PLT 69 (L) 03/21/2017    Inpatient Medications  Scheduled Meds: . enoxaparin (LOVENOX) injection  40 mg Subcutaneous QHS  . furosemide  20 mg Intravenous Once  . heparin lock flush  500 Units Intracatheter Q30 days  . magnesium oxide  400 mg Oral BID  . pantoprazole  40 mg Oral BID  . potassium chloride  40 mEq Oral Q6H   Continuous Infusions: . sodium chloride    . dexamethasone (DECADRON) IVPB [facility] Stopped (03/21/17 1102)   PRN Meds:.acetaminophen, guaiFENesin-dextromethorphan, heparin lock flush **AND** heparin lock flush, hyoscyamine, ondansetron, sodium chloride flush  Antibiotics  :    Anti-infectives (From admission, onward)   Start     Dose/Rate Route Frequency Ordered Stop   03/18/17 2315  oseltamivir (TAMIFLU) capsule 30 mg  Status:  Discontinued     30 mg Oral 2 times daily 03/18/17 2304 03/19/17 1327         Objective:   Vitals:   03/20/17 0442 03/20/17 1442 03/20/17 2044 03/21/17 0428  BP:  119/66 (!) 110/49 109/60  Pulse:  (!) 111 (!) 104 99  Resp:  18 18 20   Temp:   98.9 F (37.2 C) 99.4 F (37.4 C)  TempSrc:   Oral Oral   SpO2:  97% 97% 96%  Weight: 78.3 kg (172 lb 9.9 oz)     Height:        Wt Readings from Last 3 Encounters:  03/20/17 78.3 kg (172 lb 9.9 oz)  03/18/17 76.9 kg (169 lb 8 oz)  03/03/17 77.7 kg (171 lb 4 oz)     Intake/Output Summary (Last 24 hours) at 03/21/2017 1115 Last data filed at 03/21/2017 0900 Gross per 24 hour  Intake 360 ml  Output 2100 ml  Net -1740 ml     Physical Exam  Awake Alert, Oriented X 3, No new F.N deficits, Normal affect [place].AT,PERRAL Supple Neck,No JVD, No cervical lymphadenopathy appriciated.  Symmetrical Chest wall movement, Good air movement bilaterally, CTAB RRR,No Gallops, Rubs or new Murmurs, No Parasternal Heave +ve B.Sounds, Abd Soft, No tenderness, No organomegaly appriciated, No rebound - guarding or rigidity. No Cyanosis, Clubbing or edema, No new Rash or bruise    Data Review:    CBC Recent Labs  Lab 03/18/17 1147 03/18/17 2344 03/20/17 0409 03/20/17 1346 03/21/17 0412  WBC 4.4 3.8* 5.1  --  4.4  HGB 8.7* 7.5* 6.9* 7.4* 7.0*  HCT 25.7* 22.7* 20.7* 21.7* 20.7*  PLT 90* 82* 67*  --  69*  MCV 89 87.6 87.7  --  87.0  MCH 30.2 29.0 29.2  --  29.4  MCHC 33.9 33.0 33.3  --  33.8  RDW 14.1 14.8 15.1  --  15.0  LYMPHSABS 1.4  --   --   --   --   EOSABS 0.0  --   --   --   --   BASOSABS 0.0  --   --   --   --     Chemistries  Recent Labs  Lab 03/18/17 1147 03/18/17  1642 03/18/17 2344 03/19/17 0432 03/20/17 0409 03/21/17 0412  NA 139 130*  --  134* 135 135  K 3.5 3.6  --  3.9 3.8 3.2*  CL 101 106  --  107 110 108  CO2 27 23  --  24 22 23   GLUCOSE 82 96  --  97 90 94  BUN 23* 25*  --  24* 21* 23*  CREATININE 2.2* 1.83* 1.84* 1.80* 1.83* 1.88*  CALCIUM 13.7* 12.5*  --  10.5* 9.5 9.4  MG  --   --   --  1.4* 1.7  --   AST 52*  --   --  38 71* 72*  ALT 23  --   --  17 39 50  ALKPHOS 31  --   --  24* 25* 25*  BILITOT 0.70  --   --  0.5 0.5 0.8    ------------------------------------------------------------------------------------------------------------------ No results for input(s): CHOL, HDL, LDLCALC, TRIG, CHOLHDL, LDLDIRECT in the last 72 hours.  No results found for: HGBA1C ------------------------------------------------------------------------------------------------------------------ No results for input(s): TSH, T4TOTAL, T3FREE, THYROIDAB in the last 72 hours.  Invalid input(s): FREET3 ------------------------------------------------------------------------------------------------------------------ Recent Labs    03/18/17 2344 03/19/17 1400  VITAMINB12  --  835  FOLATE  --  14.1  FERRITIN 99 183  TIBC 206* 204*  IRON 42 51  RETICCTPCT  --  0.6    Coagulation profile No results for input(s): INR, PROTIME in the last 168 hours.  No results for input(s): DDIMER in the last 72 hours.  Cardiac Enzymes No results for input(s): CKMB, TROPONINI, MYOGLOBIN in the last 168 hours.  Invalid input(s): CK ------------------------------------------------------------------------------------------------------------------ No results found for: BNP  Micro Results Recent Results (from the past 240 hour(s))  Influenza A and B     Status: None   Collection Time: 03/18/17  1:14 PM  Result Value Ref Range Status   Influenza A Ag, EIA Negative Negative Final   Influenza B Ag, EIA Negative Negative Final   Influenza Comment See note  Final   Please note: Comment  Final    Comment: Because a negative rapid influenza antigen EIA test may not rule-out influenza viral infection, the CDC and other public health agencies have recommended that confirmatory testing using viral culture or nucleic acid amplification should be considered when clinical signs and symptoms are consistent with influenza-like illness, as well as to assist in detecting other respiratory viruses that can produce similar clinical symptoms. (Reference:  https://lee-mcguire.com/ guidance_ridt.pdf)   Culture, Urine     Status: Abnormal   Collection Time: 03/19/17  4:53 PM  Result Value Ref Range Status   Specimen Description URINE, RANDOM  Final   Special Requests NONE  Final   Culture MULTIPLE SPECIES PRESENT, SUGGEST RECOLLECTION (A)  Final   Report Status 03/20/2017 FINAL  Final    Radiology Reports Dg Chest 2 View  Result Date: 03/18/2017 CLINICAL DATA:  Initial evaluation for acute cough. History of multiple myeloma. EXAM: CHEST  2 VIEW COMPARISON:  Prior radiograph from 06/03/2014. FINDINGS: Access right-sided Port-A-Cath in place with tip overlying the cavoatrial junction. Transverse heart size stable in size and contour, and remains within normal limits. Mild fullness at the right paratracheal stripe, similar to previous, which may reflect persistent adenopathy. Mediastinal silhouette otherwise unremarkable. Aortic atherosclerosis. Lungs normally inflated. No focal infiltrates. No pulmonary edema or pleural effusion. No pneumothorax. No acute osseus abnormality para sequelae of prior vertebral augmentation noted at T12. IMPRESSION: 1. Fullness at the right paratracheal stripe, suggestive  of underlying adenopathy, similar to previous exams. 2. No other active cardiopulmonary disease. 3. Aortic atherosclerosis. Electronically Signed   By: Jeannine Boga M.D.   On: 03/18/2017 17:20   US Renal  Result Date: 03/20/2017 CLINICAL DATA:  Acute renal failure EXAM: RENAL / URINARY TRACT ULTRASOUND COMPLETE COMPARISON:  03/09/2016 CT abdomen/pelvis. FINDINGS: Right Kidney: Length: 9.7 cm. Echogenic right kidney. Normal right renal parenchymal thickness. No right hydronephrosis. No right renal mass. Left Kidney: Length: 9.4 cm. Echogenic left kidney. Normal left renal parenchymal thickness. No left hydronephrosis. No left renal mass. Bladder: Appears normal for degree of bladder distention. IMPRESSION: 1. No  hydronephrosis. 2. 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 3. Normal bladder. Electronically Signed   By: Ilona Sorrel M.D.   On: 03/20/2017 16:55    Time Spent in minutes  30   Lala Lund M.D on 03/21/2017 at 11:15 AM  Between 7am to 7pm - Pager - [phone] ( page via [email or web address], text pages only, please mention full 10 digit call back number). After 7pm go to [email or web address] - password [email or web address]

## 2017-03-21 NOTE — Progress Notes (Signed)
Physical Therapy Treatment Patient Details Name: Barbara Cook MRN: [id number] DOB: 1956-03-25 Today's Date: 03/21/2017    History of Present Illness 61 yo female admitted with hypercalcemia, weaknss, anemia. Hx of multiple myeloma, N/M d/o, arthritis, polymyalgia rheumatica    PT Comments    Progressing with mobility. Improved tolerance on today compared to yesterday. Will continue to follow.    Follow Up Recommendations  Home health PT;Supervision/Assistance - 24 hour     Equipment Recommendations  3in1 (PT)    Recommendations for Other Services       Precautions / Restrictions Precautions Precautions: Fall Restrictions Weight Bearing Restrictions: No    Mobility  Bed Mobility Overal bed mobility: Needs Assistance Bed Mobility: Supine to Sit     Supine to sit: Min guard;HOB elevated     General bed mobility comments: close guard for safety. Increased time. Pt used bedrail.   Transfers Overall transfer level: Needs assistance Equipment used: Rolling walker (2 wheeled) Transfers: Sit to/from Stand Sit to Stand: Min guard         General transfer comment: close guard for safety. VCs safety, hand placement. Increased time.   Ambulation/Gait Ambulation/Gait assistance: Min guard Ambulation Distance (Feet): 250 Feet Assistive device: Rolling walker (2 wheeled) Gait Pattern/deviations: Step-through pattern;Decreased stride length     General Gait Details: close guard for safety. slow gait speed.    Stairs            Wheelchair Mobility    Modified Rankin (Stroke Patients Only)       Balance                                            Cognition Arousal/Alertness: Awake/alert Behavior During Therapy: WFL for tasks assessed/performed Overall Cognitive Status: Within Functional Limits for tasks assessed                                        Exercises General Exercises - Lower Extremity Quad Sets:  AROM;Both;15 reps;Seated Long Arc Quad: AROM;Both;15 reps;Seated Hip ABduction/ADduction: AAROM;Both;10 reps;Seated Straight Leg Raises: AAROM;Both;10 reps;Seated Hip Flexion/Marching: AROM;Both;15 reps;Seated    General Comments        Pertinent Vitals/Pain Pain Assessment: No/denies pain    Home Living                      Prior Function            PT Goals (current goals can now be found in the care plan section) Progress towards PT goals: Progressing toward goals    Frequency    Min 3X/week      PT Plan Current plan remains appropriate    Co-evaluation              AM-PAC PT "6 Clicks" Daily Activity  Outcome Measure  Difficulty turning over in bed (including adjusting bedclothes, sheets and blankets)?: A Little Difficulty moving from lying on back to sitting on the side of the bed? : A Little Difficulty sitting down on and standing up from a chair with arms (e.g., wheelchair, bedside commode, etc,.)?: A Little Help needed moving to and from a bed to chair (including a wheelchair)?: A Little Help needed walking in hospital room?: A Little Help needed climbing 3-5 steps with a railing? :  A Lot 6 Click Score: 17    End of Session Equipment Utilized During Treatment: Gait belt Activity Tolerance: Patient tolerated treatment well Patient left: in chair;with call bell/phone within reach;with family/visitor present   PT Visit Diagnosis: Muscle weakness (generalized) (M62.81);Difficulty in walking, not elsewhere classified (R26.2)     Time: 6333-5456 PT Time Calculation (min) (ACUTE ONLY): 27 min  Charges:  $Gait Training: 8-22 mins $Therapeutic Exercise: 8-22 mins                    G Codes:          Weston Anna, MPT Pager: [phone]

## 2017-03-21 NOTE — Care Management Note (Signed)
Case Management Note  Patient Details  Name: Barbara Cook MRN: [id number] Date of Birth: 1956/03/28  Subjective/Objective:  61 y/o f admitted w/Hypercalcemia. From home. PT recc HHPT. Patient states she will get 3n1 on own. [facility] chosen for HHC-rep Santiago Glad aware of HHC order.                  Action/Plan:d/c home w/HHC.   Expected Discharge Date:                  Expected Discharge Plan:  [place]  In-House Referral:     Discharge planning Services  CM Consult  Post Acute Care Choice:    Choice offered to:  Patient  DME Arranged:    DME Agency:     HH Arranged:  PT [place]:  [place]  Status of Service:  In process, will continue to follow  If discussed at Long Length of Stay Meetings, dates discussed:    Additional Comments:  Dessa Phi, RN 03/21/2017, 4:09 PM

## 2017-03-22 LAB — COMPREHENSIVE METABOLIC PANEL
ALT: 49 U/L (ref 14–54)
AST: 61 U/L — AB (ref 15–41)
Albumin: 2.6 g/dL — ABNORMAL LOW (ref 3.5–5.0)
Alkaline Phosphatase: 30 U/L — ABNORMAL LOW (ref 38–126)
Anion gap: 3 — ABNORMAL LOW (ref 5–15)
BILIRUBIN TOTAL: 0.5 mg/dL (ref 0.3–1.2)
BUN: 35 mg/dL — AB (ref 6–20)
CO2: 21 mmol/L — ABNORMAL LOW (ref 22–32)
CREATININE: 1.74 mg/dL — AB (ref 0.44–1.00)
Calcium: 9 mg/dL (ref 8.9–10.3)
Chloride: 108 mmol/L (ref 101–111)
GFR calc Af Amer: 35 mL/min — ABNORMAL LOW (ref >60)
GFR, EST NON AFRICAN AMERICAN: 30 mL/min — AB (ref >60)
Glucose, Bld: 135 mg/dL — ABNORMAL HIGH (ref 65–99)
POTASSIUM: 4.7 mmol/L (ref 3.5–5.1)
Sodium: 132 mmol/L — ABNORMAL LOW (ref 135–145)
TOTAL PROTEIN: 11.8 g/dL — AB (ref 6.5–8.1)

## 2017-03-22 LAB — BPAM RBC
Blood Product Expiration Date: 201812132359
Blood Product Expiration Date: 201812202359
ISSUE DATE / TIME: 201811121618
ISSUE DATE / TIME: 201811122016
UNIT TYPE AND RH: 6200
Unit Type and Rh: 6200

## 2017-03-22 LAB — CBC
HEMATOCRIT: 27.4 % — AB (ref 36.0–46.0)
Hemoglobin: 9.2 g/dL — ABNORMAL LOW (ref 12.0–15.0)
MCH: 29 pg (ref 26.0–34.0)
MCHC: 33.6 g/dL (ref 30.0–36.0)
MCV: 86.4 fL (ref 78.0–100.0)
PLATELETS: 73 10*3/uL — AB (ref 150–400)
RBC: 3.17 MIL/uL — ABNORMAL LOW (ref 3.87–5.11)
RDW: 14.8 % (ref 11.5–15.5)
WBC: 4.3 10*3/uL (ref 4.0–10.5)

## 2017-03-22 LAB — TYPE AND SCREEN
ABO/RH(D): A POS
ANTIBODY SCREEN: POSITIVE
DAT, IgG: NEGATIVE
Unit division: 0
Unit division: 0

## 2017-03-22 NOTE — Progress Notes (Signed)
Barbara Cook is looking better.  She did well with the blood transfusion.  I appreciate the blood bank getting the blood for her.  Her hemoglobin is 9.2 this morning..  She just looks better.  Her appetite is better.  This might be from the Decadron that she is on.  Her calcium is 9.0.  Renal function also is a little bit better.  We really have to get her onto therapy.  This is on the way that we can try to minimize the hypercalcemia from recurring.  She has had no fever.  There is been no cough.  She has had no diarrhea.  She has had no bleeding.  Her vital signs look stable.  Her blood pressure is 139/79.  Her lungs are clear.  Cardiac exam regular rate and rhythm.  Abdomen is soft.  Back exam shows no tenderness over the spine.  Extremities shows some trace edema.  Neurological exam is nonfocal.  Hopefully, Ms. currently can go home.  I have to get her in this week for treatment.  Hopefully, the Daratumumab is going to work.  If we find that she is not responding, I may have to switch back to Carfilzomib/Cytoxan.  I know she had some issues with this.  However, I do feel confident that she would be able to handle this.  I appreciate his help with her on 21 W.  As always, the staff has done a tremendous job.   Lattie Haw, MD  Romans 8:28

## 2017-03-22 NOTE — Discharge Instructions (Signed)
Follow with Primary MD [name], MD in 7 days   Get CBC, CMP, 2 view Chest X ray checked  by Primary MD   in 5-7 days    Activity: As tolerated with Full fall precautions use walker/cane & assistance as needed  Disposition Home    Diet:   Heart Healthy   For Heart failure patients - Check your Weight same time everyday, if you gain over 2 pounds, or you develop in leg swelling, experience more shortness of breath or chest pain, call your Primary MD immediately. Follow Cardiac Low Salt Diet and 1.5 lit/day fluid restriction.  On your next visit with your primary care physician please Get Medicines reviewed and adjusted.  Please request your Prim.MD to go over all Hospital Tests and Procedure/Radiological results at the follow up, please get all Hospital records sent to your Prim MD by signing hospital release before you go home.  If you experience worsening of your admission symptoms, develop shortness of breath, life threatening emergency, suicidal or homicidal thoughts you must seek medical attention immediately by calling 911 or calling your MD immediately  if symptoms less severe.  You Must read complete instructions/literature along with all the possible adverse reactions/side effects for all the Medicines you take and that have been prescribed to you. Take any new Medicines after you have completely understood and accpet all the possible adverse reactions/side effects.   Do not take more than prescribed Pain, Sleep and Anxiety Medications. Do not drive when taking Pain medications.   Special Instructions: If you have smoked or chewed Tobacco  in the last 2 yrs please stop smoking, stop any regular Alcohol  and or any Recreational drug use.  Wear Seat belts while driving.

## 2017-03-22 NOTE — Care Management Note (Signed)
Case Management Note  Patient Details  Name: Barbara Cook MRN: [id number] Date of Birth: 1955/09/19  Subjective/Objective:  Noted HHRN/PT/aide ordered. Per [facility] aide not covered by insurance-patient declines aide. HHRN/PT. No further CM needs.                  Action/Plan:d/c home w/HHC.   Expected Discharge Date:  03/22/17               Expected Discharge Plan:  [place]  In-House Referral:     Discharge planning Services  CM Consult  Post Acute Care Choice:    Choice offered to:  Patient  DME Arranged:    DME Agency:     HH Arranged:  PT, RN [place] Agency:  [place]  Status of Service:  Completed, signed off  If discussed at [place] of Stay Meetings, dates discussed:    Additional Comments:  Dessa Phi, RN 03/22/2017, 12:29 PM

## 2017-03-22 NOTE — Discharge Summary (Signed)
Barbara Cook KZS:[id number] DOB: 1955-06-08 DOA: 03/18/2017  PCP: [name], MD  Admit date: 03/18/2017  Discharge date: 03/22/2017  Admitted From: Home Disposition:  Home   Recommendations for Outpatient Follow-up:   Follow up with PCP in 1-2 weeks  PCP Please obtain BMP/CBC, 2 view CXR in 1week,  (see Discharge instructions)   PCP Please follow up on the following pending results: None   Home Health: PT,RN, Aide   Equipment/Devices: None  Consultations: Oncology Discharge Condition: Stable   CODE STATUS: Full   Diet Recommendation:  Heart Healthy    Chief Complaint  Patient presents with  . Fatigue     Brief history of present illness from the day of admission and additional interim summary     Barbara Cook a 61 y.o.femalewith medical history significantfor multiple myeloma presents w/ weakness. Has had about a week of progressively worsening weakness and fatigue. No headache or abdominal pain. Oral intake has been decreased because of this. No fevers but does have slight dry cough past few days. No diarrhea. Occasional nausea/vomiting unchanged from baseline.  Presented to oncologist where labs were checked revealing aki and hypercalcemia. Fluid bolus as well as calcitonin and zoledronic acid were given, and was referred to ED for admission.                                                                   Hospital Course    1.  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  She has already received calcitonin along with zoledronic acid at oncology office, continue hydration with IV fluids along with Lasix, monitor calcium levels, trend improving and close to NML ( peaked at >15).  PTH level ordered upon admission was 15 low normal and appropriately suppressed.  She is  now symptom-free will be discharged home with outpatient follow-up with PCP and oncology, request both physicians to currently monitor her calcium levels closely.  2.  Thrombocytopenia with Anemia of chronic disease due to underlying multiple myeloma -  worse due to dilution from IV fluids, she had stable anemia panel .  Case was discussed with patient's oncologist Dr. Marin Olp on 03/21/2017, due to her generalized fatigue she received 2 units of packed RBC on 03/21/2017 with good effect, posttransfusion H&H is stable and she is feeling a whole lot better, will be discharged home today.  3.  GERD.  On PPI.  4.  ARF.  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  5.  Low-grade fever.  Chest x-ray stable, influenza PCR negative, DC  Tamiflu, no dysuria.  Continue to monitor.  6.  Hypomagnesemia and hypokalemia.  Replaced and stable.     Discharge diagnosis     Active Problems:   Multiple myeloma (HCC)   Hypercalcemia   Hypercalcemia of malignancy   Normocytic anemia   Acute kidney injury [facility])    Discharge instructions    Discharge Instructions    Diet - low sodium heart healthy   Complete by:  As directed    Discharge instructions   Complete by:  As directed    Follow with Primary MD [name], MD in 7 days   Get CBC, CMP, 2 view Chest X ray checked  by Primary MD   in 5-7 days    Activity: As tolerated with Full fall precautions use walker/cane & assistance as needed  Disposition Home    Diet:   Heart Healthy   For Heart failure patients - Check your Weight same time everyday, if you gain over 2 pounds, or you develop in leg swelling, experience more shortness of breath or chest pain, call your  Primary MD immediately. Follow Cardiac Low Salt Diet and 1.5 lit/day fluid restriction.  On your next visit with your primary care physician please Get Medicines reviewed and adjusted.  Please request your Prim.MD to go over all Hospital Tests and Procedure/Radiological results at the follow up, please get all Hospital records sent to your Prim MD by signing hospital release before you go home.  If you experience worsening of your admission symptoms, develop shortness of breath, life threatening emergency, suicidal or homicidal thoughts you must seek medical attention immediately by calling 911 or calling your MD immediately  if symptoms less severe.  You Must read complete instructions/literature along with all the possible adverse reactions/side effects for all the Medicines you take and that have been prescribed to you. Take any new Medicines after you have completely understood and accpet all the possible adverse reactions/side effects.   Do not take more than prescribed Pain, Sleep and Anxiety Medications. Do not drive when taking Pain medications.   Special Instructions: If you have smoked or chewed Tobacco  in the last 2 yrs please stop smoking, stop any regular Alcohol  and or any Recreational drug use.  Wear Seat belts while driving.   Increase activity slowly   Complete by:  As directed       Discharge Medications   Allergies as of 03/22/2017      Reactions   Codeine Palpitations      Medication List    STOP taking these medications   oseltamivir 75 MG capsule Commonly known as:  TAMIFLU     TAKE these medications   acetaminophen 500 MG tablet Commonly known as:  TYLENOL Take 500 mg by mouth every 6 (six) hours as needed for mild pain or headache.   benzonatate 100 MG capsule Commonly known as:  TESSALON PERLES Take 2 capsules (200 mg total) 3 (three) times daily as needed by mouth for cough.   hyoscyamine 0.125 MG SL tablet Commonly known as:  LEVSIN SL Place 1  tablet (0.125 mg total) under the tongue every 4 (four) hours as needed.   magnesium oxide 400 (241.3 Mg) MG tablet Commonly known as:  MAG-OX TAKE 1 TABLET BY MOUTH TWICE A DAY   montelukast 10 MG tablet Commonly known as:  SINGULAIR Take 1 pill daily for 5 days starting today.   multivitamin tablet Take 1 tablet by mouth every evening.   ondansetron 8  MG tablet Commonly known as:  ZOFRAN TAKE 1 TABLET BY MOUTH TWICE A DAY FOR NAUSEA AND VOMITING 1 TABLET 1 HOUR PRIOR TO CHEMOTHERAPY   pantoprazole 40 MG tablet Commonly known as:  PROTONIX Take 1 tablet (40 mg total) by mouth daily.   polyethylene glycol powder powder Commonly known as:  GLYCOLAX/MIRALAX 1 capful daily as needed   torsemide 20 MG tablet Commonly known as:  DEMADEX Take 1 tablet (20 mg total) by mouth daily as needed.   vitamin B-6 250 MG tablet Take 1 tablet (250 mg total) by mouth daily.   Vitamin D (Ergocalciferol) 50000 units Caps capsule Commonly known as:  DRISDOL TAKE 1 CAPSULE (50,000 UNITS TOTAL) BY MOUTH ONCE A WEEK.       Follow-up Information    [facility] Follow [facility].   Specialty:  [place] Why:  [facility] physical therapy Contact information: [address] [place] [address] [phone]        [name], MD. Schedule an appointment as soon as possible for a visit in 5 day(s).   Specialty:  Internal Medicine Contact information: [address] [place] [address] [phone]        Elmarie Shiley, MD. Schedule an appointment as soon as possible for a visit in 1 week(s).   Specialty:  Nephrology Why:  CKD 4 Contact information: [place] [address] [phone]        [name], MD. Schedule an appointment as soon as possible for a visit in 1 week(s).   Specialty:  Oncology Contact information: [address] [place] [address] [phone]           Major procedures  and Radiology Reports - PLEASE review detailed and final reports thoroughly  -         Dg Chest 2 View  Result Date: 03/18/2017 CLINICAL DATA:  Initial evaluation for acute cough. History of multiple myeloma. EXAM: CHEST  2 VIEW COMPARISON:  Prior radiograph from 06/03/2014. FINDINGS: Access right-sided Port-A-Cath in place with tip overlying the cavoatrial junction. Transverse heart size stable in size and contour, and remains within normal limits. Mild fullness at the right paratracheal stripe, similar to previous, which may reflect persistent adenopathy. Mediastinal silhouette otherwise unremarkable. Aortic atherosclerosis. Lungs normally inflated. No focal infiltrates. No pulmonary edema or pleural effusion. No pneumothorax. No acute osseus abnormality para sequelae of prior vertebral augmentation noted at T12. IMPRESSION: 1. Fullness at the right paratracheal stripe, suggestive of underlying adenopathy, similar to previous exams. 2. No other active cardiopulmonary disease. 3. Aortic atherosclerosis. Electronically Signed   By: Jeannine Boga M.D.   On: 03/18/2017 17:20   US Renal  Result Date: 03/20/2017 CLINICAL DATA:  Acute renal failure EXAM: RENAL / URINARY TRACT ULTRASOUND COMPLETE COMPARISON:  03/09/2016 CT abdomen/pelvis. FINDINGS: Right Kidney: Length: 9.7 cm. Echogenic right kidney. Normal right renal parenchymal thickness. No right hydronephrosis. No right renal mass. Left Kidney: Length: 9.4 cm. Echogenic left kidney. Normal left renal parenchymal thickness. No left hydronephrosis. No left renal mass. Bladder: Appears normal for degree of bladder distention. IMPRESSION: 1. No hydronephrosis. 2. 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 3. Normal bladder. Electronically Signed   By: Ilona Sorrel M.D.   On: 03/20/2017 16:55    Micro Results     Recent Results (from the past 240 hour(s))  Influenza A and B     Status: None  Collection Time: 03/18/17  1:14 PM  Result Value Ref Range Status   Influenza A Ag, EIA Negative Negative Final   Influenza B Ag, EIA Negative Negative Final   Influenza Comment See note  Final   Please note: Comment  Final    Comment: Because a negative rapid influenza antigen EIA test may not rule-out influenza viral infection, the CDC and other public health agencies have recommended that confirmatory testing using viral culture or nucleic acid amplification should be considered when clinical signs and symptoms are consistent with influenza-like illness, as well as to assist in detecting other respiratory viruses that can produce similar clinical symptoms. (Reference: https://lee-mcguire.com/ guidance_ridt.pdf)   Culture, Urine     Status: Abnormal   Collection Time: 03/19/17  4:53 PM  Result Value Ref Range Status   Specimen Description URINE, RANDOM  Final   Special Requests NONE  Final   Culture MULTIPLE SPECIES PRESENT, SUGGEST RECOLLECTION (A)  Final   Report Status 03/20/2017 FINAL  Final    Today   Subjective    Barbara Cook today has no headache,no chest abdominal pain,no new weakness tingling or numbness, feels much better wants to go home today.    Objective   Blood pressure 139/79, pulse 74, temperature (!) 97.1 F (36.2 C), temperature source Oral, resp. rate 16, height 5' 4" (1.626 m), weight 78.3 kg (172 lb 9.9 oz), SpO2 98 %.   Intake/Output Summary (Last 24 hours) at 03/22/2017 0835 Last data filed at 03/21/2017 2259 Gross per 24 hour  Intake 1703.42 ml  Output 200 ml  Net 1503.42 ml    Exam  Awake Alert, Oriented x 3, No new F.N deficits, Normal affect [place].AT,PERRAL Supple Neck,No JVD, No cervical lymphadenopathy appriciated.  Symmetrical Chest wall movement, Good air movement bilaterally, CTAB RRR,No Gallops,Rubs or new Murmurs, No Parasternal Heave +ve B.Sounds, Abd Soft, Non tender, No organomegaly  appriciated, No rebound -guarding or rigidity. No Cyanosis, Clubbing or edema, No new Rash or bruise   Data Review   CBC w Diff:  Lab Results  Component Value Date   WBC 4.3 03/22/2017   HGB 9.2 (L) 03/22/2017   HGB 8.7 (L) 03/18/2017   HGB 11.5 (L) 10/26/2005   HCT 27.4 (L) 03/22/2017   HCT 25.7 (L) 03/18/2017   HCT 34.8 10/26/2005   PLT 73 (L) 03/22/2017   PLT 90 (L) 03/18/2017   PLT 177 10/26/2005   LYMPHOPCT 31.2 03/18/2017   LYMPHOPCT 45.2 10/26/2005   MONOPCT 14.7 (H) 03/18/2017   MONOPCT 9.2 10/26/2005   EOSPCT 0.5 03/18/2017   EOSPCT 2.7 10/26/2005   BASOPCT 0.2 03/18/2017   BASOPCT 0.5 10/26/2005    CMP:  Lab Results  Component Value Date   NA 132 (L) 03/22/2017   NA 139 03/18/2017   NA 139 02/20/2016   K 4.7 03/22/2017   K 3.5 03/18/2017   K 3.8 02/20/2016   CL 108 03/22/2017   CL 101 03/18/2017   CO2 21 (L) 03/22/2017   CO2 27 03/18/2017   CO2 29 02/20/2016   BUN 35 (H) 03/22/2017   BUN 23 (H) 03/18/2017   BUN 22.3 02/20/2016   CREATININE 1.74 (H) 03/22/2017   CREATININE 2.2 (H) 03/18/2017   CREATININE 1.3 (H) 02/20/2016   PROT 11.8 (H) 03/22/2017   PROT 13.2 (H) 03/18/2017   PROT 9.4 (H) 02/20/2016   ALBUMIN 2.6 (L) 03/22/2017   ALBUMIN 2.9 (L) 03/18/2017   ALBUMIN 3.5 02/20/2016   BILITOT 0.5 03/22/2017  BILITOT 0.70 03/18/2017   BILITOT 0.44 02/20/2016   ALKPHOS 30 (L) 03/22/2017   ALKPHOS 31 03/18/2017   ALKPHOS 62 02/20/2016   AST 61 (H) 03/22/2017   AST 52 (H) 03/18/2017   AST 27 02/20/2016   ALT 49 03/22/2017   ALT 23 03/18/2017   ALT 19 02/20/2016  .   Total Time in preparing paper work, data evaluation and todays exam - 33 minutes  Lala Lund M.D on 03/22/2017 at 8:35 AM  [facility]   Office  [phone]

## 2017-03-22 NOTE — Progress Notes (Signed)
Physical Therapy Treatment Patient Details Name: Barbara Cook MRN: [id number] DOB: [birth year] Today's Date: 03/22/2017    History of Present Illness 61 yo female admitted with hypercalcemia, weaknss, anemia. Hx of multiple myeloma, N/M d/o, arthritis, polymyalgia rheumatica    PT Comments    On arrival to room pt was standing at side of bed without assist needing to go to the bathroom.  Assisted as pt was tangled in IV line and bed cord.  Assisted a few side steps to [facility] only.  Pt requested to stay on [facility] "for a while" so did not amb.  Pt plans to D/C to home with spouse.    Follow Up Recommendations  Home health PT;Supervision/Assistance - 24 hour     Equipment Recommendations  3in1 (PT)    Recommendations for Other Services       Precautions / Restrictions Precautions Precautions: Fall Restrictions Weight Bearing Restrictions: No    Mobility  Bed Mobility               General bed mobility comments: pt standing EOB without assist on arrival.  Instructed on safety and need to call for assist as cord and IV line was tangled up.    Transfers Overall transfer level: Needs assistance Equipment used: None Transfers: Stand Pivot Transfers Sit to Stand: Supervision;Min guard         General transfer comment: 100% VC's for environmental safety as cords and lines were tangled close to her feet.   Assisted to [facility].     Ambulation/Gait Ambulation/Gait assistance: Min guard;Min assist Ambulation Distance (Feet): 3 Feet Assistive device: None Gait Pattern/deviations: Step-to pattern     General Gait Details: a few side steps alonb bed to [facility] with 100% VC's on safety.  Unable to amb in hall this session due to pt's need to remain on [facility] "for a while".     Stairs            Wheelchair Mobility    Modified Rankin (Stroke Patients Only)       Balance                                            Cognition Arousal/Alertness:  Awake/alert Behavior During Therapy: WFL for tasks assessed/performed Overall Cognitive Status: Within Functional Limits for tasks assessed                                        Exercises      General Comments        Pertinent Vitals/Pain      Home Living                      Prior Function            PT Goals (current goals can now be found in the care plan section) Progress towards PT goals: Progressing toward goals    Frequency    Min 3X/week      PT Plan Current plan remains appropriate    Co-evaluation              AM-PAC PT "6 Clicks" Daily Activity  Outcome Measure  Difficulty turning over in bed (including adjusting bedclothes, sheets and blankets)?: A Little Difficulty moving from lying on back to  sitting on the side of the bed? : A Little Difficulty sitting down on and standing up from a chair with arms (e.g., wheelchair, bedside commode, etc,.)?: A Little Help needed moving to and from a bed to chair (including a wheelchair)?: A Little Help needed walking in hospital room?: A Little Help needed climbing 3-5 steps with a railing? : A Lot 6 Click Score: 17    End of Session     Patient left: Other (comment)(on BSC with call light in reach)   PT Visit Diagnosis: Muscle weakness (generalized) (M62.81);Difficulty in walking, not elsewhere classified (R26.2)     Time: 6219-4712 PT Time Calculation (min) (ACUTE ONLY): 15 min  Charges:  $Therapeutic Activity: 8-22 mins                    G Codes:       {Daijon Wenke  PTA [facility]  Acute  Rehab Pager      [phone]

## 2017-03-22 NOTE — Care Management Note (Signed)
Case Management Note  Patient Details  Name: Barbara Cook MRN: [id number] Date of Birth: 1955-07-22  Subjective/Objective: d/c home today w/HHPT-[facility] rep Santiago Glad aware of [place] orders, & d/c today. No further CM needs.                   Action/Plan:d/c home w/HHC.   Expected Discharge Date:  03/22/17               Expected Discharge Plan:  [place]  In-House Referral:     Discharge planning Services  CM Consult  Post Acute Care Choice:    Choice offered to:  Patient  DME Arranged:    DME Agency:     HH Arranged:  PT [place]:  [place]  Status of Service:  Completed, signed off  If discussed at [place] of Stay Meetings, dates discussed:    Additional Comments:  Dessa Phi, RN 03/22/2017, 10:30 AM

## 2017-03-23 DIAGNOSIS — D696 Thrombocytopenia, unspecified: Secondary | ICD-10-CM | POA: Diagnosis not present

## 2017-03-23 DIAGNOSIS — C9000 Multiple myeloma not having achieved remission: Secondary | ICD-10-CM | POA: Diagnosis not present

## 2017-03-24 ENCOUNTER — Ambulatory Visit: Payer: 59

## 2017-03-24 ENCOUNTER — Ambulatory Visit (HOSPITAL_BASED_OUTPATIENT_CLINIC_OR_DEPARTMENT_OTHER): Payer: 59

## 2017-03-24 VITALS — BP 137/66 | HR 56 | Temp 97.9°F | Resp 16

## 2017-03-24 DIAGNOSIS — C9000 Multiple myeloma not having achieved remission: Secondary | ICD-10-CM

## 2017-03-24 DIAGNOSIS — Z5112 Encounter for antineoplastic immunotherapy: Secondary | ICD-10-CM | POA: Diagnosis not present

## 2017-03-24 LAB — CBC WITH DIFFERENTIAL (CANCER CENTER ONLY)
BASO#: 0 10*3/uL (ref 0.0–0.2)
BASO%: 0 % (ref 0.0–2.0)
EOS%: 0.2 % (ref 0.0–7.0)
Eosinophils Absolute: 0 10*3/uL (ref 0.0–0.5)
HCT: 28.6 % — ABNORMAL LOW (ref 34.8–46.6)
HGB: 9.7 g/dL — ABNORMAL LOW (ref 11.6–15.9)
LYMPH#: 1.2 10*3/uL (ref 0.9–3.3)
LYMPH%: 24.2 % (ref 14.0–48.0)
MCH: 29.6 pg (ref 26.0–34.0)
MCHC: 33.9 g/dL (ref 32.0–36.0)
MCV: 87 fL (ref 81–101)
MONO#: 0.4 10*3/uL (ref 0.1–0.9)
MONO%: 8.1 % (ref 0.0–13.0)
NEUT#: 3.4 10*3/uL (ref 1.5–6.5)
NEUT%: 67.5 % (ref 39.6–80.0)
PLATELETS: 97 10*3/uL — AB (ref 145–400)
RBC: 3.28 10*6/uL — ABNORMAL LOW (ref 3.70–5.32)
RDW: 14.9 % (ref 11.1–15.7)
WBC: 5 10*3/uL (ref 3.9–10.0)

## 2017-03-24 LAB — CMP (CANCER CENTER ONLY)
ALBUMIN: 2.5 g/dL — AB (ref 3.3–5.5)
ALK PHOS: 35 U/L (ref 26–84)
ALT: 61 U/L — AB (ref 10–47)
AST: 55 U/L — AB (ref 11–38)
BILIRUBIN TOTAL: 0.7 mg/dL (ref 0.20–1.60)
BUN, Bld: 41 mg/dL — ABNORMAL HIGH (ref 7–22)
CO2: 22 meq/L (ref 18–33)
CREATININE: 1.5 mg/dL — AB (ref 0.6–1.2)
Calcium: 8.2 mg/dL (ref 8.0–10.3)
Chloride: 110 mEq/L — ABNORMAL HIGH (ref 98–108)
GLUCOSE: 104 mg/dL (ref 73–118)
Potassium: 3.6 mEq/L (ref 3.3–4.7)
SODIUM: 140 meq/L (ref 128–145)
Total Protein: 11.6 g/dL — ABNORMAL HIGH (ref 6.4–8.1)

## 2017-03-24 MED ORDER — ACETAMINOPHEN 325 MG PO TABS
ORAL_TABLET | ORAL | Status: AC
  Filled 2017-03-24: qty 2

## 2017-03-24 MED ORDER — TRAMADOL HCL 50 MG PO TABS: 50.0000 mg | ORAL_TABLET | Freq: Four times a day (QID) | ORAL | 0 refills | Status: DC | PRN

## 2017-03-24 MED ORDER — ACETAMINOPHEN 325 MG PO TABS
650.0000 mg | ORAL_TABLET | Freq: Once | ORAL | Status: AC
  Administered 2017-03-24: 650 mg via ORAL

## 2017-03-24 MED ORDER — KETOROLAC TROMETHAMINE 15 MG/ML IJ SOLN
INTRAMUSCULAR | Status: AC
  Filled 2017-03-24: qty 1

## 2017-03-24 MED ORDER — SODIUM CHLORIDE 0.9 % IV SOLN
15.5000 mg/kg | Freq: Once | INTRAVENOUS | Status: AC
  Administered 2017-03-24: 1200 mg via INTRAVENOUS
  Filled 2017-03-24 (×2): qty 60

## 2017-03-24 MED ORDER — DIPHENHYDRAMINE HCL 25 MG PO CAPS
ORAL_CAPSULE | ORAL | Status: AC
  Filled 2017-03-24: qty 1

## 2017-03-24 MED ORDER — SODIUM CHLORIDE 0.9% FLUSH
10.0000 mL | INTRAVENOUS | Status: DC | PRN
  Administered 2017-03-24: 10 mL
  Filled 2017-03-24: qty 10

## 2017-03-24 MED ORDER — SODIUM CHLORIDE 0.9 % IV SOLN
Freq: Once | INTRAVENOUS | Status: AC
  Administered 2017-03-24: 09:00:00 via INTRAVENOUS

## 2017-03-24 MED ORDER — METHYLPREDNISOLONE SODIUM SUCC 125 MG IJ SOLR
INTRAMUSCULAR | Status: AC
  Filled 2017-03-24: qty 2

## 2017-03-24 MED ORDER — PROCHLORPERAZINE MALEATE 10 MG PO TABS
ORAL_TABLET | ORAL | Status: AC
  Filled 2017-03-24: qty 1

## 2017-03-24 MED ORDER — FAMOTIDINE IN NACL 20-0.9 MG/50ML-% IV SOLN
INTRAVENOUS | Status: AC
  Filled 2017-03-24: qty 100

## 2017-03-24 MED ORDER — KETOROLAC TROMETHAMINE 15 MG/ML IJ SOLN
15.0000 mg | Freq: Once | INTRAMUSCULAR | Status: AC
  Administered 2017-03-24: 15 mg via INTRAVENOUS

## 2017-03-24 MED ORDER — PROCHLORPERAZINE MALEATE 10 MG PO TABS
10.0000 mg | ORAL_TABLET | Freq: Once | ORAL | Status: AC
  Administered 2017-03-24: 10 mg via ORAL

## 2017-03-24 MED ORDER — METHYLPREDNISOLONE SODIUM SUCC 125 MG IJ SOLR
125.0000 mg | Freq: Once | INTRAMUSCULAR | Status: AC
  Administered 2017-03-24: 125 mg via INTRAVENOUS

## 2017-03-24 MED ORDER — FAMOTIDINE IN NACL 20-0.9 MG/50ML-% IV SOLN
40.0000 mg | Freq: Once | INTRAVENOUS | Status: AC
  Administered 2017-03-24: 40 mg via INTRAVENOUS

## 2017-03-24 MED ORDER — DIPHENHYDRAMINE HCL 25 MG PO CAPS
25.0000 mg | ORAL_CAPSULE | Freq: Once | ORAL | Status: AC
  Administered 2017-03-24: 25 mg via ORAL

## 2017-03-24 MED ORDER — HEPARIN SOD (PORK) LOCK FLUSH 100 UNIT/ML IV SOLN
500.0000 [IU] | Freq: Once | INTRAVENOUS | Status: AC | PRN
  Administered 2017-03-24: 500 [IU]
  Filled 2017-03-24: qty 5

## 2017-03-24 NOTE — Progress Notes (Signed)
Pt requesting pain medicine for pain to back, bilateral hips and "muscle discomfort" to upper abdomen, rates at a 7/10.  Dr. Marin Olp notified and order received to give pt Toradol 15 mg IV now.  Given at 0957. 1030-Pt asleep 1115-Pt states that pain is now at a "0".

## 2017-03-25 ENCOUNTER — Telehealth: Payer: Self-pay | Admitting: *Deleted

## 2017-03-25 NOTE — Telephone Encounter (Signed)
Patient is c/o abdominal aching. She would like to know if there is anything she can take to treat.   Spoke to [facility] NP and she would like patient to try the new prescription (tramadol) given to her yesterday.   Instructed patient to try the tramadol. She states she thought she could just use this medication for back pain. She will try the tramadol for her abdominal discomfort.

## 2017-03-28 ENCOUNTER — Ambulatory Visit: Payer: 59

## 2017-03-28 ENCOUNTER — Other Ambulatory Visit: Payer: 59

## 2017-04-01 ENCOUNTER — Encounter: Payer: Self-pay | Admitting: Hematology & Oncology

## 2017-04-04 ENCOUNTER — Other Ambulatory Visit: Payer: Self-pay | Admitting: *Deleted

## 2017-04-04 ENCOUNTER — Ambulatory Visit (HOSPITAL_BASED_OUTPATIENT_CLINIC_OR_DEPARTMENT_OTHER): Payer: 59 | Admitting: Hematology & Oncology

## 2017-04-04 ENCOUNTER — Encounter: Payer: Self-pay | Admitting: Hematology & Oncology

## 2017-04-04 ENCOUNTER — Other Ambulatory Visit: Payer: Self-pay

## 2017-04-04 ENCOUNTER — Encounter: Payer: Self-pay | Admitting: Pharmacist

## 2017-04-04 ENCOUNTER — Ambulatory Visit (HOSPITAL_COMMUNITY)
Admission: RE | Admit: 2017-04-04 | Discharge: 2017-04-04 | Disposition: A | Discharge status: Home / Self Care | Payer: 59 | Source: Ambulatory Visit | Attending: Hematology & Oncology | Admitting: Hematology & Oncology

## 2017-04-04 ENCOUNTER — Other Ambulatory Visit (HOSPITAL_BASED_OUTPATIENT_CLINIC_OR_DEPARTMENT_OTHER): Payer: 59

## 2017-04-04 ENCOUNTER — Other Ambulatory Visit: Payer: 59

## 2017-04-04 ENCOUNTER — Ambulatory Visit (HOSPITAL_BASED_OUTPATIENT_CLINIC_OR_DEPARTMENT_OTHER): Payer: 59

## 2017-04-04 VITALS — BP 117/54 | HR 112 | Temp 98.7°F | Resp 17

## 2017-04-04 DIAGNOSIS — C9000 Multiple myeloma not having achieved remission: Secondary | ICD-10-CM

## 2017-04-04 DIAGNOSIS — M899 Disorder of bone, unspecified: Secondary | ICD-10-CM

## 2017-04-04 LAB — CMP (CANCER CENTER ONLY)
ALBUMIN: 2.8 g/dL — AB (ref 3.3–5.5)
ALT: 25 U/L (ref 10–47)
AST: 49 U/L — AB (ref 11–38)
Alkaline Phosphatase: 34 U/L (ref 26–84)
BILIRUBIN TOTAL: 0.5 mg/dL (ref 0.20–1.60)
BUN, Bld: 13 mg/dL (ref 7–22)
CALCIUM: 10 mg/dL (ref 8.0–10.3)
CO2: 25 mEq/L (ref 18–33)
Chloride: 103 mEq/L (ref 98–108)
Creat: 1.3 mg/dl — ABNORMAL HIGH (ref 0.6–1.2)
GLUCOSE: 94 mg/dL (ref 73–118)
POTASSIUM: 3.8 meq/L (ref 3.3–4.7)
Sodium: 136 mEq/L (ref 128–145)
TOTAL PROTEIN: 12.7 g/dL — AB (ref 6.4–8.1)

## 2017-04-04 LAB — CBC WITH DIFFERENTIAL (CANCER CENTER ONLY)
BASO#: 0 10*3/uL (ref 0.0–0.2)
BASO%: 0.3 % (ref 0.0–2.0)
EOS%: 0.3 % (ref 0.0–7.0)
Eosinophils Absolute: 0 10*3/uL (ref 0.0–0.5)
HEMATOCRIT: 27.5 % — AB (ref 34.8–46.6)
HEMOGLOBIN: 9.3 g/dL — AB (ref 11.6–15.9)
LYMPH#: 0.9 10*3/uL (ref 0.9–3.3)
LYMPH%: 24.7 % (ref 14.0–48.0)
MCH: 29.6 pg (ref 26.0–34.0)
MCHC: 33.8 g/dL (ref 32.0–36.0)
MCV: 88 fL (ref 81–101)
MONO#: 0.6 10*3/uL (ref 0.1–0.9)
MONO%: 17.1 % — ABNORMAL HIGH (ref 0.0–13.0)
NEUT%: 57.6 % (ref 39.6–80.0)
NEUTROS ABS: 2.1 10*3/uL (ref 1.5–6.5)
Platelets: 76 10*3/uL — ABNORMAL LOW (ref 145–400)
RBC: 3.14 10*6/uL — AB (ref 3.70–5.32)
RDW: 14.4 % (ref 11.1–15.7)
WBC: 3.7 10*3/uL — ABNORMAL LOW (ref 3.9–10.0)

## 2017-04-04 MED ORDER — FAMOTIDINE IN NACL 20-0.9 MG/50ML-% IV SOLN
40.0000 mg | Freq: Once | INTRAVENOUS | Status: AC
  Administered 2017-04-04: 40 mg via INTRAVENOUS

## 2017-04-04 MED ORDER — FAMOTIDINE IN NACL 20-0.9 MG/50ML-% IV SOLN
INTRAVENOUS | Status: AC
  Filled 2017-04-04: qty 100

## 2017-04-04 MED ORDER — SODIUM CHLORIDE 0.9 % IV SOLN
Freq: Once | INTRAVENOUS | Status: AC
  Administered 2017-04-04: 13:00:00 via INTRAVENOUS

## 2017-04-04 MED ORDER — SODIUM CHLORIDE 0.9 % IV SOLN
40.0000 mg | Freq: Once | INTRAVENOUS | Status: AC
  Administered 2017-04-04: 40 mg via INTRAVENOUS
  Filled 2017-04-04: qty 4

## 2017-04-04 MED ORDER — ZOLEDRONIC ACID 4 MG/5ML IV CONC
4.0000 mg | Freq: Once | INTRAVENOUS | Status: AC
  Administered 2017-04-04: 4 mg via INTRAVENOUS
  Filled 2017-04-04: qty 5

## 2017-04-04 MED ORDER — HEPARIN SOD (PORK) LOCK FLUSH 100 UNIT/ML IV SOLN
500.0000 [IU] | Freq: Once | INTRAVENOUS | Status: DC | PRN
  Filled 2017-04-04: qty 5

## 2017-04-04 NOTE — Patient Instructions (Signed)
Dehydration, Adult Dehydration is when there is not enough fluid or water in your body. This happens when you lose more fluids than you take in. Dehydration can range from mild to very bad. It should be treated right away to keep it from getting very bad. Symptoms of mild dehydration may include:  Thirst.  Dry lips.  Slightly dry mouth.  Dry, warm skin.  Dizziness. Symptoms of moderate dehydration may include:  Very dry mouth.  Muscle cramps.  Dark pee (urine). Pee may be the color of tea.  Your body making less pee.  Your eyes making fewer tears.  Heartbeat that is uneven or faster than normal (palpitations).  Headache.  Light-headedness, especially when you stand up from sitting.  Fainting (syncope). Symptoms of very bad dehydration may include:  Changes in skin, such as: ? Cold and clammy skin. ? Blotchy (mottled) or pale skin. ? Skin that does not quickly return to normal after being lightly pinched and let go (poor skin turgor).  Changes in body fluids, such as: ? Feeling very thirsty. ? Your eyes making fewer tears. ? Not sweating when body temperature is high, such as in hot weather. ? Your body making very little pee.  Changes in vital signs, such as: ? Weak pulse. ? Pulse that is more than 100 beats a minute when you are sitting still. ? Fast breathing. ? Low blood pressure.  Other changes, such as: ? Sunken eyes. ? Cold hands and feet. ? Confusion. ? Lack of energy (lethargy). ? Trouble waking up from sleep. ? Short-term weight loss. ? Unconsciousness. Follow these instructions at home:  If told by your doctor, drink an ORS: ? Make an ORS by using instructions on the package. ? Start by drinking small amounts, about  cup (120 mL) every 5-10 minutes. ? Slowly drink more until you have had the amount that your doctor said to have.  Drink enough clear fluid to keep your pee clear or pale yellow. If you were told to drink an ORS, finish the ORS  first, then start slowly drinking clear fluids. Drink fluids such as: ? Water. Do not drink only water by itself. Doing that can make the salt (sodium) level in your body get too low (hyponatremia). ? Ice chips. ? Fruit juice that you have added water to (diluted). ? Low-calorie sports drinks.  Avoid: ? Alcohol. ? Drinks that have a lot of sugar. These include high-calorie sports drinks, fruit juice that does not have water added, and soda. ? Caffeine. ? Foods that are greasy or have a lot of fat or sugar.  Take over-the-counter and prescription medicines only as told by your doctor.  Do not take salt tablets. Doing that can make the salt level in your body get too high (hypernatremia).  Eat foods that have minerals (electrolytes). Examples include bananas, oranges, potatoes, tomatoes, and spinach.  Keep all follow-up visits as told by your doctor. This is important. Contact a doctor if:  You have belly (abdominal) pain that: ? Gets worse. ? Stays in one area (localizes).  You have a rash.  You have a stiff neck.  You get angry or annoyed more easily than normal (irritability).  You are more sleepy than normal.  You have a harder time waking up than normal.  You feel: ? Weak. ? Dizzy. ? Very thirsty.  You have peed (urinated) only a small amount of very dark pee during 6-8 hours. Get help right away if:  You have symptoms of   very bad dehydration.  You cannot drink fluids without throwing up (vomiting).  Your symptoms get worse with treatment.  You have a fever.  You have a very bad headache.  You are throwing up or having watery poop (diarrhea) and it: ? Gets worse. ? Does not go away.  You have blood or something green (bile) in your throw-up.  You have blood in your poop (stool). This may cause poop to look black and tarry.  You have not peed in 6-8 hours.  You pass out (faint).  Your heart rate when you are sitting still is more than 100 beats a  minute.  You have trouble breathing. This information is not intended to replace advice given to you by your health care provider. Make sure you discuss any questions you have with your health care provider. Document Released: 02/20/2009 Document Revised: 11/14/2015 Document Reviewed: 06/20/2015 Elsevier Interactive Patient Education  2018 [place]. Zoledronic Acid injection (Hypercalcemia, Oncology) What is this medicine? ZOLEDRONIC ACID (ZOE le dron ik AS id) lowers the amount of calcium loss from bone. It is used to treat too much calcium in your blood from cancer. It is also used to prevent complications of cancer that has spread to the bone. This medicine may be used for other purposes; ask your health care provider or pharmacist if you have questions. COMMON BRAND NAME(S): Zometa What should I tell my health care provider before I take this medicine? They need to know if you have any of these conditions: -aspirin-sensitive asthma -cancer, especially if you are receiving medicines used to treat cancer -dental disease or wear dentures -infection -kidney disease -receiving corticosteroids like dexamethasone or prednisone -an unusual or allergic reaction to zoledronic acid, other medicines, foods, dyes, or preservatives -pregnant or trying to get pregnant -breast-feeding How should I use this medicine? This medicine is for infusion into a vein. It is given by a health care professional in a hospital or clinic setting. Talk to your pediatrician regarding the use of this medicine in children. Special care may be needed. Overdosage: If you think you have taken too much of this medicine contact a poison control center or emergency room at once. NOTE: This medicine is only for you. Do not share this medicine with others. What if I miss a dose? It is important not to miss your dose. Call your doctor or health care professional if you are unable to keep an appointment. What may interact  with this medicine? -certain antibiotics given by injection -NSAIDs, medicines for pain and inflammation, like ibuprofen or naproxen -some diuretics like bumetanide, furosemide -teriparatide -thalidomide This list may not describe all possible interactions. Give your health care provider a list of all the medicines, herbs, non-prescription drugs, or dietary supplements you use. Also tell them if you smoke, drink alcohol, or use illegal drugs. Some items may interact with your medicine. What should I watch for while using this medicine? Visit your doctor or health care professional for regular checkups. It may be some time before you see the benefit from this medicine. Do not stop taking your medicine unless your doctor tells you to. Your doctor may order blood tests or other tests to see how you are doing. Women should inform their doctor if they wish to become pregnant or think they might be pregnant. There is a potential for serious side effects to an unborn child. Talk to your health care professional or pharmacist for more information. You should make sure that you get enough  calcium and vitamin D while you are taking this medicine. Discuss the foods you eat and the vitamins you take with your health care professional. Some people who take this medicine have severe bone, joint, and/or muscle pain. This medicine may also increase your risk for jaw problems or a broken thigh bone. Tell your doctor right away if you have severe pain in your jaw, bones, joints, or muscles. Tell your doctor if you have any pain that does not go away or that gets worse. Tell your dentist and dental surgeon that you are taking this medicine. You should not have major dental surgery while on this medicine. See your dentist to have a dental exam and fix any dental problems before starting this medicine. Take good care of your teeth while on this medicine. Make sure you see your dentist for regular follow-up  appointments. What side effects may I notice from receiving this medicine? Side effects that you should report to your doctor or health care professional as soon as possible: -allergic reactions like skin rash, itching or hives, swelling of the face, lips, or tongue -anxiety, confusion, or depression -breathing problems -changes in vision -eye pain -feeling faint or lightheaded, falls -jaw pain, especially after dental work -mouth sores -muscle cramps, stiffness, or weakness -redness, blistering, peeling or loosening of the skin, including inside the mouth -trouble passing urine or change in the amount of urine Side effects that usually do not require medical attention (report to your doctor or health care professional if they continue or are bothersome): -bone, joint, or muscle pain -constipation -diarrhea -fever -hair loss -irritation at site where injected -loss of appetite -nausea, vomiting -stomach upset -trouble sleeping -trouble swallowing -weak or tired This list may not describe all possible side effects. Call your doctor for medical advice about side effects. You may report side effects to FDA at 1-800-FDA-1088. Where should I keep my medicine? This drug is given in a hospital or clinic and will not be stored at home. NOTE: This sheet is a summary. It may not cover all possible information. If you have questions about this medicine, talk to your doctor, pharmacist, or health care provider.  2018 Elsevier/Gold Standard (2013-09-22 14:19:39)

## 2017-04-04 NOTE — Progress Notes (Signed)
Hematology and Oncology Follow Up Visit  Barbara Cook [id number] [birth year] 61 y.o. 04/04/2017   Principle Diagnosis:  IgG kappa myeloma Hypercalcemia of malignancy  Current Therapy:   Daratumumab q weekly therapy - s/p cycle #3 - d/c'ed due to progression of myeloma Kyprolis/Cytoxan/Decadron - cycle #1 to start on 04/11/2017 Zometa every 3 months    Interim History:  Barbara Cook is here today with her husband.  Unfortunately, she is having some more problems.  See my she was doing well for Thanksgiving.  However, over the weekend, she seemed to decline.  She got more tired.  She had a decreased appetite.  She was not having any pain.  She is constipated.  I suspect that her disease is progressing.  She has been on daratumumab.  This is incredibly unfortunate.  It also is incredibly unusual to see disease progression with Daratumumab.  Her total protein today was 12.7.  I suspect that this is all secondary to her myeloma progressing.  I believe it is imperative that we switch therapies on her.  I think she actually was responding to high prolapse.  However, she is not tolerant of the weekly dosing.  I think that we have to go twice weekly with a lower dose so that she can tolerate this.  She has not had any fever.  She has had no rashes.  She has had no nausea or vomiting.  Currently, her performance status is ECOG 2.  Medications:  Allergies as of 04/04/2017      Reactions   Codeine Palpitations      Medication List        Accurate as of 04/04/17  1:31 PM. Always use your most recent med list.          acetaminophen 500 MG tablet Commonly known as:  TYLENOL Take 500 mg by mouth every 6 (six) hours as needed for mild pain or headache.   benzonatate 100 MG capsule Commonly known as:  TESSALON PERLES Take 2 capsules (200 mg total) 3 (three) times daily as needed by mouth for cough.   hyoscyamine 0.125 MG SL tablet Commonly known as:  LEVSIN SL Place 1 tablet  (0.125 mg total) under the tongue every 4 (four) hours as needed.   magnesium oxide 400 (241.3 Mg) MG tablet Commonly known as:  MAG-OX TAKE 1 TABLET BY MOUTH TWICE A DAY   montelukast 10 MG tablet Commonly known as:  SINGULAIR Take 1 pill daily for 5 days starting today.   multivitamin tablet Take 1 tablet by mouth every evening.   ondansetron 8 MG tablet Commonly known as:  ZOFRAN TAKE 1 TABLET BY MOUTH TWICE A DAY FOR NAUSEA AND VOMITING 1 TABLET 1 HOUR PRIOR TO CHEMOTHERAPY   oseltamivir 75 MG capsule Commonly known as:  TAMIFLU   pantoprazole 40 MG tablet Commonly known as:  PROTONIX Take 1 tablet (40 mg total) by mouth daily.   polyethylene glycol powder powder Commonly known as:  GLYCOLAX/MIRALAX 1 capful daily as needed   torsemide 20 MG tablet Commonly known as:  DEMADEX Take 1 tablet (20 mg total) by mouth daily as needed.   traMADol 50 MG tablet Commonly known as:  ULTRAM Take 1 tablet (50 mg total) every 6 (six) hours as needed by mouth.   vitamin B-6 250 MG tablet Take 1 tablet (250 mg total) by mouth daily.   Vitamin D (Ergocalciferol) 50000 units Caps capsule Commonly known as:  DRISDOL TAKE 1 CAPSULE (50,000 UNITS TOTAL)  BY MOUTH ONCE A WEEK.       Allergies:  Allergies  Allergen Reactions  . Codeine Palpitations    Past Medical History, Surgical history, Social history, and Family History were reviewed and updated.  Review of Systems: As stated in the interim history.   Physical Exam:  vitals were not taken for this visit.   Wt Readings from Last 3 Encounters:  03/20/17 172 lb 9.9 oz (78.3 kg)  03/18/17 169 lb 8 oz (76.9 kg)  03/03/17 171 lb 4 oz (77.7 kg)    Slightly ill-appearing African-American female.  She is slightly lethargic but oriented x3.  She is able to converse without problems.  Her vital signs show a temperature of 98.7.  Pulse 112.  Blood pressure 117/54.  Weight was not taken.  Head neck exam shows no ocular or oral  lesions.  There are no palpable cervical or supraclavicular lymph nodes.  Lungs are clear bilaterally.  Cardiac exam regular rate and rhythm with no murmurs, rubs or bruits.  Abdomen is soft.  Bowel sounds slightly decreased.  She has no guarding or rebound tenderness.  There is no fluid wave.  There is no palpable liver or spleen tip.  Back exam shows no tenderness over the spine, ribs or hips.  Extremities shows no clubbing, cyanosis or edema.  Neurological exam shows no focal neurological deficits.     Lab Results  Component Value Date   WBC 3.7 (L) 04/04/2017   HGB 9.3 (L) 04/04/2017   HCT 27.5 (L) 04/04/2017   MCV 88 04/04/2017   PLT 76 (L) 04/04/2017   Lab Results  Component Value Date   FERRITIN 183 03/19/2017   IRON 51 03/19/2017   TIBC 204 (L) 03/19/2017   UIBC 153 03/19/2017   IRONPCTSAT 25 03/19/2017   Lab Results  Component Value Date   RETICCTPCT 0.6 03/19/2017   RBC 3.14 (L) 04/04/2017   RETICCTABS 35.9 12/25/2014   Lab Results  Component Value Date   KPAFRELGTCHN 7.11 (H) 05/07/2015   LAMBDASER 1.65 05/07/2015   KAPLAMBRATIO 28.70 (H) 02/17/2017   Lab Results  Component Value Date   IGGSERUM 5,814 (H) 02/17/2017   IGA 116 05/07/2015   IGMSERUM 13 (L) 02/17/2017   Lab Results  Component Value Date   TOTALPROTELP 7.8 05/07/2015   ALBUMINELP 4.2 05/07/2015   A1GS 0.3 05/07/2015   A2GS 0.8 05/07/2015   BETS 0.4 05/07/2015   BETA2SER 0.3 05/07/2015   GAMS 1.9 (H) 05/07/2015   MSPIKE 4.2 (H) 02/17/2017   SPEI * 05/07/2015     Chemistry      Component Value Date/Time   NA 136 04/04/2017 0856   NA 139 02/20/2016 1207   K 3.8 04/04/2017 0856   K 3.8 02/20/2016 1207   CL 103 04/04/2017 0856   CO2 25 04/04/2017 0856   CO2 29 02/20/2016 1207   BUN 13 04/04/2017 0856   BUN 22.3 02/20/2016 1207   CREATININE 1.3 (H) 04/04/2017 0856   CREATININE 1.3 (H) 02/20/2016 1207      Component Value Date/Time   CALCIUM 10.0 04/04/2017 0856   CALCIUM 9.7  02/20/2016 1207   ALKPHOS 34 04/04/2017 0856   ALKPHOS 62 02/20/2016 1207   AST 49 (H) 04/04/2017 0856   AST 27 02/20/2016 1207   ALT 25 04/04/2017 0856   ALT 19 02/20/2016 1207   BILITOT 0.50 04/04/2017 0856   BILITOT 0.44 02/20/2016 1207      Impression and Plan: Barbara Cook is a  very pleasant 61 yo [place] American female with IgG myeloma.   I had a long talk with she and her husband today.  I explained my concerns that what we are currently doing with Daratumumab is not working.  Because of this, I really think that we have to switch treatments quickly.  I talked to them about the Carfilzomib/Cytoxan protocol.  I think that this would be effective.  I think that she is suffering from some degree of hyperviscosity from her myeloma.  She also has an element of hypercalcium.   They are in agreement to making the switch to Carfilzomib/Cytoxan.  I think that we can get a relatively quick response with this.  I believe that she also would benefit from IV fluids today.  I will give her a dose of Zometa.  I will also give her some Protonix and Decadron.  I do believe that 1 unit of blood will be helpful.  The only problem is that given that she has been on daratumumab, it is difficult to crossmatch her.  It was done in the hospital she had a transfusion safely.  Hopefully, we will be able to get a response.  I know that Ms. Miner has been reluctant to try treatment in the past.  However, I believe that she understands that her disease is coming much more resilient to therapy.  I truly hope that we will be able to get a response and see her quality of life improve.  If not, then she will have to ultimately be admitted for the possibility of inpatient aggressive chemotherapy with VD-PACE.  I spent about 45 minutes with she and her husband today.  I have known him for quite a while.  I answered all of their questions.  I tried to reassure them as much as I could.  We will start her new  protocol 1 week from today.    [name], MD 11/26/20181:31 PM

## 2017-04-05 ENCOUNTER — Telehealth: Payer: Self-pay | Admitting: *Deleted

## 2017-04-05 LAB — PREPARE RBC (CROSSMATCH)

## 2017-04-05 NOTE — Telephone Encounter (Addendum)
Patient was supposed to come in today to receive one unit of PRBC. Due to a difficult crossmatch, her unit will not be available until late Wednesday. This office does not have the space to transfuse patient Thursday.  Reached out to the Patient Barbara Cook and they can accommodate patient Thursday morning.   Informed patient about new appointment time and location of clinic. Address and phone number given. She understands instructions.

## 2017-04-07 ENCOUNTER — Ambulatory Visit (HOSPITAL_COMMUNITY)
Admission: RE | Admit: 2017-04-07 | Discharge: 2017-04-07 | Disposition: A | Discharge status: Home / Self Care | Payer: 59 | Source: Ambulatory Visit | Attending: Hematology & Oncology | Admitting: Hematology & Oncology

## 2017-04-07 DIAGNOSIS — C9000 Multiple myeloma not having achieved remission: Secondary | ICD-10-CM

## 2017-04-07 MED ORDER — SODIUM CHLORIDE 0.9% FLUSH
10.0000 mL | INTRAVENOUS | Status: AC | PRN
  Administered 2017-04-07: 10 mL

## 2017-04-07 MED ORDER — HEPARIN SOD (PORK) LOCK FLUSH 100 UNIT/ML IV SOLN: 250.0000 [IU] | INTRAVENOUS | Status: DC | PRN

## 2017-04-07 MED ORDER — SODIUM CHLORIDE 0.9% FLUSH: 3.0000 mL | INTRAVENOUS | Status: DC | PRN

## 2017-04-07 MED ORDER — HEPARIN SOD (PORK) LOCK FLUSH 100 UNIT/ML IV SOLN
500.0000 [IU] | Freq: Every day | INTRAVENOUS | Status: AC | PRN
  Administered 2017-04-07: 500 [IU]
  Filled 2017-04-07: qty 5

## 2017-04-07 MED ORDER — FUROSEMIDE 10 MG/ML IJ SOLN
20.0000 mg | Freq: Once | INTRAMUSCULAR | Status: AC
  Administered 2017-04-07: 20 mg via INTRAVENOUS
  Filled 2017-04-07: qty 2

## 2017-04-07 MED ORDER — SODIUM CHLORIDE 0.9 % IV SOLN: 250.0000 mL | Freq: Once | INTRAVENOUS | Status: DC

## 2017-04-07 NOTE — Progress Notes (Signed)
PATIENT CARE CENTER NOTE  Diagnosis: Anemia   Provider: Dr. Pearletha Alfred   Procedure: 1 unit RBC's   Note: Patient received one unit of blood without any complication. Discharge instructions given to patient and family. Patient expresses an understanding. Patient alert, oriented, and ambulatory at discharge.

## 2017-04-07 NOTE — Discharge Instructions (Signed)

## 2017-04-08 DIAGNOSIS — D696 Thrombocytopenia, unspecified: Secondary | ICD-10-CM | POA: Diagnosis not present

## 2017-04-08 DIAGNOSIS — C9000 Multiple myeloma not having achieved remission: Secondary | ICD-10-CM | POA: Diagnosis not present

## 2017-04-08 LAB — TYPE AND SCREEN
ABO/RH(D): A POS
ANTIBODY SCREEN: NEGATIVE
DAT, IGG: POSITIVE
Unit division: 0

## 2017-04-08 LAB — BPAM RBC
BLOOD PRODUCT EXPIRATION DATE: 201812162359
ISSUE DATE / TIME: 201811291025
UNIT TYPE AND RH: 5100

## 2017-04-11 ENCOUNTER — Ambulatory Visit (HOSPITAL_BASED_OUTPATIENT_CLINIC_OR_DEPARTMENT_OTHER): Payer: 59 | Admitting: Hematology & Oncology

## 2017-04-11 ENCOUNTER — Other Ambulatory Visit: Payer: Self-pay

## 2017-04-11 ENCOUNTER — Encounter: Payer: Self-pay | Admitting: Family

## 2017-04-11 ENCOUNTER — Other Ambulatory Visit: Payer: 59

## 2017-04-11 ENCOUNTER — Ambulatory Visit (HOSPITAL_BASED_OUTPATIENT_CLINIC_OR_DEPARTMENT_OTHER): Payer: 59

## 2017-04-11 ENCOUNTER — Other Ambulatory Visit: Payer: Self-pay | Admitting: *Deleted

## 2017-04-11 ENCOUNTER — Ambulatory Visit: Payer: 59

## 2017-04-11 VITALS — BP 112/56 | HR 94 | Temp 98.4°F | Wt 167.0 lb

## 2017-04-11 DIAGNOSIS — Z5112 Encounter for antineoplastic immunotherapy: Secondary | ICD-10-CM

## 2017-04-11 DIAGNOSIS — C9000 Multiple myeloma not having achieved remission: Secondary | ICD-10-CM

## 2017-04-11 DIAGNOSIS — C9002 Multiple myeloma in relapse: Secondary | ICD-10-CM

## 2017-04-11 DIAGNOSIS — Z5111 Encounter for antineoplastic chemotherapy: Secondary | ICD-10-CM | POA: Diagnosis not present

## 2017-04-11 LAB — CMP (CANCER CENTER ONLY)
ALBUMIN: 2.5 g/dL — AB (ref 3.3–5.5)
ALK PHOS: 41 U/L (ref 26–84)
ALT: 16 U/L (ref 10–47)
AST: 33 U/L (ref 11–38)
BILIRUBIN TOTAL: 0.8 mg/dL (ref 0.20–1.60)
BUN, Bld: 15 mg/dL (ref 7–22)
CALCIUM: 9 mg/dL (ref 8.0–10.3)
CO2: 23 meq/L (ref 18–33)
CREATININE: 1.2 mg/dL (ref 0.6–1.2)
Chloride: 106 mEq/L (ref 98–108)
Glucose, Bld: 132 mg/dL — ABNORMAL HIGH (ref 73–118)
Potassium: 3.1 mEq/L — ABNORMAL LOW (ref 3.3–4.7)
SODIUM: 139 meq/L (ref 128–145)
TOTAL PROTEIN: 11.8 g/dL — AB (ref 6.4–8.1)

## 2017-04-11 LAB — CBC WITH DIFFERENTIAL (CANCER CENTER ONLY)
BASO#: 0 10*3/uL (ref 0.0–0.2)
BASO%: 0.3 % (ref 0.0–2.0)
EOS ABS: 0.1 10*3/uL (ref 0.0–0.5)
EOS%: 2.1 % (ref 0.0–7.0)
HEMATOCRIT: 28.7 % — AB (ref 34.8–46.6)
HEMOGLOBIN: 9.6 g/dL — AB (ref 11.6–15.9)
LYMPH#: 1 10*3/uL (ref 0.9–3.3)
LYMPH%: 28.9 % (ref 14.0–48.0)
MCH: 28.8 pg (ref 26.0–34.0)
MCHC: 33.4 g/dL (ref 32.0–36.0)
MCV: 86 fL (ref 81–101)
MONO#: 0.4 10*3/uL (ref 0.1–0.9)
MONO%: 12.8 % (ref 0.0–13.0)
NEUT%: 55.9 % (ref 39.6–80.0)
NEUTROS ABS: 1.8 10*3/uL (ref 1.5–6.5)
Platelets: 82 10*3/uL — ABNORMAL LOW (ref 145–400)
RBC: 3.33 10*6/uL — ABNORMAL LOW (ref 3.70–5.32)
RDW: 15 % (ref 11.1–15.7)
WBC: 3.3 10*3/uL — AB (ref 3.9–10.0)

## 2017-04-11 MED ORDER — DEXAMETHASONE SODIUM PHOSPHATE 10 MG/ML IJ SOLN
10.0000 mg | Freq: Once | INTRAMUSCULAR | Status: AC
  Administered 2017-04-11: 10 mg via INTRAVENOUS

## 2017-04-11 MED ORDER — DEXAMETHASONE SODIUM PHOSPHATE 10 MG/ML IJ SOLN
INTRAMUSCULAR | Status: AC
  Filled 2017-04-11: qty 1

## 2017-04-11 MED ORDER — PALONOSETRON HCL INJECTION 0.25 MG/5ML
0.2500 mg | Freq: Once | INTRAVENOUS | Status: AC
  Administered 2017-04-11: 0.25 mg via INTRAVENOUS

## 2017-04-11 MED ORDER — SODIUM CHLORIDE 0.9% FLUSH
10.0000 mL | INTRAVENOUS | Status: DC | PRN
  Administered 2017-04-11: 10 mL
  Filled 2017-04-11: qty 10

## 2017-04-11 MED ORDER — SODIUM CHLORIDE 0.9 % IV SOLN
Freq: Once | INTRAVENOUS | Status: AC
Start: 2017-04-11 — End: 2017-04-11
  Administered 2017-04-11: 10:00:00 via INTRAVENOUS

## 2017-04-11 MED ORDER — HEPARIN SOD (PORK) LOCK FLUSH 100 UNIT/ML IV SOLN
500.0000 [IU] | Freq: Once | INTRAVENOUS | Status: AC | PRN
  Administered 2017-04-11: 500 [IU]
  Filled 2017-04-11: qty 5

## 2017-04-11 MED ORDER — PALONOSETRON HCL INJECTION 0.25 MG/5ML
INTRAVENOUS | Status: AC
  Filled 2017-04-11: qty 5

## 2017-04-11 MED ORDER — PROCHLORPERAZINE MALEATE 10 MG PO TABS: 10.0000 mg | ORAL_TABLET | Freq: Four times a day (QID) | ORAL | 6 refills | Status: AC | PRN

## 2017-04-11 MED ORDER — DEXTROSE 5 % IV SOLN
20.0000 mg/m2 | Freq: Once | INTRAVENOUS | Status: AC
  Administered 2017-04-11: 38 mg via INTRAVENOUS
  Filled 2017-04-11: qty 19

## 2017-04-11 MED ORDER — SODIUM CHLORIDE 0.9 % IV SOLN
Freq: Once | INTRAVENOUS | Status: DC
Start: 2017-04-11 — End: 2017-04-11

## 2017-04-11 MED ORDER — CYCLOPHOSPHAMIDE CHEMO INJECTION 1 GM
300.0000 mg/m2 | Freq: Once | INTRAMUSCULAR | Status: AC
  Administered 2017-04-11: 560 mg via INTRAVENOUS
  Filled 2017-04-11: qty 28

## 2017-04-11 MED ORDER — ACYCLOVIR 400 MG PO TABS: 400.0000 mg | ORAL_TABLET | Freq: Every day | ORAL | 1 refills | Status: AC

## 2017-04-11 NOTE — Progress Notes (Signed)
Ok to treat with PLT count today per MD Ennever 

## 2017-04-11 NOTE — Progress Notes (Signed)
Hematology and Oncology Follow Up Visit  WILFRED SIVERSON [id number] 10-28-1955 61 y.o. 04/11/2017   Principle Diagnosis:  IgG kappa myeloma Hypercalcemia of malignancy  Current Therapy:   Daratumumab q weekly therapy - s/p cycle #3 - d/c'ed due to progression of myeloma Kyprolis/Cytoxan/Decadron - cycle #1 to start on 04/11/2017 Zometa every 3 months    Interim History:  Ms. Buchan is here today with her husband.  She looks so much better.  We treated her hypercalcemia last week.  She also got 1 unit of blood.  She looks like her old self.  She still is not eating all that well.  She is having no nausea or vomiting.  Her appetite just is not that great.  She fever.  She has had no rashes.  She has had no nausea or vomiting.  Her myeloma studies that were done a couple months ago showed her M spike to be 4.2 g/dL.  Her IgG level was 5800 mg/dL.  Her kappa light chain was 20.1 mg/dL.  All these levels are going up.  We are now going to try her on Kyprolis/Cytoxan.  She wants to have a dog.  Her husband is against this.  I told her from a health perspective, I do not see a problem with her getting a dog.  She has had no diarrhea.  She has had some back discomfort.  She does have some myeloma in the spine.  She has had no leg weakness.  She has had no leg swelling.  Currently, her performance status is ECOG 1.  Medications:  Allergies as of 04/11/2017      Reactions   Codeine Palpitations      Medication List        Accurate as of 04/11/17  9:37 AM. Always use your most recent med list.          acetaminophen 500 MG tablet Commonly known as:  TYLENOL Take 500 mg by mouth every 6 (six) hours as needed for mild pain or headache.   benzonatate 100 MG capsule Commonly known as:  TESSALON PERLES Take 2 capsules (200 mg total) 3 (three) times daily as needed by mouth for cough.   hyoscyamine 0.125 MG SL tablet Commonly known as:  LEVSIN SL Place 1 tablet (0.125 mg total)  under the tongue every 4 (four) hours as needed.   magnesium oxide 400 (241.3 Mg) MG tablet Commonly known as:  MAG-OX TAKE 1 TABLET BY MOUTH TWICE A DAY   montelukast 10 MG tablet Commonly known as:  SINGULAIR Take 1 pill daily for 5 days starting today.   multivitamin tablet Take 1 tablet by mouth every evening.   ondansetron 8 MG tablet Commonly known as:  ZOFRAN TAKE 1 TABLET BY MOUTH TWICE A DAY FOR NAUSEA AND VOMITING 1 TABLET 1 HOUR PRIOR TO CHEMOTHERAPY   oseltamivir 75 MG capsule Commonly known as:  TAMIFLU   pantoprazole 40 MG tablet Commonly known as:  PROTONIX Take 1 tablet (40 mg total) by mouth daily.   polyethylene glycol powder powder Commonly known as:  GLYCOLAX/MIRALAX 1 capful daily as needed   torsemide 20 MG tablet Commonly known as:  DEMADEX Take 1 tablet (20 mg total) by mouth daily as needed.   traMADol 50 MG tablet Commonly known as:  ULTRAM Take 1 tablet (50 mg total) every 6 (six) hours as needed by mouth.   vitamin B-6 250 MG tablet Take 1 tablet (250 mg total) by mouth daily.  Vitamin D (Ergocalciferol) 50000 units Caps capsule Commonly known as:  DRISDOL TAKE 1 CAPSULE (50,000 UNITS TOTAL) BY MOUTH ONCE A WEEK.       Allergies:  Allergies  Allergen Reactions  . Codeine Palpitations    Past Medical History, Surgical history, Social history, and Family History were reviewed and updated.  Review of Systems: As stated in the interim history.   Physical Exam:  weight is 167 lb (75.8 kg). Her oral temperature is 98.4 F (36.9 C). Her blood pressure is 112/56 (abnormal) and her pulse is 94. Her oxygen saturation is 98%.   Wt Readings from Last 3 Encounters:  04/11/17 167 lb (75.8 kg)  03/20/17 172 lb 9.9 oz (78.3 kg)  03/18/17 169 lb 8 oz (76.9 kg)    Fairly well-developed and well-nourished African-American female.  Head and neck exam shows no ocular or oral lesions.  She has no adenopathy in the neck.  Lungs are clear  bilaterally.  Cardiac exam regular rate and rhythm with no murmurs, rubs or bruits.  Abdomen is soft.  She has good bowel sounds.  There is no fluid wave.  There is no palpable liver or spleen tip.  Back exam shows no tenderness over the spine, ribs or hips.  Extremities shows no clubbing, cyanosis or edema.  Neurological exam shows no focal neurological deficits.  Skin exam shows no rashes, ecchymoses or petechia.       Lab Results  Component Value Date   WBC 3.3 (L) 04/11/2017   HGB 9.6 (L) 04/11/2017   HCT 28.7 (L) 04/11/2017   MCV 86 04/11/2017   PLT 82 (L) 04/11/2017   Lab Results  Component Value Date   FERRITIN 183 03/19/2017   IRON 51 03/19/2017   TIBC 204 (L) 03/19/2017   UIBC 153 03/19/2017   IRONPCTSAT 25 03/19/2017   Lab Results  Component Value Date   RETICCTPCT 0.6 03/19/2017   RBC 3.33 (L) 04/11/2017   RETICCTABS 35.9 12/25/2014   Lab Results  Component Value Date   KPAFRELGTCHN 7.11 (H) 05/07/2015   LAMBDASER 1.65 05/07/2015   KAPLAMBRATIO 28.70 (H) 02/17/2017   Lab Results  Component Value Date   IGGSERUM 5,814 (H) 02/17/2017   IGA 116 05/07/2015   IGMSERUM 13 (L) 02/17/2017   Lab Results  Component Value Date   TOTALPROTELP 7.8 05/07/2015   ALBUMINELP 4.2 05/07/2015   A1GS 0.3 05/07/2015   A2GS 0.8 05/07/2015   BETS 0.4 05/07/2015   BETA2SER 0.3 05/07/2015   GAMS 1.9 (H) 05/07/2015   MSPIKE 4.2 (H) 02/17/2017   SPEI * 05/07/2015     Chemistry      Component Value Date/Time   NA 139 04/11/2017 0821   NA 139 02/20/2016 1207   K 3.1 (L) 04/11/2017 0821   K 3.8 02/20/2016 1207   CL 106 04/11/2017 0821   CO2 23 04/11/2017 0821   CO2 29 02/20/2016 1207   BUN 15 04/11/2017 0821   BUN 22.3 02/20/2016 1207   CREATININE 1.2 04/11/2017 0821   CREATININE 1.3 (H) 02/20/2016 1207      Component Value Date/Time   CALCIUM 9.0 04/11/2017 0821   CALCIUM 9.7 02/20/2016 1207   ALKPHOS 41 04/11/2017 0821   ALKPHOS 62 02/20/2016 1207   AST 33 04/11/2017  0821   AST 27 02/20/2016 1207   ALT 16 04/11/2017 0821   ALT 19 02/20/2016 1207   BILITOT 0.80 04/11/2017 0821   BILITOT 0.44 02/20/2016 1207      Impression  and Plan: Ms. Finks is a very pleasant 61 yo African American female with IgG myeloma.   Again, she is starting to look better.  I am glad for this.  Her calcium is better.  We will now start her on Kyprolis/Cytoxan.  I think this will work.  Hopefully, will see a relatively quick response.  We will have to follow her closely.  We will check her erythropoietin level.  I do not think that she would be to accepting of getting Aranesp.  She just does not like getting many medications.  We will go ahead and plan to get her back to see [place] in another month.  We will have to check her labs weekly when she has her chemotherapy.    I spent about 30 minutes with she and her husband today.  I answered all their questions.  I reviewed all other labs.  She is ready to go with this next course of therapy.     [name], MD 12/3/20189:37 AM

## 2017-04-11 NOTE — Patient Instructions (Signed)
[facility] Discharge Instructions for Patients Receiving Chemotherapy  Today you received the following chemotherapy agents Cytoxan and Kyprolis.  To help prevent nausea and vomiting after your treatment, we encourage you to take your nausea medication as directed but NO ZOFRAN FOR 3 DAYS.    If you develop nausea and vomiting that is not controlled by your nausea medication, call the clinic.   BELOW ARE SYMPTOMS THAT SHOULD BE REPORTED IMMEDIATELY:  *FEVER GREATER THAN 100.5 F  *CHILLS WITH OR WITHOUT FEVER  NAUSEA AND VOMITING THAT IS NOT CONTROLLED WITH YOUR NAUSEA MEDICATION  *UNUSUAL SHORTNESS OF BREATH  *UNUSUAL BRUISING OR BLEEDING  TENDERNESS IN MOUTH AND THROAT WITH OR WITHOUT PRESENCE OF ULCERS  *URINARY PROBLEMS  *BOWEL PROBLEMS  UNUSUAL RASH Items with * indicate a potential emergency and should be followed up as soon as possible.  Feel free to call the clinic you have any questions or concerns. The clinic phone number is [phone].  Please show the [facility] at check-in to the Emergency Department and triage nurse.   Cyclophosphamide injection What is this medicine? CYCLOPHOSPHAMIDE (sye kloe FOSS fa mide) is a chemotherapy drug. It slows the growth of cancer cells. This medicine is used to treat many types of cancer like lymphoma, myeloma, leukemia, breast cancer, and ovarian cancer, to name a few. This medicine may be used for other purposes; ask your health care provider or pharmacist if you have questions. COMMON BRAND NAME(S): Cytoxan, Neosar What should I tell my health care provider before I take this medicine? They need to know if you have any of these conditions: -blood disorders -history of other chemotherapy -infection -kidney disease -liver disease -recent or ongoing radiation therapy -tumors in the bone marrow -an unusual or allergic reaction to cyclophosphamide, other chemotherapy, other medicines, foods, dyes, or  preservatives -pregnant or trying to get pregnant -breast-feeding How should I use this medicine? This drug is usually given as an injection into a vein or muscle or by infusion into a vein. It is administered in a hospital or clinic by a specially trained health care professional. Talk to your pediatrician regarding the use of this medicine in children. Special care may be needed. Overdosage: If you think you have taken too much of this medicine contact a poison control center or emergency room at once. NOTE: This medicine is only for you. Do not share this medicine with others. What if I miss a dose? It is important not to miss your dose. Call your doctor or health care professional if you are unable to keep an appointment. What may interact with this medicine? This medicine may interact with the following medications: -amiodarone -amphotericin B -azathioprine -certain antiviral medicines for HIV or AIDS such as protease inhibitors (e.g., indinavir, ritonavir) and zidovudine -certain blood pressure medications such as benazepril, captopril, enalapril, fosinopril, lisinopril, moexipril, monopril, perindopril, quinapril, ramipril, trandolapril -certain cancer medications such as anthracyclines (e.g., daunorubicin, doxorubicin), busulfan, cytarabine, paclitaxel, pentostatin, tamoxifen, trastuzumab -certain diuretics such as chlorothiazide, chlorthalidone, hydrochlorothiazide, indapamide, metolazone -certain medicines that treat or prevent blood clots like warfarin -certain muscle relaxants such as succinylcholine -cyclosporine -etanercept -indomethacin -medicines to increase blood counts like filgrastim, pegfilgrastim, sargramostim -medicines used as general anesthesia -metronidazole -natalizumab This list may not describe all possible interactions. Give your health care provider a list of all the medicines, herbs, non-prescription drugs, or dietary supplements you use. Also tell them if  you smoke, drink alcohol, or use illegal drugs. Some items may interact  with your medicine. What should I watch for while using this medicine? Visit your doctor for checks on your progress. This drug may make you feel generally unwell. This is not uncommon, as chemotherapy can affect healthy cells as well as cancer cells. Report any side effects. Continue your course of treatment even though you feel ill unless your doctor tells you to stop. Drink water or other fluids as directed. Urinate often, even at night. In some cases, you may be given additional medicines to help with side effects. Follow all directions for their use. Call your doctor or health care professional for advice if you get a fever, chills or sore throat, or other symptoms of a cold or flu. Do not treat yourself. This drug decreases your body's ability to fight infections. Try to avoid being around people who are sick. This medicine may increase your risk to bruise or bleed. Call your doctor or health care professional if you notice any unusual bleeding. Be careful brushing and flossing your teeth or using a toothpick because you may get an infection or bleed more easily. If you have any dental work done, tell your dentist you are receiving this medicine. You may get drowsy or dizzy. Do not drive, use machinery, or do anything that needs mental alertness until you know how this medicine affects you. Do not become pregnant while taking this medicine or for 1 year after stopping it. Women should inform their doctor if they wish to become pregnant or think they might be pregnant. Men should not father a child while taking this medicine and for 4 months after stopping it. There is a potential for serious side effects to an unborn child. Talk to your health care professional or pharmacist for more information. Do not breast-feed an infant while taking this medicine. This medicine may interfere with the ability to have a child. This medicine  has caused ovarian failure in some women. This medicine has caused reduced sperm counts in some men. You should talk with your doctor or health care professional if you are concerned about your fertility. If you are going to have surgery, tell your doctor or health care professional that you have taken this medicine. What side effects may I notice from receiving this medicine? Side effects that you should report to your doctor or health care professional as soon as possible: -allergic reactions like skin rash, itching or hives, swelling of the face, lips, or tongue -low blood counts - this medicine may decrease the number of white blood cells, red blood cells and platelets. You may be at increased risk for infections and bleeding. -signs of infection - fever or chills, cough, sore throat, pain or difficulty passing urine -signs of decreased platelets or bleeding - bruising, pinpoint red spots on the skin, black, tarry stools, blood in the urine -signs of decreased red blood cells - unusually weak or tired, fainting spells, lightheadedness -breathing problems -dark urine -dizziness -palpitations -swelling of the ankles, feet, hands -trouble passing urine or change in the amount of urine -weight gain -yellowing of the eyes or skin Side effects that usually do not require medical attention (report to your doctor or health care professional if they continue or are bothersome): -changes in nail or skin color -hair loss -missed menstrual periods -mouth sores -nausea, vomiting This list may not describe all possible side effects. Call your doctor for medical advice about side effects. You may report side effects to FDA at 1-800-FDA-1088. Where should I keep my medicine?  This drug is given in a hospital or clinic and will not be stored at home. NOTE: This sheet is a summary. It may not cover all possible information. If you have questions about this medicine, talk to your doctor, pharmacist, or  health care provider.  2018 [employer]vier/Gold Standard (2012-03-10 16:22:58)

## 2017-04-12 ENCOUNTER — Encounter: Payer: Self-pay | Admitting: Hematology & Oncology

## 2017-04-12 ENCOUNTER — Ambulatory Visit (HOSPITAL_BASED_OUTPATIENT_CLINIC_OR_DEPARTMENT_OTHER): Payer: 59

## 2017-04-12 ENCOUNTER — Other Ambulatory Visit: Payer: Self-pay

## 2017-04-12 ENCOUNTER — Other Ambulatory Visit: Payer: Self-pay | Admitting: Family

## 2017-04-12 DIAGNOSIS — Z5112 Encounter for antineoplastic immunotherapy: Secondary | ICD-10-CM | POA: Diagnosis not present

## 2017-04-12 DIAGNOSIS — N3000 Acute cystitis without hematuria: Secondary | ICD-10-CM

## 2017-04-12 DIAGNOSIS — C9000 Multiple myeloma not having achieved remission: Secondary | ICD-10-CM | POA: Diagnosis not present

## 2017-04-12 DIAGNOSIS — C9002 Multiple myeloma in relapse: Secondary | ICD-10-CM

## 2017-04-12 LAB — KAPPA/LAMBDA LIGHT CHAINS
IG KAPPA FREE LIGHT CHAIN: 162.7 mg/L — AB (ref 3.3–19.4)
IG LAMBDA FREE LIGHT CHAIN: 5.7 mg/L (ref 5.7–26.3)
Kappa/Lambda FluidC Ratio: 28.54 — ABNORMAL HIGH (ref 0.26–1.65)

## 2017-04-12 LAB — IGG, IGA, IGM
IGA/IMMUNOGLOBULIN A, SERUM: 14 mg/dL — AB (ref 87–352)
IgM, Qn, Serum: 11 mg/dL — ABNORMAL LOW (ref 26–217)

## 2017-04-12 LAB — ERYTHROPOIETIN: Erythropoietin: 14.3 m[IU]/mL (ref 2.6–18.5)

## 2017-04-12 MED ORDER — SODIUM CHLORIDE 0.9% FLUSH
10.0000 mL | INTRAVENOUS | Status: DC | PRN
  Administered 2017-04-12: 10 mL
  Filled 2017-04-12: qty 10

## 2017-04-12 MED ORDER — HEPARIN SOD (PORK) LOCK FLUSH 100 UNIT/ML IV SOLN
500.0000 [IU] | Freq: Once | INTRAVENOUS | Status: AC | PRN
  Administered 2017-04-12: 500 [IU]
  Filled 2017-04-12: qty 5

## 2017-04-12 MED ORDER — SODIUM CHLORIDE 0.9 % IV SOLN
Freq: Once | INTRAVENOUS | Status: AC
Start: 2017-04-12 — End: 2017-04-12
  Administered 2017-04-12: 09:00:00 via INTRAVENOUS

## 2017-04-12 MED ORDER — CARFILZOMIB CHEMO INJECTION 60 MG
20.0000 mg/m2 | Freq: Once | INTRAVENOUS | Status: AC
  Administered 2017-04-12: 38 mg via INTRAVENOUS
  Filled 2017-04-12: qty 19

## 2017-04-12 MED ORDER — DEXAMETHASONE SODIUM PHOSPHATE 10 MG/ML IJ SOLN
INTRAMUSCULAR | Status: AC
  Filled 2017-04-12: qty 1

## 2017-04-12 MED ORDER — DEXAMETHASONE SODIUM PHOSPHATE 10 MG/ML IJ SOLN
10.0000 mg | Freq: Once | INTRAMUSCULAR | Status: AC
  Administered 2017-04-12: 10 mg via INTRAVENOUS

## 2017-04-12 MED ORDER — SODIUM CHLORIDE 0.9 % IV SOLN: Freq: Once | INTRAVENOUS | Status: DC

## 2017-04-12 NOTE — Patient Instructions (Signed)
[facility] Discharge Instructions for Patients Receiving Chemotherapy  Today you received the following chemotherapy agents kyprolis.   To help prevent nausea and vomiting after your treatment, we encourage you to take your nausea medication as directed BUT NO ZOFRAN FOR 2 MORE DAYS - COMPAZINE INSTEAD.   If you develop nausea and vomiting that is not controlled by your nausea medication, call the clinic.   BELOW ARE SYMPTOMS THAT SHOULD BE REPORTED IMMEDIATELY:  *FEVER GREATER THAN 100.5 F  *CHILLS WITH OR WITHOUT FEVER  NAUSEA AND VOMITING THAT IS NOT CONTROLLED WITH YOUR NAUSEA MEDICATION  *UNUSUAL SHORTNESS OF BREATH  *UNUSUAL BRUISING OR BLEEDING  TENDERNESS IN MOUTH AND THROAT WITH OR WITHOUT PRESENCE OF ULCERS  *URINARY PROBLEMS  *BOWEL PROBLEMS  UNUSUAL RASH Items with * indicate a potential emergency and should be followed up as soon as possible.  Feel free to call the clinic you have any questions or concerns. The clinic phone number is [phone].  Please show the [facility] at check-in to the Emergency Department and triage nurse.

## 2017-04-12 NOTE — Progress Notes (Signed)
Patient reports she feels well since chemo yesterday and she is back for day #2 today. No concerns or questions.

## 2017-04-14 LAB — PROTEIN ELECTROPHORESIS, SERUM, WITH REFLEX
A/G Ratio: 0.4 — ABNORMAL LOW (ref 0.7–1.7)
ALPHA 1: 0.3 g/dL (ref 0.0–0.4)
ALPHA 2: 0.8 g/dL (ref 0.4–1.0)
Albumin: 3.4 g/dL (ref 2.9–4.4)
BETA: 1 g/dL (ref 0.7–1.3)
Gamma Globulin: 5.7 g/dL — ABNORMAL HIGH (ref 0.4–1.8)
Globulin, Total: 7.7 g/dL — ABNORMAL HIGH (ref 2.2–3.9)
INTERPRETATION(SEE BELOW): 0
M-SPIKE, %: 5.5 g/dL — AB
Total Protein: 11.1 g/dL — ABNORMAL HIGH (ref 6.0–8.5)

## 2017-04-15 ENCOUNTER — Other Ambulatory Visit: Payer: Self-pay

## 2017-04-15 DIAGNOSIS — C9000 Multiple myeloma not having achieved remission: Secondary | ICD-10-CM

## 2017-04-18 ENCOUNTER — Ambulatory Visit: Payer: 59

## 2017-04-18 ENCOUNTER — Other Ambulatory Visit: Payer: 59

## 2017-04-19 ENCOUNTER — Ambulatory Visit (HOSPITAL_BASED_OUTPATIENT_CLINIC_OR_DEPARTMENT_OTHER): Payer: 59

## 2017-04-19 ENCOUNTER — Encounter: Payer: Self-pay | Admitting: Hematology & Oncology

## 2017-04-19 ENCOUNTER — Other Ambulatory Visit (HOSPITAL_BASED_OUTPATIENT_CLINIC_OR_DEPARTMENT_OTHER): Payer: 59

## 2017-04-19 ENCOUNTER — Ambulatory Visit (HOSPITAL_COMMUNITY)
Admission: RE | Admit: 2017-04-19 | Discharge: 2017-04-19 | Disposition: A | Discharge status: Home / Self Care | Payer: 59 | Source: Ambulatory Visit | Attending: Hematology & Oncology | Admitting: Hematology & Oncology

## 2017-04-19 ENCOUNTER — Ambulatory Visit (HOSPITAL_BASED_OUTPATIENT_CLINIC_OR_DEPARTMENT_OTHER): Payer: 59 | Admitting: Hematology & Oncology

## 2017-04-19 ENCOUNTER — Other Ambulatory Visit: Payer: Self-pay

## 2017-04-19 ENCOUNTER — Ambulatory Visit: Payer: 59

## 2017-04-19 VITALS — BP 117/54 | HR 91 | Temp 98.8°F | Wt 174.1 lb

## 2017-04-19 DIAGNOSIS — R109 Unspecified abdominal pain: Secondary | ICD-10-CM

## 2017-04-19 DIAGNOSIS — C9000 Multiple myeloma not having achieved remission: Secondary | ICD-10-CM

## 2017-04-19 DIAGNOSIS — Z5112 Encounter for antineoplastic immunotherapy: Secondary | ICD-10-CM | POA: Diagnosis not present

## 2017-04-19 DIAGNOSIS — M899 Disorder of bone, unspecified: Secondary | ICD-10-CM

## 2017-04-19 DIAGNOSIS — C9002 Multiple myeloma in relapse: Secondary | ICD-10-CM

## 2017-04-19 LAB — CBC WITH DIFFERENTIAL (CANCER CENTER ONLY)
BASO#: 0 10*3/uL (ref 0.0–0.2)
BASO%: 0.4 % (ref 0.0–2.0)
EOS ABS: 0 10*3/uL (ref 0.0–0.5)
EOS%: 1.1 % (ref 0.0–7.0)
HEMATOCRIT: 25.9 % — AB (ref 34.8–46.6)
HGB: 8.6 g/dL — ABNORMAL LOW (ref 11.6–15.9)
LYMPH#: 0.8 10*3/uL — ABNORMAL LOW (ref 0.9–3.3)
LYMPH%: 26.3 % (ref 14.0–48.0)
MCH: 28.3 pg (ref 26.0–34.0)
MCHC: 33.2 g/dL (ref 32.0–36.0)
MCV: 85 fL (ref 81–101)
MONO#: 0.4 10*3/uL (ref 0.1–0.9)
MONO%: 15.1 % — ABNORMAL HIGH (ref 0.0–13.0)
NEUT#: 1.6 10*3/uL (ref 1.5–6.5)
NEUT%: 57.1 % (ref 39.6–80.0)
Platelets: 67 10*3/uL — ABNORMAL LOW (ref 145–400)
RBC: 3.04 10*6/uL — ABNORMAL LOW (ref 3.70–5.32)
RDW: 15.3 % (ref 11.1–15.7)
WBC: 2.9 10*3/uL — ABNORMAL LOW (ref 3.9–10.0)

## 2017-04-19 LAB — CMP (CANCER CENTER ONLY)
ALK PHOS: 44 U/L (ref 26–84)
ALT: 17 U/L (ref 10–47)
AST: 27 U/L (ref 11–38)
Albumin: 2.6 g/dL — ABNORMAL LOW (ref 3.3–5.5)
BUN, Bld: 14 mg/dL (ref 7–22)
CALCIUM: 9.2 mg/dL (ref 8.0–10.3)
CO2: 25 mEq/L (ref 18–33)
Chloride: 105 mEq/L (ref 98–108)
Creat: 0.9 mg/dl (ref 0.6–1.2)
GLUCOSE: 84 mg/dL (ref 73–118)
POTASSIUM: 3.6 meq/L (ref 3.3–4.7)
Sodium: 143 mEq/L (ref 128–145)
TOTAL PROTEIN: 10.4 g/dL — AB (ref 6.4–8.1)
Total Bilirubin: 0.9 mg/dl (ref 0.20–1.60)

## 2017-04-19 MED ORDER — HEPARIN SOD (PORK) LOCK FLUSH 100 UNIT/ML IV SOLN
500.0000 [IU] | Freq: Once | INTRAVENOUS | Status: AC | PRN
  Administered 2017-04-19: 500 [IU]
  Filled 2017-04-19: qty 5

## 2017-04-19 MED ORDER — DEXTROSE 5 % IV SOLN
28.8000 mg/m2 | Freq: Once | INTRAVENOUS | Status: AC
  Administered 2017-04-19: 54 mg via INTRAVENOUS
  Filled 2017-04-19: qty 27

## 2017-04-19 MED ORDER — DIAZEPAM 2 MG PO TABS: 4.0000 mg | ORAL_TABLET | Freq: Three times a day (TID) | ORAL | 0 refills | Status: DC | PRN

## 2017-04-19 MED ORDER — DEXAMETHASONE SODIUM PHOSPHATE 10 MG/ML IJ SOLN
INTRAMUSCULAR | Status: AC
  Filled 2017-04-19: qty 1

## 2017-04-19 MED ORDER — DEXAMETHASONE SODIUM PHOSPHATE 10 MG/ML IJ SOLN
10.0000 mg | Freq: Once | INTRAMUSCULAR | Status: AC
  Administered 2017-04-19: 10 mg via INTRAVENOUS

## 2017-04-19 MED ORDER — SODIUM CHLORIDE 0.9% FLUSH
10.0000 mL | INTRAVENOUS | Status: DC | PRN
  Administered 2017-04-19: 10 mL
  Filled 2017-04-19: qty 10

## 2017-04-19 MED ORDER — PALONOSETRON HCL INJECTION 0.25 MG/5ML
0.2500 mg | Freq: Once | INTRAVENOUS | Status: AC
  Administered 2017-04-19: 0.25 mg via INTRAVENOUS

## 2017-04-19 MED ORDER — DIAZEPAM 5 MG/ML IJ SOLN
INTRAMUSCULAR | Status: AC
  Filled 2017-04-19: qty 2

## 2017-04-19 MED ORDER — PALONOSETRON HCL INJECTION 0.25 MG/5ML
INTRAVENOUS | Status: AC
  Filled 2017-04-19: qty 5

## 2017-04-19 MED ORDER — SODIUM CHLORIDE 0.9% FLUSH
10.0000 mL | Freq: Once | INTRAVENOUS | Status: AC
  Administered 2017-04-19: 10 mL
  Filled 2017-04-19: qty 10

## 2017-04-19 MED ORDER — SODIUM CHLORIDE 0.9 % IV SOLN
Freq: Once | INTRAVENOUS | Status: AC
  Administered 2017-04-19: 12:00:00 via INTRAVENOUS

## 2017-04-19 MED ORDER — DIAZEPAM 5 MG/ML IJ SOLN
5.0000 mg | Freq: Once | INTRAMUSCULAR | Status: AC
  Administered 2017-04-19: 5 mg via INTRAVENOUS

## 2017-04-19 NOTE — Patient Instructions (Signed)
[facility] Discharge Instructions for Patients Receiving Chemotherapy  Today you received the following chemotherapy agents:  Kyprolis  To help prevent nausea and vomiting after your treatment, we encourage you to take your nausea medication as prescribed.   If you develop nausea and vomiting that is not controlled by your nausea medication, call the clinic.   BELOW ARE SYMPTOMS THAT SHOULD BE REPORTED IMMEDIATELY:  *FEVER GREATER THAN 100.5 F  *CHILLS WITH OR WITHOUT FEVER  NAUSEA AND VOMITING THAT IS NOT CONTROLLED WITH YOUR NAUSEA MEDICATION  *UNUSUAL SHORTNESS OF BREATH  *UNUSUAL BRUISING OR BLEEDING  TENDERNESS IN MOUTH AND THROAT WITH OR WITHOUT PRESENCE OF ULCERS  *URINARY PROBLEMS  *BOWEL PROBLEMS  UNUSUAL RASH Items with * indicate a potential emergency and should be followed up as soon as possible.  Feel free to call the clinic should you have any questions or concerns. The clinic phone number is [phone].  Please show the CHEMO ALERT CARD at check-in to the Emergency Department and triage nurse.   

## 2017-04-19 NOTE — Progress Notes (Signed)
Hematology and Oncology Follow Up Visit  Barbara Cook [id number] [birth year] 61 y.o. 04/19/2017   Principle Diagnosis:  IgG kappa myeloma Hypercalcemia of malignancy  Current Therapy:   Daratumumab q weekly therapy - s/p cycle #3 - d/c'ed due to progression of myeloma Kyprolis/Cytoxan/Decadron - cycle #1 to start on 04/11/2017 Zometa every 3 months    Interim History:  Barbara Cook is here today with her husband.  She is having some issues with abdominal pain.  This appears to be musculoskeletal.  She says that the pain comes around both sides.  There is no nausea or vomiting.  She had no diarrhea.  She has had no dysuria..  She says it hurts most when she tries to stand up from sitting or lying down.  I does wonder if there is not a plasmacytoma in the back.  I think she clearly needs to have an MRI done.   She seems to think that part of the chemotherapy might be causing problems.  I try to convince her that I did not think that chemotherapy was causing this.  When we saw her a week ago, her M spike was up to 5.5 g/dL.  Her IgG level was 7400 mg/dL.  Her kappa light chain was 16.3 mg/dL.  I try to convince her that we really need to "push through" with treatment.  This I think is her only chance of trying to get this myeloma under better control.  She has had no cough.  There is no shortness of breath.  She does get fatigued fairly easily.  We did check her erythropoietin level last week.  It is low at 15.  As such, she might benefit from Aranesp.  Currently, her performance status is ECOG 2.  Medications:  Allergies as of 04/19/2017      Reactions   Codeine Palpitations      Medication List        Accurate as of 04/19/17  1:59 PM. Always use your most recent med list.          acetaminophen 500 MG tablet Commonly known as:  TYLENOL Take 500 mg by mouth every 6 (six) hours as needed for mild pain or headache.   acyclovir 400 MG tablet Commonly known as:   ZOVIRAX Take 1 tablet (400 mg total) by mouth daily.   benzonatate 100 MG capsule Commonly known as:  TESSALON PERLES Take 2 capsules (200 mg total) 3 (three) times daily as needed by mouth for cough.   diazepam 2 MG tablet Commonly known as:  VALIUM Take 2 tablets (4 mg total) by mouth every 8 (eight) hours as needed for anxiety.   hyoscyamine 0.125 MG SL tablet Commonly known as:  LEVSIN SL Place 1 tablet (0.125 mg total) under the tongue every 4 (four) hours as needed.   magnesium oxide 400 (241.3 Mg) MG tablet Commonly known as:  MAG-OX TAKE 1 TABLET BY MOUTH TWICE A DAY   montelukast 10 MG tablet Commonly known as:  SINGULAIR Take 1 pill daily for 5 days starting today.   multivitamin tablet Take 1 tablet by mouth every evening.   ondansetron 8 MG tablet Commonly known as:  ZOFRAN TAKE 1 TABLET BY MOUTH TWICE A DAY FOR NAUSEA AND VOMITING 1 TABLET 1 HOUR PRIOR TO CHEMOTHERAPY   oseltamivir 75 MG capsule Commonly known as:  TAMIFLU   pantoprazole 40 MG tablet Commonly known as:  PROTONIX Take 1 tablet (40 mg total) by mouth daily.  polyethylene glycol powder powder Commonly known as:  GLYCOLAX/MIRALAX 1 capful daily as needed   prochlorperazine 10 MG tablet Commonly known as:  COMPAZINE Take 1 tablet (10 mg total) by mouth every 6 (six) hours as needed for nausea or vomiting.   torsemide 20 MG tablet Commonly known as:  DEMADEX Take 1 tablet (20 mg total) by mouth daily as needed.   traMADol 50 MG tablet Commonly known as:  ULTRAM Take 1 tablet (50 mg total) every 6 (six) hours as needed by mouth.   vitamin B-6 250 MG tablet Take 1 tablet (250 mg total) by mouth daily.   Vitamin D (Ergocalciferol) 50000 units Caps capsule Commonly known as:  DRISDOL TAKE 1 CAPSULE (50,000 UNITS TOTAL) BY MOUTH ONCE A WEEK.       Allergies:  Allergies  Allergen Reactions  . Codeine Palpitations    Past Medical History, Surgical history, Social history, and  Family History were reviewed and updated.  Review of Systems: As stated in the interim history.   Physical Exam:  weight is 174 lb 1.6 oz (79 kg). Her oral temperature is 98.8 F (37.1 C). Her blood pressure is 117/54 (abnormal) and her pulse is 91.   Wt Readings from Last 3 Encounters:  04/19/17 174 lb 1.6 oz (79 kg)  04/11/17 167 lb (75.8 kg)  03/20/17 172 lb 9.9 oz (78.3 kg)    Chronically ill-appearing African-American female.  She is in mild distress secondary to abdominal pain.  Head and neck exam shows no ocular or oral lesions.  She has no adenopathy in the neck.  Lungs are clear bilaterally.  Cardiac exam regular rate and rhythm with no murmurs, rubs or bruits.  Abdomen is soft.  She has some tenderness around upper abdomen.  She has decent bowel sounds.  There is no palpable abdominal mass.  There is no palpable liver or spleen tip.  Back exam shows some tenderness in the mid thoracic region.  She may have some slight muscle spasms.  Extremities shows no clubbing, cyanosis or edema.  She has decent strength in her legs.  Skin exam shows no rashes.  Neurological exam is nonfocal.       Lab Results  Component Value Date   WBC 2.9 (L) 04/19/2017   HGB 8.6 (L) 04/19/2017   HCT 25.9 (L) 04/19/2017   MCV 85 04/19/2017   PLT 67 (L) 04/19/2017   Lab Results  Component Value Date   FERRITIN 183 03/19/2017   IRON 51 03/19/2017   TIBC 204 (L) 03/19/2017   UIBC 153 03/19/2017   IRONPCTSAT 25 03/19/2017   Lab Results  Component Value Date   RETICCTPCT 0.6 03/19/2017   RBC 3.04 (L) 04/19/2017   RETICCTABS 35.9 12/25/2014   Lab Results  Component Value Date   KPAFRELGTCHN 7.11 (H) 05/07/2015   LAMBDASER 1.65 05/07/2015   KAPLAMBRATIO 28.54 (H) 04/11/2017   Lab Results  Component Value Date   IGGSERUM 7,380 (H) 04/11/2017   IGA 116 05/07/2015   IGMSERUM 11 (L) 04/11/2017   Lab Results  Component Value Date   TOTALPROTELP 7.8 05/07/2015   ALBUMINELP 4.2 05/07/2015    A1GS 0.3 05/07/2015   A2GS 0.8 05/07/2015   BETS 0.4 05/07/2015   BETA2SER 0.3 05/07/2015   GAMS 1.9 (H) 05/07/2015   MSPIKE 5.5 (H) 04/11/2017   SPEI * 05/07/2015     Chemistry      Component Value Date/Time   NA 143 04/19/2017 1035   NA 139 02/20/2016  1207   K 3.6 04/19/2017 1035   K 3.8 02/20/2016 1207   CL 105 04/19/2017 1035   CO2 25 04/19/2017 1035   CO2 29 02/20/2016 1207   BUN 14 04/19/2017 1035   BUN 22.3 02/20/2016 1207   CREATININE 0.9 04/19/2017 1035   CREATININE 1.3 (H) 02/20/2016 1207      Component Value Date/Time   CALCIUM 9.2 04/19/2017 1035   CALCIUM 9.7 02/20/2016 1207   ALKPHOS 44 04/19/2017 1035   ALKPHOS 62 02/20/2016 1207   AST 27 04/19/2017 1035   AST 27 02/20/2016 1207   ALT 17 04/19/2017 1035   ALT 19 02/20/2016 1207   BILITOT 0.90 04/19/2017 1035   BILITOT 0.44 02/20/2016 1207      Impression and Plan: Ms. Ganaway is a very pleasant 61 yo African American female with IgG Kappa myeloma.   I really think that this abdominal pain is radicular from the back.  It comes around both sides of the abdomen.  It appears to be concentrated to a dermatome.  While she was getting treatment today, I did go ahead and give her some IV Valium.  She said that the Valium did help.  I went ahead and gave her a prescription for Valium.  It is 2 mg.  I told her to take 2-4 mg every 8 hours as needed for her discomfort.  Again, I believe that her myeloma clearly is the primary issue.  We really have to treat her and try to be aggressive with therapy if possible.  Hopefully the MRI can be done this week.  I would like to give her 2 units of blood.  I think this would be a lot easier for her than Aranesp.  I know she would worry about the side effects of Aranesp.  Again I do realize that trying to crossmatch her is very difficult because of the fact that she had received daratumumab in the past.  I spent a good 45 minutes with she and her husband.  I just feel bad  for her that she is regressing.  Hopefully, with Kyprolis therapy, we will see the decrease in her myeloma studies.    We will plan to get her back in 1 week and see how she is doing.  She will come in later this week for a transfusion.      [name], MD 12/11/20181:59 PM

## 2017-04-19 NOTE — Patient Instructions (Signed)
Implanted Port Home Guide An implanted port is a type of central line that is placed under the skin. Central lines are used to provide IV access when treatment or nutrition needs to be given through a person's veins. Implanted ports are used for long-term IV access. An implanted port may be placed because:  You need IV medicine that would be irritating to the small veins in your hands or arms.  You need long-term IV medicines, such as antibiotics.  You need IV nutrition for a long period.  You need frequent blood draws for lab tests.  You need dialysis.  Implanted ports are usually placed in the chest area, but they can also be placed in the upper arm, the abdomen, or the leg. An implanted port has two main parts:  Reservoir. The reservoir is round and will appear as a small, raised area under your skin. The reservoir is the part where a needle is inserted to give medicines or draw blood.  Catheter. The catheter is a thin, flexible tube that extends from the reservoir. The catheter is placed into a large vein. Medicine that is inserted into the reservoir goes into the catheter and then into the vein.  How will I care for my incision site? Do not get the incision site wet. Bathe or shower as directed by your health care provider. How is my port accessed? Special steps must be taken to access the port:  Before the port is accessed, a numbing cream can be placed on the skin. This helps numb the skin over the port site.  Your health care provider uses a sterile technique to access the port. ? Your health care provider must put on a mask and sterile gloves. ? The skin over your port is cleaned carefully with an antiseptic and allowed to dry. ? The port is gently pinched between sterile gloves, and a needle is inserted into the port.  Only "non-coring" port needles should be used to access the port. Once the port is accessed, a blood return should be checked. This helps ensure that the port  is in the vein and is not clogged.  If your port needs to remain accessed for a constant infusion, a clear (transparent) bandage will be placed over the needle site. The bandage and needle will need to be changed every week, or as directed by your health care provider.  Keep the bandage covering the needle clean and dry. Do not get it wet. Follow your health care provider's instructions on how to take a shower or bath while the port is accessed.  If your port does not need to stay accessed, no bandage is needed over the port.  What is flushing? Flushing helps keep the port from getting clogged. Follow your health care provider's instructions on how and when to flush the port. Ports are usually flushed with saline solution or a medicine called heparin. The need for flushing will depend on how the port is used.  If the port is used for intermittent medicines or blood draws, the port will need to be flushed: ? After medicines have been given. ? After blood has been drawn. ? As part of routine maintenance.  If a constant infusion is running, the port may not need to be flushed.  How long will my port stay implanted? The port can stay in for as long as your health care provider thinks it is needed. When it is time for the port to come out, surgery will be   done to remove it. The procedure is similar to the one performed when the port was put in. When should I seek immediate medical care? When you have an implanted port, you should seek immediate medical care if:  You notice a bad smell coming from the incision site.  You have swelling, redness, or drainage at the incision site.  You have more swelling or pain at the port site or the surrounding area.  You have a fever that is not controlled with medicine.  This information is not intended to replace advice given to you by your health care provider. Make sure you discuss any questions you have with your health care provider. Document  Released: 04/26/2005 Document Revised: 10/02/2015 Document Reviewed: 01/01/2013 Elsevier Interactive Patient Education  2017 [employer]  

## 2017-04-19 NOTE — Progress Notes (Signed)
Ok to treat with thrombocytopenia per Dr. Marin Olp.

## 2017-04-20 ENCOUNTER — Ambulatory Visit (HOSPITAL_BASED_OUTPATIENT_CLINIC_OR_DEPARTMENT_OTHER): Payer: 59

## 2017-04-20 ENCOUNTER — Other Ambulatory Visit: Payer: Self-pay

## 2017-04-20 DIAGNOSIS — C9002 Multiple myeloma in relapse: Secondary | ICD-10-CM

## 2017-04-20 DIAGNOSIS — C9000 Multiple myeloma not having achieved remission: Secondary | ICD-10-CM

## 2017-04-20 DIAGNOSIS — Z5112 Encounter for antineoplastic immunotherapy: Secondary | ICD-10-CM | POA: Diagnosis not present

## 2017-04-20 LAB — KAPPA/LAMBDA LIGHT CHAINS
IG KAPPA FREE LIGHT CHAIN: 69.1 mg/L — AB (ref 3.3–19.4)
Ig Lambda Free Light Chain: 2.8 mg/L — ABNORMAL LOW (ref 5.7–26.3)
Kappa/Lambda FluidC Ratio: 24.68 — ABNORMAL HIGH (ref 0.26–1.65)

## 2017-04-20 LAB — IGG, IGA, IGM
IgA, Qn, Serum: 13 mg/dL — ABNORMAL LOW (ref 87–352)
IgM, Qn, Serum: 12 mg/dL — ABNORMAL LOW (ref 26–217)

## 2017-04-20 LAB — PREPARE RBC (CROSSMATCH)

## 2017-04-20 LAB — LACTATE DEHYDROGENASE: LDH: 328 U/L — AB (ref 125–245)

## 2017-04-20 MED ORDER — SODIUM CHLORIDE 0.9 % IV SOLN: Freq: Once | INTRAVENOUS | Status: AC

## 2017-04-20 MED ORDER — SODIUM CHLORIDE 0.9 % IV SOLN
Freq: Once | INTRAVENOUS | Status: AC
  Administered 2017-04-20: 10:00:00 via INTRAVENOUS

## 2017-04-20 MED ORDER — DEXAMETHASONE SODIUM PHOSPHATE 10 MG/ML IJ SOLN
10.0000 mg | Freq: Once | INTRAMUSCULAR | Status: AC
  Administered 2017-04-20: 10 mg via INTRAVENOUS

## 2017-04-20 MED ORDER — DEXAMETHASONE SODIUM PHOSPHATE 10 MG/ML IJ SOLN
INTRAMUSCULAR | Status: AC
Start: 2017-04-20 — End: ?
  Filled 2017-04-20: qty 1

## 2017-04-20 MED ORDER — SODIUM CHLORIDE 0.9% FLUSH
10.0000 mL | INTRAVENOUS | Status: DC | PRN
  Administered 2017-04-20: 10 mL
  Filled 2017-04-20: qty 10

## 2017-04-20 MED ORDER — HEPARIN SOD (PORK) LOCK FLUSH 100 UNIT/ML IV SOLN
500.0000 [IU] | Freq: Once | INTRAVENOUS | Status: AC | PRN
  Administered 2017-04-20: 500 [IU]
  Filled 2017-04-20: qty 5

## 2017-04-20 MED ORDER — DEXTROSE 5 % IV SOLN
28.8000 mg/m2 | Freq: Once | INTRAVENOUS | Status: AC
  Administered 2017-04-20: 54 mg via INTRAVENOUS
  Filled 2017-04-20: qty 27

## 2017-04-20 NOTE — Patient Instructions (Signed)
Carfilzomib injection What is this medicine? CARFILZOMIB (kar FILZ oh mib) targets a specific protein within cancer cells and stops the cancer cells from growing. It is used to treat multiple myeloma. This medicine may be used for other purposes; ask your health care provider or pharmacist if you have questions. COMMON BRAND NAME(S): KYPROLIS What should I tell my health care provider before I take this medicine? They need to know if you have any of these conditions: -heart disease -history of blood clots -irregular heartbeat -kidney disease -liver disease -lung or breathing disease -an unusual or allergic reaction to carfilzomib, or other medicines, foods, dyes, or preservatives -pregnant or trying to get pregnant -breast-feeding How should I use this medicine? This medicine is for injection or infusion into a vein. It is given by a health care professional in a hospital or clinic setting. Talk to your pediatrician regarding the use of this medicine in children. Special care may be needed. Overdosage: If you think you have taken too much of this medicine contact a poison control center or emergency room at once. NOTE: This medicine is only for you. Do not share this medicine with others. What if I miss a dose? It is important not to miss your dose. Call your doctor or health care professional if you are unable to keep an appointment. What may interact with this medicine? Interactions are not expected. Give your health care provider a list of all the medicines, herbs, non-prescription drugs, or dietary supplements you use. Also tell them if you smoke, drink alcohol, or use illegal drugs. Some items may interact with your medicine. This list may not describe all possible interactions. Give your health care provider a list of all the medicines, herbs, non-prescription drugs, or dietary supplements you use. Also tell them if you smoke, drink alcohol, or use illegal drugs. Some items may  interact with your medicine. What should I watch for while using this medicine? Your condition will be monitored carefully while you are receiving this medicine. Report any side effects. Continue your course of treatment even though you feel ill unless your doctor tells you to stop. You may need blood work done while you are taking this medicine. Do not become pregnant while taking this medicine or for at least 30 days after stopping it. Women should inform their doctor if they wish to become pregnant or think they might be pregnant. There is a potential for serious side effects to an unborn child. Men should not father a child while taking this medicine and for 90 days after stopping it. Talk to your health care professional or pharmacist for more information. Do not breast-feed an infant while taking this medicine. Check with your doctor or health care professional if you get an attack of severe diarrhea, nausea and vomiting, or if you sweat a lot. The loss of too much body fluid can make it dangerous for you to take this medicine. You may get dizzy. Do not drive, use machinery, or do anything that needs mental alertness until you know how this medicine affects you. Do not stand or sit up quickly, especially if you are an older patient. This reduces the risk of dizzy or fainting spells. What side effects may I notice from receiving this medicine? Side effects that you should report to your doctor or health care professional as soon as possible: -allergic reactions like skin rash, itching or hives, swelling of the face, lips, or tongue -confusion -dizziness -feeling faint or lightheaded -fever or chills -  palpitations -seizures -signs and symptoms of bleeding such as bloody or black, tarry stools; red or dark-brown urine; spitting up blood or brown material that looks like coffee grounds; red spots on the skin; unusual bruising or bleeding including from the eye, gums, or nose -signs and symptoms of  a blood clot such as breathing problems; changes in vision; chest pain; severe, sudden headache; pain, swelling, warmth in the leg; trouble speaking; sudden numbness or weakness of the face, arm or leg -signs and symptoms of kidney injury like trouble passing urine or change in the amount of urine -signs and symptoms of liver injury like dark yellow or brown urine; general ill feeling or flu-like symptoms; light-colored stools; loss of appetite; nausea; right upper belly pain; unusually weak or tired; yellowing of the eyes or skin Side effects that usually do not require medical attention (report to your doctor or health care professional if they continue or are bothersome): -back pain -cough -diarrhea -headache -muscle cramps -vomiting This list may not describe all possible side effects. Call your doctor for medical advice about side effects. You may report side effects to FDA at 1-800-FDA-1088. Where should I keep my medicine? This drug is given in a hospital or clinic and will not be stored at home. NOTE: This sheet is a summary. It may not cover all possible information. If you have questions about this medicine, talk to your doctor, pharmacist, or health care provider.  2018 Elsevier/Gold Standard (2015-05-29 13:39:23)  

## 2017-04-21 ENCOUNTER — Other Ambulatory Visit: Payer: Self-pay | Admitting: Family

## 2017-04-21 ENCOUNTER — Ambulatory Visit (HOSPITAL_BASED_OUTPATIENT_CLINIC_OR_DEPARTMENT_OTHER): Payer: 59

## 2017-04-21 ENCOUNTER — Other Ambulatory Visit: Payer: Self-pay

## 2017-04-21 DIAGNOSIS — C9000 Multiple myeloma not having achieved remission: Secondary | ICD-10-CM | POA: Diagnosis not present

## 2017-04-21 MED ORDER — FUROSEMIDE 10 MG/ML IJ SOLN
INTRAMUSCULAR | Status: AC
  Filled 2017-04-21: qty 4

## 2017-04-21 MED ORDER — SODIUM CHLORIDE 0.9 % IV SOLN
250.0000 mL | Freq: Once | INTRAVENOUS | Status: AC
  Administered 2017-04-21: 250 mL via INTRAVENOUS

## 2017-04-21 MED ORDER — FUROSEMIDE 10 MG/ML IJ SOLN
20.0000 mg | Freq: Once | INTRAMUSCULAR | Status: AC
  Administered 2017-04-21: 20 mg via INTRAVENOUS

## 2017-04-21 MED ORDER — HEPARIN SOD (PORK) LOCK FLUSH 100 UNIT/ML IV SOLN
500.0000 [IU] | Freq: Every day | INTRAVENOUS | Status: AC | PRN
  Administered 2017-04-21: 500 [IU]
  Filled 2017-04-21: qty 5

## 2017-04-21 MED ORDER — SODIUM CHLORIDE 0.9% FLUSH
10.0000 mL | INTRAVENOUS | Status: AC | PRN
  Administered 2017-04-21: 10 mL
  Filled 2017-04-21: qty 10

## 2017-04-21 NOTE — Patient Instructions (Signed)

## 2017-04-21 NOTE — Progress Notes (Signed)
I unit of PRBC's hung at 0935 on 04/21/2017.

## 2017-04-21 NOTE — Progress Notes (Signed)
Patient complains of upper stomach pain which is chronic in nature. Patient rates the pain a 5 on the 0 to 10 pain scale. Patient states she has not taken her valium prescription with her today. Patient states she doesn't want anything for pain at this moment. However, nursing will continue to monitor. Patient verbalized understanding she is to make nursing aware if pain becomes worse or if she decides she wants something for pain.

## 2017-04-22 ENCOUNTER — Encounter: Payer: Self-pay | Admitting: Hematology & Oncology

## 2017-04-22 LAB — BPAM RBC
Blood Product Expiration Date: 201812131534
Blood Product Expiration Date: 201812131620
ISSUE DATE / TIME: 201812130755
ISSUE DATE / TIME: 201812130755
UNIT TYPE AND RH: 6200
Unit Type and Rh: 6200

## 2017-04-22 LAB — TYPE AND SCREEN
ABO/RH(D): A POS
ANTIBODY SCREEN: POSITIVE
DAT, IgG: POSITIVE
UNIT DIVISION: 0
Unit division: 0

## 2017-04-23 ENCOUNTER — Ambulatory Visit (HOSPITAL_COMMUNITY)
Admission: RE | Admit: 2017-04-23 | Discharge: 2017-04-23 | Disposition: A | Discharge status: Home / Self Care | Payer: 59 | Source: Ambulatory Visit | Attending: Family | Admitting: Family

## 2017-04-23 DIAGNOSIS — C9000 Multiple myeloma not having achieved remission: Secondary | ICD-10-CM | POA: Diagnosis present

## 2017-04-23 DIAGNOSIS — M40204 Unspecified kyphosis, thoracic region: Secondary | ICD-10-CM | POA: Diagnosis not present

## 2017-04-23 MED ORDER — GADOBENATE DIMEGLUMINE 529 MG/ML IV SOLN
20.0000 mL | Freq: Once | INTRAVENOUS | Status: AC | PRN
  Administered 2017-04-23: 17 mL via INTRAVENOUS

## 2017-04-25 ENCOUNTER — Ambulatory Visit (HOSPITAL_BASED_OUTPATIENT_CLINIC_OR_DEPARTMENT_OTHER): Payer: 59

## 2017-04-25 ENCOUNTER — Other Ambulatory Visit (HOSPITAL_BASED_OUTPATIENT_CLINIC_OR_DEPARTMENT_OTHER): Payer: 59

## 2017-04-25 ENCOUNTER — Encounter: Payer: Self-pay | Admitting: Hematology & Oncology

## 2017-04-25 ENCOUNTER — Other Ambulatory Visit: Payer: Self-pay

## 2017-04-25 ENCOUNTER — Ambulatory Visit (HOSPITAL_BASED_OUTPATIENT_CLINIC_OR_DEPARTMENT_OTHER): Payer: 59 | Admitting: Hematology & Oncology

## 2017-04-25 VITALS — BP 158/74 | HR 87 | Temp 97.9°F | Resp 18 | Wt 169.0 lb

## 2017-04-25 DIAGNOSIS — C9000 Multiple myeloma not having achieved remission: Secondary | ICD-10-CM

## 2017-04-25 DIAGNOSIS — C9002 Multiple myeloma in relapse: Secondary | ICD-10-CM

## 2017-04-25 DIAGNOSIS — Z5112 Encounter for antineoplastic immunotherapy: Secondary | ICD-10-CM

## 2017-04-25 DIAGNOSIS — Z5111 Encounter for antineoplastic chemotherapy: Secondary | ICD-10-CM | POA: Diagnosis not present

## 2017-04-25 DIAGNOSIS — R109 Unspecified abdominal pain: Secondary | ICD-10-CM

## 2017-04-25 LAB — CMP (CANCER CENTER ONLY)
ALBUMIN: 2.8 g/dL — AB (ref 3.3–5.5)
ALT(SGPT): 17 U/L (ref 10–47)
AST: 22 U/L (ref 11–38)
Alkaline Phosphatase: 46 U/L (ref 26–84)
BILIRUBIN TOTAL: 0.9 mg/dL (ref 0.20–1.60)
BUN, Bld: 17 mg/dL (ref 7–22)
CALCIUM: 8.3 mg/dL (ref 8.0–10.3)
CHLORIDE: 105 meq/L (ref 98–108)
CO2: 25 meq/L (ref 18–33)
Creat: 0.8 mg/dl (ref 0.6–1.2)
GLUCOSE: 83 mg/dL (ref 73–118)
POTASSIUM: 3.4 meq/L (ref 3.3–4.7)
Sodium: 141 mEq/L (ref 128–145)
Total Protein: 9.4 g/dL — ABNORMAL HIGH (ref 6.4–8.1)

## 2017-04-25 LAB — CBC WITH DIFFERENTIAL (CANCER CENTER ONLY)
BASO#: 0 10*3/uL (ref 0.0–0.2)
BASO%: 0.4 % (ref 0.0–2.0)
EOS ABS: 0.1 10*3/uL (ref 0.0–0.5)
EOS%: 3.2 % (ref 0.0–7.0)
HEMATOCRIT: 32 % — AB (ref 34.8–46.6)
HEMOGLOBIN: 10.7 g/dL — AB (ref 11.6–15.9)
LYMPH#: 0.8 10*3/uL — AB (ref 0.9–3.3)
LYMPH%: 28.2 % (ref 14.0–48.0)
MCH: 28.4 pg (ref 26.0–34.0)
MCHC: 33.4 g/dL (ref 32.0–36.0)
MCV: 85 fL (ref 81–101)
MONO#: 0.7 10*3/uL (ref 0.1–0.9)
MONO%: 23.6 % — ABNORMAL HIGH (ref 0.0–13.0)
NEUT%: 44.6 % (ref 39.6–80.0)
NEUTROS ABS: 1.3 10*3/uL — AB (ref 1.5–6.5)
Platelets: 54 10*3/uL — ABNORMAL LOW (ref 145–400)
RBC: 3.77 10*6/uL (ref 3.70–5.32)
RDW: 15.7 % (ref 11.1–15.7)
WBC: 2.8 10*3/uL — ABNORMAL LOW (ref 3.9–10.0)

## 2017-04-25 MED ORDER — SODIUM CHLORIDE 0.9 % IV SOLN: Freq: Once | INTRAVENOUS | Status: DC

## 2017-04-25 MED ORDER — SODIUM CHLORIDE 0.9 % IV SOLN
300.0000 mg/m2 | Freq: Once | INTRAVENOUS | Status: AC
  Administered 2017-04-25: 560 mg via INTRAVENOUS
  Filled 2017-04-25: qty 28

## 2017-04-25 MED ORDER — DEXAMETHASONE SODIUM PHOSPHATE 10 MG/ML IJ SOLN
10.0000 mg | Freq: Once | INTRAMUSCULAR | Status: AC
  Administered 2017-04-25: 10 mg via INTRAVENOUS

## 2017-04-25 MED ORDER — SODIUM CHLORIDE 0.9 % IV SOLN
Freq: Once | INTRAVENOUS | Status: AC
  Administered 2017-04-25: 10:00:00 via INTRAVENOUS

## 2017-04-25 MED ORDER — PALONOSETRON HCL INJECTION 0.25 MG/5ML
INTRAVENOUS | Status: AC
  Filled 2017-04-25: qty 5

## 2017-04-25 MED ORDER — DEXAMETHASONE SODIUM PHOSPHATE 10 MG/ML IJ SOLN
INTRAMUSCULAR | Status: AC
  Filled 2017-04-25: qty 1

## 2017-04-25 MED ORDER — SODIUM CHLORIDE 0.9% FLUSH
10.0000 mL | INTRAVENOUS | Status: DC | PRN
  Administered 2017-04-25: 10 mL
  Filled 2017-04-25: qty 10

## 2017-04-25 MED ORDER — CARFILZOMIB CHEMO INJECTION 60 MG
28.8000 mg/m2 | Freq: Once | INTRAVENOUS | Status: AC
  Administered 2017-04-25: 54 mg via INTRAVENOUS
  Filled 2017-04-25: qty 27

## 2017-04-25 MED ORDER — PALONOSETRON HCL INJECTION 0.25 MG/5ML
0.2500 mg | Freq: Once | INTRAVENOUS | Status: AC
  Administered 2017-04-25: 0.25 mg via INTRAVENOUS

## 2017-04-25 MED ORDER — HEPARIN SOD (PORK) LOCK FLUSH 100 UNIT/ML IV SOLN
500.0000 [IU] | Freq: Once | INTRAVENOUS | Status: AC | PRN
  Administered 2017-04-25: 500 [IU]
  Filled 2017-04-25: qty 5

## 2017-04-25 NOTE — Patient Instructions (Signed)
[facility] Discharge Instructions for Patients Receiving Chemotherapy  Today you received the following chemotherapy agents Cytoxan and Kyprolis.  To help prevent nausea and vomiting after your treatment, we encourage you to take your nausea medication as directed but NO ZOFRAN FOR 3 DAYS.    If you develop nausea and vomiting that is not controlled by your nausea medication, call the clinic.   BELOW ARE SYMPTOMS THAT SHOULD BE REPORTED IMMEDIATELY:  *FEVER GREATER THAN 100.5 F  *CHILLS WITH OR WITHOUT FEVER  NAUSEA AND VOMITING THAT IS NOT CONTROLLED WITH YOUR NAUSEA MEDICATION  *UNUSUAL SHORTNESS OF BREATH  *UNUSUAL BRUISING OR BLEEDING  TENDERNESS IN MOUTH AND THROAT WITH OR WITHOUT PRESENCE OF ULCERS  *URINARY PROBLEMS  *BOWEL PROBLEMS  UNUSUAL RASH Items with * indicate a potential emergency and should be followed up as soon as possible.  Feel free to call the clinic you have any questions or concerns. The clinic phone number is [phone].  Please show the [facility] at check-in to the Emergency Department and triage nurse.   Cyclophosphamide injection What is this medicine? CYCLOPHOSPHAMIDE (sye kloe FOSS fa mide) is a chemotherapy drug. It slows the growth of cancer cells. This medicine is used to treat many types of cancer like lymphoma, myeloma, leukemia, breast cancer, and ovarian cancer, to name a few. This medicine may be used for other purposes; ask your health care provider or pharmacist if you have questions. COMMON BRAND NAME(S): Cytoxan, Neosar What should I tell my health care provider before I take this medicine? They need to know if you have any of these conditions: -blood disorders -history of other chemotherapy -infection -kidney disease -liver disease -recent or ongoing radiation therapy -tumors in the bone marrow -an unusual or allergic reaction to cyclophosphamide, other chemotherapy, other medicines, foods, dyes, or  preservatives -pregnant or trying to get pregnant -breast-feeding How should I use this medicine? This drug is usually given as an injection into a vein or muscle or by infusion into a vein. It is administered in a hospital or clinic by a specially trained health care professional. Talk to your pediatrician regarding the use of this medicine in children. Special care may be needed. Overdosage: If you think you have taken too much of this medicine contact a poison control center or emergency room at once. NOTE: This medicine is only for you. Do not share this medicine with others. What if I miss a dose? It is important not to miss your dose. Call your doctor or health care professional if you are unable to keep an appointment. What may interact with this medicine? This medicine may interact with the following medications: -amiodarone -amphotericin B -azathioprine -certain antiviral medicines for HIV or AIDS such as protease inhibitors (e.g., indinavir, ritonavir) and zidovudine -certain blood pressure medications such as benazepril, captopril, enalapril, fosinopril, lisinopril, moexipril, monopril, perindopril, quinapril, ramipril, trandolapril -certain cancer medications such as anthracyclines (e.g., daunorubicin, doxorubicin), busulfan, cytarabine, paclitaxel, pentostatin, tamoxifen, trastuzumab -certain diuretics such as chlorothiazide, chlorthalidone, hydrochlorothiazide, indapamide, metolazone -certain medicines that treat or prevent blood clots like warfarin -certain muscle relaxants such as succinylcholine -cyclosporine -etanercept -indomethacin -medicines to increase blood counts like filgrastim, pegfilgrastim, sargramostim -medicines used as general anesthesia -metronidazole -natalizumab This list may not describe all possible interactions. Give your health care provider a list of all the medicines, herbs, non-prescription drugs, or dietary supplements you use. Also tell them if  you smoke, drink alcohol, or use illegal drugs. Some items may interact  with your medicine. What should I watch for while using this medicine? Visit your doctor for checks on your progress. This drug may make you feel generally unwell. This is not uncommon, as chemotherapy can affect healthy cells as well as cancer cells. Report any side effects. Continue your course of treatment even though you feel ill unless your doctor tells you to stop. Drink water or other fluids as directed. Urinate often, even at night. In some cases, you may be given additional medicines to help with side effects. Follow all directions for their use. Call your doctor or health care professional for advice if you get a fever, chills or sore throat, or other symptoms of a cold or flu. Do not treat yourself. This drug decreases your body's ability to fight infections. Try to avoid being around people who are sick. This medicine may increase your risk to bruise or bleed. Call your doctor or health care professional if you notice any unusual bleeding. Be careful brushing and flossing your teeth or using a toothpick because you may get an infection or bleed more easily. If you have any dental work done, tell your dentist you are receiving this medicine. You may get drowsy or dizzy. Do not drive, use machinery, or do anything that needs mental alertness until you know how this medicine affects you. Do not become pregnant while taking this medicine or for 1 year after stopping it. Women should inform their doctor if they wish to become pregnant or think they might be pregnant. Men should not father a child while taking this medicine and for 4 months after stopping it. There is a potential for serious side effects to an unborn child. Talk to your health care professional or pharmacist for more information. Do not breast-feed an infant while taking this medicine. This medicine may interfere with the ability to have a child. This medicine  has caused ovarian failure in some women. This medicine has caused reduced sperm counts in some men. You should talk with your doctor or health care professional if you are concerned about your fertility. If you are going to have surgery, tell your doctor or health care professional that you have taken this medicine. What side effects may I notice from receiving this medicine? Side effects that you should report to your doctor or health care professional as soon as possible: -allergic reactions like skin rash, itching or hives, swelling of the face, lips, or tongue -low blood counts - this medicine may decrease the number of white blood cells, red blood cells and platelets. You may be at increased risk for infections and bleeding. -signs of infection - fever or chills, cough, sore throat, pain or difficulty passing urine -signs of decreased platelets or bleeding - bruising, pinpoint red spots on the skin, black, tarry stools, blood in the urine -signs of decreased red blood cells - unusually weak or tired, fainting spells, lightheadedness -breathing problems -dark urine -dizziness -palpitations -swelling of the ankles, feet, hands -trouble passing urine or change in the amount of urine -weight gain -yellowing of the eyes or skin Side effects that usually do not require medical attention (report to your doctor or health care professional if they continue or are bothersome): -changes in nail or skin color -hair loss -missed menstrual periods -mouth sores -nausea, vomiting This list may not describe all possible side effects. Call your doctor for medical advice about side effects. You may report side effects to FDA at 1-800-FDA-1088. Where should I keep my medicine?  This drug is given in a hospital or clinic and will not be stored at home. NOTE: This sheet is a summary. It may not cover all possible information. If you have questions about this medicine, talk to your doctor, pharmacist, or  health care provider.  2018 Elsevier/Gold Standard (2012-03-10 16:22:58)

## 2017-04-25 NOTE — Progress Notes (Signed)
Hematology and Oncology Follow Up Visit  Barbara Cook [id number] [birth year] 61 y.o. 04/25/2017   Principle Diagnosis:  IgG kappa myeloma Hypercalcemia of malignancy  Current Therapy:   Daratumumab q weekly therapy - s/p cycle #3 - d/c'ed due to progression of myeloma Kyprolis/Cytoxan/Decadron - cycle #1 to start on 04/11/2017 Zometa every 3 months    Interim History:  Barbara Cook is here today with her husband.  She is having some issues with abdominal pain.  This appears to be musculoskeletal.  We did go ahead and get an MRI of her back.  This showed that she had some compression of T10.  There is no fracture.  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  There was some more involvement of L1.  I suspect that this abdominal pain might be a radiculopathy from T10.  I told her that we could try a kyphoplasty of T10.  We could also consider radiation therapy.  She just wants to stay with treatment.  She has little bit of swelling in her feet.  She is not taking her Demadex.  She will start taking this.  Her myeloma numbers are already improving.  I show them the myeloma numbers from a week or so ago.  We will have to see what her M spike shows.  As such, I believe that the Kyprolis/Cytoxan regimen is helping.  She has had no fever.  She has had no nausea or vomiting.  She has had no diarrhea.  She has had no rashes.  Since currently, her performance status is ECOG 2.  Medications:  Allergies as of 04/25/2017      Reactions   Codeine Palpitations      Medication List        Accurate as of 04/25/17  9:56 AM. Always use your most recent med list.          acetaminophen 500 MG tablet Commonly known as:  TYLENOL Take 500 mg by mouth every 6 (six) hours as needed for mild pain or headache.   acyclovir 400 MG tablet Commonly known as:  ZOVIRAX Take 1 tablet (400 mg total) by mouth daily.   benzonatate 100 MG capsule Commonly known as:  TESSALON PERLES Take 2 capsules  (200 mg total) 3 (three) times daily as needed by mouth for cough.   diazepam 2 MG tablet Commonly known as:  VALIUM Take 2 tablets (4 mg total) by mouth every 8 (eight) hours as needed for anxiety.   hyoscyamine 0.125 MG SL tablet Commonly known as:  LEVSIN SL Place 1 tablet (0.125 mg total) under the tongue every 4 (four) hours as needed.   magnesium oxide 400 (241.3 Mg) MG tablet Commonly known as:  MAG-OX TAKE 1 TABLET BY MOUTH TWICE A DAY   montelukast 10 MG tablet Commonly known as:  SINGULAIR Take 1 pill daily for 5 days starting today.   multivitamin tablet Take 1 tablet by mouth every evening.   ondansetron 8 MG tablet Commonly known as:  ZOFRAN TAKE 1 TABLET BY MOUTH TWICE A DAY FOR NAUSEA AND VOMITING 1 TABLET 1 HOUR PRIOR TO CHEMOTHERAPY   oseltamivir 75 MG capsule Commonly known as:  TAMIFLU   pantoprazole 40 MG tablet Commonly known as:  PROTONIX Take 1 tablet (40 mg total) by mouth daily.   polyethylene glycol powder powder Commonly known as:  GLYCOLAX/MIRALAX 1 capful daily as needed   prochlorperazine 10 MG tablet Commonly known as:  COMPAZINE Take 1 tablet (10 mg total)  by mouth every 6 (six) hours as needed for nausea or vomiting.   torsemide 20 MG tablet Commonly known as:  DEMADEX Take 1 tablet (20 mg total) by mouth daily as needed.   traMADol 50 MG tablet Commonly known as:  ULTRAM Take 1 tablet (50 mg total) every 6 (six) hours as needed by mouth.   vitamin B-6 250 MG tablet Take 1 tablet (250 mg total) by mouth daily.   Vitamin D (Ergocalciferol) 50000 units Caps capsule Commonly known as:  DRISDOL TAKE 1 CAPSULE (50,000 UNITS TOTAL) BY MOUTH ONCE A WEEK.       Allergies:  Allergies  Allergen Reactions  . Codeine Palpitations    Past Medical History, Surgical history, Social history, and Family History were reviewed and updated.  Review of Systems: As stated in the interim history.   Physical Exam:  weight is 169 lb (76.7  kg). Her oral temperature is 97.9 F (36.6 C). Her blood pressure is 158/74 (abnormal) and her pulse is 87. Her respiration is 18 and oxygen saturation is 97%.   Wt Readings from Last 3 Encounters:  04/25/17 169 lb (76.7 kg)  04/19/17 174 lb 1.6 oz (79 kg)  04/11/17 167 lb (75.8 kg)    Physical Exam  Constitutional: She is oriented to person, place, and time.  HENT:  Head: Normocephalic and atraumatic.  Mouth/Throat: Oropharynx is clear and moist.  Eyes: EOM are normal. Pupils are equal, round, and reactive to light.  Neck: Normal range of motion.  Cardiovascular: Normal rate, regular rhythm and normal heart sounds.  Pulmonary/Chest: Effort normal and breath sounds normal.  Abdominal: Soft. Bowel sounds are normal.  Musculoskeletal: Normal range of motion. She exhibits no edema, tenderness or deformity.  Lymphadenopathy:    She has no cervical adenopathy.  Neurological: She is alert and oriented to person, place, and time.  Skin: Skin is warm and dry. No rash noted. No erythema.  Psychiatric: She has a normal mood and affect. Her behavior is normal. Judgment and thought content normal.  Vitals reviewed.         Lab Results  Component Value Date   WBC 2.8 (L) 04/25/2017   HGB 10.7 (L) 04/25/2017   HCT 32.0 (L) 04/25/2017   MCV 85 04/25/2017   PLT 54 (L) 04/25/2017   Lab Results  Component Value Date   FERRITIN 183 03/19/2017   IRON 51 03/19/2017   TIBC 204 (L) 03/19/2017   UIBC 153 03/19/2017   IRONPCTSAT 25 03/19/2017   Lab Results  Component Value Date   RETICCTPCT 0.6 03/19/2017   RBC 3.77 04/25/2017   RETICCTABS 35.9 12/25/2014   Lab Results  Component Value Date   KPAFRELGTCHN 7.11 (H) 05/07/2015   LAMBDASER 1.65 05/07/2015   KAPLAMBRATIO 24.68 (H) 04/19/2017   Lab Results  Component Value Date   IGGSERUM 6,231 (H) 04/19/2017   IGA 116 05/07/2015   IGMSERUM 12 (L) 04/19/2017   Lab Results  Component Value Date   TOTALPROTELP 7.8 05/07/2015    ALBUMINELP 4.2 05/07/2015   A1GS 0.3 05/07/2015   A2GS 0.8 05/07/2015   BETS 0.4 05/07/2015   BETA2SER 0.3 05/07/2015   GAMS 1.9 (H) 05/07/2015   MSPIKE 5.5 (H) 04/11/2017   SPEI * 05/07/2015     Chemistry      Component Value Date/Time   NA 141 04/25/2017 0830   NA 139 02/20/2016 1207   K 3.4 04/25/2017 0830   K 3.8 02/20/2016 1207   CL 105 04/25/2017  0830   CO2 25 04/25/2017 0830   CO2 29 02/20/2016 1207   BUN 17 04/25/2017 0830   BUN 22.3 02/20/2016 1207   CREATININE 0.8 04/25/2017 0830   CREATININE 1.3 (H) 02/20/2016 1207      Component Value Date/Time   CALCIUM 8.3 04/25/2017 0830   CALCIUM 9.7 02/20/2016 1207   ALKPHOS 46 04/25/2017 0830   ALKPHOS 62 02/20/2016 1207   AST 22 04/25/2017 0830   AST 27 02/20/2016 1207   ALT 17 04/25/2017 0830   ALT 19 02/20/2016 1207   BILITOT 0.90 04/25/2017 0830   BILITOT 0.44 02/20/2016 1207      Impression and Plan: Ms. Seybold is a very pleasant 61 yo African American female with IgG Kappa myeloma.   I really think that this abdominal pain is radicular from the back.  It comes around both sides of the abdomen.  It appears to be concentrated to a dermatome.  We will continue her on therapy.  I think that her platelet count, although low, is still okay for therapy.  We will have to follow her closely.  I will plan to see her back in 2 or 3 weeks.  I spent about 30 minutes with she and her husband today.  Sheralyn Boatman, MD 12/17/20189:56 AM

## 2017-04-25 NOTE — Progress Notes (Signed)
Ok to treat with labs today (PLT and ANC) per MD Marin Olp

## 2017-04-26 ENCOUNTER — Ambulatory Visit (HOSPITAL_BASED_OUTPATIENT_CLINIC_OR_DEPARTMENT_OTHER): Payer: 59

## 2017-04-26 DIAGNOSIS — Z5112 Encounter for antineoplastic immunotherapy: Secondary | ICD-10-CM | POA: Diagnosis not present

## 2017-04-26 DIAGNOSIS — Z5111 Encounter for antineoplastic chemotherapy: Secondary | ICD-10-CM

## 2017-04-26 DIAGNOSIS — C9002 Multiple myeloma in relapse: Secondary | ICD-10-CM

## 2017-04-26 DIAGNOSIS — C9000 Multiple myeloma not having achieved remission: Secondary | ICD-10-CM | POA: Diagnosis not present

## 2017-04-26 LAB — IGG, IGA, IGM
IGA/IMMUNOGLOBULIN A, SERUM: 13 mg/dL — AB (ref 87–352)
IgG, Qn, Serum: 4897 mg/dL — ABNORMAL HIGH (ref 700–1600)
IgM, Qn, Serum: 14 mg/dL — ABNORMAL LOW (ref 26–217)

## 2017-04-26 LAB — PROTEIN ELECTROPHORESIS, SERUM, WITH REFLEX
A/G Ratio: 0.5 — ABNORMAL LOW (ref 0.7–1.7)
ALBUMIN: 3.1 g/dL (ref 2.9–4.4)
ALPHA 1: 0.3 g/dL (ref 0.0–0.4)
ALPHA 2: 0.7 g/dL (ref 0.4–1.0)
BETA: 1 g/dL (ref 0.7–1.3)
Gamma Globulin: 4.9 g/dL — ABNORMAL HIGH (ref 0.4–1.8)
Globulin, Total: 6.8 g/dL — ABNORMAL HIGH (ref 2.2–3.9)
Interpretation(See Below): 0
M-Spike, %: 4.7 g/dL — ABNORMAL HIGH
Total Protein: 9.9 g/dL — ABNORMAL HIGH (ref 6.0–8.5)

## 2017-04-26 LAB — KAPPA/LAMBDA LIGHT CHAINS
Ig Kappa Free Light Chain: 20.7 mg/L — ABNORMAL HIGH (ref 3.3–19.4)
Ig Lambda Free Light Chain: 6.2 mg/L (ref 5.7–26.3)
KAPPA/LAMBDA FLC RATIO: 3.34 — AB (ref 0.26–1.65)

## 2017-04-26 MED ORDER — HEPARIN SOD (PORK) LOCK FLUSH 100 UNIT/ML IV SOLN
500.0000 [IU] | Freq: Once | INTRAVENOUS | Status: AC | PRN
  Administered 2017-04-26: 500 [IU]
  Filled 2017-04-26: qty 5

## 2017-04-26 MED ORDER — SODIUM CHLORIDE 0.9 % IV SOLN
Freq: Once | INTRAVENOUS | Status: AC
  Administered 2017-04-26: 09:00:00 via INTRAVENOUS

## 2017-04-26 MED ORDER — CARFILZOMIB CHEMO INJECTION 60 MG
28.8000 mg/m2 | Freq: Once | INTRAVENOUS | Status: AC
  Administered 2017-04-26: 54 mg via INTRAVENOUS
  Filled 2017-04-26: qty 27

## 2017-04-26 MED ORDER — SODIUM CHLORIDE 0.9% FLUSH
10.0000 mL | INTRAVENOUS | Status: DC | PRN
  Administered 2017-04-26: 10 mL
  Filled 2017-04-26: qty 10

## 2017-04-26 MED ORDER — DEXAMETHASONE SODIUM PHOSPHATE 10 MG/ML IJ SOLN
10.0000 mg | Freq: Once | INTRAMUSCULAR | Status: AC
  Administered 2017-04-26: 10 mg via INTRAVENOUS

## 2017-04-26 MED ORDER — DEXAMETHASONE SODIUM PHOSPHATE 10 MG/ML IJ SOLN
INTRAMUSCULAR | Status: AC
  Filled 2017-04-26: qty 1

## 2017-04-26 MED ORDER — GADOBENATE DIMEGLUMINE 529 MG/ML IV SOLN
15.0000 mL | Freq: Once | INTRAVENOUS | Status: AC | PRN
  Administered 2017-04-23: 15 mL via INTRAVENOUS

## 2017-04-26 NOTE — Patient Instructions (Signed)
[facility] Discharge Instructions for Patients Receiving Chemotherapy  Today you received the following chemotherapy agents:  Kyprolis  To help prevent nausea and vomiting after your treatment, we encourage you to take your nausea medication as ordered per MD.    If you develop nausea and vomiting that is not controlled by your nausea medication, call the clinic.   BELOW ARE SYMPTOMS THAT SHOULD BE REPORTED IMMEDIATELY:  *FEVER GREATER THAN 100.5 F  *CHILLS WITH OR WITHOUT FEVER  NAUSEA AND VOMITING THAT IS NOT CONTROLLED WITH YOUR NAUSEA MEDICATION  *UNUSUAL SHORTNESS OF BREATH  *UNUSUAL BRUISING OR BLEEDING  TENDERNESS IN MOUTH AND THROAT WITH OR WITHOUT PRESENCE OF ULCERS  *URINARY PROBLEMS  *BOWEL PROBLEMS  UNUSUAL RASH Items with * indicate a potential emergency and should be followed up as soon as possible.  Feel free to call the clinic should you have any questions or concerns. The clinic phone number is [phone].  Please show the CHEMO ALERT CARD at check-in to the Emergency Department and triage nurse.   

## 2017-04-29 LAB — PROTEIN ELECTROPHORESIS, SERUM, WITH REFLEX
A/G RATIO SPE: 0.6 — AB (ref 0.7–1.7)
Albumin: 3.3 g/dL (ref 2.9–4.4)
Alpha 1: 0.2 g/dL (ref 0.0–0.4)
Alpha 2: 0.7 g/dL (ref 0.4–1.0)
Beta: 0.8 g/dL (ref 0.7–1.3)
GLOBULIN, TOTAL: 5.2 g/dL — AB (ref 2.2–3.9)
Gamma Globulin: 3.5 g/dL — ABNORMAL HIGH (ref 0.4–1.8)
INTERPRETATION(SEE BELOW): 0
M-SPIKE, %: 3.3 g/dL — AB
TOTAL PROTEIN: 8.5 g/dL (ref 6.0–8.5)

## 2017-05-02 ENCOUNTER — Other Ambulatory Visit: Payer: 59

## 2017-05-02 ENCOUNTER — Ambulatory Visit: Payer: 59

## 2017-05-09 ENCOUNTER — Ambulatory Visit (HOSPITAL_BASED_OUTPATIENT_CLINIC_OR_DEPARTMENT_OTHER): Payer: 59

## 2017-05-09 ENCOUNTER — Encounter: Payer: Self-pay | Admitting: Hematology & Oncology

## 2017-05-09 ENCOUNTER — Ambulatory Visit: Payer: 59

## 2017-05-09 ENCOUNTER — Ambulatory Visit (HOSPITAL_BASED_OUTPATIENT_CLINIC_OR_DEPARTMENT_OTHER): Payer: 59 | Admitting: Hematology & Oncology

## 2017-05-09 ENCOUNTER — Other Ambulatory Visit: Payer: Self-pay

## 2017-05-09 ENCOUNTER — Other Ambulatory Visit: Payer: 59

## 2017-05-09 DIAGNOSIS — D696 Thrombocytopenia, unspecified: Secondary | ICD-10-CM | POA: Diagnosis not present

## 2017-05-09 DIAGNOSIS — C9000 Multiple myeloma not having achieved remission: Secondary | ICD-10-CM

## 2017-05-09 DIAGNOSIS — D649 Anemia, unspecified: Secondary | ICD-10-CM

## 2017-05-09 DIAGNOSIS — D630 Anemia in neoplastic disease: Secondary | ICD-10-CM | POA: Diagnosis not present

## 2017-05-09 DIAGNOSIS — C9002 Multiple myeloma in relapse: Secondary | ICD-10-CM

## 2017-05-09 DIAGNOSIS — Z5112 Encounter for antineoplastic immunotherapy: Secondary | ICD-10-CM

## 2017-05-09 DIAGNOSIS — Z5111 Encounter for antineoplastic chemotherapy: Secondary | ICD-10-CM | POA: Diagnosis not present

## 2017-05-09 LAB — CBC WITH DIFFERENTIAL (CANCER CENTER ONLY)
BASO#: 0 10*3/uL (ref 0.0–0.2)
BASO%: 0 % (ref 0.0–2.0)
EOS%: 4.5 % (ref 0.0–7.0)
Eosinophils Absolute: 0.1 10*3/uL (ref 0.0–0.5)
HCT: 29.9 % — ABNORMAL LOW (ref 34.8–46.6)
HGB: 10 g/dL — ABNORMAL LOW (ref 11.6–15.9)
LYMPH#: 0.7 10*3/uL — AB (ref 0.9–3.3)
LYMPH%: 32.2 % (ref 14.0–48.0)
MCH: 28.9 pg (ref 26.0–34.0)
MCHC: 33.4 g/dL (ref 32.0–36.0)
MCV: 86 fL (ref 81–101)
MONO#: 0.4 10*3/uL (ref 0.1–0.9)
MONO%: 17.8 % — ABNORMAL HIGH (ref 0.0–13.0)
NEUT#: 0.9 10*3/uL — ABNORMAL LOW (ref 1.5–6.5)
NEUT%: 45.5 % (ref 39.6–80.0)
PLATELETS: 116 10*3/uL — AB (ref 145–400)
RBC: 3.46 10*6/uL — ABNORMAL LOW (ref 3.70–5.32)
RDW: 16.8 % — AB (ref 11.1–15.7)
WBC: 2 10*3/uL — ABNORMAL LOW (ref 3.9–10.0)

## 2017-05-09 LAB — CMP (CANCER CENTER ONLY)
ALK PHOS: 123 U/L — AB (ref 26–84)
ALT: 26 U/L (ref 10–47)
AST: 27 U/L (ref 11–38)
Albumin: 3.2 g/dL — ABNORMAL LOW (ref 3.3–5.5)
BILIRUBIN TOTAL: 0.9 mg/dL (ref 0.20–1.60)
BUN: 13 mg/dL (ref 7–22)
CO2: 26 mEq/L (ref 18–33)
Calcium: 8.3 mg/dL (ref 8.0–10.3)
Chloride: 108 mEq/L (ref 98–108)
Creat: 0.9 mg/dl (ref 0.6–1.2)
GLUCOSE: 89 mg/dL (ref 73–118)
Potassium: 4 mEq/L (ref 3.3–4.7)
Sodium: 143 mEq/L (ref 128–145)
Total Protein: 8 g/dL (ref 6.4–8.1)

## 2017-05-09 MED ORDER — SODIUM CHLORIDE 0.9 % IV SOLN
300.0000 mg/m2 | Freq: Once | INTRAVENOUS | Status: AC
  Administered 2017-05-09: 560 mg via INTRAVENOUS
  Filled 2017-05-09: qty 28

## 2017-05-09 MED ORDER — SODIUM CHLORIDE 0.9 % IV SOLN: Freq: Once | INTRAVENOUS | Status: DC

## 2017-05-09 MED ORDER — CARFILZOMIB CHEMO INJECTION 60 MG
54.0000 mg | Freq: Once | INTRAVENOUS | Status: AC
  Administered 2017-05-09: 54 mg via INTRAVENOUS
  Filled 2017-05-09: qty 27

## 2017-05-09 MED ORDER — DEXTROSE 5 % IV SOLN: 36.0000 mg/m2 | Freq: Once | INTRAVENOUS | Status: DC

## 2017-05-09 MED ORDER — PALONOSETRON HCL INJECTION 0.25 MG/5ML
INTRAVENOUS | Status: AC
  Filled 2017-05-09: qty 5

## 2017-05-09 MED ORDER — SODIUM CHLORIDE 0.9 % IV SOLN
Freq: Once | INTRAVENOUS | Status: AC
  Administered 2017-05-09: 10:00:00 via INTRAVENOUS

## 2017-05-09 MED ORDER — PALONOSETRON HCL INJECTION 0.25 MG/5ML
0.2500 mg | Freq: Once | INTRAVENOUS | Status: AC
  Administered 2017-05-09: 0.25 mg via INTRAVENOUS

## 2017-05-09 MED ORDER — SODIUM CHLORIDE 0.9% FLUSH
10.0000 mL | INTRAVENOUS | Status: DC | PRN
  Administered 2017-05-09: 10 mL
  Filled 2017-05-09: qty 10

## 2017-05-09 MED ORDER — DEXAMETHASONE SODIUM PHOSPHATE 10 MG/ML IJ SOLN
10.0000 mg | Freq: Once | INTRAMUSCULAR | Status: DC
Start: 2017-05-09 — End: 2017-05-09

## 2017-05-09 MED ORDER — HEPARIN SOD (PORK) LOCK FLUSH 100 UNIT/ML IV SOLN
500.0000 [IU] | Freq: Once | INTRAVENOUS | Status: AC | PRN
Start: 2017-05-09 — End: 2017-05-09
  Administered 2017-05-09: 500 [IU]
  Filled 2017-05-09: qty 5

## 2017-05-09 NOTE — Progress Notes (Signed)
Hematology and Oncology Follow Up Visit  Barbara Cook [id number] 1955-10-08 61 y.o. 05/09/2017   Principle Diagnosis:  IgG kappa myeloma Hypercalcemia of malignancy  Current Therapy:   Daratumumab q weekly therapy - s/p cycle #3 - d/c'ed due to progression of myeloma Kyprolis/Cytoxan/Decadron - s/p cycle #1 - started on 04/11/2017 Zometa every 3 months    Interim History:  Barbara Cook is here today with her husband.  She is doing better.  Her abdomen does not hurt as much.  She is complaining of some leg swelling.  This might be from her having anemia.  This also might be from the steroids that she is getting.  I will decrease her steroid down to 20 mg weekly.  She is already responded nicely.  Her M spike is now down to 3.5 g/dL.  Her IgG level is now down to 4900 mg/dL.  Her kappa light chain is now down to 2.1 mg/dL.  We did do an MRI of her thoracic spine.  There is no compression fracture.  She does have some myelomatous involvement.  There is extensive myelomatous involvement of the lower thoracic spine and the L1 vertebral body.  There is mild compression deformity of T10.  She has had no fever.  She has had no nausea or vomiting.  She has had no diarrhea.  Overall, her performance status is ECOG 2.  Medications:  Allergies as of 05/09/2017      Reactions   Codeine Palpitations      Medication List        Accurate as of 05/09/17  9:00 AM. Always use your most recent med list.          acetaminophen 500 MG tablet Commonly known as:  TYLENOL Take 500 mg by mouth every 6 (six) hours as needed for mild pain or headache.   acyclovir 400 MG tablet Commonly known as:  ZOVIRAX Take 1 tablet (400 mg total) by mouth daily.   benzonatate 100 MG capsule Commonly known as:  TESSALON PERLES Take 2 capsules (200 mg total) 3 (three) times daily as needed by mouth for cough.   diazepam 2 MG tablet Commonly known as:  VALIUM Take 2 tablets (4 mg total) by mouth every 8  (eight) hours as needed for anxiety.   hyoscyamine 0.125 MG SL tablet Commonly known as:  LEVSIN SL Place 1 tablet (0.125 mg total) under the tongue every 4 (four) hours as needed.   magnesium oxide 400 (241.3 Mg) MG tablet Commonly known as:  MAG-OX TAKE 1 TABLET BY MOUTH TWICE A DAY   montelukast 10 MG tablet Commonly known as:  SINGULAIR Take 1 pill daily for 5 days starting today.   multivitamin tablet Take 1 tablet by mouth every evening.   ondansetron 8 MG tablet Commonly known as:  ZOFRAN TAKE 1 TABLET BY MOUTH TWICE A DAY FOR NAUSEA AND VOMITING 1 TABLET 1 HOUR PRIOR TO CHEMOTHERAPY   oseltamivir 75 MG capsule Commonly known as:  TAMIFLU   pantoprazole 40 MG tablet Commonly known as:  PROTONIX Take 1 tablet (40 mg total) by mouth daily.   polyethylene glycol powder powder Commonly known as:  GLYCOLAX/MIRALAX 1 capful daily as needed   prochlorperazine 10 MG tablet Commonly known as:  COMPAZINE Take 1 tablet (10 mg total) by mouth every 6 (six) hours as needed for nausea or vomiting.   torsemide 20 MG tablet Commonly known as:  DEMADEX Take 1 tablet (20 mg total) by mouth daily as  needed.   traMADol 50 MG tablet Commonly known as:  ULTRAM Take 1 tablet (50 mg total) every 6 (six) hours as needed by mouth.   vitamin B-6 250 MG tablet Take 1 tablet (250 mg total) by mouth daily.   Vitamin D (Ergocalciferol) 50000 units Caps capsule Commonly known as:  DRISDOL TAKE 1 CAPSULE (50,000 UNITS TOTAL) BY MOUTH ONCE A WEEK.       Allergies:  Allergies  Allergen Reactions  . Codeine Palpitations    Past Medical History, Surgical history, Social history, and Family History were reviewed and updated.  Review of Systems: As stated in the interim history.   Physical Exam:  vitals were not taken for this visit.   Wt Readings from Last 3 Encounters:  04/25/17 169 lb (76.7 kg)  04/19/17 174 lb 1.6 oz (79 kg)  04/11/17 167 lb (75.8 kg)    Physical Exam    Constitutional: She is oriented to person, place, and time.  HENT:  Head: Normocephalic and atraumatic.  Mouth/Throat: Oropharynx is clear and moist.  Eyes: EOM are normal. Pupils are equal, round, and reactive to light.  Neck: Normal range of motion.  Cardiovascular: Normal rate, regular rhythm and normal heart sounds.  Pulmonary/Chest: Effort normal and breath sounds normal.  Abdominal: Soft. Bowel sounds are normal.  Musculoskeletal: Normal range of motion. She exhibits no edema, tenderness or deformity.  Lymphadenopathy:    She has no cervical adenopathy.  Neurological: She is alert and oriented to person, place, and time.  Skin: Skin is warm and dry. No rash noted. No erythema.  Psychiatric: She has a normal mood and affect. Her behavior is normal. Judgment and thought content normal.  Vitals reviewed.         Lab Results  Component Value Date   WBC 2.0 (L) 05/09/2017   HGB 10.0 (L) 05/09/2017   HCT 29.9 (L) 05/09/2017   MCV 86 05/09/2017   PLT 116 (L) 05/09/2017   Lab Results  Component Value Date   FERRITIN 183 03/19/2017   IRON 51 03/19/2017   TIBC 204 (L) 03/19/2017   UIBC 153 03/19/2017   IRONPCTSAT 25 03/19/2017   Lab Results  Component Value Date   RETICCTPCT 0.6 03/19/2017   RBC 3.46 (L) 05/09/2017   RETICCTABS 35.9 12/25/2014   Lab Results  Component Value Date   KPAFRELGTCHN 7.11 (H) 05/07/2015   LAMBDASER 1.65 05/07/2015   KAPLAMBRATIO 3.34 (H) 04/25/2017   Lab Results  Component Value Date   IGGSERUM 4,897 (H) 04/25/2017   IGA 116 05/07/2015   IGMSERUM 14 (L) 04/25/2017   Lab Results  Component Value Date   TOTALPROTELP 7.8 05/07/2015   ALBUMINELP 4.2 05/07/2015   A1GS 0.3 05/07/2015   A2GS 0.8 05/07/2015   BETS 0.4 05/07/2015   BETA2SER 0.3 05/07/2015   GAMS 1.9 (H) 05/07/2015   MSPIKE 3.3 (H) 04/25/2017   SPEI * 05/07/2015     Chemistry      Component Value Date/Time   NA 141 04/25/2017 0830   NA 139 02/20/2016 1207   K 3.4  04/25/2017 0830   K 3.8 02/20/2016 1207   CL 105 04/25/2017 0830   CO2 25 04/25/2017 0830   CO2 29 02/20/2016 1207   BUN 17 04/25/2017 0830   BUN 22.3 02/20/2016 1207   CREATININE 0.8 04/25/2017 0830   CREATININE 1.3 (H) 02/20/2016 1207      Component Value Date/Time   CALCIUM 8.3 04/25/2017 0830   CALCIUM 9.7 02/20/2016 1207  ALKPHOS 46 04/25/2017 0830   ALKPHOS 62 02/20/2016 1207   AST 22 04/25/2017 0830   AST 27 02/20/2016 1207   ALT 17 04/25/2017 0830   ALT 19 02/20/2016 1207   BILITOT 0.90 04/25/2017 0830   BILITOT 0.44 02/20/2016 1207      Impression and Plan: Ms. Tiedt is a very pleasant 61 yo African American female with IgG Kappa myeloma.   Again, I am happy that she is responding.  I do believe that her swelling will get better.  Hopefully, we will see her anemia improve.  We will plan to have her get back in another week for follow-up.    [name], MD 12/31/20189:00 AM

## 2017-05-09 NOTE — Progress Notes (Signed)
Ok to treat with ANC today per MD Ennever , will not increase Kyprolis dose today as it was originally scheduled to. IT will remain the same today

## 2017-05-09 NOTE — Patient Instructions (Signed)
[facility] Discharge Instructions for Patients Receiving Chemotherapy  Today you received the following chemotherapy agents Cytoxan and Kyprolis.  To help prevent nausea and vomiting after your treatment, we encourage you to take your nausea medication as directed but NO ZOFRAN FOR 3 DAYS. TAKE COMPAZINE INSTEAD.   If you develop nausea and vomiting that is not controlled by your nausea medication, call the clinic.   BELOW ARE SYMPTOMS THAT SHOULD BE REPORTED IMMEDIATELY:  *FEVER GREATER THAN 100.5 F  *CHILLS WITH OR WITHOUT FEVER  NAUSEA AND VOMITING THAT IS NOT CONTROLLED WITH YOUR NAUSEA MEDICATION  *UNUSUAL SHORTNESS OF BREATH  *UNUSUAL BRUISING OR BLEEDING  TENDERNESS IN MOUTH AND THROAT WITH OR WITHOUT PRESENCE OF ULCERS  *URINARY PROBLEMS  *BOWEL PROBLEMS  UNUSUAL RASH Items with * indicate a potential emergency and should be followed up as soon as possible.  Feel free to call the clinic you have any questions or concerns. The clinic phone number is [phone].  Please show the [place] at check-in to the Emergency Department and triage nurse.   Cyclophosphamide injection What is this medicine? CYCLOPHOSPHAMIDE (sye kloe FOSS fa mide) is a chemotherapy drug. It slows the growth of cancer cells. This medicine is used to treat many types of cancer like lymphoma, myeloma, leukemia, breast cancer, and ovarian cancer, to name a few. This medicine may be used for other purposes; ask your health care provider or pharmacist if you have questions. COMMON BRAND NAME(S): Cytoxan, Neosar What should I tell my health care provider before I take this medicine? They need to know if you have any of these conditions: -blood disorders -history of other chemotherapy -infection -kidney disease -liver disease -recent or ongoing radiation therapy -tumors in the bone marrow -an unusual or allergic reaction to cyclophosphamide, other chemotherapy, other  medicines, foods, dyes, or preservatives -pregnant or trying to get pregnant -breast-feeding How should I use this medicine? This drug is usually given as an injection into a vein or muscle or by infusion into a vein. It is administered in a hospital or clinic by a specially trained health care professional. Talk to your pediatrician regarding the use of this medicine in children. Special care may be needed. Overdosage: If you think you have taken too much of this medicine contact a poison control center or emergency room at once. NOTE: This medicine is only for you. Do not share this medicine with others. What if I miss a dose? It is important not to miss your dose. Call your doctor or health care professional if you are unable to keep an appointment. What may interact with this medicine? This medicine may interact with the following medications: -amiodarone -amphotericin B -azathioprine -certain antiviral medicines for HIV or AIDS such as protease inhibitors (e.g., indinavir, ritonavir) and zidovudine -certain blood pressure medications such as benazepril, captopril, enalapril, fosinopril, lisinopril, moexipril, monopril, perindopril, quinapril, ramipril, trandolapril -certain cancer medications such as anthracyclines (e.g., daunorubicin, doxorubicin), busulfan, cytarabine, paclitaxel, pentostatin, tamoxifen, trastuzumab -certain diuretics such as chlorothiazide, chlorthalidone, hydrochlorothiazide, indapamide, metolazone -certain medicines that treat or prevent blood clots like warfarin -certain muscle relaxants such as succinylcholine -cyclosporine -etanercept -indomethacin -medicines to increase blood counts like filgrastim, pegfilgrastim, sargramostim -medicines used as general anesthesia -metronidazole -natalizumab This list may not describe all possible interactions. Give your health care provider a list of all the medicines, herbs, non-prescription drugs, or dietary supplements  you use. Also tell them if you smoke, drink alcohol, or use illegal drugs. Some items  may interact with your medicine. What should I watch for while using this medicine? Visit your doctor for checks on your progress. This drug may make you feel generally unwell. This is not uncommon, as chemotherapy can affect healthy cells as well as cancer cells. Report any side effects. Continue your course of treatment even though you feel ill unless your doctor tells you to stop. Drink water or other fluids as directed. Urinate often, even at night. In some cases, you may be given additional medicines to help with side effects. Follow all directions for their use. Call your doctor or health care professional for advice if you get a fever, chills or sore throat, or other symptoms of a cold or flu. Do not treat yourself. This drug decreases your body's ability to fight infections. Try to avoid being around people who are sick. This medicine may increase your risk to bruise or bleed. Call your doctor or health care professional if you notice any unusual bleeding. Be careful brushing and flossing your teeth or using a toothpick because you may get an infection or bleed more easily. If you have any dental work done, tell your dentist you are receiving this medicine. You may get drowsy or dizzy. Do not drive, use machinery, or do anything that needs mental alertness until you know how this medicine affects you. Do not become pregnant while taking this medicine or for 1 year after stopping it. Women should inform their doctor if they wish to become pregnant or think they might be pregnant. Men should not father a child while taking this medicine and for 4 months after stopping it. There is a potential for serious side effects to an unborn child. Talk to your health care professional or pharmacist for more information. Do not breast-feed an infant while taking this medicine. This medicine may interfere with the ability to  have a child. This medicine has caused ovarian failure in some women. This medicine has caused reduced sperm counts in some men. You should talk with your doctor or health care professional if you are concerned about your fertility. If you are going to have surgery, tell your doctor or health care professional that you have taken this medicine. What side effects may I notice from receiving this medicine? Side effects that you should report to your doctor or health care professional as soon as possible: -allergic reactions like skin rash, itching or hives, swelling of the face, lips, or tongue -low blood counts - this medicine may decrease the number of white blood cells, red blood cells and platelets. You may be at increased risk for infections and bleeding. -signs of infection - fever or chills, cough, sore throat, pain or difficulty passing urine -signs of decreased platelets or bleeding - bruising, pinpoint red spots on the skin, black, tarry stools, blood in the urine -signs of decreased red blood cells - unusually weak or tired, fainting spells, lightheadedness -breathing problems -dark urine -dizziness -palpitations -swelling of the ankles, feet, hands -trouble passing urine or change in the amount of urine -weight gain -yellowing of the eyes or skin Side effects that usually do not require medical attention (report to your doctor or health care professional if they continue or are bothersome): -changes in nail or skin color -hair loss -missed menstrual periods -mouth sores -nausea, vomiting This list may not describe all possible side effects. Call your doctor for medical advice about side effects. You may report side effects to FDA at 1-800-FDA-1088. Where should I keep  my medicine? This drug is given in a hospital or clinic and will not be stored at home. NOTE: This sheet is a summary. It may not cover all possible information. If you have questions about this medicine, talk to  your doctor, pharmacist, or health care provider.  2018 [employer]vier/Gold Standard (2012-03-10 16:22:58)

## 2017-05-10 LAB — IGG, IGA, IGM
IGM (IMMUNOGLOBIN M), SRM: 16 mg/dL — AB (ref 26–217)
IgA, Qn, Serum: 18 mg/dL — ABNORMAL LOW (ref 87–352)
IgG, Qn, Serum: 3182 mg/dL — ABNORMAL HIGH (ref 700–1600)

## 2017-05-11 ENCOUNTER — Ambulatory Visit (HOSPITAL_BASED_OUTPATIENT_CLINIC_OR_DEPARTMENT_OTHER): Payer: 59

## 2017-05-11 DIAGNOSIS — C9000 Multiple myeloma not having achieved remission: Secondary | ICD-10-CM | POA: Diagnosis not present

## 2017-05-11 DIAGNOSIS — Z5112 Encounter for antineoplastic immunotherapy: Secondary | ICD-10-CM

## 2017-05-11 DIAGNOSIS — C9002 Multiple myeloma in relapse: Secondary | ICD-10-CM

## 2017-05-11 LAB — KAPPA/LAMBDA LIGHT CHAINS
IG LAMBDA FREE LIGHT CHAIN: 8.2 mg/L (ref 5.7–26.3)
Ig Kappa Free Light Chain: 21.1 mg/L — ABNORMAL HIGH (ref 3.3–19.4)
Kappa/Lambda FluidC Ratio: 2.57 — ABNORMAL HIGH (ref 0.26–1.65)

## 2017-05-11 MED ORDER — PROCHLORPERAZINE MALEATE 10 MG PO TABS
ORAL_TABLET | ORAL | Status: AC
  Filled 2017-05-11: qty 1

## 2017-05-11 MED ORDER — SODIUM CHLORIDE 0.9 % IV SOLN
Freq: Once | INTRAVENOUS | Status: AC
  Administered 2017-05-11: 09:00:00 via INTRAVENOUS

## 2017-05-11 MED ORDER — SODIUM CHLORIDE 0.9% FLUSH
10.0000 mL | INTRAVENOUS | Status: DC | PRN
  Administered 2017-05-11: 10 mL
  Filled 2017-05-11: qty 10

## 2017-05-11 MED ORDER — DEXTROSE 5 % IV SOLN
54.0000 mg | Freq: Once | INTRAVENOUS | Status: AC
  Administered 2017-05-11: 54 mg via INTRAVENOUS
  Filled 2017-05-11: qty 27

## 2017-05-11 MED ORDER — HEPARIN SOD (PORK) LOCK FLUSH 100 UNIT/ML IV SOLN
500.0000 [IU] | Freq: Once | INTRAVENOUS | Status: AC | PRN
  Administered 2017-05-11: 500 [IU]
  Filled 2017-05-11: qty 5

## 2017-05-11 MED ORDER — DEXAMETHASONE SODIUM PHOSPHATE 10 MG/ML IJ SOLN
10.0000 mg | Freq: Once | INTRAMUSCULAR | Status: AC
  Administered 2017-05-11: 10 mg via INTRAVENOUS

## 2017-05-11 MED ORDER — DEXAMETHASONE SODIUM PHOSPHATE 10 MG/ML IJ SOLN
INTRAMUSCULAR | Status: AC
  Filled 2017-05-11: qty 1

## 2017-05-11 MED ORDER — PROCHLORPERAZINE MALEATE 10 MG PO TABS
10.0000 mg | ORAL_TABLET | Freq: Four times a day (QID) | ORAL | Status: DC | PRN
  Administered 2017-05-11: 10 mg via ORAL

## 2017-05-11 NOTE — Patient Instructions (Signed)
[facility] Discharge Instructions for Patients Receiving Chemotherapy  Today you received the following chemotherapy agents:  Kyprolis  To help prevent nausea and vomiting after your treatment, we encourage you to take your nausea medication as ordered per MD.    If you develop nausea and vomiting that is not controlled by your nausea medication, call the clinic.   BELOW ARE SYMPTOMS THAT SHOULD BE REPORTED IMMEDIATELY:  *FEVER GREATER THAN 100.5 F  *CHILLS WITH OR WITHOUT FEVER  NAUSEA AND VOMITING THAT IS NOT CONTROLLED WITH YOUR NAUSEA MEDICATION  *UNUSUAL SHORTNESS OF BREATH  *UNUSUAL BRUISING OR BLEEDING  TENDERNESS IN MOUTH AND THROAT WITH OR WITHOUT PRESENCE OF ULCERS  *URINARY PROBLEMS  *BOWEL PROBLEMS  UNUSUAL RASH Items with * indicate a potential emergency and should be followed up as soon as possible.  Feel free to call the clinic should you have any questions or concerns. The clinic phone number is [phone].  Please show the CHEMO ALERT CARD at check-in to the Emergency Department and triage nurse.   

## 2017-05-11 NOTE — Progress Notes (Signed)
Pt states that she had nausea/vomiting and diarrhea on 05/09/17 and slept all day on 05/10/17.  Her diarrhea stopped yesterday morning, but nausea has continued.  Pt is requesting additional medication for nausea at this time.  Dr. Marin Olp notified of above and order received to give pt Compazine 10 mg PO now.

## 2017-05-13 LAB — PROTEIN ELECTROPHORESIS, SERUM, WITH REFLEX
A/G Ratio: 0.7 (ref 0.7–1.7)
ALBUMIN: 3.2 g/dL (ref 2.9–4.4)
ALPHA 1: 0.3 g/dL (ref 0.0–0.4)
ALPHA 2: 0.5 g/dL (ref 0.4–1.0)
Beta: 1 g/dL (ref 0.7–1.3)
Gamma Globulin: 2.8 g/dL — ABNORMAL HIGH (ref 0.4–1.8)
Globulin, Total: 4.5 g/dL — ABNORMAL HIGH (ref 2.2–3.9)
INTERPRETATION(SEE BELOW): 0
M-SPIKE, %: 2.4 g/dL — AB
Total Protein: 7.7 g/dL (ref 6.0–8.5)

## 2017-05-16 ENCOUNTER — Inpatient Hospital Stay: Payer: 59

## 2017-05-16 ENCOUNTER — Inpatient Hospital Stay: Payer: 59 | Admitting: Family

## 2017-05-16 ENCOUNTER — Inpatient Hospital Stay: Payer: 59 | Attending: Hematology & Oncology

## 2017-05-16 DIAGNOSIS — Z5112 Encounter for antineoplastic immunotherapy: Secondary | ICD-10-CM | POA: Diagnosis not present

## 2017-05-16 DIAGNOSIS — Z5111 Encounter for antineoplastic chemotherapy: Secondary | ICD-10-CM | POA: Insufficient documentation

## 2017-05-16 DIAGNOSIS — M899 Disorder of bone, unspecified: Secondary | ICD-10-CM

## 2017-05-16 DIAGNOSIS — C9000 Multiple myeloma not having achieved remission: Secondary | ICD-10-CM

## 2017-05-16 DIAGNOSIS — C9002 Multiple myeloma in relapse: Secondary | ICD-10-CM

## 2017-05-16 LAB — CMP (CANCER CENTER ONLY)
ALT: 31 U/L (ref 0–55)
ANION GAP: 10 (ref 5–15)
AST: 32 U/L (ref 5–34)
Albumin: 3.1 g/dL — ABNORMAL LOW (ref 3.5–5.0)
Alkaline Phosphatase: 98 U/L (ref 40–150)
BILIRUBIN TOTAL: 0.8 mg/dL (ref 0.2–1.2)
BUN: 21 mg/dL (ref 7–26)
CO2: 25 mmol/L (ref 22–29)
Calcium: 8.6 mg/dL (ref 8.4–10.4)
Chloride: 110 mmol/L — ABNORMAL HIGH (ref 98–109)
Creatinine: 1.5 mg/dL — ABNORMAL HIGH (ref 0.70–1.30)
GFR, EST AFRICAN AMERICAN: 42 mL/min — AB (ref >60)
GFR, EST NON AFRICAN AMERICAN: 36 mL/min — AB (ref >60)
Glucose, Bld: 90 mg/dL (ref 70–140)
POTASSIUM: 4.1 mmol/L (ref 3.3–4.7)
Sodium: 145 mmol/L (ref 136–145)
TOTAL PROTEIN: 7.2 g/dL (ref 6.4–8.3)

## 2017-05-16 LAB — CBC WITH DIFFERENTIAL (CANCER CENTER ONLY)
ABS GRANULOCYTE: 1.4 10*3/uL — AB (ref 1.5–6.5)
Basophils Absolute: 0 10*3/uL (ref 0.0–0.1)
Basophils Relative: 0 %
EOS PCT: 3 %
Eosinophils Absolute: 0.1 10*3/uL (ref 0.0–0.5)
HEMATOCRIT: 27.6 % — AB (ref 34.8–46.6)
Hemoglobin: 9.4 g/dL — ABNORMAL LOW (ref 11.6–15.9)
LYMPHS PCT: 19 %
Lymphs Abs: 0.4 10*3/uL — ABNORMAL LOW (ref 0.9–3.3)
MCH: 29 pg (ref 26.0–34.0)
MCHC: 34.1 g/dL (ref 32.0–36.0)
MCV: 85.2 fL (ref 81.0–101.0)
MONO ABS: 0.4 10*3/uL (ref 0.1–0.9)
MONOS PCT: 18 %
Neutro Abs: 1.4 10*3/uL — ABNORMAL LOW (ref 1.5–6.5)
Neutrophils Relative %: 60 %
Platelet Count: 57 10*3/uL — ABNORMAL LOW (ref 145–400)
RBC: 3.24 MIL/uL — ABNORMAL LOW (ref 3.70–5.32)
RDW: 17.4 % — AB (ref 11.1–15.7)
WBC Count: 2.4 10*3/uL — ABNORMAL LOW (ref 4.0–10.3)

## 2017-05-16 MED ORDER — DEXTROSE 5 % IV SOLN
54.0000 mg | Freq: Once | INTRAVENOUS | Status: AC
  Administered 2017-05-16: 54 mg via INTRAVENOUS
  Filled 2017-05-16: qty 27

## 2017-05-16 MED ORDER — DEXAMETHASONE SODIUM PHOSPHATE 10 MG/ML IJ SOLN
INTRAMUSCULAR | Status: AC
  Filled 2017-05-16: qty 1

## 2017-05-16 MED ORDER — DEXAMETHASONE SODIUM PHOSPHATE 10 MG/ML IJ SOLN
10.0000 mg | Freq: Once | INTRAMUSCULAR | Status: AC
  Administered 2017-05-16: 10 mg via INTRAVENOUS

## 2017-05-16 MED ORDER — CYCLOPHOSPHAMIDE CHEMO INJECTION 1 GM
300.0000 mg/m2 | Freq: Once | INTRAMUSCULAR | Status: AC
  Administered 2017-05-16: 560 mg via INTRAVENOUS
  Filled 2017-05-16: qty 28

## 2017-05-16 MED ORDER — HEPARIN SOD (PORK) LOCK FLUSH 100 UNIT/ML IV SOLN
250.0000 [IU] | Freq: Once | INTRAVENOUS | Status: AC | PRN
  Administered 2017-05-16: 500 [IU]
  Filled 2017-05-16: qty 5

## 2017-05-16 MED ORDER — SODIUM CHLORIDE 0.9 % IV SOLN
Freq: Once | INTRAVENOUS | Status: AC
  Administered 2017-05-16: 500 mL via INTRAVENOUS

## 2017-05-16 MED ORDER — PALONOSETRON HCL INJECTION 0.25 MG/5ML
INTRAVENOUS | Status: AC
  Filled 2017-05-16: qty 5

## 2017-05-16 MED ORDER — SODIUM CHLORIDE 0.9 % IJ SOLN
3.0000 mL | Freq: Once | INTRAMUSCULAR | Status: AC | PRN
  Administered 2017-05-16: 10 mL via INTRAVENOUS
  Filled 2017-05-16: qty 10

## 2017-05-16 MED ORDER — PALONOSETRON HCL INJECTION 0.25 MG/5ML
0.2500 mg | Freq: Once | INTRAVENOUS | Status: AC
  Administered 2017-05-16: 0.25 mg via INTRAVENOUS

## 2017-05-16 NOTE — Patient Instructions (Signed)
Cyclophosphamide injection What is this medicine? CYCLOPHOSPHAMIDE (sye kloe FOSS fa mide) is a chemotherapy drug. It slows the growth of cancer cells. This medicine is used to treat many types of cancer like lymphoma, myeloma, leukemia, breast cancer, and ovarian cancer, to name a few. This medicine may be used for other purposes; ask your health care provider or pharmacist if you have questions. COMMON BRAND NAME(S): Cytoxan, Neosar What should I tell my health care provider before I take this medicine? They need to know if you have any of these conditions: -blood disorders -history of other chemotherapy -infection -kidney disease -liver disease -recent or ongoing radiation therapy -tumors in the bone marrow -an unusual or allergic reaction to cyclophosphamide, other chemotherapy, other medicines, foods, dyes, or preservatives -pregnant or trying to get pregnant -breast-feeding How should I use this medicine? This drug is usually given as an injection into a vein or muscle or by infusion into a vein. It is administered in a hospital or clinic by a specially trained health care professional. Talk to your pediatrician regarding the use of this medicine in children. Special care may be needed. Overdosage: If you think you have taken too much of this medicine contact a poison control center or emergency room at once. NOTE: This medicine is only for you. Do not share this medicine with others. What if I miss a dose? It is important not to miss your dose. Call your doctor or health care professional if you are unable to keep an appointment. What may interact with this medicine? This medicine may interact with the following medications: -amiodarone -amphotericin B -azathioprine -certain antiviral medicines for HIV or AIDS such as protease inhibitors (e.g., indinavir, ritonavir) and zidovudine -certain blood pressure medications such as benazepril, captopril, enalapril, fosinopril,  lisinopril, moexipril, monopril, perindopril, quinapril, ramipril, trandolapril -certain cancer medications such as anthracyclines (e.g., daunorubicin, doxorubicin), busulfan, cytarabine, paclitaxel, pentostatin, tamoxifen, trastuzumab -certain diuretics such as chlorothiazide, chlorthalidone, hydrochlorothiazide, indapamide, metolazone -certain medicines that treat or prevent blood clots like warfarin -certain muscle relaxants such as succinylcholine -cyclosporine -etanercept -indomethacin -medicines to increase blood counts like filgrastim, pegfilgrastim, sargramostim -medicines used as general anesthesia -metronidazole -natalizumab This list may not describe all possible interactions. Give your health care provider a list of all the medicines, herbs, non-prescription drugs, or dietary supplements you use. Also tell them if you smoke, drink alcohol, or use illegal drugs. Some items may interact with your medicine. What should I watch for while using this medicine? Visit your doctor for checks on your progress. This drug may make you feel generally unwell. This is not uncommon, as chemotherapy can affect healthy cells as well as cancer cells. Report any side effects. Continue your course of treatment even though you feel ill unless your doctor tells you to stop. Drink water or other fluids as directed. Urinate often, even at night. In some cases, you may be given additional medicines to help with side effects. Follow all directions for their use. Call your doctor or health care professional for advice if you get a fever, chills or sore throat, or other symptoms of a cold or flu. Do not treat yourself. This drug decreases your body's ability to fight infections. Try to avoid being around people who are sick. This medicine may increase your risk to bruise or bleed. Call your doctor or health care professional if you notice any unusual bleeding. Be careful brushing and flossing your teeth or using a  toothpick because you may get an infection or bleed   more easily. If you have any dental work done, tell your dentist you are receiving this medicine. You may get drowsy or dizzy. Do not drive, use machinery, or do anything that needs mental alertness until you know how this medicine affects you. Do not become pregnant while taking this medicine or for 1 year after stopping it. Women should inform their doctor if they wish to become pregnant or think they might be pregnant. Men should not father a child while taking this medicine and for 4 months after stopping it. There is a potential for serious side effects to an unborn child. Talk to your health care professional or pharmacist for more information. Do not breast-feed an infant while taking this medicine. This medicine may interfere with the ability to have a child. This medicine has caused ovarian failure in some women. This medicine has caused reduced sperm counts in some men. You should talk with your doctor or health care professional if you are concerned about your fertility. If you are going to have surgery, tell your doctor or health care professional that you have taken this medicine. What side effects may I notice from receiving this medicine? Side effects that you should report to your doctor or health care professional as soon as possible: -allergic reactions like skin rash, itching or hives, swelling of the face, lips, or tongue -low blood counts - this medicine may decrease the number of white blood cells, red blood cells and platelets. You may be at increased risk for infections and bleeding. -signs of infection - fever or chills, cough, sore throat, pain or difficulty passing urine -signs of decreased platelets or bleeding - bruising, pinpoint red spots on the skin, black, tarry stools, blood in the urine -signs of decreased red blood cells - unusually weak or tired, fainting spells, lightheadedness -breathing problems -dark  urine -dizziness -palpitations -swelling of the ankles, feet, hands -trouble passing urine or change in the amount of urine -weight gain -yellowing of the eyes or skin Side effects that usually do not require medical attention (report to your doctor or health care professional if they continue or are bothersome): -changes in nail or skin color -hair loss -missed menstrual periods -mouth sores -nausea, vomiting This list may not describe all possible side effects. Call your doctor for medical advice about side effects. You may report side effects to FDA at 1-800-FDA-1088. Where should I keep my medicine? This drug is given in a hospital or clinic and will not be stored at home. NOTE: This sheet is a summary. It may not cover all possible information. If you have questions about this medicine, talk to your doctor, pharmacist, or health care provider.  2018 Elsevier/Gold Standard (2012-03-10 16:22:58) Carfilzomib injection What is this medicine? CARFILZOMIB (kar FILZ oh mib) targets a specific protein within cancer cells and stops the cancer cells from growing. It is used to treat multiple myeloma. This medicine may be used for other purposes; ask your health care provider or pharmacist if you have questions. COMMON BRAND NAME(S): KYPROLIS What should I tell my health care provider before I take this medicine? They need to know if you have any of these conditions: -heart disease -history of blood clots -irregular heartbeat -kidney disease -liver disease -lung or breathing disease -an unusual or allergic reaction to carfilzomib, or other medicines, foods, dyes, or preservatives -pregnant or trying to get pregnant -breast-feeding How should I use this medicine? This medicine is for injection or infusion into a vein. It is given  by a health care professional in a hospital or clinic setting. Talk to your pediatrician regarding the use of this medicine in children. Special care may be  needed. Overdosage: If you think you have taken too much of this medicine contact a poison control center or emergency room at once. NOTE: This medicine is only for you. Do not share this medicine with others. What if I miss a dose? It is important not to miss your dose. Call your doctor or health care professional if you are unable to keep an appointment. What may interact with this medicine? Interactions are not expected. Give your health care provider a list of all the medicines, herbs, non-prescription drugs, or dietary supplements you use. Also tell them if you smoke, drink alcohol, or use illegal drugs. Some items may interact with your medicine. This list may not describe all possible interactions. Give your health care provider a list of all the medicines, herbs, non-prescription drugs, or dietary supplements you use. Also tell them if you smoke, drink alcohol, or use illegal drugs. Some items may interact with your medicine. What should I watch for while using this medicine? Your condition will be monitored carefully while you are receiving this medicine. Report any side effects. Continue your course of treatment even though you feel ill unless your doctor tells you to stop. You may need blood work done while you are taking this medicine. Do not become pregnant while taking this medicine or for at least 30 days after stopping it. Women should inform their doctor if they wish to become pregnant or think they might be pregnant. There is a potential for serious side effects to an unborn child. Men should not father a child while taking this medicine and for 90 days after stopping it. Talk to your health care professional or pharmacist for more information. Do not breast-feed an infant while taking this medicine. Check with your doctor or health care professional if you get an attack of severe diarrhea, nausea and vomiting, or if you sweat a lot. The loss of too much body fluid can make it  dangerous for you to take this medicine. You may get dizzy. Do not drive, use machinery, or do anything that needs mental alertness until you know how this medicine affects you. Do not stand or sit up quickly, especially if you are an older patient. This reduces the risk of dizzy or fainting spells. What side effects may I notice from receiving this medicine? Side effects that you should report to your doctor or health care professional as soon as possible: -allergic reactions like skin rash, itching or hives, swelling of the face, lips, or tongue -confusion -dizziness -feeling faint or lightheaded -fever or chills -palpitations -seizures -signs and symptoms of bleeding such as bloody or black, tarry stools; red or dark-brown urine; spitting up blood or brown material that looks like coffee grounds; red spots on the skin; unusual bruising or bleeding including from the eye, gums, or nose -signs and symptoms of a blood clot such as breathing problems; changes in vision; chest pain; severe, sudden headache; pain, swelling, warmth in the leg; trouble speaking; sudden numbness or weakness of the face, arm or leg -signs and symptoms of kidney injury like trouble passing urine or change in the amount of urine -signs and symptoms of liver injury like dark yellow or brown urine; general ill feeling or flu-like symptoms; light-colored stools; loss of appetite; nausea; right upper belly pain; unusually weak or tired; yellowing of the   eyes or skin Side effects that usually do not require medical attention (report to your doctor or health care professional if they continue or are bothersome): -back pain -cough -diarrhea -headache -muscle cramps -vomiting This list may not describe all possible side effects. Call your doctor for medical advice about side effects. You may report side effects to FDA at 1-800-FDA-1088. Where should I keep my medicine? This drug is given in a hospital or clinic and will not  be stored at home. NOTE: This sheet is a summary. It may not cover all possible information. If you have questions about this medicine, talk to your doctor, pharmacist, or health care provider.  2018 Elsevier/Gold Standard (2015-05-29 13:39:23)  

## 2017-05-17 ENCOUNTER — Inpatient Hospital Stay: Payer: 59

## 2017-05-17 DIAGNOSIS — C9000 Multiple myeloma not having achieved remission: Secondary | ICD-10-CM

## 2017-05-17 DIAGNOSIS — Z5112 Encounter for antineoplastic immunotherapy: Secondary | ICD-10-CM | POA: Diagnosis not present

## 2017-05-17 DIAGNOSIS — C9002 Multiple myeloma in relapse: Secondary | ICD-10-CM

## 2017-05-17 MED ORDER — SODIUM CHLORIDE 0.9 % IV SOLN
Freq: Once | INTRAVENOUS | Status: AC
  Administered 2017-05-17: 09:00:00 via INTRAVENOUS

## 2017-05-17 MED ORDER — DEXAMETHASONE SODIUM PHOSPHATE 10 MG/ML IJ SOLN
INTRAMUSCULAR | Status: AC
Start: 2017-05-17 — End: ?
  Filled 2017-05-17: qty 1

## 2017-05-17 MED ORDER — DEXAMETHASONE SODIUM PHOSPHATE 10 MG/ML IJ SOLN
10.0000 mg | Freq: Once | INTRAMUSCULAR | Status: AC
  Administered 2017-05-17: 10 mg via INTRAVENOUS

## 2017-05-17 MED ORDER — SODIUM CHLORIDE 0.9% FLUSH
10.0000 mL | INTRAVENOUS | Status: DC | PRN
  Administered 2017-05-17: 10 mL
  Filled 2017-05-17: qty 10

## 2017-05-17 MED ORDER — HEPARIN SOD (PORK) LOCK FLUSH 100 UNIT/ML IV SOLN
500.0000 [IU] | Freq: Once | INTRAVENOUS | Status: AC | PRN
  Administered 2017-05-17: 500 [IU]
  Filled 2017-05-17: qty 5

## 2017-05-17 MED ORDER — DEXTROSE 5 % IV SOLN
54.0000 mg | Freq: Once | INTRAVENOUS | Status: AC
  Administered 2017-05-17: 54 mg via INTRAVENOUS
  Filled 2017-05-17: qty 27

## 2017-05-17 NOTE — Progress Notes (Signed)
Platelets 57,000 yesterday.  Ok to proceed with treatmentper Dr Marin Olp

## 2017-05-17 NOTE — Patient Instructions (Signed)
Carfilzomib injection What is this medicine? CARFILZOMIB (kar FILZ oh mib) targets a specific protein within cancer cells and stops the cancer cells from growing. It is used to treat multiple myeloma. This medicine may be used for other purposes; ask your health care provider or pharmacist if you have questions. COMMON BRAND NAME(S): KYPROLIS What should I tell my health care provider before I take this medicine? They need to know if you have any of these conditions: -heart disease -history of blood clots -irregular heartbeat -kidney disease -liver disease -lung or breathing disease -an unusual or allergic reaction to carfilzomib, or other medicines, foods, dyes, or preservatives -pregnant or trying to get pregnant -breast-feeding How should I use this medicine? This medicine is for injection or infusion into a vein. It is given by a health care professional in a hospital or clinic setting. Talk to your pediatrician regarding the use of this medicine in children. Special care may be needed. Overdosage: If you think you have taken too much of this medicine contact a poison control center or emergency room at once. NOTE: This medicine is only for you. Do not share this medicine with others. What if I miss a dose? It is important not to miss your dose. Call your doctor or health care professional if you are unable to keep an appointment. What may interact with this medicine? Interactions are not expected. Give your health care provider a list of all the medicines, herbs, non-prescription drugs, or dietary supplements you use. Also tell them if you smoke, drink alcohol, or use illegal drugs. Some items may interact with your medicine. This list may not describe all possible interactions. Give your health care provider a list of all the medicines, herbs, non-prescription drugs, or dietary supplements you use. Also tell them if you smoke, drink alcohol, or use illegal drugs. Some items may  interact with your medicine. What should I watch for while using this medicine? Your condition will be monitored carefully while you are receiving this medicine. Report any side effects. Continue your course of treatment even though you feel ill unless your doctor tells you to stop. You may need blood work done while you are taking this medicine. Do not become pregnant while taking this medicine or for at least 30 days after stopping it. Women should inform their doctor if they wish to become pregnant or think they might be pregnant. There is a potential for serious side effects to an unborn child. Men should not father a child while taking this medicine and for 90 days after stopping it. Talk to your health care professional or pharmacist for more information. Do not breast-feed an infant while taking this medicine. Check with your doctor or health care professional if you get an attack of severe diarrhea, nausea and vomiting, or if you sweat a lot. The loss of too much body fluid can make it dangerous for you to take this medicine. You may get dizzy. Do not drive, use machinery, or do anything that needs mental alertness until you know how this medicine affects you. Do not stand or sit up quickly, especially if you are an older patient. This reduces the risk of dizzy or fainting spells. What side effects may I notice from receiving this medicine? Side effects that you should report to your doctor or health care professional as soon as possible: -allergic reactions like skin rash, itching or hives, swelling of the face, lips, or tongue -confusion -dizziness -feeling faint or lightheaded -fever or chills -  palpitations -seizures -signs and symptoms of bleeding such as bloody or black, tarry stools; red or dark-brown urine; spitting up blood or brown material that looks like coffee grounds; red spots on the skin; unusual bruising or bleeding including from the eye, gums, or nose -signs and symptoms of  a blood clot such as breathing problems; changes in vision; chest pain; severe, sudden headache; pain, swelling, warmth in the leg; trouble speaking; sudden numbness or weakness of the face, arm or leg -signs and symptoms of kidney injury like trouble passing urine or change in the amount of urine -signs and symptoms of liver injury like dark yellow or brown urine; general ill feeling or flu-like symptoms; light-colored stools; loss of appetite; nausea; right upper belly pain; unusually weak or tired; yellowing of the eyes or skin Side effects that usually do not require medical attention (report to your doctor or health care professional if they continue or are bothersome): -back pain -cough -diarrhea -headache -muscle cramps -vomiting This list may not describe all possible side effects. Call your doctor for medical advice about side effects. You may report side effects to FDA at 1-800-FDA-1088. Where should I keep my medicine? This drug is given in a hospital or clinic and will not be stored at home. NOTE: This sheet is a summary. It may not cover all possible information. If you have questions about this medicine, talk to your doctor, pharmacist, or health care provider.  2018 Elsevier/Gold Standard (2015-05-29 13:39:23)  

## 2017-05-19 DIAGNOSIS — D709 Neutropenia, unspecified: Secondary | ICD-10-CM | POA: Diagnosis not present

## 2017-05-19 DIAGNOSIS — I509 Heart failure, unspecified: Secondary | ICD-10-CM | POA: Diagnosis not present

## 2017-05-19 LAB — KAPPA/LAMBDA LIGHT CHAINS
KAPPA, LAMDA LIGHT CHAIN RATIO: 1.89 — AB (ref 0.26–1.65)
Kappa free light chain: 17.6 mg/L (ref 3.3–19.4)
Lambda free light chains: 9.3 mg/L (ref 5.7–26.3)

## 2017-05-19 LAB — IGG, IGA, IGM
IGA: 17 mg/dL — AB (ref 87–352)
IGM (IMMUNOGLOBULIN M), SRM: 17 mg/dL — AB (ref 26–217)
IgG (Immunoglobin G), Serum: 2333 mg/dL — ABNORMAL HIGH (ref 700–1600)

## 2017-05-20 ENCOUNTER — Other Ambulatory Visit: Payer: Self-pay | Admitting: *Deleted

## 2017-05-20 DIAGNOSIS — C9000 Multiple myeloma not having achieved remission: Secondary | ICD-10-CM

## 2017-05-20 LAB — PROTEIN ELECTROPHORESIS, SERUM, WITH REFLEX
A/G RATIO SPE: 0.8 (ref 0.7–1.7)
ALBUMIN ELP: 2.9 g/dL (ref 2.9–4.4)
Alpha-1-Globulin: 0.2 g/dL (ref 0.0–0.4)
Alpha-2-Globulin: 0.4 g/dL (ref 0.4–1.0)
Beta Globulin: 0.9 g/dL (ref 0.7–1.3)
Gamma Globulin: 2.1 g/dL — ABNORMAL HIGH (ref 0.4–1.8)
Globulin, Total: 3.7 g/dL (ref 2.2–3.9)
M-SPIKE, %: 2 g/dL — AB
SPEP INTERP: 0
Total Protein ELP: 6.6 g/dL (ref 6.0–8.5)

## 2017-05-23 ENCOUNTER — Inpatient Hospital Stay (HOSPITAL_BASED_OUTPATIENT_CLINIC_OR_DEPARTMENT_OTHER): Payer: 59 | Admitting: Hematology & Oncology

## 2017-05-23 ENCOUNTER — Other Ambulatory Visit: Payer: Self-pay

## 2017-05-23 ENCOUNTER — Inpatient Hospital Stay: Payer: 59

## 2017-05-23 VITALS — BP 144/78 | HR 85 | Temp 97.9°F | Resp 18 | Wt 169.2 lb

## 2017-05-23 DIAGNOSIS — C9000 Multiple myeloma not having achieved remission: Secondary | ICD-10-CM

## 2017-05-23 DIAGNOSIS — C9002 Multiple myeloma in relapse: Secondary | ICD-10-CM

## 2017-05-23 DIAGNOSIS — Z5112 Encounter for antineoplastic immunotherapy: Secondary | ICD-10-CM | POA: Diagnosis not present

## 2017-05-23 LAB — CBC WITH DIFFERENTIAL (CANCER CENTER ONLY)
BASOS ABS: 0 10*3/uL (ref 0.0–0.1)
Basophils Relative: 1 %
Eosinophils Absolute: 0 10*3/uL (ref 0.0–0.5)
Eosinophils Relative: 3 %
HEMATOCRIT: 26 % — AB (ref 34.8–46.6)
Hemoglobin: 8.7 g/dL — ABNORMAL LOW (ref 11.6–15.9)
LYMPHS PCT: 25 %
Lymphs Abs: 0.4 10*3/uL — ABNORMAL LOW (ref 0.9–3.3)
MCH: 29 pg (ref 26.0–34.0)
MCHC: 33.5 g/dL (ref 32.0–36.0)
MCV: 86.7 fL (ref 81.0–101.0)
Monocytes Absolute: 0.3 10*3/uL (ref 0.1–0.9)
Monocytes Relative: 20 %
NEUTROS ABS: 0.7 10*3/uL — AB (ref 1.5–6.5)
Neutrophils Relative %: 51 %
PLATELETS: 53 10*3/uL — AB (ref 145–400)
RBC: 3 MIL/uL — AB (ref 3.70–5.32)
RDW: 17.7 % — ABNORMAL HIGH (ref 11.1–15.7)
WBC: 1.4 10*3/uL — AB (ref 3.9–10.3)

## 2017-05-23 LAB — CMP (CANCER CENTER ONLY)
ALK PHOS: 85 U/L — AB (ref 26–84)
ALT: 25 U/L (ref 0–55)
ANION GAP: 13 (ref 5–15)
AST: 24 U/L (ref 5–34)
Albumin: 3.3 g/dL — ABNORMAL LOW (ref 3.5–5.0)
BUN: 14 mg/dL (ref 7–22)
CO2: 25 mmol/L (ref 18–33)
Calcium: 8.7 mg/dL (ref 8.0–10.3)
Chloride: 107 mmol/L (ref 98–108)
Creatinine: 1 mg/dL (ref 0.60–1.10)
Glucose, Bld: 85 mg/dL (ref 73–118)
POTASSIUM: 4 mmol/L (ref 3.5–5.1)
Sodium: 145 mmol/L (ref 128–145)
TOTAL PROTEIN: 6.9 g/dL (ref 6.4–8.1)
Total Bilirubin: 1 mg/dL (ref 0.2–1.2)

## 2017-05-23 MED ORDER — SODIUM CHLORIDE 0.9% FLUSH
10.0000 mL | INTRAVENOUS | Status: DC | PRN
  Administered 2017-05-23: 10 mL
  Filled 2017-05-23: qty 10

## 2017-05-23 MED ORDER — DEXAMETHASONE SODIUM PHOSPHATE 10 MG/ML IJ SOLN
INTRAMUSCULAR | Status: AC
  Filled 2017-05-23: qty 1

## 2017-05-23 MED ORDER — HEPARIN SOD (PORK) LOCK FLUSH 100 UNIT/ML IV SOLN
500.0000 [IU] | Freq: Once | INTRAVENOUS | Status: AC | PRN
  Administered 2017-05-23: 500 [IU]
  Filled 2017-05-23: qty 5

## 2017-05-23 MED ORDER — SODIUM CHLORIDE 0.9 % IV SOLN
300.0000 mg/m2 | Freq: Once | INTRAVENOUS | Status: AC
  Administered 2017-05-23: 560 mg via INTRAVENOUS
  Filled 2017-05-23: qty 28

## 2017-05-23 MED ORDER — DEXAMETHASONE SODIUM PHOSPHATE 10 MG/ML IJ SOLN
10.0000 mg | Freq: Once | INTRAMUSCULAR | Status: AC
  Administered 2017-05-23: 10 mg via INTRAVENOUS

## 2017-05-23 MED ORDER — SODIUM CHLORIDE 0.9 % IV SOLN
Freq: Once | INTRAVENOUS | Status: AC
  Administered 2017-05-23: 11:00:00 via INTRAVENOUS

## 2017-05-23 MED ORDER — PALONOSETRON HCL INJECTION 0.25 MG/5ML
0.2500 mg | Freq: Once | INTRAVENOUS | Status: AC
  Administered 2017-05-23: 0.25 mg via INTRAVENOUS

## 2017-05-23 MED ORDER — PALONOSETRON HCL INJECTION 0.25 MG/5ML
INTRAVENOUS | Status: AC
  Filled 2017-05-23: qty 5

## 2017-05-23 MED ORDER — DEXTROSE 5 % IV SOLN
54.0000 mg | Freq: Once | INTRAVENOUS | Status: AC
  Administered 2017-05-23: 54 mg via INTRAVENOUS
  Filled 2017-05-23: qty 27

## 2017-05-23 NOTE — Progress Notes (Signed)
Hematology and Oncology Follow Up Visit  Barbara Cook [id number] 1956-04-29 62 y.o. 05/23/2017   Principle Diagnosis:  IgG kappa myeloma Hypercalcemia of malignancy  Current Therapy:   Daratumumab q weekly therapy - s/p cycle #3 - d/c'ed due to progression of myeloma Kyprolis/Cytoxan/Decadron - s/p cycle #2 - started on 04/11/2017 Zometa every 3 months    Interim History:  Barbara Cook is here today with her husband.  She is doing better.  She has responded quite nicely to treatment.  Her myeloma studies have improved really well.  Her M spike is now down to 2 g/dL.  Her IgG level is down to 2333 milligrams per deciliter.  Her kappa light chain is 1.8 mg/dL.  She, again, has done quite well.  She had a nice Christmas.  She had a good New Year's.  She does not have as much back discomfort.  There is no issues with nausea or vomiting.  She has had some diarrhea but again this appears to be a little bit better.  She has had no fever.  She has had no bleeding.  She has had no cough or shortness of breath.  Overall, her performance status is ECOG 1.  Medications:  Allergies as of 05/23/2017      Reactions   Codeine Palpitations      Medication List        Accurate as of 05/23/17 11:39 AM. Always use your most recent med list.          acetaminophen 500 MG tablet Commonly known as:  TYLENOL Take 500 mg by mouth every 6 (six) hours as needed for mild pain or headache.   acyclovir 400 MG tablet Commonly known as:  ZOVIRAX Take 1 tablet (400 mg total) by mouth daily.   benzonatate 100 MG capsule Commonly known as:  TESSALON PERLES Take 2 capsules (200 mg total) 3 (three) times daily as needed by mouth for cough.   diazepam 2 MG tablet Commonly known as:  VALIUM Take 2 tablets (4 mg total) by mouth every 8 (eight) hours as needed for anxiety.   hyoscyamine 0.125 MG SL tablet Commonly known as:  LEVSIN SL Place 1 tablet (0.125 mg total) under the tongue every 4  (four) hours as needed.   magnesium oxide 400 (241.3 Mg) MG tablet Commonly known as:  MAG-OX TAKE 1 TABLET BY MOUTH TWICE A DAY   multivitamin tablet Take 1 tablet by mouth every evening.   ondansetron 8 MG tablet Commonly known as:  ZOFRAN TAKE 1 TABLET BY MOUTH TWICE A DAY FOR NAUSEA AND VOMITING 1 TABLET 1 HOUR PRIOR TO CHEMOTHERAPY   pantoprazole 40 MG tablet Commonly known as:  PROTONIX Take 1 tablet (40 mg total) by mouth daily.   polyethylene glycol powder powder Commonly known as:  GLYCOLAX/MIRALAX 1 capful daily as needed   prochlorperazine 10 MG tablet Commonly known as:  COMPAZINE Take 1 tablet (10 mg total) by mouth every 6 (six) hours as needed for nausea or vomiting.   torsemide 20 MG tablet Commonly known as:  DEMADEX Take 1 tablet (20 mg total) by mouth daily as needed.   traMADol 50 MG tablet Commonly known as:  ULTRAM Take 1 tablet (50 mg total) every 6 (six) hours as needed by mouth.   vitamin B-6 250 MG tablet Take 1 tablet (250 mg total) by mouth daily.   Vitamin D (Ergocalciferol) 50000 units Caps capsule Commonly known as:  DRISDOL TAKE 1 CAPSULE (50,000 UNITS TOTAL)  BY MOUTH ONCE A WEEK.       Allergies:  Allergies  Allergen Reactions  . Codeine Palpitations    Past Medical History, Surgical history, Social history, and Family History were reviewed and updated.  Review of Systems: As stated in the interim history.   Physical Exam:  weight is 169 lb 4 oz (76.8 kg). Her oral temperature is 97.9 F (36.6 C). Her blood pressure is 144/78 (abnormal) and her pulse is 85. Her respiration is 18 and oxygen saturation is 99%.   Wt Readings from Last 3 Encounters:  05/23/17 169 lb 4 oz (76.8 kg)  05/23/17 169 lb (76.7 kg)  04/25/17 169 lb (76.7 kg)    Physical Exam  Constitutional: She is oriented to person, place, and time.  HENT:  Head: Normocephalic and atraumatic.  Mouth/Throat: Oropharynx is clear and moist.  Eyes: EOM are  normal. Pupils are equal, round, and reactive to light.  Neck: Normal range of motion.  Cardiovascular: Normal rate, regular rhythm and normal heart sounds.  Pulmonary/Chest: Effort normal and breath sounds normal.  Abdominal: Soft. Bowel sounds are normal.  Musculoskeletal: Normal range of motion. She exhibits no edema, tenderness or deformity.  Lymphadenopathy:    She has no cervical adenopathy.  Neurological: She is alert and oriented to person, place, and time.  Skin: Skin is warm and dry. No rash noted. No erythema.  Psychiatric: She has a normal mood and affect. Her behavior is normal. Judgment and thought content normal.  Vitals reviewed.         Lab Results  Component Value Date   WBC 2.0 (L) 05/09/2017   HGB 10.0 (L) 05/09/2017   HCT 26.0 (L) 05/23/2017   MCV 86.7 05/23/2017   PLT 116 (L) 05/09/2017   Lab Results  Component Value Date   FERRITIN 183 03/19/2017   IRON 51 03/19/2017   TIBC 204 (L) 03/19/2017   UIBC 153 03/19/2017   IRONPCTSAT 25 03/19/2017   Lab Results  Component Value Date   RETICCTPCT 0.6 03/19/2017   RBC 3.00 (L) 05/23/2017   RETICCTABS 35.9 12/25/2014   Lab Results  Component Value Date   KPAFRELGTCHN 17.6 05/16/2017   LAMBDASER 9.3 05/16/2017   KAPLAMBRATIO 1.89 (H) 05/16/2017   Lab Results  Component Value Date   IGGSERUM 2,333 (H) 05/16/2017   IGA 17 (L) 05/16/2017   IGMSERUM 17 (L) 05/16/2017   Lab Results  Component Value Date   TOTALPROTELP 6.6 05/16/2017   ALBUMINELP 2.9 05/16/2017   A1GS 0.2 05/16/2017   A2GS 0.4 05/16/2017   BETS 0.9 05/16/2017   BETA2SER 0.3 05/07/2015   GAMS 2.1 (H) 05/16/2017   MSPIKE 2.0 (H) 05/16/2017   SPEI * 05/07/2015     Chemistry      Component Value Date/Time   NA 145 05/23/2017 0819   NA 143 05/09/2017 0812   NA 139 02/20/2016 1207   K 4.0 05/23/2017 0819   K 4.0 05/09/2017 0812   K 3.8 02/20/2016 1207   CL 107 05/23/2017 0819   CL 108 05/09/2017 0812   CO2 25 05/23/2017 0819    CO2 26 05/09/2017 0812   CO2 29 02/20/2016 1207   BUN 14 05/23/2017 0819   BUN 13 05/09/2017 0812   BUN 22.3 02/20/2016 1207   CREATININE 0.9 05/09/2017 0812   CREATININE 1.3 (H) 02/20/2016 1207      Component Value Date/Time   CALCIUM 8.7 05/23/2017 0819   CALCIUM 8.3 05/09/2017 0812   CALCIUM 9.7  02/20/2016 1207   ALKPHOS 85 (H) 05/23/2017 0819   ALKPHOS 123 (H) 05/09/2017 0812   ALKPHOS 62 02/20/2016 1207   AST 24 05/23/2017 0819   AST 27 02/20/2016 1207   ALT 25 05/23/2017 0819   ALT 26 05/09/2017 0812   ALT 19 02/20/2016 1207   BILITOT 1.0 05/23/2017 0819   BILITOT 0.44 02/20/2016 1207      Impression and Plan: Ms. Meske is a very pleasant 62 yo African American female with IgG Kappa myeloma.   Her blood counts are down a little bit.  However, her monocytes are up.  As such, I think we go ahead with treatment today.    Once we can get her M spike to less than 1 g/dL, then we might be able to adjust her treatment schedule.  I am just happy that her quality of life is improving.  Her hemoglobin is down a little bit.  We will have to watch this closely.    I will plan to see her back in another month.      [name], MD 1/14/201911:39 AM

## 2017-05-23 NOTE — Progress Notes (Signed)
OK to treat with today's lab values per Dr. Ennever. 

## 2017-05-23 NOTE — Patient Instructions (Signed)
[facility] Discharge Instructions for Patients Receiving Chemotherapy  Today you received the following chemotherapy agents Cytoxan and Kyprolis.  To help prevent nausea and vomiting after your treatment, we encourage you to take your nausea medication as prscribed by your docotr.   If you develop nausea and vomiting that is not controlled by your nausea medication, call the clinic.   BELOW ARE SYMPTOMS THAT SHOULD BE REPORTED IMMEDIATELY:  *FEVER GREATER THAN 100.5 F  *CHILLS WITH OR WITHOUT FEVER  NAUSEA AND VOMITING THAT IS NOT CONTROLLED WITH YOUR NAUSEA MEDICATION  *UNUSUAL SHORTNESS OF BREATH  *UNUSUAL BRUISING OR BLEEDING  TENDERNESS IN MOUTH AND THROAT WITH OR WITHOUT PRESENCE OF ULCERS  *URINARY PROBLEMS  *BOWEL PROBLEMS  UNUSUAL RASH Items with * indicate a potential emergency and should be followed up as soon as possible.  Feel free to call the clinic should you have any questions or concerns. The clinic phone number is [phone].  Please show the [facility] at check-in to the Emergency Department and triage nurse.

## 2017-05-24 ENCOUNTER — Other Ambulatory Visit: Payer: Self-pay

## 2017-05-24 ENCOUNTER — Inpatient Hospital Stay: Payer: 59

## 2017-05-24 DIAGNOSIS — Z5112 Encounter for antineoplastic immunotherapy: Secondary | ICD-10-CM | POA: Diagnosis not present

## 2017-05-24 DIAGNOSIS — C9002 Multiple myeloma in relapse: Secondary | ICD-10-CM

## 2017-05-24 DIAGNOSIS — C9000 Multiple myeloma not having achieved remission: Secondary | ICD-10-CM

## 2017-05-24 MED ORDER — HEPARIN SOD (PORK) LOCK FLUSH 100 UNIT/ML IV SOLN
500.0000 [IU] | Freq: Once | INTRAVENOUS | Status: AC | PRN
  Administered 2017-05-24: 500 [IU]
  Filled 2017-05-24: qty 5

## 2017-05-24 MED ORDER — SODIUM CHLORIDE 0.9 % IV SOLN
Freq: Once | INTRAVENOUS | Status: AC
  Administered 2017-05-24: 09:00:00 via INTRAVENOUS

## 2017-05-24 MED ORDER — DEXTROSE 5 % IV SOLN
54.0000 mg | Freq: Once | INTRAVENOUS | Status: AC
  Administered 2017-05-24: 54 mg via INTRAVENOUS
  Filled 2017-05-24: qty 27

## 2017-05-24 MED ORDER — SODIUM CHLORIDE 0.9% FLUSH
10.0000 mL | INTRAVENOUS | Status: DC | PRN
  Administered 2017-05-24: 10 mL
  Filled 2017-05-24: qty 10

## 2017-05-24 MED ORDER — DEXAMETHASONE SODIUM PHOSPHATE 10 MG/ML IJ SOLN
10.0000 mg | Freq: Once | INTRAMUSCULAR | Status: AC
  Administered 2017-05-24: 10 mg via INTRAVENOUS

## 2017-05-24 MED ORDER — DEXAMETHASONE SODIUM PHOSPHATE 10 MG/ML IJ SOLN
INTRAMUSCULAR | Status: AC
  Filled 2017-05-24: qty 1

## 2017-05-24 NOTE — Patient Instructions (Signed)
[facility] Discharge Instructions for Patients Receiving Chemotherapy  Today you received the following chemotherapy agents:  Kyprolis  To help prevent nausea and vomiting after your treatment, we encourage you to take your nausea medication as prescribed.   If you develop nausea and vomiting that is not controlled by your nausea medication, call the clinic.   BELOW ARE SYMPTOMS THAT SHOULD BE REPORTED IMMEDIATELY:  *FEVER GREATER THAN 100.5 F  *CHILLS WITH OR WITHOUT FEVER  NAUSEA AND VOMITING THAT IS NOT CONTROLLED WITH YOUR NAUSEA MEDICATION  *UNUSUAL SHORTNESS OF BREATH  *UNUSUAL BRUISING OR BLEEDING  TENDERNESS IN MOUTH AND THROAT WITH OR WITHOUT PRESENCE OF ULCERS  *URINARY PROBLEMS  *BOWEL PROBLEMS  UNUSUAL RASH Items with * indicate a potential emergency and should be followed up as soon as possible.  Feel free to call the clinic should you have any questions or concerns. The clinic phone number is [phone].  Please show the CHEMO ALERT CARD at check-in to the Emergency Department and triage nurse.   

## 2017-05-25 LAB — MULTIPLE MYELOMA PANEL, SERUM
ALBUMIN/GLOB SERPL: 0.9 (ref 0.7–1.7)
Albumin SerPl Elph-Mcnc: 2.8 g/dL — ABNORMAL LOW (ref 2.9–4.4)
Alpha 1: 0.2 g/dL (ref 0.0–0.4)
Alpha2 Glob SerPl Elph-Mcnc: 0.5 g/dL (ref 0.4–1.0)
B-Globulin SerPl Elph-Mcnc: 1 g/dL (ref 0.7–1.3)
Gamma Glob SerPl Elph-Mcnc: 1.8 g/dL (ref 0.4–1.8)
Globulin, Total: 3.5 g/dL (ref 2.2–3.9)
IGA: 16 mg/dL — AB (ref 87–352)
IGM (IMMUNOGLOBULIN M), SRM: 15 mg/dL — AB (ref 26–217)
IgG (Immunoglobin G), Serum: 2122 mg/dL — ABNORMAL HIGH (ref 700–1600)
M Protein SerPl Elph-Mcnc: 1.6 g/dL — ABNORMAL HIGH
Total Protein ELP: 6.3 g/dL (ref 6.0–8.5)

## 2017-06-06 ENCOUNTER — Inpatient Hospital Stay: Payer: 59

## 2017-06-06 ENCOUNTER — Inpatient Hospital Stay: Payer: 59 | Admitting: Family

## 2017-06-06 DIAGNOSIS — C9002 Multiple myeloma in relapse: Secondary | ICD-10-CM

## 2017-06-06 DIAGNOSIS — C9000 Multiple myeloma not having achieved remission: Secondary | ICD-10-CM

## 2017-06-06 DIAGNOSIS — Z5112 Encounter for antineoplastic immunotherapy: Secondary | ICD-10-CM | POA: Diagnosis not present

## 2017-06-06 LAB — CBC WITH DIFFERENTIAL (CANCER CENTER ONLY)
BASOS PCT: 0 %
Basophils Absolute: 0 10*3/uL (ref 0.0–0.1)
Eosinophils Absolute: 0.1 10*3/uL (ref 0.0–0.5)
Eosinophils Relative: 3 %
HEMATOCRIT: 26.3 % — AB (ref 34.8–46.6)
Hemoglobin: 8.5 g/dL — ABNORMAL LOW (ref 11.6–15.9)
LYMPHS PCT: 33 %
Lymphs Abs: 0.6 10*3/uL — ABNORMAL LOW (ref 0.9–3.3)
MCH: 29.8 pg (ref 26.0–34.0)
MCHC: 32.3 g/dL (ref 32.0–36.0)
MCV: 92.3 fL (ref 81.0–101.0)
MONO ABS: 0.3 10*3/uL (ref 0.1–0.9)
MONOS PCT: 17 %
NEUTROS ABS: 0.9 10*3/uL — AB (ref 1.5–6.5)
Neutrophils Relative %: 47 %
Platelet Count: 121 10*3/uL — ABNORMAL LOW (ref 145–400)
RBC: 2.85 MIL/uL — ABNORMAL LOW (ref 3.70–5.32)
RDW: 19.7 % — AB (ref 11.1–15.7)
WBC Count: 1.9 10*3/uL — ABNORMAL LOW (ref 3.9–10.3)

## 2017-06-06 LAB — CMP (CANCER CENTER ONLY)
ALT: 17 U/L (ref 0–55)
ANION GAP: 10 (ref 5–15)
AST: 22 U/L (ref 5–34)
Albumin: 3.6 g/dL (ref 3.5–5.0)
Alkaline Phosphatase: 83 U/L (ref 26–84)
BILIRUBIN TOTAL: 0.8 mg/dL (ref 0.2–1.2)
BUN: 14 mg/dL (ref 7–22)
CHLORIDE: 111 mmol/L — AB (ref 98–108)
CO2: 21 mmol/L (ref 18–33)
Calcium: 8.4 mg/dL (ref 8.0–10.3)
Creatinine: 0.9 mg/dL (ref 0.60–1.10)
Glucose, Bld: 92 mg/dL (ref 73–118)
POTASSIUM: 4 mmol/L (ref 3.5–5.1)
Sodium: 142 mmol/L (ref 128–145)
Total Protein: 7 g/dL (ref 6.4–8.1)

## 2017-06-06 MED ORDER — DEXTROSE 5 % IV SOLN: 36.0000 mg/m2 | Freq: Once | INTRAVENOUS | Status: DC

## 2017-06-06 MED ORDER — DEXTROSE 5 % IV SOLN
36.0000 mg/m2 | Freq: Once | INTRAVENOUS | Status: AC
  Administered 2017-06-06: 68 mg via INTRAVENOUS
  Filled 2017-06-06: qty 30

## 2017-06-06 MED ORDER — DEXAMETHASONE SODIUM PHOSPHATE 10 MG/ML IJ SOLN
10.0000 mg | Freq: Once | INTRAMUSCULAR | Status: AC
  Administered 2017-06-06: 10 mg via INTRAVENOUS

## 2017-06-06 MED ORDER — SODIUM CHLORIDE 0.9 % IV SOLN
300.0000 mg/m2 | Freq: Once | INTRAVENOUS | Status: AC
  Administered 2017-06-06: 560 mg via INTRAVENOUS
  Filled 2017-06-06: qty 28

## 2017-06-06 MED ORDER — HEPARIN SOD (PORK) LOCK FLUSH 100 UNIT/ML IV SOLN
500.0000 [IU] | Freq: Once | INTRAVENOUS | Status: AC | PRN
  Administered 2017-06-06: 500 [IU]
  Filled 2017-06-06: qty 5

## 2017-06-06 MED ORDER — PALONOSETRON HCL INJECTION 0.25 MG/5ML
INTRAVENOUS | Status: AC
  Filled 2017-06-06: qty 5

## 2017-06-06 MED ORDER — DEXAMETHASONE SODIUM PHOSPHATE 10 MG/ML IJ SOLN
INTRAMUSCULAR | Status: AC
  Filled 2017-06-06: qty 1

## 2017-06-06 MED ORDER — PALONOSETRON HCL INJECTION 0.25 MG/5ML
0.2500 mg | Freq: Once | INTRAVENOUS | Status: AC
  Administered 2017-06-06: 0.25 mg via INTRAVENOUS

## 2017-06-06 MED ORDER — SODIUM CHLORIDE 0.9 % IV SOLN
Freq: Once | INTRAVENOUS | Status: AC
  Administered 2017-06-06: 11:00:00 via INTRAVENOUS

## 2017-06-06 MED ORDER — SODIUM CHLORIDE 0.9% FLUSH
10.0000 mL | INTRAVENOUS | Status: DC | PRN
  Administered 2017-06-06: 10 mL
  Filled 2017-06-06: qty 10

## 2017-06-06 NOTE — Patient Instructions (Signed)
[facility] Discharge Instructions for Patients Receiving Chemotherapy  Today you received the following chemotherapy agents Cytoxan and Kyprolis.  To help prevent nausea and vomiting after your treatment, we encourage you to take your nausea medication as prescribed by your MD BUT NO ZOFRAN FOR 3 DAYS .   If you develop nausea and vomiting that is not controlled by your nausea medication, call the clinic.   BELOW ARE SYMPTOMS THAT SHOULD BE REPORTED IMMEDIATELY:  *FEVER GREATER THAN 100.5 F  *CHILLS WITH OR WITHOUT FEVER  NAUSEA AND VOMITING THAT IS NOT CONTROLLED WITH YOUR NAUSEA MEDICATION  *UNUSUAL SHORTNESS OF BREATH  *UNUSUAL BRUISING OR BLEEDING  TENDERNESS IN MOUTH AND THROAT WITH OR WITHOUT PRESENCE OF ULCERS  *URINARY PROBLEMS  *BOWEL PROBLEMS  UNUSUAL RASH Items with * indicate a potential emergency and should be followed up as soon as possible.  Feel free to call the clinic should you have any questions or concerns. The clinic phone number is [phone].  Please show the [place] at check-in to the Emergency Department and triage nurse.

## 2017-06-06 NOTE — Progress Notes (Signed)
OK to treat with ANC today per Judson Roch NP

## 2017-06-07 ENCOUNTER — Inpatient Hospital Stay: Payer: 59

## 2017-06-07 DIAGNOSIS — C9000 Multiple myeloma not having achieved remission: Secondary | ICD-10-CM

## 2017-06-07 DIAGNOSIS — Z5112 Encounter for antineoplastic immunotherapy: Secondary | ICD-10-CM | POA: Diagnosis not present

## 2017-06-07 DIAGNOSIS — C9002 Multiple myeloma in relapse: Secondary | ICD-10-CM

## 2017-06-07 LAB — IGG, IGA, IGM
IgA: 20 mg/dL — ABNORMAL LOW (ref 87–352)
IgG (Immunoglobin G), Serum: 1717 mg/dL — ABNORMAL HIGH (ref 700–1600)
IgM (Immunoglobulin M), Srm: 18 mg/dL — ABNORMAL LOW (ref 26–217)

## 2017-06-07 LAB — KAPPA/LAMBDA LIGHT CHAINS
KAPPA, LAMDA LIGHT CHAIN RATIO: 2 — AB (ref 0.26–1.65)
Kappa free light chain: 18.6 mg/L (ref 3.3–19.4)
Lambda free light chains: 9.3 mg/L (ref 5.7–26.3)

## 2017-06-07 MED ORDER — DEXAMETHASONE SODIUM PHOSPHATE 10 MG/ML IJ SOLN
10.0000 mg | Freq: Once | INTRAMUSCULAR | Status: AC
  Administered 2017-06-07: 10 mg via INTRAVENOUS

## 2017-06-07 MED ORDER — SODIUM CHLORIDE 0.9 % IV SOLN: Freq: Once | INTRAVENOUS | Status: DC

## 2017-06-07 MED ORDER — SODIUM CHLORIDE 0.9 % IV SOLN
Freq: Once | INTRAVENOUS | Status: AC
  Administered 2017-06-07: 10:00:00 via INTRAVENOUS

## 2017-06-07 MED ORDER — HEPARIN SOD (PORK) LOCK FLUSH 100 UNIT/ML IV SOLN
500.0000 [IU] | Freq: Once | INTRAVENOUS | Status: AC | PRN
  Administered 2017-06-07: 500 [IU]
  Filled 2017-06-07: qty 5

## 2017-06-07 MED ORDER — SODIUM CHLORIDE 0.9% FLUSH
10.0000 mL | INTRAVENOUS | Status: DC | PRN
  Administered 2017-06-07: 10 mL
  Filled 2017-06-07: qty 10

## 2017-06-07 MED ORDER — DEXTROSE 5 % IV SOLN
36.0000 mg/m2 | Freq: Once | INTRAVENOUS | Status: AC
  Administered 2017-06-07: 68 mg via INTRAVENOUS
  Filled 2017-06-07: qty 30

## 2017-06-07 MED ORDER — DEXAMETHASONE SODIUM PHOSPHATE 10 MG/ML IJ SOLN
INTRAMUSCULAR | Status: AC
  Filled 2017-06-07: qty 1

## 2017-06-07 NOTE — Patient Instructions (Signed)
[facility] Discharge Instructions for Patients Receiving Chemotherapy  Today you received the following chemotherapy agents Kyprolis  To help prevent nausea and vomiting after your treatment, we encourage you to take your nausea medication    If you develop nausea and vomiting that is not controlled by your nausea medication, call the clinic.   BELOW ARE SYMPTOMS THAT SHOULD BE REPORTED IMMEDIATELY: *FEVER GREATER THAN 100.5 F *CHILLS WITH OR WITHOUT FEVER NAUSEA AND VOMITING THAT IS NOT CONTROLLED WITH YOUR NAUSEA MEDICATION *UNUSUAL SHORTNESS OF BREATH *UNUSUAL BRUISING OR BLEEDING TENDERNESS IN MOUTH AND THROAT WITH OR WITHOUT PRESENCE OF ULCERS *URINARY PROBLEMS *BOWEL PROBLEMS UNUSUAL RASH Items with * indicate a potential emergency and should be followed up as soon as possible.  Feel free to call the clinic should you have any questions or concerns. The clinic phone number is [phone].  Please show the CHEMO ALERT CARD at check-in to the Emergency Department and triage nurse.   

## 2017-06-09 LAB — PROTEIN ELECTROPHORESIS, SERUM, WITH REFLEX
A/G RATIO SPE: 1 (ref 0.7–1.7)
Albumin ELP: 3.3 g/dL (ref 2.9–4.4)
Alpha-1-Globulin: 0.2 g/dL (ref 0.0–0.4)
Alpha-2-Globulin: 0.5 g/dL (ref 0.4–1.0)
Beta Globulin: 0.9 g/dL (ref 0.7–1.3)
GLOBULIN, TOTAL: 3.3 g/dL (ref 2.2–3.9)
Gamma Globulin: 1.6 g/dL (ref 0.4–1.8)
M-Spike, %: 1.4 g/dL — ABNORMAL HIGH
SPEP INTERP: 0
Total Protein ELP: 6.6 g/dL (ref 6.0–8.5)

## 2017-06-10 ENCOUNTER — Other Ambulatory Visit: Payer: Self-pay | Admitting: *Deleted

## 2017-06-10 DIAGNOSIS — C9000 Multiple myeloma not having achieved remission: Secondary | ICD-10-CM

## 2017-06-13 ENCOUNTER — Encounter: Payer: Self-pay | Admitting: Hematology & Oncology

## 2017-06-13 ENCOUNTER — Inpatient Hospital Stay: Payer: 59

## 2017-06-13 ENCOUNTER — Inpatient Hospital Stay: Payer: 59 | Attending: Hematology & Oncology

## 2017-06-13 ENCOUNTER — Other Ambulatory Visit: Payer: Self-pay

## 2017-06-13 ENCOUNTER — Inpatient Hospital Stay (HOSPITAL_BASED_OUTPATIENT_CLINIC_OR_DEPARTMENT_OTHER): Payer: 59 | Admitting: Hematology & Oncology

## 2017-06-13 VITALS — BP 117/69 | HR 83 | Temp 98.2°F | Resp 18 | Wt 164.0 lb

## 2017-06-13 DIAGNOSIS — N189 Chronic kidney disease, unspecified: Secondary | ICD-10-CM

## 2017-06-13 DIAGNOSIS — C9000 Multiple myeloma not having achieved remission: Secondary | ICD-10-CM | POA: Diagnosis not present

## 2017-06-13 DIAGNOSIS — Z452 Encounter for adjustment and management of vascular access device: Secondary | ICD-10-CM | POA: Diagnosis not present

## 2017-06-13 DIAGNOSIS — G8929 Other chronic pain: Secondary | ICD-10-CM

## 2017-06-13 DIAGNOSIS — Z5112 Encounter for antineoplastic immunotherapy: Secondary | ICD-10-CM | POA: Diagnosis present

## 2017-06-13 DIAGNOSIS — D631 Anemia in chronic kidney disease: Secondary | ICD-10-CM | POA: Diagnosis not present

## 2017-06-13 DIAGNOSIS — Z5111 Encounter for antineoplastic chemotherapy: Secondary | ICD-10-CM | POA: Diagnosis present

## 2017-06-13 DIAGNOSIS — C9002 Multiple myeloma in relapse: Secondary | ICD-10-CM

## 2017-06-13 DIAGNOSIS — M545 Low back pain: Secondary | ICD-10-CM

## 2017-06-13 LAB — CMP (CANCER CENTER ONLY)
ALT: 19 U/L (ref 0–55)
ANION GAP: 7 (ref 5–15)
AST: 22 U/L (ref 5–34)
Albumin: 3.6 g/dL (ref 3.5–5.0)
Alkaline Phosphatase: 78 U/L (ref 26–84)
BUN: 18 mg/dL (ref 7–22)
CALCIUM: 8.4 mg/dL (ref 8.0–10.3)
CO2: 22 mmol/L (ref 18–33)
Chloride: 113 mmol/L — ABNORMAL HIGH (ref 98–108)
Creatinine: 1 mg/dL (ref 0.60–1.10)
GLUCOSE: 85 mg/dL (ref 73–118)
POTASSIUM: 3.9 mmol/L (ref 3.3–4.7)
Sodium: 142 mmol/L (ref 128–145)
Total Bilirubin: 0.9 mg/dL (ref 0.2–1.2)
Total Protein: 6.7 g/dL (ref 6.4–8.1)

## 2017-06-13 LAB — CBC WITH DIFFERENTIAL (CANCER CENTER ONLY)
BASOS ABS: 0 10*3/uL (ref 0.0–0.1)
BASOS PCT: 1 %
EOS ABS: 0.1 10*3/uL (ref 0.0–0.5)
EOS PCT: 4 %
HCT: 25.3 % — ABNORMAL LOW (ref 34.8–46.6)
Hemoglobin: 8.3 g/dL — ABNORMAL LOW (ref 11.6–15.9)
LYMPHS PCT: 20 %
Lymphs Abs: 0.4 10*3/uL — ABNORMAL LOW (ref 0.9–3.3)
MCH: 30.3 pg (ref 26.0–34.0)
MCHC: 32.8 g/dL (ref 32.0–36.0)
MCV: 92.3 fL (ref 81.0–101.0)
MONO ABS: 0.4 10*3/uL (ref 0.1–0.9)
Monocytes Relative: 21 %
Neutro Abs: 1 10*3/uL — ABNORMAL LOW (ref 1.5–6.5)
Neutrophils Relative %: 54 %
PLATELETS: 45 10*3/uL — AB (ref 145–400)
RBC: 2.74 MIL/uL — ABNORMAL LOW (ref 3.70–5.32)
RDW: 18.9 % — AB (ref 11.1–15.7)
WBC Count: 1.9 10*3/uL — ABNORMAL LOW (ref 3.9–10.0)

## 2017-06-13 LAB — SAMPLE TO BLOOD BANK

## 2017-06-13 MED ORDER — SODIUM CHLORIDE 0.9% FLUSH
10.0000 mL | INTRAVENOUS | Status: DC | PRN
  Administered 2017-06-13: 10 mL
  Filled 2017-06-13: qty 10

## 2017-06-13 MED ORDER — PALONOSETRON HCL INJECTION 0.25 MG/5ML
INTRAVENOUS | Status: AC
  Filled 2017-06-13: qty 5

## 2017-06-13 MED ORDER — HEPARIN SOD (PORK) LOCK FLUSH 100 UNIT/ML IV SOLN
500.0000 [IU] | Freq: Once | INTRAVENOUS | Status: AC | PRN
  Administered 2017-06-13: 500 [IU]
  Filled 2017-06-13: qty 5

## 2017-06-13 NOTE — Progress Notes (Signed)
Hematology and Oncology Follow Up Visit  Barbara Cook [id number] 12/15/1955 62 y.o. 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  She is doing fairly well.  She has some chronic back issues.  We have looked at this before.  I know that she has some wear and tear in the back.  Her myeloma levels have responded incredibly well.  Her M spike is now down to 1.4 g/dL.  Her IgG level is 1700 mg/dL.  Her blood counts are just a little bit too low today to be treated however.  Her erythropoietin level is only 14.  As such, we may have to give her Aranesp.  I talked to her about this.  I explained why I thought this would be helpful.  I would prefer not to just transfuse her.  I do not see a contraindication to her having Aranesp.  She agrees to try this.  Her appetite is doing okay.  She has had no problems with diarrhea.  She has had no nausea or vomiting.  Her leg swelling seems to be doing better.  Currently, her performance status is ECOG 1.  Medications:  Allergies as of 06/13/2017      Reactions   Codeine Palpitations      Medication List        Accurate as of 06/13/17  1:06 PM. Always use your most recent med list.          acetaminophen 500 MG tablet Commonly known as:  TYLENOL Take 500 mg by mouth every 6 (six) hours as needed for mild pain or headache.   acyclovir 400 MG tablet Commonly known as:  ZOVIRAX Take 1 tablet (400 mg total) by mouth daily.   benzonatate 100 MG capsule Commonly known as:  TESSALON PERLES Take 2 capsules (200 mg total) 3 (three) times daily as needed by mouth for cough.   diazepam 2 MG tablet Commonly known as:  VALIUM Take 2 tablets (4 mg total) by mouth every 8 (eight) hours as needed for anxiety.   hyoscyamine 0.125 MG SL tablet Commonly known as:  LEVSIN SL Place 1 tablet (0.125 mg total) under the tongue every 4 (four) hours as needed.   magnesium oxide 400 (241.3 Mg) MG tablet Commonly known as:  MAG-OX TAKE 1 TABLET BY MOUTH TWICE A DAY   multivitamin tablet Take 1 tablet by mouth every evening.   ondansetron 8 MG tablet Commonly known as:  ZOFRAN TAKE 1 TABLET BY MOUTH TWICE A DAY FOR NAUSEA AND VOMITING 1 TABLET 1 HOUR PRIOR TO CHEMOTHERAPY   pantoprazole 40 MG tablet Commonly known as:  PROTONIX Take 1 tablet (40 mg total) by mouth daily.   polyethylene glycol powder powder Commonly known as:  GLYCOLAX/MIRALAX 1 capful daily as needed   prochlorperazine 10 MG tablet Commonly known as:  COMPAZINE Take 1 tablet (10 mg total) by mouth every 6 (six) hours as needed for nausea or vomiting.   torsemide 20 MG tablet Commonly known as:  DEMADEX Take 1 tablet (20 mg total) by mouth daily as needed.   traMADol 50 MG tablet Commonly  known as:  ULTRAM Take 1 tablet (50 mg total) every 6 (six) hours as needed by mouth.   vitamin B-6 250 MG tablet Take 1 tablet (250 mg total) by mouth daily.   Vitamin D (Ergocalciferol) 50000 units Caps capsule Commonly known as:  DRISDOL TAKE 1 CAPSULE (50,000 UNITS TOTAL) BY MOUTH ONCE A WEEK.       Allergies:  Allergies  Allergen Reactions  . Codeine Palpitations    Past Medical History, Surgical history, Social history, and Family History were reviewed and updated.  Review of Systems: Review of Systems  Constitutional: Negative.   HENT: Negative.   Eyes: Negative.   Respiratory: Negative.   Cardiovascular: Negative.   Gastrointestinal: Negative.   Genitourinary: Negative.   Musculoskeletal: Negative.   Skin: Negative.   Neurological: Negative.   Endo/Heme/Allergies: Negative.    Psychiatric/Behavioral: Negative.     Physical Exam:  weight is 164 lb (74.4 kg). Her oral temperature is 98.2 F (36.8 C). Her blood pressure is 117/69 and her pulse is 83. Her respiration is 18 and oxygen saturation is 98%.   Wt Readings from Last 3 Encounters:  06/13/17 164 lb (74.4 kg)  06/06/17 162 lb 8 oz (73.7 kg)  05/23/17 169 lb 4 oz (76.8 kg)    Physical Exam  Constitutional: She is oriented to person, place, and time.  HENT:  Head: Normocephalic and atraumatic.  Mouth/Throat: Oropharynx is clear and moist.  Eyes: EOM are normal. Pupils are equal, round, and reactive to light.  Neck: Normal range of motion.  Cardiovascular: Normal rate, regular rhythm and normal heart sounds.  Pulmonary/Chest: Effort normal and breath sounds normal.  Abdominal: Soft. Bowel sounds are normal.  Musculoskeletal: Normal range of motion. She exhibits no edema, tenderness or deformity.  Lymphadenopathy:    She has no cervical adenopathy.  Neurological: She is alert and oriented to person, place, and time.  Skin: Skin is warm and dry. No rash noted. No erythema.  Psychiatric: She has a normal mood and affect. Her behavior is normal. Judgment and thought content normal.  Vitals reviewed.         Lab Results  Component Value Date   WBC 1.9 (L) 06/13/2017   HGB 10.0 (L) 05/09/2017   HCT 25.3 (L) 06/13/2017   MCV 92.3 06/13/2017   PLT 45 (L) 06/13/2017   Lab Results  Component Value Date   FERRITIN 183 03/19/2017   IRON 51 03/19/2017   TIBC 204 (L) 03/19/2017   UIBC 153 03/19/2017   IRONPCTSAT 25 03/19/2017   Lab Results  Component Value Date   RETICCTPCT 0.6 03/19/2017   RBC 2.74 (L) 06/13/2017   RETICCTABS 35.9 12/25/2014   Lab Results  Component Value Date   KPAFRELGTCHN 18.6 06/06/2017   LAMBDASER 9.3 06/06/2017   KAPLAMBRATIO 2.00 (H) 06/06/2017   Lab Results  Component Value Date   IGGSERUM 1,717 (H) 06/06/2017   IGA 20 (L) 06/06/2017   IGMSERUM 18 (L)  06/06/2017   Lab Results  Component Value Date   TOTALPROTELP 6.6 06/06/2017   ALBUMINELP 3.3 06/06/2017   A1GS 0.2 06/06/2017   A2GS 0.5 06/06/2017   BETS 0.9 06/06/2017   BETA2SER 0.3 05/07/2015   GAMS 1.6 06/06/2017   MSPIKE 1.4 (H) 06/06/2017   SPEI * 05/07/2015     Chemistry      Component Value Date/Time   NA 142 06/13/2017 0850   NA 143 05/09/2017 0812   NA 139 02/20/2016 1207   K 3.9 06/13/2017  0850   K 4.0 05/09/2017 0812   K 3.8 02/20/2016 1207   CL 113 (H) 06/13/2017 0850   CL 108 05/09/2017 0812   CO2 22 06/13/2017 0850   CO2 26 05/09/2017 0812   CO2 29 02/20/2016 1207   BUN 18 06/13/2017 0850   BUN 13 05/09/2017 0812   BUN 22.3 02/20/2016 1207   CREATININE 0.9 05/09/2017 0812   CREATININE 1.3 (H) 02/20/2016 1207      Component Value Date/Time   CALCIUM 8.4 06/13/2017 0850   CALCIUM 8.3 05/09/2017 0812   CALCIUM 9.7 02/20/2016 1207   ALKPHOS 78 06/13/2017 0850   ALKPHOS 123 (H) 05/09/2017 0812   ALKPHOS 62 02/20/2016 1207   AST 22 06/13/2017 0850   AST 27 02/20/2016 1207   ALT 19 06/13/2017 0850   ALT 26 05/09/2017 0812   ALT 19 02/20/2016 1207   BILITOT 0.9 06/13/2017 0850   BILITOT 0.44 02/20/2016 1207      Impression and Plan: Ms. Gierke is a very pleasant 62 yo African American female with IgG Kappa myeloma.   Again, we will have her take the week off.  I do not think this would be a bad idea for her.  I will actually have her come back next week.  I will then plan to give her 2 weeks off.  We will see about her hemoglobin when she comes back.  I will plan to see her back in a month.  Again, she is done very well.  I do not think it would be a problem trying to have her take a break.  I will decrease the dose of Cytoxan.  I think this would also be helpful for her blood counts.        [name], MD 2/4/20191:06 PM

## 2017-06-13 NOTE — Addendum Note (Signed)
Addended by: Randolm Idol on: 06/13/2017 11:18 AM   Modules accepted: Orders

## 2017-06-14 ENCOUNTER — Ambulatory Visit: Payer: 59

## 2017-06-15 ENCOUNTER — Telehealth: Payer: Self-pay | Admitting: Hematology & Oncology

## 2017-06-15 NOTE — Telephone Encounter (Signed)
Faxed medical records to: [facility] for Barbara Cook 03-29-1956 Id: [id number] Claim: [id number] Policy: [id number]

## 2017-06-20 ENCOUNTER — Inpatient Hospital Stay: Payer: 59

## 2017-06-20 DIAGNOSIS — C9002 Multiple myeloma in relapse: Secondary | ICD-10-CM

## 2017-06-20 DIAGNOSIS — C9000 Multiple myeloma not having achieved remission: Secondary | ICD-10-CM

## 2017-06-20 DIAGNOSIS — N189 Chronic kidney disease, unspecified: Secondary | ICD-10-CM | POA: Diagnosis not present

## 2017-06-20 LAB — CBC WITH DIFFERENTIAL (CANCER CENTER ONLY)
Basophils Absolute: 0 10*3/uL (ref 0.0–0.1)
Basophils Relative: 1 %
Eosinophils Absolute: 0.1 10*3/uL (ref 0.0–0.5)
Eosinophils Relative: 3 %
HEMATOCRIT: 26 % — AB (ref 34.8–46.6)
HEMOGLOBIN: 8.6 g/dL — AB (ref 11.6–15.9)
LYMPHS ABS: 0.4 10*3/uL — AB (ref 0.9–3.3)
Lymphocytes Relative: 24 %
MCH: 31.5 pg (ref 26.0–34.0)
MCHC: 33.1 g/dL (ref 32.0–36.0)
MCV: 95.2 fL (ref 81.0–101.0)
MONO ABS: 0.4 10*3/uL (ref 0.1–0.9)
MONOS PCT: 20 %
NEUTROS ABS: 0.9 10*3/uL — AB (ref 1.5–6.5)
NEUTROS PCT: 52 %
Platelet Count: 101 10*3/uL — ABNORMAL LOW (ref 145–400)
RBC: 2.73 MIL/uL — ABNORMAL LOW (ref 3.70–5.32)
RDW: 18.2 % — ABNORMAL HIGH (ref 11.1–15.7)
WBC Count: 1.8 10*3/uL — ABNORMAL LOW (ref 3.9–10.0)

## 2017-06-20 LAB — CMP (CANCER CENTER ONLY)
ALT: 17 U/L (ref 0–55)
ANION GAP: 10 (ref 5–15)
AST: 24 U/L (ref 5–34)
Albumin: 3.7 g/dL (ref 3.5–5.0)
Alkaline Phosphatase: 85 U/L — ABNORMAL HIGH (ref 26–84)
BILIRUBIN TOTAL: 0.9 mg/dL (ref 0.2–1.2)
BUN: 17 mg/dL (ref 7–22)
CHLORIDE: 111 mmol/L — AB (ref 98–108)
CO2: 23 mmol/L (ref 18–33)
Calcium: 8.2 mg/dL (ref 8.0–10.3)
Creatinine: 0.7 mg/dL (ref 0.60–1.10)
GLUCOSE: 95 mg/dL (ref 73–118)
Potassium: 3.7 mmol/L (ref 3.3–4.7)
Sodium: 144 mmol/L (ref 128–145)
Total Protein: 6.9 g/dL (ref 6.4–8.1)

## 2017-06-20 MED ORDER — SODIUM CHLORIDE 0.9% FLUSH
10.0000 mL | INTRAVENOUS | Status: DC | PRN
  Administered 2017-06-20: 10 mL
  Filled 2017-06-20: qty 10

## 2017-06-20 MED ORDER — CARFILZOMIB CHEMO INJECTION 60 MG: 36.0000 mg/m2 | Freq: Once | INTRAVENOUS | Status: DC

## 2017-06-20 MED ORDER — SODIUM CHLORIDE 0.9 % IV SOLN: 225.0000 mg/m2 | Freq: Once | INTRAVENOUS | Status: DC

## 2017-06-20 MED ORDER — PALONOSETRON HCL INJECTION 0.25 MG/5ML: 0.2500 mg | Freq: Once | INTRAVENOUS | Status: DC

## 2017-06-20 MED ORDER — SODIUM CHLORIDE 0.9 % IV SOLN: Freq: Once | INTRAVENOUS | Status: DC

## 2017-06-20 MED ORDER — HEPARIN SOD (PORK) LOCK FLUSH 100 UNIT/ML IV SOLN
500.0000 [IU] | Freq: Once | INTRAVENOUS | Status: AC | PRN
  Administered 2017-06-20: 500 [IU]
  Filled 2017-06-20: qty 5

## 2017-06-20 MED ORDER — DEXAMETHASONE SODIUM PHOSPHATE 10 MG/ML IJ SOLN: 10.0000 mg | Freq: Once | INTRAMUSCULAR | Status: DC

## 2017-06-20 NOTE — Progress Notes (Signed)
NOT treating today - pt will resume chemo with labs and next appt 3/11 per MD ENNEVER

## 2017-06-21 ENCOUNTER — Ambulatory Visit: Payer: 59

## 2017-06-30 ENCOUNTER — Telehealth: Payer: Self-pay | Admitting: Hematology & Oncology

## 2017-06-30 NOTE — Telephone Encounter (Signed)
Faxed medical records to: [facility]  ID: [id number] CLAIM: [id number] POLICY: [id number] 90/07/[birth year] to present F: [phone]     COPY SCANNED

## 2017-07-18 ENCOUNTER — Encounter: Payer: Self-pay | Admitting: Pharmacist

## 2017-07-18 ENCOUNTER — Inpatient Hospital Stay: Payer: 59 | Attending: Hematology & Oncology | Admitting: Hematology & Oncology

## 2017-07-18 ENCOUNTER — Inpatient Hospital Stay: Payer: 59

## 2017-07-18 ENCOUNTER — Other Ambulatory Visit: Payer: Self-pay

## 2017-07-18 ENCOUNTER — Encounter: Payer: Self-pay | Admitting: Hematology & Oncology

## 2017-07-18 DIAGNOSIS — C9000 Multiple myeloma not having achieved remission: Secondary | ICD-10-CM

## 2017-07-18 DIAGNOSIS — D631 Anemia in chronic kidney disease: Secondary | ICD-10-CM | POA: Diagnosis not present

## 2017-07-18 DIAGNOSIS — Z5112 Encounter for antineoplastic immunotherapy: Secondary | ICD-10-CM | POA: Insufficient documentation

## 2017-07-18 DIAGNOSIS — N189 Chronic kidney disease, unspecified: Secondary | ICD-10-CM | POA: Diagnosis not present

## 2017-07-18 DIAGNOSIS — C9002 Multiple myeloma in relapse: Secondary | ICD-10-CM

## 2017-07-18 DIAGNOSIS — Z5111 Encounter for antineoplastic chemotherapy: Secondary | ICD-10-CM | POA: Diagnosis not present

## 2017-07-18 LAB — RETICULOCYTES
RBC.: 3.12 MIL/uL — AB (ref 3.70–5.45)
RETIC COUNT ABSOLUTE: 34.3 10*3/uL (ref 33.7–90.7)
RETIC CT PCT: 1.1 % (ref 0.7–2.1)

## 2017-07-18 LAB — CMP (CANCER CENTER ONLY)
ALBUMIN: 4.1 g/dL (ref 3.5–5.0)
ALT: 22 U/L (ref 10–47)
AST: 25 U/L (ref 11–38)
Alkaline Phosphatase: 57 U/L (ref 26–84)
Anion gap: 7 (ref 5–15)
BUN: 18 mg/dL (ref 7–22)
CHLORIDE: 109 mmol/L — AB (ref 98–108)
CO2: 24 mmol/L (ref 18–33)
CREATININE: 0.8 mg/dL (ref 0.60–1.20)
Calcium: 8.9 mg/dL (ref 8.0–10.3)
GLUCOSE: 87 mg/dL (ref 73–118)
Potassium: 4.2 mmol/L (ref 3.3–4.7)
SODIUM: 140 mmol/L (ref 128–145)
Total Bilirubin: 0.7 mg/dL (ref 0.2–1.6)
Total Protein: 7.2 g/dL (ref 6.4–8.1)

## 2017-07-18 LAB — IRON AND TIBC
Iron: 52 ug/dL (ref 41–142)
Saturation Ratios: 20 % — ABNORMAL LOW (ref 21–57)
TIBC: 268 ug/dL (ref 236–444)
UIBC: 215 ug/dL

## 2017-07-18 LAB — CBC WITH DIFFERENTIAL (CANCER CENTER ONLY)
Basophils Absolute: 0 10*3/uL (ref 0.0–0.1)
Basophils Relative: 0 %
EOS ABS: 0.1 10*3/uL (ref 0.0–0.5)
EOS PCT: 5 %
HCT: 29.6 % — ABNORMAL LOW (ref 34.8–46.6)
Hemoglobin: 9.7 g/dL — ABNORMAL LOW (ref 11.6–15.9)
LYMPHS ABS: 0.4 10*3/uL — AB (ref 0.9–3.3)
Lymphocytes Relative: 26 %
MCH: 31.9 pg (ref 26.0–34.0)
MCHC: 32.8 g/dL (ref 32.0–36.0)
MCV: 97.4 fL (ref 81.0–101.0)
MONO ABS: 0.2 10*3/uL (ref 0.1–0.9)
MONOS PCT: 12 %
Neutro Abs: 1 10*3/uL — ABNORMAL LOW (ref 1.5–6.5)
Neutrophils Relative %: 57 %
PLATELETS: 141 10*3/uL — AB (ref 145–400)
RBC: 3.04 MIL/uL — ABNORMAL LOW (ref 3.70–5.32)
RDW: 12.3 % (ref 11.1–15.7)
WBC Count: 1.7 10*3/uL — ABNORMAL LOW (ref 3.9–10.0)

## 2017-07-18 LAB — FERRITIN: Ferritin: 557 ng/mL — ABNORMAL HIGH (ref 9–269)

## 2017-07-18 MED ORDER — SODIUM CHLORIDE 0.9 % IV SOLN
180.0000 mg/m2 | Freq: Once | INTRAVENOUS | Status: AC
  Administered 2017-07-18: 340 mg via INTRAVENOUS
  Filled 2017-07-18: qty 17

## 2017-07-18 MED ORDER — SODIUM CHLORIDE 0.9% FLUSH
10.0000 mL | INTRAVENOUS | Status: DC | PRN
  Administered 2017-07-18: 10 mL
  Filled 2017-07-18: qty 10

## 2017-07-18 MED ORDER — SODIUM CHLORIDE 0.9 % IV SOLN
Freq: Once | INTRAVENOUS | Status: AC
  Administered 2017-07-18: 10:00:00 via INTRAVENOUS

## 2017-07-18 MED ORDER — DEXTROSE 5 % IV SOLN
36.0000 mg/m2 | Freq: Once | INTRAVENOUS | Status: AC
  Administered 2017-07-18: 68 mg via INTRAVENOUS
  Filled 2017-07-18: qty 4

## 2017-07-18 MED ORDER — DEXAMETHASONE SODIUM PHOSPHATE 10 MG/ML IJ SOLN
INTRAMUSCULAR | Status: AC
  Filled 2017-07-18: qty 1

## 2017-07-18 MED ORDER — HEPARIN SOD (PORK) LOCK FLUSH 100 UNIT/ML IV SOLN
500.0000 [IU] | Freq: Once | INTRAVENOUS | Status: AC | PRN
  Administered 2017-07-18: 500 [IU]
  Filled 2017-07-18: qty 5

## 2017-07-18 MED ORDER — DEXAMETHASONE SODIUM PHOSPHATE 10 MG/ML IJ SOLN
10.0000 mg | Freq: Once | INTRAMUSCULAR | Status: AC
  Administered 2017-07-18: 10 mg via INTRAVENOUS

## 2017-07-18 MED ORDER — PALONOSETRON HCL INJECTION 0.25 MG/5ML
INTRAVENOUS | Status: AC
  Filled 2017-07-18: qty 5

## 2017-07-18 MED ORDER — PALONOSETRON HCL INJECTION 0.25 MG/5ML
0.2500 mg | Freq: Once | INTRAVENOUS | Status: AC
  Administered 2017-07-18: 0.25 mg via INTRAVENOUS

## 2017-07-18 MED ORDER — SODIUM CHLORIDE 0.9 % IV SOLN: Freq: Once | INTRAVENOUS | Status: DC

## 2017-07-18 NOTE — Progress Notes (Signed)
Hematology and Oncology Follow Up Visit  Barbara Cook [id number] 08/21/1955 62 y.o. 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  She is doing fairly well.  She has some chronic back issues.  We have looked at this before.  I know that she has some wear and tear in the back.  Her myeloma levels have responded incredibly well.  Her M spike is now down to 1.4 g/dL.  Her IgG level is 1700 mg/dL.  Her blood counts are just a little bit too low today to be treated however.  Her erythropoietin level is only 14.  As such, we may have to give her Aranesp.  I talked to her about this.  I explained why I thought this would be helpful.  I would prefer not to just transfuse her.  I do not see a contraindication to her having Aranesp.  She agrees to try this.  Her appetite is doing okay.  She has had no problems with diarrhea.  She has had no nausea or vomiting.  Her leg swelling seems to be doing better.  Currently, her performance status is ECOG 1.  Medications:  Allergies as of 07/18/2017      Reactions   Codeine Palpitations      Medication List        Accurate as of 07/18/17  9:17 AM. Always use your most recent med list.          acetaminophen 500 MG tablet Commonly known as:  TYLENOL Take 500 mg by mouth every 6 (six) hours as needed for mild pain or headache.   acyclovir 400 MG tablet Commonly known as:  ZOVIRAX Take 1 tablet (400 mg total) by mouth daily.   benzonatate 100 MG capsule Commonly known as:  TESSALON PERLES Take 2 capsules (200 mg total) 3 (three) times daily as needed by mouth for  cough.   diazepam 2 MG tablet Commonly known as:  VALIUM Take 2 tablets (4 mg total) by mouth every 8 (eight) hours as needed for anxiety.   hyoscyamine 0.125 MG SL tablet Commonly known as:  LEVSIN SL Place 1 tablet (0.125 mg total) under the tongue every 4 (four) hours as needed.   magnesium oxide 400 (241.3 Mg) MG tablet Commonly known as:  MAG-OX TAKE 1 TABLET BY MOUTH TWICE A DAY   multivitamin tablet Take 1 tablet by mouth every evening.   ondansetron 8 MG tablet Commonly known as:  ZOFRAN TAKE 1 TABLET BY MOUTH TWICE A DAY FOR NAUSEA AND VOMITING 1 TABLET 1 HOUR PRIOR TO CHEMOTHERAPY   pantoprazole 40 MG tablet Commonly known as:  PROTONIX Take 1 tablet (40 mg total) by mouth daily.   polyethylene glycol powder powder Commonly known as:  GLYCOLAX/MIRALAX 1 capful daily as needed   prochlorperazine 10 MG tablet Commonly known as:  COMPAZINE Take 1 tablet (10 mg total) by mouth every 6 (six) hours as needed for nausea or vomiting.   torsemide 20 MG tablet Commonly known as:  DEMADEX Take 1 tablet (20 mg total) by mouth daily as needed.   traMADol 50 MG tablet  Commonly known as:  ULTRAM Take 1 tablet (50 mg total) every 6 (six) hours as needed by mouth.   vitamin B-6 250 MG tablet Take 1 tablet (250 mg total) by mouth daily.   Vitamin D (Ergocalciferol) 50000 units Caps capsule Commonly known as:  DRISDOL TAKE 1 CAPSULE (50,000 UNITS TOTAL) BY MOUTH ONCE A WEEK.       Allergies:  Allergies  Allergen Reactions  . Codeine Palpitations    Past Medical History, Surgical history, Social history, and Family History were reviewed and updated.  Review of Systems: Review of Systems  Constitutional: Negative.   HENT: Negative.   Eyes: Negative.   Respiratory: Negative.   Cardiovascular: Negative.   Gastrointestinal: Negative.   Genitourinary: Negative.   Musculoskeletal: Negative.   Skin: Negative.   Neurological: Negative.   Endo/Heme/Allergies:  Negative.   Psychiatric/Behavioral: Negative.     Physical Exam:  vitals were not taken for this visit.   Wt Readings from Last 3 Encounters:  07/18/17 165 lb 0.3 oz (74.9 kg)  06/13/17 164 lb (74.4 kg)  06/06/17 162 lb 8 oz (73.7 kg)    Physical Exam  Constitutional: She is oriented to person, place, and time.  HENT:  Head: Normocephalic and atraumatic.  Mouth/Throat: Oropharynx is clear and moist.  Eyes: EOM are normal. Pupils are equal, round, and reactive to light.  Neck: Normal range of motion.  Cardiovascular: Normal rate, regular rhythm and normal heart sounds.  Pulmonary/Chest: Effort normal and breath sounds normal.  Abdominal: Soft. Bowel sounds are normal.  Musculoskeletal: Normal range of motion. She exhibits no edema, tenderness or deformity.  Lymphadenopathy:    She has no cervical adenopathy.  Neurological: She is alert and oriented to person, place, and time.  Skin: Skin is warm and dry. No rash noted. No erythema.  Psychiatric: She has a normal mood and affect. Her behavior is normal. Judgment and thought content normal.  Vitals reviewed.         Lab Results  Component Value Date   WBC 1.8 (L) 06/20/2017   HGB 10.0 (L) 05/09/2017   HCT 26.0 (L) 06/20/2017   MCV 95.2 06/20/2017   PLT 101 (L) 06/20/2017   Lab Results  Component Value Date   FERRITIN 183 03/19/2017   IRON 51 03/19/2017   TIBC 204 (L) 03/19/2017   UIBC 153 03/19/2017   IRONPCTSAT 25 03/19/2017   Lab Results  Component Value Date   RETICCTPCT 0.6 03/19/2017   RBC 2.73 (L) 06/20/2017   RETICCTABS 35.9 12/25/2014   Lab Results  Component Value Date   KPAFRELGTCHN 18.6 06/06/2017   LAMBDASER 9.3 06/06/2017   KAPLAMBRATIO 2.00 (H) 06/06/2017   Lab Results  Component Value Date   IGGSERUM 1,717 (H) 06/06/2017   IGA 20 (L) 06/06/2017   IGMSERUM 18 (L) 06/06/2017   Lab Results  Component Value Date   TOTALPROTELP 6.6 06/06/2017   ALBUMINELP 3.3 06/06/2017   A1GS 0.2  06/06/2017   A2GS 0.5 06/06/2017   BETS 0.9 06/06/2017   BETA2SER 0.3 05/07/2015   GAMS 1.6 06/06/2017   MSPIKE 1.4 (H) 06/06/2017   SPEI * 05/07/2015     Chemistry      Component Value Date/Time   NA 144 06/20/2017 0905   NA 143 05/09/2017 0812   NA 139 02/20/2016 1207   K 3.7 06/20/2017 0905   K 4.0 05/09/2017 0812   K 3.8 02/20/2016 1207   CL 111 (H) 06/20/2017 0905   CL 108 05/09/2017  0812   CO2 23 06/20/2017 0905   CO2 26 05/09/2017 0812   CO2 29 02/20/2016 1207   BUN 17 06/20/2017 0905   BUN 13 05/09/2017 0812   BUN 22.3 02/20/2016 1207   CREATININE 0.70 06/20/2017 0905   CREATININE 0.9 05/09/2017 0812   CREATININE 1.3 (H) 02/20/2016 1207      Component Value Date/Time   CALCIUM 8.2 06/20/2017 0905   CALCIUM 8.3 05/09/2017 0812   CALCIUM 9.7 02/20/2016 1207   ALKPHOS 85 (H) 06/20/2017 0905   ALKPHOS 123 (H) 05/09/2017 0812   ALKPHOS 62 02/20/2016 1207   AST 24 06/20/2017 0905   AST 27 02/20/2016 1207   ALT 17 06/20/2017 0905   ALT 26 05/09/2017 0812   ALT 19 02/20/2016 1207   BILITOT 0.9 06/20/2017 0905   BILITOT 0.44 02/20/2016 1207      Impression and Plan: Ms. Hossain is a very pleasant 62 yo African American female with IgG Kappa myeloma.   I am going to go ahead and treat her.  Her white cell count is borderline.  I am going to keep readjusting her dose of Cytoxan.  Hopefully, she will respond.  I just feel bad about the issues she is having with the disability.  I am very disappointed that the disability folks or using her ECOG status to determine that she is eligible for.  I use the ECOG status to see if she is  eligible for chemotherapy.  We will plan to get her back in another couple weeks or so.  [name], MD 3/11/20199:17 AM

## 2017-07-18 NOTE — Patient Instructions (Signed)
Carfilzomib injection What is this medicine? CARFILZOMIB (kar FILZ oh mib) targets a specific protein within cancer cells and stops the cancer cells from growing. It is used to treat multiple myeloma. This medicine may be used for other purposes; ask your health care provider or pharmacist if you have questions. COMMON BRAND NAME(S): KYPROLIS What should I tell my health care provider before I take this medicine? They need to know if you have any of these conditions: -heart disease -history of blood clots -irregular heartbeat -kidney disease -liver disease -lung or breathing disease -an unusual or allergic reaction to carfilzomib, or other medicines, foods, dyes, or preservatives -pregnant or trying to get pregnant -breast-feeding How should I use this medicine? This medicine is for injection or infusion into a vein. It is given by a health care professional in a hospital or clinic setting. Talk to your pediatrician regarding the use of this medicine in children. Special care may be needed. Overdosage: If you think you have taken too much of this medicine contact a poison control center or emergency room at once. NOTE: This medicine is only for you. Do not share this medicine with others. What if I miss a dose? It is important not to miss your dose. Call your doctor or health care professional if you are unable to keep an appointment. What may interact with this medicine? Interactions are not expected. Give your health care provider a list of all the medicines, herbs, non-prescription drugs, or dietary supplements you use. Also tell them if you smoke, drink alcohol, or use illegal drugs. Some items may interact with your medicine. This list may not describe all possible interactions. Give your health care provider a list of all the medicines, herbs, non-prescription drugs, or dietary supplements you use. Also tell them if you smoke, drink alcohol, or use illegal drugs. Some items may  interact with your medicine. What should I watch for while using this medicine? Your condition will be monitored carefully while you are receiving this medicine. Report any side effects. Continue your course of treatment even though you feel ill unless your doctor tells you to stop. You may need blood work done while you are taking this medicine. Do not become pregnant while taking this medicine or for at least 30 days after stopping it. Women should inform their doctor if they wish to become pregnant or think they might be pregnant. There is a potential for serious side effects to an unborn child. Men should not father a child while taking this medicine and for 90 days after stopping it. Talk to your health care professional or pharmacist for more information. Do not breast-feed an infant while taking this medicine. Check with your doctor or health care professional if you get an attack of severe diarrhea, nausea and vomiting, or if you sweat a lot. The loss of too much body fluid can make it dangerous for you to take this medicine. You may get dizzy. Do not drive, use machinery, or do anything that needs mental alertness until you know how this medicine affects you. Do not stand or sit up quickly, especially if you are an older patient. This reduces the risk of dizzy or fainting spells. What side effects may I notice from receiving this medicine? Side effects that you should report to your doctor or health care professional as soon as possible: -allergic reactions like skin rash, itching or hives, swelling of the face, lips, or tongue -confusion -dizziness -feeling faint or lightheaded -fever or chills -  palpitations -seizures -signs and symptoms of bleeding such as bloody or black, tarry stools; red or dark-brown urine; spitting up blood or brown material that looks like coffee grounds; red spots on the skin; unusual bruising or bleeding including from the eye, gums, or nose -signs and symptoms of  a blood clot such as breathing problems; changes in vision; chest pain; severe, sudden headache; pain, swelling, warmth in the leg; trouble speaking; sudden numbness or weakness of the face, arm or leg -signs and symptoms of kidney injury like trouble passing urine or change in the amount of urine -signs and symptoms of liver injury like dark yellow or brown urine; general ill feeling or flu-like symptoms; light-colored stools; loss of appetite; nausea; right upper belly pain; unusually weak or tired; yellowing of the eyes or skin Side effects that usually do not require medical attention (report to your doctor or health care professional if they continue or are bothersome): -back pain -cough -diarrhea -headache -muscle cramps -vomiting This list may not describe all possible side effects. Call your doctor for medical advice about side effects. You may report side effects to FDA at 1-800-FDA-1088. Where should I keep my medicine? This drug is given in a hospital or clinic and will not be stored at home. NOTE: This sheet is a summary. It may not cover all possible information. If you have questions about this medicine, talk to your doctor, pharmacist, or health care provider.  2018 Elsevier/Gold Standard (2015-05-29 13:39:23)  Cyclophosphamide injection What is this medicine? CYCLOPHOSPHAMIDE (sye kloe FOSS fa mide) is a chemotherapy drug. It slows the growth of cancer cells. This medicine is used to treat many types of cancer like lymphoma, myeloma, leukemia, breast cancer, and ovarian cancer, to name a few. This medicine may be used for other purposes; ask your health care provider or pharmacist if you have questions. COMMON BRAND NAME(S): Cytoxan, Neosar What should I tell my health care provider before I take this medicine? They need to know if you have any of these conditions: -blood disorders -history of other chemotherapy -infection -kidney disease -liver disease -recent or ongoing  radiation therapy -tumors in the bone marrow -an unusual or allergic reaction to cyclophosphamide, other chemotherapy, other medicines, foods, dyes, or preservatives -pregnant or trying to get pregnant -breast-feeding How should I use this medicine? This drug is usually given as an injection into a vein or muscle or by infusion into a vein. It is administered in a hospital or clinic by a specially trained health care professional. Talk to your pediatrician regarding the use of this medicine in children. Special care may be needed. Overdosage: If you think you have taken too much of this medicine contact a poison control center or emergency room at once. NOTE: This medicine is only for you. Do not share this medicine with others. What if I miss a dose? It is important not to miss your dose. Call your doctor or health care professional if you are unable to keep an appointment. What may interact with this medicine? This medicine may interact with the following medications: -amiodarone -amphotericin B -azathioprine -certain antiviral medicines for HIV or AIDS such as protease inhibitors (e.g., indinavir, ritonavir) and zidovudine -certain blood pressure medications such as benazepril, captopril, enalapril, fosinopril, lisinopril, moexipril, monopril, perindopril, quinapril, ramipril, trandolapril -certain cancer medications such as anthracyclines (e.g., daunorubicin, doxorubicin), busulfan, cytarabine, paclitaxel, pentostatin, tamoxifen, trastuzumab -certain diuretics such as chlorothiazide, chlorthalidone, hydrochlorothiazide, indapamide, metolazone -certain medicines that treat or prevent blood clots like warfarin -certain   muscle relaxants such as succinylcholine -cyclosporine -etanercept -indomethacin -medicines to increase blood counts like filgrastim, pegfilgrastim, sargramostim -medicines used as general anesthesia -metronidazole -natalizumab This list may not describe all possible  interactions. Give your health care provider a list of all the medicines, herbs, non-prescription drugs, or dietary supplements you use. Also tell them if you smoke, drink alcohol, or use illegal drugs. Some items may interact with your medicine. What should I watch for while using this medicine? Visit your doctor for checks on your progress. This drug may make you feel generally unwell. This is not uncommon, as chemotherapy can affect healthy cells as well as cancer cells. Report any side effects. Continue your course of treatment even though you feel ill unless your doctor tells you to stop. Drink water or other fluids as directed. Urinate often, even at night. In some cases, you may be given additional medicines to help with side effects. Follow all directions for their use. Call your doctor or health care professional for advice if you get a fever, chills or sore throat, or other symptoms of a cold or flu. Do not treat yourself. This drug decreases your body's ability to fight infections. Try to avoid being around people who are sick. This medicine may increase your risk to bruise or bleed. Call your doctor or health care professional if you notice any unusual bleeding. Be careful brushing and flossing your teeth or using a toothpick because you may get an infection or bleed more easily. If you have any dental work done, tell your dentist you are receiving this medicine. You may get drowsy or dizzy. Do not drive, use machinery, or do anything that needs mental alertness until you know how this medicine affects you. Do not become pregnant while taking this medicine or for 1 year after stopping it. Women should inform their doctor if they wish to become pregnant or think they might be pregnant. Men should not father a child while taking this medicine and for 4 months after stopping it. There is a potential for serious side effects to an unborn child. Talk to your health care professional or pharmacist for  more information. Do not breast-feed an infant while taking this medicine. This medicine may interfere with the ability to have a child. This medicine has caused ovarian failure in some women. This medicine has caused reduced sperm counts in some men. You should talk with your doctor or health care professional if you are concerned about your fertility. If you are going to have surgery, tell your doctor or health care professional that you have taken this medicine. What side effects may I notice from receiving this medicine? Side effects that you should report to your doctor or health care professional as soon as possible: -allergic reactions like skin rash, itching or hives, swelling of the face, lips, or tongue -low blood counts - this medicine may decrease the number of white blood cells, red blood cells and platelets. You may be at increased risk for infections and bleeding. -signs of infection - fever or chills, cough, sore throat, pain or difficulty passing urine -signs of decreased platelets or bleeding - bruising, pinpoint red spots on the skin, black, tarry stools, blood in the urine -signs of decreased red blood cells - unusually weak or tired, fainting spells, lightheadedness -breathing problems -dark urine -dizziness -palpitations -swelling of the ankles, feet, hands -trouble passing urine or change in the amount of urine -weight gain -yellowing of the eyes or skin Side effects that   usually do not require medical attention (report to your doctor or health care professional if they continue or are bothersome): -changes in nail or skin color -hair loss -missed menstrual periods -mouth sores -nausea, vomiting This list may not describe all possible side effects. Call your doctor for medical advice about side effects. You may report side effects to FDA at 1-800-FDA-1088. Where should I keep my medicine? This drug is given in a hospital or clinic and will not be stored at  home. NOTE: This sheet is a summary. It may not cover all possible information. If you have questions about this medicine, talk to your doctor, pharmacist, or health care provider.  2018 [employer]vier/Gold Standard (2012-03-10 16:22:58)  

## 2017-07-18 NOTE — Progress Notes (Signed)
ANC = 1, okay to proceed with treatment per Dr. Marin Olp.  He has decreased CTX dose today.

## 2017-07-18 NOTE — Patient Instructions (Signed)
Implanted Port Home Guide An implanted port is a type of central line that is placed under the skin. Central lines are used to provide IV access when treatment or nutrition needs to be given through a person's veins. Implanted ports are used for long-term IV access. An implanted port may be placed because:  You need IV medicine that would be irritating to the small veins in your hands or arms.  You need long-term IV medicines, such as antibiotics.  You need IV nutrition for a long period.  You need frequent blood draws for lab tests.  You need dialysis.  Implanted ports are usually placed in the chest area, but they can also be placed in the upper arm, the abdomen, or the leg. An implanted port has two main parts:  Reservoir. The reservoir is round and will appear as a small, raised area under your skin. The reservoir is the part where a needle is inserted to give medicines or draw blood.  Catheter. The catheter is a thin, flexible tube that extends from the reservoir. The catheter is placed into a large vein. Medicine that is inserted into the reservoir goes into the catheter and then into the vein.  How will I care for my incision site? Do not get the incision site wet. Bathe or shower as directed by your health care provider. How is my port accessed? Special steps must be taken to access the port:  Before the port is accessed, a numbing cream can be placed on the skin. This helps numb the skin over the port site.  Your health care provider uses a sterile technique to access the port. ? Your health care provider must put on a mask and sterile gloves. ? The skin over your port is cleaned carefully with an antiseptic and allowed to dry. ? The port is gently pinched between sterile gloves, and a needle is inserted into the port.  Only "non-coring" port needles should be used to access the port. Once the port is accessed, a blood return should be checked. This helps ensure that the port  is in the vein and is not clogged.  If your port needs to remain accessed for a constant infusion, a clear (transparent) bandage will be placed over the needle site. The bandage and needle will need to be changed every week, or as directed by your health care provider.  Keep the bandage covering the needle clean and dry. Do not get it wet. Follow your health care provider's instructions on how to take a shower or bath while the port is accessed.  If your port does not need to stay accessed, no bandage is needed over the port.  What is flushing? Flushing helps keep the port from getting clogged. Follow your health care provider's instructions on how and when to flush the port. Ports are usually flushed with saline solution or a medicine called heparin. The need for flushing will depend on how the port is used.  If the port is used for intermittent medicines or blood draws, the port will need to be flushed: ? After medicines have been given. ? After blood has been drawn. ? As part of routine maintenance.  If a constant infusion is running, the port may not need to be flushed.  How long will my port stay implanted? The port can stay in for as long as your health care provider thinks it is needed. When it is time for the port to come out, surgery will be   done to remove it. The procedure is similar to the one performed when the port was put in. When should I seek immediate medical care? When you have an implanted port, you should seek immediate medical care if:  You notice a bad smell coming from the incision site.  You have swelling, redness, or drainage at the incision site.  You have more swelling or pain at the port site or the surrounding area.  You have a fever that is not controlled with medicine.  This information is not intended to replace advice given to you by your health care provider. Make sure you discuss any questions you have with your health care provider. Document  Released: 04/26/2005 Document Revised: 10/02/2015 Document Reviewed: 01/01/2013 Elsevier Interactive Patient Education  2017 [employer]  

## 2017-07-19 ENCOUNTER — Inpatient Hospital Stay: Payer: 59

## 2017-07-19 VITALS — BP 101/56 | HR 102 | Temp 98.5°F | Resp 17

## 2017-07-19 DIAGNOSIS — C9000 Multiple myeloma not having achieved remission: Secondary | ICD-10-CM

## 2017-07-19 DIAGNOSIS — Z5112 Encounter for antineoplastic immunotherapy: Secondary | ICD-10-CM | POA: Diagnosis not present

## 2017-07-19 LAB — KAPPA/LAMBDA LIGHT CHAINS
KAPPA FREE LGHT CHN: 18.9 mg/L (ref 3.3–19.4)
Kappa, lambda light chain ratio: 2.05 — ABNORMAL HIGH (ref 0.26–1.65)
Lambda free light chains: 9.2 mg/L (ref 5.7–26.3)

## 2017-07-19 LAB — IGG, IGA, IGM
IGA: 27 mg/dL — AB (ref 87–352)
IgG (Immunoglobin G), Serum: 1583 mg/dL (ref 700–1600)
IgM (Immunoglobulin M), Srm: 9 mg/dL — ABNORMAL LOW (ref 26–217)

## 2017-07-19 MED ORDER — SODIUM CHLORIDE 0.9% FLUSH
10.0000 mL | INTRAVENOUS | Status: DC | PRN
  Administered 2017-07-19: 10 mL via INTRAVENOUS
  Filled 2017-07-19: qty 10

## 2017-07-19 MED ORDER — HEPARIN SOD (PORK) LOCK FLUSH 100 UNIT/ML IV SOLN
500.0000 [IU] | Freq: Once | INTRAVENOUS | Status: AC
  Administered 2017-07-19: 500 [IU] via INTRAVENOUS
  Filled 2017-07-19: qty 5

## 2017-07-19 NOTE — Progress Notes (Signed)
NO treatment today due to current ill symtpoms, patient and MD Ennever agreed to keep appointments as scheduled for next month.

## 2017-07-21 LAB — PROTEIN ELECTROPHORESIS, SERUM, WITH REFLEX
A/G Ratio: 1.2 (ref 0.7–1.7)
ALPHA-1-GLOBULIN: 0.3 g/dL (ref 0.0–0.4)
ALPHA-2-GLOBULIN: 0.6 g/dL (ref 0.4–1.0)
Albumin ELP: 3.8 g/dL (ref 2.9–4.4)
Beta Globulin: 0.9 g/dL (ref 0.7–1.3)
Gamma Globulin: 1.4 g/dL (ref 0.4–1.8)
Globulin, Total: 3.1 g/dL (ref 2.2–3.9)
M-Spike, %: 1 g/dL — ABNORMAL HIGH
SPEP INTERP: 0
Total Protein ELP: 6.9 g/dL (ref 6.0–8.5)

## 2017-07-22 ENCOUNTER — Other Ambulatory Visit: Payer: Self-pay | Admitting: Family

## 2017-08-15 ENCOUNTER — Inpatient Hospital Stay: Payer: 59 | Attending: Hematology & Oncology | Admitting: Hematology & Oncology

## 2017-08-15 ENCOUNTER — Inpatient Hospital Stay: Payer: 59

## 2017-08-15 ENCOUNTER — Other Ambulatory Visit: Payer: Self-pay

## 2017-08-15 ENCOUNTER — Other Ambulatory Visit: Payer: Self-pay | Admitting: Hematology & Oncology

## 2017-08-15 ENCOUNTER — Other Ambulatory Visit: Payer: Self-pay | Admitting: Internal Medicine

## 2017-08-15 ENCOUNTER — Encounter: Payer: Self-pay | Admitting: Hematology & Oncology

## 2017-08-15 VITALS — BP 152/77 | HR 80 | Temp 98.2°F | Resp 16 | Wt 167.0 lb

## 2017-08-15 DIAGNOSIS — K909 Intestinal malabsorption, unspecified: Secondary | ICD-10-CM

## 2017-08-15 DIAGNOSIS — M546 Pain in thoracic spine: Secondary | ICD-10-CM

## 2017-08-15 DIAGNOSIS — D508 Other iron deficiency anemias: Secondary | ICD-10-CM

## 2017-08-15 DIAGNOSIS — C9000 Multiple myeloma not having achieved remission: Secondary | ICD-10-CM

## 2017-08-15 DIAGNOSIS — G8929 Other chronic pain: Secondary | ICD-10-CM | POA: Diagnosis not present

## 2017-08-15 DIAGNOSIS — Z5112 Encounter for antineoplastic immunotherapy: Secondary | ICD-10-CM | POA: Insufficient documentation

## 2017-08-15 DIAGNOSIS — M542 Cervicalgia: Secondary | ICD-10-CM

## 2017-08-15 DIAGNOSIS — M545 Low back pain: Secondary | ICD-10-CM | POA: Diagnosis not present

## 2017-08-15 DIAGNOSIS — Z5111 Encounter for antineoplastic chemotherapy: Secondary | ICD-10-CM | POA: Insufficient documentation

## 2017-08-15 DIAGNOSIS — R040 Epistaxis: Secondary | ICD-10-CM

## 2017-08-15 DIAGNOSIS — M898X9 Other specified disorders of bone, unspecified site: Secondary | ICD-10-CM

## 2017-08-15 DIAGNOSIS — C9002 Multiple myeloma in relapse: Secondary | ICD-10-CM

## 2017-08-15 DIAGNOSIS — D500 Iron deficiency anemia secondary to blood loss (chronic): Secondary | ICD-10-CM

## 2017-08-15 DIAGNOSIS — M899 Disorder of bone, unspecified: Secondary | ICD-10-CM

## 2017-08-15 HISTORY — DX: Iron deficiency anemia secondary to blood loss (chronic): D50.0

## 2017-08-15 HISTORY — DX: Intestinal malabsorption, unspecified: K90.9

## 2017-08-15 LAB — CBC WITH DIFFERENTIAL (CANCER CENTER ONLY)
BASOS PCT: 0 %
Basophils Absolute: 0 10*3/uL (ref 0.0–0.1)
Eosinophils Absolute: 0.1 10*3/uL (ref 0.0–0.5)
Eosinophils Relative: 5 %
HEMATOCRIT: 30 % — AB (ref 34.8–46.6)
Hemoglobin: 10 g/dL — ABNORMAL LOW (ref 11.6–15.9)
Lymphocytes Relative: 26 %
Lymphs Abs: 0.6 10*3/uL — ABNORMAL LOW (ref 0.9–3.3)
MCH: 31.3 pg (ref 26.0–34.0)
MCHC: 33.3 g/dL (ref 32.0–36.0)
MCV: 93.8 fL (ref 81.0–101.0)
MONOS PCT: 19 %
Monocytes Absolute: 0.4 10*3/uL (ref 0.1–0.9)
NEUTROS ABS: 1.1 10*3/uL — AB (ref 1.5–6.5)
NEUTROS PCT: 50 %
Platelet Count: 99 10*3/uL — ABNORMAL LOW (ref 145–400)
RBC: 3.2 MIL/uL — ABNORMAL LOW (ref 3.70–5.32)
RDW: 12.1 % (ref 11.1–15.7)
WBC Count: 2.2 10*3/uL — ABNORMAL LOW (ref 3.9–10.0)

## 2017-08-15 LAB — IRON AND TIBC
IRON: 67 ug/dL (ref 41–142)
Saturation Ratios: 27 % (ref 21–57)
TIBC: 250 ug/dL (ref 236–444)
UIBC: 183 ug/dL

## 2017-08-15 LAB — CMP (CANCER CENTER ONLY)
ALBUMIN: 4.1 g/dL (ref 3.5–5.0)
ALK PHOS: 64 U/L (ref 26–84)
ALT: 18 U/L (ref 10–47)
AST: 24 U/L (ref 11–38)
Anion gap: 11 (ref 5–15)
BILIRUBIN TOTAL: 0.6 mg/dL (ref 0.2–1.6)
BUN: 20 mg/dL (ref 7–22)
CALCIUM: 9.1 mg/dL (ref 8.0–10.3)
CHLORIDE: 108 mmol/L (ref 98–108)
CO2: 26 mmol/L (ref 18–33)
CREATININE: 0.8 mg/dL (ref 0.60–1.20)
Glucose, Bld: 87 mg/dL (ref 73–118)
Potassium: 3.9 mmol/L (ref 3.3–4.7)
Sodium: 145 mmol/L (ref 128–145)
Total Protein: 7 g/dL (ref 6.4–8.1)

## 2017-08-15 LAB — FERRITIN: Ferritin: 383 ng/mL — ABNORMAL HIGH (ref 9–269)

## 2017-08-15 MED ORDER — SODIUM CHLORIDE 0.9 % IV SOLN
180.0000 mg/m2 | Freq: Once | INTRAVENOUS | Status: AC
  Administered 2017-08-15: 340 mg via INTRAVENOUS
  Filled 2017-08-15: qty 17

## 2017-08-15 MED ORDER — DEXAMETHASONE SODIUM PHOSPHATE 10 MG/ML IJ SOLN
10.0000 mg | Freq: Once | INTRAMUSCULAR | Status: AC
  Administered 2017-08-15: 10 mg via INTRAVENOUS

## 2017-08-15 MED ORDER — DEXTROSE 5 % IV SOLN
36.0000 mg/m2 | Freq: Once | INTRAVENOUS | Status: AC
  Administered 2017-08-15: 68 mg via INTRAVENOUS
  Filled 2017-08-15: qty 4

## 2017-08-15 MED ORDER — DEXAMETHASONE SODIUM PHOSPHATE 10 MG/ML IJ SOLN
INTRAMUSCULAR | Status: AC
Start: 2017-08-15 — End: ?
  Filled 2017-08-15: qty 1

## 2017-08-15 MED ORDER — SODIUM CHLORIDE 0.9% FLUSH
10.0000 mL | INTRAVENOUS | Status: DC | PRN
  Administered 2017-08-15: 10 mL
  Filled 2017-08-15: qty 10

## 2017-08-15 MED ORDER — SODIUM CHLORIDE 0.9 % IV SOLN
Freq: Once | INTRAVENOUS | Status: AC
Start: 2017-08-15 — End: 2017-08-15
  Administered 2017-08-15: 10:00:00 via INTRAVENOUS

## 2017-08-15 MED ORDER — PALONOSETRON HCL INJECTION 0.25 MG/5ML
0.2500 mg | Freq: Once | INTRAVENOUS | Status: AC
  Administered 2017-08-15: 0.25 mg via INTRAVENOUS

## 2017-08-15 MED ORDER — HEPARIN SOD (PORK) LOCK FLUSH 100 UNIT/ML IV SOLN
500.0000 [IU] | Freq: Once | INTRAVENOUS | Status: AC | PRN
  Administered 2017-08-15: 500 [IU]
  Filled 2017-08-15: qty 5

## 2017-08-15 MED ORDER — PALONOSETRON HCL INJECTION 0.25 MG/5ML
INTRAVENOUS | Status: AC
  Filled 2017-08-15: qty 5

## 2017-08-15 MED ORDER — FERUMOXYTOL INJECTION 510 MG/17 ML
510.0000 mg | Freq: Once | INTRAVENOUS | Status: AC
  Administered 2017-08-15: 510 mg via INTRAVENOUS
  Filled 2017-08-15: qty 17

## 2017-08-15 MED ORDER — SODIUM CHLORIDE 0.9 % IV SOLN: Freq: Once | INTRAVENOUS | Status: AC

## 2017-08-15 NOTE — Progress Notes (Signed)
Ok to treat with anc of 1 & platelets of 99 per Dr Marin Olp. dph

## 2017-08-15 NOTE — Patient Instructions (Addendum)
Cyclophosphamide injection What is this medicine? CYCLOPHOSPHAMIDE (sye kloe FOSS fa mide) is a chemotherapy drug. It slows the growth of cancer cells. This medicine is used to treat many types of cancer like lymphoma, myeloma, leukemia, breast cancer, and ovarian cancer, to name a few. This medicine may be used for other purposes; ask your health care provider or pharmacist if you have questions. COMMON BRAND NAME(S): Cytoxan, Neosar What should I tell my health care provider before I take this medicine? They need to know if you have any of these conditions: -blood disorders -history of other chemotherapy -infection -kidney disease -liver disease -recent or ongoing radiation therapy -tumors in the bone marrow -an unusual or allergic reaction to cyclophosphamide, other chemotherapy, other medicines, foods, dyes, or preservatives -pregnant or trying to get pregnant -breast-feeding How should I use this medicine? This drug is usually given as an injection into a vein or muscle or by infusion into a vein. It is administered in a hospital or clinic by a specially trained health care professional. Talk to your pediatrician regarding the use of this medicine in children. Special care may be needed. Overdosage: If you think you have taken too much of this medicine contact a poison control center or emergency room at once. NOTE: This medicine is only for you. Do not share this medicine with others. What if I miss a dose? It is important not to miss your dose. Call your doctor or health care professional if you are unable to keep an appointment. What may interact with this medicine? This medicine may interact with the following medications: -amiodarone -amphotericin B -azathioprine -certain antiviral medicines for HIV or AIDS such as protease inhibitors (e.g., indinavir, ritonavir) and zidovudine -certain blood pressure medications such as benazepril, captopril, enalapril, fosinopril,  lisinopril, moexipril, monopril, perindopril, quinapril, ramipril, trandolapril -certain cancer medications such as anthracyclines (e.g., daunorubicin, doxorubicin), busulfan, cytarabine, paclitaxel, pentostatin, tamoxifen, trastuzumab -certain diuretics such as chlorothiazide, chlorthalidone, hydrochlorothiazide, indapamide, metolazone -certain medicines that treat or prevent blood clots like warfarin -certain muscle relaxants such as succinylcholine -cyclosporine -etanercept -indomethacin -medicines to increase blood counts like filgrastim, pegfilgrastim, sargramostim -medicines used as general anesthesia -metronidazole -natalizumab This list may not describe all possible interactions. Give your health care provider a list of all the medicines, herbs, non-prescription drugs, or dietary supplements you use. Also tell them if you smoke, drink alcohol, or use illegal drugs. Some items may interact with your medicine. What should I watch for while using this medicine? Visit your doctor for checks on your progress. This drug may make you feel generally unwell. This is not uncommon, as chemotherapy can affect healthy cells as well as cancer cells. Report any side effects. Continue your course of treatment even though you feel ill unless your doctor tells you to stop. Drink water or other fluids as directed. Urinate often, even at night. In some cases, you may be given additional medicines to help with side effects. Follow all directions for their use. Call your doctor or health care professional for advice if you get a fever, chills or sore throat, or other symptoms of a cold or flu. Do not treat yourself. This drug decreases your body's ability to fight infections. Try to avoid being around people who are sick. This medicine may increase your risk to bruise or bleed. Call your doctor or health care professional if you notice any unusual bleeding. Be careful brushing and flossing your teeth or using a  toothpick because you may get an infection or bleed   more easily. If you have any dental work done, tell your dentist you are receiving this medicine. You may get drowsy or dizzy. Do not drive, use machinery, or do anything that needs mental alertness until you know how this medicine affects you. Do not become pregnant while taking this medicine or for 1 year after stopping it. Women should inform their doctor if they wish to become pregnant or think they might be pregnant. Men should not father a child while taking this medicine and for 4 months after stopping it. There is a potential for serious side effects to an unborn child. Talk to your health care professional or pharmacist for more information. Do not breast-feed an infant while taking this medicine. This medicine may interfere with the ability to have a child. This medicine has caused ovarian failure in some women. This medicine has caused reduced sperm counts in some men. You should talk with your doctor or health care professional if you are concerned about your fertility. If you are going to have surgery, tell your doctor or health care professional that you have taken this medicine. What side effects may I notice from receiving this medicine? Side effects that you should report to your doctor or health care professional as soon as possible: -allergic reactions like skin rash, itching or hives, swelling of the face, lips, or tongue -low blood counts - this medicine may decrease the number of white blood cells, red blood cells and platelets. You may be at increased risk for infections and bleeding. -signs of infection - fever or chills, cough, sore throat, pain or difficulty passing urine -signs of decreased platelets or bleeding - bruising, pinpoint red spots on the skin, black, tarry stools, blood in the urine -signs of decreased red blood cells - unusually weak or tired, fainting spells, lightheadedness -breathing problems -dark  urine -dizziness -palpitations -swelling of the ankles, feet, hands -trouble passing urine or change in the amount of urine -weight gain -yellowing of the eyes or skin Side effects that usually do not require medical attention (report to your doctor or health care professional if they continue or are bothersome): -changes in nail or skin color -hair loss -missed menstrual periods -mouth sores -nausea, vomiting This list may not describe all possible side effects. Call your doctor for medical advice about side effects. You may report side effects to FDA at 1-800-FDA-1088. Where should I keep my medicine? This drug is given in a hospital or clinic and will not be stored at home. NOTE: This sheet is a summary. It may not cover all possible information. If you have questions about this medicine, talk to your doctor, pharmacist, or health care provider.  2018 Elsevier/Gold Standard (2012-03-10 16:22:58) ) Carfilzomib injection What is this medicine? CARFILZOMIB (kar FILZ oh mib) targets a specific protein within cancer cells and stops the cancer cells from growing. It is used to treat multiple myeloma. This medicine may be used for other purposes; ask your health care provider or pharmacist if you have questions. COMMON BRAND NAME(S): KYPROLIS What should I tell my health care provider before I take this medicine? They need to know if you have any of these conditions: -heart disease -history of blood clots -irregular heartbeat -kidney disease -liver disease -lung or breathing disease -an unusual or allergic reaction to carfilzomib, or other medicines, foods, dyes, or preservatives -pregnant or trying to get pregnant -breast-feeding How should I use this medicine? This medicine is for injection or infusion into a vein. It is  given by a health care professional in a hospital or clinic setting. Talk to your pediatrician regarding the use of this medicine in children. Special care may be  needed. Overdosage: If you think you have taken too much of this medicine contact a poison control center or emergency room at once. NOTE: This medicine is only for you. Do not share this medicine with others. What if I miss a dose? It is important not to miss your dose. Call your doctor or health care professional if you are unable to keep an appointment. What may interact with this medicine? Interactions are not expected. Give your health care provider a list of all the medicines, herbs, non-prescription drugs, or dietary supplements you use. Also tell them if you smoke, drink alcohol, or use illegal drugs. Some items may interact with your medicine. This list may not describe all possible interactions. Give your health care provider a list of all the medicines, herbs, non-prescription drugs, or dietary supplements you use. Also tell them if you smoke, drink alcohol, or use illegal drugs. Some items may interact with your medicine. What should I watch for while using this medicine? Your condition will be monitored carefully while you are receiving this medicine. Report any side effects. Continue your course of treatment even though you feel ill unless your doctor tells you to stop. You may need blood work done while you are taking this medicine. Do not become pregnant while taking this medicine or for at least 30 days after stopping it. Women should inform their doctor if they wish to become pregnant or think they might be pregnant. There is a potential for serious side effects to an unborn child. Men should not father a child while taking this medicine and for 90 days after stopping it. Talk to your health care professional or pharmacist for more information. Do not breast-feed an infant while taking this medicine. Check with your doctor or health care professional if you get an attack of severe diarrhea, nausea and vomiting, or if you sweat a lot. The loss of too much body fluid can make it  dangerous for you to take this medicine. You may get dizzy. Do not drive, use machinery, or do anything that needs mental alertness until you know how this medicine affects you. Do not stand or sit up quickly, especially if you are an older patient. This reduces the risk of dizzy or fainting spells. What side effects may I notice from receiving this medicine? Side effects that you should report to your doctor or health care professional as soon as possible: -allergic reactions like skin rash, itching or hives, swelling of the face, lips, or tongue -confusion -dizziness -feeling faint or lightheaded -fever or chills -palpitations -seizures -signs and symptoms of bleeding such as bloody or black, tarry stools; red or dark-brown urine; spitting up blood or brown material that looks like coffee grounds; red spots on the skin; unusual bruising or bleeding including from the eye, gums, or nose -signs and symptoms of a blood clot such as breathing problems; changes in vision; chest pain; severe, sudden headache; pain, swelling, warmth in the leg; trouble speaking; sudden numbness or weakness of the face, arm or leg -signs and symptoms of kidney injury like trouble passing urine or change in the amount of urine -signs and symptoms of liver injury like dark yellow or brown urine; general ill feeling or flu-like symptoms; light-colored stools; loss of appetite; nausea; right upper belly pain; unusually weak or tired; yellowing of  the eyes or skin Side effects that usually do not require medical attention (report to your doctor or health care professional if they continue or are bothersome): -back pain -cough -diarrhea -headache -muscle cramps -vomiting This list may not describe all possible side effects. Call your doctor for medical advice about side effects. You may report side effects to FDA at 1-800-FDA-1088. Where should I keep my medicine? This drug is given in a hospital or clinic and will not  be stored at home. NOTE: This sheet is a summary. It may not cover all possible information. If you have questions about this medicine, talk to your doctor, pharmacist, or health care provider.  2018 Elsevier/Gold Standard (2015-05-29 13:39:23) Ferumoxytol injection What is this medicine? FERUMOXYTOL is an iron complex. Iron is used to make healthy red blood cells, which carry oxygen and nutrients throughout the body. This medicine is used to treat iron deficiency anemia in people with chronic kidney disease. This medicine may be used for other purposes; ask your health care provider or pharmacist if you have questions. COMMON BRAND NAME(S): Feraheme What should I tell my health care provider before I take this medicine? They need to know if you have any of these conditions: -anemia not caused by low iron levels -high levels of iron in the blood -magnetic resonance imaging (MRI) test scheduled -an unusual or allergic reaction to iron, other medicines, foods, dyes, or preservatives -pregnant or trying to get pregnant -breast-feeding How should I use this medicine? This medicine is for injection into a vein. It is given by a health care professional in a hospital or clinic setting. Talk to your pediatrician regarding the use of this medicine in children. Special care may be needed. Overdosage: If you think you have taken too much of this medicine contact a poison control center or emergency room at once. NOTE: This medicine is only for you. Do not share this medicine with others. What if I miss a dose? It is important not to miss your dose. Call your doctor or health care professional if you are unable to keep an appointment. What may interact with this medicine? This medicine may interact with the following medications: -other iron products This list may not describe all possible interactions. Give your health care provider a list of all the medicines, herbs, non-prescription drugs, or  dietary supplements you use. Also tell them if you smoke, drink alcohol, or use illegal drugs. Some items may interact with your medicine. What should I watch for while using this medicine? Visit your doctor or healthcare professional regularly. Tell your doctor or healthcare professional if your symptoms do not start to get better or if they get worse. You may need blood work done while you are taking this medicine. You may need to follow a special diet. Talk to your doctor. Foods that contain iron include: whole grains/cereals, dried fruits, beans, or peas, leafy green vegetables, and organ meats (liver, kidney). What side effects may I notice from receiving this medicine? Side effects that you should report to your doctor or health care professional as soon as possible: -allergic reactions like skin rash, itching or hives, swelling of the face, lips, or tongue -breathing problems -changes in blood pressure -feeling faint or lightheaded, falls -fever or chills -flushing, sweating, or hot feelings -swelling of the ankles or feet Side effects that usually do not require medical attention (report to your doctor or health care professional if they continue or are bothersome): -diarrhea -headache -nausea, vomiting -stomach pain  This list may not describe all possible side effects. Call your doctor for medical advice about side effects. You may report side effects to FDA at 1-800-FDA-1088. Where should I keep my medicine? This drug is given in a hospital or clinic and will not be stored at home. NOTE: This sheet is a summary. It may not cover all possible information. If you have questions about this medicine, talk to your doctor, pharmacist, or health care provider.  2018 Elsevier/Gold Standard (2015-05-29 12:41:49)

## 2017-08-15 NOTE — Addendum Note (Signed)
Addended by: Burney Gauze R on: 08/15/2017 10:33 AM   Modules accepted: Orders

## 2017-08-15 NOTE — Progress Notes (Addendum)
Hematology and Oncology Follow Up Visit  Barbara Cook [id number] 19-May-1955 62 y.o. 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  She is doing fairly well.  She has some chronic back issues.  We have looked at this before.  I know that she has some wear and tear in the back.  We have given her a little bit of a break from treatment.  I thought this would help her out.  When we last saw her, her myeloma studies continue to improve.  Her M spike was down to 1.0 g/dL.  Her IgG level was 1583 mg/dL.  Her kappa light chain was 1.9 mg/dL.  She said that with her last cycle, she did get some diarrhea.  Again she thinks this is from the Kyprolis.  I am unsure if this is truly the case.  We last saw her, she had low iron.  As such, we will give her a dose of IV iron.  I think this will help her out.  She has had no fever.  She is very worried about being out in public and getting sick.  I try to reassure her that I thought that her risk of getting sick was quite low.    Currently, her performance status is ECOG 1.  Medications:  Allergies as of 08/15/2017      Reactions   Codeine Palpitations      Medication List        Accurate as of 08/15/17 10:20 AM. Always use your most recent med list.          acetaminophen 500 MG tablet Commonly known as:  TYLENOL Take 500 mg by mouth every 6 (six) hours as needed for mild pain or headache.   acyclovir 400 MG tablet Commonly known as:  ZOVIRAX Take 1 tablet (400 mg total) by mouth daily.   benzonatate  100 MG capsule Commonly known as:  TESSALON PERLES Take 2 capsules (200 mg total) 3 (three) times daily as needed by mouth for cough.   diazepam 2 MG tablet Commonly known as:  VALIUM Take 2 tablets (4 mg total) by mouth every 8 (eight) hours as needed for anxiety.   hyoscyamine 0.125 MG SL tablet Commonly known as:  LEVSIN SL Place 1 tablet (0.125 mg total) under the tongue every 4 (four) hours as needed.   magnesium oxide 400 (241.3 Mg) MG tablet Commonly known as:  MAG-OX TAKE 1 TABLET BY MOUTH TWICE A DAY   multivitamin tablet Take 1 tablet by mouth every evening.   ondansetron 8 MG tablet Commonly known as:  ZOFRAN TAKE 1 TABLET BY MOUTH TWICE A DAY FOR NAUSEA AND VOMITING 1 TABLET 1 HOUR PRIOR TO CHEMOTHERAPY   pantoprazole 40 MG tablet Commonly known as:  PROTONIX Take 1 tablet (40 mg total) by mouth daily.   polyethylene glycol powder powder Commonly known as:  GLYCOLAX/MIRALAX 1 capful daily as needed   prochlorperazine 10 MG tablet Commonly known as:  COMPAZINE Take 1 tablet (10 mg total) by mouth every 6 (  six) hours as needed for nausea or vomiting.   torsemide 20 MG tablet Commonly known as:  DEMADEX Take 1 tablet (20 mg total) by mouth daily as needed.   traMADol 50 MG tablet Commonly known as:  ULTRAM Take 1 tablet (50 mg total) every 6 (six) hours as needed by mouth.   vitamin B-6 250 MG tablet Take 1 tablet (250 mg total) by mouth daily.   Vitamin D (Ergocalciferol) 50000 units Caps capsule Commonly known as:  DRISDOL TAKE 1 CAPSULE (50,000 UNITS TOTAL) BY MOUTH ONCE A WEEK.       Allergies:  Allergies  Allergen Reactions  . Codeine Palpitations    Past Medical History, Surgical history, Social history, and Family History were reviewed and updated.  Review of Systems: Review of Systems  Constitutional: Negative.   HENT: Negative.   Eyes: Negative.   Respiratory: Negative.   Cardiovascular: Negative.   Gastrointestinal: Negative.     Genitourinary: Negative.   Musculoskeletal: Negative.   Skin: Negative.   Neurological: Negative.   Endo/Heme/Allergies: Negative.   Psychiatric/Behavioral: Negative.     Physical Exam:  weight is 167 lb (75.8 kg). Her oral temperature is 98.2 F (36.8 C). Her blood pressure is 152/77 (abnormal) and her pulse is 80. Her respiration is 16 and oxygen saturation is 100%.   Wt Readings from Last 3 Encounters:  08/15/17 167 lb (75.8 kg)  07/18/17 165 lb 0.3 oz (74.9 kg)  06/13/17 164 lb (74.4 kg)    Physical Exam  Constitutional: She is oriented to person, place, and time.  HENT:  Head: Normocephalic and atraumatic.  Mouth/Throat: Oropharynx is clear and moist.  Eyes: Pupils are equal, round, and reactive to light. EOM are normal.  Neck: Normal range of motion.  Cardiovascular: Normal rate, regular rhythm and normal heart sounds.  Pulmonary/Chest: Effort normal and breath sounds normal.  Abdominal: Soft. Bowel sounds are normal.  Musculoskeletal: Normal range of motion. She exhibits no edema, tenderness or deformity.  Lymphadenopathy:    She has no cervical adenopathy.  Neurological: She is alert and oriented to person, place, and time.  Skin: Skin is warm and dry. No rash noted. No erythema.  Psychiatric: She has a normal mood and affect. Her behavior is normal. Judgment and thought content normal.  Vitals reviewed.         Lab Results  Component Value Date   WBC 2.2 (L) 08/15/2017   HGB 10.0 (L) 05/09/2017   HCT 30.0 (L) 08/15/2017   MCV 93.8 08/15/2017   PLT 99 (L) 08/15/2017   Lab Results  Component Value Date   FERRITIN 557 (H) 07/18/2017   IRON 52 07/18/2017   TIBC 268 07/18/2017   UIBC 215 07/18/2017   IRONPCTSAT 20 (L) 07/18/2017   Lab Results  Component Value Date   RETICCTPCT 1.1 07/18/2017   RBC 3.20 (L) 08/15/2017   RETICCTABS 35.9 12/25/2014   Lab Results  Component Value Date   KPAFRELGTCHN 18.9 07/18/2017   LAMBDASER 9.2 07/18/2017    KAPLAMBRATIO 2.05 (H) 07/18/2017   Lab Results  Component Value Date   IGGSERUM 1,583 07/18/2017   IGA 27 (L) 07/18/2017   IGMSERUM 9 (L) 07/18/2017   Lab Results  Component Value Date   TOTALPROTELP 6.9 07/18/2017   ALBUMINELP 3.8 07/18/2017   A1GS 0.3 07/18/2017   A2GS 0.6 07/18/2017   BETS 0.9 07/18/2017   BETA2SER 0.3 05/07/2015   GAMS 1.4 07/18/2017   MSPIKE 1.0 (H) 07/18/2017   SPEI *  05/07/2015     Chemistry      Component Value Date/Time   NA 145 08/15/2017 0900   NA 143 05/09/2017 0812   NA 139 02/20/2016 1207   K 3.9 08/15/2017 0900   K 4.0 05/09/2017 0812   K 3.8 02/20/2016 1207   CL 108 08/15/2017 0900   CL 108 05/09/2017 0812   CO2 26 08/15/2017 0900   CO2 26 05/09/2017 0812   CO2 29 02/20/2016 1207   BUN 20 08/15/2017 0900   BUN 13 05/09/2017 0812   BUN 22.3 02/20/2016 1207   CREATININE 0.80 08/15/2017 0900   CREATININE 0.9 05/09/2017 0812   CREATININE 1.3 (H) 02/20/2016 1207      Component Value Date/Time   CALCIUM 9.1 08/15/2017 0900   CALCIUM 8.3 05/09/2017 0812   CALCIUM 9.7 02/20/2016 1207   ALKPHOS 64 08/15/2017 0900   ALKPHOS 123 (H) 05/09/2017 0812   ALKPHOS 62 02/20/2016 1207   AST 24 08/15/2017 0900   AST 27 02/20/2016 1207   ALT 18 08/15/2017 0900   ALT 26 05/09/2017 0812   ALT 19 02/20/2016 1207   BILITOT 0.6 08/15/2017 0900   BILITOT 0.44 02/20/2016 1207      Impression and Plan: Barbara Cook is a very pleasant 62 yo African American female with IgG Kappa myeloma.   I am going to go ahead and treat her.  Her white cell count is borderline.  I am going to keep readjusting her dose of Cytoxan.  At this point, I think that we probably can move her treatments to every other week.  I think this would help her out.  I think this would still be quite active and effective for her.  I will plan to see her back in 2 weeks.   [name], MD 4/8/201910:20 AM

## 2017-08-16 ENCOUNTER — Ambulatory Visit: Payer: 59

## 2017-08-16 LAB — IGG, IGA, IGM
IGA: 29 mg/dL — AB (ref 87–352)
IGG (IMMUNOGLOBIN G), SERUM: 1483 mg/dL (ref 700–1600)
IGM (IMMUNOGLOBULIN M), SRM: 12 mg/dL — AB (ref 26–217)

## 2017-08-16 LAB — KAPPA/LAMBDA LIGHT CHAINS
KAPPA FREE LGHT CHN: 19.7 mg/L — AB (ref 3.3–19.4)
KAPPA, LAMDA LIGHT CHAIN RATIO: 2.43 — AB (ref 0.26–1.65)
Lambda free light chains: 8.1 mg/L (ref 5.7–26.3)

## 2017-08-19 LAB — IMMUNOFIXATION REFLEX, SERUM
IgA: 28 mg/dL — ABNORMAL LOW (ref 87–352)
IgG (Immunoglobin G), Serum: 1514 mg/dL (ref 700–1600)
IgM (Immunoglobulin M), Srm: 12 mg/dL — ABNORMAL LOW (ref 26–217)

## 2017-08-19 LAB — PROTEIN ELECTROPHORESIS, SERUM, WITH REFLEX
A/G Ratio: 1.3 (ref 0.7–1.7)
ALBUMIN ELP: 3.8 g/dL (ref 2.9–4.4)
Alpha-1-Globulin: 0.2 g/dL (ref 0.0–0.4)
Alpha-2-Globulin: 0.5 g/dL (ref 0.4–1.0)
Beta Globulin: 0.9 g/dL (ref 0.7–1.3)
GAMMA GLOBULIN: 1.4 g/dL (ref 0.4–1.8)
Globulin, Total: 3 g/dL (ref 2.2–3.9)
M-Spike, %: 0.9 g/dL — ABNORMAL HIGH
SPEP INTERP: 0
TOTAL PROTEIN ELP: 6.8 g/dL (ref 6.0–8.5)

## 2017-08-22 ENCOUNTER — Ambulatory Visit: Payer: 59

## 2017-08-22 ENCOUNTER — Other Ambulatory Visit: Payer: 59

## 2017-08-22 ENCOUNTER — Telehealth: Payer: Self-pay | Admitting: *Deleted

## 2017-08-22 NOTE — Telephone Encounter (Addendum)
-----   Message from [name], MD sent at 08/21/2017  8:03 PM EDT ----- Called patient to let her know the myeloma level went down to 0.9 from 1.0.  Great job!!

## 2017-08-23 ENCOUNTER — Ambulatory Visit: Payer: 59

## 2017-08-29 ENCOUNTER — Inpatient Hospital Stay: Payer: 59

## 2017-08-29 ENCOUNTER — Other Ambulatory Visit: Payer: Self-pay | Admitting: Internal Medicine

## 2017-08-29 ENCOUNTER — Inpatient Hospital Stay (HOSPITAL_BASED_OUTPATIENT_CLINIC_OR_DEPARTMENT_OTHER): Payer: 59 | Admitting: Hematology & Oncology

## 2017-08-29 ENCOUNTER — Encounter: Payer: Self-pay | Admitting: Hematology & Oncology

## 2017-08-29 ENCOUNTER — Other Ambulatory Visit: Payer: Self-pay

## 2017-08-29 VITALS — BP 119/63 | HR 71 | Temp 97.8°F | Resp 17 | Wt 165.0 lb

## 2017-08-29 DIAGNOSIS — C9000 Multiple myeloma not having achieved remission: Secondary | ICD-10-CM

## 2017-08-29 DIAGNOSIS — M818 Other osteoporosis without current pathological fracture: Secondary | ICD-10-CM

## 2017-08-29 DIAGNOSIS — M545 Low back pain: Secondary | ICD-10-CM | POA: Diagnosis not present

## 2017-08-29 DIAGNOSIS — G8929 Other chronic pain: Secondary | ICD-10-CM | POA: Diagnosis not present

## 2017-08-29 DIAGNOSIS — C9002 Multiple myeloma in relapse: Secondary | ICD-10-CM

## 2017-08-29 DIAGNOSIS — R040 Epistaxis: Secondary | ICD-10-CM

## 2017-08-29 DIAGNOSIS — Z5112 Encounter for antineoplastic immunotherapy: Secondary | ICD-10-CM | POA: Diagnosis not present

## 2017-08-29 LAB — CBC WITH DIFFERENTIAL (CANCER CENTER ONLY)
Basophils Absolute: 0 10*3/uL (ref 0.0–0.1)
Basophils Relative: 1 %
Eosinophils Absolute: 0.1 10*3/uL (ref 0.0–0.5)
Eosinophils Relative: 4 %
HEMATOCRIT: 30.7 % — AB (ref 34.8–46.6)
Hemoglobin: 10.3 g/dL — ABNORMAL LOW (ref 11.6–15.9)
LYMPHS ABS: 0.6 10*3/uL — AB (ref 0.9–3.3)
LYMPHS PCT: 28 %
MCH: 30.9 pg (ref 26.0–34.0)
MCHC: 33.6 g/dL (ref 32.0–36.0)
MCV: 92.2 fL (ref 81.0–101.0)
MONO ABS: 0.4 10*3/uL (ref 0.1–0.9)
Monocytes Relative: 19 %
NEUTROS ABS: 1.1 10*3/uL — AB (ref 1.5–6.5)
Neutrophils Relative %: 48 %
Platelet Count: 125 10*3/uL — ABNORMAL LOW (ref 145–400)
RBC: 3.33 MIL/uL — ABNORMAL LOW (ref 3.70–5.32)
RDW: 12.5 % (ref 11.1–15.7)
WBC Count: 2.2 10*3/uL — ABNORMAL LOW (ref 3.9–10.0)

## 2017-08-29 LAB — CMP (CANCER CENTER ONLY)
ALT: 26 U/L (ref 10–47)
ANION GAP: 7 (ref 5–15)
AST: 24 U/L (ref 11–38)
Albumin: 3.7 g/dL (ref 3.5–5.0)
Alkaline Phosphatase: 68 U/L (ref 26–84)
BILIRUBIN TOTAL: 0.6 mg/dL (ref 0.2–1.6)
BUN: 22 mg/dL (ref 7–22)
CO2: 28 mmol/L (ref 18–33)
Calcium: 9.2 mg/dL (ref 8.0–10.3)
Chloride: 111 mmol/L — ABNORMAL HIGH (ref 98–108)
Creatinine: 1 mg/dL (ref 0.60–1.20)
Glucose, Bld: 114 mg/dL (ref 73–118)
Potassium: 4 mmol/L (ref 3.3–4.7)
Sodium: 146 mmol/L — ABNORMAL HIGH (ref 128–145)
TOTAL PROTEIN: 7.2 g/dL (ref 6.4–8.1)

## 2017-08-29 LAB — IRON AND TIBC
IRON: 72 ug/dL (ref 41–142)
Saturation Ratios: 29 % (ref 21–57)
TIBC: 251 ug/dL (ref 236–444)
UIBC: 179 ug/dL

## 2017-08-29 LAB — FERRITIN: Ferritin: 1083 ng/mL — ABNORMAL HIGH (ref 9–269)

## 2017-08-29 MED ORDER — SODIUM CHLORIDE 0.9 % IV SOLN
180.0000 mg/m2 | Freq: Once | INTRAVENOUS | Status: AC
  Administered 2017-08-29: 340 mg via INTRAVENOUS
  Filled 2017-08-29: qty 17

## 2017-08-29 MED ORDER — DEXTROSE 5 % IV SOLN
37.0000 mg/m2 | Freq: Once | INTRAVENOUS | Status: AC
  Administered 2017-08-29: 70 mg via INTRAVENOUS
  Filled 2017-08-29: qty 30

## 2017-08-29 MED ORDER — DEXAMETHASONE SODIUM PHOSPHATE 10 MG/ML IJ SOLN
10.0000 mg | Freq: Once | INTRAMUSCULAR | Status: AC
  Administered 2017-08-29: 10 mg via INTRAVENOUS

## 2017-08-29 MED ORDER — DEXAMETHASONE SODIUM PHOSPHATE 10 MG/ML IJ SOLN
INTRAMUSCULAR | Status: AC
  Filled 2017-08-29: qty 1

## 2017-08-29 MED ORDER — SODIUM CHLORIDE 0.9 % IV SOLN
Freq: Once | INTRAVENOUS | Status: AC
  Administered 2017-08-29: 09:00:00 via INTRAVENOUS

## 2017-08-29 MED ORDER — SODIUM CHLORIDE 0.9 % IV SOLN
Freq: Once | INTRAVENOUS | Status: AC
  Administered 2017-08-29: 10:00:00 via INTRAVENOUS

## 2017-08-29 MED ORDER — PALONOSETRON HCL INJECTION 0.25 MG/5ML
0.2500 mg | Freq: Once | INTRAVENOUS | Status: AC
  Administered 2017-08-29: 0.25 mg via INTRAVENOUS

## 2017-08-29 MED ORDER — HEPARIN SOD (PORK) LOCK FLUSH 100 UNIT/ML IV SOLN
500.0000 [IU] | Freq: Once | INTRAVENOUS | Status: AC | PRN
  Administered 2017-08-29: 500 [IU]
  Filled 2017-08-29: qty 5

## 2017-08-29 MED ORDER — SODIUM CHLORIDE 0.9% FLUSH
10.0000 mL | INTRAVENOUS | Status: DC | PRN
  Administered 2017-08-29: 10 mL
  Filled 2017-08-29: qty 10

## 2017-08-29 MED ORDER — PALONOSETRON HCL INJECTION 0.25 MG/5ML
INTRAVENOUS | Status: AC
  Filled 2017-08-29: qty 5

## 2017-08-29 NOTE — Progress Notes (Signed)
Reviewed pt labs (CBC) with Dr. Marin Olp and pt ok to treat with ANC 1.1

## 2017-08-29 NOTE — Progress Notes (Signed)
Hematology and Oncology Follow Up Visit  Barbara Cook [id number] 07/11/1955 62 y.o. 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  She is doing fairly well.  She has some chronic back issues.  We have looked at this before.  I know that she has some wear and tear in the back.  She is worried about osteoporosis.  She would like to see her neurosurgeon.  We will go ahead and see if Dr. Vertell Limber can see her.  So far, her myeloma studies continue to improve. When we last saw her, her myeloma studies continue to improve.  Her M spike was down to 0.9 g/dL.  Her IgG level was 1483 mg/dL.  Her kappa light chain was 1.9 mg/dL.  Thankfully, she did not have any diarrhea with her last treatment.  She and her husband had a nice Easter weekend.  She is had no fever.  There is been no bleeding.  She has had no leg swelling.  Overall, her performance status is ECOG 1.    Medications:  Allergies as of 08/29/2017      Reactions   Codeine Palpitations      Medication List        Accurate as of 08/29/17  8:36 AM. Always use your most recent med list.          acetaminophen 500 MG tablet Commonly known as:  TYLENOL Take 500 mg by mouth every 6 (six) hours as needed for mild pain or headache.   acyclovir 400 MG tablet Commonly known as:  ZOVIRAX Take 1 tablet (400 mg total) by mouth daily.   B-6 250 MG Tabs TAKE 1 TABLET (250 MG TOTAL) BY MOUTH DAILY.   benzonatate 100 MG capsule Commonly known as:  TESSALON PERLES Take 2 capsules (200 mg total) 3  (three) times daily as needed by mouth for cough.   diazepam 2 MG tablet Commonly known as:  VALIUM Take 2 tablets (4 mg total) by mouth every 8 (eight) hours as needed for anxiety.   hyoscyamine 0.125 MG SL tablet Commonly known as:  LEVSIN SL PLACE 1 TABLET (0.125 MG TOTAL) UNDER THE TONGUE EVERY 4 (FOUR) HOURS AS NEEDED.   magnesium oxide 400 (241.3 Mg) MG tablet Commonly known as:  MAG-OX TAKE 1 TABLET BY MOUTH TWICE A DAY   multivitamin tablet Take 1 tablet by mouth every evening.   ondansetron 8 MG tablet Commonly known as:  ZOFRAN TAKE 1 TABLET BY MOUTH TWICE A DAY FOR NAUSEA AND VOMITING 1 TABLET 1 HOUR PRIOR TO CHEMOTHERAPY   pantoprazole 40 MG tablet Commonly known as:  PROTONIX Take 1 tablet (40 mg total) by mouth daily.   polyethylene glycol powder powder Commonly known as:  GLYCOLAX/MIRALAX 1 capful daily as needed   prochlorperazine 10 MG tablet Commonly known as:  COMPAZINE Take 1 tablet (10 mg total) by mouth every 6 (six) hours as needed for nausea or vomiting.   torsemide 20 MG tablet Commonly known as:  DEMADEX Take 1 tablet (20 mg total) by mouth  daily as needed.   traMADol 50 MG tablet Commonly known as:  ULTRAM Take 1 tablet (50 mg total) every 6 (six) hours as needed by mouth.   Vitamin D (Ergocalciferol) 50000 units Caps capsule Commonly known as:  DRISDOL TAKE 1 CAPSULE (50,000 UNITS TOTAL) BY MOUTH ONCE A WEEK.       Allergies:  Allergies  Allergen Reactions  . Codeine Palpitations    Past Medical History, Surgical history, Social history, and Family History were reviewed and updated.  Review of Systems: Review of Systems  Constitutional: Negative.   HENT: Negative.   Eyes: Negative.   Respiratory: Negative.   Cardiovascular: Negative.   Gastrointestinal: Negative.   Genitourinary: Negative.   Musculoskeletal: Negative.   Skin: Negative.   Neurological: Negative.   Endo/Heme/Allergies: Negative.   Psychiatric/Behavioral:  Negative.     Physical Exam:  weight is 165 lb (74.8 kg). Her oral temperature is 97.8 F (36.6 C). Her blood pressure is 119/63 and her pulse is 71. Her respiration is 17 and oxygen saturation is 100%.   Wt Readings from Last 3 Encounters:  08/29/17 165 lb (74.8 kg)  08/15/17 167 lb (75.8 kg)  07/18/17 165 lb 0.3 oz (74.9 kg)    Physical Exam  Constitutional: She is oriented to person, place, and time.  HENT:  Head: Normocephalic and atraumatic.  Mouth/Throat: Oropharynx is clear and moist.  Eyes: Pupils are equal, round, and reactive to light. EOM are normal.  Neck: Normal range of motion.  Cardiovascular: Normal rate, regular rhythm and normal heart sounds.  Pulmonary/Chest: Effort normal and breath sounds normal.  Abdominal: Soft. Bowel sounds are normal.  Musculoskeletal: Normal range of motion. She exhibits no edema, tenderness or deformity.  Lymphadenopathy:    She has no cervical adenopathy.  Neurological: She is alert and oriented to person, place, and time.  Skin: Skin is warm and dry. No rash noted. No erythema.  Psychiatric: She has a normal mood and affect. Her behavior is normal. Judgment and thought content normal.  Vitals reviewed.         Lab Results  Component Value Date   WBC 2.2 (L) 08/29/2017   HGB 10.0 (L) 05/09/2017   HCT 30.7 (L) 08/29/2017   MCV 92.2 08/29/2017   PLT 125 (L) 08/29/2017   Lab Results  Component Value Date   FERRITIN 383 (H) 08/15/2017   IRON 67 08/15/2017   TIBC 250 08/15/2017   UIBC 183 08/15/2017   IRONPCTSAT 27 08/15/2017   Lab Results  Component Value Date   RETICCTPCT 1.1 07/18/2017   RBC 3.33 (L) 08/29/2017   RETICCTABS 35.9 12/25/2014   Lab Results  Component Value Date   KPAFRELGTCHN 19.7 (H) 08/15/2017   LAMBDASER 8.1 08/15/2017   KAPLAMBRATIO 2.43 (H) 08/15/2017   Lab Results  Component Value Date   IGGSERUM 1,483 08/15/2017   IGGSERUM 1,514 08/15/2017   IGA 29 (L) 08/15/2017   IGA 28 (L) 08/15/2017     IGMSERUM 12 (L) 08/15/2017   IGMSERUM 12 (L) 08/15/2017   Lab Results  Component Value Date   TOTALPROTELP 6.8 08/15/2017   ALBUMINELP 3.8 08/15/2017   A1GS 0.2 08/15/2017   A2GS 0.5 08/15/2017   BETS 0.9 08/15/2017   BETA2SER 0.3 05/07/2015   GAMS 1.4 08/15/2017   MSPIKE 0.9 (H) 08/15/2017   SPEI * 05/07/2015     Chemistry      Component Value Date/Time   NA 145 08/15/2017 0900   NA 143 05/09/2017 4709  NA 139 02/20/2016 1207   K 3.9 08/15/2017 0900   K 4.0 05/09/2017 0812   K 3.8 02/20/2016 1207   CL 108 08/15/2017 0900   CL 108 05/09/2017 0812   CO2 26 08/15/2017 0900   CO2 26 05/09/2017 0812   CO2 29 02/20/2016 1207   BUN 20 08/15/2017 0900   BUN 13 05/09/2017 0812   BUN 22.3 02/20/2016 1207   CREATININE 0.80 08/15/2017 0900   CREATININE 0.9 05/09/2017 0812   CREATININE 1.3 (H) 02/20/2016 1207      Component Value Date/Time   CALCIUM 9.1 08/15/2017 0900   CALCIUM 8.3 05/09/2017 0812   CALCIUM 9.7 02/20/2016 1207   ALKPHOS 64 08/15/2017 0900   ALKPHOS 123 (H) 05/09/2017 0812   ALKPHOS 62 02/20/2016 1207   AST 24 08/15/2017 0900   AST 27 02/20/2016 1207   ALT 18 08/15/2017 0900   ALT 26 05/09/2017 0812   ALT 19 02/20/2016 1207   BILITOT 0.6 08/15/2017 0900   BILITOT 0.44 02/20/2016 1207      Impression and Plan: Barbara Cook is a very pleasant 62 yo African American female with IgG Kappa myeloma.   I am going to go ahead and treat her.  Her white cell count is borderline.  I am going to keep readjusting her dose of Cytoxan.  Hopefully, treatments every 2 weeks will help with her blood counts.  We will plan to see her back in another 2 weeks.  [name], MD 4/22/20198:36 AM

## 2017-08-29 NOTE — Patient Instructions (Signed)
Implanted Port Home Guide An implanted port is a type of central line that is placed under the skin. Central lines are used to provide IV access when treatment or nutrition needs to be given through a person's veins. Implanted ports are used for long-term IV access. An implanted port may be placed because:  You need IV medicine that would be irritating to the small veins in your hands or arms.  You need long-term IV medicines, such as antibiotics.  You need IV nutrition for a long period.  You need frequent blood draws for lab tests.  You need dialysis.  Implanted ports are usually placed in the chest area, but they can also be placed in the upper arm, the abdomen, or the leg. An implanted port has two main parts:  Reservoir. The reservoir is round and will appear as a small, raised area under your skin. The reservoir is the part where a needle is inserted to give medicines or draw blood.  Catheter. The catheter is a thin, flexible tube that extends from the reservoir. The catheter is placed into a large vein. Medicine that is inserted into the reservoir goes into the catheter and then into the vein.  How will I care for my incision site? Do not get the incision site wet. Bathe or shower as directed by your health care provider. How is my port accessed? Special steps must be taken to access the port:  Before the port is accessed, a numbing cream can be placed on the skin. This helps numb the skin over the port site.  Your health care provider uses a sterile technique to access the port. ? Your health care provider must put on a mask and sterile gloves. ? The skin over your port is cleaned carefully with an antiseptic and allowed to dry. ? The port is gently pinched between sterile gloves, and a needle is inserted into the port.  Only "non-coring" port needles should be used to access the port. Once the port is accessed, a blood return should be checked. This helps ensure that the port  is in the vein and is not clogged.  If your port needs to remain accessed for a constant infusion, a clear (transparent) bandage will be placed over the needle site. The bandage and needle will need to be changed every week, or as directed by your health care provider.  Keep the bandage covering the needle clean and dry. Do not get it wet. Follow your health care provider's instructions on how to take a shower or bath while the port is accessed.  If your port does not need to stay accessed, no bandage is needed over the port.  What is flushing? Flushing helps keep the port from getting clogged. Follow your health care provider's instructions on how and when to flush the port. Ports are usually flushed with saline solution or a medicine called heparin. The need for flushing will depend on how the port is used.  If the port is used for intermittent medicines or blood draws, the port will need to be flushed: ? After medicines have been given. ? After blood has been drawn. ? As part of routine maintenance.  If a constant infusion is running, the port may not need to be flushed.  How long will my port stay implanted? The port can stay in for as long as your health care provider thinks it is needed. When it is time for the port to come out, surgery will be   done to remove it. The procedure is similar to the one performed when the port was put in. When should I seek immediate medical care? When you have an implanted port, you should seek immediate medical care if:  You notice a bad smell coming from the incision site.  You have swelling, redness, or drainage at the incision site.  You have more swelling or pain at the port site or the surrounding area.  You have a fever that is not controlled with medicine.  This information is not intended to replace advice given to you by your health care provider. Make sure you discuss any questions you have with your health care provider. Document  Released: 04/26/2005 Document Revised: 10/02/2015 Document Reviewed: 01/01/2013 Elsevier Interactive Patient Education  2017 [employer]  

## 2017-08-29 NOTE — Patient Instructions (Addendum)
[facility] Discharge Instructions for Patients Receiving Chemotherapy  Today you received the following chemotherapy agents Kyprolis and Cytoxan  To help prevent nausea and vomiting after your treatment, we encourage you to take your nausea medication ; NO ZOFRAN FOR 3 DAYS.   If you develop nausea and vomiting that is not controlled by your nausea medication, call the clinic.   BELOW ARE SYMPTOMS THAT SHOULD BE REPORTED IMMEDIATELY:  *FEVER GREATER THAN 100.5 F  *CHILLS WITH OR WITHOUT FEVER  NAUSEA AND VOMITING THAT IS NOT CONTROLLED WITH YOUR NAUSEA MEDICATION  *UNUSUAL SHORTNESS OF BREATH  *UNUSUAL BRUISING OR BLEEDING  TENDERNESS IN MOUTH AND THROAT WITH OR WITHOUT PRESENCE OF ULCERS  *URINARY PROBLEMS  *BOWEL PROBLEMS  UNUSUAL RASH Items with * indicate a potential emergency and should be followed up as soon as possible.  Feel free to call the clinic should you have any questions or concerns. The clinic phone number is [phone].  Please show the [place] at check-in to the Emergency Department and triage nurse.

## 2017-08-30 ENCOUNTER — Inpatient Hospital Stay: Payer: 59

## 2017-08-30 DIAGNOSIS — Z5112 Encounter for antineoplastic immunotherapy: Secondary | ICD-10-CM | POA: Diagnosis not present

## 2017-08-30 DIAGNOSIS — C9000 Multiple myeloma not having achieved remission: Secondary | ICD-10-CM

## 2017-08-30 DIAGNOSIS — C9002 Multiple myeloma in relapse: Secondary | ICD-10-CM

## 2017-08-30 LAB — IGG, IGA, IGM
IGG (IMMUNOGLOBIN G), SERUM: 1423 mg/dL (ref 700–1600)
IgA: 27 mg/dL — ABNORMAL LOW (ref 87–352)
IgM (Immunoglobulin M), Srm: 11 mg/dL — ABNORMAL LOW (ref 26–217)

## 2017-08-30 LAB — KAPPA/LAMBDA LIGHT CHAINS
Kappa free light chain: 17.6 mg/L (ref 3.3–19.4)
Kappa, lambda light chain ratio: 2.35 — ABNORMAL HIGH (ref 0.26–1.65)
LAMDA FREE LIGHT CHAINS: 7.5 mg/L (ref 5.7–26.3)

## 2017-08-30 MED ORDER — SODIUM CHLORIDE 0.9 % IV SOLN
Freq: Once | INTRAVENOUS | Status: AC
  Administered 2017-08-30: 09:00:00 via INTRAVENOUS

## 2017-08-30 MED ORDER — SODIUM CHLORIDE 0.9% FLUSH
10.0000 mL | INTRAVENOUS | Status: DC | PRN
Start: 2017-08-30 — End: 2017-08-30
  Administered 2017-08-30: 10 mL
  Filled 2017-08-30: qty 10

## 2017-08-30 MED ORDER — DEXAMETHASONE SODIUM PHOSPHATE 10 MG/ML IJ SOLN
10.0000 mg | Freq: Once | INTRAMUSCULAR | Status: AC
  Administered 2017-08-30: 10 mg via INTRAVENOUS

## 2017-08-30 MED ORDER — HEPARIN SOD (PORK) LOCK FLUSH 100 UNIT/ML IV SOLN
500.0000 [IU] | Freq: Once | INTRAVENOUS | Status: AC | PRN
  Administered 2017-08-30: 500 [IU]
  Filled 2017-08-30: qty 5

## 2017-08-30 MED ORDER — DEXTROSE 5 % IV SOLN
37.0000 mg/m2 | Freq: Once | INTRAVENOUS | Status: AC
  Administered 2017-08-30: 70 mg via INTRAVENOUS
  Filled 2017-08-30: qty 30

## 2017-08-30 MED ORDER — DEXAMETHASONE SODIUM PHOSPHATE 10 MG/ML IJ SOLN
INTRAMUSCULAR | Status: AC
  Filled 2017-08-30: qty 1

## 2017-08-30 NOTE — Patient Instructions (Signed)
[facility] Discharge Instructions for Patients Receiving Chemotherapy  Today you received the following chemotherapy agents Kyprolis  To help prevent nausea and vomiting after your treatment, we encourage you to take your nausea medication ; NO ZOFRAN FOR 3 DAYS.   If you develop nausea and vomiting that is not controlled by your nausea medication, call the clinic.   BELOW ARE SYMPTOMS THAT SHOULD BE REPORTED IMMEDIATELY:  *FEVER GREATER THAN 100.5 F  *CHILLS WITH OR WITHOUT FEVER  NAUSEA AND VOMITING THAT IS NOT CONTROLLED WITH YOUR NAUSEA MEDICATION  *UNUSUAL SHORTNESS OF BREATH  *UNUSUAL BRUISING OR BLEEDING  TENDERNESS IN MOUTH AND THROAT WITH OR WITHOUT PRESENCE OF ULCERS  *URINARY PROBLEMS  *BOWEL PROBLEMS  UNUSUAL RASH Items with * indicate a potential emergency and should be followed up as soon as possible.  Feel free to call the clinic should you have any questions or concerns. The clinic phone number is [phone].  Please show the [place] at check-in to the Emergency Department and triage nurse.

## 2017-09-01 LAB — PROTEIN ELECTROPHORESIS, SERUM, WITH REFLEX
A/G RATIO SPE: 1.2 (ref 0.7–1.7)
ALPHA-2-GLOBULIN: 0.6 g/dL (ref 0.4–1.0)
Albumin ELP: 3.6 g/dL (ref 2.9–4.4)
Alpha-1-Globulin: 0.2 g/dL (ref 0.0–0.4)
Beta Globulin: 0.9 g/dL (ref 0.7–1.3)
GLOBULIN, TOTAL: 3.1 g/dL (ref 2.2–3.9)
Gamma Globulin: 1.4 g/dL (ref 0.4–1.8)
M-SPIKE, %: 1 g/dL — AB
SPEP INTERP: 0
TOTAL PROTEIN ELP: 6.7 g/dL (ref 6.0–8.5)

## 2017-09-01 LAB — IMMUNOFIXATION REFLEX, SERUM
IGA: 25 mg/dL — AB (ref 87–352)
IGG (IMMUNOGLOBIN G), SERUM: 1448 mg/dL (ref 700–1600)
IgM (Immunoglobulin M), Srm: 11 mg/dL — ABNORMAL LOW (ref 26–217)

## 2017-09-02 ENCOUNTER — Telehealth: Payer: Self-pay | Admitting: *Deleted

## 2017-09-02 NOTE — Telephone Encounter (Addendum)
Patient is aware of results.   ----- Message from [name], MD sent at 09/01/2017  4:40 PM EDT ----- Call - myeloma is stable at 1.0 mg/dL.  This is ok!!  Film/video editor

## 2017-09-05 ENCOUNTER — Inpatient Hospital Stay: Payer: 59

## 2017-09-05 ENCOUNTER — Ambulatory Visit (HOSPITAL_BASED_OUTPATIENT_CLINIC_OR_DEPARTMENT_OTHER)
Admission: RE | Admit: 2017-09-05 | Discharge: 2017-09-05 | Disposition: A | Discharge status: Home / Self Care | Payer: 59 | Source: Ambulatory Visit | Attending: Hematology & Oncology | Admitting: Hematology & Oncology

## 2017-09-05 VITALS — BP 144/64 | HR 74 | Temp 97.7°F | Resp 20

## 2017-09-05 DIAGNOSIS — M818 Other osteoporosis without current pathological fracture: Secondary | ICD-10-CM

## 2017-09-05 DIAGNOSIS — C9000 Multiple myeloma not having achieved remission: Secondary | ICD-10-CM

## 2017-09-05 DIAGNOSIS — Z780 Asymptomatic menopausal state: Secondary | ICD-10-CM | POA: Diagnosis not present

## 2017-09-05 DIAGNOSIS — Z13820 Encounter for screening for osteoporosis: Secondary | ICD-10-CM | POA: Diagnosis not present

## 2017-09-05 DIAGNOSIS — M899 Disorder of bone, unspecified: Secondary | ICD-10-CM

## 2017-09-05 DIAGNOSIS — Z5112 Encounter for antineoplastic immunotherapy: Secondary | ICD-10-CM | POA: Diagnosis not present

## 2017-09-05 MED ORDER — HEPARIN SOD (PORK) LOCK FLUSH 100 UNIT/ML IV SOLN
500.0000 [IU] | Freq: Once | INTRAVENOUS | Status: AC
  Administered 2017-09-05: 500 [IU] via INTRAVENOUS
  Filled 2017-09-05: qty 5

## 2017-09-05 MED ORDER — SODIUM CHLORIDE 0.9 % IV SOLN
INTRAVENOUS | Status: DC
  Administered 2017-09-05: 08:00:00 via INTRAVENOUS

## 2017-09-05 MED ORDER — ZOLEDRONIC ACID 4 MG/100ML IV SOLN
4.0000 mg | Freq: Once | INTRAVENOUS | Status: AC
  Administered 2017-09-05: 4 mg via INTRAVENOUS
  Filled 2017-09-05: qty 100

## 2017-09-05 MED ORDER — SODIUM CHLORIDE 0.9% FLUSH
10.0000 mL | INTRAVENOUS | Status: DC | PRN
  Administered 2017-09-05: 10 mL via INTRAVENOUS
  Filled 2017-09-05: qty 10

## 2017-09-05 NOTE — Patient Instructions (Signed)

## 2017-09-06 ENCOUNTER — Telehealth: Payer: Self-pay | Admitting: *Deleted

## 2017-09-06 NOTE — Telephone Encounter (Addendum)
Patient is aware of results.  ----- Message from [name], MD sent at 09/05/2017  3:37 PM EDT ----- Call - bone density is normal!!  [name]

## 2017-09-12 ENCOUNTER — Inpatient Hospital Stay: Payer: 59 | Attending: Hematology & Oncology | Admitting: Hematology & Oncology

## 2017-09-12 ENCOUNTER — Inpatient Hospital Stay: Payer: 59

## 2017-09-12 ENCOUNTER — Other Ambulatory Visit: Payer: Self-pay

## 2017-09-12 ENCOUNTER — Encounter: Payer: Self-pay | Admitting: Hematology & Oncology

## 2017-09-12 VITALS — BP 141/72 | HR 74 | Temp 97.6°F | Resp 18 | Wt 169.0 lb

## 2017-09-12 DIAGNOSIS — C9002 Multiple myeloma in relapse: Secondary | ICD-10-CM

## 2017-09-12 DIAGNOSIS — C9000 Multiple myeloma not having achieved remission: Secondary | ICD-10-CM

## 2017-09-12 DIAGNOSIS — Z5112 Encounter for antineoplastic immunotherapy: Secondary | ICD-10-CM | POA: Insufficient documentation

## 2017-09-12 DIAGNOSIS — Z5111 Encounter for antineoplastic chemotherapy: Secondary | ICD-10-CM | POA: Insufficient documentation

## 2017-09-12 DIAGNOSIS — G8929 Other chronic pain: Secondary | ICD-10-CM

## 2017-09-12 DIAGNOSIS — M545 Low back pain: Secondary | ICD-10-CM

## 2017-09-12 LAB — CBC WITH DIFFERENTIAL (CANCER CENTER ONLY)
BASOS PCT: 1 %
Basophils Absolute: 0 10*3/uL (ref 0.0–0.1)
Eosinophils Absolute: 0.1 10*3/uL (ref 0.0–0.5)
Eosinophils Relative: 3 %
HCT: 29.8 % — ABNORMAL LOW (ref 34.8–46.6)
HEMOGLOBIN: 10 g/dL — AB (ref 11.6–15.9)
LYMPHS ABS: 0.4 10*3/uL — AB (ref 0.9–3.3)
LYMPHS PCT: 23 %
MCH: 30.9 pg (ref 26.0–34.0)
MCHC: 33.6 g/dL (ref 32.0–36.0)
MCV: 92 fL (ref 81.0–101.0)
MONOS PCT: 19 %
Monocytes Absolute: 0.3 10*3/uL (ref 0.1–0.9)
NEUTROS ABS: 1 10*3/uL — AB (ref 1.5–6.5)
NEUTROS PCT: 54 %
Platelet Count: 122 10*3/uL — ABNORMAL LOW (ref 145–400)
RBC: 3.24 MIL/uL — ABNORMAL LOW (ref 3.70–5.32)
RDW: 13.1 % (ref 11.1–15.7)
WBC Count: 1.8 10*3/uL — ABNORMAL LOW (ref 3.9–10.0)

## 2017-09-12 LAB — CMP (CANCER CENTER ONLY)
ALBUMIN: 3.7 g/dL (ref 3.5–5.0)
ALT: 32 U/L (ref 10–47)
AST: 27 U/L (ref 11–38)
Alkaline Phosphatase: 60 U/L (ref 26–84)
Anion gap: 8 (ref 5–15)
BILIRUBIN TOTAL: 0.6 mg/dL (ref 0.2–1.6)
BUN: 14 mg/dL (ref 7–22)
CHLORIDE: 110 mmol/L — AB (ref 98–108)
CO2: 26 mmol/L (ref 18–33)
CREATININE: 1.2 mg/dL (ref 0.60–1.20)
Calcium: 9.3 mg/dL (ref 8.0–10.3)
Glucose, Bld: 91 mg/dL (ref 73–118)
Potassium: 4.4 mmol/L (ref 3.3–4.7)
Sodium: 144 mmol/L (ref 128–145)
Total Protein: 6.8 g/dL (ref 6.4–8.1)

## 2017-09-12 LAB — LACTATE DEHYDROGENASE: LDH: 221 U/L (ref 125–245)

## 2017-09-12 MED ORDER — HEPARIN SOD (PORK) LOCK FLUSH 100 UNIT/ML IV SOLN
500.0000 [IU] | Freq: Once | INTRAVENOUS | Status: AC | PRN
  Administered 2017-09-12: 500 [IU]
  Filled 2017-09-12: qty 5

## 2017-09-12 MED ORDER — DEXAMETHASONE SODIUM PHOSPHATE 10 MG/ML IJ SOLN
INTRAMUSCULAR | Status: AC
  Filled 2017-09-12: qty 1

## 2017-09-12 MED ORDER — PALONOSETRON HCL INJECTION 0.25 MG/5ML
INTRAVENOUS | Status: AC
  Filled 2017-09-12: qty 5

## 2017-09-12 MED ORDER — SODIUM CHLORIDE 0.9 % IV SOLN
Freq: Once | INTRAVENOUS | Status: AC
  Administered 2017-09-12: 10:00:00 via INTRAVENOUS

## 2017-09-12 MED ORDER — PALONOSETRON HCL INJECTION 0.25 MG/5ML
0.2500 mg | Freq: Once | INTRAVENOUS | Status: AC
  Administered 2017-09-12: 0.25 mg via INTRAVENOUS

## 2017-09-12 MED ORDER — DEXTROSE 5 % IV SOLN
37.0000 mg/m2 | Freq: Once | INTRAVENOUS | Status: AC
  Administered 2017-09-12: 70 mg via INTRAVENOUS
  Filled 2017-09-12: qty 5

## 2017-09-12 MED ORDER — DEXAMETHASONE SODIUM PHOSPHATE 10 MG/ML IJ SOLN
10.0000 mg | Freq: Once | INTRAMUSCULAR | Status: AC
  Administered 2017-09-12: 10 mg via INTRAVENOUS

## 2017-09-12 MED ORDER — SODIUM CHLORIDE 0.9 % IV SOLN
180.0000 mg/m2 | Freq: Once | INTRAVENOUS | Status: AC
  Administered 2017-09-12: 340 mg via INTRAVENOUS
  Filled 2017-09-12: qty 17

## 2017-09-12 MED ORDER — SODIUM CHLORIDE 0.9% FLUSH
10.0000 mL | INTRAVENOUS | Status: DC | PRN
  Administered 2017-09-12: 10 mL
  Filled 2017-09-12: qty 10

## 2017-09-12 NOTE — Progress Notes (Signed)
Hematology and Oncology Follow Up Visit  Barbara Cook [id number] 1956-01-30 62 y.o. 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  She is doing fairly well.  She has some chronic back issues.  We have looked at this before.  I know that she has some wear and tear in the back.  She is worried about osteoporosis.  She would like to see her neurosurgeon.  We will go ahead and see if Dr. Vertell Limber can see her.  So far, her myeloma studies continue to improve. When we last saw her, her myeloma studies continue to improve.  Her M spike was down to 1.0 g/dL.  Her IgG level was 1448 mg/dL.  Her kappa light chain was 1.8 mg/dL.  Thankfully, she did not have any diarrhea with her last treatment.  She is had no fever.  There is been no bleeding.  She has had no leg swelling.  Overall, her performance status is ECOG 1.    Medications:  Allergies as of 09/12/2017      Reactions   Codeine Palpitations      Medication List        Accurate as of 09/12/17  9:07 AM. Always use your most recent med list.          acetaminophen 500 MG tablet Commonly known as:  TYLENOL Take 500 mg by mouth every 6 (six) hours as needed for mild pain or headache.   acyclovir 400 MG tablet Commonly known as:  ZOVIRAX Take 1 tablet (400 mg total) by mouth daily.   B-6 250 MG Tabs TAKE 1 TABLET (250 MG TOTAL) BY MOUTH DAILY.   benzonatate 100 MG capsule Commonly known as:  TESSALON PERLES Take 2 capsules (200 mg total) 3 (three) times daily as needed by mouth for cough.     diazepam 2 MG tablet Commonly known as:  VALIUM Take 2 tablets (4 mg total) by mouth every 8 (eight) hours as needed for anxiety.   hyoscyamine 0.125 MG SL tablet Commonly known as:  LEVSIN SL PLACE 1 TABLET (0.125 MG TOTAL) UNDER THE TONGUE EVERY 4 (FOUR) HOURS AS NEEDED.   magnesium oxide 400 (241.3 Mg) MG tablet Commonly known as:  MAG-OX TAKE 1 TABLET BY MOUTH TWICE A DAY   multivitamin tablet Take 1 tablet by mouth every evening.   ondansetron 8 MG tablet Commonly known as:  ZOFRAN TAKE 1 TABLET BY MOUTH TWICE A DAY FOR NAUSEA AND VOMITING 1 TABLET 1 HOUR PRIOR TO CHEMOTHERAPY   pantoprazole 40 MG tablet Commonly known as:  PROTONIX Take 1 tablet (40 mg total) by mouth daily.   polyethylene glycol powder powder Commonly known as:  GLYCOLAX/MIRALAX 1 capful daily as needed   prochlorperazine 10 MG tablet Commonly known as:  COMPAZINE Take 1 tablet (10 mg total) by mouth every 6 (six) hours as needed for nausea or vomiting.   torsemide 20 MG tablet Commonly known as:  DEMADEX Take 1 tablet (20 mg total) by mouth daily as needed.   traMADol 50 MG tablet  Commonly known as:  ULTRAM Take 1 tablet (50 mg total) every 6 (six) hours as needed by mouth.   Vitamin D (Ergocalciferol) 50000 units Caps capsule Commonly known as:  DRISDOL TAKE 1 CAPSULE (50,000 UNITS TOTAL) BY MOUTH ONCE A WEEK.       Allergies:  Allergies  Allergen Reactions  . Codeine Palpitations    Past Medical History, Surgical history, Social history, and Family History were reviewed and updated.  Review of Systems: Review of Systems  Constitutional: Negative.   HENT: Negative.   Eyes: Negative.   Respiratory: Negative.   Cardiovascular: Negative.   Gastrointestinal: Negative.   Genitourinary: Negative.   Musculoskeletal: Negative.   Skin: Negative.   Neurological: Negative.   Endo/Heme/Allergies: Negative.   Psychiatric/Behavioral: Negative.     Physical Exam:  weight is 169 lb  (76.7 kg). Her oral temperature is 97.6 F (36.4 C). Her blood pressure is 141/72 (abnormal) and her pulse is 74. Her respiration is 18 and oxygen saturation is 99%.   Wt Readings from Last 3 Encounters:  09/12/17 169 lb (76.7 kg)  08/29/17 165 lb (74.8 kg)  08/15/17 167 lb (75.8 kg)    Physical Exam  Constitutional: She is oriented to person, place, and time.  HENT:  Head: Normocephalic and atraumatic.  Mouth/Throat: Oropharynx is clear and moist.  Eyes: Pupils are equal, round, and reactive to light. EOM are normal.  Neck: Normal range of motion.  Cardiovascular: Normal rate, regular rhythm and normal heart sounds.  Pulmonary/Chest: Effort normal and breath sounds normal.  Abdominal: Soft. Bowel sounds are normal.  Musculoskeletal: Normal range of motion. She exhibits no edema, tenderness or deformity.  Lymphadenopathy:    She has no cervical adenopathy.  Neurological: She is alert and oriented to person, place, and time.  Skin: Skin is warm and dry. No rash noted. No erythema.  Psychiatric: She has a normal mood and affect. Her behavior is normal. Judgment and thought content normal.  Vitals reviewed.         Lab Results  Component Value Date   WBC 2.2 (L) 08/29/2017   HGB 10.3 (L) 08/29/2017   HCT 30.7 (L) 08/29/2017   MCV 92.2 08/29/2017   PLT 125 (L) 08/29/2017   Lab Results  Component Value Date   FERRITIN 1,083 (H) 08/29/2017   IRON 72 08/29/2017   TIBC 251 08/29/2017   UIBC 179 08/29/2017   IRONPCTSAT 29 08/29/2017   Lab Results  Component Value Date   RETICCTPCT 1.1 07/18/2017   RBC 3.33 (L) 08/29/2017   RETICCTABS 35.9 12/25/2014   Lab Results  Component Value Date   KPAFRELGTCHN 17.6 08/29/2017   LAMBDASER 7.5 08/29/2017   KAPLAMBRATIO 2.35 (H) 08/29/2017   Lab Results  Component Value Date   IGGSERUM 1,423 08/29/2017   IGGSERUM 1,448 08/29/2017   IGA 27 (L) 08/29/2017   IGA 25 (L) 08/29/2017   IGMSERUM 11 (L) 08/29/2017   IGMSERUM 11 (L)  08/29/2017   Lab Results  Component Value Date   TOTALPROTELP 6.7 08/29/2017   ALBUMINELP 3.6 08/29/2017   A1GS 0.2 08/29/2017   A2GS 0.6 08/29/2017   BETS 0.9 08/29/2017   BETA2SER 0.3 05/07/2015   GAMS 1.4 08/29/2017   MSPIKE 1.0 (H) 08/29/2017   SPEI * 05/07/2015     Chemistry      Component Value Date/Time   NA 146 (H) 08/29/2017 0810   NA 143 05/09/2017 0812   NA 139 02/20/2016 1207   K 4.0 08/29/2017 0810  K 4.0 05/09/2017 0812   K 3.8 02/20/2016 1207   CL 111 (H) 08/29/2017 0810   CL 108 05/09/2017 0812   CO2 28 08/29/2017 0810   CO2 26 05/09/2017 0812   CO2 29 02/20/2016 1207   BUN 22 08/29/2017 0810   BUN 13 05/09/2017 0812   BUN 22.3 02/20/2016 1207   CREATININE 1.00 08/29/2017 0810   CREATININE 0.9 05/09/2017 0812   CREATININE 1.3 (H) 02/20/2016 1207      Component Value Date/Time   CALCIUM 9.2 08/29/2017 0810   CALCIUM 8.3 05/09/2017 0812   CALCIUM 9.7 02/20/2016 1207   ALKPHOS 68 08/29/2017 0810   ALKPHOS 123 (H) 05/09/2017 0812   ALKPHOS 62 02/20/2016 1207   AST 24 08/29/2017 0810   AST 27 02/20/2016 1207   ALT 26 08/29/2017 0810   ALT 26 05/09/2017 0812   ALT 19 02/20/2016 1207   BILITOT 0.6 08/29/2017 0810   BILITOT 0.44 02/20/2016 1207      Impression and Plan: Ms. Weltman is a very pleasant 62 yo African American female with IgG Kappa myeloma.   I am going to go ahead and treat her.  Her white cell count is borderline.  I am going to keep readjusting her dose of Cytoxan.  Hopefully, treatments every 2 weeks will help with her blood counts.  We will plan to see her back in another 2 weeks.  [name], MD 5/6/20199:07 AM

## 2017-09-12 NOTE — Progress Notes (Signed)
OK to treat with today's lab values per MD

## 2017-09-12 NOTE — Patient Instructions (Signed)
Implanted Port Home Guide An implanted port is a type of central line that is placed under the skin. Central lines are used to provide IV access when treatment or nutrition needs to be given through a person's veins. Implanted ports are used for long-term IV access. An implanted port may be placed because:  You need IV medicine that would be irritating to the small veins in your hands or arms.  You need long-term IV medicines, such as antibiotics.  You need IV nutrition for a long period.  You need frequent blood draws for lab tests.  You need dialysis.  Implanted ports are usually placed in the chest area, but they can also be placed in the upper arm, the abdomen, or the leg. An implanted port has two main parts:  Reservoir. The reservoir is round and will appear as a small, raised area under your skin. The reservoir is the part where a needle is inserted to give medicines or draw blood.  Catheter. The catheter is a thin, flexible tube that extends from the reservoir. The catheter is placed into a large vein. Medicine that is inserted into the reservoir goes into the catheter and then into the vein.  How will I care for my incision site? Do not get the incision site wet. Bathe or shower as directed by your health care provider. How is my port accessed? Special steps must be taken to access the port:  Before the port is accessed, a numbing cream can be placed on the skin. This helps numb the skin over the port site.  Your health care provider uses a sterile technique to access the port. ? Your health care provider must put on a mask and sterile gloves. ? The skin over your port is cleaned carefully with an antiseptic and allowed to dry. ? The port is gently pinched between sterile gloves, and a needle is inserted into the port.  Only "non-coring" port needles should be used to access the port. Once the port is accessed, a blood return should be checked. This helps ensure that the port  is in the vein and is not clogged.  If your port needs to remain accessed for a constant infusion, a clear (transparent) bandage will be placed over the needle site. The bandage and needle will need to be changed every week, or as directed by your health care provider.  Keep the bandage covering the needle clean and dry. Do not get it wet. Follow your health care provider's instructions on how to take a shower or bath while the port is accessed.  If your port does not need to stay accessed, no bandage is needed over the port.  What is flushing? Flushing helps keep the port from getting clogged. Follow your health care provider's instructions on how and when to flush the port. Ports are usually flushed with saline solution or a medicine called heparin. The need for flushing will depend on how the port is used.  If the port is used for intermittent medicines or blood draws, the port will need to be flushed: ? After medicines have been given. ? After blood has been drawn. ? As part of routine maintenance.  If a constant infusion is running, the port may not need to be flushed.  How long will my port stay implanted? The port can stay in for as long as your health care provider thinks it is needed. When it is time for the port to come out, surgery will be   done to remove it. The procedure is similar to the one performed when the port was put in. When should I seek immediate medical care? When you have an implanted port, you should seek immediate medical care if:  You notice a bad smell coming from the incision site.  You have swelling, redness, or drainage at the incision site.  You have more swelling or pain at the port site or the surrounding area.  You have a fever that is not controlled with medicine.  This information is not intended to replace advice given to you by your health care provider. Make sure you discuss any questions you have with your health care provider. Document  Released: 04/26/2005 Document Revised: 10/02/2015 Document Reviewed: 01/01/2013 Elsevier Interactive Patient Education  2017 [employer]  

## 2017-09-12 NOTE — Patient Instructions (Signed)
[facility] Discharge Instructions for Patients Receiving Chemotherapy  Today you received the following chemotherapy agents Kyprolis and Cytoxan  To help prevent nausea and vomiting after your treatment, we encourage you to take your nausea medication ; NO ZOFRAN FOR 3 DAYS.   If you develop nausea and vomiting that is not controlled by your nausea medication, call the clinic.   BELOW ARE SYMPTOMS THAT SHOULD BE REPORTED IMMEDIATELY:  *FEVER GREATER THAN 100.5 F  *CHILLS WITH OR WITHOUT FEVER  NAUSEA AND VOMITING THAT IS NOT CONTROLLED WITH YOUR NAUSEA MEDICATION  *UNUSUAL SHORTNESS OF BREATH  *UNUSUAL BRUISING OR BLEEDING  TENDERNESS IN MOUTH AND THROAT WITH OR WITHOUT PRESENCE OF ULCERS  *URINARY PROBLEMS  *BOWEL PROBLEMS  UNUSUAL RASH Items with * indicate a potential emergency and should be followed up as soon as possible.  Feel free to call the clinic should you have any questions or concerns. The clinic phone number is [phone].  Please show the [place] at check-in to the Emergency Department and triage nurse.

## 2017-09-13 ENCOUNTER — Encounter: Payer: Self-pay | Admitting: Hematology & Oncology

## 2017-09-13 ENCOUNTER — Inpatient Hospital Stay: Payer: 59

## 2017-09-13 LAB — IGG, IGA, IGM
IGA: 23 mg/dL — AB (ref 87–352)
IgG (Immunoglobin G), Serum: 1316 mg/dL (ref 700–1600)
IgM (Immunoglobulin M), Srm: 11 mg/dL — ABNORMAL LOW (ref 26–217)

## 2017-09-13 LAB — KAPPA/LAMBDA LIGHT CHAINS
KAPPA FREE LGHT CHN: 12.7 mg/L (ref 3.3–19.4)
Kappa, lambda light chain ratio: 2.15 — ABNORMAL HIGH (ref 0.26–1.65)
LAMDA FREE LIGHT CHAINS: 5.9 mg/L (ref 5.7–26.3)

## 2017-09-14 ENCOUNTER — Inpatient Hospital Stay: Payer: 59

## 2017-09-14 DIAGNOSIS — C9002 Multiple myeloma in relapse: Secondary | ICD-10-CM

## 2017-09-14 DIAGNOSIS — C9000 Multiple myeloma not having achieved remission: Secondary | ICD-10-CM

## 2017-09-14 DIAGNOSIS — Z5112 Encounter for antineoplastic immunotherapy: Secondary | ICD-10-CM | POA: Diagnosis not present

## 2017-09-14 MED ORDER — SODIUM CHLORIDE 0.9 % IV SOLN
Freq: Once | INTRAVENOUS | Status: AC
  Administered 2017-09-14: 09:00:00 via INTRAVENOUS

## 2017-09-14 MED ORDER — DEXAMETHASONE SODIUM PHOSPHATE 10 MG/ML IJ SOLN
INTRAMUSCULAR | Status: AC
  Filled 2017-09-14: qty 1

## 2017-09-14 MED ORDER — SODIUM CHLORIDE 0.9% FLUSH
10.0000 mL | INTRAVENOUS | Status: DC | PRN
  Administered 2017-09-14: 10 mL
  Filled 2017-09-14: qty 10

## 2017-09-14 MED ORDER — HEPARIN SOD (PORK) LOCK FLUSH 100 UNIT/ML IV SOLN
500.0000 [IU] | Freq: Once | INTRAVENOUS | Status: AC | PRN
  Administered 2017-09-14: 500 [IU]
  Filled 2017-09-14: qty 5

## 2017-09-14 MED ORDER — DEXTROSE 5 % IV SOLN
37.0000 mg/m2 | Freq: Once | INTRAVENOUS | Status: AC
  Administered 2017-09-14: 70 mg via INTRAVENOUS
  Filled 2017-09-14: qty 30

## 2017-09-14 MED ORDER — SODIUM CHLORIDE 0.9 % IV SOLN: Freq: Once | INTRAVENOUS | Status: DC

## 2017-09-14 MED ORDER — DEXAMETHASONE SODIUM PHOSPHATE 10 MG/ML IJ SOLN
10.0000 mg | Freq: Once | INTRAMUSCULAR | Status: AC
  Administered 2017-09-14: 10 mg via INTRAVENOUS

## 2017-09-14 NOTE — Patient Instructions (Signed)
Carfilzomib injection What is this medicine? CARFILZOMIB (kar FILZ oh mib) targets a specific protein within cancer cells and stops the cancer cells from growing. It is used to treat multiple myeloma. This medicine may be used for other purposes; ask your health care provider or pharmacist if you have questions. COMMON BRAND NAME(S): KYPROLIS What should I tell my health care provider before I take this medicine? They need to know if you have any of these conditions: -heart disease -history of blood clots -irregular heartbeat -kidney disease -liver disease -lung or breathing disease -an unusual or allergic reaction to carfilzomib, or other medicines, foods, dyes, or preservatives -pregnant or trying to get pregnant -breast-feeding How should I use this medicine? This medicine is for injection or infusion into a vein. It is given by a health care professional in a hospital or clinic setting. Talk to your pediatrician regarding the use of this medicine in children. Special care may be needed. Overdosage: If you think you have taken too much of this medicine contact a poison control center or emergency room at once. NOTE: This medicine is only for you. Do not share this medicine with others. What if I miss a dose? It is important not to miss your dose. Call your doctor or health care professional if you are unable to keep an appointment. What may interact with this medicine? Interactions are not expected. Give your health care provider a list of all the medicines, herbs, non-prescription drugs, or dietary supplements you use. Also tell them if you smoke, drink alcohol, or use illegal drugs. Some items may interact with your medicine. This list may not describe all possible interactions. Give your health care provider a list of all the medicines, herbs, non-prescription drugs, or dietary supplements you use. Also tell them if you smoke, drink alcohol, or use illegal drugs. Some items may  interact with your medicine. What should I watch for while using this medicine? Your condition will be monitored carefully while you are receiving this medicine. Report any side effects. Continue your course of treatment even though you feel ill unless your doctor tells you to stop. You may need blood work done while you are taking this medicine. Do not become pregnant while taking this medicine or for at least 30 days after stopping it. Women should inform their doctor if they wish to become pregnant or think they might be pregnant. There is a potential for serious side effects to an unborn child. Men should not father a child while taking this medicine and for 90 days after stopping it. Talk to your health care professional or pharmacist for more information. Do not breast-feed an infant while taking this medicine. Check with your doctor or health care professional if you get an attack of severe diarrhea, nausea and vomiting, or if you sweat a lot. The loss of too much body fluid can make it dangerous for you to take this medicine. You may get dizzy. Do not drive, use machinery, or do anything that needs mental alertness until you know how this medicine affects you. Do not stand or sit up quickly, especially if you are an older patient. This reduces the risk of dizzy or fainting spells. What side effects may I notice from receiving this medicine? Side effects that you should report to your doctor or health care professional as soon as possible: -allergic reactions like skin rash, itching or hives, swelling of the face, lips, or tongue -confusion -dizziness -feeling faint or lightheaded -fever or chills -  palpitations -seizures -signs and symptoms of bleeding such as bloody or black, tarry stools; red or dark-brown urine; spitting up blood or brown material that looks like coffee grounds; red spots on the skin; unusual bruising or bleeding including from the eye, gums, or nose -signs and symptoms of  a blood clot such as breathing problems; changes in vision; chest pain; severe, sudden headache; pain, swelling, warmth in the leg; trouble speaking; sudden numbness or weakness of the face, arm or leg -signs and symptoms of kidney injury like trouble passing urine or change in the amount of urine -signs and symptoms of liver injury like dark yellow or brown urine; general ill feeling or flu-like symptoms; light-colored stools; loss of appetite; nausea; right upper belly pain; unusually weak or tired; yellowing of the eyes or skin Side effects that usually do not require medical attention (report to your doctor or health care professional if they continue or are bothersome): -back pain -cough -diarrhea -headache -muscle cramps -vomiting This list may not describe all possible side effects. Call your doctor for medical advice about side effects. You may report side effects to FDA at 1-800-FDA-1088. Where should I keep my medicine? This drug is given in a hospital or clinic and will not be stored at home. NOTE: This sheet is a summary. It may not cover all possible information. If you have questions about this medicine, talk to your doctor, pharmacist, or health care provider.  2018 Elsevier/Gold Standard (2015-05-29 13:39:23)  

## 2017-09-15 LAB — PROTEIN ELECTROPHORESIS, SERUM, WITH REFLEX
A/G RATIO SPE: 1.4 (ref 0.7–1.7)
ALPHA-2-GLOBULIN: 0.6 g/dL (ref 0.4–1.0)
Albumin ELP: 3.7 g/dL (ref 2.9–4.4)
Alpha-1-Globulin: 0.2 g/dL (ref 0.0–0.4)
BETA GLOBULIN: 0.8 g/dL (ref 0.7–1.3)
GLOBULIN, TOTAL: 2.7 g/dL (ref 2.2–3.9)
Gamma Globulin: 1.1 g/dL (ref 0.4–1.8)
M-SPIKE, %: 0.7 g/dL — AB
SPEP INTERP: 0
Total Protein ELP: 6.4 g/dL (ref 6.0–8.5)

## 2017-09-15 LAB — IMMUNOFIXATION REFLEX, SERUM
IGM (IMMUNOGLOBULIN M), SRM: 11 mg/dL — AB (ref 26–217)
IgA: 23 mg/dL — ABNORMAL LOW (ref 87–352)
IgG (Immunoglobin G), Serum: 1275 mg/dL (ref 700–1600)

## 2017-09-16 ENCOUNTER — Encounter: Payer: Self-pay | Admitting: *Deleted

## 2017-09-16 DIAGNOSIS — C9000 Multiple myeloma not having achieved remission: Secondary | ICD-10-CM | POA: Diagnosis not present

## 2017-09-16 DIAGNOSIS — Z0000 Encounter for general adult medical examination without abnormal findings: Secondary | ICD-10-CM | POA: Diagnosis not present

## 2017-09-16 DIAGNOSIS — N390 Urinary tract infection, site not specified: Secondary | ICD-10-CM | POA: Diagnosis not present

## 2017-09-16 DIAGNOSIS — E559 Vitamin D deficiency, unspecified: Secondary | ICD-10-CM | POA: Diagnosis not present

## 2017-09-16 DIAGNOSIS — M353 Polymyalgia rheumatica: Secondary | ICD-10-CM | POA: Diagnosis not present

## 2017-09-20 ENCOUNTER — Other Ambulatory Visit: Payer: Self-pay | Admitting: Hematology & Oncology

## 2017-09-21 DIAGNOSIS — Z0001 Encounter for general adult medical examination with abnormal findings: Secondary | ICD-10-CM | POA: Diagnosis not present

## 2017-09-21 DIAGNOSIS — M2550 Pain in unspecified joint: Secondary | ICD-10-CM | POA: Diagnosis not present

## 2017-09-21 DIAGNOSIS — C9000 Multiple myeloma not having achieved remission: Secondary | ICD-10-CM | POA: Diagnosis not present

## 2017-09-26 ENCOUNTER — Inpatient Hospital Stay (HOSPITAL_BASED_OUTPATIENT_CLINIC_OR_DEPARTMENT_OTHER): Payer: 59 | Admitting: Family

## 2017-09-26 ENCOUNTER — Other Ambulatory Visit: Payer: Self-pay

## 2017-09-26 ENCOUNTER — Encounter: Payer: Self-pay | Admitting: Family

## 2017-09-26 ENCOUNTER — Inpatient Hospital Stay: Payer: 59

## 2017-09-26 VITALS — BP 125/63 | HR 66 | Temp 97.9°F | Resp 16 | Wt 169.0 lb

## 2017-09-26 DIAGNOSIS — C9000 Multiple myeloma not having achieved remission: Secondary | ICD-10-CM

## 2017-09-26 DIAGNOSIS — M545 Low back pain: Secondary | ICD-10-CM | POA: Diagnosis not present

## 2017-09-26 DIAGNOSIS — C9002 Multiple myeloma in relapse: Secondary | ICD-10-CM

## 2017-09-26 DIAGNOSIS — Z5112 Encounter for antineoplastic immunotherapy: Secondary | ICD-10-CM | POA: Diagnosis not present

## 2017-09-26 LAB — CBC WITH DIFFERENTIAL (CANCER CENTER ONLY)
BASOS ABS: 0 10*3/uL (ref 0.0–0.1)
Basophils Relative: 1 %
EOS ABS: 0.1 10*3/uL (ref 0.0–0.5)
EOS PCT: 4 %
HCT: 29.2 % — ABNORMAL LOW (ref 34.8–46.6)
HEMOGLOBIN: 9.8 g/dL — AB (ref 11.6–15.9)
LYMPHS ABS: 0.5 10*3/uL — AB (ref 0.9–3.3)
Lymphocytes Relative: 26 %
MCH: 30.9 pg (ref 26.0–34.0)
MCHC: 33.6 g/dL (ref 32.0–36.0)
MCV: 92.1 fL (ref 81.0–101.0)
Monocytes Absolute: 0.4 10*3/uL (ref 0.1–0.9)
Monocytes Relative: 21 %
Neutro Abs: 0.9 10*3/uL — ABNORMAL LOW (ref 1.5–6.5)
Neutrophils Relative %: 48 %
PLATELETS: 140 10*3/uL — AB (ref 145–400)
RBC: 3.17 MIL/uL — AB (ref 3.70–5.32)
RDW: 14 % (ref 11.1–15.7)
WBC: 1.9 10*3/uL — AB (ref 3.9–10.0)

## 2017-09-26 LAB — CMP (CANCER CENTER ONLY)
ALBUMIN: 3.7 g/dL (ref 3.5–5.0)
ALK PHOS: 59 U/L (ref 26–84)
ALT: 27 U/L (ref 10–47)
AST: 26 U/L (ref 11–38)
Anion gap: 7 (ref 5–15)
BUN: 20 mg/dL (ref 7–22)
CALCIUM: 8.4 mg/dL (ref 8.0–10.3)
CO2: 27 mmol/L (ref 18–33)
CREATININE: 1 mg/dL (ref 0.60–1.20)
Chloride: 109 mmol/L — ABNORMAL HIGH (ref 98–108)
GLUCOSE: 93 mg/dL (ref 73–118)
Potassium: 4 mmol/L (ref 3.3–4.7)
SODIUM: 143 mmol/L (ref 128–145)
TOTAL PROTEIN: 6.7 g/dL (ref 6.4–8.1)
Total Bilirubin: 0.7 mg/dL (ref 0.2–1.6)

## 2017-09-26 MED ORDER — SODIUM CHLORIDE 0.9 % IV SOLN: 180.0000 mg/m2 | Freq: Once | INTRAVENOUS | Status: DC

## 2017-09-26 MED ORDER — DEXAMETHASONE SODIUM PHOSPHATE 10 MG/ML IJ SOLN
INTRAMUSCULAR | Status: AC
  Filled 2017-09-26: qty 1

## 2017-09-26 MED ORDER — HEPARIN SOD (PORK) LOCK FLUSH 100 UNIT/ML IV SOLN
500.0000 [IU] | Freq: Once | INTRAVENOUS | Status: AC | PRN
  Administered 2017-09-26: 500 [IU]
  Filled 2017-09-26: qty 5

## 2017-09-26 MED ORDER — DEXTROSE 5 % IV SOLN: 36.0000 mg/m2 | Freq: Once | INTRAVENOUS | Status: DC

## 2017-09-26 MED ORDER — PALONOSETRON HCL INJECTION 0.25 MG/5ML: 0.2500 mg | Freq: Once | INTRAVENOUS | Status: DC

## 2017-09-26 MED ORDER — DEXAMETHASONE SODIUM PHOSPHATE 10 MG/ML IJ SOLN: 10.0000 mg | Freq: Once | INTRAMUSCULAR | Status: DC

## 2017-09-26 MED ORDER — SODIUM CHLORIDE 0.9 % IV SOLN: Freq: Once | INTRAVENOUS | Status: DC

## 2017-09-26 MED ORDER — SODIUM CHLORIDE 0.9% FLUSH
10.0000 mL | INTRAVENOUS | Status: DC | PRN
  Administered 2017-09-26: 10 mL
  Filled 2017-09-26: qty 10

## 2017-09-26 MED ORDER — PALONOSETRON HCL INJECTION 0.25 MG/5ML
INTRAVENOUS | Status: AC
  Filled 2017-09-26: qty 5

## 2017-09-26 NOTE — Progress Notes (Addendum)
Hematology and Oncology Follow Up Visit  ADRIE PICKING [id number] [birth year] 62 y.o. 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 Hiott is here today with her husband for follow-up and treatment. She is doing well but still has some lower and mid back pain off and on. She denies pain at this time.  Earlier this month her M-spike was 0.7, IgG level 1,316 mg/dL and kappa light chain 1.27 mg/dL.  No fever, chills, n/v, cough, rash, dizziness, SOB, chest pain, palpitations, abdominal pain or changes in bowel or bladder habits.  No bowel or bladder incontinence.  No weakness or tenderness in her extremities. No falls or syncope.  The numbness and tingling in her feet comes and goes. She has puffiness in her feet and ankles that improved with Demadex and elevating her feet.  No episodes of bleeding, no bruising or petechiae. No lymphadenopathy noted on exam.  She has maintained a good appetite and is staying well hydrated. Her weight is stable.   ECOG Performance Status: 1 - Symptomatic but completely ambulatory  Medications:  Allergies as of 09/26/2017      Reactions   Codeine Palpitations      Medication List        Accurate as of 09/26/17  8:49 AM. Always use your most recent med list.          acetaminophen 500 MG tablet Commonly known as:  TYLENOL Take 500 mg by mouth every 6 (six) hours as needed for mild pain or headache.   acyclovir 400 MG tablet Commonly known as:  ZOVIRAX Take 1 tablet (400 mg total) by mouth daily.   B-6 250 MG Tabs TAKE 1 TABLET (250 MG TOTAL) BY  MOUTH DAILY.   benzonatate 100 MG capsule Commonly known as:  TESSALON PERLES Take 2 capsules (200 mg total) 3 (three) times daily as needed by mouth for cough.   diazepam 2 MG tablet Commonly known as:  VALIUM Take 2 tablets (4 mg total) by mouth every 8 (eight) hours as needed for anxiety.   hyoscyamine 0.125 MG SL tablet Commonly known as:  LEVSIN SL PLACE 1 TABLET (0.125 MG TOTAL) UNDER THE TONGUE EVERY 4 (FOUR) HOURS AS NEEDED.   magnesium oxide 400 (241.3 Mg) MG tablet Commonly known as:  MAG-OX TAKE 1 TABLET BY MOUTH TWICE A DAY   multivitamin tablet Take 1 tablet by mouth every evening.   nitrofurantoin (macrocrystal-monohydrate) 100 MG capsule Commonly known as:  MACROBID Take 100 mg by mouth.   ondansetron 8 MG tablet Commonly known as:  ZOFRAN TAKE 1 TABLET BY MOUTH TWICE A DAY FOR NAUSEA AND VOMITING 1 TABLET 1 HOUR PRIOR TO CHEMOTHERAPY   pantoprazole 40 MG tablet Commonly known as:  PROTONIX Take 1 tablet (40 mg total) by mouth daily.   polyethylene glycol powder powder Commonly known as:  GLYCOLAX/MIRALAX 1 capful daily as needed   prochlorperazine 10 MG tablet Commonly known as:  COMPAZINE Take 1 tablet (10 mg total) by mouth every 6 (six) hours as needed for nausea or vomiting.   torsemide 20 MG tablet Commonly known as:  DEMADEX Take 1 tablet (20 mg total) by mouth daily as needed.   traMADol 50 MG tablet Commonly known as:  ULTRAM Take 1 tablet (50 mg total) every 6 (six) hours as needed by mouth.   Vitamin D (Ergocalciferol) 50000 units Caps capsule Commonly known as:  DRISDOL TAKE 1 CAPSULE (50,000 UNITS TOTAL) BY MOUTH ONCE A WEEK.       Allergies:  Allergies  Allergen Reactions  . Codeine Palpitations    Past Medical History, Surgical history, Social history, and Family History were reviewed and updated.  Review of Systems: All other 10 point review of systems is negative.   Physical Exam:  weight is 169 lb (76.7 kg). Her oral  temperature is 97.9 F (36.6 C). Her blood pressure is 125/63 and her pulse is 66. Her respiration is 16 and oxygen saturation is 100%.   Wt Readings from Last 3 Encounters:  09/26/17 169 lb (76.7 kg)  09/12/17 169 lb (76.7 kg)  08/29/17 165 lb (74.8 kg)    Ocular: Sclerae unicteric, pupils equal, round and reactive to light Ear-nose-throat: Oropharynx clear, dentition fair Lymphatic: No cervical, supraclavicular or axillary adenopathy Lungs no rales or rhonchi, good excursion bilaterally Heart regular rate and rhythm, no murmur appreciated Abd soft, nontender, positive bowel sounds, no liver or spleen tip palpated on exam, no fluid wave MSK no focal spinal tenderness, no joint edema Neuro: non-focal, well-oriented, appropriate affect Breasts: Deferred   Lab Results  Component Value Date   WBC 1.9 (L) 09/26/2017   HGB 9.8 (L) 09/26/2017   HCT 29.2 (L) 09/26/2017   MCV 92.1 09/26/2017   PLT 140 (L) 09/26/2017   Lab Results  Component Value Date   FERRITIN 1,083 (H) 08/29/2017   IRON 72 08/29/2017   TIBC 251 08/29/2017   UIBC 179 08/29/2017   IRONPCTSAT 29 08/29/2017   Lab Results  Component Value Date   RETICCTPCT 1.1 07/18/2017   RBC 3.17 (L) 09/26/2017   RETICCTABS 35.9 12/25/2014   Lab Results  Component Value Date   KPAFRELGTCHN 12.7 09/12/2017   LAMBDASER 5.9 09/12/2017   KAPLAMBRATIO 2.15 (H) 09/12/2017   Lab Results  Component Value Date   IGGSERUM 1,316 09/12/2017   IGGSERUM 1,275 09/12/2017   IGA 23 (L) 09/12/2017   IGA 23 (L) 09/12/2017   IGMSERUM 11 (L) 09/12/2017   IGMSERUM 11 (L) 09/12/2017   Lab Results  Component Value Date   TOTALPROTELP 6.4 09/12/2017   ALBUMINELP 3.7 09/12/2017   A1GS 0.2 09/12/2017   A2GS 0.6 09/12/2017   BETS 0.8 09/12/2017   BETA2SER 0.3 05/07/2015   GAMS 1.1 09/12/2017   MSPIKE 0.7 (H) 09/12/2017   SPEI * 05/07/2015     Chemistry      Component Value Date/Time   NA 144 09/12/2017 0950   NA 143 05/09/2017  0812   NA 139 02/20/2016 1207   K 4.4 09/12/2017 0950   K 4.0 05/09/2017 0812   K 3.8 02/20/2016 1207   CL 110 (H) 09/12/2017 0950   CL 108 05/09/2017 0812   CO2 26 09/12/2017 0950   CO2 26 05/09/2017 0812   CO2 29 02/20/2016 1207   BUN 14 09/12/2017 0950   BUN 13 05/09/2017 0812   BUN 22.3 02/20/2016 1207   CREATININE 1.20 09/12/2017 0950   CREATININE 0.9 05/09/2017 0812   CREATININE 1.3 (H) 02/20/2016 1207      Component Value Date/Time   CALCIUM 9.3 09/12/2017 0950   CALCIUM 8.3 05/09/2017 5400  CALCIUM 9.7 02/20/2016 1207   ALKPHOS 60 09/12/2017 0950   ALKPHOS 123 (H) 05/09/2017 0812   ALKPHOS 62 02/20/2016 1207   AST 27 09/12/2017 0950   AST 27 02/20/2016 1207   ALT 32 09/12/2017 0950   ALT 26 05/09/2017 0812   ALT 19 02/20/2016 1207   BILITOT 0.6 09/12/2017 0950   BILITOT 0.44 02/20/2016 1207      Impression and Plan: Ms. Char is a very pleasant 62 yo African American female with IgG kappa myeloma. She is doing well but still having intermittent back pain. We will check on her referral to Dr. Vertell Limber.  Her WBC count is slightly better and platelet count is up to 140. Hgb stable at 9.8.  We will hold treatment today and plan to see her back in 1 week for follow-up and hoperfully resume treatment at that time.  She will contact our office with any questions or concerns. We can certainly see her sooner if need be.   Laverna Peace, NP 5/20/20198:49 AM

## 2017-09-26 NOTE — Patient Instructions (Signed)
Implanted Port Home Guide An implanted port is a type of central line that is placed under the skin. Central lines are used to provide IV access when treatment or nutrition needs to be given through a person's veins. Implanted ports are used for long-term IV access. An implanted port may be placed because:  You need IV medicine that would be irritating to the small veins in your hands or arms.  You need long-term IV medicines, such as antibiotics.  You need IV nutrition for a long period.  You need frequent blood draws for lab tests.  You need dialysis.  Implanted ports are usually placed in the chest area, but they can also be placed in the upper arm, the abdomen, or the leg. An implanted port has two main parts:  Reservoir. The reservoir is round and will appear as a small, raised area under your skin. The reservoir is the part where a needle is inserted to give medicines or draw blood.  Catheter. The catheter is a thin, flexible tube that extends from the reservoir. The catheter is placed into a large vein. Medicine that is inserted into the reservoir goes into the catheter and then into the vein.  How will I care for my incision site? Do not get the incision site wet. Bathe or shower as directed by your health care provider. How is my port accessed? Special steps must be taken to access the port:  Before the port is accessed, a numbing cream can be placed on the skin. This helps numb the skin over the port site.  Your health care provider uses a sterile technique to access the port. ? Your health care provider must put on a mask and sterile gloves. ? The skin over your port is cleaned carefully with an antiseptic and allowed to dry. ? The port is gently pinched between sterile gloves, and a needle is inserted into the port.  Only "non-coring" port needles should be used to access the port. Once the port is accessed, a blood return should be checked. This helps ensure that the port  is in the vein and is not clogged.  If your port needs to remain accessed for a constant infusion, a clear (transparent) bandage will be placed over the needle site. The bandage and needle will need to be changed every week, or as directed by your health care provider.  Keep the bandage covering the needle clean and dry. Do not get it wet. Follow your health care provider's instructions on how to take a shower or bath while the port is accessed.  If your port does not need to stay accessed, no bandage is needed over the port.  What is flushing? Flushing helps keep the port from getting clogged. Follow your health care provider's instructions on how and when to flush the port. Ports are usually flushed with saline solution or a medicine called heparin. The need for flushing will depend on how the port is used.  If the port is used for intermittent medicines or blood draws, the port will need to be flushed: ? After medicines have been given. ? After blood has been drawn. ? As part of routine maintenance.  If a constant infusion is running, the port may not need to be flushed.  How long will my port stay implanted? The port can stay in for as long as your health care provider thinks it is needed. When it is time for the port to come out, surgery will be   done to remove it. The procedure is similar to the one performed when the port was put in. When should I seek immediate medical care? When you have an implanted port, you should seek immediate medical care if:  You notice a bad smell coming from the incision site.  You have swelling, redness, or drainage at the incision site.  You have more swelling or pain at the port site or the surrounding area.  You have a fever that is not controlled with medicine.  This information is not intended to replace advice given to you by your health care provider. Make sure you discuss any questions you have with your health care provider. Document  Released: 04/26/2005 Document Revised: 10/02/2015 Document Reviewed: 01/01/2013 Elsevier Interactive Patient Education  2017 [employer]  

## 2017-09-27 ENCOUNTER — Inpatient Hospital Stay: Payer: 59

## 2017-09-27 LAB — IGG, IGA, IGM
IGM (IMMUNOGLOBULIN M), SRM: 9 mg/dL — AB (ref 26–217)
IgA: 21 mg/dL — ABNORMAL LOW (ref 87–352)
IgG (Immunoglobin G), Serum: 1201 mg/dL (ref 700–1600)

## 2017-09-27 LAB — KAPPA/LAMBDA LIGHT CHAINS
Kappa free light chain: 12.5 mg/L (ref 3.3–19.4)
Kappa, lambda light chain ratio: 1.92 — ABNORMAL HIGH (ref 0.26–1.65)
Lambda free light chains: 6.5 mg/L (ref 5.7–26.3)

## 2017-09-29 ENCOUNTER — Other Ambulatory Visit: Payer: Self-pay | Admitting: Family

## 2017-09-29 DIAGNOSIS — C9000 Multiple myeloma not having achieved remission: Secondary | ICD-10-CM

## 2017-09-29 DIAGNOSIS — G8929 Other chronic pain: Secondary | ICD-10-CM

## 2017-09-29 DIAGNOSIS — M545 Low back pain, unspecified: Secondary | ICD-10-CM

## 2017-09-29 DIAGNOSIS — M899 Disorder of bone, unspecified: Secondary | ICD-10-CM

## 2017-09-30 LAB — PROTEIN ELECTROPHORESIS, SERUM, WITH REFLEX
A/G Ratio: 1.4 (ref 0.7–1.7)
ALBUMIN ELP: 3.7 g/dL (ref 2.9–4.4)
ALPHA-1-GLOBULIN: 0.2 g/dL (ref 0.0–0.4)
ALPHA-2-GLOBULIN: 0.5 g/dL (ref 0.4–1.0)
Beta Globulin: 0.9 g/dL (ref 0.7–1.3)
GAMMA GLOBULIN: 1 g/dL (ref 0.4–1.8)
Globulin, Total: 2.6 g/dL (ref 2.2–3.9)
M-Spike, %: 0.7 g/dL — ABNORMAL HIGH
SPEP Interpretation: 0
TOTAL PROTEIN ELP: 6.3 g/dL (ref 6.0–8.5)

## 2017-09-30 LAB — IMMUNOFIXATION REFLEX, SERUM
IGA: 21 mg/dL — AB (ref 87–352)
IgG (Immunoglobin G), Serum: 1202 mg/dL (ref 700–1600)
IgM (Immunoglobulin M), Srm: 8 mg/dL — ABNORMAL LOW (ref 26–217)

## 2017-10-04 ENCOUNTER — Encounter: Payer: Self-pay | Admitting: Hematology & Oncology

## 2017-10-05 ENCOUNTER — Ambulatory Visit: Payer: 59 | Admitting: Hematology & Oncology

## 2017-10-05 ENCOUNTER — Other Ambulatory Visit: Payer: 59

## 2017-10-06 DIAGNOSIS — H4321 Crystalline deposits in vitreous body, right eye: Secondary | ICD-10-CM | POA: Diagnosis not present

## 2017-10-06 DIAGNOSIS — H524 Presbyopia: Secondary | ICD-10-CM | POA: Diagnosis not present

## 2017-10-07 ENCOUNTER — Other Ambulatory Visit: Payer: Self-pay | Admitting: *Deleted

## 2017-10-07 DIAGNOSIS — C9000 Multiple myeloma not having achieved remission: Secondary | ICD-10-CM

## 2017-10-10 ENCOUNTER — Inpatient Hospital Stay: Payer: 59

## 2017-10-10 ENCOUNTER — Inpatient Hospital Stay: Payer: 59 | Attending: Hematology & Oncology | Admitting: Hematology & Oncology

## 2017-10-10 ENCOUNTER — Other Ambulatory Visit: Payer: Self-pay

## 2017-10-10 ENCOUNTER — Encounter: Payer: Self-pay | Admitting: Hematology & Oncology

## 2017-10-10 VITALS — BP 118/63 | HR 65 | Temp 97.7°F | Resp 18 | Wt 172.0 lb

## 2017-10-10 DIAGNOSIS — C9000 Multiple myeloma not having achieved remission: Secondary | ICD-10-CM

## 2017-10-10 DIAGNOSIS — M545 Low back pain: Secondary | ICD-10-CM

## 2017-10-10 DIAGNOSIS — C9002 Multiple myeloma in relapse: Secondary | ICD-10-CM

## 2017-10-10 DIAGNOSIS — Z5112 Encounter for antineoplastic immunotherapy: Secondary | ICD-10-CM | POA: Insufficient documentation

## 2017-10-10 DIAGNOSIS — Z5111 Encounter for antineoplastic chemotherapy: Secondary | ICD-10-CM | POA: Insufficient documentation

## 2017-10-10 DIAGNOSIS — Z79899 Other long term (current) drug therapy: Secondary | ICD-10-CM | POA: Diagnosis not present

## 2017-10-10 LAB — CMP (CANCER CENTER ONLY)
ALBUMIN: 3.6 g/dL (ref 3.5–5.0)
ALK PHOS: 50 U/L (ref 26–84)
ALT: 25 U/L (ref 10–47)
ANION GAP: 8 (ref 5–15)
AST: 29 U/L (ref 11–38)
BILIRUBIN TOTAL: 0.7 mg/dL (ref 0.2–1.6)
BUN: 20 mg/dL (ref 7–22)
CHLORIDE: 111 mmol/L — AB (ref 98–108)
CO2: 26 mmol/L (ref 18–33)
CREATININE: 0.9 mg/dL (ref 0.60–1.20)
Calcium: 8.8 mg/dL (ref 8.0–10.3)
Glucose, Bld: 88 mg/dL (ref 73–118)
POTASSIUM: 4 mmol/L (ref 3.3–4.7)
Sodium: 145 mmol/L (ref 128–145)
Total Protein: 6.6 g/dL (ref 6.4–8.1)

## 2017-10-10 LAB — CBC WITH DIFFERENTIAL (CANCER CENTER ONLY)
Basophils Absolute: 0 10*3/uL (ref 0.0–0.1)
Basophils Relative: 1 %
EOS ABS: 0.1 10*3/uL (ref 0.0–0.5)
Eosinophils Relative: 4 %
HEMATOCRIT: 29.6 % — AB (ref 34.8–46.6)
HEMOGLOBIN: 9.8 g/dL — AB (ref 11.6–15.9)
LYMPHS ABS: 0.6 10*3/uL — AB (ref 0.9–3.3)
Lymphocytes Relative: 29 %
MCH: 30.4 pg (ref 26.0–34.0)
MCHC: 33.1 g/dL (ref 32.0–36.0)
MCV: 91.9 fL (ref 81.0–101.0)
Monocytes Absolute: 0.4 10*3/uL (ref 0.1–0.9)
Monocytes Relative: 19 %
NEUTROS ABS: 1 10*3/uL — AB (ref 1.5–6.5)
NEUTROS PCT: 47 %
Platelet Count: 124 10*3/uL — ABNORMAL LOW (ref 145–400)
RBC: 3.22 MIL/uL — AB (ref 3.70–5.32)
RDW: 14.3 % (ref 11.1–15.7)
WBC: 2 10*3/uL — AB (ref 3.9–10.0)

## 2017-10-10 MED ORDER — SODIUM CHLORIDE 0.9 % IJ SOLN
10.0000 mL | Freq: Once | INTRAMUSCULAR | Status: AC
  Administered 2017-10-10: 10 mL
  Filled 2017-10-10: qty 10

## 2017-10-10 MED ORDER — SODIUM CHLORIDE 0.9 % IV SOLN
180.0000 mg/m2 | Freq: Once | INTRAVENOUS | Status: AC
  Administered 2017-10-10: 340 mg via INTRAVENOUS
  Filled 2017-10-10: qty 17

## 2017-10-10 MED ORDER — PALONOSETRON HCL INJECTION 0.25 MG/5ML
0.2500 mg | Freq: Once | INTRAVENOUS | Status: AC
  Administered 2017-10-10: 0.25 mg via INTRAVENOUS

## 2017-10-10 MED ORDER — DEXTROSE 5 % IV SOLN
37.0000 mg/m2 | Freq: Once | INTRAVENOUS | Status: AC
  Administered 2017-10-10: 70 mg via INTRAVENOUS
  Filled 2017-10-10: qty 35

## 2017-10-10 MED ORDER — DEXAMETHASONE SODIUM PHOSPHATE 10 MG/ML IJ SOLN
INTRAMUSCULAR | Status: AC
Start: 2017-10-10 — End: ?
  Filled 2017-10-10: qty 1

## 2017-10-10 MED ORDER — SODIUM CHLORIDE 0.9 % IV SOLN
Freq: Once | INTRAVENOUS | Status: AC
  Administered 2017-10-10: 09:00:00 via INTRAVENOUS

## 2017-10-10 MED ORDER — HEPARIN SOD (PORK) LOCK FLUSH 100 UNIT/ML IV SOLN
500.0000 [IU] | Freq: Once | INTRAVENOUS | Status: AC | PRN
  Administered 2017-10-10: 500 [IU]
  Filled 2017-10-10: qty 5

## 2017-10-10 MED ORDER — DEXAMETHASONE SODIUM PHOSPHATE 10 MG/ML IJ SOLN
10.0000 mg | Freq: Once | INTRAMUSCULAR | Status: AC
  Administered 2017-10-10: 10 mg via INTRAVENOUS

## 2017-10-10 MED ORDER — PALONOSETRON HCL INJECTION 0.25 MG/5ML
INTRAVENOUS | Status: AC
Start: 2017-10-10 — End: ?
  Filled 2017-10-10: qty 5

## 2017-10-10 MED ORDER — SODIUM CHLORIDE 0.9% FLUSH
10.0000 mL | INTRAVENOUS | Status: DC | PRN
  Administered 2017-10-10: 10 mL
  Filled 2017-10-10: qty 10

## 2017-10-10 NOTE — Addendum Note (Signed)
Addended by: Burney Gauze R on: 10/10/2017 09:21 AM   Modules accepted: Orders

## 2017-10-10 NOTE — Progress Notes (Signed)
Ok to treat with ANC 0.93 per Dr. Marin Olp.

## 2017-10-10 NOTE — Progress Notes (Signed)
Hematology and Oncology Follow Up Visit  Barbara Cook [id number] [birth year] 62 y.o. 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  Everything is going quite well for her.  She feels well.  Her last treatment was probably 2 weeks ago.  She really enjoys having off a couple weeks between treatments.  Her monoclonal levels have been holding steady.  We last saw her, her M spike was 0.7 g/dL.  Her IgG level was 1200 mg/dL.  Her kappa light chain was 1.3 mg/dL.  She is had no fever.  She has had no cough.  She still is bothered by some back discomfort.  She will see her neurosurgeon in a week or so.  She has had no rashes.  Is been no leg swelling.  She has had no problems with bowels or bladder.  She has had no diarrhea.  Overall, her performance status is ECOG 1.    Medications:  Allergies as of 10/10/2017      Reactions   Codeine Palpitations      Medication List        Accurate as of 10/10/17  8:16 AM. Always use your most recent med list.          acetaminophen 500 MG tablet Commonly known as:  TYLENOL Take 500 mg by mouth every 6 (six) hours as needed for mild pain or headache.   acyclovir 400 MG tablet Commonly known as:  ZOVIRAX Take 1 tablet (400 mg total) by mouth daily.   B-6 250 MG Tabs TAKE 1 TABLET (250 MG TOTAL) BY MOUTH DAILY.   benzonatate 100 MG capsule Commonly known as:  TESSALON PERLES Take 2 capsules (200 mg total) 3 (three) times daily as needed by mouth for cough.     diazepam 2 MG tablet Commonly known as:  VALIUM Take 2 tablets (4 mg total) by mouth every 8 (eight) hours as needed for anxiety.   hyoscyamine 0.125 MG SL tablet Commonly known as:  LEVSIN SL PLACE 1 TABLET (0.125 MG TOTAL) UNDER THE TONGUE EVERY 4 (FOUR) HOURS AS NEEDED.   magnesium oxide 400 (241.3 Mg) MG tablet Commonly known as:  MAG-OX TAKE 1 TABLET BY MOUTH TWICE A DAY   multivitamin tablet Take 1 tablet by mouth every evening.   nitrofurantoin (macrocrystal-monohydrate) 100 MG capsule Commonly known as:  MACROBID Take 100 mg by mouth.   ondansetron 8 MG tablet Commonly known as:  ZOFRAN TAKE 1 TABLET BY MOUTH TWICE A DAY FOR NAUSEA AND VOMITING 1 TABLET 1 HOUR PRIOR TO CHEMOTHERAPY   pantoprazole 40 MG tablet Commonly known as:  PROTONIX Take 1 tablet (40 mg total) by mouth daily.   polyethylene glycol powder powder Commonly known as:  GLYCOLAX/MIRALAX 1 capful daily as needed   prochlorperazine 10 MG tablet Commonly known as:  COMPAZINE Take 1 tablet (10 mg total) by mouth every 6 (six) hours as needed for nausea or vomiting.   torsemide 20 MG tablet Commonly known as:  DEMADEX Take 1 tablet (20  mg total) by mouth daily as needed.   traMADol 50 MG tablet Commonly known as:  ULTRAM Take 1 tablet (50 mg total) every 6 (six) hours as needed by mouth.   Vitamin D (Ergocalciferol) 50000 units Caps capsule Commonly known as:  DRISDOL TAKE 1 CAPSULE (50,000 UNITS TOTAL) BY MOUTH ONCE A WEEK.       Allergies:  Allergies  Allergen Reactions  . Codeine Palpitations    Past Medical History, Surgical history, Social history, and Family History were reviewed and updated.  Review of Systems: Review of Systems  Constitutional: Negative.   HENT: Negative.   Eyes: Negative.   Respiratory: Negative.   Cardiovascular: Negative.   Gastrointestinal: Negative.  Abdominal pain: .phys.  Genitourinary: Negative.   Musculoskeletal: Negative.   Skin: Negative.    Neurological: Negative.   Endo/Heme/Allergies: Negative.   Psychiatric/Behavioral: Negative.      Physical Exam:  weight is 172 lb (78 kg). Her oral temperature is 97.7 F (36.5 C). Her blood pressure is 118/63 and her pulse is 65. Her respiration is 18 and oxygen saturation is 100%.   Wt Readings from Last 3 Encounters:  10/10/17 172 lb (78 kg)  09/26/17 169 lb (76.7 kg)  09/12/17 169 lb (76.7 kg)    Physical Exam  Constitutional: She is oriented to person, place, and time.  HENT:  Head: Normocephalic and atraumatic.  Mouth/Throat: Oropharynx is clear and moist.  Eyes: Pupils are equal, round, and reactive to light. EOM are normal.  Neck: Normal range of motion.  Cardiovascular: Normal rate, regular rhythm and normal heart sounds.  Pulmonary/Chest: Effort normal and breath sounds normal.  Abdominal: Soft. Bowel sounds are normal.  Musculoskeletal: Normal range of motion. She exhibits no edema, tenderness or deformity.  Lymphadenopathy:    She has no cervical adenopathy.  Neurological: She is alert and oriented to person, place, and time.  Skin: Skin is warm and dry. No rash noted. No erythema.  Psychiatric: She has a normal mood and affect. Her behavior is normal. Judgment and thought content normal.  Vitals reviewed.    Lab Results  Component Value Date   WBC 1.9 (L) 09/26/2017   HGB 9.8 (L) 09/26/2017   HCT 29.2 (L) 09/26/2017   MCV 92.1 09/26/2017   PLT 140 (L) 09/26/2017   Lab Results  Component Value Date   FERRITIN 1,083 (H) 08/29/2017   IRON 72 08/29/2017   TIBC 251 08/29/2017   UIBC 179 08/29/2017   IRONPCTSAT 29 08/29/2017   Lab Results  Component Value Date   RETICCTPCT 1.1 07/18/2017   RBC 3.17 (L) 09/26/2017   RETICCTABS 35.9 12/25/2014   Lab Results  Component Value Date   KPAFRELGTCHN 12.5 09/26/2017   LAMBDASER 6.5 09/26/2017   KAPLAMBRATIO 1.92 (H) 09/26/2017   Lab Results  Component Value Date   IGGSERUM 1,201 09/26/2017    IGGSERUM 1,202 09/26/2017   IGA 21 (L) 09/26/2017   IGA 21 (L) 09/26/2017   IGMSERUM 9 (L) 09/26/2017   IGMSERUM 8 (L) 09/26/2017   Lab Results  Component Value Date   TOTALPROTELP 6.3 09/26/2017   ALBUMINELP 3.7 09/26/2017   A1GS 0.2 09/26/2017   A2GS 0.5 09/26/2017   BETS 0.9 09/26/2017   BETA2SER 0.3 05/07/2015   GAMS 1.0 09/26/2017   MSPIKE 0.7 (H) 09/26/2017   SPEI * 05/07/2015     Chemistry      Component Value Date/Time   NA 143 09/26/2017 0820   NA 143 05/09/2017 4098  NA 139 02/20/2016 1207   K 4.0 09/26/2017 0820   K 4.0 05/09/2017 0812   K 3.8 02/20/2016 1207   CL 109 (H) 09/26/2017 0820   CL 108 05/09/2017 0812   CO2 27 09/26/2017 0820   CO2 26 05/09/2017 0812   CO2 29 02/20/2016 1207   BUN 20 09/26/2017 0820   BUN 13 05/09/2017 0812   BUN 22.3 02/20/2016 1207   CREATININE 1.00 09/26/2017 0820   CREATININE 0.9 05/09/2017 0812   CREATININE 1.3 (H) 02/20/2016 1207      Component Value Date/Time   CALCIUM 8.4 09/26/2017 0820   CALCIUM 8.3 05/09/2017 0812   CALCIUM 9.7 02/20/2016 1207   ALKPHOS 59 09/26/2017 0820   ALKPHOS 123 (H) 05/09/2017 0812   ALKPHOS 62 02/20/2016 1207   AST 26 09/26/2017 0820   AST 27 02/20/2016 1207   ALT 27 09/26/2017 0820   ALT 26 05/09/2017 0812   ALT 19 02/20/2016 1207   BILITOT 0.7 09/26/2017 0820   BILITOT 0.44 02/20/2016 1207      Impression and Plan: Ms. Brocksmith is a very pleasant 62 yo African American female with IgG kappa myeloma.  Everything is holding quite stable.  We will go ahead and keep her treatments at 2-week intervals.  If things look stable for the next month, then we might go 3-week intervals.  I am just happy that her quality of life is doing better.  [name], MD 6/3/20198:16 AM

## 2017-10-10 NOTE — Patient Instructions (Signed)
Implanted Port Home Guide An implanted port is a type of central line that is placed under the skin. Central lines are used to provide IV access when treatment or nutrition needs to be given through a person's veins. Implanted ports are used for long-term IV access. An implanted port may be placed because:  You need IV medicine that would be irritating to the small veins in your hands or arms.  You need long-term IV medicines, such as antibiotics.  You need IV nutrition for a long period.  You need frequent blood draws for lab tests.  You need dialysis.  Implanted ports are usually placed in the chest area, but they can also be placed in the upper arm, the abdomen, or the leg. An implanted port has two main parts:  Reservoir. The reservoir is round and will appear as a small, raised area under your skin. The reservoir is the part where a needle is inserted to give medicines or draw blood.  Catheter. The catheter is a thin, flexible tube that extends from the reservoir. The catheter is placed into a large vein. Medicine that is inserted into the reservoir goes into the catheter and then into the vein.  How will I care for my incision site? Do not get the incision site wet. Bathe or shower as directed by your health care provider. How is my port accessed? Special steps must be taken to access the port:  Before the port is accessed, a numbing cream can be placed on the skin. This helps numb the skin over the port site.  Your health care provider uses a sterile technique to access the port. ? Your health care provider must put on a mask and sterile gloves. ? The skin over your port is cleaned carefully with an antiseptic and allowed to dry. ? The port is gently pinched between sterile gloves, and a needle is inserted into the port.  Only "non-coring" port needles should be used to access the port. Once the port is accessed, a blood return should be checked. This helps ensure that the port  is in the vein and is not clogged.  If your port needs to remain accessed for a constant infusion, a clear (transparent) bandage will be placed over the needle site. The bandage and needle will need to be changed every week, or as directed by your health care provider.  Keep the bandage covering the needle clean and dry. Do not get it wet. Follow your health care provider's instructions on how to take a shower or bath while the port is accessed.  If your port does not need to stay accessed, no bandage is needed over the port.  What is flushing? Flushing helps keep the port from getting clogged. Follow your health care provider's instructions on how and when to flush the port. Ports are usually flushed with saline solution or a medicine called heparin. The need for flushing will depend on how the port is used.  If the port is used for intermittent medicines or blood draws, the port will need to be flushed: ? After medicines have been given. ? After blood has been drawn. ? As part of routine maintenance.  If a constant infusion is running, the port may not need to be flushed.  How long will my port stay implanted? The port can stay in for as long as your health care provider thinks it is needed. When it is time for the port to come out, surgery will be   done to remove it. The procedure is similar to the one performed when the port was put in. When should I seek immediate medical care? When you have an implanted port, you should seek immediate medical care if:  You notice a bad smell coming from the incision site.  You have swelling, redness, or drainage at the incision site.  You have more swelling or pain at the port site or the surrounding area.  You have a fever that is not controlled with medicine.  This information is not intended to replace advice given to you by your health care provider. Make sure you discuss any questions you have with your health care provider. Document  Released: 04/26/2005 Document Revised: 10/02/2015 Document Reviewed: 01/01/2013 Elsevier Interactive Patient Education  2017 [employer]  

## 2017-10-10 NOTE — Patient Instructions (Signed)
[facility] Discharge Instructions for Patients Receiving Chemotherapy  Today you received the following chemotherapy agents:  Kyprolis & Cytoxan  To help prevent nausea and vomiting after your treatment, we encourage you to take your nausea medication as prescribed.    If you develop nausea and vomiting that is not controlled by your nausea medication, call the clinic.   BELOW ARE SYMPTOMS THAT SHOULD BE REPORTED IMMEDIATELY:  *FEVER GREATER THAN 100.5 F  *CHILLS WITH OR WITHOUT FEVER  NAUSEA AND VOMITING THAT IS NOT CONTROLLED WITH YOUR NAUSEA MEDICATION  *UNUSUAL SHORTNESS OF BREATH  *UNUSUAL BRUISING OR BLEEDING  TENDERNESS IN MOUTH AND THROAT WITH OR WITHOUT PRESENCE OF ULCERS  *URINARY PROBLEMS  *BOWEL PROBLEMS  UNUSUAL RASH Items with * indicate a potential emergency and should be followed up as soon as possible.  Feel free to call the clinic should you have any questions or concerns. The clinic phone number is [phone].  Please show the CHEMO ALERT CARD at check-in to the Emergency Department and triage nurse.   

## 2017-10-11 ENCOUNTER — Telehealth: Payer: Self-pay | Admitting: *Deleted

## 2017-10-11 ENCOUNTER — Inpatient Hospital Stay: Payer: 59

## 2017-10-11 NOTE — Telephone Encounter (Signed)
Patient states "I had something come up" and will not be in the office today. She asked about rescheduling her Day 2 of Kyprolis.  Spoke with Dr Marin Olp. He states the patient does not need to be rescheduled. We will just document as a missed dose. Patient aware and appreciative.

## 2017-10-12 ENCOUNTER — Encounter: Payer: Self-pay | Admitting: Gastroenterology

## 2017-10-12 ENCOUNTER — Ambulatory Visit: Payer: 59 | Admitting: Gastroenterology

## 2017-10-12 VITALS — BP 122/74 | HR 68 | Ht 62.0 in | Wt 174.5 lb

## 2017-10-12 DIAGNOSIS — R1011 Right upper quadrant pain: Secondary | ICD-10-CM

## 2017-10-12 DIAGNOSIS — R1013 Epigastric pain: Secondary | ICD-10-CM | POA: Diagnosis not present

## 2017-10-12 DIAGNOSIS — R140 Abdominal distension (gaseous): Secondary | ICD-10-CM

## 2017-10-12 NOTE — Patient Instructions (Signed)
You have been scheduled for an abdominal ultrasound at [facility] Radiology (1st floor of hospital) on 10-17-17 at 9:00 am. Please arrive 15 minutes prior to your appointment for registration. Make certain not to have anything to eat or drink 6 hours prior to your appointment. Should you need to reschedule your appointment, please contact radiology at [phone]. This test typically takes about 30 minutes to perform.

## 2017-10-12 NOTE — Progress Notes (Addendum)
10/12/2017 RYE DORADO [id number] 1956/01/24   HISTORY OF PRESENT ILLNESS:  This is a pleasant 62 year old female who is known to Dr. Hilarie Fredrickson.  She has history of GERD, IBS, and multiple myeloma for which she is still on treatment.  Has long-standing complaints of upper abdominal discomfort and bloating.  Says that symptoms come and go, sometimes occur after eating but sometimes occur even when she has not eaten in a while.  Has some symptoms about every day.  Symptoms date back at least 2 years.  Symptoms no worse than they have been in the past.  Denies nausea, vomiting, constipation, dark or bloody stools.  Appetite is good, no early satiety.  No weight loss, in fact, weight is up a couple of pounds compared to when she was seen here last one year ago.  EGD, colonoscopy, and CT scan all within the past 1.5-2 years for the same symptoms were unremarkable.  Has tried several regimens for symptomatic management without much improvement.  Is on her pantoprazole 40 mg daily.  Has tried Phazyme, FDgard, Electronics engineer prebiotic and probiotic, etc.  EGD in August 2017 showed some moderate inflammation with erosions and erythema in the stomach. Biopsies were unremarkable. Her colonoscopy at that same time was normal. CT scan of the abdomen and pelvis with contrast in 02/2016 was unremarkable except for possible constipation due to colonic stool burden.   She still using her Miralax for constipation, which seems to be working well.    Past Medical History:  Diagnosis Date  . Anemia   . Arthritis   . Arthropathy, unspecified, site unspecified   . Complication of anesthesia    02/13/14- had biospy in X-Ray- "twilight"  "I felt every thing"  . Constipation   . Endometriosis   . Family history of adverse reaction to anesthesia    Sister- patient will find out  . Fibroid   . GERD (gastroesophageal reflux disease)   . Heart murmur   . IBS (irritable bowel syndrome)   . Iron deficiency anemia due to  chronic blood loss 08/15/2017  . Malabsorption of iron 08/15/2017  . MGUS (monoclonal gammopathy of unknown significance) 01/26/202013  . Multiple myeloma ([place]) 02/18/2014  . Myelitis ([place]) 2011   of bone- neck  . Neuromuscular disorder ([place])   . Polymyalgia rheumatica ([place])   . PONV (postoperative nausea and vomiting)    1990   Past Surgical History:  Procedure Laterality Date  . COLONOSCOPY    . Fistula repair surg    . IR GENERIC HISTORICAL  04/14/2016   IR FLUORO GUIDE PORT INSERTION RIGHT 04/14/2016 Greggory Keen, MD [facility]-INTERV RAD  . IR GENERIC HISTORICAL  04/14/2016   IR US GUIDE VASC ACCESS RIGHT 04/14/2016 Greggory Keen, MD [facility]-INTERV RAD  . KNEE ARTHROSCOPY Left   . NECK SURGERY  2011   Myletis- bone grafting- from her left hip  . RADIOLOGY WITH ANESTHESIA N/A 08/23/2014   Procedure: T12  ABLATION       (RADIOLOGY WITH ANESTHESIA);  Surgeon: Luanne Bras, MD;  Location: [place];  Service: Radiology;  Laterality: N/A;  . TUBAL LIGATION    . UMBILICAL HERNIA REPAIR     age 51    reports that she quit smoking about 23 years ago. Her smoking use included cigarettes. She started smoking about 29 years ago. She has a 125.00 pack-year smoking history. She has never used smokeless tobacco. She reports that she does not drink alcohol or use drugs.  family history includes Diabetes in her maternal grandmother; Heart disease in her maternal grandmother, mother, and paternal grandmother; Hypertension in her maternal grandmother; Lung cancer in her maternal uncle; Rheum arthritis in her mother; Throat cancer in her maternal aunt. Allergies  Allergen Reactions  . Codeine Palpitations      Outpatient Encounter Medications as of 10/12/2017  Medication Sig  . acetaminophen (TYLENOL) 500 MG tablet Take 500 mg by mouth every 6 (six) hours as needed for mild pain or headache.  Marland Kitchen acyclovir (ZOVIRAX) 400 MG tablet Take 1 tablet (400 mg total) by mouth daily.  . hyoscyamine (LEVSIN SL) 0.125 MG SL tablet  PLACE 1 TABLET (0.125 MG TOTAL) UNDER THE TONGUE EVERY 4 (FOUR) HOURS AS NEEDED.  . magnesium oxide (MAG-OX) 400 (241.3 Mg) MG tablet TAKE 1 TABLET BY MOUTH TWICE A DAY  . Multiple Vitamin (MULTIVITAMIN) tablet Take 1 tablet by mouth every evening.   . ondansetron (ZOFRAN) 8 MG tablet TAKE 1 TABLET BY MOUTH TWICE A DAY FOR NAUSEA AND VOMITING 1 TABLET 1 HOUR PRIOR TO CHEMOTHERAPY  . pantoprazole (PROTONIX) 40 MG tablet Take 1 tablet (40 mg total) by mouth daily.  . polyethylene glycol powder (GLYCOLAX/MIRALAX) powder 1 capful daily as needed  . prochlorperazine (COMPAZINE) 10 MG tablet Take 1 tablet (10 mg total) by mouth every 6 (six) hours as needed for nausea or vomiting.  . Pyridoxine HCl (B-6) 250 MG TABS TAKE 1 TABLET (250 MG TOTAL) BY MOUTH DAILY.  Marland Kitchen torsemide (DEMADEX) 20 MG tablet Take 1 tablet (20 mg total) by mouth daily as needed.  . traMADol (ULTRAM) 50 MG tablet Take 1 tablet (50 mg total) every 6 (six) hours as needed by mouth.  . Vitamin D, Ergocalciferol, (DRISDOL) 50000 units CAPS capsule TAKE 1 CAPSULE (50,000 UNITS TOTAL) BY MOUTH ONCE A WEEK.  . [DISCONTINUED] benzonatate (TESSALON PERLES) 100 MG capsule Take 2 capsules (200 mg total) 3 (three) times daily as needed by mouth for cough.  . [DISCONTINUED] diazepam (VALIUM) 2 MG tablet Take 2 tablets (4 mg total) by mouth every 8 (eight) hours as needed for anxiety.  . [DISCONTINUED] nitrofurantoin, macrocrystal-monohydrate, (MACROBID) 100 MG capsule Take 100 mg by mouth.   Facility-Administered Encounter Medications as of 10/12/2017  Medication  . 0.9 %  sodium chloride infusion  . sodium chloride flush (NS) 0.9 % injection 10 mL  . sodium chloride flush (NS) 0.9 % injection 10 mL     REVIEW OF SYSTEMS  : All other systems reviewed and negative except where noted in the History of Present Illness.   PHYSICAL EXAM: BP 122/74   Pulse 68   Ht 5' 2"  (1.575 m)   Wt 174 lb 8 oz (79.2 kg)   BMI 31.92 kg/m  General: Well  developed black female in no acute distress Head: Normocephalic and atraumatic Eyes:  Sclerae anicteric, conjunctiva pink. Ears: Normal auditory acuity Lungs: Clear throughout to auscultation; no increased WOB. Heart: Regular rate and rhythm; no M/R/G. Abdomen: Soft, non-distended.  BS present.  Non-tender. Musculoskeletal: Symmetrical with no gross deformities  Skin: No lesions on visible extremities Extremities: No edema  Neurological: Alert oriented x 4, grossly non-focal Psychological:  Alert and cooperative. Normal mood and affect  ASSESSMENT AND PLAN: *62 year old female with long-standing complaints of upper abdominal pain and bloating:  Symptoms date back at least 2 years.  Symptoms no worse than they have been in the past.  EGD, colonoscopy, and CT scan all within the past 1.5-2  years for the same symptoms were unremarkable.  Has tried several regimens for symptomatic management without much improvement.  I wonder if it is related to her chemo medication/side effect and we discussed this before.  ? Gallbladder source.  Will check abdominal ultrasound.     CC:  [name], MD   Addendum: Reviewed and agree with initial management. Pyrtle, Lajuan Lines, MD

## 2017-10-13 DIAGNOSIS — C9000 Multiple myeloma not having achieved remission: Secondary | ICD-10-CM | POA: Diagnosis not present

## 2017-10-13 DIAGNOSIS — M546 Pain in thoracic spine: Secondary | ICD-10-CM | POA: Diagnosis not present

## 2017-10-13 DIAGNOSIS — M8448XA Pathological fracture, other site, initial encounter for fracture: Secondary | ICD-10-CM | POA: Diagnosis not present

## 2017-10-13 DIAGNOSIS — M4000 Postural kyphosis, site unspecified: Secondary | ICD-10-CM | POA: Diagnosis not present

## 2017-10-17 ENCOUNTER — Ambulatory Visit (HOSPITAL_COMMUNITY): Payer: 59

## 2017-10-19 ENCOUNTER — Encounter: Payer: Self-pay | Admitting: Hematology & Oncology

## 2017-10-21 ENCOUNTER — Ambulatory Visit (HOSPITAL_COMMUNITY)
Admission: RE | Admit: 2017-10-21 | Discharge: 2017-10-21 | Disposition: A | Discharge status: Home / Self Care | Payer: 59 | Source: Ambulatory Visit | Attending: Gastroenterology | Admitting: Gastroenterology

## 2017-10-21 DIAGNOSIS — R1011 Right upper quadrant pain: Secondary | ICD-10-CM | POA: Insufficient documentation

## 2017-10-21 DIAGNOSIS — R1013 Epigastric pain: Secondary | ICD-10-CM

## 2017-10-21 DIAGNOSIS — R140 Abdominal distension (gaseous): Secondary | ICD-10-CM

## 2017-10-21 DIAGNOSIS — R1310 Dysphagia, unspecified: Secondary | ICD-10-CM | POA: Diagnosis not present

## 2017-10-24 ENCOUNTER — Other Ambulatory Visit: Payer: 59

## 2017-10-24 ENCOUNTER — Ambulatory Visit: Payer: 59 | Admitting: Hematology & Oncology

## 2017-10-24 ENCOUNTER — Ambulatory Visit: Payer: 59

## 2017-10-25 ENCOUNTER — Ambulatory Visit: Payer: 59

## 2017-10-31 ENCOUNTER — Encounter: Payer: Self-pay | Admitting: Hematology & Oncology

## 2017-10-31 ENCOUNTER — Inpatient Hospital Stay: Payer: 59

## 2017-10-31 ENCOUNTER — Other Ambulatory Visit: Payer: Self-pay

## 2017-10-31 ENCOUNTER — Inpatient Hospital Stay (HOSPITAL_BASED_OUTPATIENT_CLINIC_OR_DEPARTMENT_OTHER): Payer: 59 | Admitting: Hematology & Oncology

## 2017-10-31 VITALS — BP 130/64 | HR 64 | Temp 97.5°F | Resp 18 | Wt 168.5 lb

## 2017-10-31 DIAGNOSIS — C9000 Multiple myeloma not having achieved remission: Secondary | ICD-10-CM

## 2017-10-31 DIAGNOSIS — Z5112 Encounter for antineoplastic immunotherapy: Secondary | ICD-10-CM | POA: Diagnosis not present

## 2017-10-31 DIAGNOSIS — C9002 Multiple myeloma in relapse: Secondary | ICD-10-CM

## 2017-10-31 LAB — CBC WITH DIFFERENTIAL (CANCER CENTER ONLY)
BASOS ABS: 0 10*3/uL (ref 0.0–0.1)
BASOS PCT: 1 %
EOS PCT: 3 %
Eosinophils Absolute: 0.1 10*3/uL (ref 0.0–0.5)
HEMATOCRIT: 32.1 % — AB (ref 34.8–46.6)
Hemoglobin: 10.5 g/dL — ABNORMAL LOW (ref 11.6–15.9)
Lymphocytes Relative: 31 %
Lymphs Abs: 0.6 10*3/uL — ABNORMAL LOW (ref 0.9–3.3)
MCH: 30.3 pg (ref 26.0–34.0)
MCHC: 32.7 g/dL (ref 32.0–36.0)
MCV: 92.5 fL (ref 81.0–101.0)
MONO ABS: 0.3 10*3/uL (ref 0.1–0.9)
Monocytes Relative: 18 %
NEUTROS ABS: 0.9 10*3/uL — AB (ref 1.5–6.5)
Neutrophils Relative %: 47 %
Platelet Count: 125 10*3/uL — ABNORMAL LOW (ref 145–400)
RBC: 3.47 MIL/uL — AB (ref 3.70–5.32)
RDW: 13.8 % (ref 11.1–15.7)
WBC: 1.9 10*3/uL — AB (ref 3.9–10.0)

## 2017-10-31 LAB — CMP (CANCER CENTER ONLY)
ALT: 26 U/L (ref 10–47)
ANION GAP: 11 (ref 5–15)
AST: 29 U/L (ref 11–38)
Albumin: 4 g/dL (ref 3.5–5.0)
Alkaline Phosphatase: 52 U/L (ref 26–84)
BILIRUBIN TOTAL: 0.8 mg/dL (ref 0.2–1.6)
BUN: 23 mg/dL — ABNORMAL HIGH (ref 7–22)
CALCIUM: 9.3 mg/dL (ref 8.0–10.3)
CO2: 28 mmol/L (ref 18–33)
Chloride: 106 mmol/L (ref 98–108)
Creatinine: 1.1 mg/dL (ref 0.60–1.20)
Glucose, Bld: 93 mg/dL (ref 73–118)
Potassium: 4 mmol/L (ref 3.3–4.7)
Sodium: 145 mmol/L (ref 128–145)
Total Protein: 7.3 g/dL (ref 6.4–8.1)

## 2017-10-31 MED ORDER — SODIUM CHLORIDE 0.9 % IV SOLN: Freq: Once | INTRAVENOUS | Status: AC

## 2017-10-31 MED ORDER — PALONOSETRON HCL INJECTION 0.25 MG/5ML
INTRAVENOUS | Status: AC
  Filled 2017-10-31: qty 5

## 2017-10-31 MED ORDER — HEPARIN SOD (PORK) LOCK FLUSH 100 UNIT/ML IV SOLN
500.0000 [IU] | Freq: Once | INTRAVENOUS | Status: AC | PRN
  Administered 2017-10-31: 500 [IU]
  Filled 2017-10-31: qty 5

## 2017-10-31 MED ORDER — DEXAMETHASONE SODIUM PHOSPHATE 10 MG/ML IJ SOLN
10.0000 mg | Freq: Once | INTRAMUSCULAR | Status: DC
  Administered 2017-10-31: 10 mg via INTRAVENOUS

## 2017-10-31 MED ORDER — PALONOSETRON HCL INJECTION 0.25 MG/5ML
0.2500 mg | Freq: Once | INTRAVENOUS | Status: AC
  Administered 2017-10-31: 0.25 mg via INTRAVENOUS

## 2017-10-31 MED ORDER — SODIUM CHLORIDE 0.9 % IV SOLN
Freq: Once | INTRAVENOUS | Status: DC
  Administered 2017-10-31: 09:00:00 via INTRAVENOUS

## 2017-10-31 MED ORDER — DEXTROSE 5 % IV SOLN
70.0000 mg | Freq: Once | INTRAVENOUS | Status: AC
  Administered 2017-10-31: 70 mg via INTRAVENOUS
  Filled 2017-10-31: qty 30

## 2017-10-31 MED ORDER — DEXAMETHASONE SODIUM PHOSPHATE 10 MG/ML IJ SOLN: 10.0000 mg | Freq: Once | INTRAMUSCULAR | Status: AC

## 2017-10-31 MED ORDER — CYCLOPHOSPHAMIDE CHEMO INJECTION 1 GM
180.0000 mg/m2 | Freq: Once | INTRAMUSCULAR | Status: AC
  Administered 2017-10-31: 340 mg via INTRAVENOUS
  Filled 2017-10-31: qty 17

## 2017-10-31 MED ORDER — SODIUM CHLORIDE 0.9 % IV SOLN
Freq: Once | INTRAVENOUS | Status: AC
  Administered 2017-10-31: 09:00:00 via INTRAVENOUS

## 2017-10-31 MED ORDER — DEXAMETHASONE SODIUM PHOSPHATE 10 MG/ML IJ SOLN
INTRAMUSCULAR | Status: AC
  Filled 2017-10-31: qty 1

## 2017-10-31 MED ORDER — SODIUM CHLORIDE 0.9% FLUSH
10.0000 mL | INTRAVENOUS | Status: DC | PRN
  Administered 2017-10-31: 10 mL
  Filled 2017-10-31: qty 10

## 2017-10-31 MED ORDER — SODIUM CHLORIDE 0.9 % IJ SOLN
10.0000 mL | Freq: Once | INTRAMUSCULAR | Status: AC
  Administered 2017-10-31: 10 mL
  Filled 2017-10-31: qty 10

## 2017-10-31 MED ORDER — DEXTROSE 5 % IV SOLN: 36.0000 mg/m2 | Freq: Once | INTRAVENOUS | Status: DC

## 2017-10-31 NOTE — Progress Notes (Signed)
Ok to treat with ANC 0.9 per Dr. Ennever.  

## 2017-10-31 NOTE — Patient Instructions (Signed)
Implanted Port Home Guide An implanted port is a type of central line that is placed under the skin. Central lines are used to provide IV access when treatment or nutrition needs to be given through a person's veins. Implanted ports are used for long-term IV access. An implanted port may be placed because:  You need IV medicine that would be irritating to the small veins in your hands or arms.  You need long-term IV medicines, such as antibiotics.  You need IV nutrition for a long period.  You need frequent blood draws for lab tests.  You need dialysis.  Implanted ports are usually placed in the chest area, but they can also be placed in the upper arm, the abdomen, or the leg. An implanted port has two main parts:  Reservoir. The reservoir is round and will appear as a small, raised area under your skin. The reservoir is the part where a needle is inserted to give medicines or draw blood.  Catheter. The catheter is a thin, flexible tube that extends from the reservoir. The catheter is placed into a large vein. Medicine that is inserted into the reservoir goes into the catheter and then into the vein.  How will I care for my incision site? Do not get the incision site wet. Bathe or shower as directed by your health care provider. How is my port accessed? Special steps must be taken to access the port:  Before the port is accessed, a numbing cream can be placed on the skin. This helps numb the skin over the port site.  Your health care provider uses a sterile technique to access the port. ? Your health care provider must put on a mask and sterile gloves. ? The skin over your port is cleaned carefully with an antiseptic and allowed to dry. ? The port is gently pinched between sterile gloves, and a needle is inserted into the port.  Only "non-coring" port needles should be used to access the port. Once the port is accessed, a blood return should be checked. This helps ensure that the port  is in the vein and is not clogged.  If your port needs to remain accessed for a constant infusion, a clear (transparent) bandage will be placed over the needle site. The bandage and needle will need to be changed every week, or as directed by your health care provider.  Keep the bandage covering the needle clean and dry. Do not get it wet. Follow your health care provider's instructions on how to take a shower or bath while the port is accessed.  If your port does not need to stay accessed, no bandage is needed over the port.  What is flushing? Flushing helps keep the port from getting clogged. Follow your health care provider's instructions on how and when to flush the port. Ports are usually flushed with saline solution or a medicine called heparin. The need for flushing will depend on how the port is used.  If the port is used for intermittent medicines or blood draws, the port will need to be flushed: ? After medicines have been given. ? After blood has been drawn. ? As part of routine maintenance.  If a constant infusion is running, the port may not need to be flushed.  How long will my port stay implanted? The port can stay in for as long as your health care provider thinks it is needed. When it is time for the port to come out, surgery will be   done to remove it. The procedure is similar to the one performed when the port was put in. When should I seek immediate medical care? When you have an implanted port, you should seek immediate medical care if:  You notice a bad smell coming from the incision site.  You have swelling, redness, or drainage at the incision site.  You have more swelling or pain at the port site or the surrounding area.  You have a fever that is not controlled with medicine.  This information is not intended to replace advice given to you by your health care provider. Make sure you discuss any questions you have with your health care provider. Document  Released: 04/26/2005 Document Revised: 10/02/2015 Document Reviewed: 01/01/2013 Elsevier Interactive Patient Education  2017 [employer]  

## 2017-10-31 NOTE — Patient Instructions (Signed)
[facility] Discharge Instructions for Patients Receiving Chemotherapy  Today you received the following chemotherapy agents:  Kyprolis & Cytoxan  To help prevent nausea and vomiting after your treatment, we encourage you to take your nausea medication as prescribed.    If you develop nausea and vomiting that is not controlled by your nausea medication, call the clinic.   BELOW ARE SYMPTOMS THAT SHOULD BE REPORTED IMMEDIATELY:  *FEVER GREATER THAN 100.5 F  *CHILLS WITH OR WITHOUT FEVER  NAUSEA AND VOMITING THAT IS NOT CONTROLLED WITH YOUR NAUSEA MEDICATION  *UNUSUAL SHORTNESS OF BREATH  *UNUSUAL BRUISING OR BLEEDING  TENDERNESS IN MOUTH AND THROAT WITH OR WITHOUT PRESENCE OF ULCERS  *URINARY PROBLEMS  *BOWEL PROBLEMS  UNUSUAL RASH Items with * indicate a potential emergency and should be followed up as soon as possible.  Feel free to call the clinic should you have any questions or concerns. The clinic phone number is [phone].  Please show the CHEMO ALERT CARD at check-in to the Emergency Department and triage nurse.   

## 2017-10-31 NOTE — Progress Notes (Signed)
Hematology and Oncology Follow Up Visit  Barbara Cook [id number] 1956/04/03 62 y.o. 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  Everything is going quite well for her.  She did see her neurosurgeon.  He has had the really was not much that he could do for her back.  I think that for her, the best that can be done is for her to do a lot of walking.  She actually looks quite good.  She did have a nice weekend.  She does have IBS.  This sometimes causes diarrhea.  Her monoclonal levels have been holding steady.  We last saw her, her M spike was 0.7 g/dL.  Her IgG level was 1200 mg/dL.  Her kappa light chain was 1.3 mg/dL.  She is had no fever.  She has had no cough.  She still is bothered by some back discomfort.  She will see her neurosurgeon in a week or so.  She has had no rashes.  Is been no leg swelling.  She has had no problems with bowels or bladder.  She has had no diarrhea.  Overall, her performance status is ECOG 1.    Medications:  Allergies as of 10/31/2017      Reactions   Codeine Palpitations      Medication List        Accurate as of 10/31/17  2:34 PM. Always use your most recent med list.          acetaminophen 500 MG tablet Commonly known as:  TYLENOL Take 500 mg by mouth every 6 (six) hours as needed for mild pain or headache.   acyclovir 400 MG tablet Commonly known as:  ZOVIRAX Take 1 tablet (400 mg total) by mouth daily.   B-6 250 MG Tabs TAKE 1 TABLET (250 MG  TOTAL) BY MOUTH DAILY.   hyoscyamine 0.125 MG SL tablet Commonly known as:  LEVSIN SL PLACE 1 TABLET (0.125 MG TOTAL) UNDER THE TONGUE EVERY 4 (FOUR) HOURS AS NEEDED.   magnesium oxide 400 (241.3 Mg) MG tablet Commonly known as:  MAG-OX TAKE 1 TABLET BY MOUTH TWICE A DAY   multivitamin tablet Take 1 tablet by mouth every evening.   ondansetron 8 MG tablet Commonly known as:  ZOFRAN TAKE 1 TABLET BY MOUTH TWICE A DAY FOR NAUSEA AND VOMITING 1 TABLET 1 HOUR PRIOR TO CHEMOTHERAPY   pantoprazole 40 MG tablet Commonly known as:  PROTONIX Take 1 tablet (40 mg total) by mouth daily.   polyethylene glycol powder powder Commonly known as:  GLYCOLAX/MIRALAX 1 capful daily as needed   prochlorperazine 10 MG tablet Commonly known as:  COMPAZINE Take 1 tablet (10 mg total) by mouth every 6 (six) hours as needed for nausea or vomiting.   torsemide 20 MG tablet Commonly known as:  DEMADEX Take 1 tablet (20 mg total) by mouth daily as needed.   traMADol 50 MG tablet Commonly known as:  ULTRAM Take 1 tablet (50 mg total) every 6 (six) hours as needed by  mouth.   Vitamin D (Ergocalciferol) 50000 units Caps capsule Commonly known as:  DRISDOL TAKE 1 CAPSULE (50,000 UNITS TOTAL) BY MOUTH ONCE A WEEK.       Allergies:  Allergies  Allergen Reactions  . Codeine Palpitations    Past Medical History, Surgical history, Social history, and Family History were reviewed and updated.  Review of Systems: Review of Systems  Constitutional: Negative.   HENT: Negative.   Eyes: Negative.   Respiratory: Negative.   Cardiovascular: Negative.   Gastrointestinal: Negative.  Abdominal pain: .phys.  Genitourinary: Negative.   Musculoskeletal: Negative.   Skin: Negative.   Neurological: Negative.   Endo/Heme/Allergies: Negative.   Psychiatric/Behavioral: Negative.      Physical Exam:  weight is 168 lb 8 oz (76.4 kg). Her oral temperature is 97.5 F (36.4 C) (abnormal). Her blood pressure  is 130/64 and her pulse is 64. Her respiration is 18 and oxygen saturation is 100%.   Wt Readings from Last 3 Encounters:  10/31/17 168 lb 8 oz (76.4 kg)  10/31/17 168 lb 8 oz (76.4 kg)  10/12/17 174 lb 8 oz (79.2 kg)    Physical Exam  Constitutional: She is oriented to person, place, and time.  HENT:  Head: Normocephalic and atraumatic.  Mouth/Throat: Oropharynx is clear and moist.  Eyes: Pupils are equal, round, and reactive to light. EOM are normal.  Neck: Normal range of motion.  Cardiovascular: Normal rate, regular rhythm and normal heart sounds.  Pulmonary/Chest: Effort normal and breath sounds normal.  Abdominal: Soft. Bowel sounds are normal.  Musculoskeletal: Normal range of motion. She exhibits no edema, tenderness or deformity.  Lymphadenopathy:    She has no cervical adenopathy.  Neurological: She is alert and oriented to person, place, and time.  Skin: Skin is warm and dry. No rash noted. No erythema.  Psychiatric: She has a normal mood and affect. Her behavior is normal. Judgment and thought content normal.  Vitals reviewed.    Lab Results  Component Value Date   WBC 1.9 (L) 10/31/2017   HGB 10.5 (L) 10/31/2017   HCT 32.1 (L) 10/31/2017   MCV 92.5 10/31/2017   PLT 125 (L) 10/31/2017   Lab Results  Component Value Date   FERRITIN 1,083 (H) 08/29/2017   IRON 72 08/29/2017   TIBC 251 08/29/2017   UIBC 179 08/29/2017   IRONPCTSAT 29 08/29/2017   Lab Results  Component Value Date   RETICCTPCT 1.1 07/18/2017   RBC 3.47 (L) 10/31/2017   RETICCTABS 35.9 12/25/2014   Lab Results  Component Value Date   KPAFRELGTCHN 12.5 09/26/2017   LAMBDASER 6.5 09/26/2017   KAPLAMBRATIO 1.92 (H) 09/26/2017   Lab Results  Component Value Date   IGGSERUM 1,201 09/26/2017   IGGSERUM 1,202 09/26/2017   IGA 21 (L) 09/26/2017   IGA 21 (L) 09/26/2017   IGMSERUM 9 (L) 09/26/2017   IGMSERUM 8 (L) 09/26/2017   Lab Results  Component Value Date   TOTALPROTELP 6.3  09/26/2017   ALBUMINELP 3.7 09/26/2017   A1GS 0.2 09/26/2017   A2GS 0.5 09/26/2017   BETS 0.9 09/26/2017   BETA2SER 0.3 05/07/2015   GAMS 1.0 09/26/2017   MSPIKE 0.7 (H) 09/26/2017   SPEI * 05/07/2015     Chemistry      Component Value Date/Time   NA 145 10/31/2017 0805   NA 143 05/09/2017 0812   NA 139 02/20/2016 1207   K 4.0 10/31/2017 0805   K 4.0 05/09/2017 0812   K 3.8 02/20/2016 1207  CL 106 10/31/2017 0805   CL 108 05/09/2017 0812   CO2 28 10/31/2017 0805   CO2 26 05/09/2017 0812   CO2 29 02/20/2016 1207   BUN 23 (H) 10/31/2017 0805   BUN 13 05/09/2017 0812   BUN 22.3 02/20/2016 1207   CREATININE 1.10 10/31/2017 0805   CREATININE 0.9 05/09/2017 0812   CREATININE 1.3 (H) 02/20/2016 1207      Component Value Date/Time   CALCIUM 9.3 10/31/2017 0805   CALCIUM 8.3 05/09/2017 0812   CALCIUM 9.7 02/20/2016 1207   ALKPHOS 52 10/31/2017 0805   ALKPHOS 123 (H) 05/09/2017 0812   ALKPHOS 62 02/20/2016 1207   AST 29 10/31/2017 0805   AST 27 02/20/2016 1207   ALT 26 10/31/2017 0805   ALT 26 05/09/2017 0812   ALT 19 02/20/2016 1207   BILITOT 0.8 10/31/2017 0805   BILITOT 0.44 02/20/2016 1207      Impression and Plan: Ms. Hattabaugh is a very pleasant 62 yo African American female with IgG kappa myeloma.  Everything is holding quite stable.  I am going to try to move her treatments out to every 3 weeks now.  I think we might be able to help her.  As long as her myeloma levels are less than 1.0 with her M spike, I does do not see that she is going to have a problem.  We will go ahead and plan to get her back in 3 weeks.    [name], MD 6/24/20192:34 PM

## 2017-11-01 ENCOUNTER — Other Ambulatory Visit: Payer: Self-pay

## 2017-11-01 ENCOUNTER — Inpatient Hospital Stay: Payer: 59

## 2017-11-01 DIAGNOSIS — C9000 Multiple myeloma not having achieved remission: Secondary | ICD-10-CM

## 2017-11-01 DIAGNOSIS — Z5112 Encounter for antineoplastic immunotherapy: Secondary | ICD-10-CM | POA: Diagnosis not present

## 2017-11-01 DIAGNOSIS — C9002 Multiple myeloma in relapse: Secondary | ICD-10-CM

## 2017-11-01 LAB — IGG, IGA, IGM
IGA: 26 mg/dL — AB (ref 87–352)
IGM (IMMUNOGLOBULIN M), SRM: 8 mg/dL — AB (ref 26–217)
IgG (Immunoglobin G), Serum: 1284 mg/dL (ref 700–1600)

## 2017-11-01 LAB — KAPPA/LAMBDA LIGHT CHAINS
KAPPA, LAMDA LIGHT CHAIN RATIO: 2.61 — AB (ref 0.26–1.65)
Kappa free light chain: 20.9 mg/L — ABNORMAL HIGH (ref 3.3–19.4)
Lambda free light chains: 8 mg/L (ref 5.7–26.3)

## 2017-11-01 MED ORDER — HEPARIN SOD (PORK) LOCK FLUSH 100 UNIT/ML IV SOLN
500.0000 [IU] | Freq: Once | INTRAVENOUS | Status: AC | PRN
  Administered 2017-11-01: 500 [IU]
  Filled 2017-11-01: qty 5

## 2017-11-01 MED ORDER — DEXTROSE 5 % IV SOLN
70.0000 mg | Freq: Once | INTRAVENOUS | Status: AC
  Administered 2017-11-01: 70 mg via INTRAVENOUS
  Filled 2017-11-01: qty 30

## 2017-11-01 MED ORDER — SODIUM CHLORIDE 0.9 % IV SOLN
Freq: Once | INTRAVENOUS | Status: AC
  Administered 2017-11-01: 09:00:00 via INTRAVENOUS

## 2017-11-01 MED ORDER — SODIUM CHLORIDE 0.9% FLUSH
10.0000 mL | INTRAVENOUS | Status: DC | PRN
  Administered 2017-11-01: 10 mL
  Filled 2017-11-01: qty 10

## 2017-11-01 MED ORDER — DEXAMETHASONE SODIUM PHOSPHATE 10 MG/ML IJ SOLN
INTRAMUSCULAR | Status: AC
  Filled 2017-11-01: qty 1

## 2017-11-01 MED ORDER — DEXAMETHASONE SODIUM PHOSPHATE 10 MG/ML IJ SOLN
10.0000 mg | Freq: Once | INTRAMUSCULAR | Status: AC
  Administered 2017-11-01: 10 mg via INTRAVENOUS

## 2017-11-01 NOTE — Patient Instructions (Signed)
[facility] Discharge Instructions for Patients Receiving Chemotherapy  Today you received the following chemotherapy agents:  Kyprolis  To help prevent nausea and vomiting after your treatment, we encourage you to take your nausea medication as prescribed.   If you develop nausea and vomiting that is not controlled by your nausea medication, call the clinic.   BELOW ARE SYMPTOMS THAT SHOULD BE REPORTED IMMEDIATELY:  *FEVER GREATER THAN 100.5 F  *CHILLS WITH OR WITHOUT FEVER  NAUSEA AND VOMITING THAT IS NOT CONTROLLED WITH YOUR NAUSEA MEDICATION  *UNUSUAL SHORTNESS OF BREATH  *UNUSUAL BRUISING OR BLEEDING  TENDERNESS IN MOUTH AND THROAT WITH OR WITHOUT PRESENCE OF ULCERS  *URINARY PROBLEMS  *BOWEL PROBLEMS  UNUSUAL RASH Items with * indicate a potential emergency and should be followed up as soon as possible.  Feel free to call the clinic should you have any questions or concerns. The clinic phone number is [phone].  Please show the CHEMO ALERT CARD at check-in to the Emergency Department and triage nurse.   

## 2017-11-02 LAB — IMMUNOFIXATION REFLEX, SERUM
IGM (IMMUNOGLOBULIN M), SRM: 9 mg/dL — AB (ref 26–217)
IgA: 27 mg/dL — ABNORMAL LOW (ref 87–352)
IgG (Immunoglobin G), Serum: 1296 mg/dL (ref 700–1600)

## 2017-11-02 LAB — PROTEIN ELECTROPHORESIS, SERUM, WITH REFLEX
A/G Ratio: 1.5 (ref 0.7–1.7)
Albumin ELP: 4 g/dL (ref 2.9–4.4)
Alpha-1-Globulin: 0.2 g/dL (ref 0.0–0.4)
Alpha-2-Globulin: 0.5 g/dL (ref 0.4–1.0)
Beta Globulin: 0.9 g/dL (ref 0.7–1.3)
Gamma Globulin: 1.1 g/dL (ref 0.4–1.8)
Globulin, Total: 2.7 g/dL (ref 2.2–3.9)
M-Spike, %: 0.7 g/dL — ABNORMAL HIGH
SPEP Interpretation: 0
Total Protein ELP: 6.7 g/dL (ref 6.0–8.5)

## 2017-11-04 MED ORDER — DIPHENHYDRAMINE HCL 25 MG PO CAPS
ORAL_CAPSULE | ORAL | Status: AC
  Filled 2017-11-04: qty 1

## 2017-11-04 MED ORDER — ACETAMINOPHEN 325 MG PO TABS
ORAL_TABLET | ORAL | Status: AC
  Filled 2017-11-04: qty 2

## 2017-11-07 ENCOUNTER — Other Ambulatory Visit: Payer: 59

## 2017-11-07 ENCOUNTER — Ambulatory Visit: Payer: 59

## 2017-11-08 ENCOUNTER — Ambulatory Visit: Payer: 59

## 2017-11-14 ENCOUNTER — Ambulatory Visit: Payer: 59 | Admitting: Hematology & Oncology

## 2017-11-14 ENCOUNTER — Other Ambulatory Visit: Payer: 59

## 2017-11-14 ENCOUNTER — Ambulatory Visit: Payer: 59

## 2017-11-15 ENCOUNTER — Ambulatory Visit: Payer: 59

## 2017-11-21 ENCOUNTER — Inpatient Hospital Stay: Payer: 59

## 2017-11-21 ENCOUNTER — Other Ambulatory Visit: Payer: Self-pay

## 2017-11-21 ENCOUNTER — Ambulatory Visit: Payer: 59

## 2017-11-21 ENCOUNTER — Other Ambulatory Visit: Payer: 59

## 2017-11-21 ENCOUNTER — Encounter: Payer: Self-pay | Admitting: Family

## 2017-11-21 ENCOUNTER — Inpatient Hospital Stay: Payer: 59 | Attending: Hematology & Oncology | Admitting: Family

## 2017-11-21 VITALS — BP 111/58 | HR 77 | Temp 98.1°F | Resp 17 | Wt 167.0 lb

## 2017-11-21 DIAGNOSIS — C9000 Multiple myeloma not having achieved remission: Secondary | ICD-10-CM

## 2017-11-21 DIAGNOSIS — C9002 Multiple myeloma in relapse: Secondary | ICD-10-CM

## 2017-11-21 DIAGNOSIS — Z5111 Encounter for antineoplastic chemotherapy: Secondary | ICD-10-CM | POA: Insufficient documentation

## 2017-11-21 DIAGNOSIS — D500 Iron deficiency anemia secondary to blood loss (chronic): Secondary | ICD-10-CM

## 2017-11-21 DIAGNOSIS — M899 Disorder of bone, unspecified: Secondary | ICD-10-CM

## 2017-11-21 DIAGNOSIS — Z5112 Encounter for antineoplastic immunotherapy: Secondary | ICD-10-CM | POA: Insufficient documentation

## 2017-11-21 DIAGNOSIS — G629 Polyneuropathy, unspecified: Secondary | ICD-10-CM

## 2017-11-21 LAB — FERRITIN: Ferritin: 647 ng/mL — ABNORMAL HIGH (ref 11–307)

## 2017-11-21 LAB — CBC WITH DIFFERENTIAL (CANCER CENTER ONLY)
BASOS PCT: 1 %
Basophils Absolute: 0 10*3/uL (ref 0.0–0.1)
EOS ABS: 0.1 10*3/uL (ref 0.0–0.5)
EOS PCT: 3 %
HCT: 30.9 % — ABNORMAL LOW (ref 34.8–46.6)
Hemoglobin: 10.2 g/dL — ABNORMAL LOW (ref 11.6–15.9)
Lymphocytes Relative: 28 %
Lymphs Abs: 0.6 10*3/uL — ABNORMAL LOW (ref 0.9–3.3)
MCH: 30.7 pg (ref 26.0–34.0)
MCHC: 33 g/dL (ref 32.0–36.0)
MCV: 93.1 fL (ref 81.0–101.0)
Monocytes Absolute: 0.3 10*3/uL (ref 0.1–0.9)
Monocytes Relative: 14 %
Neutro Abs: 1.1 10*3/uL — ABNORMAL LOW (ref 1.5–6.5)
Neutrophils Relative %: 54 %
PLATELETS: 129 10*3/uL — AB (ref 145–400)
RBC: 3.32 MIL/uL — AB (ref 3.70–5.32)
RDW: 13.2 % (ref 11.1–15.7)
WBC: 2 10*3/uL — AB (ref 3.9–10.0)

## 2017-11-21 LAB — CMP (CANCER CENTER ONLY)
ALT: 22 U/L (ref 10–47)
AST: 26 U/L (ref 11–38)
Albumin: 3.7 g/dL (ref 3.5–5.0)
Alkaline Phosphatase: 46 U/L (ref 26–84)
Anion gap: 6 (ref 5–15)
BUN: 24 mg/dL — ABNORMAL HIGH (ref 7–22)
CHLORIDE: 105 mmol/L (ref 98–108)
CO2: 29 mmol/L (ref 18–33)
Calcium: 9.4 mg/dL (ref 8.0–10.3)
Creatinine: 1 mg/dL (ref 0.60–1.20)
GLUCOSE: 132 mg/dL — AB (ref 73–118)
POTASSIUM: 3.9 mmol/L (ref 3.3–4.7)
SODIUM: 140 mmol/L (ref 128–145)
Total Bilirubin: 0.8 mg/dL (ref 0.2–1.6)
Total Protein: 7 g/dL (ref 6.4–8.1)

## 2017-11-21 LAB — IRON AND TIBC
Iron: 83 ug/dL (ref 41–142)
SATURATION RATIOS: 33 % (ref 21–57)
TIBC: 255 ug/dL (ref 236–444)
UIBC: 172 ug/dL

## 2017-11-21 LAB — SAVE SMEAR

## 2017-11-21 MED ORDER — DEXAMETHASONE SODIUM PHOSPHATE 10 MG/ML IJ SOLN
10.0000 mg | Freq: Once | INTRAMUSCULAR | Status: AC
  Administered 2017-11-21: 10 mg via INTRAVENOUS

## 2017-11-21 MED ORDER — SODIUM CHLORIDE 0.9 % IV SOLN
Freq: Once | INTRAVENOUS | Status: AC
  Administered 2017-11-21: 11:00:00 via INTRAVENOUS

## 2017-11-21 MED ORDER — HEPARIN SOD (PORK) LOCK FLUSH 100 UNIT/ML IV SOLN
500.0000 [IU] | Freq: Once | INTRAVENOUS | Status: AC | PRN
  Administered 2017-11-21: 500 [IU]
  Filled 2017-11-21: qty 5

## 2017-11-21 MED ORDER — SODIUM CHLORIDE 0.9 % IV SOLN
180.0000 mg/m2 | Freq: Once | INTRAVENOUS | Status: AC
  Administered 2017-11-21: 340 mg via INTRAVENOUS
  Filled 2017-11-21: qty 17

## 2017-11-21 MED ORDER — DEXTROSE 5 % IV SOLN
37.0000 mg/m2 | Freq: Once | INTRAVENOUS | Status: AC
  Administered 2017-11-21: 70 mg via INTRAVENOUS
  Filled 2017-11-21: qty 30

## 2017-11-21 MED ORDER — PALONOSETRON HCL INJECTION 0.25 MG/5ML
INTRAVENOUS | Status: AC
  Filled 2017-11-21: qty 5

## 2017-11-21 MED ORDER — SODIUM CHLORIDE 0.9% FLUSH
10.0000 mL | INTRAVENOUS | Status: DC | PRN
  Administered 2017-11-21: 10 mL
  Filled 2017-11-21: qty 10

## 2017-11-21 MED ORDER — DEXAMETHASONE SODIUM PHOSPHATE 10 MG/ML IJ SOLN
INTRAMUSCULAR | Status: AC
  Filled 2017-11-21: qty 1

## 2017-11-21 MED ORDER — PALONOSETRON HCL INJECTION 0.25 MG/5ML
0.2500 mg | Freq: Once | INTRAVENOUS | Status: AC
  Administered 2017-11-21: 0.25 mg via INTRAVENOUS

## 2017-11-21 MED ORDER — SODIUM CHLORIDE 0.9 % IV SOLN
Freq: Once | INTRAVENOUS | Status: AC
  Administered 2017-11-21: 10:00:00 via INTRAVENOUS

## 2017-11-21 NOTE — Patient Instructions (Signed)
[facility] Discharge Instructions for Patients Receiving Chemotherapy  Today you received the following chemotherapy agents Kyprolis To help prevent nausea and vomiting after your treatment, we encourage you to take your nausea medication as prescribed.  NO ZOFRAN FOR 3 DAYS AFTER CHEMO.  If you develop nausea and vomiting that is not controlled by your nausea medication, call the clinic.   BELOW ARE SYMPTOMS THAT SHOULD BE REPORTED IMMEDIATELY:  *FEVER GREATER THAN 100.5 F  *CHILLS WITH OR WITHOUT FEVER  NAUSEA AND VOMITING THAT IS NOT CONTROLLED WITH YOUR NAUSEA MEDICATION  *UNUSUAL SHORTNESS OF BREATH  *UNUSUAL BRUISING OR BLEEDING  TENDERNESS IN MOUTH AND THROAT WITH OR WITHOUT PRESENCE OF ULCERS  *URINARY PROBLEMS  *BOWEL PROBLEMS  UNUSUAL RASH Items with * indicate a potential emergency and should be followed up as soon as possible.  Feel free to call the clinic should you have any questions or concerns. The clinic phone number is [phone].  Please show the [facility] at check-in to the Emergency Department and triage nurse.

## 2017-11-21 NOTE — Progress Notes (Signed)
Hematology and Oncology Follow Up Visit  Barbara Cook [id number] 1956-03-12 62 y.o. 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 She is doing well and has no complaints at this time. Hgb is 10.2, MCV 93.  Her M-spike in June was stable at 0.7, IgG level was 1,284 mg/dL and kappa light chains is 2.09 mg/dL.  No fever, chills, n/v, cough, rash, dizziness, SOB, chest pain, palpitations, abdominal pain or changes in bowel (IBS) or bladder habits.  Neuropathy in her feet is unchanged. She has chronic puffiness in her feet and ankles that waxes and wanes. This is unchanged.  No episodes of bleeding, no bruising or petechiae. No lymphadenopathy noted on exam.  She continues to have a good appetite and is staying hydrated. Her weight is stable.   ECOG Performance Status: 1 - Symptomatic but completely ambulatory  Medications:  Allergies as of 11/21/2017      Reactions   Codeine Palpitations      Medication List        Accurate as of 11/21/17 11:26 AM. Always use your most recent med list.          acetaminophen 500 MG tablet Commonly known as:  TYLENOL Take 500 mg by mouth every 6 (six) hours as needed for mild pain or headache.   acyclovir 400 MG tablet Commonly known as:  ZOVIRAX Take 1 tablet (400 mg total) by mouth daily.   B-6 250 MG Tabs TAKE 1 TABLET (250 MG TOTAL) BY MOUTH DAILY.   hyoscyamine 0.125 MG SL tablet Commonly known as:  LEVSIN SL PLACE 1 TABLET (0.125 MG  TOTAL) UNDER THE TONGUE EVERY 4 (FOUR) HOURS AS NEEDED.   magnesium oxide 400 (241.3 Mg) MG tablet Commonly known as:  MAG-OX TAKE 1 TABLET BY MOUTH TWICE A DAY   multivitamin tablet Take 1 tablet by mouth every evening.   ondansetron 8 MG tablet Commonly known as:  ZOFRAN TAKE 1 TABLET BY MOUTH TWICE A DAY FOR NAUSEA AND VOMITING 1 TABLET 1 HOUR PRIOR TO CHEMOTHERAPY   pantoprazole 40 MG tablet Commonly known as:  PROTONIX Take 1 tablet (40 mg total) by mouth daily.   polyethylene glycol powder powder Commonly known as:  GLYCOLAX/MIRALAX 1 capful daily as needed   prochlorperazine 10 MG tablet Commonly known as:  COMPAZINE Take 1 tablet (10 mg total) by mouth every 6 (six) hours as needed for nausea or vomiting.   torsemide 20 MG tablet Commonly known as:  DEMADEX Take 1 tablet (20 mg total) by mouth daily as needed.   traMADol 50 MG tablet Commonly known as:  ULTRAM Take 1 tablet (50 mg total) every 6 (six) hours as needed by mouth.   Vitamin D (Ergocalciferol) 50000 units Caps capsule Commonly known as:  DRISDOL TAKE 1 CAPSULE (50,000 UNITS TOTAL) BY MOUTH ONCE A WEEK.       Allergies:  Allergies  Allergen Reactions  . Codeine Palpitations    Past Medical History, Surgical history, Social history, and Family  History were reviewed and updated.  Review of Systems: All other 10 point review of systems is negative.   Physical Exam:  weight is 167 lb (75.8 kg). Her oral temperature is 98.1 F (36.7 C). Her blood pressure is 111/58 (abnormal) and her pulse is 77. Her respiration is 17 and oxygen saturation is 100%.   Wt Readings from Last 3 Encounters:  11/21/17 167 lb (75.8 kg)  10/31/17 168 lb 8 oz (76.4 kg)  10/31/17 168 lb 8 oz (76.4 kg)    Ocular: Sclerae unicteric, pupils equal, round and reactive to light Ear-nose-throat: Oropharynx clear, dentition fair Lymphatic: No cervical, supraclavicular or axillary adenopathy Lungs no rales or rhonchi, good  excursion bilaterally Heart regular rate and rhythm, no murmur appreciated Abd soft, nontender, positive bowel sounds, no liver or spleen tip palpated on exam, no fluid wave  MSK no focal spinal tenderness, no joint edema Neuro: non-focal, well-oriented, appropriate affect Breasts: Deferred   Lab Results  Component Value Date   WBC 2.0 (L) 11/21/2017   HGB 10.2 (L) 11/21/2017   HCT 30.9 (L) 11/21/2017   MCV 93.1 11/21/2017   PLT 129 (L) 11/21/2017   Lab Results  Component Value Date   FERRITIN 1,083 (H) 08/29/2017   IRON 72 08/29/2017   TIBC 251 08/29/2017   UIBC 179 08/29/2017   IRONPCTSAT 29 08/29/2017   Lab Results  Component Value Date   RETICCTPCT 1.1 07/18/2017   RBC 3.32 (L) 11/21/2017   RETICCTABS 35.9 12/25/2014   Lab Results  Component Value Date   KPAFRELGTCHN 20.9 (H) 10/31/2017   LAMBDASER 8.0 10/31/2017   KAPLAMBRATIO 2.61 (H) 10/31/2017   Lab Results  Component Value Date   IGGSERUM 1,284 10/31/2017   IGGSERUM 1,296 10/31/2017   IGA 26 (L) 10/31/2017   IGA 27 (L) 10/31/2017   IGMSERUM 8 (L) 10/31/2017   IGMSERUM 9 (L) 10/31/2017   Lab Results  Component Value Date   TOTALPROTELP 6.7 10/31/2017   ALBUMINELP 4.0 10/31/2017   A1GS 0.2 10/31/2017   A2GS 0.5 10/31/2017   BETS 0.9 10/31/2017   BETA2SER 0.3 05/07/2015   GAMS 1.1 10/31/2017   MSPIKE 0.7 (H) 10/31/2017   SPEI * 05/07/2015     Chemistry      Component Value Date/Time   NA 140 11/21/2017 0900   NA 143 05/09/2017 0812   NA 139 02/20/2016 1207   K 3.9 11/21/2017 0900   K 4.0 05/09/2017 0812   K 3.8 02/20/2016 1207   CL 105 11/21/2017 0900   CL 108 05/09/2017 0812   CO2 29 11/21/2017 0900   CO2 26 05/09/2017 0812   CO2 29 02/20/2016 1207   BUN 24 (H) 11/21/2017 0900   BUN 13 05/09/2017 0812   BUN 22.3 02/20/2016 1207   CREATININE 1.00 11/21/2017 0900   CREATININE 0.9 05/09/2017 0812   CREATININE 1.3 (H) 02/20/2016 1207      Component Value Date/Time   CALCIUM 9.4  11/21/2017 0900   CALCIUM 8.3 05/09/2017 0812   CALCIUM 9.7 02/20/2016 1207   ALKPHOS 46 11/21/2017 0900   ALKPHOS 123 (H) 05/09/2017 0812   ALKPHOS 62 02/20/2016 1207   AST 26 11/21/2017 0900   AST 27 02/20/2016 1207   ALT 22 11/21/2017 0900   ALT 26 05/09/2017 0812   ALT 19 02/20/2016 1207   BILITOT 0.8 11/21/2017 0900   BILITOT 0.44 02/20/2016 1207      Impression and Plan: Barbara Cook is a very pleasant 62 yo African  American female with IgG kappa myeloma. She continues to do well and her M-spike has remained stable.  We will proceed with treatment today as planned.  Hgb is 10.2 so no Aranesp needed this visit.  She prefers her Zometa on non treatment days to she will come back on Friday for that.  We will see what her iron studies show and bring her back in for infusion if needed.  She has her current treatment and appointment scheduled and will come back for follow-up and treatment on 12/12/17.  They will contact our office with any questions or concerns. We can certainly see her sooner if need be.   Laverna Peace, NP 7/15/201911:26 AM

## 2017-11-21 NOTE — Progress Notes (Signed)
Ok to treat with ANC today per Sarah NP  

## 2017-11-22 ENCOUNTER — Inpatient Hospital Stay: Payer: 59

## 2017-11-22 ENCOUNTER — Ambulatory Visit: Payer: 59

## 2017-11-22 ENCOUNTER — Other Ambulatory Visit: Payer: Self-pay

## 2017-11-22 DIAGNOSIS — C9002 Multiple myeloma in relapse: Secondary | ICD-10-CM

## 2017-11-22 DIAGNOSIS — Z5112 Encounter for antineoplastic immunotherapy: Secondary | ICD-10-CM | POA: Diagnosis not present

## 2017-11-22 DIAGNOSIS — C9000 Multiple myeloma not having achieved remission: Secondary | ICD-10-CM

## 2017-11-22 LAB — IGG, IGA, IGM
IGA: 25 mg/dL — AB (ref 87–352)
IGG (IMMUNOGLOBIN G), SERUM: 1260 mg/dL (ref 700–1600)
IgM (Immunoglobulin M), Srm: 8 mg/dL — ABNORMAL LOW (ref 26–217)

## 2017-11-22 LAB — KAPPA/LAMBDA LIGHT CHAINS
KAPPA FREE LGHT CHN: 16.8 mg/L (ref 3.3–19.4)
Kappa, lambda light chain ratio: 2.24 — ABNORMAL HIGH (ref 0.26–1.65)
LAMDA FREE LIGHT CHAINS: 7.5 mg/L (ref 5.7–26.3)

## 2017-11-22 MED ORDER — DEXAMETHASONE SODIUM PHOSPHATE 10 MG/ML IJ SOLN
10.0000 mg | Freq: Once | INTRAMUSCULAR | Status: AC
  Administered 2017-11-22: 10 mg via INTRAVENOUS

## 2017-11-22 MED ORDER — SODIUM CHLORIDE 0.9% FLUSH
10.0000 mL | INTRAVENOUS | Status: DC | PRN
  Administered 2017-11-22: 10 mL
  Filled 2017-11-22: qty 10

## 2017-11-22 MED ORDER — DEXTROSE 5 % IV SOLN
37.0000 mg/m2 | Freq: Once | INTRAVENOUS | Status: AC
  Administered 2017-11-22: 70 mg via INTRAVENOUS
  Filled 2017-11-22: qty 30

## 2017-11-22 MED ORDER — DEXAMETHASONE SODIUM PHOSPHATE 10 MG/ML IJ SOLN
INTRAMUSCULAR | Status: AC
  Filled 2017-11-22: qty 1

## 2017-11-22 MED ORDER — HEPARIN SOD (PORK) LOCK FLUSH 100 UNIT/ML IV SOLN
500.0000 [IU] | Freq: Once | INTRAVENOUS | Status: AC | PRN
  Administered 2017-11-22: 500 [IU]
  Filled 2017-11-22: qty 5

## 2017-11-22 MED ORDER — SODIUM CHLORIDE 0.9 % IV SOLN
Freq: Once | INTRAVENOUS | Status: AC
  Administered 2017-11-22: 08:00:00 via INTRAVENOUS

## 2017-11-22 MED ORDER — SODIUM CHLORIDE 0.9 % IV SOLN
Freq: Once | INTRAVENOUS | Status: AC
  Administered 2017-11-22: 09:00:00 via INTRAVENOUS

## 2017-11-24 LAB — IMMUNOFIXATION REFLEX, SERUM
IgA: 26 mg/dL — ABNORMAL LOW (ref 87–352)
IgG (Immunoglobin G), Serum: 1344 mg/dL (ref 700–1600)
IgM (Immunoglobulin M), Srm: 8 mg/dL — ABNORMAL LOW (ref 26–217)

## 2017-11-24 LAB — PROTEIN ELECTROPHORESIS, SERUM, WITH REFLEX
A/G Ratio: 1.2 (ref 0.7–1.7)
ALBUMIN ELP: 3.7 g/dL (ref 2.9–4.4)
ALPHA-1-GLOBULIN: 0.2 g/dL (ref 0.0–0.4)
ALPHA-2-GLOBULIN: 0.6 g/dL (ref 0.4–1.0)
BETA GLOBULIN: 0.9 g/dL (ref 0.7–1.3)
GAMMA GLOBULIN: 1.2 g/dL (ref 0.4–1.8)
Globulin, Total: 3 g/dL (ref 2.2–3.9)
M-Spike, %: 0.7 g/dL — ABNORMAL HIGH
SPEP Interpretation: 0
TOTAL PROTEIN ELP: 6.7 g/dL (ref 6.0–8.5)

## 2017-11-25 ENCOUNTER — Inpatient Hospital Stay: Payer: 59

## 2017-11-28 ENCOUNTER — Other Ambulatory Visit: Payer: 59

## 2017-11-28 ENCOUNTER — Ambulatory Visit: Payer: 59 | Admitting: Hematology & Oncology

## 2017-11-28 ENCOUNTER — Ambulatory Visit: Payer: 59

## 2017-11-29 ENCOUNTER — Ambulatory Visit: Payer: 59

## 2017-11-29 ENCOUNTER — Inpatient Hospital Stay: Payer: 59

## 2017-12-05 ENCOUNTER — Ambulatory Visit: Payer: 59

## 2017-12-05 ENCOUNTER — Other Ambulatory Visit: Payer: 59

## 2017-12-05 ENCOUNTER — Ambulatory Visit: Payer: 59 | Admitting: Hematology & Oncology

## 2017-12-06 ENCOUNTER — Ambulatory Visit: Payer: 59

## 2017-12-12 ENCOUNTER — Telehealth: Payer: Self-pay | Admitting: Hematology & Oncology

## 2017-12-12 ENCOUNTER — Inpatient Hospital Stay: Payer: 59 | Attending: Hematology & Oncology | Admitting: Hematology & Oncology

## 2017-12-12 ENCOUNTER — Inpatient Hospital Stay: Payer: 59

## 2017-12-12 ENCOUNTER — Other Ambulatory Visit: Payer: Self-pay

## 2017-12-12 ENCOUNTER — Encounter: Payer: Self-pay | Admitting: Hematology & Oncology

## 2017-12-12 ENCOUNTER — Telehealth: Payer: Self-pay

## 2017-12-12 VITALS — BP 134/63 | HR 74 | Temp 98.2°F | Resp 18 | Wt 169.0 lb

## 2017-12-12 DIAGNOSIS — M545 Low back pain: Secondary | ICD-10-CM | POA: Diagnosis not present

## 2017-12-12 DIAGNOSIS — Z5111 Encounter for antineoplastic chemotherapy: Secondary | ICD-10-CM | POA: Diagnosis not present

## 2017-12-12 DIAGNOSIS — Z5112 Encounter for antineoplastic immunotherapy: Secondary | ICD-10-CM | POA: Diagnosis not present

## 2017-12-12 DIAGNOSIS — C9002 Multiple myeloma in relapse: Secondary | ICD-10-CM

## 2017-12-12 DIAGNOSIS — R609 Edema, unspecified: Secondary | ICD-10-CM

## 2017-12-12 DIAGNOSIS — C9000 Multiple myeloma not having achieved remission: Secondary | ICD-10-CM

## 2017-12-12 DIAGNOSIS — M899 Disorder of bone, unspecified: Secondary | ICD-10-CM

## 2017-12-12 DIAGNOSIS — D509 Iron deficiency anemia, unspecified: Secondary | ICD-10-CM | POA: Insufficient documentation

## 2017-12-12 DIAGNOSIS — D500 Iron deficiency anemia secondary to blood loss (chronic): Secondary | ICD-10-CM

## 2017-12-12 LAB — CBC WITH DIFFERENTIAL (CANCER CENTER ONLY)
Basophils Absolute: 0 10*3/uL (ref 0.0–0.1)
Basophils Relative: 1 %
Eosinophils Absolute: 0.1 10*3/uL (ref 0.0–0.5)
Eosinophils Relative: 4 %
HEMATOCRIT: 31.3 % — AB (ref 34.8–46.6)
HEMOGLOBIN: 10.3 g/dL — AB (ref 11.6–15.9)
LYMPHS ABS: 0.5 10*3/uL — AB (ref 0.9–3.3)
Lymphocytes Relative: 29 %
MCH: 30.7 pg (ref 26.0–34.0)
MCHC: 32.9 g/dL (ref 32.0–36.0)
MCV: 93.4 fL (ref 81.0–101.0)
Monocytes Absolute: 0.3 10*3/uL (ref 0.1–0.9)
Monocytes Relative: 19 %
NEUTROS PCT: 47 %
Neutro Abs: 0.8 10*3/uL — ABNORMAL LOW (ref 1.5–6.5)
Platelet Count: 111 10*3/uL — ABNORMAL LOW (ref 145–400)
RBC: 3.35 MIL/uL — AB (ref 3.70–5.32)
RDW: 13.3 % (ref 11.1–15.7)
WBC Count: 1.7 10*3/uL — ABNORMAL LOW (ref 3.9–10.0)

## 2017-12-12 LAB — IRON AND TIBC
IRON: 74 ug/dL (ref 41–142)
Saturation Ratios: 27 % (ref 21–57)
TIBC: 269 ug/dL (ref 236–444)
UIBC: 195 ug/dL

## 2017-12-12 LAB — CMP (CANCER CENTER ONLY)
ALK PHOS: 42 U/L (ref 26–84)
ALT: 22 U/L (ref 10–47)
AST: 32 U/L (ref 11–38)
Albumin: 3.9 g/dL (ref 3.5–5.0)
Anion gap: 4 — ABNORMAL LOW (ref 5–15)
BILIRUBIN TOTAL: 0.7 mg/dL (ref 0.2–1.6)
BUN: 20 mg/dL (ref 7–22)
CO2: 31 mmol/L (ref 18–33)
Calcium: 9.9 mg/dL (ref 8.0–10.3)
Chloride: 106 mmol/L (ref 98–108)
Creatinine: 1 mg/dL (ref 0.60–1.20)
Glucose, Bld: 97 mg/dL (ref 73–118)
POTASSIUM: 4.1 mmol/L (ref 3.3–4.7)
SODIUM: 141 mmol/L (ref 128–145)
Total Protein: 7 g/dL (ref 6.4–8.1)

## 2017-12-12 LAB — FERRITIN: Ferritin: 568 ng/mL — ABNORMAL HIGH (ref 11–307)

## 2017-12-12 LAB — RETICULOCYTES
RBC.: 3.36 MIL/uL — ABNORMAL LOW (ref 3.70–5.45)
RETIC COUNT ABSOLUTE: 50.4 10*3/uL (ref 33.7–90.7)
Retic Ct Pct: 1.5 % (ref 0.7–2.1)

## 2017-12-12 MED ORDER — SODIUM CHLORIDE 0.9% FLUSH
10.0000 mL | INTRAVENOUS | Status: DC | PRN
  Administered 2017-12-12: 10 mL
  Filled 2017-12-12: qty 10

## 2017-12-12 MED ORDER — DEXTROSE 5 % IV SOLN
70.0000 mg | Freq: Once | INTRAVENOUS | Status: AC
  Administered 2017-12-12: 70 mg via INTRAVENOUS
  Filled 2017-12-12: qty 30

## 2017-12-12 MED ORDER — SODIUM CHLORIDE 0.9 % IV SOLN
Freq: Once | INTRAVENOUS | Status: AC
  Administered 2017-12-12: 10:00:00 via INTRAVENOUS
  Filled 2017-12-12: qty 250

## 2017-12-12 MED ORDER — DEXAMETHASONE SODIUM PHOSPHATE 10 MG/ML IJ SOLN
10.0000 mg | Freq: Once | INTRAMUSCULAR | Status: AC
  Administered 2017-12-12: 10 mg via INTRAVENOUS

## 2017-12-12 MED ORDER — SODIUM CHLORIDE 0.9 % IV SOLN
180.0000 mg/m2 | Freq: Once | INTRAVENOUS | Status: AC
  Administered 2017-12-12: 340 mg via INTRAVENOUS
  Filled 2017-12-12: qty 17

## 2017-12-12 MED ORDER — HEPARIN SOD (PORK) LOCK FLUSH 100 UNIT/ML IV SOLN
500.0000 [IU] | Freq: Once | INTRAVENOUS | Status: AC | PRN
  Administered 2017-12-12: 500 [IU]
  Filled 2017-12-12: qty 5

## 2017-12-12 MED ORDER — PALONOSETRON HCL INJECTION 0.25 MG/5ML
0.2500 mg | Freq: Once | INTRAVENOUS | Status: AC
  Administered 2017-12-12: 0.25 mg via INTRAVENOUS

## 2017-12-12 NOTE — Telephone Encounter (Signed)
Okay to treat today despite counts per Dr. Marin Olp.

## 2017-12-12 NOTE — Progress Notes (Signed)
Hematology and Oncology Follow Up Visit  Barbara Cook [id number] [birth year] 62 y.o. 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 Busic is here today with her husband for follow-up and treatment.  She seems to be doing pretty well.  She is tolerated the Kyprolis with Cytoxan fairly nicely.  Her monoclonal studies are holding steady.  Her M spike 2 weeks ago was 0.7 g/dL.  Her IgG level was 1544 mg/dL.  Her kappa light chain was 1.7 mg/dL.Marland Kitchen    She still has some back discomfort.  I suspect that she probably will have some back discomfort.  There is no cough or shortness of breath.  She has had no bleeding.  She has had no change in bowel or bladder habits.  She has chronic mild edema in her legs.  She has had no fever.  Overall, her performance status is ECOG 1. The Medications:  Allergies as of 12/12/2017      Reactions   Codeine Palpitations      Medication List        Accurate as of 12/12/17  9:00 AM. Always use your most recent med list.          acetaminophen 500 MG tablet Commonly known as:  TYLENOL Take 500 mg by mouth every 6 (six) hours as needed for mild pain or headache.   acyclovir 400 MG tablet Commonly known as:  ZOVIRAX Take 1 tablet (400 mg total) by mouth daily.   B-6 250 MG Tabs TAKE 1 TABLET (250 MG TOTAL) BY MOUTH DAILY.   hyoscyamine 0.125 MG SL tablet Commonly known as:  LEVSIN SL PLACE 1 TABLET (0.125 MG TOTAL) UNDER THE TONGUE EVERY 4 (FOUR) HOURS AS NEEDED.   magnesium oxide 400 (241.3 Mg) MG tablet Commonly  known as:  MAG-OX TAKE 1 TABLET BY MOUTH TWICE A DAY   multivitamin tablet Take 1 tablet by mouth every evening.   ondansetron 8 MG tablet Commonly known as:  ZOFRAN TAKE 1 TABLET BY MOUTH TWICE A DAY FOR NAUSEA AND VOMITING 1 TABLET 1 HOUR PRIOR TO CHEMOTHERAPY   pantoprazole 40 MG tablet Commonly known as:  PROTONIX Take 1 tablet (40 mg total) by mouth daily.   polyethylene glycol powder powder Commonly known as:  GLYCOLAX/MIRALAX 1 capful daily as needed   prochlorperazine 10 MG tablet Commonly known as:  COMPAZINE Take 1 tablet (10 mg total) by mouth every 6 (six) hours as needed for nausea or vomiting.   torsemide 20 MG tablet Commonly known as:  DEMADEX Take 1 tablet (20 mg total) by mouth daily as needed.   traMADol 50 MG tablet Commonly known as:  ULTRAM Take 1 tablet (50 mg total) every 6 (six) hours as needed by mouth.   Vitamin D (Ergocalciferol) 50000 units Caps capsule Commonly known as:  DRISDOL TAKE 1 CAPSULE (50,000 UNITS TOTAL) BY MOUTH ONCE A WEEK.       Allergies:  Allergies  Allergen Reactions  . Codeine Palpitations    Past Medical History, Surgical history, Social history, and Family History were reviewed  and updated.  Review of Systems: Review of Systems  Constitutional: Negative.   HENT: Negative.   Eyes: Negative.   Respiratory: Negative.   Cardiovascular: Negative.   Gastrointestinal: Negative.   Genitourinary: Negative.   Musculoskeletal: Positive for back pain.  Skin: Negative.   Neurological: Negative.   Endo/Heme/Allergies: Negative.   Psychiatric/Behavioral: Negative.     Physical Exam:  weight is 169 lb (76.7 kg). Her oral temperature is 98.2 F (36.8 C). Her blood pressure is 134/63 and her pulse is 74. Her respiration is 18 and oxygen saturation is 100%.   Wt Readings from Last 3 Encounters:  12/12/17 169 lb (76.7 kg)  11/21/17 167 lb (75.8 kg)  10/31/17 168 lb 8 oz (76.4 kg)    Physical Exam  Constitutional:  She is oriented to person, place, and time.  HENT:  Head: Normocephalic and atraumatic.  Mouth/Throat: Oropharynx is clear and moist.  Eyes: Pupils are equal, round, and reactive to light. EOM are normal.  Neck: Normal range of motion.  Cardiovascular: Normal rate, regular rhythm and normal heart sounds.  Pulmonary/Chest: Effort normal and breath sounds normal.  Abdominal: Soft. Bowel sounds are normal.  Musculoskeletal: Normal range of motion. She exhibits no edema, tenderness or deformity.  Lymphadenopathy:    She has no cervical adenopathy.  Neurological: She is alert and oriented to person, place, and time.  Skin: Skin is warm and dry. No rash noted. No erythema.  Psychiatric: She has a normal mood and affect. Her behavior is normal. Judgment and thought content normal.  Vitals reviewed.    Lab Results  Component Value Date   WBC 1.7 (L) 12/12/2017   HGB 10.3 (L) 12/12/2017   HCT 31.3 (L) 12/12/2017   MCV 93.4 12/12/2017   PLT 111 (L) 12/12/2017   Lab Results  Component Value Date   FERRITIN 647 (H) 11/21/2017   IRON 83 11/21/2017   TIBC 255 11/21/2017   UIBC 172 11/21/2017   IRONPCTSAT 33 11/21/2017   Lab Results  Component Value Date   RETICCTPCT 1.1 07/18/2017   RBC 3.35 (L) 12/12/2017   RETICCTABS 35.9 12/25/2014   Lab Results  Component Value Date   KPAFRELGTCHN 16.8 11/21/2017   LAMBDASER 7.5 11/21/2017   KAPLAMBRATIO 2.24 (H) 11/21/2017   Lab Results  Component Value Date   IGGSERUM 1,260 11/21/2017   IGGSERUM 1,344 11/21/2017   IGA 25 (L) 11/21/2017   IGA 26 (L) 11/21/2017   IGMSERUM 8 (L) 11/21/2017   IGMSERUM 8 (L) 11/21/2017   Lab Results  Component Value Date   TOTALPROTELP 6.7 11/21/2017   ALBUMINELP 3.7 11/21/2017   A1GS 0.2 11/21/2017   A2GS 0.6 11/21/2017   BETS 0.9 11/21/2017   BETA2SER 0.3 05/07/2015   GAMS 1.2 11/21/2017   MSPIKE 0.7 (H) 11/21/2017   SPEI * 05/07/2015     Chemistry      Component Value Date/Time   NA 140  11/21/2017 0900   NA 143 05/09/2017 0812   NA 139 02/20/2016 1207   K 3.9 11/21/2017 0900   K 4.0 05/09/2017 0812   K 3.8 02/20/2016 1207   CL 105 11/21/2017 0900   CL 108 05/09/2017 0812   CO2 29 11/21/2017 0900   CO2 26 05/09/2017 0812   CO2 29 02/20/2016 1207   BUN 24 (H) 11/21/2017 0900   BUN 13 05/09/2017 0812   BUN 22.3 02/20/2016 1207   CREATININE 1.00 11/21/2017 0900   CREATININE 0.9 05/09/2017 0812   CREATININE 1.3 (H)  02/20/2016 1207      Component Value Date/Time   CALCIUM 9.4 11/21/2017 0900   CALCIUM 8.3 05/09/2017 0812   CALCIUM 9.7 02/20/2016 1207   ALKPHOS 46 11/21/2017 0900   ALKPHOS 123 (H) 05/09/2017 0812   ALKPHOS 62 02/20/2016 1207   AST 26 11/21/2017 0900   AST 27 02/20/2016 1207   ALT 22 11/21/2017 0900   ALT 26 05/09/2017 0812   ALT 19 02/20/2016 1207   BILITOT 0.8 11/21/2017 0900   BILITOT 0.44 02/20/2016 1207      Impression and Plan: Ms. Principato is a very pleasant 62 yo African American female with IgG kappa myeloma. She continues to do well and her M-spike has remained stable.   Again, as long as her M spike is below 1, I think we will be okay with her.  We will plan to get her back in 3 more weeks.  I think this would be reasonable for treatment.     [name], MD 8/5/20199:00 AM

## 2017-12-12 NOTE — Telephone Encounter (Signed)
Appointments scheduled patient not present/ LMVM for patient with Date/Time also per 8/5 los

## 2017-12-12 NOTE — Patient Instructions (Signed)
Implanted Port Home Guide An implanted port is a type of central line that is placed under the skin. Central lines are used to provide IV access when treatment or nutrition needs to be given through a person's veins. Implanted ports are used for long-term IV access. An implanted port may be placed because:  You need IV medicine that would be irritating to the small veins in your hands or arms.  You need long-term IV medicines, such as antibiotics.  You need IV nutrition for a long period.  You need frequent blood draws for lab tests.  You need dialysis.  Implanted ports are usually placed in the chest area, but they can also be placed in the upper arm, the abdomen, or the leg. An implanted port has two main parts:  Reservoir. The reservoir is round and will appear as a small, raised area under your skin. The reservoir is the part where a needle is inserted to give medicines or draw blood.  Catheter. The catheter is a thin, flexible tube that extends from the reservoir. The catheter is placed into a large vein. Medicine that is inserted into the reservoir goes into the catheter and then into the vein.  How will I care for my incision site? Do not get the incision site wet. Bathe or shower as directed by your health care provider. How is my port accessed? Special steps must be taken to access the port:  Before the port is accessed, a numbing cream can be placed on the skin. This helps numb the skin over the port site.  Your health care provider uses a sterile technique to access the port. ? Your health care provider must put on a mask and sterile gloves. ? The skin over your port is cleaned carefully with an antiseptic and allowed to dry. ? The port is gently pinched between sterile gloves, and a needle is inserted into the port.  Only "non-coring" port needles should be used to access the port. Once the port is accessed, a blood return should be checked. This helps ensure that the port  is in the vein and is not clogged.  If your port needs to remain accessed for a constant infusion, a clear (transparent) bandage will be placed over the needle site. The bandage and needle will need to be changed every week, or as directed by your health care provider.  Keep the bandage covering the needle clean and dry. Do not get it wet. Follow your health care provider's instructions on how to take a shower or bath while the port is accessed.  If your port does not need to stay accessed, no bandage is needed over the port.  What is flushing? Flushing helps keep the port from getting clogged. Follow your health care provider's instructions on how and when to flush the port. Ports are usually flushed with saline solution or a medicine called heparin. The need for flushing will depend on how the port is used.  If the port is used for intermittent medicines or blood draws, the port will need to be flushed: ? After medicines have been given. ? After blood has been drawn. ? As part of routine maintenance.  If a constant infusion is running, the port may not need to be flushed.  How long will my port stay implanted? The port can stay in for as long as your health care provider thinks it is needed. When it is time for the port to come out, surgery will be   done to remove it. The procedure is similar to the one performed when the port was put in. When should I seek immediate medical care? When you have an implanted port, you should seek immediate medical care if:  You notice a bad smell coming from the incision site.  You have swelling, redness, or drainage at the incision site.  You have more swelling or pain at the port site or the surrounding area.  You have a fever that is not controlled with medicine.  This information is not intended to replace advice given to you by your health care provider. Make sure you discuss any questions you have with your health care provider. Document  Released: 04/26/2005 Document Revised: 10/02/2015 Document Reviewed: 01/01/2013 Elsevier Interactive Patient Education  2017 [employer]  

## 2017-12-12 NOTE — Patient Instructions (Signed)
[facility] Discharge Instructions for Patients Receiving Chemotherapy  Today you received the following chemotherapy agents:  Kyprolis & Cytoxan  To help prevent nausea and vomiting after your treatment, we encourage you to take your nausea medication as prescribed.    If you develop nausea and vomiting that is not controlled by your nausea medication, call the clinic.   BELOW ARE SYMPTOMS THAT SHOULD BE REPORTED IMMEDIATELY:  *FEVER GREATER THAN 100.5 F  *CHILLS WITH OR WITHOUT FEVER  NAUSEA AND VOMITING THAT IS NOT CONTROLLED WITH YOUR NAUSEA MEDICATION  *UNUSUAL SHORTNESS OF BREATH  *UNUSUAL BRUISING OR BLEEDING  TENDERNESS IN MOUTH AND THROAT WITH OR WITHOUT PRESENCE OF ULCERS  *URINARY PROBLEMS  *BOWEL PROBLEMS  UNUSUAL RASH Items with * indicate a potential emergency and should be followed up as soon as possible.  Feel free to call the clinic should you have any questions or concerns. The clinic phone number is [phone].  Please show the CHEMO ALERT CARD at check-in to the Emergency Department and triage nurse.   

## 2017-12-13 ENCOUNTER — Telehealth: Payer: Self-pay | Admitting: *Deleted

## 2017-12-13 LAB — IGG, IGA, IGM
IgA: 25 mg/dL — ABNORMAL LOW (ref 87–352)
IgG (Immunoglobin G), Serum: 1186 mg/dL (ref 700–1600)
IgM (Immunoglobulin M), Srm: 8 mg/dL — ABNORMAL LOW (ref 26–217)

## 2017-12-13 LAB — KAPPA/LAMBDA LIGHT CHAINS
KAPPA FREE LGHT CHN: 16.1 mg/L (ref 3.3–19.4)
KAPPA, LAMDA LIGHT CHAIN RATIO: 2.3 — AB (ref 0.26–1.65)
Lambda free light chains: 7 mg/L (ref 5.7–26.3)

## 2017-12-13 LAB — PROTEIN ELECTROPHORESIS, SERUM
A/G RATIO SPE: 1.3 (ref 0.7–1.7)
ALBUMIN ELP: 3.6 g/dL (ref 2.9–4.4)
Alpha-1-Globulin: 0.2 g/dL (ref 0.0–0.4)
Alpha-2-Globulin: 0.6 g/dL (ref 0.4–1.0)
Beta Globulin: 0.9 g/dL (ref 0.7–1.3)
GLOBULIN, TOTAL: 2.8 g/dL (ref 2.2–3.9)
Gamma Globulin: 1.2 g/dL (ref 0.4–1.8)
M-Spike, %: 0.6 g/dL — ABNORMAL HIGH
TOTAL PROTEIN ELP: 6.4 g/dL (ref 6.0–8.5)

## 2017-12-13 NOTE — Telephone Encounter (Signed)
Patient due for dose 2 of Kyprolis today. She states that she does not feel well, so she would like to cancel today's appointment. Her temperature is 100.8. She also has a slight headache.   Dr Marin Olp would like her to treat her temperature with tylenol, drink plenty of fluids and rest today. She is to call the office back tomorrow if she is still feeling poorly. She understands these instructions.  Note: appointment for today was to be cancelled, however it appears that it was missed and never scheduled.

## 2017-12-14 ENCOUNTER — Telehealth: Payer: Self-pay | Admitting: *Deleted

## 2017-12-14 NOTE — Telephone Encounter (Addendum)
Patient is aware of results.   ----- Message from Barbara Napoleon, MD sent at 12/14/2017  9:44 AM EDT ----- Call - myeloma is still stable!!!  Great job!!  [name]

## 2017-12-19 ENCOUNTER — Encounter: Payer: Self-pay | Admitting: Hematology & Oncology

## 2017-12-27 ENCOUNTER — Inpatient Hospital Stay: Payer: 59

## 2017-12-27 ENCOUNTER — Other Ambulatory Visit: Payer: Self-pay

## 2017-12-27 VITALS — BP 130/70 | HR 70 | Temp 98.1°F | Resp 18

## 2017-12-27 DIAGNOSIS — M899 Disorder of bone, unspecified: Secondary | ICD-10-CM

## 2017-12-27 DIAGNOSIS — C9000 Multiple myeloma not having achieved remission: Secondary | ICD-10-CM

## 2017-12-27 DIAGNOSIS — Z5112 Encounter for antineoplastic immunotherapy: Secondary | ICD-10-CM | POA: Diagnosis not present

## 2017-12-27 MED ORDER — HEPARIN SOD (PORK) LOCK FLUSH 100 UNIT/ML IV SOLN
500.0000 [IU] | Freq: Once | INTRAVENOUS | Status: AC
  Administered 2017-12-27: 500 [IU] via INTRAVENOUS
  Filled 2017-12-27: qty 5

## 2017-12-27 MED ORDER — ZOLEDRONIC ACID 4 MG/100ML IV SOLN
4.0000 mg | Freq: Once | INTRAVENOUS | Status: AC
  Administered 2017-12-27: 4 mg via INTRAVENOUS
  Filled 2017-12-27: qty 100

## 2017-12-27 MED ORDER — SODIUM CHLORIDE 0.9 % IV SOLN
Freq: Once | INTRAVENOUS | Status: AC
  Administered 2017-12-27: 09:00:00 via INTRAVENOUS
  Filled 2017-12-27: qty 250

## 2017-12-27 MED ORDER — SODIUM CHLORIDE 0.9% FLUSH
10.0000 mL | INTRAVENOUS | Status: DC | PRN
  Administered 2017-12-27: 10 mL via INTRAVENOUS
  Filled 2017-12-27: qty 10

## 2017-12-27 NOTE — Patient Instructions (Signed)
Zoledronic Acid injection (Hypercalcemia, Oncology) (Zometa) What is this medicine? ZOLEDRONIC ACID (ZOE le dron ik AS id) lowers the amount of calcium loss from bone. It is used to treat too much calcium in your blood from cancer. It is also used to prevent complications of cancer that has spread to the bone. This medicine may be used for other purposes; ask your health care provider or pharmacist if you have questions. COMMON BRAND NAME(S): Zometa What should I tell my health care provider before I take this medicine? They need to know if you have any of these conditions: -aspirin-sensitive asthma -cancer, especially if you are receiving medicines used to treat cancer -dental disease or wear dentures -infection -kidney disease -receiving corticosteroids like dexamethasone or prednisone -an unusual or allergic reaction to zoledronic acid, other medicines, foods, dyes, or preservatives -pregnant or trying to get pregnant -breast-feeding How should I use this medicine? This medicine is for infusion into a vein. It is given by a health care professional in a hospital or clinic setting. Talk to your pediatrician regarding the use of this medicine in children. Special care may be needed. Overdosage: If you think you have taken too much of this medicine contact a poison control center or emergency room at once. NOTE: This medicine is only for you. Do not share this medicine with others. What if I miss a dose? It is important not to miss your dose. Call your doctor or health care professional if you are unable to keep an appointment. What may interact with this medicine? -certain antibiotics given by injection -NSAIDs, medicines for pain and inflammation, like ibuprofen or naproxen -some diuretics like bumetanide, furosemide -teriparatide -thalidomide This list may not describe all possible interactions. Give your health care provider a list of all the medicines, herbs, non-prescription drugs,  or dietary supplements you use. Also tell them if you smoke, drink alcohol, or use illegal drugs. Some items may interact with your medicine. What should I watch for while using this medicine? Visit your doctor or health care professional for regular checkups. It may be some time before you see the benefit from this medicine. Do not stop taking your medicine unless your doctor tells you to. Your doctor may order blood tests or other tests to see how you are doing. Women should inform their doctor if they wish to become pregnant or think they might be pregnant. There is a potential for serious side effects to an unborn child. Talk to your health care professional or pharmacist for more information. You should make sure that you get enough calcium and vitamin D while you are taking this medicine. Discuss the foods you eat and the vitamins you take with your health care professional. Some people who take this medicine have severe bone, joint, and/or muscle pain. This medicine may also increase your risk for jaw problems or a broken thigh bone. Tell your doctor right away if you have severe pain in your jaw, bones, joints, or muscles. Tell your doctor if you have any pain that does not go away or that gets worse. Tell your dentist and dental surgeon that you are taking this medicine. You should not have major dental surgery while on this medicine. See your dentist to have a dental exam and fix any dental problems before starting this medicine. Take good care of your teeth while on this medicine. Make sure you see your dentist for regular follow-up appointments. What side effects may I notice from receiving this medicine? Side effects   that you should report to your doctor or health care professional as soon as possible: -allergic reactions like skin rash, itching or hives, swelling of the face, lips, or tongue -anxiety, confusion, or depression -breathing problems -changes in vision -eye pain -feeling faint  or lightheaded, falls -jaw pain, especially after dental work -mouth sores -muscle cramps, stiffness, or weakness -redness, blistering, peeling or loosening of the skin, including inside the mouth -trouble passing urine or change in the amount of urine Side effects that usually do not require medical attention (report to your doctor or health care professional if they continue or are bothersome): -bone, joint, or muscle pain -constipation -diarrhea -fever -hair loss -irritation at site where injected -loss of appetite -nausea, vomiting -stomach upset -trouble sleeping -trouble swallowing -weak or tired This list may not describe all possible side effects. Call your doctor for medical advice about side effects. You may report side effects to FDA at 1-800-FDA-1088. Where should I keep my medicine? This drug is given in a hospital or clinic and will not be stored at home. NOTE: This sheet is a summary. It may not cover all possible information. If you have questions about this medicine, talk to your doctor, pharmacist, or health care provider.  2018 Elsevier/Gold Standard (2013-09-22 14:19:39)  

## 2018-01-02 ENCOUNTER — Other Ambulatory Visit: Payer: 59

## 2018-01-02 ENCOUNTER — Ambulatory Visit: Payer: 59

## 2018-01-02 ENCOUNTER — Ambulatory Visit: Payer: 59 | Admitting: Family

## 2018-01-16 ENCOUNTER — Inpatient Hospital Stay: Payer: 59 | Attending: Hematology & Oncology

## 2018-01-16 ENCOUNTER — Other Ambulatory Visit: Payer: Self-pay

## 2018-01-16 ENCOUNTER — Encounter: Payer: Self-pay | Admitting: Family

## 2018-01-16 ENCOUNTER — Inpatient Hospital Stay (HOSPITAL_BASED_OUTPATIENT_CLINIC_OR_DEPARTMENT_OTHER): Payer: 59 | Admitting: Family

## 2018-01-16 ENCOUNTER — Inpatient Hospital Stay: Payer: 59

## 2018-01-16 VITALS — BP 133/68 | HR 82 | Temp 97.6°F | Resp 20 | Wt 170.0 lb

## 2018-01-16 DIAGNOSIS — Z452 Encounter for adjustment and management of vascular access device: Secondary | ICD-10-CM | POA: Insufficient documentation

## 2018-01-16 DIAGNOSIS — C9000 Multiple myeloma not having achieved remission: Secondary | ICD-10-CM

## 2018-01-16 DIAGNOSIS — G629 Polyneuropathy, unspecified: Secondary | ICD-10-CM

## 2018-01-16 DIAGNOSIS — D509 Iron deficiency anemia, unspecified: Secondary | ICD-10-CM | POA: Insufficient documentation

## 2018-01-16 DIAGNOSIS — R109 Unspecified abdominal pain: Secondary | ICD-10-CM

## 2018-01-16 DIAGNOSIS — Z5112 Encounter for antineoplastic immunotherapy: Secondary | ICD-10-CM | POA: Diagnosis not present

## 2018-01-16 DIAGNOSIS — C9002 Multiple myeloma in relapse: Secondary | ICD-10-CM

## 2018-01-16 DIAGNOSIS — R1012 Left upper quadrant pain: Secondary | ICD-10-CM | POA: Insufficient documentation

## 2018-01-16 DIAGNOSIS — Z79899 Other long term (current) drug therapy: Secondary | ICD-10-CM | POA: Diagnosis not present

## 2018-01-16 DIAGNOSIS — Z5111 Encounter for antineoplastic chemotherapy: Secondary | ICD-10-CM | POA: Diagnosis not present

## 2018-01-16 DIAGNOSIS — D500 Iron deficiency anemia secondary to blood loss (chronic): Secondary | ICD-10-CM

## 2018-01-16 DIAGNOSIS — R0781 Pleurodynia: Secondary | ICD-10-CM | POA: Insufficient documentation

## 2018-01-16 LAB — CBC WITH DIFFERENTIAL (CANCER CENTER ONLY)
BASOS ABS: 0 10*3/uL (ref 0.0–0.1)
BASOS PCT: 0 %
Eosinophils Absolute: 0.1 10*3/uL (ref 0.0–0.5)
Eosinophils Relative: 5 %
HEMATOCRIT: 31 % — AB (ref 34.8–46.6)
HEMOGLOBIN: 10.2 g/dL — AB (ref 11.6–15.9)
Lymphocytes Relative: 28 %
Lymphs Abs: 0.7 10*3/uL — ABNORMAL LOW (ref 0.9–3.3)
MCH: 30.6 pg (ref 26.0–34.0)
MCHC: 32.9 g/dL (ref 32.0–36.0)
MCV: 93.1 fL (ref 81.0–101.0)
Monocytes Absolute: 0.4 10*3/uL (ref 0.1–0.9)
Monocytes Relative: 14 %
NEUTROS ABS: 1.4 10*3/uL — AB (ref 1.5–6.5)
NEUTROS PCT: 53 %
Platelet Count: 120 10*3/uL — ABNORMAL LOW (ref 145–400)
RBC: 3.33 MIL/uL — ABNORMAL LOW (ref 3.70–5.32)
RDW: 13.1 % (ref 11.1–15.7)
WBC Count: 2.6 10*3/uL — ABNORMAL LOW (ref 3.9–10.0)

## 2018-01-16 LAB — CMP (CANCER CENTER ONLY)
ALBUMIN: 3.8 g/dL (ref 3.5–5.0)
ALT: 22 U/L (ref 10–47)
AST: 26 U/L (ref 11–38)
Alkaline Phosphatase: 44 U/L (ref 26–84)
Anion gap: 5 (ref 5–15)
BILIRUBIN TOTAL: 0.6 mg/dL (ref 0.2–1.6)
BUN: 23 mg/dL — AB (ref 7–22)
CALCIUM: 9.8 mg/dL (ref 8.0–10.3)
CHLORIDE: 110 mmol/L — AB (ref 98–108)
CO2: 27 mmol/L (ref 18–33)
CREATININE: 1.2 mg/dL (ref 0.60–1.20)
Glucose, Bld: 100 mg/dL (ref 73–118)
Potassium: 4.2 mmol/L (ref 3.3–4.7)
Sodium: 142 mmol/L (ref 128–145)
Total Protein: 7 g/dL (ref 6.4–8.1)

## 2018-01-16 LAB — LACTATE DEHYDROGENASE: LDH: 213 U/L — ABNORMAL HIGH (ref 98–192)

## 2018-01-16 MED ORDER — DEXTROSE 5 % IV SOLN
70.0000 mg | Freq: Once | INTRAVENOUS | Status: AC
  Administered 2018-01-16: 70 mg via INTRAVENOUS
  Filled 2018-01-16: qty 30

## 2018-01-16 MED ORDER — DEXAMETHASONE SODIUM PHOSPHATE 10 MG/ML IJ SOLN
10.0000 mg | Freq: Once | INTRAMUSCULAR | Status: AC
  Administered 2018-01-16: 10 mg via INTRAVENOUS

## 2018-01-16 MED ORDER — SODIUM CHLORIDE 0.9 % IV SOLN
Freq: Once | INTRAVENOUS | Status: AC
  Administered 2018-01-16: 13:00:00 via INTRAVENOUS
  Filled 2018-01-16: qty 250

## 2018-01-16 MED ORDER — PALONOSETRON HCL INJECTION 0.25 MG/5ML
INTRAVENOUS | Status: AC
  Filled 2018-01-16: qty 5

## 2018-01-16 MED ORDER — HEPARIN SOD (PORK) LOCK FLUSH 100 UNIT/ML IV SOLN
500.0000 [IU] | Freq: Once | INTRAVENOUS | Status: AC | PRN
  Administered 2018-01-16: 500 [IU]
  Filled 2018-01-16: qty 5

## 2018-01-16 MED ORDER — SODIUM CHLORIDE 0.9% FLUSH
10.0000 mL | INTRAVENOUS | Status: DC | PRN
  Administered 2018-01-16: 10 mL
  Filled 2018-01-16: qty 10

## 2018-01-16 MED ORDER — DEXAMETHASONE SODIUM PHOSPHATE 10 MG/ML IJ SOLN
INTRAMUSCULAR | Status: AC
  Filled 2018-01-16: qty 1

## 2018-01-16 MED ORDER — SODIUM CHLORIDE 0.9 % IV SOLN
180.0000 mg/m2 | Freq: Once | INTRAVENOUS | Status: AC
  Administered 2018-01-16: 340 mg via INTRAVENOUS
  Filled 2018-01-16: qty 17

## 2018-01-16 MED ORDER — PALONOSETRON HCL INJECTION 0.25 MG/5ML
0.2500 mg | Freq: Once | INTRAVENOUS | Status: AC
  Administered 2018-01-16: 0.25 mg via INTRAVENOUS

## 2018-01-16 NOTE — Progress Notes (Signed)
Hematology and Oncology Follow Up Visit  Barbara Cook [id number] 1956-03-30 62 y.o. 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 She is doing fairly well but states that she is having pain in the left upper quadrant and ribs off and on for few months. No organomegaly or abnormality noted on today's assessment.  Abdominal US in June was negative.  August M-spike was 0.6, IgG level 1,186 mg/dL and kappa light chains 1.61 mg/dL.  She has had no fever, chills, n/v, cough, rash, dizziness, SOB, chest pain, palpitations or changes in bowel or bladder habits.  She has been having hot flashes and night sweats off and on.  The puffiness in her feet and ankles is stable. She takes Demedex daily as needed.  The neuropathy in her feet is unchanged.  No tenderness in her extremities.  She has maintained a good appetite and is staying well hydrated. Her weight is stable.   ECOG Performance Status: 1 - Symptomatic but completely ambulatory  Medications:  Allergies as of 01/16/2018      Reactions   Codeine Palpitations      Medication List        Accurate as of 01/16/18 12:23 PM. Always use your most recent med list.          acetaminophen 500 MG tablet Commonly known as:  TYLENOL Take 500 mg by mouth every 6 (six) hours as needed for mild pain or headache.   acyclovir 400 MG tablet Commonly known as:  ZOVIRAX Take 1 tablet (400 mg total) by mouth daily.   B-6 250 MG Tabs TAKE 1 TABLET (250 MG TOTAL)  BY MOUTH DAILY.   hyoscyamine 0.125 MG SL tablet Commonly known as:  LEVSIN SL PLACE 1 TABLET (0.125 MG TOTAL) UNDER THE TONGUE EVERY 4 (FOUR) HOURS AS NEEDED.   magnesium oxide 400 (241.3 Mg) MG tablet Commonly known as:  MAG-OX TAKE 1 TABLET BY MOUTH TWICE A DAY   multivitamin tablet Take 1 tablet by mouth every evening.   ondansetron 8 MG tablet Commonly known as:  ZOFRAN TAKE 1 TABLET BY MOUTH TWICE A DAY FOR NAUSEA AND VOMITING 1 TABLET 1 HOUR PRIOR TO CHEMOTHERAPY   pantoprazole 40 MG tablet Commonly known as:  PROTONIX Take 1 tablet (40 mg total) by mouth daily.   polyethylene glycol powder powder Commonly known as:  GLYCOLAX/MIRALAX 1 capful daily as needed   prochlorperazine 10 MG tablet Commonly known as:  COMPAZINE Take 1 tablet (10 mg total) by mouth every 6 (six) hours as needed for nausea or vomiting.   torsemide 20 MG tablet Commonly known as:  DEMADEX Take 1 tablet (20 mg total) by mouth daily as needed.   traMADol 50 MG tablet Commonly known as:  ULTRAM Take 1 tablet (50 mg total) every 6 (six) hours as needed by mouth.   Vitamin D (Ergocalciferol) 50000 units Caps capsule Commonly known as:  DRISDOL TAKE 1 CAPSULE (50,000 UNITS TOTAL) BY MOUTH ONCE A WEEK.  Allergies:  Allergies  Allergen Reactions  . Codeine Palpitations    Past Medical History, Surgical history, Social history, and Family History were reviewed and updated.  Review of Systems: All other 10 point review of systems is negative.   Physical Exam:  weight is 170 lb (77.1 kg). Her oral temperature is 97.6 F (36.4 C). Her blood pressure is 133/68 and her pulse is 82. Her respiration is 20 and oxygen saturation is 100%.   Wt Readings from Last 3 Encounters:  01/16/18 170 lb (77.1 kg)  12/12/17 169 lb (76.7 kg)  11/21/17 167 lb (75.8 kg)    Ocular: Sclerae unicteric, pupils equal, round and reactive to light Ear-nose-throat: Oropharynx clear, dentition fair Lymphatic:  No cervical, supraclavicular or axillary adenopathy Lungs no rales or rhonchi, good excursion bilaterally Heart regular rate and rhythm, no murmur appreciated Abd soft, nontender, positive bowel sounds, no liver or spleen tip palpated on exam, no fluid wave  MSK no focal spinal tenderness, no joint edema Neuro: non-focal, well-oriented, appropriate affect Breasts: Deferred   Lab Results  Component Value Date   WBC 2.6 (L) 01/16/2018   HGB 10.2 (L) 01/16/2018   HCT 31.0 (L) 01/16/2018   MCV 93.1 01/16/2018   PLT 120 (L) 01/16/2018   Lab Results  Component Value Date   FERRITIN 568 (H) 12/12/2017   IRON 74 12/12/2017   TIBC 269 12/12/2017   UIBC 195 12/12/2017   IRONPCTSAT 27 12/12/2017   Lab Results  Component Value Date   RETICCTPCT 1.5 12/12/2017   RBC 3.33 (L) 01/16/2018   RETICCTABS 35.9 12/25/2014   Lab Results  Component Value Date   KPAFRELGTCHN 16.1 12/12/2017   LAMBDASER 7.0 12/12/2017   KAPLAMBRATIO 2.30 (H) 12/12/2017   Lab Results  Component Value Date   IGGSERUM 1,186 12/12/2017   IGA 25 (L) 12/12/2017   IGMSERUM 8 (L) 12/12/2017   Lab Results  Component Value Date   TOTALPROTELP 6.4 12/12/2017   ALBUMINELP 3.6 12/12/2017   A1GS 0.2 12/12/2017   A2GS 0.6 12/12/2017   BETS 0.9 12/12/2017   BETA2SER 0.3 05/07/2015   GAMS 1.2 12/12/2017   MSPIKE 0.6 (H) 12/12/2017   SPEI Comment 12/12/2017     Chemistry      Component Value Date/Time   NA 142 01/16/2018 1124   NA 143 05/09/2017 0812   NA 139 02/20/2016 1207   K 4.2 01/16/2018 1124   K 4.0 05/09/2017 0812   K 3.8 02/20/2016 1207   CL 110 (H) 01/16/2018 1124   CL 108 05/09/2017 0812   CO2 27 01/16/2018 1124   CO2 26 05/09/2017 0812   CO2 29 02/20/2016 1207   BUN 23 (H) 01/16/2018 1124   BUN 13 05/09/2017 0812   BUN 22.3 02/20/2016 1207   CREATININE 1.20 01/16/2018 1124   CREATININE 0.9 05/09/2017 0812   CREATININE 1.3 (H) 02/20/2016 1207      Component Value Date/Time   CALCIUM 9.8  01/16/2018 1124   CALCIUM 8.3 05/09/2017 0812   CALCIUM 9.7 02/20/2016 1207   ALKPHOS 44 01/16/2018 1124   ALKPHOS 123 (H) 05/09/2017 0812   ALKPHOS 62 02/20/2016 1207   AST 26 01/16/2018 1124   AST 27 02/20/2016 1207   ALT 22 01/16/2018 1124   ALT 26 05/09/2017 0812   ALT 19 02/20/2016 1207   BILITOT 0.6 01/16/2018 1124   BILITOT 0.44 02/20/2016 1207      Impression and Plan: Barbara Cook is a very pleasant 62 yo African  American female with IgG kappa myeloma. Her M-spike has remained stable and she is tolerating treatment well. Today's myeloma studies are pending.  We will proceed with treatment today as planned.  We will get an abdominal CT scan to evaluate for cause of left upper quadrant and rib pain.  We will plan to see her back in another 3 weeks for follow-up.  She will contact our office with any questions or concerns. We can certainly see her sooner if need be.   Laverna Peace, NP 9/9/201912:23 PM

## 2018-01-16 NOTE — Progress Notes (Signed)
OK to treat with ANC-0.4 per Dr. Ennever.  

## 2018-01-17 ENCOUNTER — Inpatient Hospital Stay: Payer: 59

## 2018-01-17 ENCOUNTER — Ambulatory Visit (HOSPITAL_BASED_OUTPATIENT_CLINIC_OR_DEPARTMENT_OTHER): Payer: 59

## 2018-01-17 DIAGNOSIS — C9002 Multiple myeloma in relapse: Secondary | ICD-10-CM

## 2018-01-17 DIAGNOSIS — Z5112 Encounter for antineoplastic immunotherapy: Secondary | ICD-10-CM | POA: Diagnosis not present

## 2018-01-17 DIAGNOSIS — C9000 Multiple myeloma not having achieved remission: Secondary | ICD-10-CM

## 2018-01-17 LAB — IGG, IGA, IGM
IGA: 33 mg/dL — AB (ref 87–352)
IGG (IMMUNOGLOBIN G), SERUM: 1437 mg/dL (ref 700–1600)
IGM (IMMUNOGLOBULIN M), SRM: 8 mg/dL — AB (ref 26–217)

## 2018-01-17 MED ORDER — SODIUM CHLORIDE 0.9% FLUSH
10.0000 mL | INTRAVENOUS | Status: DC | PRN
  Administered 2018-01-17: 10 mL
  Filled 2018-01-17: qty 10

## 2018-01-17 MED ORDER — DEXAMETHASONE SODIUM PHOSPHATE 10 MG/ML IJ SOLN
10.0000 mg | Freq: Once | INTRAMUSCULAR | Status: AC
  Administered 2018-01-17: 10 mg via INTRAVENOUS

## 2018-01-17 MED ORDER — DEXTROSE 5 % IV SOLN
70.0000 mg | Freq: Once | INTRAVENOUS | Status: AC
  Administered 2018-01-17: 70 mg via INTRAVENOUS
  Filled 2018-01-17: qty 30

## 2018-01-17 MED ORDER — DEXAMETHASONE SODIUM PHOSPHATE 10 MG/ML IJ SOLN
INTRAMUSCULAR | Status: AC
  Filled 2018-01-17: qty 1

## 2018-01-17 MED ORDER — SODIUM CHLORIDE 0.9 % IV SOLN
Freq: Once | INTRAVENOUS | Status: AC
  Administered 2018-01-17: 08:00:00 via INTRAVENOUS
  Filled 2018-01-17: qty 250

## 2018-01-17 MED ORDER — HEPARIN SOD (PORK) LOCK FLUSH 100 UNIT/ML IV SOLN
500.0000 [IU] | Freq: Once | INTRAVENOUS | Status: AC | PRN
  Administered 2018-01-17: 500 [IU]
  Filled 2018-01-17: qty 5

## 2018-01-17 NOTE — Patient Instructions (Signed)
Carfilzomib injection What is this medicine? CARFILZOMIB (kar FILZ oh mib) targets a specific protein within cancer cells and stops the cancer cells from growing. It is used to treat multiple myeloma. This medicine may be used for other purposes; ask your health care provider or pharmacist if you have questions. COMMON BRAND NAME(S): KYPROLIS What should I tell my health care provider before I take this medicine? They need to know if you have any of these conditions: -heart disease -history of blood clots -irregular heartbeat -kidney disease -liver disease -lung or breathing disease -an unusual or allergic reaction to carfilzomib, or other medicines, foods, dyes, or preservatives -pregnant or trying to get pregnant -breast-feeding How should I use this medicine? This medicine is for injection or infusion into a vein. It is given by a health care professional in a hospital or clinic setting. Talk to your pediatrician regarding the use of this medicine in children. Special care may be needed. Overdosage: If you think you have taken too much of this medicine contact a poison control center or emergency room at once. NOTE: This medicine is only for you. Do not share this medicine with others. What if I miss a dose? It is important not to miss your dose. Call your doctor or health care professional if you are unable to keep an appointment. What may interact with this medicine? Interactions are not expected. Give your health care provider a list of all the medicines, herbs, non-prescription drugs, or dietary supplements you use. Also tell them if you smoke, drink alcohol, or use illegal drugs. Some items may interact with your medicine. This list may not describe all possible interactions. Give your health care provider a list of all the medicines, herbs, non-prescription drugs, or dietary supplements you use. Also tell them if you smoke, drink alcohol, or use illegal drugs. Some items may  interact with your medicine. What should I watch for while using this medicine? Your condition will be monitored carefully while you are receiving this medicine. Report any side effects. Continue your course of treatment even though you feel ill unless your doctor tells you to stop. You may need blood work done while you are taking this medicine. Do not become pregnant while taking this medicine or for at least 30 days after stopping it. Women should inform their doctor if they wish to become pregnant or think they might be pregnant. There is a potential for serious side effects to an unborn child. Men should not father a child while taking this medicine and for 90 days after stopping it. Talk to your health care professional or pharmacist for more information. Do not breast-feed an infant while taking this medicine. Check with your doctor or health care professional if you get an attack of severe diarrhea, nausea and vomiting, or if you sweat a lot. The loss of too much body fluid can make it dangerous for you to take this medicine. You may get dizzy. Do not drive, use machinery, or do anything that needs mental alertness until you know how this medicine affects you. Do not stand or sit up quickly, especially if you are an older patient. This reduces the risk of dizzy or fainting spells. What side effects may I notice from receiving this medicine? Side effects that you should report to your doctor or health care professional as soon as possible: -allergic reactions like skin rash, itching or hives, swelling of the face, lips, or tongue -confusion -dizziness -feeling faint or lightheaded -fever or chills -  palpitations -seizures -signs and symptoms of bleeding such as bloody or black, tarry stools; red or dark-brown urine; spitting up blood or brown material that looks like coffee grounds; red spots on the skin; unusual bruising or bleeding including from the eye, gums, or nose -signs and symptoms of  a blood clot such as breathing problems; changes in vision; chest pain; severe, sudden headache; pain, swelling, warmth in the leg; trouble speaking; sudden numbness or weakness of the face, arm or leg -signs and symptoms of kidney injury like trouble passing urine or change in the amount of urine -signs and symptoms of liver injury like dark yellow or brown urine; general ill feeling or flu-like symptoms; light-colored stools; loss of appetite; nausea; right upper belly pain; unusually weak or tired; yellowing of the eyes or skin Side effects that usually do not require medical attention (report to your doctor or health care professional if they continue or are bothersome): -back pain -cough -diarrhea -headache -muscle cramps -vomiting This list may not describe all possible side effects. Call your doctor for medical advice about side effects. You may report side effects to FDA at 1-800-FDA-1088. Where should I keep my medicine? This drug is given in a hospital or clinic and will not be stored at home. NOTE: This sheet is a summary. It may not cover all possible information. If you have questions about this medicine, talk to your doctor, pharmacist, or health care provider.  2018 Elsevier/Gold Standard (2015-05-29 13:39:23)  

## 2018-01-18 LAB — PROTEIN ELECTROPHORESIS, SERUM, WITH REFLEX
A/G Ratio: 1.4 (ref 0.7–1.7)
ALBUMIN ELP: 3.8 g/dL (ref 2.9–4.4)
ALPHA-1-GLOBULIN: 0.2 g/dL (ref 0.0–0.4)
Alpha-2-Globulin: 0.7 g/dL (ref 0.4–1.0)
BETA GLOBULIN: 0.7 g/dL (ref 0.7–1.3)
GAMMA GLOBULIN: 1.2 g/dL (ref 0.4–1.8)
Globulin, Total: 2.8 g/dL (ref 2.2–3.9)
M-Spike, %: 0.6 g/dL — ABNORMAL HIGH
SPEP Interpretation: 0
TOTAL PROTEIN ELP: 6.6 g/dL (ref 6.0–8.5)

## 2018-01-18 LAB — KAPPA/LAMBDA LIGHT CHAINS
Kappa free light chain: 22.2 mg/L — ABNORMAL HIGH (ref 3.3–19.4)
Kappa, lambda light chain ratio: 2.61 — ABNORMAL HIGH (ref 0.26–1.65)
Lambda free light chains: 8.5 mg/L (ref 5.7–26.3)

## 2018-01-18 LAB — IMMUNOFIXATION REFLEX, SERUM
IGM (IMMUNOGLOBULIN M), SRM: 9 mg/dL — AB (ref 26–217)
IgA: 34 mg/dL — ABNORMAL LOW (ref 87–352)
IgG (Immunoglobin G), Serum: 1402 mg/dL (ref 700–1600)

## 2018-01-19 ENCOUNTER — Encounter: Payer: Self-pay | Admitting: Hematology & Oncology

## 2018-01-20 ENCOUNTER — Ambulatory Visit (HOSPITAL_BASED_OUTPATIENT_CLINIC_OR_DEPARTMENT_OTHER)
Admission: RE | Admit: 2018-01-20 | Discharge: 2018-01-20 | Disposition: A | Discharge status: Home / Self Care | Payer: 59 | Source: Ambulatory Visit | Attending: Family | Admitting: Family

## 2018-01-20 ENCOUNTER — Other Ambulatory Visit: Payer: Self-pay | Admitting: Family

## 2018-01-20 ENCOUNTER — Encounter (HOSPITAL_BASED_OUTPATIENT_CLINIC_OR_DEPARTMENT_OTHER): Payer: Self-pay

## 2018-01-20 ENCOUNTER — Inpatient Hospital Stay: Payer: 59

## 2018-01-20 ENCOUNTER — Telehealth: Payer: Self-pay | Admitting: Family

## 2018-01-20 ENCOUNTER — Encounter: Payer: Self-pay | Admitting: Radiation Oncology

## 2018-01-20 DIAGNOSIS — C9000 Multiple myeloma not having achieved remission: Secondary | ICD-10-CM

## 2018-01-20 DIAGNOSIS — R1011 Right upper quadrant pain: Secondary | ICD-10-CM | POA: Insufficient documentation

## 2018-01-20 DIAGNOSIS — R0781 Pleurodynia: Secondary | ICD-10-CM | POA: Insufficient documentation

## 2018-01-20 DIAGNOSIS — Z5112 Encounter for antineoplastic immunotherapy: Secondary | ICD-10-CM | POA: Diagnosis not present

## 2018-01-20 DIAGNOSIS — R109 Unspecified abdominal pain: Secondary | ICD-10-CM

## 2018-01-20 DIAGNOSIS — M4854XA Collapsed vertebra, not elsewhere classified, thoracic region, initial encounter for fracture: Secondary | ICD-10-CM | POA: Insufficient documentation

## 2018-01-20 DIAGNOSIS — C9001 Multiple myeloma in remission: Secondary | ICD-10-CM | POA: Diagnosis not present

## 2018-01-20 DIAGNOSIS — M799 Soft tissue disorder, unspecified: Secondary | ICD-10-CM

## 2018-01-20 DIAGNOSIS — M899 Disorder of bone, unspecified: Secondary | ICD-10-CM

## 2018-01-20 MED ORDER — SODIUM CHLORIDE 0.9% FLUSH
10.0000 mL | INTRAVENOUS | Status: DC | PRN
  Administered 2018-01-20: 10 mL via INTRAVENOUS
  Filled 2018-01-20: qty 10

## 2018-01-20 MED ORDER — IOPAMIDOL (ISOVUE-300) INJECTION 61%
100.0000 mL | Freq: Once | INTRAVENOUS | Status: AC | PRN
  Administered 2018-01-20: 100 mL via INTRAVENOUS

## 2018-01-20 MED ORDER — HEPARIN SOD (PORK) LOCK FLUSH 100 UNIT/ML IV SOLN
500.0000 [IU] | Freq: Once | INTRAVENOUS | Status: AC
  Administered 2018-01-20: 500 [IU] via INTRAVENOUS
  Filled 2018-01-20: qty 5

## 2018-01-20 NOTE — Telephone Encounter (Signed)
I spoke with Ms. Barbara Cook about her CT scan results from today. This showed a 2.9 cm soft tissue lesion involving the anterior aspect of the left 7th rib. Referral placed with radiation oncology and Dr. Marin Olp plans to call and speak with Dr. Earney Hamburg as well. She verbalized understanding and agreement with the plan.

## 2018-01-20 NOTE — Patient Instructions (Signed)
Implanted Port Home Guide An implanted port is a type of central line that is placed under the skin. Central lines are used to provide IV access when treatment or nutrition needs to be given through a person's veins. Implanted ports are used for long-term IV access. An implanted port may be placed because:  You need IV medicine that would be irritating to the small veins in your hands or arms.  You need long-term IV medicines, such as antibiotics.  You need IV nutrition for a long period.  You need frequent blood draws for lab tests.  You need dialysis.  Implanted ports are usually placed in the chest area, but they can also be placed in the upper arm, the abdomen, or the leg. An implanted port has two main parts:  Reservoir. The reservoir is round and will appear as a small, raised area under your skin. The reservoir is the part where a needle is inserted to give medicines or draw blood.  Catheter. The catheter is a thin, flexible tube that extends from the reservoir. The catheter is placed into a large vein. Medicine that is inserted into the reservoir goes into the catheter and then into the vein.  How will I care for my incision site? Do not get the incision site wet. Bathe or shower as directed by your health care provider. How is my port accessed? Special steps must be taken to access the port:  Before the port is accessed, a numbing cream can be placed on the skin. This helps numb the skin over the port site.  Your health care provider uses a sterile technique to access the port. ? Your health care provider must put on a mask and sterile gloves. ? The skin over your port is cleaned carefully with an antiseptic and allowed to dry. ? The port is gently pinched between sterile gloves, and a needle is inserted into the port.  Only "non-coring" port needles should be used to access the port. Once the port is accessed, a blood return should be checked. This helps ensure that the port  is in the vein and is not clogged.  If your port needs to remain accessed for a constant infusion, a clear (transparent) bandage will be placed over the needle site. The bandage and needle will need to be changed every week, or as directed by your health care provider.  Keep the bandage covering the needle clean and dry. Do not get it wet. Follow your health care provider's instructions on how to take a shower or bath while the port is accessed.  If your port does not need to stay accessed, no bandage is needed over the port.  What is flushing? Flushing helps keep the port from getting clogged. Follow your health care provider's instructions on how and when to flush the port. Ports are usually flushed with saline solution or a medicine called heparin. The need for flushing will depend on how the port is used.  If the port is used for intermittent medicines or blood draws, the port will need to be flushed: ? After medicines have been given. ? After blood has been drawn. ? As part of routine maintenance.  If a constant infusion is running, the port may not need to be flushed.  How long will my port stay implanted? The port can stay in for as long as your health care provider thinks it is needed. When it is time for the port to come out, surgery will be   done to remove it. The procedure is similar to the one performed when the port was put in. When should I seek immediate medical care? When you have an implanted port, you should seek immediate medical care if:  You notice a bad smell coming from the incision site.  You have swelling, redness, or drainage at the incision site.  You have more swelling or pain at the port site or the surrounding area.  You have a fever that is not controlled with medicine.  This information is not intended to replace advice given to you by your health care provider. Make sure you discuss any questions you have with your health care provider. Document  Released: 04/26/2005 Document Revised: 10/02/2015 Document Reviewed: 01/01/2013 Elsevier Interactive Patient Education  2017 [employer]  

## 2018-01-23 ENCOUNTER — Encounter: Payer: Self-pay | Admitting: Radiation Oncology

## 2018-01-24 ENCOUNTER — Encounter: Payer: Self-pay | Admitting: Family

## 2018-01-24 DIAGNOSIS — H9202 Otalgia, left ear: Secondary | ICD-10-CM | POA: Insufficient documentation

## 2018-01-24 DIAGNOSIS — H6122 Impacted cerumen, left ear: Secondary | ICD-10-CM | POA: Insufficient documentation

## 2018-01-24 DIAGNOSIS — J302 Other seasonal allergic rhinitis: Secondary | ICD-10-CM | POA: Diagnosis not present

## 2018-01-24 DIAGNOSIS — H9192 Unspecified hearing loss, left ear: Secondary | ICD-10-CM | POA: Insufficient documentation

## 2018-01-27 ENCOUNTER — Other Ambulatory Visit: Payer: Self-pay | Admitting: Family

## 2018-01-27 NOTE — Progress Notes (Signed)
Histology and Location of Primary Cancer: IgG kappa myeloma  Location(s) of Symptomatic Metastases: Per CT Abdomen 01/20/18:  IMPRESSION: 1. 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 New from 03/09/2016. This likely accounts for the patient's left upper quadrant and rib pain. 2. Diffuse myeloma is changes within the axial skeleton. Chronic compression fractures involving T12 and T10 are again noted. 3. No acute findings identified within the abdomen.  Past/Anticipated chemotherapy by medical oncology, if any: Dr. Marlane Hatcher Oncologist:  Past Therapy: Daratumumab q weekly therapy -s/p cycle #3 - d/c'ed due to progression of myeloma  Current Therapy:        Kyprolis/Cytoxan/Decadron - s/p cycle 5 - started on 04/11/2017 Zometa every 3 months- next dose 02/2018 Aranesp 300 mcg sq q month for Hgb < 10 IV Iron with Feraheme - dose given on 08/15/2017  Pain on a scale of 0-10 is: Pt reports pain as a 1/10 in LEFT rib.    Ambulatory status? Walker? Wheelchair?: Ambulatory  SAFETY ISSUES:  Prior radiation? 2016; back. 10 fractions/25 gray. T9-L1.  Pacemaker/ICD? No  Possible current pregnancy? N/A, pt is post-surgical  Is the patient on methotrexate? No  Current Complaints / other details:  Pt presents today for consult with Dr. Sondra Come for Radiation Oncology. Pt is accompanied by husband. Pt has had Radiation Therapy previously by Dr. Sondra Come to T9-L1 in 2016.  Loma Sousa, RN BSN

## 2018-02-01 ENCOUNTER — Ambulatory Visit
Admission: RE | Admit: 2018-02-01 | Discharge: 2018-02-01 | Disposition: A | Discharge status: Home / Self Care | Payer: 59 | Source: Ambulatory Visit | Attending: Radiation Oncology | Admitting: Radiation Oncology

## 2018-02-01 ENCOUNTER — Encounter: Payer: Self-pay | Admitting: Radiation Oncology

## 2018-02-01 ENCOUNTER — Other Ambulatory Visit: Payer: Self-pay

## 2018-02-01 VITALS — BP 150/76 | HR 70 | Temp 98.2°F | Resp 16 | Ht 62.0 in | Wt 173.4 lb

## 2018-02-01 DIAGNOSIS — Z79899 Other long term (current) drug therapy: Secondary | ICD-10-CM | POA: Insufficient documentation

## 2018-02-01 DIAGNOSIS — Z923 Personal history of irradiation: Secondary | ICD-10-CM | POA: Diagnosis not present

## 2018-02-01 DIAGNOSIS — G893 Neoplasm related pain (acute) (chronic): Secondary | ICD-10-CM | POA: Diagnosis not present

## 2018-02-01 DIAGNOSIS — C9000 Multiple myeloma not having achieved remission: Secondary | ICD-10-CM | POA: Diagnosis not present

## 2018-02-01 DIAGNOSIS — Z885 Allergy status to narcotic agent status: Secondary | ICD-10-CM | POA: Diagnosis not present

## 2018-02-01 NOTE — Progress Notes (Signed)
Radiation Oncology         [phone] ________________________________  Name: Barbara Cook MRN: [id number]  Date: 02/01/2018  DOB: 11-Aug-1955  Re-Evaluation Visit Note  CC: [name], MD  [name], MD    ICD-10-CM   1. Multiple myeloma not having achieved remission [facility]) C90.00     Diagnosis:  Multiple Myeloma   Interval Since Last Radiation:  3 years and 8 months   Radiation treatment dates:   05/01/2015-05/16/2014  Site/dose:   T9-L1 spine, 25 gray in 10 fractions  Narrative:  The patient returns today for re-evaluation. They are accompanied by her husband today. She has maintained close follow up with her Medical Oncologist, Dr. Martha Clan. She has associated left rib pain, left chest wall pain, and upper abdominal pain. A  Subsequent CT scan was ordered to address this issue. She notes that with her last radiation treatment, she has a sensation of a "knot" to her throat that cleared up following treatment. She is not taking any medications for her symptoms. She denies pleuritic CP, issues with raising her arms up, and any other symptoms. She has a follow up appointment with her Medical Oncologist, Dr. Martha Clan on this coming Monday, 02/06/2018.   Since they were last seen in the office, they had a CT abdomen w contrast on 01/20/2018 that showed: Soft tissue mass involving the anterior aspect of the left 6 rib is identified compatible with plasmacytoma. New from 03/09/2016. This likely accounts for the patient's left upper quadrant and rib pain. Diffuse myeloma is changes within the axial skeleton. Chronic compression fractures involving T12 and T10 are again noted. No acute findings identified within the abdomen                 ALLERGIES:  is allergic to barium-containing compounds and codeine.  Meds: Current Outpatient Medications  Medication Sig Dispense Refill  . acetaminophen (TYLENOL) 500 MG tablet Take 500 mg by mouth every 6 (six) hours as needed for mild pain  or headache.    . hyoscyamine (LEVSIN SL) 0.125 MG SL tablet PLACE 1 TABLET (0.125 MG TOTAL) UNDER THE TONGUE EVERY 4 (FOUR) HOURS AS NEEDED. 60 tablet 1  . magnesium oxide (MAG-OX) 400 (241.3 Mg) MG tablet TAKE 1 TABLET BY MOUTH TWICE A DAY 60 tablet 1  . Multiple Vitamin (MULTIVITAMIN) tablet Take 1 tablet by mouth every evening.     . ondansetron (ZOFRAN) 8 MG tablet TAKE 1 TABLET BY MOUTH TWICE A DAY FOR NAUSEA AND VOMITING 1 TABLET 1 HOUR PRIOR TO CHEMOTHERAPY 20 tablet 0  . pantoprazole (PROTONIX) 40 MG tablet Take 1 tablet (40 mg total) by mouth daily. 30 tablet 5  . polyethylene glycol powder (GLYCOLAX/MIRALAX) powder 1 capful daily as needed 255 g 3  . prochlorperazine (COMPAZINE) 10 MG tablet Take 1 tablet (10 mg total) by mouth every 6 (six) hours as needed for nausea or vomiting. 90 tablet 6  . Pyridoxine HCl (B-6) 250 MG TABS TAKE 1 TABLET (250 MG TOTAL) BY MOUTH DAILY. 30 tablet 6  . torsemide (DEMADEX) 20 MG tablet Take 1 tablet (20 mg total) by mouth daily as needed. 30 tablet 6  . Vitamin D, Ergocalciferol, (DRISDOL) 50000 units CAPS capsule TAKE 1 CAPSULE (50,000 UNITS TOTAL) BY MOUTH ONCE A WEEK. 12 capsule 3  . acyclovir (ZOVIRAX) 400 MG tablet Take 1 tablet (400 mg total) by mouth daily. (Patient not taking: Reported on 02/01/2018) 90 tablet 1  . traMADol (ULTRAM) 50 MG  tablet Take 1 tablet (50 mg total) every 6 (six) hours as needed by mouth. (Patient not taking: Reported on 02/01/2018) 60 tablet 0   No current facility-administered medications for this encounter.    Facility-Administered Medications Ordered in Other Encounters  Medication Dose Route Frequency Provider Last Rate Last Dose  . 0.9 %  sodium chloride infusion   Intravenous Once [name], MD      . sodium chloride flush (NS) 0.9 % injection 10 mL  10 mL Intracatheter PRN [name], MD   10 mL at 06/13/17 1112  . sodium chloride flush (NS) 0.9 % injection 10 mL  10 mL Intracatheter PRN [name], MD   10 mL at 09/26/17 3354    Physical Findings: The patient is in no acute distress. Patient is alert and oriented.  height is _0  (1.575 m) and weight is 173 lb 6 oz (78.6 kg). Her temperature is 98.2 F (36.8 C). Her blood pressure is 150/76 (abnormal) and her pulse is 70. Her respiration is 16 and oxygen saturation is 100%.   Lungs are clear to auscultation bilaterally. Heart has regular rate and rhythm. No palpable cervical, supraclavicular, or axillary adenopathy. Abdomen soft, non-tender, normal bowel sounds. Chest wall: Palpation along the left anterior chest wall area shows soft tissue swelling along anterior rib cage which is mildly uncomfortable with palpation for the patient.   Lab Findings: Lab Results  Component Value Date   WBC 2.6 (L) 01/16/2018   HGB 10.2 (L) 01/16/2018   HCT 31.0 (L) 01/16/2018   MCV 93.1 01/16/2018   PLT 120 (L) 01/16/2018    Radiographic Findings: Ct Abdomen W Contrast  Result Date: 01/20/2018 CLINICAL DATA:  Multiple myeloma.  Left upper quadrant and rib pain. EXAM: CT ABDOMEN WITH CONTRAST TECHNIQUE: Multidetector CT imaging of the abdomen was performed using the standard protocol following bolus administration of intravenous contrast. CONTRAST:  176m ISOVUE-300 IOPAMIDOL (ISOVUE-300) INJECTION 61% COMPARISON:  03/09/2016. FINDINGS: Lower chest: No acute abnormality. Hepatobiliary: Small cyst along the anterior dome of liver measures 7 mm. Additional small low-density foci identified within the segment 8 measuring 6 mm. Unchanged. Gallbladder appears normal. No biliary dilatation. Pancreas: No inflammation or mass identified. Spleen: Normal in size without focal abnormality. Adrenals/Urinary Tract: The adrenal glands appear normal. Unremarkable appearance of both kidneys. Stomach/Bowel: Stomach is within normal limits. Appendix appears normal. No evidence of bowel wall thickening, distention, or inflammatory changes. Vascular/Lymphatic: Aortic  atherosclerosis. No aneurysm. No upper abdominal adenopathy. Other: No free fluid or fluid collections. Musculoskeletal: Diffuse osseous changes of multiple myeloma again noted. Expansile soft tissue lesion measuring 2.9 cm involves the anterior aspect of the left seventh rib, image 9/2. Underlying lytic changes of the 6 rib noted. Stable appearance of the T12 treated compression fracture. Increase sclerosis associated with T9, T10, T11 and L1. Compression fracture involving the T10 vertebra is unchanged from 04/23/2017. IMPRESSION: 1. 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 New from 03/09/2016. This likely accounts for the patient's left upper quadrant and rib pain. 2. Diffuse myeloma is changes within the axial skeleton. Chronic compression fractures involving T12 and T10 are again noted. 3. No acute findings identified within the abdomen. Electronically Signed   By: TKerby MoorsM.D.   On: 01/20/2018 14:45    Impression:   Multiple Myeloma. Patient is experiencing some discomfort and some episodes of pain along the anterior left chest region consistent  with her lesion involving the left anterior rib cage. She would be a good candidate for a short course of palliative radiotherapy directed at this area.   Today, I talked to the patient and husband about the findings and work-up thus far.  We discussed the natural history of multiple myeloma and general treatment, highlighting the role of radiotherapy in the management.  We discussed the available radiation techniques, and focused on the details of logistics and delivery.  We reviewed the anticipated acute and late sequelae associated with radiation in this setting.  The patient was encouraged to ask questions that I answered to the best of my ability.    Patient is undecided whether she would like to proceed with this treatment as she is not having much pain at this time. She will meet  with Dr. Martha Clan on Monday, 02/06/2018 for further discussion.   Plan:  We have tentatively scheduled her for simulation and planning appointment for 02/08/2018 at 10 AM; I anticipate 8 treatments.   ____________________________________   Blair Promise, PhD, MD    This document serves as a record of services personally performed by Gery Pray, MD. It was created on his behalf by [facility], a trained medical scribe. The creation of this record is based on the scribe's personal observations and the provider's statements to them. This document has been checked and approved by the attending provider.

## 2018-02-06 ENCOUNTER — Inpatient Hospital Stay: Payer: 59

## 2018-02-06 ENCOUNTER — Inpatient Hospital Stay (HOSPITAL_BASED_OUTPATIENT_CLINIC_OR_DEPARTMENT_OTHER): Payer: 59 | Admitting: Hematology & Oncology

## 2018-02-06 ENCOUNTER — Encounter: Payer: Self-pay | Admitting: Hematology & Oncology

## 2018-02-06 ENCOUNTER — Other Ambulatory Visit: Payer: Self-pay

## 2018-02-06 VITALS — BP 128/55 | HR 70 | Temp 97.7°F | Resp 18 | Wt 172.0 lb

## 2018-02-06 DIAGNOSIS — C9000 Multiple myeloma not having achieved remission: Secondary | ICD-10-CM

## 2018-02-06 DIAGNOSIS — C9002 Multiple myeloma in relapse: Secondary | ICD-10-CM

## 2018-02-06 DIAGNOSIS — Z5112 Encounter for antineoplastic immunotherapy: Secondary | ICD-10-CM | POA: Diagnosis not present

## 2018-02-06 DIAGNOSIS — R229 Localized swelling, mass and lump, unspecified: Secondary | ICD-10-CM

## 2018-02-06 DIAGNOSIS — D500 Iron deficiency anemia secondary to blood loss (chronic): Secondary | ICD-10-CM

## 2018-02-06 LAB — CMP (CANCER CENTER ONLY)
ALK PHOS: 42 U/L (ref 26–84)
ALT: 20 U/L (ref 10–47)
ANION GAP: 3 — AB (ref 5–15)
AST: 23 U/L (ref 11–38)
Albumin: 3.9 g/dL (ref 3.5–5.0)
BILIRUBIN TOTAL: 0.6 mg/dL (ref 0.2–1.6)
BUN: 17 mg/dL (ref 7–22)
CALCIUM: 9.6 mg/dL (ref 8.0–10.3)
CO2: 27 mmol/L (ref 18–33)
Chloride: 109 mmol/L — ABNORMAL HIGH (ref 98–108)
Creatinine: 1.1 mg/dL (ref 0.60–1.20)
GLUCOSE: 92 mg/dL (ref 73–118)
Potassium: 3.9 mmol/L (ref 3.3–4.7)
Sodium: 139 mmol/L (ref 128–145)
TOTAL PROTEIN: 7.1 g/dL (ref 6.4–8.1)

## 2018-02-06 LAB — CBC WITH DIFFERENTIAL (CANCER CENTER ONLY)
Basophils Absolute: 0 10*3/uL (ref 0.0–0.1)
Basophils Relative: 1 %
Eosinophils Absolute: 0.1 10*3/uL (ref 0.0–0.5)
Eosinophils Relative: 4 %
HEMATOCRIT: 30.7 % — AB (ref 34.8–46.6)
HEMOGLOBIN: 10.2 g/dL — AB (ref 11.6–15.9)
LYMPHS ABS: 0.6 10*3/uL — AB (ref 0.9–3.3)
LYMPHS PCT: 34 %
MCH: 30.9 pg (ref 26.0–34.0)
MCHC: 33.2 g/dL (ref 32.0–36.0)
MCV: 93 fL (ref 81.0–101.0)
Monocytes Absolute: 0.4 10*3/uL (ref 0.1–0.9)
Monocytes Relative: 21 %
NEUTROS PCT: 40 %
Neutro Abs: 0.7 10*3/uL — ABNORMAL LOW (ref 1.5–6.5)
Platelet Count: 110 10*3/uL — ABNORMAL LOW (ref 145–400)
RBC: 3.3 MIL/uL — AB (ref 3.70–5.32)
RDW: 13.4 % (ref 11.1–15.7)
WBC: 1.7 10*3/uL — AB (ref 3.9–10.0)

## 2018-02-06 LAB — LACTATE DEHYDROGENASE: LDH: 198 U/L — ABNORMAL HIGH (ref 98–192)

## 2018-02-06 MED ORDER — SODIUM CHLORIDE 0.9 % IV SOLN
Freq: Once | INTRAVENOUS | Status: AC
  Administered 2018-02-06: 10:00:00 via INTRAVENOUS
  Filled 2018-02-06: qty 250

## 2018-02-06 MED ORDER — PALONOSETRON HCL INJECTION 0.25 MG/5ML
INTRAVENOUS | Status: AC
  Filled 2018-02-06: qty 5

## 2018-02-06 MED ORDER — DEXTROSE 5 % IV SOLN
37.0000 mg/m2 | Freq: Once | INTRAVENOUS | Status: AC
  Administered 2018-02-06: 70 mg via INTRAVENOUS
  Filled 2018-02-06: qty 30

## 2018-02-06 MED ORDER — SODIUM CHLORIDE 0.9 % IJ SOLN
10.0000 mL | Freq: Once | INTRAMUSCULAR | Status: AC
  Administered 2018-02-06: 10 mL
  Filled 2018-02-06: qty 10

## 2018-02-06 MED ORDER — DEXAMETHASONE SODIUM PHOSPHATE 10 MG/ML IJ SOLN
INTRAMUSCULAR | Status: AC
  Filled 2018-02-06: qty 1

## 2018-02-06 MED ORDER — PALONOSETRON HCL INJECTION 0.25 MG/5ML
0.2500 mg | Freq: Once | INTRAVENOUS | Status: AC
  Administered 2018-02-06: 0.25 mg via INTRAVENOUS

## 2018-02-06 MED ORDER — SODIUM CHLORIDE 0.9 % IV SOLN
180.0000 mg/m2 | Freq: Once | INTRAVENOUS | Status: AC
  Administered 2018-02-06: 340 mg via INTRAVENOUS
  Filled 2018-02-06: qty 17

## 2018-02-06 MED ORDER — HEPARIN SOD (PORK) LOCK FLUSH 100 UNIT/ML IV SOLN
500.0000 [IU] | Freq: Once | INTRAVENOUS | Status: AC | PRN
  Administered 2018-02-06: 500 [IU]
  Filled 2018-02-06: qty 5

## 2018-02-06 MED ORDER — SODIUM CHLORIDE 0.9% FLUSH
10.0000 mL | INTRAVENOUS | Status: DC | PRN
  Administered 2018-02-06: 10 mL
  Filled 2018-02-06: qty 10

## 2018-02-06 MED ORDER — DEXAMETHASONE SODIUM PHOSPHATE 10 MG/ML IJ SOLN
10.0000 mg | Freq: Once | INTRAMUSCULAR | Status: AC
  Administered 2018-02-06: 10 mg via INTRAVENOUS

## 2018-02-06 NOTE — Progress Notes (Signed)
Hematology and Oncology Follow Up Visit  ANGLA DELAHUNT [id number] 08-Oct-1955 62 y.o. 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 Harney is here today with her husband for follow-up.  She is doing okay although we now have a new problem.  She is having some pain in the left chest wall.  This was been going on for a few weeks.  We did get a CT scan.  Surprising, this showed an infiltrative mass at the left sixth rib.  This was felt to be consistent with a plasmacytoma.  Of note, her myeloma studies are holding steady.  Her M spike we last checked in August was 0.6 g/dL.  Her IgG level was 1400 mg/dL.  Her kappa light chain was 2.2 mg/dL.  We will go ahead and see her back in a referred to radiation oncology to see if they can could do some palliative therapy to this area.  As always, she is reluctant to do anything.  I told her that if radiation was not done, then the rib will clearly fracture and she will have more pain.  She and her husband are both doing okay.  She is eating okay.  She is had no nausea or vomiting.  She is had no fever.  She has had no cough or shortness of breath.  There is been no leg swelling.  She has had no diarrhea.  Overall, her performance status is ECOG 1.  Medications:  Allergies as of 02/06/2018      Reactions   Barium-containing Compounds Diarrhea   Oral Redicat gives pt severe diarrhea, causing delay in scan time, see previous ct scan notes for details   Codeine  Palpitations      Medication List        Accurate as of 02/06/18  8:58 AM. Always use your most recent med list.          acetaminophen 500 MG tablet Commonly known as:  TYLENOL Take 500 mg by mouth every 6 (six) hours as needed for mild pain or headache.   acyclovir 400 MG tablet Commonly known as:  ZOVIRAX Take 1 tablet (400 mg total) by mouth daily.   B-6 250 MG Tabs TAKE 1 TABLET (250 MG TOTAL) BY MOUTH DAILY.   hyoscyamine 0.125 MG SL tablet Commonly known as:  LEVSIN SL PLACE 1 TABLET (0.125 MG TOTAL) UNDER THE TONGUE EVERY 4 (FOUR) HOURS AS NEEDED.   magnesium oxide 400 (241.3 Mg) MG tablet Commonly known as:  MAG-OX TAKE 1 TABLET BY MOUTH TWICE A DAY   multivitamin tablet Take 1 tablet by mouth every evening.   ondansetron 8 MG tablet Commonly known as:  ZOFRAN TAKE 1 TABLET BY MOUTH TWICE A DAY FOR NAUSEA AND VOMITING 1 TABLET 1 HOUR PRIOR TO CHEMOTHERAPY   pantoprazole 40 MG tablet Commonly known as:  PROTONIX Take 1 tablet (40 mg total) by mouth daily.   polyethylene glycol powder powder Commonly known as:  GLYCOLAX/MIRALAX 1 capful daily as needed   prochlorperazine 10  MG tablet Commonly known as:  COMPAZINE Take 1 tablet (10 mg total) by mouth every 6 (six) hours as needed for nausea or vomiting.   torsemide 20 MG tablet Commonly known as:  DEMADEX Take 1 tablet (20 mg total) by mouth daily as needed.   traMADol 50 MG tablet Commonly known as:  ULTRAM Take 1 tablet (50 mg total) every 6 (six) hours as needed by mouth.   Vitamin D (Ergocalciferol) 50000 units Caps capsule Commonly known as:  DRISDOL TAKE 1 CAPSULE (50,000 UNITS TOTAL) BY MOUTH ONCE A WEEK.       Allergies:  Allergies  Allergen Reactions  . Barium-Containing Compounds Diarrhea    Oral Redicat gives pt severe diarrhea, causing delay in scan time, see previous ct scan notes for details  . Codeine Palpitations    Past Medical History, Surgical history, Social history, and  Family History were reviewed and updated.  Review of Systems: Review of Systems  Constitutional: Negative.   HENT: Negative.   Eyes: Negative.   Respiratory: Negative.   Cardiovascular: Negative.   Gastrointestinal: Negative.   Genitourinary: Negative.   Musculoskeletal: Positive for myalgias.  Skin: Negative.   Neurological: Negative.   Endo/Heme/Allergies: Negative.   Psychiatric/Behavioral: Negative.    Marland Kitchen   Physical Exam:  weight is 172 lb (78 kg). Her oral temperature is 97.7 F (36.5 C). Her blood pressure is 128/55 (abnormal) and her pulse is 70. Her respiration is 18 and oxygen saturation is 100%.   Wt Readings from Last 3 Encounters:  02/06/18 172 lb (78 kg)  02/01/18 173 lb 6 oz (78.6 kg)  01/16/18 170 lb (77.1 kg)    Physical Exam  Constitutional: She is oriented to person, place, and time.  HENT:  Head: Normocephalic and atraumatic.  Mouth/Throat: Oropharynx is clear and moist.  Eyes: Pupils are equal, round, and reactive to light. EOM are normal.  Neck: Normal range of motion.  Cardiovascular: Normal rate, regular rhythm and normal heart sounds.  Pulmonary/Chest: Effort normal and breath sounds normal.  Abdominal: Soft. Bowel sounds are normal.  Musculoskeletal: Normal range of motion. She exhibits no edema, tenderness or deformity.  Lymphadenopathy:    She has no cervical adenopathy.  Neurological: She is alert and oriented to person, place, and time.  Skin: Skin is warm and dry. No rash noted. No erythema.  Psychiatric: She has a normal mood and affect. Her behavior is normal. Judgment and thought content normal.  Vitals reviewed.    Lab Results  Component Value Date   WBC 1.7 (L) 02/06/2018   HGB 10.2 (L) 02/06/2018   HCT 30.7 (L) 02/06/2018   MCV 93.0 02/06/2018   PLT 110 (L) 02/06/2018   Lab Results  Component Value Date   FERRITIN 568 (H) 12/12/2017   IRON 74 12/12/2017   TIBC 269 12/12/2017   UIBC 195 12/12/2017   IRONPCTSAT 27  12/12/2017   Lab Results  Component Value Date   RETICCTPCT 1.5 12/12/2017   RBC 3.30 (L) 02/06/2018   RETICCTABS 35.9 12/25/2014   Lab Results  Component Value Date   KPAFRELGTCHN 22.2 (H) 01/16/2018   LAMBDASER 8.5 01/16/2018   KAPLAMBRATIO 2.61 (H) 01/16/2018   Lab Results  Component Value Date   IGGSERUM 1,402 01/16/2018   IGA 34 (L) 01/16/2018   IGMSERUM 9 (L) 01/16/2018   Lab Results  Component Value Date   TOTALPROTELP 6.6 01/16/2018   ALBUMINELP 3.8 01/16/2018   A1GS 0.2 01/16/2018   A2GS 0.7 01/16/2018  BETS 0.7 01/16/2018   BETA2SER 0.3 05/07/2015   GAMS 1.2 01/16/2018   MSPIKE 0.6 (H) 01/16/2018   SPEI Comment 12/12/2017     Chemistry      Component Value Date/Time   NA 142 01/16/2018 1124   NA 143 05/09/2017 0812   NA 139 02/20/2016 1207   K 4.2 01/16/2018 1124   K 4.0 05/09/2017 0812   K 3.8 02/20/2016 1207   CL 110 (H) 01/16/2018 1124   CL 108 05/09/2017 0812   CO2 27 01/16/2018 1124   CO2 26 05/09/2017 0812   CO2 29 02/20/2016 1207   BUN 23 (H) 01/16/2018 1124   BUN 13 05/09/2017 0812   BUN 22.3 02/20/2016 1207   CREATININE 1.20 01/16/2018 1124   CREATININE 0.9 05/09/2017 0812   CREATININE 1.3 (H) 02/20/2016 1207      Component Value Date/Time   CALCIUM 9.8 01/16/2018 1124   CALCIUM 8.3 05/09/2017 0812   CALCIUM 9.7 02/20/2016 1207   ALKPHOS 44 01/16/2018 1124   ALKPHOS 123 (H) 05/09/2017 0812   ALKPHOS 62 02/20/2016 1207   AST 26 01/16/2018 1124   AST 27 02/20/2016 1207   ALT 22 01/16/2018 1124   ALT 26 05/09/2017 0812   ALT 19 02/20/2016 1207   BILITOT 0.6 01/16/2018 1124   BILITOT 0.44 02/20/2016 1207      Impression and Plan: Ms. Champa is a very pleasant 62 yo African American female with IgG kappa myeloma. Her M-spike has remained stable and she is tolerating treatment well.   I am a little bit surprised about this CT finding of the plasmacytoma.  I know that we do not have a biopsy of this.  However, I really would be  shocked if Ms. Colvard would agree to a biopsy.  The radiologist feels fairly confident that this is a plasmacytoma.  Again, I think radiation therapy would be reasonable.  It would be well-tolerated and should be highly effective.  For right now, I do not see that we had to make any changes with her protocol.  I told Ms. Allayne Stack and her husband that our options for systemic therapy are becoming limited.  She might be a candidate for CAR-T therapy.  Again, this would involve traveling.  I know that we do have other options that we can use here for her myeloma if necessary.  We are following her fairly closely.  I will plan to have her come back to see me in another few weeks.     [name], MD 9/30/20198:58 AM

## 2018-02-06 NOTE — Patient Instructions (Signed)
[facility] Discharge Instructions for Patients Receiving Chemotherapy  Today you received the following chemotherapy agents:  Kyprolis & Cytoxan  To help prevent nausea and vomiting after your treatment, we encourage you to take your nausea medication as prescribed.    If you develop nausea and vomiting that is not controlled by your nausea medication, call the clinic.   BELOW ARE SYMPTOMS THAT SHOULD BE REPORTED IMMEDIATELY:  *FEVER GREATER THAN 100.5 F  *CHILLS WITH OR WITHOUT FEVER  NAUSEA AND VOMITING THAT IS NOT CONTROLLED WITH YOUR NAUSEA MEDICATION  *UNUSUAL SHORTNESS OF BREATH  *UNUSUAL BRUISING OR BLEEDING  TENDERNESS IN MOUTH AND THROAT WITH OR WITHOUT PRESENCE OF ULCERS  *URINARY PROBLEMS  *BOWEL PROBLEMS  UNUSUAL RASH Items with * indicate a potential emergency and should be followed up as soon as possible.  Feel free to call the clinic should you have any questions or concerns. The clinic phone number is [phone].  Please show the CHEMO ALERT CARD at check-in to the Emergency Department and triage nurse.   

## 2018-02-06 NOTE — Patient Instructions (Signed)
Implanted Port Home Guide An implanted port is a type of central line that is placed under the skin. Central lines are used to provide IV access when treatment or nutrition needs to be given through a person's veins. Implanted ports are used for long-term IV access. An implanted port may be placed because:  You need IV medicine that would be irritating to the small veins in your hands or arms.  You need long-term IV medicines, such as antibiotics.  You need IV nutrition for a long period.  You need frequent blood draws for lab tests.  You need dialysis.  Implanted ports are usually placed in the chest area, but they can also be placed in the upper arm, the abdomen, or the leg. An implanted port has two main parts:  Reservoir. The reservoir is round and will appear as a small, raised area under your skin. The reservoir is the part where a needle is inserted to give medicines or draw blood.  Catheter. The catheter is a thin, flexible tube that extends from the reservoir. The catheter is placed into a large vein. Medicine that is inserted into the reservoir goes into the catheter and then into the vein.  How will I care for my incision site? Do not get the incision site wet. Bathe or shower as directed by your health care provider. How is my port accessed? Special steps must be taken to access the port:  Before the port is accessed, a numbing cream can be placed on the skin. This helps numb the skin over the port site.  Your health care provider uses a sterile technique to access the port. ? Your health care provider must put on a mask and sterile gloves. ? The skin over your port is cleaned carefully with an antiseptic and allowed to dry. ? The port is gently pinched between sterile gloves, and a needle is inserted into the port.  Only "non-coring" port needles should be used to access the port. Once the port is accessed, a blood return should be checked. This helps ensure that the port  is in the vein and is not clogged.  If your port needs to remain accessed for a constant infusion, a clear (transparent) bandage will be placed over the needle site. The bandage and needle will need to be changed every week, or as directed by your health care provider.  Keep the bandage covering the needle clean and dry. Do not get it wet. Follow your health care provider's instructions on how to take a shower or bath while the port is accessed.  If your port does not need to stay accessed, no bandage is needed over the port.  What is flushing? Flushing helps keep the port from getting clogged. Follow your health care provider's instructions on how and when to flush the port. Ports are usually flushed with saline solution or a medicine called heparin. The need for flushing will depend on how the port is used.  If the port is used for intermittent medicines or blood draws, the port will need to be flushed: ? After medicines have been given. ? After blood has been drawn. ? As part of routine maintenance.  If a constant infusion is running, the port may not need to be flushed.  How long will my port stay implanted? The port can stay in for as long as your health care provider thinks it is needed. When it is time for the port to come out, surgery will be   done to remove it. The procedure is similar to the one performed when the port was put in. When should I seek immediate medical care? When you have an implanted port, you should seek immediate medical care if:  You notice a bad smell coming from the incision site.  You have swelling, redness, or drainage at the incision site.  You have more swelling or pain at the port site or the surrounding area.  You have a fever that is not controlled with medicine.  This information is not intended to replace advice given to you by your health care provider. Make sure you discuss any questions you have with your health care provider. Document  Released: 04/26/2005 Document Revised: 10/02/2015 Document Reviewed: 01/01/2013 Elsevier Interactive Patient Education  2017 [employer]  

## 2018-02-06 NOTE — Progress Notes (Signed)
Ok to treat with ANC 0.7 per Dr. Marin Olp.

## 2018-02-07 ENCOUNTER — Inpatient Hospital Stay: Payer: 59 | Attending: Hematology & Oncology

## 2018-02-07 DIAGNOSIS — K580 Irritable bowel syndrome with diarrhea: Secondary | ICD-10-CM | POA: Insufficient documentation

## 2018-02-07 DIAGNOSIS — C9000 Multiple myeloma not having achieved remission: Secondary | ICD-10-CM | POA: Diagnosis present

## 2018-02-07 DIAGNOSIS — Z79899 Other long term (current) drug therapy: Secondary | ICD-10-CM | POA: Insufficient documentation

## 2018-02-07 DIAGNOSIS — Z5112 Encounter for antineoplastic immunotherapy: Secondary | ICD-10-CM | POA: Insufficient documentation

## 2018-02-07 DIAGNOSIS — C9002 Multiple myeloma in relapse: Secondary | ICD-10-CM

## 2018-02-07 LAB — PROTEIN ELECTROPHORESIS, SERUM
A/G Ratio: 1.2 (ref 0.7–1.7)
ALPHA-1-GLOBULIN: 0.2 g/dL (ref 0.0–0.4)
Albumin ELP: 3.7 g/dL (ref 2.9–4.4)
Alpha-2-Globulin: 0.5 g/dL (ref 0.4–1.0)
Beta Globulin: 0.9 g/dL (ref 0.7–1.3)
GAMMA GLOBULIN: 1.3 g/dL (ref 0.4–1.8)
Globulin, Total: 3 g/dL (ref 2.2–3.9)
M-SPIKE, %: 0.8 g/dL — AB
Total Protein ELP: 6.7 g/dL (ref 6.0–8.5)

## 2018-02-07 LAB — KAPPA/LAMBDA LIGHT CHAINS
KAPPA FREE LGHT CHN: 19.7 mg/L — AB (ref 3.3–19.4)
Kappa, lambda light chain ratio: 2.53 — ABNORMAL HIGH (ref 0.26–1.65)
Lambda free light chains: 7.8 mg/L (ref 5.7–26.3)

## 2018-02-07 LAB — IGG, IGA, IGM
IGA: 29 mg/dL — AB (ref 87–352)
IGM (IMMUNOGLOBULIN M), SRM: 8 mg/dL — AB (ref 26–217)
IgG (Immunoglobin G), Serum: 1396 mg/dL (ref 700–1600)

## 2018-02-07 MED ORDER — SODIUM CHLORIDE 0.9 % IV SOLN
Freq: Once | INTRAVENOUS | Status: AC
  Administered 2018-02-07: 09:00:00 via INTRAVENOUS
  Filled 2018-02-07: qty 250

## 2018-02-07 MED ORDER — DEXTROSE 5 % IV SOLN
37.0000 mg/m2 | Freq: Once | INTRAVENOUS | Status: AC
  Administered 2018-02-07: 70 mg via INTRAVENOUS
  Filled 2018-02-07: qty 5

## 2018-02-07 MED ORDER — HEPARIN SOD (PORK) LOCK FLUSH 100 UNIT/ML IV SOLN
500.0000 [IU] | Freq: Once | INTRAVENOUS | Status: AC | PRN
  Administered 2018-02-07: 500 [IU]
  Filled 2018-02-07: qty 5

## 2018-02-07 MED ORDER — SODIUM CHLORIDE 0.9% FLUSH
10.0000 mL | INTRAVENOUS | Status: DC | PRN
  Administered 2018-02-07: 10 mL
  Filled 2018-02-07: qty 10

## 2018-02-07 MED ORDER — DEXAMETHASONE SODIUM PHOSPHATE 10 MG/ML IJ SOLN
10.0000 mg | Freq: Once | INTRAMUSCULAR | Status: AC
  Administered 2018-02-07: 10 mg via INTRAVENOUS

## 2018-02-07 MED ORDER — DEXAMETHASONE SODIUM PHOSPHATE 10 MG/ML IJ SOLN
INTRAMUSCULAR | Status: AC
  Filled 2018-02-07: qty 1

## 2018-02-08 ENCOUNTER — Telehealth: Payer: Self-pay

## 2018-02-08 ENCOUNTER — Ambulatory Visit: Payer: 59 | Admitting: Radiation Oncology

## 2018-02-08 NOTE — Telephone Encounter (Signed)
Attempted to contact pt on cell number. Left VM with this RN's direct number for return call. Loma Sousa, RN BSN

## 2018-02-08 NOTE — Telephone Encounter (Signed)
Attempted to contact pt on home number. Left VM with this RN's direct number for return call. Loma Sousa, RN BSN

## 2018-02-16 ENCOUNTER — Encounter: Payer: Self-pay | Admitting: Hematology & Oncology

## 2018-02-20 ENCOUNTER — Other Ambulatory Visit: Payer: 59

## 2018-02-20 ENCOUNTER — Ambulatory Visit: Payer: 59

## 2018-02-21 ENCOUNTER — Ambulatory Visit: Payer: 59

## 2018-02-27 ENCOUNTER — Other Ambulatory Visit: Payer: 59

## 2018-02-27 ENCOUNTER — Ambulatory Visit: Payer: 59

## 2018-02-27 ENCOUNTER — Ambulatory Visit: Payer: 59 | Admitting: Family

## 2018-02-28 ENCOUNTER — Ambulatory Visit: Payer: 59

## 2018-03-02 ENCOUNTER — Telehealth: Payer: Self-pay | Admitting: Internal Medicine

## 2018-03-02 NOTE — Telephone Encounter (Signed)
Pt states she has been having some stomach upset and loose stools, she has been taking pepto bismol. Reports she is still having loose stools. Discussed with pt that she could try Imodium and see if that helps. Pt will call back if this does not help.

## 2018-03-03 ENCOUNTER — Other Ambulatory Visit: Payer: Self-pay | Admitting: Hematology & Oncology

## 2018-03-03 DIAGNOSIS — C9000 Multiple myeloma not having achieved remission: Secondary | ICD-10-CM

## 2018-03-06 ENCOUNTER — Inpatient Hospital Stay: Payer: 59

## 2018-03-06 ENCOUNTER — Encounter: Payer: Self-pay | Admitting: Hematology & Oncology

## 2018-03-06 ENCOUNTER — Inpatient Hospital Stay (HOSPITAL_BASED_OUTPATIENT_CLINIC_OR_DEPARTMENT_OTHER): Payer: 59 | Admitting: Hematology & Oncology

## 2018-03-06 ENCOUNTER — Other Ambulatory Visit: Payer: Self-pay

## 2018-03-06 VITALS — BP 133/73 | HR 88 | Temp 98.4°F | Resp 18 | Wt 166.0 lb

## 2018-03-06 DIAGNOSIS — C9002 Multiple myeloma in relapse: Secondary | ICD-10-CM

## 2018-03-06 DIAGNOSIS — C9000 Multiple myeloma not having achieved remission: Secondary | ICD-10-CM

## 2018-03-06 DIAGNOSIS — K580 Irritable bowel syndrome with diarrhea: Secondary | ICD-10-CM

## 2018-03-06 DIAGNOSIS — Z5112 Encounter for antineoplastic immunotherapy: Secondary | ICD-10-CM | POA: Diagnosis not present

## 2018-03-06 DIAGNOSIS — D500 Iron deficiency anemia secondary to blood loss (chronic): Secondary | ICD-10-CM

## 2018-03-06 LAB — IRON AND TIBC
IRON: 86 ug/dL (ref 41–142)
SATURATION RATIOS: 34 % (ref 21–57)
TIBC: 255 ug/dL (ref 236–444)
UIBC: 169 ug/dL

## 2018-03-06 LAB — CMP (CANCER CENTER ONLY)
ALBUMIN: 4.1 g/dL (ref 3.5–5.0)
ALT: 25 U/L (ref 10–47)
AST: 31 U/L (ref 11–38)
Alkaline Phosphatase: 41 U/L (ref 26–84)
Anion gap: 2 — ABNORMAL LOW (ref 5–15)
BUN: 13 mg/dL (ref 7–22)
CALCIUM: 9.7 mg/dL (ref 8.0–10.3)
CHLORIDE: 112 mmol/L — AB (ref 98–108)
CO2: 26 mmol/L (ref 18–33)
CREATININE: 1.1 mg/dL (ref 0.60–1.20)
Glucose, Bld: 89 mg/dL (ref 73–118)
Potassium: 3.6 mmol/L (ref 3.3–4.7)
SODIUM: 140 mmol/L (ref 128–145)
Total Bilirubin: 0.8 mg/dL (ref 0.2–1.6)
Total Protein: 7.3 g/dL (ref 6.4–8.1)

## 2018-03-06 LAB — CBC WITH DIFFERENTIAL (CANCER CENTER ONLY)
ABS IMMATURE GRANULOCYTES: 0.02 10*3/uL (ref 0.00–0.07)
BASOS ABS: 0 10*3/uL (ref 0.0–0.1)
Basophils Relative: 1 %
Eosinophils Absolute: 0.1 10*3/uL (ref 0.0–0.5)
Eosinophils Relative: 4 %
HEMATOCRIT: 33.5 % — AB (ref 36.0–46.0)
Hemoglobin: 10.7 g/dL — ABNORMAL LOW (ref 12.0–15.0)
IMMATURE GRANULOCYTES: 1 %
LYMPHS ABS: 0.7 10*3/uL (ref 0.7–4.0)
LYMPHS PCT: 34 %
MCH: 29.5 pg (ref 26.0–34.0)
MCHC: 31.9 g/dL (ref 30.0–36.0)
MCV: 92.3 fL (ref 80.0–100.0)
Monocytes Absolute: 0.3 10*3/uL (ref 0.1–1.0)
Monocytes Relative: 17 %
NEUTROS ABS: 0.8 10*3/uL — AB (ref 1.7–7.7)
NEUTROS PCT: 43 %
Platelet Count: 108 10*3/uL — ABNORMAL LOW (ref 150–400)
RBC: 3.63 MIL/uL — AB (ref 3.87–5.11)
RDW: 13.4 % (ref 11.5–15.5)
WBC: 1.9 10*3/uL — AB (ref 4.0–10.5)
nRBC: 0 % (ref 0.0–0.2)

## 2018-03-06 LAB — FERRITIN: FERRITIN: 435 ng/mL — AB (ref 11–307)

## 2018-03-06 MED ORDER — HEPARIN SOD (PORK) LOCK FLUSH 100 UNIT/ML IV SOLN
500.0000 [IU] | Freq: Once | INTRAVENOUS | Status: AC
  Administered 2018-03-06: 500 [IU] via INTRAVENOUS
  Filled 2018-03-06: qty 5

## 2018-03-06 MED ORDER — SODIUM CHLORIDE 0.9% FLUSH
10.0000 mL | INTRAVENOUS | Status: DC | PRN
  Administered 2018-03-06: 10 mL via INTRAVENOUS
  Filled 2018-03-06: qty 10

## 2018-03-06 MED ORDER — METRONIDAZOLE 500 MG PO TABS: 500.0000 mg | ORAL_TABLET | Freq: Three times a day (TID) | ORAL | 0 refills | Status: DC

## 2018-03-06 NOTE — Progress Notes (Signed)
No chemo today per dr. Marin Olp

## 2018-03-06 NOTE — Patient Instructions (Signed)
Implanted Port Insertion, Care After °This sheet gives you information about how to care for yourself after your procedure. Your health care provider may also give you more specific instructions. If you have problems or questions, contact your health care provider. °What can I expect after the procedure? °After your procedure, it is common to have: °· Discomfort at the port insertion site. °· Bruising on the skin over the port. This should improve over 3-4 days. ° °Follow these instructions at home: °Port care °· After your port is placed, you will get a manufacturer's information card. The card has information about your port. Keep this card with you at all times. °· Take care of the port as told by your health care provider. Ask your health care provider if you or a family member can get training for taking care of the port at home. A home health care nurse may also take care of the port. °· Make sure to remember what type of port you have. °Incision care °· Follow instructions from your health care provider about how to take care of your port insertion site. Make sure you: °? Wash your hands with soap and water before you change your bandage (dressing). If soap and water are not available, use hand sanitizer. °? Change your dressing as told by your health care provider. °? Leave stitches (sutures), skin glue, or adhesive strips in place. These skin closures may need to stay in place for 2 weeks or longer. If adhesive strip edges start to loosen and curl up, you may trim the loose edges. Do not remove adhesive strips completely unless your health care provider tells you to do that. °· Check your port insertion site every day for signs of infection. Check for: °? More redness, swelling, or pain. °? More fluid or blood. °? Warmth. °? Pus or a bad smell. °General instructions °· Do not take baths, swim, or use a hot tub until your health care provider approves. °· Do not lift anything that is heavier than 10 lb (4.5  kg) for a week, or as told by your health care provider. °· Ask your health care provider when it is okay to: °? Return to work or school. °? Resume usual physical activities or sports. °· Do not drive for 24 hours if you were given a medicine to help you relax (sedative). °· Take over-the-counter and prescription medicines only as told by your health care provider. °· Wear a medical alert bracelet in case of an emergency. This will tell any health care providers that you have a port. °· Keep all follow-up visits as told by your health care provider. This is important. °Contact a health care provider if: °· You cannot flush your port with saline as directed, or you cannot draw blood from the port. °· You have a fever or chills. °· You have more redness, swelling, or pain around your port insertion site. °· You have more fluid or blood coming from your port insertion site. °· Your port insertion site feels warm to the touch. °· You have pus or a bad smell coming from the port insertion site. °Get help right away if: °· You have chest pain or shortness of breath. °· You have bleeding from your port that you cannot control. °Summary °· Take care of the port as told by your health care provider. °· Change your dressing as told by your health care provider. °· Keep all follow-up visits as told by your health care provider. °  This information is not intended to replace advice given to you by your health care provider. Make sure you discuss any questions you have with your health care provider. °Document Released: 02/14/2013 Document Revised: 03/17/2016 Document Reviewed: 03/17/2016 °[employer]vier Interactive Patient Education © 2017 [employer] ° °

## 2018-03-06 NOTE — Progress Notes (Signed)
Hematology and Oncology Follow Up Visit  Barbara Cook [id number] March 27, 1956 62 y.o. 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  Unfortunately, she is yet to start radiation therapy.  She gets on the Internet and reads all the horrible things that can happen with radiation therapy.  She does not want the radiation to affect her heart.  I told her that she is not going to get enough radiation therapy to cause any kind of cardiac damage.  I think she is going to get 10 treatments of radiation and not 35-40 treatments.  She says that her IBS is acting up.  She does not want treatment today.  I will go ahead and try her on a little Flagyl.  Maybe this will help this IBS.  She sees the gastroenterologist next week.  She is really not hurting.  She does not complain of pain over on the left side.  Her myeloma studies are stable.  Her M spike was 0.8 g/dL.  Her IgG level was 1400 mg/dL.  The Kappa Lightchain was 2 mg/dL.  Her appetite is good.  She is had no problems with nausea.  She has some diarrhea up from the IBS.  Overall, her performance status is ECOG 1.    Medications:  Allergies as of 03/06/2018      Reactions   Barium-containing Compounds Diarrhea   Oral Redicat gives pt severe diarrhea, causing delay in scan time, see previous ct scan notes for details   Codeine Palpitations      Medication List        Accurate as of 03/06/18  9:21  AM. Always use your most recent med list.          acetaminophen 500 MG tablet Commonly known as:  TYLENOL Take 500 mg by mouth every 6 (six) hours as needed for mild pain or headache.   acyclovir 400 MG tablet Commonly known as:  ZOVIRAX Take 1 tablet (400 mg total) by mouth daily.   B-6 250 MG Tabs TAKE 1 TABLET (250 MG TOTAL) BY MOUTH DAILY.   hyoscyamine 0.125 MG SL tablet Commonly known as:  LEVSIN SL PLACE 1 TABLET (0.125 MG TOTAL) UNDER THE TONGUE EVERY 4 (FOUR) HOURS AS NEEDED.   magnesium oxide 400 (241.3 Mg) MG tablet Commonly known as:  MAG-OX TAKE 1 TABLET BY MOUTH TWICE A DAY   multivitamin tablet Take 1 tablet by mouth every evening.   ondansetron 8 MG tablet Commonly known as:  ZOFRAN TAKE 1 TABLET BY MOUTH TWICE A DAY FOR NAUSEA AND VOMITING 1 TABLET 1 HOUR PRIOR TO CHEMOTHERAPY   pantoprazole 40 MG tablet Commonly known as:  PROTONIX Take 1 tablet (40 mg total) by mouth daily.   polyethylene glycol powder powder Commonly known as:  GLYCOLAX/MIRALAX 1 capful daily as needed   prochlorperazine 10 MG tablet Commonly known as:  COMPAZINE Take 1 tablet (10 mg total) by mouth every 6 (six) hours as needed for  nausea or vomiting.   torsemide 20 MG tablet Commonly known as:  DEMADEX Take 1 tablet (20 mg total) by mouth daily as needed.   traMADol 50 MG tablet Commonly known as:  ULTRAM Take 1 tablet (50 mg total) every 6 (six) hours as needed by mouth.   Vitamin D (Ergocalciferol) 50000 units Caps capsule Commonly known as:  DRISDOL TAKE 1 CAPSULE (50,000 UNITS TOTAL) BY MOUTH ONCE A WEEK.       Allergies:  Allergies  Allergen Reactions  . Barium-Containing Compounds Diarrhea    Oral Redicat gives pt severe diarrhea, causing delay in scan time, see previous ct scan notes for details  . Codeine Palpitations    Past Medical History, Surgical history, Social history, and Family History were reviewed and updated.  Review of Systems: Review of  Systems  Constitutional: Negative.   HENT: Negative.   Eyes: Negative.   Respiratory: Negative.   Cardiovascular: Negative.   Gastrointestinal: Negative.   Genitourinary: Negative.   Musculoskeletal: Positive for myalgias.  Skin: Negative.   Neurological: Negative.   Endo/Heme/Allergies: Negative.   Psychiatric/Behavioral: Negative.    Marland Kitchen   Physical Exam:  weight is 166 lb (75.3 kg). Her oral temperature is 98.4 F (36.9 C). Her blood pressure is 133/73 and her pulse is 88. Her respiration is 18 and oxygen saturation is 100%.   Wt Readings from Last 3 Encounters:  03/06/18 166 lb (75.3 kg)  02/06/18 172 lb (78 kg)  02/01/18 173 lb 6 oz (78.6 kg)    Physical Exam  Constitutional: She is oriented to person, place, and time.  HENT:  Head: Normocephalic and atraumatic.  Mouth/Throat: Oropharynx is clear and moist.  Eyes: Pupils are equal, round, and reactive to light. EOM are normal.  Neck: Normal range of motion.  Cardiovascular: Normal rate, regular rhythm and normal heart sounds.  Pulmonary/Chest: Effort normal and breath sounds normal.  Abdominal: Soft. Bowel sounds are normal.  Musculoskeletal: Normal range of motion. She exhibits no edema, tenderness or deformity.  Lymphadenopathy:    She has no cervical adenopathy.  Neurological: She is alert and oriented to person, place, and time.  Skin: Skin is warm and dry. No rash noted. No erythema.  Psychiatric: She has a normal mood and affect. Her behavior is normal. Judgment and thought content normal.  Vitals reviewed.    Lab Results  Component Value Date   WBC 1.7 (L) 02/06/2018   HGB 10.2 (L) 02/06/2018   HCT 30.7 (L) 02/06/2018   MCV 93.0 02/06/2018   PLT 110 (L) 02/06/2018   Lab Results  Component Value Date   FERRITIN 568 (H) 12/12/2017   IRON 74 12/12/2017   TIBC 269 12/12/2017   UIBC 195 12/12/2017   IRONPCTSAT 27 12/12/2017   Lab Results  Component Value Date   RETICCTPCT 1.5 12/12/2017   RBC 3.30  (L) 02/06/2018   RETICCTABS 35.9 12/25/2014   Lab Results  Component Value Date   KPAFRELGTCHN 19.7 (H) 02/06/2018   LAMBDASER 7.8 02/06/2018   KAPLAMBRATIO 2.53 (H) 02/06/2018   Lab Results  Component Value Date   IGGSERUM 1,396 02/06/2018   IGA 29 (L) 02/06/2018   IGMSERUM 8 (L) 02/06/2018   Lab Results  Component Value Date   TOTALPROTELP 6.7 02/06/2018   ALBUMINELP 3.7 02/06/2018   A1GS 0.2 02/06/2018   A2GS 0.5 02/06/2018   BETS 0.9 02/06/2018   BETA2SER 0.3 05/07/2015   GAMS 1.3 02/06/2018   MSPIKE 0.8 (H) 02/06/2018   SPEI  Comment 02/06/2018     Chemistry      Component Value Date/Time   NA 139 02/06/2018 0816   NA 143 05/09/2017 0812   NA 139 02/20/2016 1207   K 3.9 02/06/2018 0816   K 4.0 05/09/2017 0812   K 3.8 02/20/2016 1207   CL 109 (H) 02/06/2018 0816   CL 108 05/09/2017 0812   CO2 27 02/06/2018 0816   CO2 26 05/09/2017 0812   CO2 29 02/20/2016 1207   BUN 17 02/06/2018 0816   BUN 13 05/09/2017 0812   BUN 22.3 02/20/2016 1207   CREATININE 1.10 02/06/2018 0816   CREATININE 0.9 05/09/2017 0812   CREATININE 1.3 (H) 02/20/2016 1207      Component Value Date/Time   CALCIUM 9.6 02/06/2018 0816   CALCIUM 8.3 05/09/2017 0812   CALCIUM 9.7 02/20/2016 1207   ALKPHOS 42 02/06/2018 0816   ALKPHOS 123 (H) 05/09/2017 0812   ALKPHOS 62 02/20/2016 1207   AST 23 02/06/2018 0816   AST 27 02/20/2016 1207   ALT 20 02/06/2018 0816   ALT 26 05/09/2017 0812   ALT 19 02/20/2016 1207   BILITOT 0.6 02/06/2018 0816   BILITOT 0.44 02/20/2016 1207      Impression and Plan: Ms. Gayden is a very pleasant 62 yo African American female with IgG kappa myeloma. Her M-spike has remained stable and she is tolerating treatment well.   Hopefully, she will do the radiation treatments.  I talked her about this at length.  Again I convinced her that the radiation would not cause her problems with the heart.  We will start her chemotherapy next week.  She was having next week  after she sees her gastroenterologist.  May be, the Flagyl will help her.  She really has made it difficult in some respect.  I know that she does a lot of Internet searching.  I have tried to convince her that what we are doing is to try to help her out.  Again, she has dictated a lot of what we do with her.  I will plan to see her back in 1 month.    [name], MD 10/28/20199:21 AM

## 2018-03-07 ENCOUNTER — Inpatient Hospital Stay: Payer: 59

## 2018-03-07 LAB — IGG, IGA, IGM
IGA: 29 mg/dL — AB (ref 87–352)
IGM (IMMUNOGLOBULIN M), SRM: 7 mg/dL — AB (ref 26–217)
IgG (Immunoglobin G), Serum: 1665 mg/dL — ABNORMAL HIGH (ref 700–1600)

## 2018-03-07 LAB — KAPPA/LAMBDA LIGHT CHAINS
KAPPA, LAMDA LIGHT CHAIN RATIO: 3.17 — AB (ref 0.26–1.65)
Kappa free light chain: 21.9 mg/L — ABNORMAL HIGH (ref 3.3–19.4)
Lambda free light chains: 6.9 mg/L (ref 5.7–26.3)

## 2018-03-09 LAB — IMMUNOFIXATION REFLEX, SERUM
IgA: 31 mg/dL — ABNORMAL LOW (ref 87–352)
IgG (Immunoglobin G), Serum: 1765 mg/dL — ABNORMAL HIGH (ref 700–1600)
IgM (Immunoglobulin M), Srm: 8 mg/dL — ABNORMAL LOW (ref 26–217)

## 2018-03-09 LAB — PROTEIN ELECTROPHORESIS, SERUM, WITH REFLEX
A/G RATIO SPE: 1.3 (ref 0.7–1.7)
ALBUMIN ELP: 3.8 g/dL (ref 2.9–4.4)
ALPHA-1-GLOBULIN: 0.2 g/dL (ref 0.0–0.4)
Alpha-2-Globulin: 0.5 g/dL (ref 0.4–1.0)
Beta Globulin: 0.8 g/dL (ref 0.7–1.3)
Gamma Globulin: 1.5 g/dL (ref 0.4–1.8)
Globulin, Total: 3 g/dL (ref 2.2–3.9)
M-Spike, %: 1.1 g/dL — ABNORMAL HIGH
SPEP INTERP: 0
TOTAL PROTEIN ELP: 6.8 g/dL (ref 6.0–8.5)

## 2018-03-13 ENCOUNTER — Other Ambulatory Visit: Payer: Self-pay | Admitting: Oncology

## 2018-03-13 DIAGNOSIS — D500 Iron deficiency anemia secondary to blood loss (chronic): Secondary | ICD-10-CM

## 2018-03-13 DIAGNOSIS — C9002 Multiple myeloma in relapse: Secondary | ICD-10-CM

## 2018-03-14 ENCOUNTER — Other Ambulatory Visit: Payer: Self-pay | Admitting: *Deleted

## 2018-03-14 ENCOUNTER — Encounter: Payer: Self-pay | Admitting: Gastroenterology

## 2018-03-14 ENCOUNTER — Ambulatory Visit
Admission: RE | Admit: 2018-03-14 | Discharge: 2018-03-14 | Disposition: A | Discharge status: Home / Self Care | Payer: 59 | Source: Ambulatory Visit | Attending: Radiation Oncology | Admitting: Radiation Oncology

## 2018-03-14 ENCOUNTER — Ambulatory Visit: Payer: 59 | Admitting: Gastroenterology

## 2018-03-14 VITALS — BP 112/72 | HR 68 | Ht 63.0 in | Wt 168.4 lb

## 2018-03-14 DIAGNOSIS — K589 Irritable bowel syndrome without diarrhea: Secondary | ICD-10-CM | POA: Diagnosis not present

## 2018-03-14 DIAGNOSIS — C9000 Multiple myeloma not having achieved remission: Secondary | ICD-10-CM | POA: Diagnosis not present

## 2018-03-14 DIAGNOSIS — Z510 Encounter for antineoplastic radiation therapy: Secondary | ICD-10-CM | POA: Insufficient documentation

## 2018-03-14 DIAGNOSIS — C9002 Multiple myeloma in relapse: Secondary | ICD-10-CM

## 2018-03-14 LAB — UPEP/UIFE/LIGHT CHAINS/TP, 24-HR UR
% BETA, Urine: 0 %
ALPHA 1 URINE: 0 %
ALPHA 2 UR: 0 %
Albumin, U: 100 %
FREE KAPPA LT CHAINS, UR: 4.86 mg/L (ref 1.35–24.19)
FREE KAPPA/LAMBDA RATIO: 21.13 — AB (ref 2.04–10.37)
Free Lambda Lt Chains,Ur: 0.23 mg/L — ABNORMAL LOW (ref 0.24–6.66)
GAMMA GLOBULIN URINE: 0 %
Total Protein, Urine-Ur/day: <86 mg/24 hr (ref 30–150)
Total Volume: 2150

## 2018-03-14 NOTE — Progress Notes (Addendum)
03/14/2018 Barbara Cook [id number] [birth year]   HISTORY OF PRESENT ILLNESS:  This is a pleasant 62 year old female who is known to Dr. Hilarie Fredrickson.  She has history of GERD, IBS, and multiple myeloma for which she is still on treatment.  She is here today with complaints of recent generalized abdominal discomfort and green, mushy stools.  Dr. Marin Olp placed her on Flagyl for possible diverticulitis.  She takes her last doses of this medication today.  Says that it has really helped.  Her stools are about back to normal now and her abdominal discomfort as improved.  She is also asking what she can do to manage IBS flares when she is traveling, etc.  No black or bloody stools.  No abdominal pain.    EGD in August 2017 showed some moderate inflammation with erosions and erythema in the stomach. Biopsies were unremarkable. Her colonoscopy at that same time was normal. CT scan of the abdomen with contrast in 01/2018 showed no GI issues.  Past Medical History:  Diagnosis Date  . Anemia   . Arthritis   . Arthropathy, unspecified, site unspecified   . Complication of anesthesia    02/13/14- had biospy in X-Ray- "twilight"  "I felt every thing"  . Constipation   . Endometriosis   . Family history of adverse reaction to anesthesia    Sister- patient will find out  . Fibroid   . GERD (gastroesophageal reflux disease)   . Heart murmur   . IBS (irritable bowel syndrome)   . Iron deficiency anemia due to chronic blood loss 08/15/2017  . Malabsorption of iron 08/15/2017  . MGUS (monoclonal gammopathy of unknown significance) Jul 21, 202013  . Multiple myeloma ([place]) 02/18/2014  . Myelitis ([place]) 2011   of bone- neck  . Neuromuscular disorder ([place])   . Polymyalgia rheumatica ([place])   . PONV (postoperative nausea and vomiting)    1990   Past Surgical History:  Procedure Laterality Date  . COLONOSCOPY    . Fistula repair surg    . IR GENERIC HISTORICAL  04/14/2016   IR FLUORO GUIDE PORT INSERTION RIGHT  04/14/2016 Greggory Keen, MD [facility]  . IR GENERIC HISTORICAL  04/14/2016   IR US GUIDE VASC ACCESS RIGHT 04/14/2016 Greggory Keen, MD [facility]  . KNEE ARTHROSCOPY Left   . NECK SURGERY  2011   Myletis- bone grafting- from her left hip  . RADIOLOGY WITH ANESTHESIA N/A 08/23/2014   Procedure: T12  ABLATION       (RADIOLOGY WITH ANESTHESIA);  Surgeon: Luanne Bras, MD;  Location: [facility];  Service: Radiology;  Laterality: N/A;  . TUBAL LIGATION    . UMBILICAL HERNIA REPAIR     age 16    reports that she quit smoking about 24 years ago. Her smoking use included cigarettes. She started smoking about 29 years ago. She has a 125.00 pack-year smoking history. She has never used smokeless tobacco. She reports that she does not drink alcohol or use drugs. family history includes Diabetes in her maternal grandmother; Heart disease in her maternal grandmother, mother, and paternal grandmother; Hypertension in her maternal grandmother; Lung cancer in her maternal uncle; Rheum arthritis in her mother; Throat cancer in her maternal aunt. Allergies  Allergen Reactions  . Barium-Containing Compounds Diarrhea    Oral Redicat gives pt severe diarrhea, causing delay in scan time, see previous ct scan notes for details  . Codeine Palpitations      Outpatient Encounter Medications as of  03/14/2018  Medication Sig  . acetaminophen (TYLENOL) 500 MG tablet Take 500 mg by mouth every 6 (six) hours as needed for mild pain or headache.  Marland Kitchen acyclovir (ZOVIRAX) 400 MG tablet Take 1 tablet (400 mg total) by mouth daily.  . hyoscyamine (LEVSIN SL) 0.125 MG SL tablet PLACE 1 TABLET (0.125 MG TOTAL) UNDER THE TONGUE EVERY 4 (FOUR) HOURS AS NEEDED.  . magnesium oxide (MAG-OX) 400 (241.3 Mg) MG tablet TAKE 1 TABLET BY MOUTH TWICE A DAY  . metroNIDAZOLE (FLAGYL) 500 MG tablet Take 1 tablet (500 mg total) by mouth 3 (three) times daily.  . Multiple Vitamin (MULTIVITAMIN) tablet Take 1 tablet by mouth every evening.     . ondansetron (ZOFRAN) 8 MG tablet TAKE 1 TABLET BY MOUTH TWICE A DAY FOR NAUSEA AND VOMITING 1 TABLET 1 HOUR PRIOR TO CHEMOTHERAPY  . pantoprazole (PROTONIX) 40 MG tablet Take 1 tablet (40 mg total) by mouth daily.  . polyethylene glycol powder (GLYCOLAX/MIRALAX) powder 1 capful daily as needed  . prochlorperazine (COMPAZINE) 10 MG tablet Take 1 tablet (10 mg total) by mouth every 6 (six) hours as needed for nausea or vomiting.  . Pyridoxine HCl (B-6) 250 MG TABS TAKE 1 TABLET (250 MG TOTAL) BY MOUTH DAILY.  Marland Kitchen torsemide (DEMADEX) 20 MG tablet Take 1 tablet (20 mg total) by mouth daily as needed.  . Vitamin D, Ergocalciferol, (DRISDOL) 50000 units CAPS capsule TAKE 1 CAPSULE (50,000 UNITS TOTAL) BY MOUTH ONCE A WEEK.  . [DISCONTINUED] traMADol (ULTRAM) 50 MG tablet Take 1 tablet (50 mg total) every 6 (six) hours as needed by mouth.   Facility-Administered Encounter Medications as of 03/14/2018  Medication  . 0.9 %  sodium chloride infusion  . sodium chloride flush (NS) 0.9 % injection 10 mL  . sodium chloride flush (NS) 0.9 % injection 10 mL     REVIEW OF SYSTEMS  : All other systems reviewed and negative except where noted in the History of Present Illness.   PHYSICAL EXAM: BP 112/72   Pulse 68   Ht 5' 3"  (1.6 m)   Wt 168 lb 6 oz (76.4 kg)   BMI 29.83 kg/m  General: Well developed black female in no acute distress Head: Normocephalic and atraumatic Eyes:  Sclerae anicteric, conjunctiva pink. Ears: Normal auditory acuity Lungs: Clear throughout to auscultation; no increased WOB. Heart: Regular rate and rhythm; no M/R/G. Abdomen: Soft, non-distended.  BS present.  Non-tender. Musculoskeletal: Symmetrical with no gross deformities  Skin: No lesions on visible extremities Extremities: No edema  Neurological: Alert oriented x 4, grossly non-focal Psychological:  Alert and cooperative. Normal mood and affect  ASSESSMENT AND PLAN: *62 year old female with IBS who recently had  complaints of generalized abdominal discomfort and mushy stools/green stools.  Was treated with Flagyl by oncologist for possible diverticulitis and had significant improvement in her symptoms.  Her last day Flagyl was today.  I do not think she had diverticulitis that she does not have any history of documented diverticulosis.  This could have been some mild infectious issue versus even IBS as sometimes we use Flagyl for SIBO/IBS flares.  Nonetheless, she is feeling much better.  I recommended her starting a daily probiotic such as Florastor or VSL #3 for at least a month following her antibiotic usage, but could continue long-term if she desires.  Will use Imodium as needed for occasional loose/mushy stools with IBS flares.  Could try Lomotil in the future if Imodium not helpfule.  CC:  [name], MD  Addendum: Reviewed and agree with assessment and management plan. Pyrtle, Lajuan Lines, MD

## 2018-03-14 NOTE — Patient Instructions (Signed)
If you are age 63 or older, your body mass index should be between 23-30. Your Body mass index is 29.83 kg/m. If this is out of the aforementioned range listed, please consider follow up with your Primary Care Provider.  If you are age 72 or younger, your body mass index should be between 19-25. Your Body mass index is 29.83 kg/m. If this is out of the aformentioned range listed, please consider follow up with your Primary Care Provider.   Use Imodium as needed.  Start Probiotic such as Florastor or VSL #3 at least for one month.  Follow up as needed.  Thank you for choosing me and [facility].  Alonza Bogus, PA-C

## 2018-03-15 ENCOUNTER — Inpatient Hospital Stay: Payer: 59 | Attending: Hematology & Oncology | Admitting: Hematology & Oncology

## 2018-03-15 ENCOUNTER — Inpatient Hospital Stay: Payer: 59

## 2018-03-15 DIAGNOSIS — C9000 Multiple myeloma not having achieved remission: Secondary | ICD-10-CM | POA: Diagnosis not present

## 2018-03-15 DIAGNOSIS — C9002 Multiple myeloma in relapse: Secondary | ICD-10-CM

## 2018-03-15 DIAGNOSIS — Z79899 Other long term (current) drug therapy: Secondary | ICD-10-CM | POA: Diagnosis not present

## 2018-03-15 DIAGNOSIS — K589 Irritable bowel syndrome without diarrhea: Secondary | ICD-10-CM | POA: Diagnosis not present

## 2018-03-15 MED ORDER — MONTELUKAST SODIUM 10 MG PO TABS: 10.0000 mg | ORAL_TABLET | Freq: Every day | ORAL | 5 refills | Status: DC

## 2018-03-15 NOTE — Progress Notes (Signed)
Hematology and Oncology Follow Up Visit  Barbara Cook [id number] May 27, 1955 62 y.o. 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  Unfortunately, he clearly looks like we now have progressive disease.  The M spike when we last saw her was up to 1.1 g/dL.  The IgG level was 1765 mg/dL.  Her Kappa light chain was 2.2 mg/dL.  We are going to have to make a change in treatment.  It has been difficult to try to keep her on schedule.  She has other health issues that she says have prevent her from getting treated on schedule.  The biggest issue has been irritable bowel syndrome.  She is yet to start radiation therapy for the plasmacytoma on the rib.  She says this will start next week.  I think our best option would be to consider elotuzimab.  I know that this is used in combination with pomalidomide.  I will ultimately add pomalidomide but I am not sure that she would be amenable to having a combination of treatment.  As much as we have wanted to be aggressive, Barbara Cook this is not allowed [place] to be aggressive.  She never would consent to a autologous bone marrow transplant.  She has had delays in her treatment because of other issues that she is had to deal with.  We actually have done quite well given the outside problems with her treatment schedules.  I am impressed  that she has done this well so far.  We have been treating her I think for at least 3 or 4 years.  Ultimately, we might be looking at CAR-T therapy.  Again, I doubt that she would ever agree to a autologous bone marrow transplant.  I know that she had been on Darzalex in the past.  She really did not have much Darzalex before we saw some progression.  I would like to try to avoid chemotherapy for now.  I just think that she has a tough time with chemotherapy.  I do not see any problem with doing the elotuzimab along with the radiation.  Overall, I said her performance status is ECOG 1.    Medications:  Allergies as of 03/15/2018      Reactions   Barium-containing Compounds Diarrhea   Oral Redicat gives pt severe diarrhea, causing delay in scan time, see previous ct scan notes for details   Codeine Palpitations      Medication List        Accurate as of 03/15/18  5:11 PM. Always use your most recent med list.          acetaminophen 500 MG tablet Commonly known as:  TYLENOL Take 500 mg by mouth every 6 (six) hours as needed for mild pain or headache.   acyclovir 400 MG tablet Commonly known as:  ZOVIRAX Take 1 tablet (400 mg total) by mouth daily.  B-6 250 MG Tabs TAKE 1 TABLET (250 MG TOTAL) BY MOUTH DAILY.   hyoscyamine 0.125 MG SL tablet Commonly known as:  LEVSIN SL PLACE 1 TABLET (0.125 MG TOTAL) UNDER THE TONGUE EVERY 4 (FOUR) HOURS AS NEEDED.   magnesium oxide 400 (241.3 Mg) MG tablet Commonly known as:  MAG-OX TAKE 1 TABLET BY MOUTH TWICE A DAY   metroNIDAZOLE 500 MG tablet Commonly known as:  FLAGYL Take 1 tablet (500 mg total) by mouth 3 (three) times daily.   montelukast 10 MG tablet Commonly known as:  SINGULAIR Take 1 tablet (10 mg total) by mouth at bedtime.   multivitamin tablet Take 1 tablet by mouth every evening.   ondansetron 8 MG tablet Commonly known as:  ZOFRAN TAKE 1 TABLET BY MOUTH TWICE A DAY FOR NAUSEA AND VOMITING 1 TABLET 1 HOUR  PRIOR TO CHEMOTHERAPY   pantoprazole 40 MG tablet Commonly known as:  PROTONIX Take 1 tablet (40 mg total) by mouth daily.   polyethylene glycol powder powder Commonly known as:  GLYCOLAX/MIRALAX 1 capful daily as needed   prochlorperazine 10 MG tablet Commonly known as:  COMPAZINE Take 1 tablet (10 mg total) by mouth every 6 (six) hours as needed for nausea or vomiting.   torsemide 20 MG tablet Commonly known as:  DEMADEX Take 1 tablet (20 mg total) by mouth daily as needed.   Vitamin D (Ergocalciferol) 1.25 MG (50000 UT) Caps capsule Commonly known as:  DRISDOL TAKE 1 CAPSULE (50,000 UNITS TOTAL) BY MOUTH ONCE A WEEK.       Allergies:  Allergies  Allergen Reactions  . Barium-Containing Compounds Diarrhea    Oral Redicat gives pt severe diarrhea, causing delay in scan time, see previous ct scan notes for details  . Codeine Palpitations    Past Medical History, Surgical history, Social history, and Family History were reviewed and updated.  Review of Systems: Review of Systems  Constitutional: Negative.   HENT: Negative.   Eyes: Negative.   Respiratory: Negative.   Cardiovascular: Negative.   Gastrointestinal: Negative.   Genitourinary: Negative.   Musculoskeletal: Positive for myalgias.  Skin: Negative.   Neurological: Negative.   Endo/Heme/Allergies: Negative.   Psychiatric/Behavioral: Negative.    Marland Kitchen   Physical Exam:  vitals were not taken for this visit.   Wt Readings from Last 3 Encounters:  03/14/18 168 lb 6 oz (76.4 kg)  03/06/18 166 lb (75.3 kg)  02/06/18 172 lb (78 kg)    Physical Exam  Constitutional: She is oriented to person, place, and time.  HENT:  Head: Normocephalic and atraumatic.  Mouth/Throat: Oropharynx is clear and moist.  Eyes: Pupils are equal, round, and reactive to light. EOM are normal.  Neck: Normal range of motion.  Cardiovascular: Normal rate, regular rhythm and normal heart sounds.  Pulmonary/Chest: Effort normal and  breath sounds normal.  Abdominal: Soft. Bowel sounds are normal.  Musculoskeletal: Normal range of motion. She exhibits no edema, tenderness or deformity.  Lymphadenopathy:    She has no cervical adenopathy.  Neurological: She is alert and oriented to person, place, and time.  Skin: Skin is warm and dry. No rash noted. No erythema.  Psychiatric: She has a normal mood and affect. Her behavior is normal. Judgment and thought content normal.  Vitals reviewed.    Lab Results  Component Value Date   WBC 1.9 (L) 03/06/2018   HGB 10.7 (L) 03/06/2018   HCT 33.5 (L) 03/06/2018   MCV 92.3 03/06/2018   PLT 108 (L)  03/06/2018   Lab Results  Component Value Date   FERRITIN 435 (H) 03/06/2018   IRON 86 03/06/2018   TIBC 255 03/06/2018   UIBC 169 03/06/2018   IRONPCTSAT 34 03/06/2018   Lab Results  Component Value Date   RETICCTPCT 1.5 12/12/2017   RBC 3.63 (L) 03/06/2018   RETICCTABS 35.9 12/25/2014   Lab Results  Component Value Date   KPAFRELGTCHN 21.9 (H) 03/06/2018   LAMBDASER 6.9 03/06/2018   KAPLAMBRATIO 21.13 (H) 03/13/2018   Lab Results  Component Value Date   IGGSERUM 1,665 (H) 03/06/2018   IGGSERUM 1,765 (H) 03/06/2018   IGA 29 (L) 03/06/2018   IGA 31 (L) 03/06/2018   IGMSERUM 7 (L) 03/06/2018   IGMSERUM 8 (L) 03/06/2018   Lab Results  Component Value Date   TOTALPROTELP 6.8 03/06/2018   ALBUMINELP 3.8 03/06/2018   A1GS 0.2 03/06/2018   A2GS 0.5 03/06/2018   BETS 0.8 03/06/2018   BETA2SER 0.3 05/07/2015   GAMS 1.5 03/06/2018   MSPIKE 1.1 (H) 03/06/2018   SPEI Comment 02/06/2018     Chemistry      Component Value Date/Time   NA 140 03/06/2018 0850   NA 143 05/09/2017 0812   NA 139 02/20/2016 1207   K 3.6 03/06/2018 0850   K 4.0 05/09/2017 0812   K 3.8 02/20/2016 1207   CL 112 (H) 03/06/2018 0850   CL 108 05/09/2017 0812   CO2 26 03/06/2018 0850   CO2 26 05/09/2017 0812   CO2 29 02/20/2016 1207   BUN 13 03/06/2018 0850   BUN 13 05/09/2017 0812    BUN 22.3 02/20/2016 1207   CREATININE 1.10 03/06/2018 0850   CREATININE 0.9 05/09/2017 0812   CREATININE 1.3 (H) 02/20/2016 1207      Component Value Date/Time   CALCIUM 9.7 03/06/2018 0850   CALCIUM 8.3 05/09/2017 0812   CALCIUM 9.7 02/20/2016 1207   ALKPHOS 41 03/06/2018 0850   ALKPHOS 123 (H) 05/09/2017 0812   ALKPHOS 62 02/20/2016 1207   AST 31 03/06/2018 0850   AST 27 02/20/2016 1207   ALT 25 03/06/2018 0850   ALT 26 05/09/2017 0812   ALT 19 02/20/2016 1207   BILITOT 0.8 03/06/2018 0850   BILITOT 0.44 02/20/2016 1207      Impression and Plan: Barbara Cook is a very pleasant 62 yo African American female with IgG kappa myeloma.   We now have to make another treatment changed due to her M spike increasing.  It still is not that much of an increase and that she still does not have a significant myeloma burden.  I think at some point, we are going to have to get a bone marrow biopsy on her.  I know that she has been reluctant to do this.  However, we are at a point where I think seeing her cytogenetics will help [place] out.  We will see how she does on the elotuzimab.  I will then try to add pomalidomide.  Again I think this might be difficult to incorporate.  She probably is going to need a very low dose of pomalidomide (1 or 2 mg).  I spent about 40 minutes with her today.  She is with her husband.  They are family to me.  I really love both of them.  It is been very enjoyable just having fellowship with them.  We will see about getting started next week.  I told her that we are going to have to be patient  and see how the elotuzimab works.    [name], MD 11/6/20195:11 PM

## 2018-03-15 NOTE — Progress Notes (Signed)
NO labs or treatment today per MD Ennever due to lab results on 11/4. Patient and spouse were made aware of further treatment options by MD today and were given information about Empliciti and will begin radiation for a few weeks per MD Kinard yesterday. Patient was told to call the clinic with any questions regarding new treatment care plan.

## 2018-03-15 NOTE — Progress Notes (Addendum)
  Radiation Oncology         [phone] ________________________________  Name: Barbara Cook MRN: [id number]  Date: 03/14/2018  DOB: [birth year]  SIMULATION AND TREATMENT PLANNING NOTE    ICD-10-CM   1. Multiple myeloma not having achieved remission (HCC) C90.00     DIAGNOSIS:  Multiple myeloma  NARRATIVE:  The patient was brought to the [facility].  Identity was confirmed.  All relevant records and images related to the planned course of therapy were reviewed.  The patient freely provided informed written consent to proceed with treatment after reviewing the details related to the planned course of therapy. The consent form was witnessed and verified by the simulation staff.  Then, the patient was set-up in a stable reproducible  supine position for radiation therapy.  CT images were obtained.  Surface markings were placed.  The CT images were loaded into the planning software.  Then the target and avoidance structures were contoured.  Treatment planning then occurred.  The radiation prescription was entered and confirmed.  Then, I designed and supervised the construction of a total of 5 medically necessary complex treatment devices.  I have requested : 3D Simulation  I have requested a DVH of the following structures: GTV, PTV, heart, lungs, small bowel  I have ordered:dose calc.  PLAN:  The patient will receive 30 Gy in 15 fractions.  Addendum: Patient delayed initiation of her radiation treatment. In the interim her tumor mass in the left lower chest has increased significantly in size. It encroaches on bowel in the left upper quadrant and therefore we will need to fractionate her radiation therapy over 3 weeks.  -----------------------------------  Blair Promise, PhD, MD

## 2018-03-16 ENCOUNTER — Inpatient Hospital Stay: Payer: 59

## 2018-03-17 ENCOUNTER — Telehealth: Payer: Self-pay | Admitting: *Deleted

## 2018-03-17 NOTE — Telephone Encounter (Signed)
Returned patient's phone call regarding her schedule. Verified all appointments. She verbalized understanding.

## 2018-03-18 ENCOUNTER — Other Ambulatory Visit: Payer: Self-pay | Admitting: Gastroenterology

## 2018-03-18 DIAGNOSIS — K2970 Gastritis, unspecified, without bleeding: Secondary | ICD-10-CM

## 2018-03-18 DIAGNOSIS — K2990 Gastroduodenitis, unspecified, without bleeding: Principal | ICD-10-CM

## 2018-03-20 ENCOUNTER — Ambulatory Visit: Payer: 59 | Admitting: Radiation Oncology

## 2018-03-20 DIAGNOSIS — C9000 Multiple myeloma not having achieved remission: Secondary | ICD-10-CM | POA: Diagnosis not present

## 2018-03-20 DIAGNOSIS — Z510 Encounter for antineoplastic radiation therapy: Secondary | ICD-10-CM | POA: Diagnosis not present

## 2018-03-21 ENCOUNTER — Ambulatory Visit
Admission: RE | Admit: 2018-03-21 | Discharge: 2018-03-21 | Disposition: A | Discharge status: Home / Self Care | Payer: 59 | Source: Ambulatory Visit | Attending: Radiation Oncology | Admitting: Radiation Oncology

## 2018-03-21 ENCOUNTER — Ambulatory Visit: Payer: 59

## 2018-03-21 DIAGNOSIS — C9002 Multiple myeloma in relapse: Secondary | ICD-10-CM

## 2018-03-21 DIAGNOSIS — C9000 Multiple myeloma not having achieved remission: Secondary | ICD-10-CM | POA: Diagnosis not present

## 2018-03-21 DIAGNOSIS — Z510 Encounter for antineoplastic radiation therapy: Secondary | ICD-10-CM | POA: Diagnosis not present

## 2018-03-21 MED ORDER — SONAFINE EX EMUL
1.0000 "application " | Freq: Two times a day (BID) | CUTANEOUS | Status: DC
  Administered 2018-03-21: 1 via TOPICAL

## 2018-03-21 NOTE — Progress Notes (Signed)
  Radiation Oncology         [phone] ________________________________  Name: Barbara Cook MRN: [id number]  Date: 03/21/2018  DOB: 11/23/1955  Simulation Verification Note    ICD-10-CM   1. Multiple myeloma in relapse ([facility]) C90.02   2. Multiple myeloma not having achieved remission (HCC) C90.00 SONAFINE emulsion 1 application    Status: outpatient  NARRATIVE: The patient was brought to the treatment unit and placed in the planned treatment position. The clinical setup was verified. Then port films were obtained and uploaded to the radiation oncology medical record software.  The treatment beams were carefully compared against the planned radiation fields. The position location and shape of the radiation fields was reviewed. They targeted volume of tissue appears to be appropriately covered by the radiation beams. Organs at risk appear to be excluded as planned.  Based on my personal review, I approved the simulation verification. The patient's treatment will proceed as planned.  -----------------------------------  Blair Promise, PhD, MD

## 2018-03-22 ENCOUNTER — Encounter (HOSPITAL_COMMUNITY): Payer: Self-pay

## 2018-03-22 ENCOUNTER — Emergency Department (HOSPITAL_COMMUNITY): Payer: 59

## 2018-03-22 ENCOUNTER — Emergency Department (HOSPITAL_COMMUNITY)
Admission: EM | Admit: 2018-03-22 | Discharge: 2018-03-22 | Disposition: A | Discharge status: Home / Self Care | Payer: 59 | Attending: Emergency Medicine | Admitting: Emergency Medicine

## 2018-03-22 ENCOUNTER — Ambulatory Visit: Payer: 59

## 2018-03-22 ENCOUNTER — Other Ambulatory Visit: Payer: Self-pay

## 2018-03-22 DIAGNOSIS — C9032 Solitary plasmacytoma in relapse: Secondary | ICD-10-CM | POA: Insufficient documentation

## 2018-03-22 DIAGNOSIS — R0789 Other chest pain: Secondary | ICD-10-CM | POA: Insufficient documentation

## 2018-03-22 DIAGNOSIS — Z87891 Personal history of nicotine dependence: Secondary | ICD-10-CM | POA: Insufficient documentation

## 2018-03-22 DIAGNOSIS — Z79899 Other long term (current) drug therapy: Secondary | ICD-10-CM | POA: Insufficient documentation

## 2018-03-22 DIAGNOSIS — C9000 Multiple myeloma not having achieved remission: Secondary | ICD-10-CM | POA: Insufficient documentation

## 2018-03-22 DIAGNOSIS — M542 Cervicalgia: Secondary | ICD-10-CM | POA: Insufficient documentation

## 2018-03-22 DIAGNOSIS — R0602 Shortness of breath: Secondary | ICD-10-CM | POA: Diagnosis not present

## 2018-03-22 LAB — CBC WITH DIFFERENTIAL/PLATELET
Abs Immature Granulocytes: 0.01 10*3/uL (ref 0.00–0.07)
BASOS ABS: 0 10*3/uL (ref 0.0–0.1)
Basophils Relative: 1 %
EOS ABS: 0 10*3/uL (ref 0.0–0.5)
Eosinophils Relative: 1 %
HEMATOCRIT: 33.5 % — AB (ref 36.0–46.0)
HEMOGLOBIN: 10.7 g/dL — AB (ref 12.0–15.0)
IMMATURE GRANULOCYTES: 0 %
LYMPHS ABS: 0.5 10*3/uL — AB (ref 0.7–4.0)
LYMPHS PCT: 16 %
MCH: 30.7 pg (ref 26.0–34.0)
MCHC: 31.9 g/dL (ref 30.0–36.0)
MCV: 96 fL (ref 80.0–100.0)
Monocytes Absolute: 0.4 10*3/uL (ref 0.1–1.0)
Monocytes Relative: 12 %
NEUTROS PCT: 70 %
Neutro Abs: 2.2 10*3/uL (ref 1.7–7.7)
Platelets: 108 10*3/uL — ABNORMAL LOW (ref 150–400)
RBC: 3.49 MIL/uL — ABNORMAL LOW (ref 3.87–5.11)
RDW: 13.6 % (ref 11.5–15.5)
WBC: 3.2 10*3/uL — ABNORMAL LOW (ref 4.0–10.5)
nRBC: 0 % (ref 0.0–0.2)

## 2018-03-22 LAB — URINALYSIS, ROUTINE W REFLEX MICROSCOPIC
BILIRUBIN URINE: NEGATIVE
GLUCOSE, UA: NEGATIVE mg/dL
Ketones, ur: NEGATIVE mg/dL
NITRITE: NEGATIVE
PH: 5 (ref 5.0–8.0)
Protein, ur: NEGATIVE mg/dL
SPECIFIC GRAVITY, URINE: 1.017 (ref 1.005–1.030)

## 2018-03-22 LAB — BASIC METABOLIC PANEL
ANION GAP: 8 (ref 5–15)
BUN: 20 mg/dL (ref 8–23)
CHLORIDE: 106 mmol/L (ref 98–111)
CO2: 25 mmol/L (ref 22–32)
Calcium: 9.2 mg/dL (ref 8.9–10.3)
Creatinine, Ser: 0.9 mg/dL (ref 0.44–1.00)
GFR calc non Af Amer: >60 mL/min (ref >60)
Glucose, Bld: 93 mg/dL (ref 70–99)
Potassium: 3.8 mmol/L (ref 3.5–5.1)
Sodium: 139 mmol/L (ref 135–145)

## 2018-03-22 LAB — D-DIMER, QUANTITATIVE (NOT AT ARMC): D DIMER QUANT: 0.64 ug{FEU}/mL — AB (ref 0.00–0.50)

## 2018-03-22 MED ORDER — HYDROCODONE-ACETAMINOPHEN 5-325 MG PO TABS: 1.0000 | ORAL_TABLET | ORAL | 0 refills | Status: AC | PRN

## 2018-03-22 MED ORDER — HEPARIN SOD (PORK) LOCK FLUSH 100 UNIT/ML IV SOLN
500.0000 [IU] | Freq: Once | INTRAVENOUS | Status: AC | PRN
  Administered 2018-03-22: 500 [IU]
  Filled 2018-03-22: qty 5

## 2018-03-22 MED ORDER — IOPAMIDOL (ISOVUE-370) INJECTION 76%
INTRAVENOUS | Status: AC
  Filled 2018-03-22: qty 100

## 2018-03-22 MED ORDER — SODIUM CHLORIDE 0.9 % IV SOLN
Freq: Once | INTRAVENOUS | Status: AC
  Administered 2018-03-22: 20 mL/h via INTRAVENOUS

## 2018-03-22 MED ORDER — IOPAMIDOL (ISOVUE-370) INJECTION 76%
100.0000 mL | Freq: Once | INTRAVENOUS | Status: AC | PRN
  Administered 2018-03-22: 57 mL via INTRAVENOUS

## 2018-03-22 NOTE — ED Notes (Signed)
Patient returned form Xray.  

## 2018-03-22 NOTE — ED Triage Notes (Signed)
Patient arrived via POV, Patient is AOx4 and ambulatory. Patient is complaining of upper left chest pain / SOB that is causing patient to be unable to take deep breath. Symptoms started around 0600 this morning.

## 2018-03-22 NOTE — ED Provider Notes (Signed)
[facility] DEPT Provider Note   CSN: [id number] Arrival date & time: 03/22/18  0762     History   Chief Complaint Chief Complaint  Patient presents with  . Shortness of Breath    HPI Barbara Cook is a 62 y.o. female.  Pt presents to the ED today with pain with deep inspiration and left neck pain.  The pt said she noticed it around 0600.  The pt denies f/c.  The pt denies n/v/d.  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     Past Medical History:  Diagnosis Date  . Anemia   . Arthritis   . Arthropathy, unspecified, site unspecified   . Complication of anesthesia    02/13/14- had biospy in X-Ray- "twilight"  "I felt every thing"  . Constipation   . Endometriosis   . Family history of adverse reaction to anesthesia    Sister- patient will find out  . Fibroid   . GERD (gastroesophageal reflux disease)   . Heart murmur   . IBS (irritable bowel syndrome)   . Iron deficiency anemia due to chronic blood loss 08/15/2017  . Malabsorption of iron 08/15/2017  . MGUS (monoclonal gammopathy of unknown significance) 01-13-2012  . Multiple myeloma ([place]) 02/18/2014  . Myelitis ([place]) 2011   of bone- neck  . Neuromuscular disorder ([place])   . Polymyalgia rheumatica ([place])   . PONV (postoperative nausea and vomiting)    1990    Patient Active Problem List   Diagnosis Date Noted  . Hearing loss of left ear 01/24/2018  . Impacted cerumen of left ear 01/24/2018  . Left ear pain 01/24/2018  . RUQ abdominal pain 10/12/2017  . Abdominal pain, epigastric 10/12/2017  . Iron deficiency anemia due to chronic blood loss 08/15/2017  . Malabsorption of iron 08/15/2017  . Hypercalcemia 03/18/2017  . Hypercalcemia of malignancy 03/18/2017  . Normocytic anemia 03/18/2017  . Acute kidney injury ([place]) 03/18/2017  . Multiple myeloma not having achieved remission ([place]) 02/17/2017  .  Gastritis and gastroduodenitis 11/04/2016  . Bloating 11/04/2016  . Dyspepsia 11/04/2016  . Lytic bone lesions on xray 03/05/2016  . Deviated septum 12/31/2015  . Epistaxis, recurrent 12/31/2015  . Otorrhagia of left ear 12/31/2015  . Seasonal allergic rhinitis 12/31/2015  . Multiple myeloma ([place]) 02/18/2014  . Abnormal CXR 01/11/2014  . Abnormality of gait 02/16/2013  . Polymyalgia rheumatica ([place])   . GERD (gastroesophageal reflux disease)   . Arthritis   . Irritable bowel syndrome 10/18/2011  . Altered bowel function 08/30/2011  . Abdominal pain, left upper quadrant 11/10/2010  . ARTHRITIS 04/09/2008    Past Surgical History:  Procedure Laterality Date  . COLONOSCOPY    . Fistula repair surg    . IR GENERIC HISTORICAL  04/14/2016   IR FLUORO GUIDE PORT INSERTION RIGHT 04/14/2016 Greggory Keen, MD [facility]INTER[facility] RAD  . IR GENERIC HISTORICAL  04/14/2016   IR US GUIDE VASC ACCESS RIGHT 04/14/2016 Greggory Keen, MD [facility]  . KNEE ARTHROSCOPY Left   . NECK SURGERY  2011   Myletis- bone grafting- from her left hip  . RADIOLOGY WITH ANESTHESIA N/A 08/23/2014   Procedure: T12  ABLATION       (RADIOLOGY WITH ANESTHESIA);  Surgeon: Luanne Bras, MD;  Location: [facility];  Service: Radiology;  Laterality: N/A;  . TUBAL LIGATION    . UMBILICAL HERNIA REPAIR  age 1     OB History    Gravida  1   Para  1   Term  1   Preterm      AB      Living  1     SAB      TAB      Ectopic      Multiple      Live Births               Home Medications    Prior to Admission medications   Medication Sig Start Date End Date Taking? Authorizing Provider  acetaminophen (TYLENOL) 500 MG tablet Take 500 mg by mouth every 6 (six) hours as needed for mild pain or headache.   Yes [provider]  hyoscyamine (LEVSIN SL) 0.125 MG SL tablet PLACE 1 TABLET (0.125 MG TOTAL) UNDER THE TONGUE EVERY 4 (FOUR) HOURS AS NEEDED. Patient taking differently: Place 0.125 mg under  the tongue every 4 (four) hours as needed (IBS).  08/29/17  Yes Pyrtle, Lajuan Lines, MD  magnesium oxide (MAG-OX) 400 (241.3 Mg) MG tablet TAKE 1 TABLET BY MOUTH TWICE A DAY Patient taking differently: Take 400 mg by mouth daily.  06/22/16  Yes [name], MD  Multiple Vitamin (MULTIVITAMIN) tablet Take 1 tablet by mouth every evening.    Yes [provider]  ondansetron (ZOFRAN) 8 MG tablet TAKE 1 TABLET BY MOUTH TWICE A DAY FOR NAUSEA AND VOMITING 1 TABLET 1 HOUR PRIOR TO CHEMOTHERAPY Patient taking differently: Take 8 mg by mouth 2 (two) times daily as needed for nausea or vomiting (chemotherapy).  08/15/17  Yes [name], MD  pantoprazole (PROTONIX) 40 MG tablet Take 1 tablet (40 mg total) by mouth daily. 03/20/18  Yes Zehr, Laban Emperor, PA-C  prochlorperazine (COMPAZINE) 10 MG tablet Take 1 tablet (10 mg total) by mouth every 6 (six) hours as needed for nausea or vomiting. 04/11/17  Yes Ennever, Rudell Cobb, MD  Pyridoxine HCl (B-6) 250 MG TABS TAKE 1 TABLET (250 MG TOTAL) BY MOUTH DAILY. Patient taking differently: Take 250 mg by mouth daily.  08/15/17  Yes [name], MD  torsemide (DEMADEX) 20 MG tablet Take 1 tablet (20 mg total) by mouth daily as needed. Patient taking differently: Take 20 mg by mouth daily as needed (fluid in ankles/swelling).  06/03/14  Yes [name], MD  acyclovir (ZOVIRAX) 400 MG tablet Take 1 tablet (400 mg total) by mouth daily. Patient not taking: Reported on 03/22/2018 04/11/17   [name], MD  HYDROcodone-acetaminophen (NORCO/VICODIN) 5-325 MG tablet Take 1 tablet by mouth every 4 (four) hours as needed. 03/22/18   Isla Pence, MD  metroNIDAZOLE (FLAGYL) 500 MG tablet Take 1 tablet (500 mg total) by mouth 3 (three) times daily. 03/06/18   [name], MD  montelukast (SINGULAIR) 10 MG tablet Take 1 tablet (10 mg total) by mouth at bedtime. Patient not taking: Reported on 03/22/2018 03/15/18   [name], MD  polyethylene glycol  powder [facility]: [facility] Rhonda W) powder 1 capful daily as needed Patient not taking: Reported on 03/22/2018 06/05/15   Mauri Pole, MD  Vitamin D, Ergocalciferol, (DRISDOL) 50000 units CAPS capsule TAKE 1 CAPSULE (50,000 UNITS TOTAL) BY MOUTH ONCE A WEEK. 03/03/18   [place], Holli Humbles, NP    Family History Family History  Problem Relation Age of Onset  . Heart disease Mother   . Rheum arthritis Mother   . Lung cancer Maternal Uncle   .  Throat cancer Maternal Aunt   . Diabetes Maternal Grandmother   . Hypertension Maternal Grandmother   . Heart disease Maternal Grandmother   . Heart disease Paternal Grandmother   . Colon cancer Neg Hx   . Esophageal cancer Neg Hx   . Stomach cancer Neg Hx   . Rectal cancer Neg Hx     Social History Social History   Tobacco Use  . Smoking status: Former Smoker    Packs/day: 25.00    Years: 5.00    Pack years: 125.00    Types: Cigarettes    Start date: 05/26/1988    Last attempt to quit: 01/11/1994    Years since quitting: 24.2  . Smokeless tobacco: Never Used  . Tobacco comment: QUIT SMOKING 20 YEARS AGO  Substance Use Topics  . Alcohol use: No    Alcohol/week: 0.0 standard drinks  . Drug use: No     Allergies   Barium-containing compounds and Codeine   Review of Systems Review of Systems  Respiratory: Positive for shortness of breath.   All other systems reviewed and are negative.    Physical Exam Updated Vital Signs BP 135/75   Pulse 80   Temp 98 F (36.7 C) (Oral)   Resp 19   Ht 5' 3"  (1.6 m)   Wt 76.2 kg   SpO2 99%   BMI 29.76 kg/m   Physical Exam  Constitutional: She is oriented to person, place, and time. She appears well-developed and well-nourished.  HENT:  Head: Normocephalic and atraumatic.  Mouth/Throat: Oropharynx is clear and moist.  Eyes: Pupils are equal, round, and reactive to light. EOM are normal.  Neck: Normal range of motion. Neck supple.  Cardiovascular: Normal rate and regular rhythm.    Pulmonary/Chest: Effort normal and breath sounds normal.  Abdominal: Soft. Bowel sounds are normal.  Musculoskeletal: Normal range of motion.       Right lower leg: Normal.       Left lower leg: Normal.  Neurological: She is alert and oriented to person, place, and time.  Skin: Skin is warm and dry. Capillary refill takes less than 2 seconds.  Psychiatric: She has a normal mood and affect. Her behavior is normal.  Nursing note and vitals reviewed.    ED Treatments / Results  Labs (all labs ordered are listed, but only abnormal results are displayed) Labs Reviewed  CBC WITH DIFFERENTIAL/PLATELET - Abnormal; Notable for the following components:      Result Value   WBC 3.2 (*)    RBC 3.49 (*)    Hemoglobin 10.7 (*)    HCT 33.5 (*)    Platelets 108 (*)    Lymphs Abs 0.5 (*)    All other components within normal limits  URINALYSIS, ROUTINE W REFLEX MICROSCOPIC - Abnormal; Notable for the following components:   Hgb urine dipstick SMALL (*)    Leukocytes, UA SMALL (*)    Bacteria, UA FEW (*)    All other components within normal limits  D-DIMER, QUANTITATIVE (NOT AT [facility]) - Abnormal; Notable for the following components:   D-Dimer, Quant 0.64 (*)    All other components within normal limits  BASIC METABOLIC PANEL    EKG None  Radiology Dg Chest 2 View  Result Date: 03/22/2018 CLINICAL DATA:  Shortness of Breath EXAM: CHEST - 2 VIEW COMPARISON:  05/19/2017 FINDINGS: Cardiac shadow is mildly enlarged but stable. Right chest wall port and changes of prior kyphoplasty are again seen. The lungs are well aerated bilaterally.  No focal infiltrate or sizable effusion is seen. Minimal basilar atelectasis is noted on the left in the lingula. Chronic compression deformity is noted in the mid to lower thoracic spine. Degenerative changes of the thoracic spine are noted. IMPRESSION: Minimal lingular atelectasis. Electronically Signed   By: Inez Catalina M.D.   On: 03/22/2018 09:14   Ct  Angio Chest Pe W And/or Wo Contrast  Result Date: 03/22/2018 CLINICAL DATA:  Shortness of breath and chest pain. History of multiple myeloma EXAM: CT ANGIOGRAPHY CHEST WITH CONTRAST TECHNIQUE: Multidetector CT imaging of the chest was performed using the standard protocol during bolus administration of intravenous contrast. Multiplanar CT image reconstructions and MIPs were obtained to evaluate the vascular anatomy. CONTRAST:  48m ISOVUE-370 IOPAMIDOL (ISOVUE-370) INJECTION 76% COMPARISON:  Chest radiograph March 22, 2018. CT abdomen including portions of the chest January 20, 2018 FINDINGS: Cardiovascular: There is no demonstrable pulmonary embolus. There is no thoracic aortic aneurysm or dissection. The visualized great vessels appear unremarkable. Note that the right innominate and left common carotid arteries arise as a common trunk, an anatomic variant. There is aortic atherosclerosis. Port-A-Cath tip is in the right atrium. There is no pericardial effusion or pericardial thickening. Mediastinum/Nodes: Thyroid appears unremarkable. There is adenopathy in the right paratracheal region measuring 2.6 x 2.2 cm. There is no other adenopathy appreciable. No esophageal lesions are evident. Lungs/Pleura: There is a small left pleural effusion. There is atelectatic change in the lung bases. Lungs elsewhere are clear. Upper Abdomen: Visualized upper abdominal structures appear unremarkable. Musculoskeletal: There is again noted an expansile lesion involving the anterior left sixth rib, a suspected plasmacytoma, also noted on prior CT abdomen examination. This expansile lesion measures 5.2 x 4.7 x 5.7 cm. This lesion is increased in size compared to CT obtained 2 months prior. There has been previous kyphoplasty procedure at T12. There is extensive lytic change in multiple vertebral bodies as well as in the sacrum and multiple ribs consistent with known multiple myeloma. Review of the MIP images confirms the  above findings. IMPRESSION: 1. No demonstrable pulmonary embolus. No thoracic aortic aneurysm or dissection. There is aortic atherosclerosis. 2. Widespread bony changes consistent with known multiple myeloma. Enlarging apparent plasmacytoma involving the left anterior sixth rib. 3. Right paratracheal adenopathy, likely secondary to known multiple myeloma. 4. Small left pleural effusion. Bibasilar atelectasis. No lung edema or consolidation. No parenchymal lung mass. Aortic Atherosclerosis (ICD10-I70.0). Electronically Signed   By: WLowella GripIII M.D.   On: 03/22/2018 12:31    Procedures Procedures (including critical care time)  Medications Ordered in ED Medications  heparin lock flush 100 unit/mL (has no administration in time range)  iopamidol (ISOVUE-370) 76 % injection (has no administration in time range)  0.9 %  sodium chloride infusion (20 mL/hr Intravenous New Bag/Given 03/22/18 1001)  iopamidol (ISOVUE-370) 76 % injection 100 mL (57 mLs Intravenous Contrast Given 03/22/18 1208)     Initial Impression / Assessment and Plan / ED Course  I have reviewed the triage vital signs and the nursing notes.  Pertinent labs & imaging results that were available during my care of the patient were reviewed by me and considered in my medical decision making (see chart for details).  Pt is feeling better.  Nothing new on CT chest and no PE.  Pt is stable for d/c.  Return if worse.  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

## 2018-03-22 NOTE — ED Notes (Signed)
Patient transported to X-ray 

## 2018-03-23 ENCOUNTER — Ambulatory Visit: Payer: 59

## 2018-03-23 ENCOUNTER — Ambulatory Visit
Admission: RE | Admit: 2018-03-23 | Discharge: 2018-03-23 | Disposition: A | Discharge status: Home / Self Care | Payer: 59 | Source: Ambulatory Visit | Attending: Radiation Oncology | Admitting: Radiation Oncology

## 2018-03-23 DIAGNOSIS — Z510 Encounter for antineoplastic radiation therapy: Secondary | ICD-10-CM | POA: Diagnosis not present

## 2018-03-23 DIAGNOSIS — C9000 Multiple myeloma not having achieved remission: Secondary | ICD-10-CM | POA: Diagnosis not present

## 2018-03-24 ENCOUNTER — Ambulatory Visit
Admission: RE | Admit: 2018-03-24 | Discharge: 2018-03-24 | Disposition: A | Discharge status: Home / Self Care | Payer: 59 | Source: Ambulatory Visit | Attending: Radiation Oncology | Admitting: Radiation Oncology

## 2018-03-24 ENCOUNTER — Ambulatory Visit: Payer: 59

## 2018-03-24 DIAGNOSIS — C9000 Multiple myeloma not having achieved remission: Secondary | ICD-10-CM | POA: Diagnosis not present

## 2018-03-24 DIAGNOSIS — Z510 Encounter for antineoplastic radiation therapy: Secondary | ICD-10-CM | POA: Diagnosis not present

## 2018-03-27 ENCOUNTER — Ambulatory Visit
Admission: RE | Admit: 2018-03-27 | Discharge: 2018-03-27 | Disposition: A | Discharge status: Home / Self Care | Payer: 59 | Source: Ambulatory Visit | Attending: Radiation Oncology | Admitting: Radiation Oncology

## 2018-03-27 ENCOUNTER — Ambulatory Visit: Payer: 59

## 2018-03-27 DIAGNOSIS — Z510 Encounter for antineoplastic radiation therapy: Secondary | ICD-10-CM | POA: Diagnosis not present

## 2018-03-27 DIAGNOSIS — C9000 Multiple myeloma not having achieved remission: Secondary | ICD-10-CM | POA: Diagnosis not present

## 2018-03-28 ENCOUNTER — Ambulatory Visit
Admission: RE | Admit: 2018-03-28 | Discharge: 2018-03-28 | Disposition: A | Discharge status: Home / Self Care | Payer: 59 | Source: Ambulatory Visit | Attending: Radiation Oncology | Admitting: Radiation Oncology

## 2018-03-28 ENCOUNTER — Ambulatory Visit: Payer: 59

## 2018-03-28 DIAGNOSIS — Z510 Encounter for antineoplastic radiation therapy: Secondary | ICD-10-CM | POA: Diagnosis not present

## 2018-03-28 DIAGNOSIS — C9000 Multiple myeloma not having achieved remission: Secondary | ICD-10-CM | POA: Diagnosis not present

## 2018-03-29 ENCOUNTER — Ambulatory Visit
Admission: RE | Admit: 2018-03-29 | Discharge: 2018-03-29 | Disposition: A | Discharge status: Home / Self Care | Payer: 59 | Source: Ambulatory Visit | Attending: Radiation Oncology | Admitting: Radiation Oncology

## 2018-03-29 ENCOUNTER — Ambulatory Visit: Payer: 59

## 2018-03-29 DIAGNOSIS — C9000 Multiple myeloma not having achieved remission: Secondary | ICD-10-CM | POA: Diagnosis not present

## 2018-03-29 DIAGNOSIS — Z510 Encounter for antineoplastic radiation therapy: Secondary | ICD-10-CM | POA: Diagnosis not present

## 2018-03-30 ENCOUNTER — Ambulatory Visit: Payer: 59

## 2018-03-30 ENCOUNTER — Ambulatory Visit
Admission: RE | Admit: 2018-03-30 | Discharge: 2018-03-30 | Disposition: A | Discharge status: Home / Self Care | Payer: 59 | Source: Ambulatory Visit | Attending: Radiation Oncology | Admitting: Radiation Oncology

## 2018-03-30 DIAGNOSIS — Z510 Encounter for antineoplastic radiation therapy: Secondary | ICD-10-CM | POA: Diagnosis not present

## 2018-03-30 DIAGNOSIS — C9000 Multiple myeloma not having achieved remission: Secondary | ICD-10-CM | POA: Diagnosis not present

## 2018-03-31 ENCOUNTER — Ambulatory Visit
Admission: RE | Admit: 2018-03-31 | Discharge: 2018-03-31 | Disposition: A | Discharge status: Home / Self Care | Payer: 59 | Source: Ambulatory Visit | Attending: Radiation Oncology | Admitting: Radiation Oncology

## 2018-03-31 DIAGNOSIS — Z510 Encounter for antineoplastic radiation therapy: Secondary | ICD-10-CM | POA: Diagnosis not present

## 2018-03-31 DIAGNOSIS — C9000 Multiple myeloma not having achieved remission: Secondary | ICD-10-CM | POA: Diagnosis not present

## 2018-04-03 ENCOUNTER — Other Ambulatory Visit: Payer: Self-pay | Admitting: *Deleted

## 2018-04-03 ENCOUNTER — Ambulatory Visit
Admission: RE | Admit: 2018-04-03 | Discharge: 2018-04-03 | Disposition: A | Discharge status: Home / Self Care | Payer: 59 | Source: Ambulatory Visit | Attending: Radiation Oncology | Admitting: Radiation Oncology

## 2018-04-03 DIAGNOSIS — C9000 Multiple myeloma not having achieved remission: Secondary | ICD-10-CM | POA: Diagnosis not present

## 2018-04-03 DIAGNOSIS — Z510 Encounter for antineoplastic radiation therapy: Secondary | ICD-10-CM | POA: Diagnosis not present

## 2018-04-04 ENCOUNTER — Ambulatory Visit
Admission: RE | Admit: 2018-04-04 | Discharge: 2018-04-04 | Disposition: A | Discharge status: Home / Self Care | Payer: 59 | Source: Ambulatory Visit | Attending: Radiation Oncology | Admitting: Radiation Oncology

## 2018-04-04 DIAGNOSIS — C9000 Multiple myeloma not having achieved remission: Secondary | ICD-10-CM | POA: Diagnosis not present

## 2018-04-04 DIAGNOSIS — Z510 Encounter for antineoplastic radiation therapy: Secondary | ICD-10-CM | POA: Diagnosis not present

## 2018-04-05 ENCOUNTER — Other Ambulatory Visit: Payer: Self-pay | Admitting: *Deleted

## 2018-04-05 ENCOUNTER — Other Ambulatory Visit: Payer: Self-pay | Admitting: Hematology & Oncology

## 2018-04-05 ENCOUNTER — Ambulatory Visit: Payer: 59

## 2018-04-05 DIAGNOSIS — C9000 Multiple myeloma not having achieved remission: Secondary | ICD-10-CM

## 2018-04-05 DIAGNOSIS — C9002 Multiple myeloma in relapse: Secondary | ICD-10-CM

## 2018-04-05 MED ORDER — LORAZEPAM 0.5 MG PO TABS: 0.5000 mg | ORAL_TABLET | Freq: Four times a day (QID) | ORAL | 0 refills | Status: DC | PRN

## 2018-04-05 MED ORDER — ONDANSETRON HCL 8 MG PO TABS: 8.0000 mg | ORAL_TABLET | Freq: Two times a day (BID) | ORAL | 1 refills | Status: DC | PRN

## 2018-04-05 MED ORDER — PROCHLORPERAZINE MALEATE 10 MG PO TABS: 10.0000 mg | ORAL_TABLET | Freq: Four times a day (QID) | ORAL | 1 refills | Status: DC | PRN

## 2018-04-05 MED ORDER — DEXAMETHASONE 4 MG PO TABS: ORAL_TABLET | ORAL | 3 refills | Status: DC

## 2018-04-05 MED ORDER — LIDOCAINE-PRILOCAINE 2.5-2.5 % EX CREA: TOPICAL_CREAM | CUTANEOUS | 3 refills | Status: DC

## 2018-04-10 ENCOUNTER — Other Ambulatory Visit: Payer: 59

## 2018-04-10 ENCOUNTER — Inpatient Hospital Stay (HOSPITAL_BASED_OUTPATIENT_CLINIC_OR_DEPARTMENT_OTHER): Payer: 59 | Admitting: Hematology & Oncology

## 2018-04-10 ENCOUNTER — Inpatient Hospital Stay: Payer: 59

## 2018-04-10 ENCOUNTER — Other Ambulatory Visit: Payer: Self-pay

## 2018-04-10 ENCOUNTER — Ambulatory Visit
Admission: RE | Admit: 2018-04-10 | Discharge: 2018-04-10 | Disposition: A | Discharge status: Home / Self Care | Payer: 59 | Source: Ambulatory Visit | Attending: Radiation Oncology | Admitting: Radiation Oncology

## 2018-04-10 ENCOUNTER — Encounter: Payer: Self-pay | Admitting: Hematology & Oncology

## 2018-04-10 VITALS — BP 150/58 | HR 87 | Temp 97.9°F | Resp 19

## 2018-04-10 DIAGNOSIS — C9002 Multiple myeloma in relapse: Secondary | ICD-10-CM

## 2018-04-10 DIAGNOSIS — K589 Irritable bowel syndrome without diarrhea: Secondary | ICD-10-CM

## 2018-04-10 DIAGNOSIS — Z510 Encounter for antineoplastic radiation therapy: Secondary | ICD-10-CM | POA: Insufficient documentation

## 2018-04-10 DIAGNOSIS — C9000 Multiple myeloma not having achieved remission: Secondary | ICD-10-CM | POA: Insufficient documentation

## 2018-04-10 DIAGNOSIS — D500 Iron deficiency anemia secondary to blood loss (chronic): Secondary | ICD-10-CM

## 2018-04-10 LAB — CBC WITH DIFFERENTIAL (CANCER CENTER ONLY)
ABS IMMATURE GRANULOCYTES: 0.01 10*3/uL (ref 0.00–0.07)
Basophils Absolute: 0 10*3/uL (ref 0.0–0.1)
Basophils Relative: 1 %
EOS ABS: 0.1 10*3/uL (ref 0.0–0.5)
Eosinophils Relative: 6 %
HCT: 31.5 % — ABNORMAL LOW (ref 36.0–46.0)
Hemoglobin: 10.2 g/dL — ABNORMAL LOW (ref 12.0–15.0)
Immature Granulocytes: 1 %
Lymphocytes Relative: 13 %
Lymphs Abs: 0.3 10*3/uL — ABNORMAL LOW (ref 0.7–4.0)
MCH: 30 pg (ref 26.0–34.0)
MCHC: 32.4 g/dL (ref 30.0–36.0)
MCV: 92.6 fL (ref 80.0–100.0)
MONO ABS: 0.4 10*3/uL (ref 0.1–1.0)
MONOS PCT: 20 %
NEUTROS ABS: 1.3 10*3/uL — AB (ref 1.7–7.7)
NEUTROS PCT: 59 %
PLATELETS: 111 10*3/uL — AB (ref 150–400)
RBC: 3.4 MIL/uL — AB (ref 3.87–5.11)
RDW: 13.1 % (ref 11.5–15.5)
WBC: 2.1 10*3/uL — AB (ref 4.0–10.5)
nRBC: 0 % (ref 0.0–0.2)

## 2018-04-10 LAB — CMP (CANCER CENTER ONLY)
ALT: 11 U/L (ref 0–44)
ANION GAP: 7 (ref 5–15)
AST: 18 U/L (ref 15–41)
Albumin: 3.9 g/dL (ref 3.5–5.0)
Alkaline Phosphatase: 29 U/L — ABNORMAL LOW (ref 38–126)
BUN: 21 mg/dL (ref 8–23)
CHLORIDE: 106 mmol/L (ref 98–111)
CO2: 26 mmol/L (ref 22–32)
Calcium: 9.6 mg/dL (ref 8.9–10.3)
Creatinine: 0.82 mg/dL (ref 0.44–1.00)
Glucose, Bld: 95 mg/dL (ref 70–99)
POTASSIUM: 4.1 mmol/L (ref 3.5–5.1)
SODIUM: 139 mmol/L (ref 135–145)
Total Bilirubin: 0.4 mg/dL (ref 0.3–1.2)
Total Protein: 7.4 g/dL (ref 6.5–8.1)

## 2018-04-10 MED ORDER — HEPARIN SOD (PORK) LOCK FLUSH 100 UNIT/ML IV SOLN
500.0000 [IU] | Freq: Once | INTRAVENOUS | Status: AC
  Administered 2018-04-10: 500 [IU] via INTRAVENOUS
  Filled 2018-04-10: qty 5

## 2018-04-10 MED ORDER — SODIUM CHLORIDE 0.9% FLUSH
10.0000 mL | INTRAVENOUS | Status: AC | PRN
  Administered 2018-04-10: 10 mL via INTRAVENOUS
  Filled 2018-04-10: qty 10

## 2018-04-10 NOTE — Progress Notes (Signed)
Hematology and Oncology Follow Up Visit  LEKESHA CLAW [id number] March 15, 1956 62 y.o. 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 Nix is here today with her husband for follow-up.  Unfortunately, he clearly looks like we now have progressive disease.  The M spike when we last saw her was up to 1.1 g/dL.  The IgG level was 1765 mg/dL.  Her Kappa light chain was 2.2 mg/dL.  We are going to have to make a change in treatment.  It has been difficult to try to keep her on schedule.  She has other health issues that she says have prevent her from getting treated on schedule.  The biggest issue has been irritable bowel syndrome.  She is yet to start radiation therapy for the plasmacytoma on the rib.  She says this will start next week.  I think our best option would be to consider elotuzimab.  I know that this is used in combination with pomalidomide.  I will ultimately add pomalidomide but I am not sure that she would be amenable to having a combination of treatment.  As much as we have wanted to be aggressive, Ms. Ribble this is not allowed [place] to be aggressive.  She never would consent to a autologous bone marrow transplant.  She has had delays in her treatment because of other issues that she is had to deal with.  We actually have done quite well given the outside problems with her treatment schedules.  I am impressed  that she has done this well so far.  We have been treating her I think for at least 3 or 4 years.  Ultimately, we might be looking at CAR-T therapy.  Again, I doubt that she would ever agree to a autologous bone marrow transplant.  I know that she had been on Darzalex in the past.  She really did not have much Darzalex before we saw some progression.  I would like to try to avoid chemotherapy for now.  I just think that she has a tough time with chemotherapy.  I do not see any problem with doing the elotuzimab along with the radiation.  Overall, I said her performance status is ECOG 1.    Medications:  Allergies as of 04/10/2018      Reactions   Barium-containing Compounds Diarrhea   Oral Redicat gives pt severe diarrhea, causing delay in scan time, see previous ct scan notes for details   Codeine Palpitations      Medication List        Accurate as of 04/10/18  9:10 AM. Always use your most recent med list.          acetaminophen 500 MG tablet Commonly known as:  TYLENOL Take 500 mg by mouth every 6 (six) hours as needed for mild pain or headache.   acyclovir 400 MG tablet Commonly known as:  ZOVIRAX Take 1 tablet (400 mg total) by mouth daily.  B-6 250 MG Tabs TAKE 1 TABLET (250 MG TOTAL) BY MOUTH DAILY.   dexamethasone 4 MG tablet Commonly known as:  DECADRON Take 5 tablets (4m) between 3 and 24 hours prior to chemotherapy. On non-chemotherapy days 8 and 22 , take 5 tablets (20 mg). Repeat every 28 days.   HYDROcodone-acetaminophen 5-325 MG tablet Commonly known as:  NORCO/VICODIN Take 1 tablet by mouth every 4 (four) hours as needed.   hyoscyamine 0.125 MG SL tablet Commonly known as:  LEVSIN SL PLACE 1 TABLET (0.125 MG TOTAL) UNDER THE TONGUE EVERY 4 (FOUR) HOURS AS NEEDED.   lidocaine-prilocaine cream Commonly known as:  EMLA Apply to affected area once   LORazepam 0.5 MG tablet Commonly known as:  ATIVAN Take 1 tablet (0.5 mg total) by mouth every 6  (six) hours as needed (Nausea or vomiting).   magnesium oxide 400 (241.3 Mg) MG tablet Commonly known as:  MAG-OX TAKE 1 TABLET BY MOUTH TWICE A DAY   metroNIDAZOLE 500 MG tablet Commonly known as:  FLAGYL Take 1 tablet (500 mg total) by mouth 3 (three) times daily.   montelukast 10 MG tablet Commonly known as:  SINGULAIR Take 1 tablet (10 mg total) by mouth at bedtime.   multivitamin tablet Take 1 tablet by mouth every evening.   ondansetron 8 MG tablet Commonly known as:  ZOFRAN TAKE 1 TABLET BY MOUTH TWICE A DAY FOR NAUSEA AND VOMITING 1 TABLET 1 HOUR PRIOR TO CHEMOTHERAPY   ondansetron 8 MG tablet Commonly known as:  ZOFRAN Take 1 tablet (8 mg total) by mouth 2 (two) times daily as needed (Nausea or vomiting).   pantoprazole 40 MG tablet Commonly known as:  PROTONIX Take 1 tablet (40 mg total) by mouth daily.   polyethylene glycol powder powder Commonly known as:  GLYCOLAX/MIRALAX 1 capful daily as needed   prochlorperazine 10 MG tablet Commonly known as:  COMPAZINE Take 1 tablet (10 mg total) by mouth every 6 (six) hours as needed for nausea or vomiting.   prochlorperazine 10 MG tablet Commonly known as:  COMPAZINE Take 1 tablet (10 mg total) by mouth every 6 (six) hours as needed (Nausea or vomiting).   torsemide 20 MG tablet Commonly known as:  DEMADEX Take 1 tablet (20 mg total) by mouth daily as needed.   Vitamin D (Ergocalciferol) 1.25 MG (50000 UT) Caps capsule Commonly known as:  DRISDOL TAKE 1 CAPSULE (50,000 UNITS TOTAL) BY MOUTH ONCE A WEEK.       Allergies:  Allergies  Allergen Reactions  . Barium-Containing Compounds Diarrhea    Oral Redicat gives pt severe diarrhea, causing delay in scan time, see previous ct scan notes for details  . Codeine Palpitations    Past Medical History, Surgical history, Social history, and Family History were reviewed and updated.  Review of Systems: Review of Systems  Constitutional: Negative.   HENT:  Negative.   Eyes: Negative.   Respiratory: Negative.   Cardiovascular: Negative.   Gastrointestinal: Negative.   Genitourinary: Negative.   Musculoskeletal: Positive for myalgias.  Skin: Negative.   Neurological: Negative.   Endo/Heme/Allergies: Negative.   Psychiatric/Behavioral: Negative.    .Mar[place] Kitchen  Physical Exam:  temperature is 97.9 F (36.6 C). Her blood pressure is 150/58 (abnormal) and her pulse is 87. Her respiration is 19.   Wt Readings from Last 3 Encounters:  03/22/18 168 lb (76.2 kg)  03/14/18 168 lb 6 oz (76.4 kg)  03/06/18 166 lb (75.3 kg)    Physical Exam  Constitutional: She is oriented to person, place, and time.  HENT:  Head: Normocephalic and atraumatic.  Mouth/Throat: Oropharynx is clear and moist.  Eyes: Pupils are equal, round, and reactive to light. EOM are normal.  Neck: Normal range of motion.  Cardiovascular: Normal rate, regular rhythm and normal heart sounds.  Pulmonary/Chest: Effort normal and breath sounds normal.  Abdominal: Soft. Bowel sounds are normal.  Musculoskeletal: Normal range of motion. She exhibits no edema, tenderness or deformity.  Lymphadenopathy:    She has no cervical adenopathy.  Neurological: She is alert and oriented to person, place, and time.  Skin: Skin is warm and dry. No rash noted. No erythema.  Psychiatric: She has a normal mood and affect. Her behavior is normal. Judgment and thought content normal.  Vitals reviewed.    Lab Results  Component Value Date   WBC 2.1 (L) 04/10/2018   HGB 10.2 (L) 04/10/2018   HCT 31.5 (L) 04/10/2018   MCV 92.6 04/10/2018   PLT 111 (L) 04/10/2018   Lab Results  Component Value Date   FERRITIN 435 (H) 03/06/2018   IRON 86 03/06/2018   TIBC 255 03/06/2018   UIBC 169 03/06/2018   IRONPCTSAT 34 03/06/2018   Lab Results  Component Value Date   RETICCTPCT 1.5 12/12/2017   RBC 3.40 (L) 04/10/2018   RETICCTABS 35.9 12/25/2014   Lab Results  Component Value Date   KPAFRELGTCHN  21.9 (H) 03/06/2018   LAMBDASER 6.9 03/06/2018   KAPLAMBRATIO 21.13 (H) 03/13/2018   Lab Results  Component Value Date   IGGSERUM 1,665 (H) 03/06/2018   IGGSERUM 1,765 (H) 03/06/2018   IGA 29 (L) 03/06/2018   IGA 31 (L) 03/06/2018   IGMSERUM 7 (L) 03/06/2018   IGMSERUM 8 (L) 03/06/2018   Lab Results  Component Value Date   TOTALPROTELP 6.8 03/06/2018   ALBUMINELP 3.8 03/06/2018   A1GS 0.2 03/06/2018   A2GS 0.5 03/06/2018   BETS 0.8 03/06/2018   BETA2SER 0.3 05/07/2015   GAMS 1.5 03/06/2018   MSPIKE 1.1 (H) 03/06/2018   SPEI Comment 02/06/2018     Chemistry      Component Value Date/Time   NA 139 03/22/2018 0939   NA 143 05/09/2017 0812   NA 139 02/20/2016 1207   K 3.8 03/22/2018 0939   K 4.0 05/09/2017 0812   K 3.8 02/20/2016 1207   CL 106 03/22/2018 0939   CL 108 05/09/2017 0812   CO2 25 03/22/2018 0939   CO2 26 05/09/2017 0812   CO2 29 02/20/2016 1207   BUN 20 03/22/2018 0939   BUN 13 05/09/2017 0812   BUN 22.3 02/20/2016 1207   CREATININE 0.90 03/22/2018 0939   CREATININE 1.10 03/06/2018 0850   CREATININE 0.9 05/09/2017 0812   CREATININE 1.3 (H) 02/20/2016 1207      Component Value Date/Time   CALCIUM 9.2 03/22/2018 0939   CALCIUM 8.3 05/09/2017 0812   CALCIUM 9.7 02/20/2016 1207   ALKPHOS 41 03/06/2018 0850   ALKPHOS 123 (H) 05/09/2017 0812   ALKPHOS 62 02/20/2016 1207   AST 31 03/06/2018 0850   AST 27 02/20/2016 1207   ALT 25 03/06/2018 0850   ALT 26 05/09/2017 0812   ALT 19 02/20/2016 1207   BILITOT 0.8 03/06/2018 0850   BILITOT 0.44 02/20/2016 1207      Impression and Plan: Ms. Barkow is a very pleasant 62 yo African American female with IgG kappa myeloma.   We now have to make another treatment changed due to her  M spike increasing.  It still is not that much of an increase and that she still does not have a significant myeloma burden.  I think at some point, we are going to have to get a bone marrow biopsy on her.  I know that she has been  reluctant to do this.  However, we are at a point where I think seeing her cytogenetics will help [place] out.  We will see how she does on the elotuzimab.  I will then try to add pomalidomide.  Again I think this might be difficult to incorporate.  She probably is going to need a very low dose of pomalidomide (1 or 2 mg).  I spent about 40 minutes with her today.  She is with her husband.  They are family to me.  I really love both of them.  It is been very enjoyable just having fellowship with them.  We will see about getting started next week.  I told her that we are going to have to be patient and see how the elotuzimab works.    [name], MD 12/2/20199:10 AM

## 2018-04-10 NOTE — Addendum Note (Signed)
Addended by: Perlie Gold on: 04/10/2018 10:06 AM   Modules accepted: Orders, SmartSet

## 2018-04-10 NOTE — Patient Instructions (Signed)
Implanted Port Home Guide An implanted port is a type of central line that is placed under the skin. Central lines are used to provide IV access when treatment or nutrition needs to be given through a person's veins. Implanted ports are used for long-term IV access. An implanted port may be placed because:  You need IV medicine that would be irritating to the small veins in your hands or arms.  You need long-term IV medicines, such as antibiotics.  You need IV nutrition for a long period.  You need frequent blood draws for lab tests.  You need dialysis.  Implanted ports are usually placed in the chest area, but they can also be placed in the upper arm, the abdomen, or the leg. An implanted port has two main parts:  Reservoir. The reservoir is round and will appear as a small, raised area under your skin. The reservoir is the part where a needle is inserted to give medicines or draw blood.  Catheter. The catheter is a thin, flexible tube that extends from the reservoir. The catheter is placed into a large vein. Medicine that is inserted into the reservoir goes into the catheter and then into the vein.  How will I care for my incision site? Do not get the incision site wet. Bathe or shower as directed by your health care provider. How is my port accessed? Special steps must be taken to access the port:  Before the port is accessed, a numbing cream can be placed on the skin. This helps numb the skin over the port site.  Your health care provider uses a sterile technique to access the port. ? Your health care provider must put on a mask and sterile gloves. ? The skin over your port is cleaned carefully with an antiseptic and allowed to dry. ? The port is gently pinched between sterile gloves, and a needle is inserted into the port.  Only "non-coring" port needles should be used to access the port. Once the port is accessed, a blood return should be checked. This helps ensure that the port  is in the vein and is not clogged.  If your port needs to remain accessed for a constant infusion, a clear (transparent) bandage will be placed over the needle site. The bandage and needle will need to be changed every week, or as directed by your health care provider.  Keep the bandage covering the needle clean and dry. Do not get it wet. Follow your health care provider's instructions on how to take a shower or bath while the port is accessed.  If your port does not need to stay accessed, no bandage is needed over the port.  What is flushing? Flushing helps keep the port from getting clogged. Follow your health care provider's instructions on how and when to flush the port. Ports are usually flushed with saline solution or a medicine called heparin. The need for flushing will depend on how the port is used.  If the port is used for intermittent medicines or blood draws, the port will need to be flushed: ? After medicines have been given. ? After blood has been drawn. ? As part of routine maintenance.  If a constant infusion is running, the port may not need to be flushed.  How long will my port stay implanted? The port can stay in for as long as your health care provider thinks it is needed. When it is time for the port to come out, surgery will be   done to remove it. The procedure is similar to the one performed when the port was put in. When should I seek immediate medical care? When you have an implanted port, you should seek immediate medical care if:  You notice a bad smell coming from the incision site.  You have swelling, redness, or drainage at the incision site.  You have more swelling or pain at the port site or the surrounding area.  You have a fever that is not controlled with medicine.  This information is not intended to replace advice given to you by your health care provider. Make sure you discuss any questions you have with your health care provider. Document  Released: 04/26/2005 Document Revised: 10/02/2015 Document Reviewed: 01/01/2013 Elsevier Interactive Patient Education  2017 [employer]  

## 2018-04-11 ENCOUNTER — Ambulatory Visit
Admission: RE | Admit: 2018-04-11 | Discharge: 2018-04-11 | Disposition: A | Discharge status: Home / Self Care | Payer: 59 | Source: Ambulatory Visit | Attending: Radiation Oncology | Admitting: Radiation Oncology

## 2018-04-11 ENCOUNTER — Ambulatory Visit: Payer: 59

## 2018-04-11 DIAGNOSIS — C9000 Multiple myeloma not having achieved remission: Secondary | ICD-10-CM | POA: Diagnosis not present

## 2018-04-11 DIAGNOSIS — Z510 Encounter for antineoplastic radiation therapy: Secondary | ICD-10-CM | POA: Diagnosis not present

## 2018-04-11 LAB — KAPPA/LAMBDA LIGHT CHAINS
KAPPA, LAMDA LIGHT CHAIN RATIO: 3.77 — AB (ref 0.26–1.65)
Kappa free light chain: 29.8 mg/L — ABNORMAL HIGH (ref 3.3–19.4)
Lambda free light chains: 7.9 mg/L (ref 5.7–26.3)

## 2018-04-11 LAB — IGG, IGA, IGM
IGG (IMMUNOGLOBIN G), SERUM: 2323 mg/dL — AB (ref 700–1600)
IgA: 37 mg/dL — ABNORMAL LOW (ref 87–352)
IgM (Immunoglobulin M), Srm: 8 mg/dL — ABNORMAL LOW (ref 26–217)

## 2018-04-12 ENCOUNTER — Ambulatory Visit
Admission: RE | Admit: 2018-04-12 | Discharge: 2018-04-12 | Disposition: A | Discharge status: Home / Self Care | Payer: 59 | Source: Ambulatory Visit | Attending: Radiation Oncology | Admitting: Radiation Oncology

## 2018-04-12 ENCOUNTER — Ambulatory Visit: Payer: 59

## 2018-04-12 DIAGNOSIS — Z510 Encounter for antineoplastic radiation therapy: Secondary | ICD-10-CM | POA: Diagnosis not present

## 2018-04-12 DIAGNOSIS — C9000 Multiple myeloma not having achieved remission: Secondary | ICD-10-CM | POA: Diagnosis not present

## 2018-04-12 LAB — PROTEIN ELECTROPHORESIS, SERUM, WITH REFLEX
A/G Ratio: 1 (ref 0.7–1.7)
ALPHA-1-GLOBULIN: 0.2 g/dL (ref 0.0–0.4)
Albumin ELP: 3.6 g/dL (ref 2.9–4.4)
Alpha-2-Globulin: 0.6 g/dL (ref 0.4–1.0)
Beta Globulin: 0.9 g/dL (ref 0.7–1.3)
GAMMA GLOBULIN: 1.8 g/dL (ref 0.4–1.8)
GLOBULIN, TOTAL: 3.5 g/dL (ref 2.2–3.9)
M-SPIKE, %: 1.4 g/dL — AB
SPEP Interpretation: 0
TOTAL PROTEIN ELP: 7.1 g/dL (ref 6.0–8.5)

## 2018-04-12 LAB — IMMUNOFIXATION REFLEX, SERUM
IGA: 36 mg/dL — AB (ref 87–352)
IGG (IMMUNOGLOBIN G), SERUM: 2271 mg/dL — AB (ref 700–1600)
IgM (Immunoglobulin M), Srm: 8 mg/dL — ABNORMAL LOW (ref 26–217)

## 2018-04-13 ENCOUNTER — Ambulatory Visit
Admission: RE | Admit: 2018-04-13 | Discharge: 2018-04-13 | Disposition: A | Discharge status: Home / Self Care | Payer: 59 | Source: Ambulatory Visit | Attending: Radiation Oncology | Admitting: Radiation Oncology

## 2018-04-13 ENCOUNTER — Ambulatory Visit: Payer: 59

## 2018-04-13 DIAGNOSIS — C9000 Multiple myeloma not having achieved remission: Secondary | ICD-10-CM | POA: Diagnosis not present

## 2018-04-13 DIAGNOSIS — Z510 Encounter for antineoplastic radiation therapy: Secondary | ICD-10-CM | POA: Diagnosis not present

## 2018-04-14 ENCOUNTER — Ambulatory Visit
Admission: RE | Admit: 2018-04-14 | Discharge: 2018-04-14 | Disposition: A | Discharge status: Home / Self Care | Payer: 59 | Source: Ambulatory Visit | Attending: Radiation Oncology | Admitting: Radiation Oncology

## 2018-04-14 DIAGNOSIS — C9000 Multiple myeloma not having achieved remission: Secondary | ICD-10-CM | POA: Diagnosis not present

## 2018-04-14 DIAGNOSIS — Z510 Encounter for antineoplastic radiation therapy: Secondary | ICD-10-CM | POA: Diagnosis not present

## 2018-04-17 ENCOUNTER — Inpatient Hospital Stay (HOSPITAL_BASED_OUTPATIENT_CLINIC_OR_DEPARTMENT_OTHER): Payer: 59 | Admitting: Hematology & Oncology

## 2018-04-17 ENCOUNTER — Other Ambulatory Visit: Payer: Self-pay | Admitting: *Deleted

## 2018-04-17 ENCOUNTER — Inpatient Hospital Stay: Payer: 59

## 2018-04-17 ENCOUNTER — Encounter: Payer: Self-pay | Admitting: Hematology & Oncology

## 2018-04-17 ENCOUNTER — Other Ambulatory Visit: Payer: Self-pay

## 2018-04-17 VITALS — BP 141/76 | HR 73 | Temp 97.7°F | Resp 18

## 2018-04-17 DIAGNOSIS — C9000 Multiple myeloma not having achieved remission: Secondary | ICD-10-CM | POA: Diagnosis not present

## 2018-04-17 DIAGNOSIS — C9002 Multiple myeloma in relapse: Secondary | ICD-10-CM

## 2018-04-17 DIAGNOSIS — K589 Irritable bowel syndrome without diarrhea: Secondary | ICD-10-CM | POA: Diagnosis not present

## 2018-04-17 DIAGNOSIS — Z510 Encounter for antineoplastic radiation therapy: Secondary | ICD-10-CM | POA: Diagnosis not present

## 2018-04-17 DIAGNOSIS — D500 Iron deficiency anemia secondary to blood loss (chronic): Secondary | ICD-10-CM

## 2018-04-17 LAB — CBC WITH DIFFERENTIAL (CANCER CENTER ONLY)
ABS IMMATURE GRANULOCYTES: 0.02 10*3/uL (ref 0.00–0.07)
BASOS PCT: 1 %
Basophils Absolute: 0 10*3/uL (ref 0.0–0.1)
Eosinophils Absolute: 0.1 10*3/uL (ref 0.0–0.5)
Eosinophils Relative: 7 %
HCT: 31.7 % — ABNORMAL LOW (ref 36.0–46.0)
Hemoglobin: 10 g/dL — ABNORMAL LOW (ref 12.0–15.0)
Immature Granulocytes: 1 %
Lymphocytes Relative: 16 %
Lymphs Abs: 0.3 10*3/uL — ABNORMAL LOW (ref 0.7–4.0)
MCH: 29.6 pg (ref 26.0–34.0)
MCHC: 31.5 g/dL (ref 30.0–36.0)
MCV: 93.8 fL (ref 80.0–100.0)
MONO ABS: 0.3 10*3/uL (ref 0.1–1.0)
MONOS PCT: 17 %
NEUTROS ABS: 1.2 10*3/uL — AB (ref 1.7–7.7)
Neutrophils Relative %: 58 %
PLATELETS: 97 10*3/uL — AB (ref 150–400)
RBC: 3.38 MIL/uL — AB (ref 3.87–5.11)
RDW: 13.5 % (ref 11.5–15.5)
WBC Count: 2 10*3/uL — ABNORMAL LOW (ref 4.0–10.5)
nRBC: 0 % (ref 0.0–0.2)

## 2018-04-17 LAB — CMP (CANCER CENTER ONLY)
ALK PHOS: 35 U/L — AB (ref 38–126)
ALT: 13 U/L (ref 0–44)
AST: 20 U/L (ref 15–41)
Albumin: 3.9 g/dL (ref 3.5–5.0)
Anion gap: 6 (ref 5–15)
BILIRUBIN TOTAL: 0.4 mg/dL (ref 0.3–1.2)
BUN: 18 mg/dL (ref 8–23)
CALCIUM: 9.2 mg/dL (ref 8.9–10.3)
CO2: 26 mmol/L (ref 22–32)
CREATININE: 0.87 mg/dL (ref 0.44–1.00)
Chloride: 105 mmol/L (ref 98–111)
GFR, Est AFR Am: >60 mL/min (ref >60)
GFR, Estimated: >60 mL/min (ref >60)
Glucose, Bld: 94 mg/dL (ref 70–99)
Potassium: 4.1 mmol/L (ref 3.5–5.1)
Sodium: 137 mmol/L (ref 135–145)
TOTAL PROTEIN: 7.4 g/dL (ref 6.5–8.1)

## 2018-04-17 LAB — LACTATE DEHYDROGENASE: LDH: 221 U/L — ABNORMAL HIGH (ref 98–192)

## 2018-04-17 MED ORDER — FAMOTIDINE IN NACL 20-0.9 MG/50ML-% IV SOLN
INTRAVENOUS | Status: AC
  Filled 2018-04-17: qty 50

## 2018-04-17 MED ORDER — SODIUM CHLORIDE 0.9 % IV SOLN
Freq: Once | INTRAVENOUS | Status: AC
  Administered 2018-04-17: 10:00:00 via INTRAVENOUS
  Filled 2018-04-17: qty 250

## 2018-04-17 MED ORDER — DIPHENHYDRAMINE HCL 25 MG PO CAPS
50.0000 mg | ORAL_CAPSULE | Freq: Once | ORAL | Status: AC
  Administered 2018-04-17: 50 mg via ORAL

## 2018-04-17 MED ORDER — PROCHLORPERAZINE MALEATE 10 MG PO TABS
ORAL_TABLET | ORAL | Status: AC
  Filled 2018-04-17: qty 1

## 2018-04-17 MED ORDER — FAMOTIDINE IN NACL 20-0.9 MG/50ML-% IV SOLN
20.0000 mg | Freq: Once | INTRAVENOUS | Status: AC
  Administered 2018-04-17: 20 mg via INTRAVENOUS

## 2018-04-17 MED ORDER — DEXAMETHASONE SODIUM PHOSPHATE 10 MG/ML IJ SOLN
INTRAMUSCULAR | Status: AC
  Filled 2018-04-17: qty 1

## 2018-04-17 MED ORDER — SODIUM CHLORIDE 0.9 % IV SOLN
800.0000 mg | Freq: Once | INTRAVENOUS | Status: AC
  Administered 2018-04-17: 800 mg via INTRAVENOUS
  Filled 2018-04-17: qty 32

## 2018-04-17 MED ORDER — SODIUM CHLORIDE 0.9% FLUSH
10.0000 mL | Freq: Once | INTRAVENOUS | Status: AC
  Administered 2018-04-17: 10 mL
  Filled 2018-04-17: qty 10

## 2018-04-17 MED ORDER — ACETAMINOPHEN 325 MG PO TABS
ORAL_TABLET | ORAL | Status: AC
  Filled 2018-04-17: qty 2

## 2018-04-17 MED ORDER — DIPHENHYDRAMINE HCL 25 MG PO CAPS
ORAL_CAPSULE | ORAL | Status: AC
  Filled 2018-04-17: qty 2

## 2018-04-17 MED ORDER — HEPARIN SOD (PORK) LOCK FLUSH 100 UNIT/ML IV SOLN
500.0000 [IU] | Freq: Once | INTRAVENOUS | Status: AC | PRN
  Administered 2018-04-17: 500 [IU]
  Filled 2018-04-17: qty 5

## 2018-04-17 MED ORDER — ACETAMINOPHEN 325 MG PO TABS
650.0000 mg | ORAL_TABLET | Freq: Once | ORAL | Status: AC
  Administered 2018-04-17: 650 mg via ORAL

## 2018-04-17 MED ORDER — SODIUM CHLORIDE 0.9% FLUSH
10.0000 mL | INTRAVENOUS | Status: DC | PRN
  Administered 2018-04-17: 10 mL
  Filled 2018-04-17: qty 10

## 2018-04-17 MED ORDER — PROCHLORPERAZINE MALEATE 10 MG PO TABS
10.0000 mg | ORAL_TABLET | Freq: Once | ORAL | Status: AC
  Administered 2018-04-17: 10 mg via ORAL

## 2018-04-17 MED ORDER — DEXAMETHASONE SODIUM PHOSPHATE 10 MG/ML IJ SOLN
8.0000 mg | Freq: Once | INTRAMUSCULAR | Status: AC
  Administered 2018-04-17: 8 mg via INTRAVENOUS

## 2018-04-17 NOTE — Progress Notes (Signed)
Hematology and Oncology Follow Up Visit  Barbara Cook [id number] 06-14-1955 62 y.o. 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  Unfortunately, he clearly looks like we now have progressive disease.  The M spike when we last saw her was up to 1.4 g/dL.  The IgG level was 2185 mg/dL.  Her Kappa light chain was 2.2 mg/dL.  We are going to have to make a change in treatment.  It has been difficult to try to keep her on schedule.  She has other health issues that she says have prevent her from getting treated on schedule.  The biggest issue has been irritable bowel syndrome.  She is yet to start radiation therapy for the plasmacytoma on the rib.  She says this will start next week.  I think our best option would be to consider elotuzimab.  I know that this is used in combination with pomalidomide.  I will ultimately add pomalidomide but I am not sure that she would be amenable to having a combination of treatment.  As much as we have wanted to be aggressive, Barbara Cook this is not allowed [place] to be aggressive.  She never would consent to a autologous bone marrow transplant.  She has had delays in her treatment because of other issues that she is had to deal with.  We actually have done quite well given the outside problems with her treatment schedules.  I am impressed  that she has done this well so far.  We have been treating her I think for at least 3 or 4 years.  Ultimately, we might be looking at CAR-T therapy.  Again, I doubt that she would ever agree to a autologous bone marrow transplant.  I know that she had been on Darzalex in the past.  She really did not have much Darzalex before we saw some progression.  I would like to try to avoid chemotherapy for now.  I just think that she has a tough time with chemotherapy.  I do not see any problem with doing the elotuzimab along with the radiation.  Overall, I said her performance status is ECOG 1.    Medications:  Allergies as of 04/17/2018      Reactions   Barium-containing Compounds Diarrhea   Oral Redicat gives pt severe diarrhea, causing delay in scan time, see previous ct scan notes for details   Codeine Palpitations      Medication List        Accurate as of 04/17/18 10:11 AM. Always use your most recent med list.          acetaminophen 500 MG tablet Commonly known as:  TYLENOL Take 500 mg by mouth every 6 (six) hours as needed for mild pain or headache.   acyclovir 400 MG tablet Commonly known as:  ZOVIRAX Take 1 tablet (400 mg total) by mouth daily.  B-6 250 MG Tabs TAKE 1 TABLET (250 MG TOTAL) BY MOUTH DAILY.   dexamethasone 4 MG tablet Commonly known as:  DECADRON Take 5 tablets (4m) between 3 and 24 hours prior to chemotherapy. On non-chemotherapy days 8 and 22 , take 5 tablets (20 mg). Repeat every 28 days.   HYDROcodone-acetaminophen 5-325 MG tablet Commonly known as:  NORCO/VICODIN Take 1 tablet by mouth every 4 (four) hours as needed.   hyoscyamine 0.125 MG SL tablet Commonly known as:  LEVSIN SL PLACE 1 TABLET (0.125 MG TOTAL) UNDER THE TONGUE EVERY 4 (FOUR) HOURS AS NEEDED.   lidocaine-prilocaine cream Commonly known as:  EMLA Apply to affected area once   LORazepam 0.5 MG tablet Commonly known as:  ATIVAN Take 1 tablet (0.5 mg total) by mouth every 6  (six) hours as needed (Nausea or vomiting).   magnesium oxide 400 (241.3 Mg) MG tablet Commonly known as:  MAG-OX TAKE 1 TABLET BY MOUTH TWICE A DAY   metroNIDAZOLE 500 MG tablet Commonly known as:  FLAGYL Take 1 tablet (500 mg total) by mouth 3 (three) times daily.   montelukast 10 MG tablet Commonly known as:  SINGULAIR Take 1 tablet (10 mg total) by mouth at bedtime.   multivitamin tablet Take 1 tablet by mouth every evening.   ondansetron 8 MG tablet Commonly known as:  ZOFRAN TAKE 1 TABLET BY MOUTH TWICE A DAY FOR NAUSEA AND VOMITING 1 TABLET 1 HOUR PRIOR TO CHEMOTHERAPY   ondansetron 8 MG tablet Commonly known as:  ZOFRAN Take 1 tablet (8 mg total) by mouth 2 (two) times daily as needed (Nausea or vomiting).   pantoprazole 40 MG tablet Commonly known as:  PROTONIX Take 1 tablet (40 mg total) by mouth daily.   polyethylene glycol powder powder Commonly known as:  GLYCOLAX/MIRALAX 1 capful daily as needed   prochlorperazine 10 MG tablet Commonly known as:  COMPAZINE Take 1 tablet (10 mg total) by mouth every 6 (six) hours as needed for nausea or vomiting.   prochlorperazine 10 MG tablet Commonly known as:  COMPAZINE Take 1 tablet (10 mg total) by mouth every 6 (six) hours as needed (Nausea or vomiting).   torsemide 20 MG tablet Commonly known as:  DEMADEX Take 1 tablet (20 mg total) by mouth daily as needed.   Vitamin D (Ergocalciferol) 1.25 MG (50000 UT) Caps capsule Commonly known as:  DRISDOL TAKE 1 CAPSULE (50,000 UNITS TOTAL) BY MOUTH ONCE A WEEK.       Allergies:  Allergies  Allergen Reactions  . Barium-Containing Compounds Diarrhea    Oral Redicat gives pt severe diarrhea, causing delay in scan time, see previous ct scan notes for details  . Codeine Palpitations    Past Medical History, Surgical history, Social history, and Family History were reviewed and updated.  Review of Systems: Review of Systems  Constitutional: Negative.   HENT:  Negative.   Eyes: Negative.   Respiratory: Negative.   Cardiovascular: Negative.   Gastrointestinal: Negative.   Genitourinary: Negative.   Musculoskeletal: Positive for myalgias.  Skin: Negative.   Neurological: Negative.   Endo/Heme/Allergies: Negative.   Psychiatric/Behavioral: Negative.    .Marland Kitchen  Physical Exam:  oral temperature is 97.7 F (36.5 C). Her blood pressure is 141/76 (abnormal) and her pulse is 73. Her respiration is 18 and oxygen saturation is 100%.   Wt Readings from Last 3 Encounters:  03/22/18 168 lb (76.2 kg)  03/14/18 168 lb 6 oz (76.4 kg)  03/06/18 166 lb (75.3  kg)    Physical Exam  Constitutional: She is oriented to person, place, and time.  HENT:  Head: Normocephalic and atraumatic.  Mouth/Throat: Oropharynx is clear and moist.  Eyes: Pupils are equal, round, and reactive to light. EOM are normal.  Neck: Normal range of motion.  Cardiovascular: Normal rate, regular rhythm and normal heart sounds.  Pulmonary/Chest: Effort normal and breath sounds normal.  Abdominal: Soft. Bowel sounds are normal.  Musculoskeletal: Normal range of motion. She exhibits no edema, tenderness or deformity.  Lymphadenopathy:    She has no cervical adenopathy.  Neurological: She is alert and oriented to person, place, and time.  Skin: Skin is warm and dry. No rash noted. No erythema.  Psychiatric: She has a normal mood and affect. Her behavior is normal. Judgment and thought content normal.  Vitals reviewed.    Lab Results  Component Value Date   WBC 2.0 (L) 04/17/2018   HGB 10.0 (L) 04/17/2018   HCT 31.7 (L) 04/17/2018   MCV 93.8 04/17/2018   PLT 97 (L) 04/17/2018   Lab Results  Component Value Date   FERRITIN 435 (H) 03/06/2018   IRON 86 03/06/2018   TIBC 255 03/06/2018   UIBC 169 03/06/2018   IRONPCTSAT 34 03/06/2018   Lab Results  Component Value Date   RETICCTPCT 1.5 12/12/2017   RBC 3.38 (L) 04/17/2018   RETICCTABS 35.9 12/25/2014   Lab Results    Component Value Date   KPAFRELGTCHN 29.8 (H) 04/10/2018   LAMBDASER 7.9 04/10/2018   KAPLAMBRATIO 3.77 (H) 04/10/2018   Lab Results  Component Value Date   IGGSERUM 2,323 (H) 04/10/2018   IGGSERUM 2,271 (H) 04/10/2018   IGA 37 (L) 04/10/2018   IGA 36 (L) 04/10/2018   IGMSERUM 8 (L) 04/10/2018   IGMSERUM 8 (L) 04/10/2018   Lab Results  Component Value Date   TOTALPROTELP 7.1 04/10/2018   ALBUMINELP 3.6 04/10/2018   A1GS 0.2 04/10/2018   A2GS 0.6 04/10/2018   BETS 0.9 04/10/2018   BETA2SER 0.3 05/07/2015   GAMS 1.8 04/10/2018   MSPIKE 1.4 (H) 04/10/2018   SPEI Comment 02/06/2018     Chemistry      Component Value Date/Time   NA 137 04/17/2018 0903   NA 143 05/09/2017 0812   NA 139 02/20/2016 1207   K 4.1 04/17/2018 0903   K 4.0 05/09/2017 0812   K 3.8 02/20/2016 1207   CL 105 04/17/2018 0903   CL 108 05/09/2017 0812   CO2 26 04/17/2018 0903   CO2 26 05/09/2017 0812   CO2 29 02/20/2016 1207   BUN 18 04/17/2018 0903   BUN 13 05/09/2017 0812   BUN 22.3 02/20/2016 1207   CREATININE 0.87 04/17/2018 0903   CREATININE 0.9 05/09/2017 0812   CREATININE 1.3 (H) 02/20/2016 1207      Component Value Date/Time   CALCIUM 9.2 04/17/2018 0903   CALCIUM 8.3 05/09/2017 0812   CALCIUM 9.7 02/20/2016 1207   ALKPHOS 35 (L) 04/17/2018 0903   ALKPHOS 123 (H) 05/09/2017 0812   ALKPHOS 62 02/20/2016 1207   AST 20 04/17/2018 0903   AST 27 02/20/2016 1207   ALT 13 04/17/2018 0903   ALT 26 05/09/2017 0812   ALT 19 02/20/2016 1207   BILITOT 0.4 04/17/2018 0903   BILITOT 0.44 02/20/2016 1207      Impression and Plan: Ms. Salah is a very pleasant 62 yo African American female with IgG kappa myeloma.   We now have to make another treatment changed  due to her M spike increasing.  It still is not that much of an increase and that she still does not have a significant myeloma burden.  I think at some point, we are going to have to get a bone marrow biopsy on her.  I know that she  has been reluctant to do this.  However, we are at a point where I think seeing her cytogenetics will help [place] out.  We will see how she does on the elotuzimab.  I will then try to add pomalidomide.  Again I think this might be difficult to incorporate.  She probably is going to need a very low dose of pomalidomide (1 or 2 mg).  I spent about 40 minutes with her today.  She is with her husband.  They are family to me.  I really love both of them.  It is been very enjoyable just having fellowship with them.  We will see about getting started next week.  I told her that we are going to have to be patient and see how the elotuzimab works.    [name], MD 12/9/201910:11 AM

## 2018-04-17 NOTE — Patient Instructions (Signed)
Implanted Port Home Guide An implanted port is a type of central line that is placed under the skin. Central lines are used to provide IV access when treatment or nutrition needs to be given through a person's veins. Implanted ports are used for long-term IV access. An implanted port may be placed because:  You need IV medicine that would be irritating to the small veins in your hands or arms.  You need long-term IV medicines, such as antibiotics.  You need IV nutrition for a long period.  You need frequent blood draws for lab tests.  You need dialysis.  Implanted ports are usually placed in the chest area, but they can also be placed in the upper arm, the abdomen, or the leg. An implanted port has two main parts:  Reservoir. The reservoir is round and will appear as a small, raised area under your skin. The reservoir is the part where a needle is inserted to give medicines or draw blood.  Catheter. The catheter is a thin, flexible tube that extends from the reservoir. The catheter is placed into a large vein. Medicine that is inserted into the reservoir goes into the catheter and then into the vein.  How will I care for my incision site? Do not get the incision site wet. Bathe or shower as directed by your health care provider. How is my port accessed? Special steps must be taken to access the port:  Before the port is accessed, a numbing cream can be placed on the skin. This helps numb the skin over the port site.  Your health care provider uses a sterile technique to access the port. ? Your health care provider must put on a mask and sterile gloves. ? The skin over your port is cleaned carefully with an antiseptic and allowed to dry. ? The port is gently pinched between sterile gloves, and a needle is inserted into the port.  Only "non-coring" port needles should be used to access the port. Once the port is accessed, a blood return should be checked. This helps ensure that the port  is in the vein and is not clogged.  If your port needs to remain accessed for a constant infusion, a clear (transparent) bandage will be placed over the needle site. The bandage and needle will need to be changed every week, or as directed by your health care provider.  Keep the bandage covering the needle clean and dry. Do not get it wet. Follow your health care provider's instructions on how to take a shower or bath while the port is accessed.  If your port does not need to stay accessed, no bandage is needed over the port.  What is flushing? Flushing helps keep the port from getting clogged. Follow your health care provider's instructions on how and when to flush the port. Ports are usually flushed with saline solution or a medicine called heparin. The need for flushing will depend on how the port is used.  If the port is used for intermittent medicines or blood draws, the port will need to be flushed: ? After medicines have been given. ? After blood has been drawn. ? As part of routine maintenance.  If a constant infusion is running, the port may not need to be flushed.  How long will my port stay implanted? The port can stay in for as long as your health care provider thinks it is needed. When it is time for the port to come out, surgery will be   done to remove it. The procedure is similar to the one performed when the port was put in. When should I seek immediate medical care? When you have an implanted port, you should seek immediate medical care if:  You notice a bad smell coming from the incision site.  You have swelling, redness, or drainage at the incision site.  You have more swelling or pain at the port site or the surrounding area.  You have a fever that is not controlled with medicine.  This information is not intended to replace advice given to you by your health care provider. Make sure you discuss any questions you have with your health care provider. Document  Released: 04/26/2005 Document Revised: 10/02/2015 Document Reviewed: 01/01/2013 Elsevier Interactive Patient Education  2017 [employer]  

## 2018-04-17 NOTE — Patient Instructions (Signed)
Elotuzumab injection What is this medicine? ELOTUZUMAB (el oh tooz ue mab) is a monoclonal antibody. It is used to treat multiple myeloma. This medicine may be used for other purposes; ask your health care provider or pharmacist if you have questions. COMMON BRAND NAME(S): Empliciti What should I tell my health care provider before I take this medicine? They need to know if you have any of these conditions: -hepatic disease -infection -an unusual or allergic reaction to elotuzumab, other medicines, foods, dyes, or preservatives -pregnant or trying to get pregnant -breast-feeding How should I use this medicine? This medicine is for infusion into a vein. It is given by a health care professional in a hospital or clinic setting. Talk to your pediatrician regarding the use of this medicine in children. Special care may be needed. Overdosage: If you think you have taken too much of this medicine contact a poison control center or emergency room at once. NOTE: This medicine is only for you. Do not share this medicine with others. What if I miss a dose? Keep appointments for follow-up doses as directed. It is important not to miss your dose. Call your doctor or health care professional if you are unable to keep an appointment. What may interact with this medicine? Interactions have not been studied. Give your health care provider a list of all the medicines, herbs, non-prescription drugs, or dietary supplements you use. Also tell them if you smoke, drink alcohol, or use illegal drugs. Some items may interact with your medicine. This list may not describe all possible interactions. Give your health care provider a list of all the medicines, herbs, non-prescription drugs, or dietary supplements you use. Also tell them if you smoke, drink alcohol, or use illegal drugs. Some items may interact with your medicine. What should I watch for while using this medicine? This drug may make you feel generally  unwell. Report any side effects. Continue your course of treatment even though you feel ill unless your doctor tells you to stop. This medicine can cause serious allergic reactions. To reduce your risk you may need to take medicine before treatment with this medicine. Take your medicine as directed. You may need blood work done while you are taking this medicine. This medicine can affect the results of some tests used to determine treatment response; extra tests may be needed to evaluate response. Talk to your doctor about your risk of cancer. You may be more at risk for certain types of cancers if you take this medicine. Women should inform their doctor if they wish to become pregnant or think they might be pregnant. There is a potential for serious side effects to an unborn child. Talk to your health care professional or pharmacist for more information. Do not breast-feed an infant while taking this medicine. What side effects may I notice from receiving this medicine? Side effects that you should report to your doctor or health care professional as soon as possible: -allergic reactions like skin rash, itching or hives, swelling of the face, lips, or tongue -breathing problems -dizziness -lightheaded -signs and symptoms of infection like fever or chills; cough; sore throat; pain or trouble passing urine -signs and symptoms of liver injury like dark yellow or brown urine; general ill feeling or flu-like symptoms; light-colored stools; loss of appetite; nausea; right upper belly pain; unusually weak or tired; yellowing of the eyes or skin Side effects that usually do not require medical attention (report to your doctor or health care professional if they continue   or are bothersome): -constipation -decreased appetite -diarrhea -pain, tingling, numbness in the hands or feet -tiredness This list may not describe all possible side effects. Call your doctor for medical advice about side effects. You  may report side effects to FDA at 1-800-FDA-1088. Where should I keep my medicine? Keep out of the reach of children. This drug is given in a hospital or clinic and will not be stored at home. NOTE: This sheet is a summary. It may not cover all possible information. If you have questions about this medicine, talk to your doctor, pharmacist, or health care provider.  2018 Elsevier/Gold Standard (2015-05-29 10:43:53)  

## 2018-04-17 NOTE — Progress Notes (Signed)
Ok to treat with labs  

## 2018-04-18 LAB — FERRITIN: Ferritin: 380 ng/mL — ABNORMAL HIGH (ref 11–307)

## 2018-04-18 LAB — IRON AND TIBC
Iron: 75 ug/dL (ref 41–142)
Saturation Ratios: 31 % (ref 21–57)
TIBC: 244 ug/dL (ref 236–444)
UIBC: 169 ug/dL (ref 120–384)

## 2018-04-18 LAB — BETA 2 MICROGLOBULIN, SERUM: Beta-2 Microglobulin: 2.8 mg/L — ABNORMAL HIGH (ref 0.6–2.4)

## 2018-04-18 LAB — KAPPA/LAMBDA LIGHT CHAINS
KAPPA, LAMDA LIGHT CHAIN RATIO: 6.45 — AB (ref 0.26–1.65)
Kappa free light chain: 40 mg/L — ABNORMAL HIGH (ref 3.3–19.4)
LAMDA FREE LIGHT CHAINS: 6.2 mg/L (ref 5.7–26.3)

## 2018-04-18 LAB — IGG, IGA, IGM
IGA: 37 mg/dL — AB (ref 87–352)
IGG (IMMUNOGLOBIN G), SERUM: 2378 mg/dL — AB (ref 700–1600)
IgM (Immunoglobulin M), Srm: 8 mg/dL — ABNORMAL LOW (ref 26–217)

## 2018-04-19 ENCOUNTER — Encounter: Payer: Self-pay | Admitting: Radiation Oncology

## 2018-04-19 NOTE — Progress Notes (Signed)
  Radiation Oncology         [phone] ________________________________  Name: Barbara Cook MRN: [id number]  Date: 04/19/2018  DOB: Sep 22, 1955  End of Treatment Note  Diagnosis:   Multiple myeloma not having achieved remission     Indication for treatment:  Palliative       Radiation treatment dates:   03/21/18 - 04/14/18  Site/dose:   Chest; 2Gy in 15 fractions for a total of 30 Gy  Beams/energy:   3D; 10X, 6X  Narrative: The patient tolerated radiation treatment relatively well.    Pt denied fatigue throughout treatments. Towards the end of treatment, pt reported improved breathing, but persisting SOB on exertion. Pt was prescribed Sonafine. Patient denied any skin changes. Overall the pt was without complaints. Left lower chest pain improved with treatment.  Plan: The patient has completed radiation treatment. The patient will return to radiation oncology clinic for routine followup in one month. I advised them to call or return sooner if they have any questions or concerns related to their recovery or treatment.  -----------------------------------  Blair Promise, PhD, MD  This document serves as a record of services personally performed by Gery Pray, MD. It was created on his behalf by Mary-Margaret Loma Messing, a trained medical scribe. The creation of this record is based on the scribe's personal observations and the provider's statements to them. This document has been checked and approved by the attending provider.

## 2018-04-20 LAB — PROTEIN ELECTROPHORESIS, SERUM, WITH REFLEX
A/G Ratio: 1 (ref 0.7–1.7)
ALPHA-2-GLOBULIN: 0.6 g/dL (ref 0.4–1.0)
Albumin ELP: 3.7 g/dL (ref 2.9–4.4)
Alpha-1-Globulin: 0.2 g/dL (ref 0.0–0.4)
Beta Globulin: 0.9 g/dL (ref 0.7–1.3)
Gamma Globulin: 2 g/dL — ABNORMAL HIGH (ref 0.4–1.8)
Globulin, Total: 3.7 g/dL (ref 2.2–3.9)
M-SPIKE, %: 1.6 g/dL — AB
SPEP Interpretation: 0
Total Protein ELP: 7.4 g/dL (ref 6.0–8.5)

## 2018-04-20 LAB — IMMUNOFIXATION REFLEX, SERUM
IgA: 35 mg/dL — ABNORMAL LOW (ref 87–352)
IgG (Immunoglobin G), Serum: 2481 mg/dL — ABNORMAL HIGH (ref 700–1600)
IgM (Immunoglobulin M), Srm: 8 mg/dL — ABNORMAL LOW (ref 26–217)

## 2018-04-21 ENCOUNTER — Other Ambulatory Visit: Payer: Self-pay | Admitting: *Deleted

## 2018-04-21 DIAGNOSIS — C9002 Multiple myeloma in relapse: Secondary | ICD-10-CM

## 2018-04-24 ENCOUNTER — Inpatient Hospital Stay: Payer: 59

## 2018-04-24 DIAGNOSIS — C9002 Multiple myeloma in relapse: Secondary | ICD-10-CM

## 2018-04-24 DIAGNOSIS — Z510 Encounter for antineoplastic radiation therapy: Secondary | ICD-10-CM | POA: Diagnosis not present

## 2018-04-24 DIAGNOSIS — C9000 Multiple myeloma not having achieved remission: Secondary | ICD-10-CM

## 2018-04-24 LAB — CMP (CANCER CENTER ONLY)
ALT: 14 U/L (ref 0–44)
ANION GAP: 6 (ref 5–15)
AST: 19 U/L (ref 15–41)
Albumin: 4 g/dL (ref 3.5–5.0)
Alkaline Phosphatase: 33 U/L — ABNORMAL LOW (ref 38–126)
BUN: 23 mg/dL (ref 8–23)
CO2: 26 mmol/L (ref 22–32)
Calcium: 9.8 mg/dL (ref 8.9–10.3)
Chloride: 106 mmol/L (ref 98–111)
Creatinine: 0.87 mg/dL (ref 0.44–1.00)
GFR, Est AFR Am: >60 mL/min (ref >60)
GFR, Estimated: >60 mL/min (ref >60)
Glucose, Bld: 88 mg/dL (ref 70–99)
Potassium: 4 mmol/L (ref 3.5–5.1)
Sodium: 138 mmol/L (ref 135–145)
Total Bilirubin: 0.5 mg/dL (ref 0.3–1.2)
Total Protein: 7.6 g/dL (ref 6.5–8.1)

## 2018-04-24 LAB — CBC WITH DIFFERENTIAL (CANCER CENTER ONLY)
Abs Immature Granulocytes: 0.01 10*3/uL (ref 0.00–0.07)
Basophils Absolute: 0 10*3/uL (ref 0.0–0.1)
Basophils Relative: 1 %
Eosinophils Absolute: 0.2 10*3/uL (ref 0.0–0.5)
Eosinophils Relative: 8 %
HCT: 31.2 % — ABNORMAL LOW (ref 36.0–46.0)
Hemoglobin: 10 g/dL — ABNORMAL LOW (ref 12.0–15.0)
Immature Granulocytes: 1 %
Lymphocytes Relative: 16 %
Lymphs Abs: 0.3 10*3/uL — ABNORMAL LOW (ref 0.7–4.0)
MCH: 30 pg (ref 26.0–34.0)
MCHC: 32.1 g/dL (ref 30.0–36.0)
MCV: 93.7 fL (ref 80.0–100.0)
Monocytes Absolute: 0.4 10*3/uL (ref 0.1–1.0)
Monocytes Relative: 21 %
Neutro Abs: 1.2 10*3/uL — ABNORMAL LOW (ref 1.7–7.7)
Neutrophils Relative %: 53 %
Platelet Count: 111 10*3/uL — ABNORMAL LOW (ref 150–400)
RBC: 3.33 MIL/uL — ABNORMAL LOW (ref 3.87–5.11)
RDW: 13.4 % (ref 11.5–15.5)
WBC: 2.2 10*3/uL — AB (ref 4.0–10.5)
nRBC: 0 % (ref 0.0–0.2)

## 2018-04-24 MED ORDER — DEXAMETHASONE SODIUM PHOSPHATE 10 MG/ML IJ SOLN
INTRAMUSCULAR | Status: AC
  Filled 2018-04-24: qty 1

## 2018-04-24 MED ORDER — PROCHLORPERAZINE MALEATE 10 MG PO TABS
10.0000 mg | ORAL_TABLET | Freq: Once | ORAL | Status: AC
  Administered 2018-04-24: 10 mg via ORAL

## 2018-04-24 MED ORDER — SODIUM CHLORIDE 0.9% FLUSH
10.0000 mL | INTRAVENOUS | Status: DC | PRN
  Administered 2018-04-24: 10 mL
  Filled 2018-04-24: qty 10

## 2018-04-24 MED ORDER — SODIUM CHLORIDE 0.9 % IV SOLN
Freq: Once | INTRAVENOUS | Status: DC
  Filled 2018-04-24: qty 250

## 2018-04-24 MED ORDER — PROCHLORPERAZINE MALEATE 10 MG PO TABS
ORAL_TABLET | ORAL | Status: AC
  Filled 2018-04-24: qty 1

## 2018-04-24 MED ORDER — ACETAMINOPHEN 325 MG PO TABS
650.0000 mg | ORAL_TABLET | Freq: Once | ORAL | Status: AC
  Administered 2018-04-24: 650 mg via ORAL

## 2018-04-24 MED ORDER — FAMOTIDINE IN NACL 20-0.9 MG/50ML-% IV SOLN
INTRAVENOUS | Status: AC
  Filled 2018-04-24: qty 50

## 2018-04-24 MED ORDER — HEPARIN SOD (PORK) LOCK FLUSH 100 UNIT/ML IV SOLN
500.0000 [IU] | Freq: Once | INTRAVENOUS | Status: AC | PRN
  Administered 2018-04-24: 500 [IU]
  Filled 2018-04-24: qty 5

## 2018-04-24 MED ORDER — DIPHENHYDRAMINE HCL 25 MG PO CAPS
ORAL_CAPSULE | ORAL | Status: AC
  Filled 2018-04-24: qty 1

## 2018-04-24 MED ORDER — DIPHENHYDRAMINE HCL 25 MG PO CAPS
25.0000 mg | ORAL_CAPSULE | Freq: Once | ORAL | Status: AC
  Administered 2018-04-24: 25 mg via ORAL

## 2018-04-24 MED ORDER — ACETAMINOPHEN 325 MG PO TABS
ORAL_TABLET | ORAL | Status: AC
  Filled 2018-04-24: qty 2

## 2018-04-24 MED ORDER — DEXAMETHASONE SODIUM PHOSPHATE 10 MG/ML IJ SOLN
8.0000 mg | Freq: Once | INTRAMUSCULAR | Status: AC
  Administered 2018-04-24: 8 mg via INTRAVENOUS

## 2018-04-24 MED ORDER — SODIUM CHLORIDE 0.9 % IV SOLN
10.4000 mg/kg | Freq: Once | INTRAVENOUS | Status: AC
  Administered 2018-04-24: 800 mg via INTRAVENOUS
  Filled 2018-04-24: qty 32

## 2018-04-24 MED ORDER — FAMOTIDINE IN NACL 20-0.9 MG/50ML-% IV SOLN
20.0000 mg | Freq: Once | INTRAVENOUS | Status: AC
  Administered 2018-04-24: 20 mg via INTRAVENOUS

## 2018-04-24 NOTE — Progress Notes (Signed)
OK to treat with ANC today per MD Marin Olp

## 2018-04-24 NOTE — Patient Instructions (Signed)
[facility] Discharge Instructions for Patients Receiving Chemotherapy  Today you received the following chemotherapy agents Empliciti.  To help prevent nausea and vomiting after your treatment, we encourage you to take your nausea medication as directed.   If you develop nausea and vomiting that is not controlled by your nausea medication, call the clinic.   BELOW ARE SYMPTOMS THAT SHOULD BE REPORTED IMMEDIATELY:  *FEVER GREATER THAN 100.5 F  *CHILLS WITH OR WITHOUT FEVER  NAUSEA AND VOMITING THAT IS NOT CONTROLLED WITH YOUR NAUSEA MEDICATION  *UNUSUAL SHORTNESS OF BREATH  *UNUSUAL BRUISING OR BLEEDING  TENDERNESS IN MOUTH AND THROAT WITH OR WITHOUT PRESENCE OF ULCERS  *URINARY PROBLEMS  *BOWEL PROBLEMS  UNUSUAL RASH Items with * indicate a potential emergency and should be followed up as soon as possible.  Feel free to call the clinic you have any questions or concerns. The clinic phone number is [phone].  Please show the CHEMO ALERT CARD at check-in to the Emergency Department and triage nurse.    

## 2018-04-27 ENCOUNTER — Inpatient Hospital Stay: Payer: 59

## 2018-04-27 ENCOUNTER — Other Ambulatory Visit: Payer: Self-pay

## 2018-04-27 VITALS — BP 145/83 | HR 82 | Temp 97.6°F | Resp 20

## 2018-04-27 DIAGNOSIS — M899 Disorder of bone, unspecified: Secondary | ICD-10-CM

## 2018-04-27 DIAGNOSIS — Z510 Encounter for antineoplastic radiation therapy: Secondary | ICD-10-CM | POA: Diagnosis not present

## 2018-04-27 DIAGNOSIS — C9000 Multiple myeloma not having achieved remission: Secondary | ICD-10-CM

## 2018-04-27 MED ORDER — SODIUM CHLORIDE 0.9% FLUSH
10.0000 mL | INTRAVENOUS | Status: DC | PRN
  Administered 2018-04-27: 10 mL via INTRAVENOUS
  Filled 2018-04-27: qty 10

## 2018-04-27 MED ORDER — SODIUM CHLORIDE 0.9 % IV SOLN
Freq: Once | INTRAVENOUS | Status: AC
  Administered 2018-04-27: 09:00:00 via INTRAVENOUS
  Filled 2018-04-27: qty 250

## 2018-04-27 MED ORDER — ZOLEDRONIC ACID 4 MG/100ML IV SOLN
4.0000 mg | Freq: Once | INTRAVENOUS | Status: AC
  Administered 2018-04-27: 4 mg via INTRAVENOUS
  Filled 2018-04-27: qty 100

## 2018-04-27 MED ORDER — HEPARIN SOD (PORK) LOCK FLUSH 100 UNIT/ML IV SOLN
500.0000 [IU] | Freq: Once | INTRAVENOUS | Status: AC
  Administered 2018-04-27: 500 [IU] via INTRAVENOUS
  Filled 2018-04-27: qty 5

## 2018-04-27 NOTE — Patient Instructions (Signed)

## 2018-04-28 ENCOUNTER — Other Ambulatory Visit: Payer: Self-pay | Admitting: *Deleted

## 2018-04-28 DIAGNOSIS — C9000 Multiple myeloma not having achieved remission: Secondary | ICD-10-CM

## 2018-05-01 ENCOUNTER — Inpatient Hospital Stay: Payer: 59

## 2018-05-01 DIAGNOSIS — C9000 Multiple myeloma not having achieved remission: Secondary | ICD-10-CM

## 2018-05-05 ENCOUNTER — Encounter: Payer: Self-pay | Admitting: Hematology & Oncology

## 2018-05-08 ENCOUNTER — Other Ambulatory Visit: Payer: Self-pay

## 2018-05-08 ENCOUNTER — Ambulatory Visit (HOSPITAL_BASED_OUTPATIENT_CLINIC_OR_DEPARTMENT_OTHER)
Admission: RE | Admit: 2018-05-08 | Discharge: 2018-05-08 | Disposition: A | Discharge status: Home / Self Care | Payer: 59 | Source: Ambulatory Visit | Attending: Hematology & Oncology | Admitting: Hematology & Oncology

## 2018-05-08 ENCOUNTER — Encounter: Payer: Self-pay | Admitting: Hematology & Oncology

## 2018-05-08 ENCOUNTER — Inpatient Hospital Stay: Payer: 59

## 2018-05-08 ENCOUNTER — Inpatient Hospital Stay (HOSPITAL_BASED_OUTPATIENT_CLINIC_OR_DEPARTMENT_OTHER): Payer: 59 | Admitting: Hematology & Oncology

## 2018-05-08 VITALS — BP 128/69 | HR 93 | Temp 98.4°F | Resp 18 | Wt 168.0 lb

## 2018-05-08 DIAGNOSIS — K589 Irritable bowel syndrome without diarrhea: Secondary | ICD-10-CM | POA: Diagnosis not present

## 2018-05-08 DIAGNOSIS — Z510 Encounter for antineoplastic radiation therapy: Secondary | ICD-10-CM | POA: Diagnosis not present

## 2018-05-08 DIAGNOSIS — C9002 Multiple myeloma in relapse: Secondary | ICD-10-CM | POA: Insufficient documentation

## 2018-05-08 DIAGNOSIS — R0781 Pleurodynia: Secondary | ICD-10-CM | POA: Diagnosis not present

## 2018-05-08 DIAGNOSIS — M899 Disorder of bone, unspecified: Secondary | ICD-10-CM

## 2018-05-08 DIAGNOSIS — R079 Chest pain, unspecified: Secondary | ICD-10-CM

## 2018-05-08 DIAGNOSIS — C9000 Multiple myeloma not having achieved remission: Secondary | ICD-10-CM | POA: Diagnosis not present

## 2018-05-08 LAB — CMP (CANCER CENTER ONLY)
ALK PHOS: 34 U/L — AB (ref 38–126)
ALT: 14 U/L (ref 0–44)
AST: 21 U/L (ref 15–41)
Albumin: 3.7 g/dL (ref 3.5–5.0)
Anion gap: 4 — ABNORMAL LOW (ref 5–15)
BUN: 18 mg/dL (ref 8–23)
CO2: 26 mmol/L (ref 22–32)
Calcium: 9.1 mg/dL (ref 8.9–10.3)
Chloride: 105 mmol/L (ref 98–111)
Creatinine: 1.02 mg/dL — ABNORMAL HIGH (ref 0.44–1.00)
GFR, Est AFR Am: >60 mL/min (ref >60)
GFR, Estimated: 58 mL/min — ABNORMAL LOW (ref >60)
Glucose, Bld: 92 mg/dL (ref 70–99)
Potassium: 4.1 mmol/L (ref 3.5–5.1)
SODIUM: 135 mmol/L (ref 135–145)
Total Bilirubin: 0.5 mg/dL (ref 0.3–1.2)
Total Protein: 8.5 g/dL — ABNORMAL HIGH (ref 6.5–8.1)

## 2018-05-08 LAB — CBC WITH DIFFERENTIAL (CANCER CENTER ONLY)
Abs Immature Granulocytes: 0.01 10*3/uL (ref 0.00–0.07)
Basophils Absolute: 0 10*3/uL (ref 0.0–0.1)
Basophils Relative: 0 %
Eosinophils Absolute: 0.1 10*3/uL (ref 0.0–0.5)
Eosinophils Relative: 4 %
HCT: 31.2 % — ABNORMAL LOW (ref 36.0–46.0)
Hemoglobin: 9.9 g/dL — ABNORMAL LOW (ref 12.0–15.0)
Immature Granulocytes: 0 %
Lymphocytes Relative: 25 %
Lymphs Abs: 0.7 10*3/uL (ref 0.7–4.0)
MCH: 29.5 pg (ref 26.0–34.0)
MCHC: 31.7 g/dL (ref 30.0–36.0)
MCV: 92.9 fL (ref 80.0–100.0)
Monocytes Absolute: 0.4 10*3/uL (ref 0.1–1.0)
Monocytes Relative: 14 %
Neutro Abs: 1.5 10*3/uL — ABNORMAL LOW (ref 1.7–7.7)
Neutrophils Relative %: 57 %
Platelet Count: 133 10*3/uL — ABNORMAL LOW (ref 150–400)
RBC: 3.36 MIL/uL — ABNORMAL LOW (ref 3.87–5.11)
RDW: 13.6 % (ref 11.5–15.5)
WBC: 2.7 10*3/uL — AB (ref 4.0–10.5)
nRBC: 0 % (ref 0.0–0.2)

## 2018-05-08 LAB — IRON AND TIBC
Iron: 70 ug/dL (ref 41–142)
Saturation Ratios: 29 % (ref 21–57)
TIBC: 238 ug/dL (ref 236–444)
UIBC: 168 ug/dL (ref 120–384)

## 2018-05-08 LAB — FERRITIN: Ferritin: 550 ng/mL — ABNORMAL HIGH (ref 11–307)

## 2018-05-08 LAB — RETICULOCYTES
Immature Retic Fract: 12.8 % (ref 2.3–15.9)
RBC.: 3.36 MIL/uL — ABNORMAL LOW (ref 3.87–5.11)
Retic Count, Absolute: 39.6 10*3/uL (ref 19.0–186.0)
Retic Ct Pct: 1.2 % (ref 0.4–3.1)

## 2018-05-08 MED ORDER — DEXAMETHASONE SODIUM PHOSPHATE 10 MG/ML IJ SOLN
8.0000 mg | Freq: Once | INTRAMUSCULAR | Status: AC
  Administered 2018-05-08: 8 mg via INTRAVENOUS

## 2018-05-08 MED ORDER — FAMOTIDINE IN NACL 20-0.9 MG/50ML-% IV SOLN
INTRAVENOUS | Status: AC
  Filled 2018-05-08: qty 50

## 2018-05-08 MED ORDER — DEXAMETHASONE SODIUM PHOSPHATE 10 MG/ML IJ SOLN
INTRAMUSCULAR | Status: AC
  Filled 2018-05-08: qty 1

## 2018-05-08 MED ORDER — DARBEPOETIN ALFA 300 MCG/0.6ML IJ SOSY: 300.0000 ug | PREFILLED_SYRINGE | Freq: Once | INTRAMUSCULAR | Status: DC

## 2018-05-08 MED ORDER — SODIUM CHLORIDE 0.9 % IV SOLN
Freq: Once | INTRAVENOUS | Status: AC
  Administered 2018-05-08: 11:00:00 via INTRAVENOUS
  Filled 2018-05-08: qty 250

## 2018-05-08 MED ORDER — SODIUM CHLORIDE 0.9% FLUSH
10.0000 mL | INTRAVENOUS | Status: DC | PRN
  Administered 2018-05-08: 10 mL
  Filled 2018-05-08: qty 10

## 2018-05-08 MED ORDER — PROCHLORPERAZINE MALEATE 10 MG PO TABS
10.0000 mg | ORAL_TABLET | Freq: Once | ORAL | Status: AC
  Administered 2018-05-08: 10 mg via ORAL

## 2018-05-08 MED ORDER — SODIUM CHLORIDE 0.9 % IV SOLN
800.0000 mg | Freq: Once | INTRAVENOUS | Status: AC
  Administered 2018-05-08: 800 mg via INTRAVENOUS
  Filled 2018-05-08: qty 32

## 2018-05-08 MED ORDER — ACETAMINOPHEN 325 MG PO TABS
ORAL_TABLET | ORAL | Status: AC
  Filled 2018-05-08: qty 2

## 2018-05-08 MED ORDER — HEPARIN SOD (PORK) LOCK FLUSH 100 UNIT/ML IV SOLN
500.0000 [IU] | Freq: Once | INTRAVENOUS | Status: AC | PRN
  Administered 2018-05-08: 500 [IU]
  Filled 2018-05-08: qty 5

## 2018-05-08 MED ORDER — DARBEPOETIN ALFA 300 MCG/0.6ML IJ SOSY
PREFILLED_SYRINGE | INTRAMUSCULAR | Status: AC
  Filled 2018-05-08: qty 0.6

## 2018-05-08 MED ORDER — ACETAMINOPHEN 325 MG PO TABS
650.0000 mg | ORAL_TABLET | Freq: Once | ORAL | Status: AC
  Administered 2018-05-08: 650 mg via ORAL

## 2018-05-08 MED ORDER — FAMOTIDINE IN NACL 20-0.9 MG/50ML-% IV SOLN
20.0000 mg | Freq: Once | INTRAVENOUS | Status: AC
  Administered 2018-05-08: 20 mg via INTRAVENOUS

## 2018-05-08 MED ORDER — DIPHENHYDRAMINE HCL 25 MG PO CAPS
25.0000 mg | ORAL_CAPSULE | Freq: Once | ORAL | Status: AC
  Administered 2018-05-08: 25 mg via ORAL

## 2018-05-08 MED ORDER — PROCHLORPERAZINE MALEATE 10 MG PO TABS
ORAL_TABLET | ORAL | Status: AC
  Filled 2018-05-08: qty 1

## 2018-05-08 MED ORDER — DIPHENHYDRAMINE HCL 25 MG PO CAPS
ORAL_CAPSULE | ORAL | Status: AC
  Filled 2018-05-08: qty 1

## 2018-05-08 NOTE — Progress Notes (Signed)
Hematology and Oncology Follow Up Visit  Barbara Cook [id number] [birth year] 62 y.o. 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  She did have a nice Christmas.  Last time she was here, we cannot treat her because of her irritable bowel syndrome.  She got this after she had Zometa.  She is was having some problems after the Zometa.  She was having some fever.  She is having some pain over on the right side.  I think that if we do Zometa again, we will have to be over 45 minutes to an hour.  It is still way too early to tell how she is doing with the elotuzimab.  I think she is only had a couple doses.  She has had no problems with cough.  There is no nausea or vomiting.  She is had no diarrhea aside that with the irritable bowel.  There is been no rashes.  She is had no leg swelling.  Overall, her performance status is ECOG 1-2.    Medications:  Allergies as of 05/08/2018      Reactions   Barium-containing Compounds Diarrhea   Oral Redicat gives pt severe diarrhea, causing delay in scan time, see previous ct scan notes for details   Codeine Palpitations      Medication List       Accurate as of May 08, 2018  9:51 AM. Always use your most recent med list.        acetaminophen 500 MG tablet Commonly known as:  TYLENOL Take 500 mg by mouth  every 6 (six) hours as needed for mild pain or headache.   acyclovir 400 MG tablet Commonly known as:  ZOVIRAX Take 1 tablet (400 mg total) by mouth daily.   B-6 250 MG Tabs TAKE 1 TABLET (250 MG TOTAL) BY MOUTH DAILY.   dexamethasone 4 MG tablet Commonly known as:  DECADRON Take 5 tablets (20mg ) between 3 and 24 hours prior to chemotherapy. On non-chemotherapy days 8 and 22 , take 5 tablets (20 mg). Repeat every 28 days.   HYDROcodone-acetaminophen 5-325 MG tablet Commonly known as:  NORCO/VICODIN Take 1 tablet by mouth every 4 (four) hours as needed.   hyoscyamine 0.125 MG SL tablet Commonly known as:  LEVSIN SL PLACE 1 TABLET (0.125 MG TOTAL) UNDER THE TONGUE EVERY 4 (FOUR) HOURS AS NEEDED.   lidocaine-prilocaine cream Commonly known as:  EMLA Apply to affected area once   LORazepam 0.5 MG tablet Commonly known as:  ATIVAN Take 1 tablet (0.5 mg total) by mouth every 6 (six) hours as needed (Nausea or vomiting).   magnesium oxide 400 (241.3 Mg) MG tablet Commonly known as:  MAG-OX TAKE 1 TABLET BY MOUTH TWICE A DAY   metroNIDAZOLE 500 MG tablet Commonly known as:  FLAGYL Take 1 tablet (500 mg total) by mouth 3 (three) times daily.   montelukast  10 MG tablet Commonly known as:  SINGULAIR Take 1 tablet (10 mg total) by mouth at bedtime.   multivitamin tablet Take 1 tablet by mouth every evening.   ondansetron 8 MG tablet Commonly known as:  ZOFRAN TAKE 1 TABLET BY MOUTH TWICE A DAY FOR NAUSEA AND VOMITING 1 TABLET 1 HOUR PRIOR TO CHEMOTHERAPY   ondansetron 8 MG tablet Commonly known as:  ZOFRAN Take 1 tablet (8 mg total) by mouth 2 (two) times daily as needed (Nausea or vomiting).   pantoprazole 40 MG tablet Commonly known as:  PROTONIX Take 1 tablet (40 mg total) by mouth daily.   polyethylene glycol powder powder Commonly known as:  GLYCOLAX/MIRALAX 1 capful daily as needed   prochlorperazine 10 MG tablet Commonly known as:  COMPAZINE Take 1 tablet (10  mg total) by mouth every 6 (six) hours as needed for nausea or vomiting.   prochlorperazine 10 MG tablet Commonly known as:  COMPAZINE Take 1 tablet (10 mg total) by mouth every 6 (six) hours as needed (Nausea or vomiting).   torsemide 20 MG tablet Commonly known as:  DEMADEX Take 1 tablet (20 mg total) by mouth daily as needed.   Vitamin D (Ergocalciferol) 1.25 MG (50000 UT) Caps capsule Commonly known as:  DRISDOL TAKE 1 CAPSULE (50,000 UNITS TOTAL) BY MOUTH ONCE A WEEK.       Allergies:  Allergies  Allergen Reactions  . Barium-Containing Compounds Diarrhea    Oral Redicat gives pt severe diarrhea, causing delay in scan time, see previous ct scan notes for details  . Codeine Palpitations    Past Medical History, Surgical history, Social history, and Family History were reviewed and updated.  Review of Systems: Review of Systems  Constitutional: Negative.   HENT: Negative.   Eyes: Negative.   Respiratory: Negative.   Cardiovascular: Negative.   Gastrointestinal: Negative.   Genitourinary: Negative.   Musculoskeletal: Positive for myalgias.  Skin: Negative.   Neurological: Negative.   Endo/Heme/Allergies: Negative.   Psychiatric/Behavioral: Negative.    Marland Kitchen   Physical Exam:  weight is 168 lb (76.2 kg). Her oral temperature is 98.4 F (36.9 C). Her blood pressure is 128/69 and her pulse is 93. Her respiration is 18 and oxygen saturation is 98%.   Wt Readings from Last 3 Encounters:  05/08/18 168 lb (76.2 kg)  03/22/18 168 lb (76.2 kg)  03/14/18 168 lb 6 oz (76.4 kg)    Physical Exam Vitals signs reviewed.  HENT:     Head: Normocephalic and atraumatic.  Eyes:     Pupils: Pupils are equal, round, and reactive to light.  Neck:     Musculoskeletal: Normal range of motion.  Cardiovascular:     Rate and Rhythm: Normal rate and regular rhythm.     Heart sounds: Normal heart sounds.  Pulmonary:     Effort: Pulmonary effort is normal.     Breath sounds: Normal  breath sounds.  Abdominal:     General: Bowel sounds are normal.     Palpations: Abdomen is soft.  Musculoskeletal: Normal range of motion.        General: No tenderness or deformity.  Lymphadenopathy:     Cervical: No cervical adenopathy.  Skin:    General: Skin is warm and dry.     Findings: No erythema or rash.  Neurological:     Mental Status: She is alert and oriented to person, place, and time.  Psychiatric:        Behavior: Behavior normal.  Thought Content: Thought content normal.        Judgment: Judgment normal.      Lab Results  Component Value Date   WBC 2.7 (L) 05/08/2018   HGB 9.9 (L) 05/08/2018   HCT 31.2 (L) 05/08/2018   MCV 92.9 05/08/2018   PLT 133 (L) 05/08/2018   Lab Results  Component Value Date   FERRITIN 380 (H) 04/17/2018   IRON 75 04/17/2018   TIBC 244 04/17/2018   UIBC 169 04/17/2018   IRONPCTSAT 31 04/17/2018   Lab Results  Component Value Date   RETICCTPCT 1.2 05/08/2018   RBC 3.36 (L) 05/08/2018   RBC 3.36 (L) 05/08/2018   RETICCTABS 35.9 12/25/2014   Lab Results  Component Value Date   KPAFRELGTCHN 40.0 (H) 04/17/2018   LAMBDASER 6.2 04/17/2018   KAPLAMBRATIO 6.45 (H) 04/17/2018   Lab Results  Component Value Date   IGGSERUM 2,378 (H) 04/17/2018   IGGSERUM 2,481 (H) 04/17/2018   IGA 37 (L) 04/17/2018   IGA 35 (L) 04/17/2018   IGMSERUM 8 (L) 04/17/2018   IGMSERUM 8 (L) 04/17/2018   Lab Results  Component Value Date   TOTALPROTELP 7.4 04/17/2018   ALBUMINELP 3.7 04/17/2018   A1GS 0.2 04/17/2018   A2GS 0.6 04/17/2018   BETS 0.9 04/17/2018   BETA2SER 0.3 05/07/2015   GAMS 2.0 (H) 04/17/2018   MSPIKE 1.6 (H) 04/17/2018   SPEI Comment 02/06/2018     Chemistry      Component Value Date/Time   NA 138 04/24/2018 0823   NA 143 05/09/2017 0812   NA 139 02/20/2016 1207   K 4.0 04/24/2018 0823   K 4.0 05/09/2017 0812   K 3.8 02/20/2016 1207   CL 106 04/24/2018 0823   CL 108 05/09/2017 0812   CO2 26 04/24/2018  0823   CO2 26 05/09/2017 0812   CO2 29 02/20/2016 1207   BUN 23 04/24/2018 0823   BUN 13 05/09/2017 0812   BUN 22.3 02/20/2016 1207   CREATININE 0.87 04/24/2018 0823   CREATININE 0.9 05/09/2017 0812   CREATININE 1.3 (H) 02/20/2016 1207      Component Value Date/Time   CALCIUM 9.8 04/24/2018 0823   CALCIUM 8.3 05/09/2017 0812   CALCIUM 9.7 02/20/2016 1207   ALKPHOS 33 (L) 04/24/2018 0823   ALKPHOS 123 (H) 05/09/2017 0812   ALKPHOS 62 02/20/2016 1207   AST 19 04/24/2018 0823   AST 27 02/20/2016 1207   ALT 14 04/24/2018 0823   ALT 26 05/09/2017 0812   ALT 19 02/20/2016 1207   BILITOT 0.5 04/24/2018 0823   BILITOT 0.44 02/20/2016 1207      Impression and Plan: Barbara Cook is a very pleasant 62 yo African American female with IgG kappa myeloma.   We will try to continue to pursue the elotuzimab.  Hopefully she will come back weekly for this.  I really believe that this is her best chance of trying to improve the myeloma.  If we do not see a response, then I am going to clearly have to get a bone marrow test on her.  Again, she has been very reluctant to do this which I understand.  I am going to get a chest x-ray on her so we can see about this pain on the right side.  We really have to have her come back weekly for evaluation.  She just has a weaker constitution and just has a lot of problems with side effects.      Collier Salina  Oletha Cruel, MD 12/30/20199:51 AM

## 2018-05-08 NOTE — Patient Instructions (Signed)

## 2018-05-09 ENCOUNTER — Telehealth: Payer: Self-pay | Admitting: *Deleted

## 2018-05-09 LAB — KAPPA/LAMBDA LIGHT CHAINS
Kappa free light chain: 56.8 mg/L — ABNORMAL HIGH (ref 3.3–19.4)
Kappa, lambda light chain ratio: 9.96 — ABNORMAL HIGH (ref 0.26–1.65)
Lambda free light chains: 5.7 mg/L (ref 5.7–26.3)

## 2018-05-09 LAB — IGG, IGA, IGM
IgA: 33 mg/dL — ABNORMAL LOW (ref 87–352)
IgG (Immunoglobin G), Serum: 3182 mg/dL — ABNORMAL HIGH (ref 700–1600)
IgM (Immunoglobulin M), Srm: 6 mg/dL — ABNORMAL LOW (ref 26–217)

## 2018-05-09 NOTE — Telephone Encounter (Addendum)
Patient is aware of results  ----- Message from [name], MD sent at 05/08/2018  4:37 PM EST ----- Call- the CXR is ok. Nothing with the bones is seen!!!  Barbara Cook

## 2018-05-09 NOTE — Telephone Encounter (Signed)
CALLED PATIENT TO ALTER FU DUE TO DR. KINARD BEING IN THE OR, RESCHEDULED FOR 05-18-18 @ 11:45 AM, SPOKE WITH PATIENT AND SHE IS AWARE OF THIS DAY AND TIME

## 2018-05-11 ENCOUNTER — Encounter: Payer: Self-pay | Admitting: Hematology & Oncology

## 2018-05-15 ENCOUNTER — Inpatient Hospital Stay: Payer: BLUE CROSS/BLUE SHIELD

## 2018-05-15 ENCOUNTER — Ambulatory Visit: Payer: 59 | Admitting: Hematology & Oncology

## 2018-05-15 ENCOUNTER — Ambulatory Visit: Payer: Self-pay | Admitting: Radiation Oncology

## 2018-05-15 ENCOUNTER — Ambulatory Visit: Payer: 59

## 2018-05-16 ENCOUNTER — Ambulatory Visit: Payer: 59 | Admitting: Radiation Oncology

## 2018-05-17 ENCOUNTER — Encounter: Payer: Self-pay | Admitting: Hematology & Oncology

## 2018-05-18 ENCOUNTER — Ambulatory Visit: Payer: 59 | Admitting: Radiation Oncology

## 2018-05-19 ENCOUNTER — Other Ambulatory Visit: Payer: Self-pay | Admitting: *Deleted

## 2018-05-19 DIAGNOSIS — C9002 Multiple myeloma in relapse: Secondary | ICD-10-CM

## 2018-05-22 ENCOUNTER — Inpatient Hospital Stay: Payer: BLUE CROSS/BLUE SHIELD

## 2018-05-22 ENCOUNTER — Inpatient Hospital Stay: Payer: BLUE CROSS/BLUE SHIELD | Attending: Hematology & Oncology

## 2018-05-22 DIAGNOSIS — C9002 Multiple myeloma in relapse: Secondary | ICD-10-CM

## 2018-05-22 DIAGNOSIS — N189 Chronic kidney disease, unspecified: Secondary | ICD-10-CM | POA: Diagnosis present

## 2018-05-22 DIAGNOSIS — Z5112 Encounter for antineoplastic immunotherapy: Secondary | ICD-10-CM | POA: Insufficient documentation

## 2018-05-22 DIAGNOSIS — R779 Abnormality of plasma protein, unspecified: Secondary | ICD-10-CM | POA: Insufficient documentation

## 2018-05-22 DIAGNOSIS — C9000 Multiple myeloma not having achieved remission: Secondary | ICD-10-CM | POA: Diagnosis present

## 2018-05-22 DIAGNOSIS — D631 Anemia in chronic kidney disease: Secondary | ICD-10-CM | POA: Insufficient documentation

## 2018-05-22 LAB — CMP (CANCER CENTER ONLY)
ALT: 13 U/L (ref 0–44)
AST: 26 U/L (ref 15–41)
Albumin: 3.5 g/dL (ref 3.5–5.0)
Alkaline Phosphatase: 27 U/L — ABNORMAL LOW (ref 38–126)
Anion gap: 4 — ABNORMAL LOW (ref 5–15)
BUN: 19 mg/dL (ref 8–23)
CO2: 25 mmol/L (ref 22–32)
CREATININE: 0.91 mg/dL (ref 0.44–1.00)
Calcium: 9.5 mg/dL (ref 8.9–10.3)
Chloride: 107 mmol/L (ref 98–111)
GFR, Estimated: >60 mL/min (ref >60)
Glucose, Bld: 92 mg/dL (ref 70–99)
Potassium: 3.7 mmol/L (ref 3.5–5.1)
Sodium: 136 mmol/L (ref 135–145)
Total Bilirubin: 0.5 mg/dL (ref 0.3–1.2)
Total Protein: 9 g/dL — ABNORMAL HIGH (ref 6.5–8.1)

## 2018-05-22 LAB — CBC WITH DIFFERENTIAL (CANCER CENTER ONLY)
Abs Immature Granulocytes: 0.01 10*3/uL (ref 0.00–0.07)
BASOS ABS: 0 10*3/uL (ref 0.0–0.1)
Basophils Relative: 1 %
Eosinophils Absolute: 0.1 10*3/uL (ref 0.0–0.5)
Eosinophils Relative: 3 %
HCT: 28.4 % — ABNORMAL LOW (ref 36.0–46.0)
Hemoglobin: 9.1 g/dL — ABNORMAL LOW (ref 12.0–15.0)
Immature Granulocytes: 0 %
Lymphocytes Relative: 28 %
Lymphs Abs: 0.7 10*3/uL (ref 0.7–4.0)
MCH: 29.9 pg (ref 26.0–34.0)
MCHC: 32 g/dL (ref 30.0–36.0)
MCV: 93.4 fL (ref 80.0–100.0)
Monocytes Absolute: 0.4 10*3/uL (ref 0.1–1.0)
Monocytes Relative: 16 %
NEUTROS ABS: 1.3 10*3/uL — AB (ref 1.7–7.7)
Neutrophils Relative %: 52 %
PLATELETS: 106 10*3/uL — AB (ref 150–400)
RBC: 3.04 MIL/uL — ABNORMAL LOW (ref 3.87–5.11)
RDW: 14.1 % (ref 11.5–15.5)
WBC Count: 2.5 10*3/uL — ABNORMAL LOW (ref 4.0–10.5)
nRBC: 0 % (ref 0.0–0.2)

## 2018-05-22 LAB — LACTATE DEHYDROGENASE: LDH: 340 U/L — ABNORMAL HIGH (ref 98–192)

## 2018-05-22 MED ORDER — ACETAMINOPHEN 325 MG PO TABS
650.0000 mg | ORAL_TABLET | Freq: Once | ORAL | Status: AC
  Administered 2018-05-22: 650 mg via ORAL

## 2018-05-22 MED ORDER — DIPHENHYDRAMINE HCL 25 MG PO CAPS
ORAL_CAPSULE | ORAL | Status: AC
  Filled 2018-05-22: qty 1

## 2018-05-22 MED ORDER — PROCHLORPERAZINE MALEATE 10 MG PO TABS
ORAL_TABLET | ORAL | Status: AC
  Filled 2018-05-22: qty 1

## 2018-05-22 MED ORDER — PROCHLORPERAZINE MALEATE 10 MG PO TABS
10.0000 mg | ORAL_TABLET | Freq: Once | ORAL | Status: AC
  Administered 2018-05-22: 10 mg via ORAL

## 2018-05-22 MED ORDER — FAMOTIDINE IN NACL 20-0.9 MG/50ML-% IV SOLN
20.0000 mg | Freq: Once | INTRAVENOUS | Status: AC
  Administered 2018-05-22: 20 mg via INTRAVENOUS

## 2018-05-22 MED ORDER — SODIUM CHLORIDE 0.9% FLUSH
10.0000 mL | INTRAVENOUS | Status: DC | PRN
  Administered 2018-05-22: 10 mL
  Filled 2018-05-22: qty 10

## 2018-05-22 MED ORDER — DIPHENHYDRAMINE HCL 25 MG PO CAPS
25.0000 mg | ORAL_CAPSULE | Freq: Once | ORAL | Status: AC
  Administered 2018-05-22: 25 mg via ORAL

## 2018-05-22 MED ORDER — SODIUM CHLORIDE 0.9 % IV SOLN
Freq: Once | INTRAVENOUS | Status: AC
  Administered 2018-05-22: 10:00:00 via INTRAVENOUS
  Filled 2018-05-22: qty 250

## 2018-05-22 MED ORDER — FAMOTIDINE IN NACL 20-0.9 MG/50ML-% IV SOLN
INTRAVENOUS | Status: AC
  Filled 2018-05-22: qty 50

## 2018-05-22 MED ORDER — DEXAMETHASONE SODIUM PHOSPHATE 10 MG/ML IJ SOLN
INTRAMUSCULAR | Status: AC
  Filled 2018-05-22: qty 1

## 2018-05-22 MED ORDER — SODIUM CHLORIDE 0.9 % IV SOLN
10.4000 mg/kg | Freq: Once | INTRAVENOUS | Status: AC
  Administered 2018-05-22: 800 mg via INTRAVENOUS
  Filled 2018-05-22: qty 32

## 2018-05-22 MED ORDER — DEXAMETHASONE SODIUM PHOSPHATE 10 MG/ML IJ SOLN
8.0000 mg | Freq: Once | INTRAMUSCULAR | Status: AC
  Administered 2018-05-22: 8 mg via INTRAVENOUS

## 2018-05-22 MED ORDER — HEPARIN SOD (PORK) LOCK FLUSH 100 UNIT/ML IV SOLN
500.0000 [IU] | Freq: Once | INTRAVENOUS | Status: AC | PRN
  Administered 2018-05-22: 500 [IU]
  Filled 2018-05-22: qty 5

## 2018-05-22 MED ORDER — ACETAMINOPHEN 325 MG PO TABS
ORAL_TABLET | ORAL | Status: AC
  Filled 2018-05-22: qty 2

## 2018-05-22 NOTE — Patient Instructions (Signed)
Implanted Port Insertion, Care After  This sheet gives you information about how to care for yourself after your procedure. Your health care provider may also give you more specific instructions. If you have problems or questions, contact your health care provider.  What can I expect after the procedure?  After the procedure, it is common to have:  · Discomfort at the port insertion site.  · Bruising on the skin over the port. This should improve over 3-4 days.  Follow these instructions at home:  Port care  · After your port is placed, you will get a manufacturer's information card. The card has information about your port. Keep this card with you at all times.  · Take care of the port as told by your health care provider. Ask your health care provider if you or a family member can get training for taking care of the port at home. A home health care nurse may also take care of the port.  · Make sure to remember what type of port you have.  Incision care         · Follow instructions from your health care provider about how to take care of your port insertion site. Make sure you:  ? Wash your hands with soap and water before and after you change your bandage (dressing). If soap and water are not available, use hand sanitizer.  ? Change your dressing as told by your health care provider.  ? Leave stitches (sutures), skin glue, or adhesive strips in place. These skin closures may need to stay in place for 2 weeks or longer. If adhesive strip edges start to loosen and curl up, you may trim the loose edges. Do not remove adhesive strips completely unless your health care provider tells you to do that.  · Check your port insertion site every day for signs of infection. Check for:  ? Redness, swelling, or pain.  ? Fluid or blood.  ? Warmth.  ? Pus or a bad smell.  Activity  · Return to your normal activities as told by your health care provider. Ask your health care provider what activities are safe for you.  · Do not  lift anything that is heavier than 10 lb (4.5 kg), or the limit that you are told, until your health care provider says that it is safe.  General instructions  · Take over-the-counter and prescription medicines only as told by your health care provider.  · Do not take baths, swim, or use a hot tub until your health care provider approves. Ask your health care provider if you may take showers. You may only be allowed to take sponge baths.  · Do not drive for 24 hours if you were given a sedative during your procedure.  · Wear a medical alert bracelet in case of an emergency. This will tell any health care providers that you have a port.  · Keep all follow-up visits as told by your health care provider. This is important.  Contact a health care provider if:  · You cannot flush your port with saline as directed, or you cannot draw blood from the port.  · You have a fever or chills.  · You have redness, swelling, or pain around your port insertion site.  · You have fluid or blood coming from your port insertion site.  · Your port insertion site feels warm to the touch.  · You have pus or a bad smell coming from the port   insertion site.  Get help right away if:  · You have chest pain or shortness of breath.  · You have bleeding from your port that you cannot control.  Summary  · Take care of the port as told by your health care provider. Keep the manufacturer's information card with you at all times.  · Change your dressing as told by your health care provider.  · Contact a health care provider if you have a fever or chills or if you have redness, swelling, or pain around your port insertion site.  · Keep all follow-up visits as told by your health care provider.  This information is not intended to replace advice given to you by your health care provider. Make sure you discuss any questions you have with your health care provider.  Document Released: 02/14/2013 Document Revised: 11/22/2017 Document Reviewed:  11/22/2017  Elsevier Interactive Patient Education © 2019 [employer]

## 2018-05-22 NOTE — Progress Notes (Signed)
After reviewing labwork, OK to treat per Dr. Marin Olp

## 2018-05-22 NOTE — Patient Instructions (Signed)
Elotuzumab injection What is this medicine? ELOTUZUMAB (el oh tooz ue mab) is a monoclonal antibody. It is used to treat multiple myeloma. This medicine may be used for other purposes; ask your health care provider or pharmacist if you have questions. COMMON BRAND NAME(S): Empliciti What should I tell my health care provider before I take this medicine? They need to know if you have any of these conditions: -hepatic disease -infection -an unusual or allergic reaction to elotuzumab, other medicines, foods, dyes, or preservatives -pregnant or trying to get pregnant -breast-feeding How should I use this medicine? This medicine is for infusion into a vein. It is given by a health care professional in a hospital or clinic setting. Talk to your pediatrician regarding the use of this medicine in children. Special care may be needed. Overdosage: If you think you have taken too much of this medicine contact a poison control center or emergency room at once. NOTE: This medicine is only for you. Do not share this medicine with others. What if I miss a dose? Keep appointments for follow-up doses as directed. It is important not to miss your dose. Call your doctor or health care professional if you are unable to keep an appointment. What may interact with this medicine? Interactions have not been studied. Give your health care provider a list of all the medicines, herbs, non-prescription drugs, or dietary supplements you use. Also tell them if you smoke, drink alcohol, or use illegal drugs. Some items may interact with your medicine. This list may not describe all possible interactions. Give your health care provider a list of all the medicines, herbs, non-prescription drugs, or dietary supplements you use. Also tell them if you smoke, drink alcohol, or use illegal drugs. Some items may interact with your medicine. What should I watch for while using this medicine? This drug may make you feel generally  unwell. Report any side effects. Continue your course of treatment even though you feel ill unless your doctor tells you to stop. This medicine can cause serious allergic reactions. To reduce your risk you may need to take medicine before treatment with this medicine. Take your medicine as directed. You may need blood work done while you are taking this medicine. This medicine can affect the results of some tests used to determine treatment response; extra tests may be needed to evaluate response. Talk to your doctor about your risk of cancer. You may be more at risk for certain types of cancers if you take this medicine. Women should inform their doctor if they wish to become pregnant or think they might be pregnant. There is a potential for serious side effects to an unborn child. Talk to your health care professional or pharmacist for more information. Do not breast-feed an infant while taking this medicine. What side effects may I notice from receiving this medicine? Side effects that you should report to your doctor or health care professional as soon as possible: -allergic reactions like skin rash, itching or hives, swelling of the face, lips, or tongue -breathing problems -chest pain -dizziness -lightheaded -signs and symptoms of high blood sugar such as dry mouth, dry skin, fruity breath, stomach pain, increased hunger, increased thirst, increased urination -signs and symptoms of infection like fever or chills; cough; sore throat; pain or trouble passing urine -signs and symptoms of liver injury like dark yellow or brown urine; general ill feeling or flu-like symptoms; light-colored stools; loss of appetite; nausea; right upper belly pain; unusually weak or tired; yellowing  of the eyes or skin Side effects that usually do not require medical attention (report to your doctor or health care professional if they continue or are bothersome): -constipation -decreased appetite -diarrhea -pain,  tingling, numbness in the hands or feet -tiredness This list may not describe all possible side effects. Call your doctor for medical advice about side effects. You may report side effects to FDA at 1-800-FDA-1088. Where should I keep my medicine? Keep out of the reach of children. This drug is given in a hospital or clinic and will not be stored at home. NOTE: This sheet is a summary. It may not cover all possible information. If you have questions about this medicine, talk to your doctor, pharmacist, or health care provider.  2019 Elsevier/Gold Standard (2017-03-18 94:37:00)

## 2018-05-29 ENCOUNTER — Encounter: Payer: Self-pay | Admitting: Hematology & Oncology

## 2018-05-29 ENCOUNTER — Inpatient Hospital Stay: Payer: BLUE CROSS/BLUE SHIELD

## 2018-05-29 ENCOUNTER — Other Ambulatory Visit: Payer: Self-pay

## 2018-05-29 ENCOUNTER — Inpatient Hospital Stay (HOSPITAL_BASED_OUTPATIENT_CLINIC_OR_DEPARTMENT_OTHER): Payer: BLUE CROSS/BLUE SHIELD | Admitting: Hematology & Oncology

## 2018-05-29 VITALS — BP 128/68 | HR 104 | Temp 97.6°F | Resp 18 | Wt 166.2 lb

## 2018-05-29 DIAGNOSIS — C9000 Multiple myeloma not having achieved remission: Secondary | ICD-10-CM

## 2018-05-29 DIAGNOSIS — Z5112 Encounter for antineoplastic immunotherapy: Secondary | ICD-10-CM | POA: Diagnosis not present

## 2018-05-29 DIAGNOSIS — R779 Abnormality of plasma protein, unspecified: Secondary | ICD-10-CM

## 2018-05-29 DIAGNOSIS — C9002 Multiple myeloma in relapse: Secondary | ICD-10-CM

## 2018-05-29 DIAGNOSIS — M899 Disorder of bone, unspecified: Secondary | ICD-10-CM

## 2018-05-29 LAB — CMP (CANCER CENTER ONLY)
ALK PHOS: 30 U/L — AB (ref 38–126)
ALT: 15 U/L (ref 0–44)
AST: 33 U/L (ref 15–41)
Albumin: 3.7 g/dL (ref 3.5–5.0)
Anion gap: 5 (ref 5–15)
BUN: 19 mg/dL (ref 8–23)
CO2: 25 mmol/L (ref 22–32)
Calcium: 10.7 mg/dL — ABNORMAL HIGH (ref 8.9–10.3)
Chloride: 104 mmol/L (ref 98–111)
Creatinine: 1.07 mg/dL — ABNORMAL HIGH (ref 0.44–1.00)
GFR, EST NON AFRICAN AMERICAN: 55 mL/min — AB (ref >60)
GFR, Est AFR Am: >60 mL/min (ref >60)
Glucose, Bld: 92 mg/dL (ref 70–99)
Potassium: 3.9 mmol/L (ref 3.5–5.1)
Sodium: 134 mmol/L — ABNORMAL LOW (ref 135–145)
Total Bilirubin: 0.5 mg/dL (ref 0.3–1.2)
Total Protein: 10 g/dL — ABNORMAL HIGH (ref 6.5–8.1)

## 2018-05-29 LAB — CBC WITH DIFFERENTIAL (CANCER CENTER ONLY)
Abs Immature Granulocytes: 0.02 10*3/uL (ref 0.00–0.07)
BASOS PCT: 1 %
Basophils Absolute: 0 10*3/uL (ref 0.0–0.1)
Eosinophils Absolute: 0.1 10*3/uL (ref 0.0–0.5)
Eosinophils Relative: 2 %
HCT: 29.8 % — ABNORMAL LOW (ref 36.0–46.0)
Hemoglobin: 9.7 g/dL — ABNORMAL LOW (ref 12.0–15.0)
Immature Granulocytes: 1 %
Lymphocytes Relative: 34 %
Lymphs Abs: 1.1 10*3/uL (ref 0.7–4.0)
MCH: 30 pg (ref 26.0–34.0)
MCHC: 32.6 g/dL (ref 30.0–36.0)
MCV: 92.3 fL (ref 80.0–100.0)
Monocytes Absolute: 0.6 10*3/uL (ref 0.1–1.0)
Monocytes Relative: 19 %
Neutro Abs: 1.4 10*3/uL — ABNORMAL LOW (ref 1.7–7.7)
Neutrophils Relative %: 43 %
Platelet Count: 105 10*3/uL — ABNORMAL LOW (ref 150–400)
RBC: 3.23 MIL/uL — ABNORMAL LOW (ref 3.87–5.11)
RDW: 14.4 % (ref 11.5–15.5)
WBC Count: 3.2 10*3/uL — ABNORMAL LOW (ref 4.0–10.5)
nRBC: 0 % (ref 0.0–0.2)

## 2018-05-29 MED ORDER — PROCHLORPERAZINE MALEATE 10 MG PO TABS
ORAL_TABLET | ORAL | Status: AC
  Filled 2018-05-29: qty 1

## 2018-05-29 MED ORDER — DIPHENHYDRAMINE HCL 25 MG PO CAPS
25.0000 mg | ORAL_CAPSULE | Freq: Once | ORAL | Status: AC
  Administered 2018-05-29: 25 mg via ORAL

## 2018-05-29 MED ORDER — SODIUM CHLORIDE 0.9 % IV SOLN
800.0000 mg | Freq: Once | INTRAVENOUS | Status: AC
  Administered 2018-05-29: 800 mg via INTRAVENOUS
  Filled 2018-05-29: qty 32

## 2018-05-29 MED ORDER — SODIUM CHLORIDE 0.9% FLUSH
10.0000 mL | INTRAVENOUS | Status: DC | PRN
  Administered 2018-05-29: 10 mL
  Filled 2018-05-29: qty 10

## 2018-05-29 MED ORDER — HEPARIN SOD (PORK) LOCK FLUSH 100 UNIT/ML IV SOLN
500.0000 [IU] | Freq: Once | INTRAVENOUS | Status: AC | PRN
  Administered 2018-05-29: 500 [IU]
  Filled 2018-05-29: qty 5

## 2018-05-29 MED ORDER — DARBEPOETIN ALFA 300 MCG/0.6ML IJ SOSY
300.0000 ug | PREFILLED_SYRINGE | Freq: Once | INTRAMUSCULAR | Status: AC
  Administered 2018-05-29: 300 ug via SUBCUTANEOUS

## 2018-05-29 MED ORDER — PROCHLORPERAZINE MALEATE 10 MG PO TABS
10.0000 mg | ORAL_TABLET | Freq: Once | ORAL | Status: AC
  Administered 2018-05-29: 10 mg via ORAL

## 2018-05-29 MED ORDER — DEXAMETHASONE SODIUM PHOSPHATE 10 MG/ML IJ SOLN
8.0000 mg | Freq: Once | INTRAMUSCULAR | Status: AC
  Administered 2018-05-29: 8 mg via INTRAVENOUS

## 2018-05-29 MED ORDER — DIPHENHYDRAMINE HCL 25 MG PO CAPS
ORAL_CAPSULE | ORAL | Status: AC
  Filled 2018-05-29: qty 1

## 2018-05-29 MED ORDER — ACETAMINOPHEN 325 MG PO TABS
650.0000 mg | ORAL_TABLET | Freq: Once | ORAL | Status: AC
  Administered 2018-05-29: 650 mg via ORAL

## 2018-05-29 MED ORDER — ACETAMINOPHEN 325 MG PO TABS
ORAL_TABLET | ORAL | Status: AC
  Filled 2018-05-29: qty 2

## 2018-05-29 MED ORDER — DEXAMETHASONE SODIUM PHOSPHATE 10 MG/ML IJ SOLN
INTRAMUSCULAR | Status: AC
  Filled 2018-05-29: qty 1

## 2018-05-29 MED ORDER — FAMOTIDINE IN NACL 20-0.9 MG/50ML-% IV SOLN
20.0000 mg | Freq: Once | INTRAVENOUS | Status: AC
  Administered 2018-05-29: 20 mg via INTRAVENOUS

## 2018-05-29 MED ORDER — DARBEPOETIN ALFA 300 MCG/0.6ML IJ SOSY
PREFILLED_SYRINGE | INTRAMUSCULAR | Status: AC
  Filled 2018-05-29: qty 0.6

## 2018-05-29 MED ORDER — SODIUM CHLORIDE 0.9 % IV SOLN
Freq: Once | INTRAVENOUS | Status: AC
  Administered 2018-05-29: 10:00:00 via INTRAVENOUS
  Filled 2018-05-29: qty 250

## 2018-05-29 MED ORDER — FAMOTIDINE IN NACL 20-0.9 MG/50ML-% IV SOLN
INTRAVENOUS | Status: AC
  Filled 2018-05-29: qty 50

## 2018-05-29 NOTE — Patient Instructions (Signed)
Dexamethasone injection What is this medicine? DEXAMETHASONE (dex a METH a sone) is a corticosteroid. It is used to treat inflammation of the skin, joints, lungs, and other organs. Common conditions treated include asthma, allergies, and arthritis. It is also used for other conditions, like blood disorders and diseases of the adrenal glands. This medicine may be used for other purposes; ask your health care provider or pharmacist if you have questions. COMMON BRAND NAME(S): Decadron, DoubleDex, Simplist Dexamethasone, Solurex What should I tell my health care provider before I take this medicine? They need to know if you have any of these conditions: -blood clotting problems -Cushing's syndrome -diabetes -glaucoma -heart problems or disease -high blood pressure -infection like herpes, measles, tuberculosis, or chickenpox -kidney disease -liver disease -mental problems -myasthenia gravis -osteoporosis -previous heart attack -seizures -stomach, ulcer or intestine disease including colitis and diverticulitis -thyroid problem -an unusual or allergic reaction to dexamethasone, corticosteroids, other medicines, lactose, foods, dyes, or preservatives -pregnant or trying to get pregnant -breast-feeding How should I use this medicine? This medicine is for injection into a muscle, joint, lesion, soft tissue, or vein. It is given by a health care professional in a hospital or clinic setting. Talk to your pediatrician regarding the use of this medicine in children. Special care may be needed. Overdosage: If you think you have taken too much of this medicine contact a poison control center or emergency room at once. NOTE: This medicine is only for you. Do not share this medicine with others. What if I miss a dose? This may not apply. If you are having a series of injections over a prolonged period, try not to miss an appointment. Call your doctor or health care professional to reschedule if you  are unable to keep an appointment. What may interact with this medicine? Do not take this medicine with any of the following medications: -mifepristone, RU-486 -vaccines This medicine may also interact with the following medications: -amphotericin B -antibiotics like clarithromycin, erythromycin, and troleandomycin -aspirin and aspirin-like drugs -barbiturates like phenobarbital -carbamazepine -cholestyramine -cholinesterase inhibitors like donepezil, galantamine, rivastigmine, and tacrine -cyclosporine -digoxin -diuretics -ephedrine -female hormones, like estrogens or progestins and birth control pills -indinavir -isoniazid -ketoconazole -medicines for diabetes -medicines that improve muscle tone or strength for conditions like myasthenia gravis -NSAIDs, medicines for pain and inflammation, like ibuprofen or naproxen -phenytoin -rifampin -thalidomide -warfarin This list may not describe all possible interactions. Give your health care provider a list of all the medicines, herbs, non-prescription drugs, or dietary supplements you use. Also tell them if you smoke, drink alcohol, or use illegal drugs. Some items may interact with your medicine. What should I watch for while using this medicine? Your condition will be monitored carefully while you are receiving this medicine. If you are taking this medicine for a long time, carry an identification card with your name and address, the type and dose of your medicine, and your doctor's name and address. This medicine may increase your risk of getting an infection. Stay away from people who are sick. Tell your doctor or health care professional if you are around anyone with measles or chickenpox. Talk to your health care provider before you get any vaccines that you take this medicine. If you are going to have surgery, tell your doctor or health care professional that you have taken this medicine within the last twelve months. Ask your  doctor or health care professional about your diet. You may need to lower the amount of salt you eat. The  medicine can increase your blood sugar. If you are a diabetic check with your doctor if you need help adjusting the dose of your diabetic medicine. What side effects may I notice from receiving this medicine? Side effects that you should report to your doctor or health care professional as soon as possible: -allergic reactions like skin rash, itching or hives, swelling of the face, lips, or tongue -black or tarry stools -change in the amount of urine -changes in vision -confusion, excitement, restlessness, a false sense of well-being -fever, sore throat, sneezing, cough, or other signs of infection, wounds that will not heal -hallucinations -increased thirst -mental depression, mood swings, mistaken feelings of self importance or of being mistreated -pain in hips, back, ribs, arms, shoulders, or legs -pain, redness, or irritation at the injection site -redness, blistering, peeling or loosening of the skin, including inside the mouth -rounding out of face -swelling of feet or lower legs -unusual bleeding or bruising -unusual tired or weak -wounds that do not heal Side effects that usually do not require medical attention (report to your doctor or health care professional if they continue or are bothersome): -diarrhea or constipation -change in taste -headache -nausea, vomiting -skin problems, acne, thin and shiny skin -touble sleeping -unusual growth of hair on the face or body -weight gain This list may not describe all possible side effects. Call your doctor for medical advice about side effects. You may report side effects to FDA at 1-800-FDA-1088. Where should I keep my medicine? This drug is given in a hospital or clinic and will not be stored at home. NOTE: This sheet is a summary. It may not cover all possible information. If you have questions about this medicine, talk to  your doctor, pharmacist, or health care provider.  2019 [employer] Standard (2007-08-17 14:04:12) Prochlorperazine tablets What is this medicine? PROCHLORPERAZINE (proe klor PER a zeen) helps to control severe nausea and vomiting. This medicine is also used to treat schizophrenia. It can also help patients who experience anxiety that is not due to psychological illness. This medicine may be used for other purposes; ask your health care provider or pharmacist if you have questions. COMMON BRAND NAME(S): Compazine What should I tell my health care provider before I take this medicine? They need to know if you have any of these conditions: -blood disorders or disease -dementia -liver disease or jaundice -Parkinson's disease -uncontrollable movement disorder -an unusual or allergic reaction to prochlorperazine, other medicines, foods, dyes, or preservatives -pregnant or trying to get pregnant -breast-feeding How should I use this medicine? Take this medicine by mouth with a glass of water. Follow the directions on the prescription label. Take your doses at regular intervals. Do not take your medicine more often than directed. Do not stop taking this medicine suddenly. This can cause nausea, vomiting, and dizziness. Ask your doctor or health care professional for advice. Talk to your pediatrician regarding the use of this medicine in children. Special care may be needed. While this drug may be prescribed for children as young as 2 years for selected conditions, precautions do apply. Overdosage: If you think you have taken too much of this medicine contact a poison control center or emergency room at once. NOTE: This medicine is only for you. Do not share this medicine with others. What if I miss a dose? If you miss a dose, take it as soon as you can. If it is almost time for your next dose, take only that dose. Do not take double  or extra doses. What may interact with this medicine? Do not  take this medicine with any of the following medications: -amoxapine -antidepressants like citalopram, escitalopram, fluoxetine, paroxetine, and sertraline -deferoxamine -dofetilide -maprotiline -tricyclic antidepressants like amitriptyline, clomipramine, imipramine, nortiptyline and others This medicine may also interact with the following medications: -lithium -medicines for pain -phenytoin -propranolol -warfarin This list may not describe all possible interactions. Give your health care provider a list of all the medicines, herbs, non-prescription drugs, or dietary supplements you use. Also tell them if you smoke, drink alcohol, or use illegal drugs. Some items may interact with your medicine. What should I watch for while using this medicine? Visit your doctor or health care professional for regular checks on your progress. You may get drowsy or dizzy. Do not drive, use machinery, or do anything that needs mental alertness until you know how this medicine affects you. Do not stand or sit up quickly, especially if you are an older patient. This reduces the risk of dizzy or fainting spells. Alcohol may interfere with the effect of this medicine. Avoid alcoholic drinks. This medicine can reduce the response of your body to heat or cold. Dress warm in cold weather and stay hydrated in hot weather. If possible, avoid extreme temperatures like saunas, hot tubs, very hot or cold showers, or activities that can cause dehydration such as vigorous exercise. This medicine can make you more sensitive to the sun. Keep out of the sun. If you cannot avoid being in the sun, wear protective clothing and use sunscreen. Do not use sun lamps or tanning beds/booths. Your mouth may get dry. Chewing sugarless gum or sucking hard candy, and drinking plenty of water may help. Contact your doctor if the problem does not go away or is severe. What side effects may I notice from receiving this medicine? Side effects  that you should report to your doctor or health care professional as soon as possible: -blurred vision -breast enlargement in men or women -breast milk in women who are not breast-feeding -chest pain, fast or irregular heartbeat -confusion, restlessness -dark yellow or brown urine -difficulty breathing or swallowing -dizziness or fainting spells -drooling, shaking, movement difficulty (shuffling walk) or rigidity -fever, chills, sore throat -involuntary or uncontrollable movements of the eyes, mouth, head, arms, and legs -seizures -stomach area pain -unusually weak or tired -unusual bleeding or bruising -yellowing of skin or eyes Side effects that usually do not require medical attention (report to your doctor or health care professional if they continue or are bothersome): -difficulty passing urine -difficulty sleeping -headache -sexual dysfunction -skin rash, or itching This list may not describe all possible side effects. Call your doctor for medical advice about side effects. You may report side effects to FDA at 1-800-FDA-1088. Where should I keep my medicine? Keep out of the reach of children. Store at room temperature between 15 and 30 degrees C (59 and 86 degrees F). Protect from light. Throw away any unused medicine after the expiration date. NOTE: This sheet is a summary. It may not cover all possible information. If you have questions about this medicine, talk to your doctor, pharmacist, or health care provider.  2019 [employer] Standard (2011-09-14 16:59:39) Acetaminophen tablets or caplets What is this medicine? ACETAMINOPHEN (a set a MEE noe fen) is a pain reliever. It is used to treat mild pain and fever. This medicine may be used for other purposes; ask your health care provider or pharmacist if you have questions. COMMON BRAND NAME(S): Aceta, Actamin, Anacin  Aspirin Free, Genapap, Genebs, Mapap, Pain & Fever, Pain and Fever, PAIN RELIEF, PAIN RELIEF Extra  Strength, Pain Reliever, Panadol, PHARBETOL, Q-Pap, Q-Pap Extra Strength, Tylenol, Tylenol CrushableTablet, Tylenol Extra Strength, XS No Aspirin, XS Pain Reliever What should I tell my health care provider before I take this medicine? They need to know if you have any of these conditions: -if you often drink alcohol -liver disease -an unusual or allergic reaction to acetaminophen, other medicines, foods, dyes, or preservatives -pregnant or trying to get pregnant -breast-feeding How should I use this medicine? Take this medicine by mouth with a glass of water. Follow the directions on the package or prescription label. Take your medicine at regular intervals. Do not take your medicine more often than directed. Talk to your pediatrician regarding the use of this medicine in children. While this drug may be prescribed for children as young as 42 years of age for selected conditions, precautions do apply. Overdosage: If you think you have taken too much of this medicine contact a poison control center or emergency room at once. NOTE: This medicine is only for you. Do not share this medicine with others. What if I miss a dose? If you miss a dose, take it as soon as you can. If it is almost time for your next dose, take only that dose. Do not take double or extra doses. What may interact with this medicine? -alcohol -imatinib -isoniazid -other medicines with acetaminophen This list may not describe all possible interactions. Give your health care provider a list of all the medicines, herbs, non-prescription drugs, or dietary supplements you use. Also tell them if you smoke, drink alcohol, or use illegal drugs. Some items may interact with your medicine. What should I watch for while using this medicine? Tell your doctor or health care professional if the pain lasts more than 10 days (5 days for children), if it gets worse, or if there is a new or different kind of pain. Also, check with your doctor  if a fever lasts for more than 3 days. Do not take other medicines that contain acetaminophen with this medicine. Always read labels carefully. If you have questions, ask your doctor or pharmacist. If you take too much acetaminophen get medical help right away. Too much acetaminophen can be very dangerous and cause liver damage. Even if you do not have symptoms, it is important to get help right away. What side effects may I notice from receiving this medicine? Side effects that you should report to your doctor or health care professional as soon as possible: -allergic reactions like skin rash, itching or hives, swelling of the face, lips, or tongue -breathing problems -fever or sore throat -redness, blistering, peeling or loosening of the skin, including inside the mouth -trouble passing urine or change in the amount of urine -unusual bleeding or bruising -unusually weak or tired -yellowing of the eyes or skin Side effects that usually do not require medical attention (report to your doctor or health care professional if they continue or are bothersome): -headache -nausea, stomach upset This list may not describe all possible side effects. Call your doctor for medical advice about side effects. You may report side effects to FDA at 1-800-FDA-1088. Where should I keep my medicine? Keep out of reach of children. Store at room temperature between 20 and 25 degrees C (68 and 77 degrees F). Protect from moisture and heat. Throw away any unused medicine after the expiration date. NOTE: This sheet is a summary. It  may not cover all possible information. If you have questions about this medicine, talk to your doctor, pharmacist, or health care provider.  2019 [employer] Standard (2012-12-18 12:54:16) Diphenhydramine capsules or tablets What is this medicine? DIPHENHYDRAMINE (dye fen HYE dra meen) is an antihistamine. It is used to treat the symptoms of an allergic reaction. It is also used to  treat Parkinson's disease. This medicine is also used to prevent and to treat motion sickness and as a nighttime sleep aid. This medicine may be used for other purposes; ask your health care provider or pharmacist if you have questions. COMMON BRAND NAME(S): Alka-Seltzer Plus Allergy, Aller-G-Time, Banophen, Benadryl Allergy, Benadryl Allergy Dye Free, Benadryl Allergy Kapgel, Benadryl Allergy Ultratab, Diphedryl, Diphenhist, Genahist, Geri-Dryl, PHARBEDRYL, Q-Dryl, Gretta Began, Valu-Dryl, Vicks ZzzQuil Nightime Sleep-Aid What should I tell my health care provider before I take this medicine? They need to know if you have any of these conditions: -asthma or lung disease -glaucoma -high blood pressure or heart disease -liver disease -pain or difficulty passing urine -prostate trouble -ulcers or other stomach problems -an unusual or allergic reaction to diphenhydramine, other medicines foods, dyes, or preservatives such as sulfites -pregnant or trying to get pregnant -breast-feeding How should I use this medicine? Take this medicine by mouth with a full glass of water. Follow the directions on the prescription label. Take your doses at regular intervals. Do not take your medicine more often than directed. To prevent motion sickness start taking this medicine 30 to 60 minutes before you leave. Talk to your pediatrician regarding the use of this medicine in children. Special care may be needed. Patients over 37 years old may have a stronger reaction and need a smaller dose. Overdosage: If you think you have taken too much of this medicine contact a poison control center or emergency room at once. NOTE: This medicine is only for you. Do not share this medicine with others. What if I miss a dose? If you miss a dose, take it as soon as you can. If it is almost time for your next dose, take only that dose. Do not take double or extra doses. What may interact with this medicine? Do not take this  medicine with any of the following medications: -MAOIs like Carbex, Eldepryl, Marplan, Nardil, and Parnate This medicine may also interact with the following medications: -alcohol -barbiturates, like phenobarbital -medicines for bladder spasm like oxybutynin, tolterodine -medicines for blood pressure -medicines for depression, anxiety, or psychotic disturbances -medicines for movement abnormalities or Parkinson's disease -medicines for sleep -other medicines for cold, cough or allergy -some medicines for the stomach like chlordiazepoxide, dicyclomine This list may not describe all possible interactions. Give your health care provider a list of all the medicines, herbs, non-prescription drugs, or dietary supplements you use. Also tell them if you smoke, drink alcohol, or use illegal drugs. Some items may interact with your medicine. What should I watch for while using this medicine? Visit your doctor or health care professional for regular check ups. Tell your doctor if your symptoms do not improve or if they get worse. Your mouth may get dry. Chewing sugarless gum or sucking hard candy, and drinking plenty of water may help. Contact your doctor if the problem does not go away or is severe. This medicine may cause dry eyes and blurred vision. If you wear contact lenses you may feel some discomfort. Lubricating drops may help. See your eye doctor if the problem does not go away or is severe. You may get  drowsy or dizzy. Do not drive, use machinery, or do anything that needs mental alertness until you know how this medicine affects you. Do not stand or sit up quickly, especially if you are an older patient. This reduces the risk of dizzy or fainting spells. Alcohol may interfere with the effect of this medicine. Avoid alcoholic drinks. What side effects may I notice from receiving this medicine? Side effects that you should report to your doctor or health care professional as soon as  possible: -allergic reactions like skin rash, itching or hives, swelling of the face, lips, or tongue -changes in vision -confused, agitated, nervous -irregular or fast heartbeat -tremor -trouble passing urine -unusual bleeding or bruising -unusually weak or tired Side effects that usually do not require medical attention (report to your doctor or health care professional if they continue or are bothersome): -constipation, diarrhea -drowsy -headache -loss of appetite -stomach upset, vomiting -thick mucous This list may not describe all possible side effects. Call your doctor for medical advice about side effects. You may report side effects to FDA at 1-800-FDA-1088. Where should I keep my medicine? Keep out of the reach of children. Store at room temperature between 15 and 30 degrees C (59 and 86 degrees F). Keep container closed tightly. Throw away any unused medicine after the expiration date. NOTE: This sheet is a summary. It may not cover all possible information. If you have questions about this medicine, talk to your doctor, pharmacist, or health care provider.  2019 [employer] Standard (2007-08-14 17:06:22) Famotidine injection What is this medicine? FAMOTIDINE (fa MOE ti deen) is a type of antihistamine that blocks the release of stomach acid. It is used to treat stomach or intestinal ulcers. It can relieve ulcer pain and discomfort, and the heartburn from acid reflux. This medicine may be used for other purposes; ask your health care provider or pharmacist if you have questions. COMMON BRAND NAME(S): Pepcid What should I tell my health care provider before I take this medicine? They need to know if you have any of these conditions: -kidney or liver disease -an unusual or allergic reaction to famotidine, other medicines, foods, dyes, or preservatives -pregnant or trying to get pregnant -breast-feeding How should I use this medicine? This medicine is for infusion into a  vein. It is given by a health care professional in a hospital or clinic setting. Talk to your pediatrician regarding the use of this medicine in children. Special care may be needed. Overdosage: If you think you have taken too much of this medicine contact a poison control center or emergency room at once. NOTE: This medicine is only for you. Do not share this medicine with others. What if I miss a dose? This does not apply. What may interact with this medicine? -delavirdine -itraconazole -ketoconazole This list may not describe all possible interactions. Give your health care provider a list of all the medicines, herbs, non-prescription drugs, or dietary supplements you use. Also tell them if you smoke, drink alcohol, or use illegal drugs. Some items may interact with your medicine. What should I watch for while using this medicine? Tell your doctor or health care professional if your condition does not start to get better or gets worse. Do not take with aspirin, ibuprofen, or other antiinflammatory medicines. These can aggravate your condition. Do not smoke cigarettes or drink alcohol. These increase irritation in your stomach and can increase the time it will take for ulcers to heal. Cigarettes and alcohol can also worsen acid reflux  or heartburn. If you get black, tarry stools or vomit up what looks like coffee grounds, call your doctor or health care professional at once. You may have a bleeding ulcer. This medicine may cause a decrease in vitamin B12. You should make sure that you get enough vitamin B12 while you are taking this medicine. Discuss the foods you eat and the vitamins you take with your health care professional. What side effects may I notice from receiving this medicine? Side effects that you should report to your doctor or health care professional as soon as possible: -allergic reactions like skin rash, itching or hives, swelling of the face, lips, or tongue -agitation,  nervousness -confusion -hallucinations Side effects that usually do not require medical attention (report to your doctor or health care professional if they continue or are bothersome): -constipation -diarrhea -dizziness -headache This list may not describe all possible side effects. Call your doctor for medical advice about side effects. You may report side effects to FDA at 1-800-FDA-1088. Where should I keep my medicine? This medicine is given in a hospital or clinic. You will not be given this medicine to store at home. NOTE: This sheet is a summary. It may not cover all possible information. If you have questions about this medicine, talk to your doctor, pharmacist, or health care provider.  2019 [employer] Standard (2016-12-10 13:16:46) Darbepoetin Alfa injection What is this medicine? DARBEPOETIN ALFA (dar be POE e tin AL fa) helps your body make more red blood cells. It is used to treat anemia caused by chronic kidney failure and chemotherapy. This medicine may be used for other purposes; ask your health care provider or pharmacist if you have questions. COMMON BRAND NAME(S): Aranesp What should I tell my health care provider before I take this medicine? They need to know if you have any of these conditions: -blood clotting disorders or history of blood clots -cancer patient not on chemotherapy -cystic fibrosis -heart disease, such as angina, heart failure, or a history of a heart attack -hemoglobin level of 12 g/dL or greater -high blood pressure -low levels of folate, iron, or vitamin B12 -seizures -an unusual or allergic reaction to darbepoetin, erythropoietin, albumin, hamster proteins, latex, other medicines, foods, dyes, or preservatives -pregnant or trying to get pregnant -breast-feeding How should I use this medicine? This medicine is for injection into a vein or under the skin. It is usually given by a health care professional in a hospital or clinic setting. If  you get this medicine at home, you will be taught how to prepare and give this medicine. Use exactly as directed. Take your medicine at regular intervals. Do not take your medicine more often than directed. It is important that you put your used needles and syringes in a special sharps container. Do not put them in a trash can. If you do not have a sharps container, call your pharmacist or healthcare provider to get one. A special MedGuide will be given to you by the pharmacist with each prescription and refill. Be sure to read this information carefully each time. Talk to your pediatrician regarding the use of this medicine in children. While this medicine may be used in children as young as 61 month of age for selected conditions, precautions do apply. Overdosage: If you think you have taken too much of this medicine contact a poison control center or emergency room at once. NOTE: This medicine is only for you. Do not share this medicine with others. What if I miss  a dose? If you miss a dose, take it as soon as you can. If it is almost time for your next dose, take only that dose. Do not take double or extra doses. What may interact with this medicine? Do not take this medicine with any of the following medications: -epoetin alfa This list may not describe all possible interactions. Give your health care provider a list of all the medicines, herbs, non-prescription drugs, or dietary supplements you use. Also tell them if you smoke, drink alcohol, or use illegal drugs. Some items may interact with your medicine. What should I watch for while using this medicine? Your condition will be monitored carefully while you are receiving this medicine. You may need blood work done while you are taking this medicine. This medicine may cause a decrease in vitamin B6. You should make sure that you get enough vitamin B6 while you are taking this medicine. Discuss the foods you eat and the vitamins you take with  your health care professional. What side effects may I notice from receiving this medicine? Side effects that you should report to your doctor or health care professional as soon as possible: -allergic reactions like skin rash, itching or hives, swelling of the face, lips, or tongue -breathing problems -changes in vision -chest pain -confusion, trouble speaking or understanding -feeling faint or lightheaded, falls -high blood pressure -muscle aches or pains -pain, swelling, warmth in the leg -rapid weight gain -severe headaches -sudden numbness or weakness of the face, arm or leg -trouble walking, dizziness, loss of balance or coordination -seizures (convulsions) -swelling of the ankles, feet, hands -unusually weak or tired Side effects that usually do not require medical attention (report to your doctor or health care professional if they continue or are bothersome): -diarrhea -fever, chills (flu-like symptoms) -headaches -nausea, vomiting -redness, stinging, or swelling at site where injected This list may not describe all possible side effects. Call your doctor for medical advice about side effects. You may report side effects to FDA at 1-800-FDA-1088. Where should I keep my medicine? Keep out of the reach of children. Store in a refrigerator between 2 and 8 degrees C (36 and 46 degrees F). Do not freeze. Do not shake. Throw away any unused portion if using a single-dose vial. Throw away any unused medicine after the expiration date. NOTE: This sheet is a summary. It may not cover all possible information. If you have questions about this medicine, talk to your doctor, pharmacist, or health care provider.  2019 Elsevier/Gold Standard (2017-05-11 16:44:20) Elotuzumab injection What is this medicine? ELOTUZUMAB (el oh tooz ue mab) is a monoclonal antibody. It is used to treat multiple myeloma. This medicine may be used for other purposes; ask your health care provider or  pharmacist if you have questions. COMMON BRAND NAME(S): Empliciti What should I tell my health care provider before I take this medicine? They need to know if you have any of these conditions: -hepatic disease -infection -an unusual or allergic reaction to elotuzumab, other medicines, foods, dyes, or preservatives -pregnant or trying to get pregnant -breast-feeding How should I use this medicine? This medicine is for infusion into a vein. It is given by a health care professional in a hospital or clinic setting. Talk to your pediatrician regarding the use of this medicine in children. Special care may be needed. Overdosage: If you think you have taken too much of this medicine contact a poison control center or emergency room at once. NOTE: This medicine is only  for you. Do not share this medicine with others. What if I miss a dose? Keep appointments for follow-up doses as directed. It is important not to miss your dose. Call your doctor or health care professional if you are unable to keep an appointment. What may interact with this medicine? Interactions have not been studied. Give your health care provider a list of all the medicines, herbs, non-prescription drugs, or dietary supplements you use. Also tell them if you smoke, drink alcohol, or use illegal drugs. Some items may interact with your medicine. This list may not describe all possible interactions. Give your health care provider a list of all the medicines, herbs, non-prescription drugs, or dietary supplements you use. Also tell them if you smoke, drink alcohol, or use illegal drugs. Some items may interact with your medicine. What should I watch for while using this medicine? This drug may make you feel generally unwell. Report any side effects. Continue your course of treatment even though you feel ill unless your doctor tells you to stop. This medicine can cause serious allergic reactions. To reduce your risk you may need to take  medicine before treatment with this medicine. Take your medicine as directed. You may need blood work done while you are taking this medicine. This medicine can affect the results of some tests used to determine treatment response; extra tests may be needed to evaluate response. Talk to your doctor about your risk of cancer. You may be more at risk for certain types of cancers if you take this medicine. Women should inform their doctor if they wish to become pregnant or think they might be pregnant. There is a potential for serious side effects to an unborn child. Talk to your health care professional or pharmacist for more information. Do not breast-feed an infant while taking this medicine. What side effects may I notice from receiving this medicine? Side effects that you should report to your doctor or health care professional as soon as possible: -allergic reactions like skin rash, itching or hives, swelling of the face, lips, or tongue -breathing problems -chest pain -dizziness -lightheaded -signs and symptoms of high blood sugar such as dry mouth, dry skin, fruity breath, stomach pain, increased hunger, increased thirst, increased urination -signs and symptoms of infection like fever or chills; cough; sore throat; pain or trouble passing urine -signs and symptoms of liver injury like dark yellow or brown urine; general ill feeling or flu-like symptoms; light-colored stools; loss of appetite; nausea; right upper belly pain; unusually weak or tired; yellowing of the eyes or skin Side effects that usually do not require medical attention (report to your doctor or health care professional if they continue or are bothersome): -constipation -decreased appetite -diarrhea -pain, tingling, numbness in the hands or feet -tiredness This list may not describe all possible side effects. Call your doctor for medical advice about side effects. You may report side effects to FDA at 1-800-FDA-1088. Where  should I keep my medicine? Keep out of the reach of children. This drug is given in a hospital or clinic and will not be stored at home. NOTE: This sheet is a summary. It may not cover all possible information. If you have questions about this medicine, talk to your doctor, pharmacist, or health care provider.  2019 Elsevier/Gold Standard (2017-03-18 63:89:37)

## 2018-05-29 NOTE — Progress Notes (Signed)
Okay to treat with ANC 1.4 per Dr. Ennever. 

## 2018-05-29 NOTE — Progress Notes (Signed)
Hematology and Oncology Follow Up Visit  Barbara Cook [id number] [birth year] 63 y.o. 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 is here today with her husband for follow-up.  Unfortunately, her husband just got out of the hospital.  He was admitted because of gallbladder issues.  He has a drainage tube into the gallbladder.  He will not have his gallbladder out until the infection is better.  As far as Ms. currently is concerned, she is holding her own.  Unfortunately, I have a feeling that the elotuzimab may not be doing the job that I would like it to do.  When we last saw her, her M spike was 1.6 g/dL.  Her IgG level was 3182 mg/dL.  Her kappa light chain was 5.7 mg/dL.  I have a feeling that we are going to have to do a bone marrow test on her so that we can see exactly what is going on with the cytogenetics and why she is not responding if her levels keep going up.  If her levels keep going up, I am going to have to try to get her to 1 of the academic [facility] centers and see if they can consider her for a CAR-T therapy.  She has irritable bowel issue.  We have not been able to keep her on a good schedule as she gets these flareups of IBS and she cannot make it in for therapy.  Currently, her appetite is doing okay.  She has had no issues with diarrhea.  There is been some nausea but no  vomiting.  Currently, her performance status is ECOG 1.       Medications:  Allergies as of 05/29/2018      Reactions   Barium-containing Compounds Diarrhea   Oral Redicat gives pt severe diarrhea, causing delay in scan time, see previous ct scan notes for details   Codeine Palpitations      Medication List       Accurate as of May 29, 2018  3:29 PM. Always use your most recent med list.        acetaminophen 500 MG tablet Commonly known as:  TYLENOL Take 500 mg by mouth every 6 (six) hours as needed for mild pain or headache.   acyclovir 400 MG tablet Commonly known as:  ZOVIRAX Take 1 tablet (400 mg total) by mouth daily.   B-6 250 MG Tabs TAKE 1 TABLET (250 MG TOTAL) BY MOUTH DAILY.   dexamethasone 4 MG tablet Commonly known as:  DECADRON Take 5 tablets (20mg ) between 3 and 24 hours prior to chemotherapy. On non-chemotherapy days 8 and 22 , take 5 tablets (20 mg). Repeat every 28 days.   HYDROcodone-acetaminophen 5-325 MG tablet Commonly known as:  NORCO/VICODIN Take 1 tablet by mouth every 4 (four) hours as needed.   hyoscyamine 0.125 MG SL tablet Commonly known as:  LEVSIN SL PLACE 1 TABLET (0.125 MG TOTAL) UNDER  THE TONGUE EVERY 4 (FOUR) HOURS AS NEEDED.   lidocaine-prilocaine cream Commonly known as:  EMLA Apply to affected area once   LORazepam 0.5 MG tablet Commonly known as:  ATIVAN Take 1 tablet (0.5 mg total) by mouth every 6 (six) hours as needed (Nausea or vomiting).   magnesium oxide 400 (241.3 Mg) MG tablet Commonly known as:  MAG-OX TAKE 1 TABLET BY MOUTH TWICE A DAY   metroNIDAZOLE 500 MG tablet Commonly known as:  FLAGYL Take 1 tablet (500 mg total) by mouth 3 (three) times daily.   montelukast 10 MG tablet Commonly known as:  SINGULAIR Take 1 tablet (10 mg total) by mouth at bedtime.   multivitamin tablet Take 1 tablet by mouth every evening.   ondansetron 8 MG tablet Commonly known as:  ZOFRAN TAKE 1 TABLET BY MOUTH TWICE A  DAY FOR NAUSEA AND VOMITING 1 TABLET 1 HOUR PRIOR TO CHEMOTHERAPY   ondansetron 8 MG tablet Commonly known as:  ZOFRAN Take 1 tablet (8 mg total) by mouth 2 (two) times daily as needed (Nausea or vomiting).   pantoprazole 40 MG tablet Commonly known as:  PROTONIX Take 1 tablet (40 mg total) by mouth daily.   polyethylene glycol powder powder Commonly known as:  GLYCOLAX/MIRALAX 1 capful daily as needed   prochlorperazine 10 MG tablet Commonly known as:  COMPAZINE Take 1 tablet (10 mg total) by mouth every 6 (six) hours as needed for nausea or vomiting.   prochlorperazine 10 MG tablet Commonly known as:  COMPAZINE Take 1 tablet (10 mg total) by mouth every 6 (six) hours as needed (Nausea or vomiting).   torsemide 20 MG tablet Commonly known as:  DEMADEX Take 1 tablet (20 mg total) by mouth daily as needed.   Vitamin D (Ergocalciferol) 1.25 MG (50000 UT) Caps capsule Commonly known as:  DRISDOL TAKE 1 CAPSULE (50,000 UNITS TOTAL) BY MOUTH ONCE A WEEK.       Allergies:  Allergies  Allergen Reactions  . Barium-Containing Compounds Diarrhea    Oral Redicat gives pt severe diarrhea, causing delay in scan time, see previous ct scan notes for details  . Codeine Palpitations    Past Medical History, Surgical history, Social history, and Family History were reviewed and updated.  Review of Systems: Review of Systems  Constitutional: Negative.   HENT: Negative.   Eyes: Negative.   Respiratory: Negative.   Cardiovascular: Negative.   Gastrointestinal: Negative.   Genitourinary: Negative.   Musculoskeletal: Positive for myalgias.  Skin: Negative.   Neurological: Negative.   Endo/Heme/Allergies: Negative.   Psychiatric/Behavioral: Negative.    Marland Kitchen   Physical Exam:  weight is 166 lb 4 oz (75.4 kg). Her oral temperature is 97.6 F (36.4 C). Her blood pressure is 128/68 and her pulse is 104 (abnormal). Her respiration is 18 and oxygen saturation is 100%.   Wt Readings  from Last 3 Encounters:  05/29/18 166 lb 4 oz (75.4 kg)  05/08/18 168 lb (76.2 kg)  03/22/18 168 lb (76.2 kg)    Physical Exam Vitals signs reviewed.  HENT:     Head: Normocephalic and atraumatic.  Eyes:     Pupils: Pupils are equal, round, and reactive to light.  Neck:     Musculoskeletal: Normal range of motion.  Cardiovascular:     Rate and Rhythm: Normal rate and regular rhythm.     Heart sounds: Normal heart sounds.  Pulmonary:     Effort: Pulmonary effort is normal.     Breath sounds:  Normal breath sounds.  Abdominal:     General: Bowel sounds are normal.     Palpations: Abdomen is soft.  Musculoskeletal: Normal range of motion.        General: No tenderness or deformity.  Lymphadenopathy:     Cervical: No cervical adenopathy.  Skin:    General: Skin is warm and dry.     Findings: No erythema or rash.  Neurological:     Mental Status: She is alert and oriented to person, place, and time.  Psychiatric:        Behavior: Behavior normal.        Thought Content: Thought content normal.        Judgment: Judgment normal.      Lab Results  Component Value Date   WBC 3.2 (L) 05/29/2018   HGB 9.7 (L) 05/29/2018   HCT 29.8 (L) 05/29/2018   MCV 92.3 05/29/2018   PLT 105 (L) 05/29/2018   Lab Results  Component Value Date   FERRITIN 550 (H) 05/08/2018   IRON 70 05/08/2018   TIBC 238 05/08/2018   UIBC 168 05/08/2018   IRONPCTSAT 29 05/08/2018   Lab Results  Component Value Date   RETICCTPCT 1.2 05/08/2018   RBC 3.23 (L) 05/29/2018   RETICCTABS 35.9 12/25/2014   Lab Results  Component Value Date   KPAFRELGTCHN 56.8 (H) 05/08/2018   LAMBDASER 5.7 05/08/2018   KAPLAMBRATIO 9.96 (H) 05/08/2018   Lab Results  Component Value Date   IGGSERUM 3,182 (H) 05/08/2018   IGA 33 (L) 05/08/2018   IGMSERUM 6 (L) 05/08/2018   Lab Results  Component Value Date   TOTALPROTELP 7.4 04/17/2018   ALBUMINELP 3.7 04/17/2018   A1GS 0.2 04/17/2018   A2GS 0.6 04/17/2018    BETS 0.9 04/17/2018   BETA2SER 0.3 05/07/2015   GAMS 2.0 (H) 04/17/2018   MSPIKE 1.6 (H) 04/17/2018   SPEI Comment 02/06/2018     Chemistry      Component Value Date/Time   NA 134 (L) 05/29/2018 0900   NA 143 05/09/2017 0812   NA 139 02/20/2016 1207   K 3.9 05/29/2018 0900   K 4.0 05/09/2017 0812   K 3.8 02/20/2016 1207   CL 104 05/29/2018 0900   CL 108 05/09/2017 0812   CO2 25 05/29/2018 0900   CO2 26 05/09/2017 0812   CO2 29 02/20/2016 1207   BUN 19 05/29/2018 0900   BUN 13 05/09/2017 0812   BUN 22.3 02/20/2016 1207   CREATININE 1.07 (H) 05/29/2018 0900   CREATININE 0.9 05/09/2017 0812   CREATININE 1.3 (H) 02/20/2016 1207      Component Value Date/Time   CALCIUM 10.7 (H) 05/29/2018 0900   CALCIUM 8.3 05/09/2017 0812   CALCIUM 9.7 02/20/2016 1207   ALKPHOS 30 (L) 05/29/2018 0900   ALKPHOS 123 (H) 05/09/2017 0812   ALKPHOS 62 02/20/2016 1207   AST 33 05/29/2018 0900   AST 27 02/20/2016 1207   ALT 15 05/29/2018 0900   ALT 26 05/09/2017 0812   ALT 19 02/20/2016 1207   BILITOT 0.5 05/29/2018 0900   BILITOT 0.44 02/20/2016 1207      Impression and Plan: Ms. Market is a very pleasant 63 yo African American female with IgG kappa myeloma.   I think that the myeloma studies will be critical at this time.  Her total protein keeps going up.  I think this is a highly indicative indicator for her myeloma.  I know that she is not going to be  happy to have to talk about additional therapies.  I know that she did not want to talk about transplant back when she was first diagnosed with this.  I really think that we might be looking at CAR-T therapy as a viable treatment option for her.  I will plan to get her back in another 3 weeks or so and we will see how everything is going.        [name], MD 1/20/20203:29 PM

## 2018-06-01 ENCOUNTER — Other Ambulatory Visit: Payer: Self-pay

## 2018-06-01 ENCOUNTER — Encounter: Payer: Self-pay | Admitting: Radiation Oncology

## 2018-06-01 ENCOUNTER — Ambulatory Visit
Admission: RE | Admit: 2018-06-01 | Discharge: 2018-06-01 | Disposition: A | Discharge status: Home / Self Care | Payer: BLUE CROSS/BLUE SHIELD | Source: Ambulatory Visit | Attending: Radiation Oncology | Admitting: Radiation Oncology

## 2018-06-01 VITALS — BP 139/64 | HR 94 | Temp 98.2°F | Resp 18 | Ht 63.0 in | Wt 167.1 lb

## 2018-06-01 DIAGNOSIS — Z923 Personal history of irradiation: Secondary | ICD-10-CM | POA: Diagnosis not present

## 2018-06-01 DIAGNOSIS — Z79899 Other long term (current) drug therapy: Secondary | ICD-10-CM | POA: Insufficient documentation

## 2018-06-01 DIAGNOSIS — I700 Atherosclerosis of aorta: Secondary | ICD-10-CM | POA: Insufficient documentation

## 2018-06-01 DIAGNOSIS — J849 Interstitial pulmonary disease, unspecified: Secondary | ICD-10-CM | POA: Insufficient documentation

## 2018-06-01 DIAGNOSIS — Z9221 Personal history of antineoplastic chemotherapy: Secondary | ICD-10-CM | POA: Insufficient documentation

## 2018-06-01 DIAGNOSIS — R5383 Other fatigue: Secondary | ICD-10-CM | POA: Insufficient documentation

## 2018-06-01 DIAGNOSIS — C9002 Multiple myeloma in relapse: Secondary | ICD-10-CM | POA: Insufficient documentation

## 2018-06-01 DIAGNOSIS — C9000 Multiple myeloma not having achieved remission: Secondary | ICD-10-CM

## 2018-06-01 NOTE — Progress Notes (Signed)
Radiation Oncology         [phone] ________________________________  Name: Barbara Cook MRN: [id number]  Date: 06/01/2018  DOB: [birth year]  Follow-Up Visit Note  CC: [name], MD  [name], MD    ICD-10-CM   1. Multiple myeloma not having achieved remission [facility]) C90.00     Diagnosis:   63 y.o. female with Multiple myeloma not having achieved remission   Interval Since Last Radiation:  6 weeks  Radiation treatment dates:   03/21/18 - 04/14/18 Site/dose:   Chest / 2 Gy x 15 fractions for a total of 30 Gy  Narrative:  The patient returns today with her husband for routine follow-up.  Patient is currently receiving chemotherapy weekly x8 weeks under the care and direction of Dr. Marin Olp. She denies nausea, vomiting, or diarrhea. She reports fatigue is still present due to chemotherapy but is improving. She denies any chest pain. She denies difficulty with breathing.                       ALLERGIES:  is allergic to barium-containing compounds and codeine.  Meds: Current Outpatient Medications  Medication Sig Dispense Refill  . acetaminophen (TYLENOL) 500 MG tablet Take 500 mg by mouth every 6 (six) hours as needed for mild pain or headache.    . dexamethasone (DECADRON) 4 MG tablet Take 5 tablets (31m) between 3 and 24 hours prior to chemotherapy. On non-chemotherapy days 8 and 22 , take 5 tablets (20 mg). Repeat every 28 days. 40 tablet 3  . HYDROcodone-acetaminophen (NORCO/VICODIN) 5-325 MG tablet Take 1 tablet by mouth every 4 (four) hours as needed. 10 tablet 0  . hyoscyamine (LEVSIN SL) 0.125 MG SL tablet PLACE 1 TABLET (0.125 MG TOTAL) UNDER THE TONGUE EVERY 4 (FOUR) HOURS AS NEEDED. (Patient taking differently: Place 0.125 mg under the tongue every 4 (four) hours as needed (IBS). ) 60 tablet 1  . lidocaine-prilocaine (EMLA) cream Apply to affected area once 30 g 3  . LORazepam (ATIVAN) 0.5 MG tablet Take 1 tablet (0.5 mg total) by mouth every 6 (six) hours as  needed (Nausea or vomiting). 30 tablet 0  . magnesium oxide (MAG-OX) 400 (241.3 Mg) MG tablet TAKE 1 TABLET BY MOUTH TWICE A DAY (Patient taking differently: Take 400 mg by mouth daily. ) 60 tablet 1  . montelukast (SINGULAIR) 10 MG tablet Take 1 tablet (10 mg total) by mouth at bedtime. 30 tablet 5  . Multiple Vitamin (MULTIVITAMIN) tablet Take 1 tablet by mouth every evening.     . ondansetron (ZOFRAN) 8 MG tablet TAKE 1 TABLET BY MOUTH TWICE A DAY FOR NAUSEA AND VOMITING 1 TABLET 1 HOUR PRIOR TO CHEMOTHERAPY (Patient taking differently: Take 8 mg by mouth 2 (two) times daily as needed for nausea or vomiting (chemotherapy). ) 20 tablet 0  . ondansetron (ZOFRAN) 8 MG tablet Take 1 tablet (8 mg total) by mouth 2 (two) times daily as needed (Nausea or vomiting). 30 tablet 1  . pantoprazole (PROTONIX) 40 MG tablet Take 1 tablet (40 mg total) by mouth daily. 30 tablet 5  . prochlorperazine (COMPAZINE) 10 MG tablet Take 1 tablet (10 mg total) by mouth every 6 (six) hours as needed for nausea or vomiting. 90 tablet 6  . prochlorperazine (COMPAZINE) 10 MG tablet Take 1 tablet (10 mg total) by mouth every 6 (six) hours as needed (Nausea or vomiting). 30 tablet 1  . Pyridoxine HCl (B-6) 250 MG TABS TAKE  1 TABLET (250 MG TOTAL) BY MOUTH DAILY. (Patient taking differently: Take 250 mg by mouth daily. ) 30 tablet 6  . torsemide (DEMADEX) 20 MG tablet Take 1 tablet (20 mg total) by mouth daily as needed. (Patient taking differently: Take 20 mg by mouth daily as needed (fluid in ankles/swelling). ) 30 tablet 6  . Vitamin D, Ergocalciferol, (DRISDOL) 50000 units CAPS capsule TAKE 1 CAPSULE (50,000 UNITS TOTAL) BY MOUTH ONCE A WEEK. 12 capsule 3  . acyclovir (ZOVIRAX) 400 MG tablet Take 1 tablet (400 mg total) by mouth daily. (Patient not taking: Reported on 03/22/2018) 90 tablet 1  . polyethylene glycol powder (GLYCOLAX/MIRALAX) powder 1 capful daily as needed (Patient not taking: Reported on 03/22/2018) 255 g 3    No current facility-administered medications for this encounter.    Facility-Administered Medications Ordered in Other Encounters  Medication Dose Route Frequency Provider Last Rate Last Dose  . sodium chloride flush (NS) 0.9 % injection 10 mL  10 mL Intravenous PRN [place], Holli Humbles, NP   10 mL at 04/10/18 6468    Physical Findings: The patient is in no acute distress. Patient is alert and oriented.  height is 5' 3"  (1.6 m) and weight is 167 lb 2 oz (75.8 kg). Her oral temperature is 98.2 F (36.8 C). Her blood pressure is 139/64 and her pulse is 94. Her respiration is 18 and oxygen saturation is 100%.   Lungs are clear to auscultation bilaterally. Heart has regular rate and rhythm. No palpable cervical, supraclavicular, or axillary adenopathy. Abdomen soft, non-tender, normal bowel sounds.  Lab Findings: Lab Results  Component Value Date   WBC 3.2 (L) 05/29/2018   HGB 9.7 (L) 05/29/2018   HCT 29.8 (L) 05/29/2018   MCV 92.3 05/29/2018   PLT 105 (L) 05/29/2018    Radiographic Findings: Dg Chest 2 View  Result Date: 05/08/2018 CLINICAL DATA:  Multiple myeloma in relapse. New right anterior rib pain. Ex-smoker. EXAM: CHEST - 2 VIEW COMPARISON:  Chest radiographs dated 03/22/2018 and chest CTA dated 03/22/2018. FINDINGS: Normal sized heart. Tortuous and partially calcified thoracic aorta. Right jugular porta catheter tip at the superior cavoatrial junction. Clear lungs with mildly prominent interstitial markings and normal vascularity. Thoracic spine degenerative changes, T12 kyphoplasty and T10 compression deformity without significant change. IMPRESSION: No acute findings. Stable mild chronic interstitial lung disease. Electronically Signed   By: Claudie Revering M.D.   On: 05/08/2018 15:06    Impression:  Multiple myeloma not having achieved remission.  The patient is recovering from the effects of radiation.  She is doing well since palliative radiation therapy to the left lower  chest/upper abdominal area. She denies any pain in the treated area or breathing problems at this time.  Plan:  PRN follow-up in radiation oncology. Patient will continue close follow-up in medical oncology and continue on systemic treatment.  ____________________________________  Blair Promise, PhD, MD  This document serves as a record of services personally performed by Gery Pray, MD. It was created on his behalf by Rae Lips, a trained medical scribe. The creation of this record is based on the scribe's personal observations and the provider's statements to them. This document has been checked and approved by the attending provider.

## 2018-06-01 NOTE — Progress Notes (Signed)
Pt presents today for f/u with Dr. Sondra Come. Pt is accompanied by husband. Pt reports fatigue is improving but still present. Pt denies c/o pain. Pt denies difficulty breathing. Pt receiving chemotherapy weekly for 8 weeks. Med Onc is Dr. Marin Olp.   BP 139/64 (BP Location: Left Arm, Patient Position: Sitting)   Pulse 94   Temp 98.2 F (36.8 C) (Oral)   Resp 18   Ht 5\' 3"  (1.6 m)   Wt 167 lb 2 oz (75.8 kg)   SpO2 100%   BMI 29.60 kg/m   Wt Readings from Last 3 Encounters:  06/01/18 167 lb 2 oz (75.8 kg)  05/29/18 166 lb 4 oz (75.4 kg)  05/08/18 168 lb (76.2 kg)   Loma Sousa, RN BSN

## 2018-06-02 ENCOUNTER — Other Ambulatory Visit: Payer: Self-pay | Admitting: *Deleted

## 2018-06-02 DIAGNOSIS — C9000 Multiple myeloma not having achieved remission: Secondary | ICD-10-CM

## 2018-06-05 ENCOUNTER — Inpatient Hospital Stay (HOSPITAL_BASED_OUTPATIENT_CLINIC_OR_DEPARTMENT_OTHER): Payer: BLUE CROSS/BLUE SHIELD | Admitting: Hematology & Oncology

## 2018-06-05 ENCOUNTER — Other Ambulatory Visit: Payer: Self-pay

## 2018-06-05 ENCOUNTER — Inpatient Hospital Stay: Payer: BLUE CROSS/BLUE SHIELD

## 2018-06-05 ENCOUNTER — Encounter: Payer: Self-pay | Admitting: Hematology & Oncology

## 2018-06-05 VITALS — BP 103/61 | HR 93 | Temp 98.2°F | Resp 18

## 2018-06-05 DIAGNOSIS — C9000 Multiple myeloma not having achieved remission: Secondary | ICD-10-CM

## 2018-06-05 DIAGNOSIS — Z95828 Presence of other vascular implants and grafts: Secondary | ICD-10-CM

## 2018-06-05 DIAGNOSIS — C9002 Multiple myeloma in relapse: Secondary | ICD-10-CM

## 2018-06-05 DIAGNOSIS — Z5112 Encounter for antineoplastic immunotherapy: Secondary | ICD-10-CM | POA: Diagnosis not present

## 2018-06-05 DIAGNOSIS — M899 Disorder of bone, unspecified: Secondary | ICD-10-CM

## 2018-06-05 LAB — CMP (CANCER CENTER ONLY)
ALT: 14 U/L (ref 0–44)
AST: 33 U/L (ref 15–41)
Albumin: 3.6 g/dL (ref 3.5–5.0)
Alkaline Phosphatase: 28 U/L — ABNORMAL LOW (ref 38–126)
Anion gap: 3 — ABNORMAL LOW (ref 5–15)
BILIRUBIN TOTAL: 0.4 mg/dL (ref 0.3–1.2)
BUN: 18 mg/dL (ref 8–23)
CO2: 25 mmol/L (ref 22–32)
Calcium: 11 mg/dL — ABNORMAL HIGH (ref 8.9–10.3)
Chloride: 103 mmol/L (ref 98–111)
Creatinine: 1.03 mg/dL — ABNORMAL HIGH (ref 0.44–1.00)
GFR, Est AFR Am: >60 mL/min (ref >60)
GFR, Estimated: 58 mL/min — ABNORMAL LOW (ref >60)
GLUCOSE: 95 mg/dL (ref 70–99)
Potassium: 3.7 mmol/L (ref 3.5–5.1)
Sodium: 131 mmol/L — ABNORMAL LOW (ref 135–145)
Total Protein: 10.7 g/dL — ABNORMAL HIGH (ref 6.5–8.1)

## 2018-06-05 LAB — CBC WITH DIFFERENTIAL (CANCER CENTER ONLY)
Abs Immature Granulocytes: 0.02 10*3/uL (ref 0.00–0.07)
Basophils Absolute: 0 10*3/uL (ref 0.0–0.1)
Basophils Relative: 0 %
Eosinophils Absolute: 0.1 10*3/uL (ref 0.0–0.5)
Eosinophils Relative: 2 %
HCT: 30.5 % — ABNORMAL LOW (ref 36.0–46.0)
Hemoglobin: 9.7 g/dL — ABNORMAL LOW (ref 12.0–15.0)
Immature Granulocytes: 1 %
LYMPHS ABS: 0.9 10*3/uL (ref 0.7–4.0)
Lymphocytes Relative: 26 %
MCH: 29.8 pg (ref 26.0–34.0)
MCHC: 31.8 g/dL (ref 30.0–36.0)
MCV: 93.8 fL (ref 80.0–100.0)
MONOS PCT: 16 %
Monocytes Absolute: 0.5 10*3/uL (ref 0.1–1.0)
Neutro Abs: 1.9 10*3/uL (ref 1.7–7.7)
Neutrophils Relative %: 55 %
Platelet Count: 110 10*3/uL — ABNORMAL LOW (ref 150–400)
RBC: 3.25 MIL/uL — ABNORMAL LOW (ref 3.87–5.11)
RDW: 15.4 % (ref 11.5–15.5)
WBC Count: 3.5 10*3/uL — ABNORMAL LOW (ref 4.0–10.5)
nRBC: 0 % (ref 0.0–0.2)

## 2018-06-05 MED ORDER — SODIUM CHLORIDE 0.9 % IV SOLN: 10.0000 mg/kg | Freq: Once | INTRAVENOUS | Status: DC

## 2018-06-05 MED ORDER — SODIUM CHLORIDE 0.9% FLUSH
10.0000 mL | INTRAVENOUS | Status: DC | PRN
  Administered 2018-06-05: 10 mL via INTRAVENOUS
  Filled 2018-06-05: qty 10

## 2018-06-05 MED ORDER — PROCHLORPERAZINE MALEATE 10 MG PO TABS: 10.0000 mg | ORAL_TABLET | Freq: Once | ORAL | Status: DC

## 2018-06-05 MED ORDER — ZOLEDRONIC ACID 4 MG/100ML IV SOLN
4.0000 mg | Freq: Once | INTRAVENOUS | Status: AC
  Administered 2018-06-05: 4 mg via INTRAVENOUS
  Filled 2018-06-05: qty 100

## 2018-06-05 MED ORDER — ACETAMINOPHEN 325 MG PO TABS
ORAL_TABLET | ORAL | Status: AC
  Filled 2018-06-05: qty 2

## 2018-06-05 MED ORDER — SODIUM CHLORIDE 0.9% FLUSH
10.0000 mL | Freq: Once | INTRAVENOUS | Status: AC
  Administered 2018-06-05: 10 mL
  Filled 2018-06-05: qty 10

## 2018-06-05 MED ORDER — SODIUM CHLORIDE 0.9 % IV SOLN
Freq: Once | INTRAVENOUS | Status: AC
  Administered 2018-06-05: 09:00:00 via INTRAVENOUS
  Filled 2018-06-05: qty 250

## 2018-06-05 MED ORDER — HEPARIN SOD (PORK) LOCK FLUSH 100 UNIT/ML IV SOLN
500.0000 [IU] | Freq: Once | INTRAVENOUS | Status: AC
  Administered 2018-06-05: 500 [IU] via INTRAVENOUS
  Filled 2018-06-05: qty 5

## 2018-06-05 MED ORDER — DIPHENHYDRAMINE HCL 25 MG PO CAPS: 25.0000 mg | ORAL_CAPSULE | Freq: Once | ORAL | Status: DC

## 2018-06-05 MED ORDER — FAMOTIDINE IN NACL 20-0.9 MG/50ML-% IV SOLN
INTRAVENOUS | Status: AC
  Filled 2018-06-05: qty 50

## 2018-06-05 MED ORDER — FAMOTIDINE IN NACL 20-0.9 MG/50ML-% IV SOLN: 20.0000 mg | Freq: Once | INTRAVENOUS | Status: DC

## 2018-06-05 MED ORDER — DEXAMETHASONE SODIUM PHOSPHATE 10 MG/ML IJ SOLN: 8.0000 mg | Freq: Once | INTRAMUSCULAR | Status: DC

## 2018-06-05 MED ORDER — SODIUM CHLORIDE 0.9% FLUSH
10.0000 mL | INTRAVENOUS | Status: DC | PRN
  Filled 2018-06-05: qty 10

## 2018-06-05 MED ORDER — ACETAMINOPHEN 325 MG PO TABS: 650.0000 mg | ORAL_TABLET | Freq: Once | ORAL | Status: DC

## 2018-06-05 MED ORDER — HEPARIN SOD (PORK) LOCK FLUSH 100 UNIT/ML IV SOLN
500.0000 [IU] | Freq: Once | INTRAVENOUS | Status: DC | PRN
  Filled 2018-06-05: qty 5

## 2018-06-05 MED ORDER — DEXAMETHASONE SODIUM PHOSPHATE 10 MG/ML IJ SOLN
INTRAMUSCULAR | Status: AC
  Filled 2018-06-05: qty 1

## 2018-06-05 MED ORDER — DIPHENHYDRAMINE HCL 25 MG PO CAPS
ORAL_CAPSULE | ORAL | Status: AC
  Filled 2018-06-05: qty 1

## 2018-06-05 MED ORDER — PROCHLORPERAZINE MALEATE 10 MG PO TABS
ORAL_TABLET | ORAL | Status: AC
  Filled 2018-06-05: qty 1

## 2018-06-05 NOTE — Patient Instructions (Signed)

## 2018-06-05 NOTE — Patient Instructions (Addendum)
Zoledronic Acid injection (Hypercalcemia, Oncology) (Zometa) What is this medicine? ZOLEDRONIC ACID (ZOE le dron ik AS id) lowers the amount of calcium loss from bone. It is used to treat too much calcium in your blood from cancer. It is also used to prevent complications of cancer that has spread to the bone. This medicine may be used for other purposes; ask your health care provider or pharmacist if you have questions. COMMON BRAND NAME(S): Zometa What should I tell my health care provider before I take this medicine? They need to know if you have any of these conditions: -aspirin-sensitive asthma -cancer, especially if you are receiving medicines used to treat cancer -dental disease or wear dentures -infection -kidney disease -receiving corticosteroids like dexamethasone or prednisone -an unusual or allergic reaction to zoledronic acid, other medicines, foods, dyes, or preservatives -pregnant or trying to get pregnant -breast-feeding How should I use this medicine? This medicine is for infusion into a vein. It is given by a health care professional in a hospital or clinic setting. Talk to your pediatrician regarding the use of this medicine in children. Special care may be needed. Overdosage: If you think you have taken too much of this medicine contact a poison control center or emergency room at once. NOTE: This medicine is only for you. Do not share this medicine with others. What if I miss a dose? It is important not to miss your dose. Call your doctor or health care professional if you are unable to keep an appointment. What may interact with this medicine? -certain antibiotics given by injection -NSAIDs, medicines for pain and inflammation, like ibuprofen or naproxen -some diuretics like bumetanide, furosemide -teriparatide -thalidomide This list may not describe all possible interactions. Give your health care provider a list of all the medicines, herbs, non-prescription drugs,  or dietary supplements you use. Also tell them if you smoke, drink alcohol, or use illegal drugs. Some items may interact with your medicine. What should I watch for while using this medicine? Visit your doctor or health care professional for regular checkups. It may be some time before you see the benefit from this medicine. Do not stop taking your medicine unless your doctor tells you to. Your doctor may order blood tests or other tests to see how you are doing. Women should inform their doctor if they wish to become pregnant or think they might be pregnant. There is a potential for serious side effects to an unborn child. Talk to your health care professional or pharmacist for more information. You should make sure that you get enough calcium and vitamin D while you are taking this medicine. Discuss the foods you eat and the vitamins you take with your health care professional. Some people who take this medicine have severe bone, joint, and/or muscle pain. This medicine may also increase your risk for jaw problems or a broken thigh bone. Tell your doctor right away if you have severe pain in your jaw, bones, joints, or muscles. Tell your doctor if you have any pain that does not go away or that gets worse. Tell your dentist and dental surgeon that you are taking this medicine. You should not have major dental surgery while on this medicine. See your dentist to have a dental exam and fix any dental problems before starting this medicine. Take good care of your teeth while on this medicine. Make sure you see your dentist for regular follow-up appointments. What side effects may I notice from receiving this medicine? Side effects   that you should report to your doctor or health care professional as soon as possible: -allergic reactions like skin rash, itching or hives, swelling of the face, lips, or tongue -anxiety, confusion, or depression -breathing problems -changes in vision -eye pain -feeling faint  or lightheaded, falls -jaw pain, especially after dental work -mouth sores -muscle cramps, stiffness, or weakness -redness, blistering, peeling or loosening of the skin, including inside the mouth -trouble passing urine or change in the amount of urine Side effects that usually do not require medical attention (report to your doctor or health care professional if they continue or are bothersome): -bone, joint, or muscle pain -constipation -diarrhea -fever -hair loss -irritation at site where injected -loss of appetite -nausea, vomiting -stomach upset -trouble sleeping -trouble swallowing -weak or tired This list may not describe all possible side effects. Call your doctor for medical advice about side effects. You may report side effects to FDA at 1-800-FDA-1088. Where should I keep my medicine? This drug is given in a hospital or clinic and will not be stored at home. NOTE: This sheet is a summary. It may not cover all possible information. If you have questions about this medicine, talk to your doctor, pharmacist, or health care provider.  2019 Elsevier/Gold Standard (2013-09-22 14:19:39)  

## 2018-06-05 NOTE — Progress Notes (Signed)
Patient complains of numbness in right side of chin x 1 week. Calcium 11.0. Dr. Marin Olp notified. Dr. Marin Olp wants to see patient.

## 2018-06-05 NOTE — Progress Notes (Signed)
Hematology and Oncology Follow Up Visit  Barbara Cook [id number] [birth year] 63 y.o. 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  However, I think we run into a problem with respect to her myeloma.  I just do not think that she is responding to treatment.  Her calcium level keeps going up.  Her calcium level today is 11.  We need to give her something to help bring the calcium down.  Her protein also keeps going up.  Her total protein is now 10.7.  Her last M spike was up to 1.6 g/dL.  Her last IgG level was 3200 mg/dL.  I really think that we are going to need to get a bone marrow on her.  She has not had a bone marrow biopsy in several years.  I think a bone marrow biopsy is critical and we really need to see what her cytogenetics are.  I believe that we are now looking at referring her to [facility] the [facility] to see about CAR-T therapy.  I really believe that this is going to be the best way for [place] to try to help her myeloma.  She has not responded to the monoclonal antibodies.  She has a poor tolerance of Kyprolis.  She has been on Revlimid and pomalidomide.  I really believe that we have to look at CAR-T therapy for her.  I know this was quite upsetting for she and her husband.  We have been helping her and treating her for over a 4-5 years.  I  know that she has had some difficulties with therapy and has not wanted certain therapies.  I know that she never wanted to undergo stem cell transplant therapy.  She has a little bit of numbness under her lip.  This is more so on the right side.  Her appetite is doing okay.  She has IBS which is been a problem for her.  Right now, this seems to be relatively stable.  Overall, I was a performance status is ECOG 1.      Medications:  Allergies as of 06/05/2018      Reactions   Barium-containing Compounds Diarrhea   Oral Redicat gives pt severe diarrhea, causing delay in scan time, see previous ct scan notes for details   Codeine Palpitations      Medication List       Accurate as of June 05, 2018 10:22 AM. Always use your most recent med list.        acetaminophen 500 MG tablet Commonly known as:  TYLENOL Take 500 mg by mouth every 6 (six) hours as needed for mild pain or headache.   acyclovir 400 MG tablet Commonly known as:  ZOVIRAX Take 1 tablet (400 mg total) by mouth daily.   B-6 250 MG Tabs TAKE 1 TABLET (250 MG TOTAL) BY MOUTH DAILY.  dexamethasone 4 MG tablet Commonly known as:  DECADRON Take 5 tablets (39m) between 3 and 24 hours prior to chemotherapy. On non-chemotherapy days 8 and 22 , take 5 tablets (20 mg). Repeat every 28 days.   HYDROcodone-acetaminophen 5-325 MG tablet Commonly known as:  NORCO/VICODIN Take 1 tablet by mouth every 4 (four) hours as needed.   hyoscyamine 0.125 MG SL tablet Commonly known as:  LEVSIN SL PLACE 1 TABLET (0.125 MG TOTAL) UNDER THE TONGUE EVERY 4 (FOUR) HOURS AS NEEDED.   lidocaine-prilocaine cream Commonly known as:  EMLA Apply to affected area once   LORazepam 0.5 MG tablet Commonly known as:  ATIVAN Take 1 tablet (0.5 mg total) by mouth every 6 (six) hours as needed (Nausea or vomiting).   magnesium oxide 400 (241.3 Mg) MG tablet Commonly known as:  MAG-OX TAKE 1 TABLET BY MOUTH TWICE A DAY   montelukast 10 MG  tablet Commonly known as:  SINGULAIR Take 1 tablet (10 mg total) by mouth at bedtime.   multivitamin tablet Take 1 tablet by mouth every evening.   ondansetron 8 MG tablet Commonly known as:  ZOFRAN TAKE 1 TABLET BY MOUTH TWICE A DAY FOR NAUSEA AND VOMITING 1 TABLET 1 HOUR PRIOR TO CHEMOTHERAPY   ondansetron 8 MG tablet Commonly known as:  ZOFRAN Take 1 tablet (8 mg total) by mouth 2 (two) times daily as needed (Nausea or vomiting).   pantoprazole 40 MG tablet Commonly known as:  PROTONIX Take 1 tablet (40 mg total) by mouth daily.   polyethylene glycol powder powder Commonly known as:  GLYCOLAX/MIRALAX 1 capful daily as needed   prochlorperazine 10 MG tablet Commonly known as:  COMPAZINE Take 1 tablet (10 mg total) by mouth every 6 (six) hours as needed for nausea or vomiting.   prochlorperazine 10 MG tablet Commonly known as:  COMPAZINE Take 1 tablet (10 mg total) by mouth every 6 (six) hours as needed (Nausea or vomiting).   torsemide 20 MG tablet Commonly known as:  DEMADEX Take 1 tablet (20 mg total) by mouth daily as needed.   Vitamin D (Ergocalciferol) 1.25 MG (50000 UT) Caps capsule Commonly known as:  DRISDOL TAKE 1 CAPSULE (50,000 UNITS TOTAL) BY MOUTH ONCE A WEEK.       Allergies:  Allergies  Allergen Reactions  . Barium-Containing Compounds Diarrhea    Oral Redicat gives pt severe diarrhea, causing delay in scan time, see previous ct scan notes for details  . Codeine Palpitations    Past Medical History, Surgical history, Social history, and Family History were reviewed and updated.  Review of Systems: Review of Systems  Constitutional: Negative.   HENT: Negative.   Eyes: Negative.   Respiratory: Negative.   Cardiovascular: Negative.   Gastrointestinal: Negative.   Genitourinary: Negative.   Musculoskeletal: Positive for myalgias.  Skin: Negative.   Neurological: Negative.   Endo/Heme/Allergies: Negative.   Psychiatric/Behavioral: Negative.     .Marland Kitchen  Physical Exam:  oral temperature is 98.2 F (36.8 C). Her blood pressure is 103/61 and her pulse is 93. Her respiration is 18 and oxygen saturation is 100%.   Wt Readings from Last 3 Encounters:  06/01/18 167 lb 2 oz (75.8 kg)  05/29/18 166 lb 4 oz (75.4 kg)  05/08/18 168 lb (76.2 kg)    Physical Exam Vitals signs reviewed.  HENT:     Head: Normocephalic and atraumatic.  Eyes:     Pupils: Pupils are equal, round, and reactive to light.  Neck:  Musculoskeletal: Normal range of motion.  Cardiovascular:     Rate and Rhythm: Normal rate and regular rhythm.     Heart sounds: Normal heart sounds.  Pulmonary:     Effort: Pulmonary effort is normal.     Breath sounds: Normal breath sounds.  Abdominal:     General: Bowel sounds are normal.     Palpations: Abdomen is soft.  Musculoskeletal: Normal range of motion.        General: No tenderness or deformity.  Lymphadenopathy:     Cervical: No cervical adenopathy.  Skin:    General: Skin is warm and dry.     Findings: No erythema or rash.  Neurological:     Mental Status: She is alert and oriented to person, place, and time.  Psychiatric:        Behavior: Behavior normal.        Thought Content: Thought content normal.        Judgment: Judgment normal.      Lab Results  Component Value Date   WBC 3.5 (L) 06/05/2018   HGB 9.7 (L) 06/05/2018   HCT 30.5 (L) 06/05/2018   MCV 93.8 06/05/2018   PLT 110 (L) 06/05/2018   Lab Results  Component Value Date   FERRITIN 550 (H) 05/08/2018   IRON 70 05/08/2018   TIBC 238 05/08/2018   UIBC 168 05/08/2018   IRONPCTSAT 29 05/08/2018   Lab Results  Component Value Date   RETICCTPCT 1.2 05/08/2018   RBC 3.25 (L) 06/05/2018   RETICCTABS 35.9 12/25/2014   Lab Results  Component Value Date   KPAFRELGTCHN 56.8 (H) 05/08/2018   LAMBDASER 5.7 05/08/2018   KAPLAMBRATIO 9.96 (H) 05/08/2018   Lab Results  Component Value Date   IGGSERUM 3,182 (H) 05/08/2018   IGA 33  (L) 05/08/2018   IGMSERUM 6 (L) 05/08/2018   Lab Results  Component Value Date   TOTALPROTELP 7.4 04/17/2018   ALBUMINELP 3.7 04/17/2018   A1GS 0.2 04/17/2018   A2GS 0.6 04/17/2018   BETS 0.9 04/17/2018   BETA2SER 0.3 05/07/2015   GAMS 2.0 (H) 04/17/2018   MSPIKE 1.6 (H) 04/17/2018   SPEI Comment 02/06/2018     Chemistry      Component Value Date/Time   NA 131 (L) 06/05/2018 0810   NA 143 05/09/2017 0812   NA 139 02/20/2016 1207   K 3.7 06/05/2018 0810   K 4.0 05/09/2017 0812   K 3.8 02/20/2016 1207   CL 103 06/05/2018 0810   CL 108 05/09/2017 0812   CO2 25 06/05/2018 0810   CO2 26 05/09/2017 0812   CO2 29 02/20/2016 1207   BUN 18 06/05/2018 0810   BUN 13 05/09/2017 0812   BUN 22.3 02/20/2016 1207   CREATININE 1.03 (H) 06/05/2018 0810   CREATININE 0.9 05/09/2017 0812   CREATININE 1.3 (H) 02/20/2016 1207      Component Value Date/Time   CALCIUM 11.0 (H) 06/05/2018 0810   CALCIUM 8.3 05/09/2017 0812   CALCIUM 9.7 02/20/2016 1207   ALKPHOS 28 (L) 06/05/2018 0810   ALKPHOS 123 (H) 05/09/2017 0812   ALKPHOS 62 02/20/2016 1207   AST 33 06/05/2018 0810   AST 27 02/20/2016 1207   ALT 14 06/05/2018 0810   ALT 26 05/09/2017 0812   ALT 19 02/20/2016 1207   BILITOT 0.4 06/05/2018 0810   BILITOT 0.44 02/20/2016 1207      Impression and Plan: Barbara Cook is a very pleasant 63 yo African American female with IgG  kappa myeloma.   I will give her Zometa today.  I will make sure that she is Zometa over 45 minutes so that she will have less in the way of side effects.  I talked her at length about a bone marrow biopsy.  Again if she does not have one for several years.  We will try to get this set up for this week if possible.  I probably will refer her down to the [facility] cancer center in [place] to see Dr. Smitty Pluck.  I think he is the best myeloma doctor in the [place].  I really think that he will be able to find the right CAR-T therapy for her.  I spent about 40  minutes with Barbara Cook and her husband.  All the time spent face-to-face with them.  I arrange for the bone marrow biopsy be done.  I had to arrange for her Zometa and possible calcitonin.  I will see her back after we have the bone marrow biopsy result in.         [name], MD 1/27/202010:22 AM

## 2018-06-07 ENCOUNTER — Other Ambulatory Visit: Payer: Self-pay | Admitting: *Deleted

## 2018-06-07 ENCOUNTER — Telehealth: Payer: Self-pay | Admitting: *Deleted

## 2018-06-07 ENCOUNTER — Other Ambulatory Visit: Payer: Self-pay | Admitting: Family

## 2018-06-07 DIAGNOSIS — C9000 Multiple myeloma not having achieved remission: Secondary | ICD-10-CM

## 2018-06-07 DIAGNOSIS — R200 Anesthesia of skin: Secondary | ICD-10-CM

## 2018-06-07 NOTE — Telephone Encounter (Signed)
Patient notifying the office that her chin is now numb. She was in the office Monday and c/o numbness to her lips. She had an elevated calcium and was treated with Zometa.   Reviewed message with Dr Marin Olp and he would like patient to have an xray of her face. Asked if he wanted to bring patient in for lab work. She has a BM scheduled for Friday. Dr Marin Olp does not want to make any additional appointments at this time. He will wait for results of the BM.  Patient is aware of xray order. She isn't sure when she will get the xray done, but she knows to get it done.   Of note, referral was placed to Dr Melba Coon office this morning. They will contact patient to set up appointment within the next 3 weeks. Patient is aware and has many questions about Car-T. Explained to patient that Dr Melba Coon will go over everything with her and her husband and help to understand the procedure and answer any questions they may have.

## 2018-06-08 ENCOUNTER — Other Ambulatory Visit: Payer: Self-pay | Admitting: Physician Assistant

## 2018-06-08 ENCOUNTER — Other Ambulatory Visit: Payer: Self-pay | Admitting: Student

## 2018-06-09 ENCOUNTER — Encounter (HOSPITAL_COMMUNITY): Payer: Self-pay

## 2018-06-09 ENCOUNTER — Ambulatory Visit (HOSPITAL_COMMUNITY)
Admission: RE | Admit: 2018-06-09 | Discharge: 2018-06-09 | Disposition: A | Discharge status: Home / Self Care | Payer: BLUE CROSS/BLUE SHIELD | Source: Ambulatory Visit | Attending: Hematology & Oncology | Admitting: Hematology & Oncology

## 2018-06-09 ENCOUNTER — Other Ambulatory Visit: Payer: Self-pay

## 2018-06-09 DIAGNOSIS — K589 Irritable bowel syndrome without diarrhea: Secondary | ICD-10-CM | POA: Diagnosis not present

## 2018-06-09 DIAGNOSIS — Z885 Allergy status to narcotic agent status: Secondary | ICD-10-CM | POA: Insufficient documentation

## 2018-06-09 DIAGNOSIS — K219 Gastro-esophageal reflux disease without esophagitis: Secondary | ICD-10-CM | POA: Insufficient documentation

## 2018-06-09 DIAGNOSIS — M069 Rheumatoid arthritis, unspecified: Secondary | ICD-10-CM | POA: Diagnosis not present

## 2018-06-09 DIAGNOSIS — Z87891 Personal history of nicotine dependence: Secondary | ICD-10-CM | POA: Diagnosis not present

## 2018-06-09 DIAGNOSIS — Z79899 Other long term (current) drug therapy: Secondary | ICD-10-CM | POA: Insufficient documentation

## 2018-06-09 DIAGNOSIS — M353 Polymyalgia rheumatica: Secondary | ICD-10-CM | POA: Insufficient documentation

## 2018-06-09 DIAGNOSIS — C9000 Multiple myeloma not having achieved remission: Secondary | ICD-10-CM | POA: Insufficient documentation

## 2018-06-09 DIAGNOSIS — Z833 Family history of diabetes mellitus: Secondary | ICD-10-CM | POA: Insufficient documentation

## 2018-06-09 DIAGNOSIS — G0489 Other myelitis: Secondary | ICD-10-CM | POA: Diagnosis not present

## 2018-06-09 DIAGNOSIS — Z91041 Radiographic dye allergy status: Secondary | ICD-10-CM | POA: Insufficient documentation

## 2018-06-09 DIAGNOSIS — Z800 Family history of malignant neoplasm of digestive organs: Secondary | ICD-10-CM | POA: Insufficient documentation

## 2018-06-09 DIAGNOSIS — Z8261 Family history of arthritis: Secondary | ICD-10-CM | POA: Diagnosis not present

## 2018-06-09 DIAGNOSIS — D509 Iron deficiency anemia, unspecified: Secondary | ICD-10-CM | POA: Diagnosis not present

## 2018-06-09 DIAGNOSIS — Z801 Family history of malignant neoplasm of trachea, bronchus and lung: Secondary | ICD-10-CM | POA: Diagnosis not present

## 2018-06-09 LAB — CBC WITH DIFFERENTIAL/PLATELET
ABS IMMATURE GRANULOCYTES: 0.02 10*3/uL (ref 0.00–0.07)
Basophils Absolute: 0 10*3/uL (ref 0.0–0.1)
Basophils Relative: 0 %
Eosinophils Absolute: 0 10*3/uL (ref 0.0–0.5)
Eosinophils Relative: 1 %
HCT: 29.8 % — ABNORMAL LOW (ref 36.0–46.0)
Hemoglobin: 9.2 g/dL — ABNORMAL LOW (ref 12.0–15.0)
Immature Granulocytes: 0 %
LYMPHS ABS: 1 10*3/uL (ref 0.7–4.0)
Lymphocytes Relative: 20 %
MCH: 29.9 pg (ref 26.0–34.0)
MCHC: 30.9 g/dL (ref 30.0–36.0)
MCV: 96.8 fL (ref 80.0–100.0)
Monocytes Absolute: 0.6 10*3/uL (ref 0.1–1.0)
Monocytes Relative: 12 %
Neutro Abs: 3.1 10*3/uL (ref 1.7–7.7)
Neutrophils Relative %: 67 %
Platelets: 88 10*3/uL — ABNORMAL LOW (ref 150–400)
RBC: 3.08 MIL/uL — ABNORMAL LOW (ref 3.87–5.11)
RDW: 16.3 % — ABNORMAL HIGH (ref 11.5–15.5)
WBC: 4.7 10*3/uL (ref 4.0–10.5)
nRBC: 0 % (ref 0.0–0.2)

## 2018-06-09 LAB — PROTIME-INR
INR: 1.15
PROTHROMBIN TIME: 14.6 s (ref 11.4–15.2)

## 2018-06-09 LAB — APTT: aPTT: 34 seconds (ref 24–36)

## 2018-06-09 MED ORDER — FENTANYL CITRATE (PF) 100 MCG/2ML IJ SOLN
INTRAMUSCULAR | Status: AC
  Filled 2018-06-09: qty 2

## 2018-06-09 MED ORDER — LIDOCAINE HCL (PF) 1 % IJ SOLN
INTRAMUSCULAR | Status: AC | PRN
  Administered 2018-06-09: 10 mL

## 2018-06-09 MED ORDER — HEPARIN SOD (PORK) LOCK FLUSH 100 UNIT/ML IV SOLN
500.0000 [IU] | INTRAVENOUS | Status: AC | PRN
  Administered 2018-06-09: 500 [IU]
  Filled 2018-06-09: qty 5

## 2018-06-09 MED ORDER — MIDAZOLAM HCL 2 MG/2ML IJ SOLN
INTRAMUSCULAR | Status: AC | PRN
  Administered 2018-06-09 (×4): 1 mg via INTRAVENOUS

## 2018-06-09 MED ORDER — MIDAZOLAM HCL 2 MG/2ML IJ SOLN
INTRAMUSCULAR | Status: AC
  Filled 2018-06-09: qty 4

## 2018-06-09 MED ORDER — FENTANYL CITRATE (PF) 100 MCG/2ML IJ SOLN
INTRAMUSCULAR | Status: AC | PRN
  Administered 2018-06-09 (×2): 50 ug via INTRAVENOUS

## 2018-06-09 MED ORDER — SODIUM CHLORIDE 0.9 % IV SOLN
INTRAVENOUS | Status: DC
  Administered 2018-06-09: 08:00:00 via INTRAVENOUS

## 2018-06-09 NOTE — H&P (Signed)
Chief Complaint: Patient was seen in consultation today for bone marrow biopsy at the request of Ennever,Peter R  Referring Physician(s): Ennever,Peter R  Supervising Physician: Markus Daft  Patient Status: [facility] - Out-pt  History of Present Illness: Barbara Cook is a 63 y.o. female with myeloma. She is referred for bone marrow biopsy. PMHx, meds, labs, imaging, allergies reviewed. Feels well, no recent fevers, chills, illness. Has been NPO today as directed. Family at bedside.   Past Medical History:  Diagnosis Date  . Anemia   . Arthritis   . Arthropathy, unspecified, site unspecified   . Complication of anesthesia    02/13/14- had biospy in X-Ray- "twilight"  "I felt every thing"  . Constipation   . Endometriosis   . Family history of adverse reaction to anesthesia    Sister- patient will find out  . Fibroid   . GERD (gastroesophageal reflux disease)   . Heart murmur   . IBS (irritable bowel syndrome)   . Iron deficiency anemia due to chronic blood loss 08/15/2017  . Malabsorption of iron 08/15/2017  . MGUS (monoclonal gammopathy of unknown significance) 11-30-202013  . Multiple myeloma ([place]) 02/18/2014  . Myelitis ([place]) 2011   of bone- neck  . Neuromuscular disorder ([place])   . Polymyalgia rheumatica ([place])   . PONV (postoperative nausea and vomiting)    1990    Past Surgical History:  Procedure Laterality Date  . COLONOSCOPY    . Fistula repair surg    . IR GENERIC HISTORICAL  04/14/2016   IR FLUORO GUIDE PORT INSERTION RIGHT 04/14/2016 Greggory Keen, MD [facility]-INTERV RAD  . IR GENERIC HISTORICAL  04/14/2016   IR US GUIDE VASC ACCESS RIGHT 04/14/2016 Greggory Keen, MD [facility]-INTERV RAD  . KNEE ARTHROSCOPY Left   . NECK SURGERY  2011   Myletis- bone grafting- from her left hip  . RADIOLOGY WITH ANESTHESIA N/A 08/23/2014   Procedure: T12  ABLATION       (RADIOLOGY WITH ANESTHESIA);  Surgeon: Luanne Bras, MD;  Location: [facility];  Service: Radiology;  Laterality: N/A;  .  TUBAL LIGATION    . UMBILICAL HERNIA REPAIR     age 75    Allergies: Barium-containing compounds and Codeine  Medications: Prior to Admission medications   Medication Sig Start Date End Date Taking? Authorizing Provider  acetaminophen (TYLENOL) 500 MG tablet Take 500 mg by mouth every 6 (six) hours as needed for mild pain or headache.   Yes [provider]  magnesium oxide (MAG-OX) 400 (241.3 Mg) MG tablet TAKE 1 TABLET BY MOUTH TWICE A DAY Patient taking differently: Take 400 mg by mouth daily.  06/22/16  Yes [name], MD  montelukast (SINGULAIR) 10 MG tablet Take 1 tablet (10 mg total) by mouth at bedtime. 03/15/18  Yes [name], MD  Multiple Vitamin (MULTIVITAMIN) tablet Take 1 tablet by mouth every evening.    Yes [provider]  pantoprazole (PROTONIX) 40 MG tablet Take 1 tablet (40 mg total) by mouth daily. 03/20/18  Yes Zehr, Laban Emperor, PA-C  Pyridoxine HCl (B-6) 250 MG TABS TAKE 1 TABLET (250 MG TOTAL) BY MOUTH DAILY. Patient taking differently: Take 250 mg by mouth daily.  08/15/17  Yes [name], MD  torsemide (DEMADEX) 20 MG tablet Take 1 tablet (20 mg total) by mouth daily as needed. Patient taking differently: Take 20 mg by mouth daily as needed (fluid in ankles/swelling).  06/03/14  Yes [name], MD  Vitamin D,  Ergocalciferol, (DRISDOL) 50000 units CAPS capsule TAKE 1 CAPSULE (50,000 UNITS TOTAL) BY MOUTH ONCE A WEEK. 03/03/18  Yes [place], Holli Humbles, NP  acyclovir (ZOVIRAX) 400 MG tablet Take 1 tablet (400 mg total) by mouth daily. 04/11/17   [name], MD  HYDROcodone-acetaminophen (NORCO/VICODIN) 5-325 MG tablet Take 1 tablet by mouth every 4 (four) hours as needed. 03/22/18   Isla Pence, MD  hyoscyamine (LEVSIN SL) 0.125 MG SL tablet PLACE 1 TABLET (0.125 MG TOTAL) UNDER THE TONGUE EVERY 4 (FOUR) HOURS AS NEEDED. Patient taking differently: Place 0.125 mg under the tongue every 4 (four) hours as needed (IBS).  08/29/17    Pyrtle, Lajuan Lines, MD  ondansetron (ZOFRAN) 8 MG tablet TAKE 1 TABLET BY MOUTH TWICE A DAY FOR NAUSEA AND VOMITING 1 TABLET 1 HOUR PRIOR TO CHEMOTHERAPY Patient taking differently: Take 8 mg by mouth 2 (two) times daily as needed for nausea or vomiting (chemotherapy).  08/15/17   [name], MD  polyethylene glycol powder [facility]) powder 1 capful daily as needed Patient not taking: Reported on 06/05/2018 06/05/15   Mauri Pole, MD  prochlorperazine (COMPAZINE) 10 MG tablet Take 1 tablet (10 mg total) by mouth every 6 (six) hours as needed for nausea or vomiting. 04/11/17   [name], MD     Family History  Problem Relation Age of Onset  . Heart disease Mother   . Rheum arthritis Mother   . Lung cancer Maternal Uncle   . Throat cancer Maternal Aunt   . Diabetes Maternal Grandmother   . Hypertension Maternal Grandmother   . Heart disease Maternal Grandmother   . Heart disease Paternal Grandmother   . Colon cancer Neg Hx   . Esophageal cancer Neg Hx   . Stomach cancer Neg Hx   . Rectal cancer Neg Hx     Social History   Socioeconomic History  . Marital status: Married    Spouse name: [name]  . Number of children: 1  . Years of education: 92  . Highest education level: Not on file  Occupational History  . Occupation: Metallurgist: Civil engineer, contracting  Social Needs  . Financial resource strain: Not on file  . Food insecurity:    Worry: Not on file    Inability: Not on file  . Transportation needs:    Medical: Not on file    Non-medical: Not on file  Tobacco Use  . Smoking status: Former Smoker    Packs/day: 25.00    Years: 5.00    Pack years: 125.00    Types: Cigarettes    Start date: 05/26/1988    Last attempt to quit: 01/11/1994    Years since quitting: 24.4  . Smokeless tobacco: Never Used  . Tobacco comment: QUIT SMOKING 20 YEARS AGO  Substance and Sexual Activity  . Alcohol use: No    Alcohol/week: 0.0 standard drinks  . Drug  use: No  . Sexual activity: Not Currently    Comment: BTL  Lifestyle  . Physical activity:    Days per week: 1 day    Minutes per session: 30 min  . Stress: Only a little  Relationships  . Social connections:    Talks on phone: Patient refused    Gets together: Patient refused    Attends religious service: Patient refused    Active member of club or organization: Patient refused    Attends meetings of clubs or organizations: Patient refused  Relationship status: Patient refused  Other Topics Concern  . Not on file  Social History Narrative   Patient lives at home with her husband [name]). Patient works full time.   Education- High school   Right handed.   Caffeine- None    Review of Systems: A 12 point ROS discussed and pertinent positives are indicated in the HPI above.  All other systems are negative.  Review of Systems  Vital Signs: BP 133/73 (BP Location: Right Arm)   Pulse (!) 111   Temp 97.6 F (36.4 C) (Oral)   Resp 18   SpO2 100%   Physical Exam Constitutional:      Appearance: Normal appearance.  HENT:     Mouth/Throat:     Mouth: Mucous membranes are moist.     Pharynx: Oropharynx is clear.  Cardiovascular:     Rate and Rhythm: Normal rate and regular rhythm.     Heart sounds: Normal heart sounds.  Pulmonary:     Effort: Pulmonary effort is normal. No respiratory distress.     Breath sounds: Normal breath sounds.  Skin:    General: Skin is warm and dry.  Neurological:     General: No focal deficit present.     Mental Status: She is alert and oriented to person, place, and time.  Psychiatric:        Judgment: Judgment normal.     Imaging: No results found.  Labs:  CBC: Recent Labs    05/22/18 0910 05/29/18 0900 06/05/18 0810 06/09/18 0734  WBC 2.5* 3.2* 3.5* 4.7  HGB 9.1* 9.7* 9.7* 9.2*  HCT 28.4* 29.8* 30.5* 29.8*  PLT 106* 105* 110* 88*    COAGS: Recent Labs    06/09/18 0734  INR 1.15  APTT 34    BMP: Recent Labs     05/08/18 0920 05/22/18 0910 05/29/18 0900 06/05/18 0810  NA 135 136 134* 131*  K 4.1 3.7 3.9 3.7  CL 105 107 104 103  CO2 26 25 25 25   GLUCOSE 92 92 92 95  BUN 18 19 19 18   CALCIUM 9.1 9.5 10.7* 11.0*  CREATININE 1.02* 0.91 1.07* 1.03*  GFRNONAA 58* >60 55* 58*  GFRAA >60 >60 >60 >60    LIVER FUNCTION TESTS: Recent Labs    05/08/18 0920 05/22/18 0910 05/29/18 0900 06/05/18 0810  BILITOT 0.5 0.5 0.5 0.4  AST 21 26 33 33  ALT 14 13 15 14   ALKPHOS 34* 27* 30* 28*  PROT 8.5* 9.0* 10.0* 10.7*  ALBUMIN 3.7 3.5 3.7 3.6    TUMOR MARKERS: No results for input(s): AFPTM, CEA, CA199, CHROMGRNA in the last 8760 hours.  Assessment and Plan: Myeloma For CT guided bone marrow biopsy Labs ok Risks and benefits discussed with the patient including, but not limited to bleeding, infection, damage to adjacent structures or low yield requiring additional tests.  All of the patient's questions were answered, patient is agreeable to proceed. Consent signed and in chart.    Thank you for this interesting consult.  I greatly enjoyed meeting Barbara Cook and look forward to participating in their care.  A copy of this report was sent to the requesting provider on this date.  Electronically Signed: Ascencion Dike, PA-C 06/09/2018, 8:50 AM   I spent a total of 20 minutes in face to face in clinical consultation, greater than 50% of which was counseling/coordinating care for bone marrow biopsy

## 2018-06-09 NOTE — Procedures (Signed)
Interventional Radiology Procedure:   Indications: Myeloma  Procedure: CT guided bone marrow biopsy  Findings: 3 aspirates and 3 cores from right ilium  Complications: None     EBL: Minimal  Plan: Bedrest 1 hour.    Barbara Hernon R. Anselm Pancoast, MD  Pager: [phone]

## 2018-06-09 NOTE — Discharge Instructions (Addendum)
Moderate Conscious Sedation, Adult, Care After °These instructions provide you with information about caring for yourself after your procedure. Your health care provider may also give you more specific instructions. Your treatment has been planned according to current medical practices, but problems sometimes occur. Call your health care provider if you have any problems or questions after your procedure. °What can I expect after the procedure? °After your procedure, it is common: °· To feel sleepy for several hours. °· To feel clumsy and have poor balance for several hours. °· To have poor judgment for several hours. °· To vomit if you eat too soon. °Follow these instructions at home: °For at least 24 hours after the procedure: ° °· Do not: °? Participate in activities where you could fall or become injured. °? Drive. °? Use heavy machinery. °? Drink alcohol. °? Take sleeping pills or medicines that cause drowsiness. °? Make important decisions or sign legal documents. °? Take care of children on your own. °· Rest. °Eating and drinking °· Follow the diet recommended by your health care provider. °· If you vomit: °? Drink water, juice, or soup when you can drink without vomiting. °? Make sure you have little or no nausea before eating solid foods. °General instructions °· Have a responsible adult stay with you until you are awake and alert. °· Take over-the-counter and prescription medicines only as told by your health care provider. °· If you smoke, do not smoke without supervision. °· Keep all follow-up visits as told by your health care provider. This is important. °Contact a health care provider if: °· You keep feeling nauseous or you keep vomiting. °· You feel light-headed. °· You develop a rash. °· You have a fever. °Get help right away if: °· You have trouble breathing. °This information is not intended to replace advice given to you by your health care provider. Make sure you discuss any questions you have  with your health care provider. °Document Released: 02/14/2013 Document Revised: 09/29/2015 Document Reviewed: 08/16/2015 °Elsevier Interactive Patient Education © 2019 [employer] ° ° ° °You may shower and remove your dressing tomorrow. ° °Bone Marrow Aspiration and Bone Marrow Biopsy, Adult, Care After °This sheet gives you information about how to care for yourself after your procedure. Your health care provider may also give you more specific instructions. If you have problems or questions, contact your health care provider. °What can I expect after the procedure? °After the procedure, it is common to have: °· Mild pain and tenderness. °· Swelling. °· Bruising. °Follow these instructions at home: °Puncture site care ° °  ° °· Follow instructions from your health care provider about how to take care of the puncture site. Make sure you: °? Wash your hands with soap and water before you change your bandage (dressing). If soap and water are not available, use hand sanitizer. °? Change your dressing as told by your health care provider. °· Check your puncture site every day for signs of infection. Check for: °? More redness, swelling, or pain. °? More fluid or blood. °? Warmth. °? Pus or a bad smell. °General instructions °· Take over-the-counter and prescription medicines only as told by your health care provider. °· Do not take baths, swim, or use a hot tub until your health care provider approves. Ask if you can take a shower or have a sponge bath. °· Return to your normal activities as told by your health care provider. Ask your health care provider what activities are safe for   you. °· Do not drive for 24 hours if you were given a medicine to help you relax (sedative) during your procedure. °· Keep all follow-up visits as told by your health care provider. This is important. °Contact a health care provider if: °· Your pain is not controlled with medicine. °Get help right away if: °· You have a fever. °· You  have more redness, swelling, or pain around the puncture site. °· You have more fluid or blood coming from the puncture site. °· Your puncture site feels warm to the touch. °· You have pus or a bad smell coming from the puncture site. °These symptoms may represent a serious problem that is an emergency. Do not wait to see if the symptoms will go away. Get medical help right away. Call your local emergency services (911 in the [place] Do not drive yourself to the hospital. °Summary °· After the procedure, it is common to have mild pain, tenderness, swelling, and bruising. °· Follow instructions from your health care provider about how to take care of the puncture site. °· Get help right away if you have any symptoms of infection or if you have more blood or fluid coming from the puncture site. °This information is not intended to replace advice given to you by your health care provider. Make sure you discuss any questions you have with your health care provider. °Document Released: 11/13/2004 Document Revised: 08/09/2017 Document Reviewed: 10/08/2015 °Elsevier Interactive Patient Education © 2019 [employer] Inc[employer] ° °

## 2018-06-12 ENCOUNTER — Other Ambulatory Visit: Payer: BLUE CROSS/BLUE SHIELD

## 2018-06-12 ENCOUNTER — Ambulatory Visit: Payer: BLUE CROSS/BLUE SHIELD

## 2018-06-13 ENCOUNTER — Telehealth: Payer: Self-pay | Admitting: *Deleted

## 2018-06-13 NOTE — Telephone Encounter (Signed)
Amber with Dr. Philipp Ovens called and stated,"Ms. Barbara Cook's appointment with Dr. Philipp Ovens is scheduled for March 5th at 11:00 am. We need the pathology report from her bone marrow biopsy when it is available. Our fax number is [phone]. If you need to speak with me, my clinic number is [phone]."

## 2018-06-15 ENCOUNTER — Telehealth: Payer: Self-pay | Admitting: *Deleted

## 2018-06-15 NOTE — Telephone Encounter (Signed)
Patient called and stated,"I had a bone marrow biopsy last Friday. Dr. Anselm Pancoast had to stick me three times. My right side has a black spot in the middle, and a pinkish circle around it. I don't have a fever, no swelling, odor or drainage noted. I've been laying on a heating pad to see if that helps it. Do I need an antibiotic? Instructed patient to review the discharge papers with me over the phone. One, told her to stop the heating pad and apply ice to the area. Second, monitor her temperature daily. Third, if she starts to have any pain, take Tylenol as needed. Fourth, alternate positions and stay off the right hip. Keep your appointment with Dr. Marin Olp on Monday, 06/19/2018, and he can asssess it then. Call this office if you have any further questions or concerns. She verbalized understanding.

## 2018-06-16 ENCOUNTER — Encounter (HOSPITAL_COMMUNITY): Payer: Self-pay | Admitting: Hematology & Oncology

## 2018-06-19 ENCOUNTER — Other Ambulatory Visit: Payer: Self-pay | Admitting: Family

## 2018-06-19 ENCOUNTER — Inpatient Hospital Stay: Payer: BLUE CROSS/BLUE SHIELD | Attending: Hematology & Oncology | Admitting: Hematology & Oncology

## 2018-06-19 ENCOUNTER — Other Ambulatory Visit: Payer: Self-pay | Admitting: *Deleted

## 2018-06-19 ENCOUNTER — Ambulatory Visit: Payer: BLUE CROSS/BLUE SHIELD

## 2018-06-19 ENCOUNTER — Inpatient Hospital Stay: Payer: BLUE CROSS/BLUE SHIELD

## 2018-06-19 ENCOUNTER — Encounter: Payer: Self-pay | Admitting: Hematology & Oncology

## 2018-06-19 ENCOUNTER — Other Ambulatory Visit: Payer: Self-pay

## 2018-06-19 VITALS — BP 140/70 | HR 96

## 2018-06-19 VITALS — BP 137/77 | HR 104 | Temp 98.2°F | Resp 18 | Wt 162.0 lb

## 2018-06-19 DIAGNOSIS — K589 Irritable bowel syndrome without diarrhea: Secondary | ICD-10-CM | POA: Insufficient documentation

## 2018-06-19 DIAGNOSIS — D649 Anemia, unspecified: Secondary | ICD-10-CM | POA: Insufficient documentation

## 2018-06-19 DIAGNOSIS — R531 Weakness: Secondary | ICD-10-CM | POA: Diagnosis not present

## 2018-06-19 DIAGNOSIS — M899 Disorder of bone, unspecified: Secondary | ICD-10-CM

## 2018-06-19 DIAGNOSIS — Z9221 Personal history of antineoplastic chemotherapy: Secondary | ICD-10-CM

## 2018-06-19 DIAGNOSIS — C9000 Multiple myeloma not having achieved remission: Secondary | ICD-10-CM

## 2018-06-19 DIAGNOSIS — C9002 Multiple myeloma in relapse: Secondary | ICD-10-CM

## 2018-06-19 LAB — CBC WITH DIFFERENTIAL (CANCER CENTER ONLY)
Abs Immature Granulocytes: 0.05 10*3/uL (ref 0.00–0.07)
Basophils Absolute: 0 10*3/uL (ref 0.0–0.1)
Basophils Relative: 0 %
Eosinophils Absolute: 0.1 10*3/uL (ref 0.0–0.5)
Eosinophils Relative: 2 %
HEMATOCRIT: 27.1 % — AB (ref 36.0–46.0)
Hemoglobin: 8.6 g/dL — ABNORMAL LOW (ref 12.0–15.0)
Immature Granulocytes: 1 %
Lymphocytes Relative: 27 %
Lymphs Abs: 0.9 10*3/uL (ref 0.7–4.0)
MCH: 29.6 pg (ref 26.0–34.0)
MCHC: 31.7 g/dL (ref 30.0–36.0)
MCV: 93.1 fL (ref 80.0–100.0)
Monocytes Absolute: 0.4 10*3/uL (ref 0.1–1.0)
Monocytes Relative: 13 %
NRBC: 0 % (ref 0.0–0.2)
Neutro Abs: 2 10*3/uL (ref 1.7–7.7)
Neutrophils Relative %: 57 %
Platelet Count: 102 10*3/uL — ABNORMAL LOW (ref 150–400)
RBC: 2.91 MIL/uL — ABNORMAL LOW (ref 3.87–5.11)
RDW: 15.7 % — ABNORMAL HIGH (ref 11.5–15.5)
WBC Count: 3.5 10*3/uL — ABNORMAL LOW (ref 4.0–10.5)

## 2018-06-19 LAB — CMP (CANCER CENTER ONLY)
ALBUMIN: 3.2 g/dL — AB (ref 3.5–5.0)
ALT: 11 U/L (ref 0–44)
AST: 44 U/L — ABNORMAL HIGH (ref 15–41)
Alkaline Phosphatase: 26 U/L — ABNORMAL LOW (ref 38–126)
Anion gap: 5 (ref 5–15)
BUN: 18 mg/dL (ref 8–23)
CO2: 21 mmol/L — ABNORMAL LOW (ref 22–32)
Calcium: 10.3 mg/dL (ref 8.9–10.3)
Chloride: 107 mmol/L (ref 98–111)
Creatinine: 1.22 mg/dL — ABNORMAL HIGH (ref 0.44–1.00)
GFR, Est AFR Am: 55 mL/min — ABNORMAL LOW (ref >60)
GFR, Estimated: 47 mL/min — ABNORMAL LOW (ref >60)
GLUCOSE: 86 mg/dL (ref 70–99)
Potassium: 3.6 mmol/L (ref 3.5–5.1)
Sodium: 133 mmol/L — ABNORMAL LOW (ref 135–145)
Total Bilirubin: 0.4 mg/dL (ref 0.3–1.2)
Total Protein: 11.8 g/dL — ABNORMAL HIGH (ref 6.5–8.1)

## 2018-06-19 LAB — PREPARE RBC (CROSSMATCH)

## 2018-06-19 LAB — LACTATE DEHYDROGENASE: LDH: 766 U/L — ABNORMAL HIGH (ref 98–192)

## 2018-06-19 MED ORDER — KETOROLAC TROMETHAMINE 15 MG/ML IJ SOLN
INTRAMUSCULAR | Status: AC
  Filled 2018-06-19: qty 2

## 2018-06-19 MED ORDER — SODIUM CHLORIDE 0.9 % IV SOLN
INTRAVENOUS | Status: AC
  Administered 2018-06-19: 10:00:00 via INTRAVENOUS
  Filled 2018-06-19 (×2): qty 250

## 2018-06-19 MED ORDER — TRAMADOL HCL 50 MG PO TABS: 50.0000 mg | ORAL_TABLET | Freq: Four times a day (QID) | ORAL | 0 refills | Status: AC | PRN

## 2018-06-19 MED ORDER — HEPARIN SOD (PORK) LOCK FLUSH 100 UNIT/ML IV SOLN
500.0000 [IU] | Freq: Once | INTRAVENOUS | Status: AC
  Administered 2018-06-19: 500 [IU] via INTRAVENOUS
  Filled 2018-06-19: qty 5

## 2018-06-19 MED ORDER — SODIUM CHLORIDE 0.9% FLUSH
10.0000 mL | INTRAVENOUS | Status: DC | PRN
  Administered 2018-06-19: 10 mL via INTRAVENOUS
  Filled 2018-06-19: qty 10

## 2018-06-19 MED ORDER — KETOROLAC TROMETHAMINE 15 MG/ML IJ SOLN
30.0000 mg | Freq: Once | INTRAMUSCULAR | Status: AC
  Administered 2018-06-19: 30 mg via INTRAVENOUS
  Filled 2018-06-19: qty 2

## 2018-06-19 MED ORDER — DOXEPIN HCL 3 MG PO TABS: 3.0000 mg | ORAL_TABLET | Freq: Every evening | ORAL | 3 refills | Status: AC | PRN

## 2018-06-19 NOTE — Patient Instructions (Signed)

## 2018-06-19 NOTE — Progress Notes (Signed)
Hematology and Oncology Follow Up Visit  Barbara Cook [id number] 02/04/1956 63 y.o. 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  Unfortunately, we clearly have disease progression.  She had a bone marrow biopsy done last week.  The pathology report ([id number]) showed myeloma.  She had over 50% plasma cells in general.  There was some areas that had 95%.  We also did cytogenetics.  The cytogenetics showed obvious chromosomal abnormalities.  She had a normal cell line.  A second cell line however showed gain of chromosomes 5, 9, 11, 15 and 19.  There was loss of chromosome 4 and chromosome X.  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  I think we now can understand why she is no longer responding.  I know that she has tried.  She has had some issues with her irritable bowel syndrome that has made it difficult to keep her on schedule.  I have already spoken to the myeloma expert down in [place], Dr. Smitty Pluck.  He will try to see her this week.  Her husband is having health problems.  He needs to have gallbladder surgery.  He supposed to have this on Friday.  I think that the best option for Barbara Cook is going to be CAR-T therapy.  I think with CAR-T therapy, we should be able to get her myeloma under much better control.  She is weak.  Her  hemoglobin is 8.6.  We will have to transfuse her.  I think this will make her feel a little bit better.  She is not hypercalcemic.  She does not have any fever.  She has no nausea or vomiting.  She currently has no diarrhea.  Overall, her performance status is ECOG 1.     Medications:  Allergies as of 06/19/2018      Reactions   Barium-containing Compounds Diarrhea   Oral Redicat gives pt severe diarrhea, causing delay in scan time, see previous ct scan notes for details   Codeine Palpitations      Medication List       Accurate as of June 19, 2018  5:43 PM. Always use your most recent med list.        acetaminophen 500 MG tablet Commonly known as:  TYLENOL Take 500 mg by mouth every 6 (six) hours as needed for mild pain or headache.   acyclovir 400 MG tablet Commonly known as:  ZOVIRAX Take 1 tablet (400 mg total) by mouth daily.   B-6 250 MG Tabs TAKE 1 TABLET (250 MG TOTAL) BY MOUTH DAILY.   Doxepin HCl 3 MG Tabs Commonly known as:  SILENOR Take 1 tablet (3 mg total) by mouth at bedtime as needed.   HYDROcodone-acetaminophen 5-325 MG tablet Commonly known as:  NORCO/VICODIN Take 1 tablet by mouth every 4 (four) hours as needed.   hyoscyamine 0.125 MG SL tablet  Commonly known as:  LEVSIN SL PLACE 1 TABLET (0.125 MG TOTAL) UNDER THE TONGUE EVERY 4 (FOUR) HOURS AS NEEDED.   magnesium oxide 400 (241.3 Mg) MG tablet Commonly known as:  MAG-OX TAKE 1 TABLET BY MOUTH TWICE A DAY   multivitamin tablet Take 1 tablet by mouth every evening.   ondansetron 8 MG tablet Commonly known as:  ZOFRAN TAKE 1 TABLET BY MOUTH TWICE A DAY FOR NAUSEA AND VOMITING 1 TABLET 1 HOUR PRIOR TO CHEMOTHERAPY   pantoprazole 40 MG tablet Commonly known as:  PROTONIX Take 1 tablet (40 mg total) by mouth daily.   polyethylene glycol powder powder Commonly known as:  GLYCOLAX/MIRALAX 1 capful daily as needed   prochlorperazine 10 MG tablet Commonly known as:  COMPAZINE Take 1  tablet (10 mg total) by mouth every 6 (six) hours as needed for nausea or vomiting.   torsemide 20 MG tablet Commonly known as:  DEMADEX Take 1 tablet (20 mg total) by mouth daily as needed.   traMADol 50 MG tablet Commonly known as:  ULTRAM Take 1 tablet (50 mg total) by mouth every 6 (six) hours as needed.   Vitamin D (Ergocalciferol) 1.25 MG (50000 UT) Caps capsule Commonly known as:  DRISDOL TAKE 1 CAPSULE (50,000 UNITS TOTAL) BY MOUTH ONCE A WEEK.       Allergies:  Allergies  Allergen Reactions  . Barium-Containing Compounds Diarrhea    Oral Redicat gives pt severe diarrhea, causing delay in scan time, see previous ct scan notes for details  . Codeine Palpitations    Past Medical History, Surgical history, Social history, and Family History were reviewed and updated.  Review of Systems: Review of Systems  Constitutional: Negative.   HENT: Negative.   Eyes: Negative.   Respiratory: Negative.   Cardiovascular: Negative.   Gastrointestinal: Negative.   Genitourinary: Negative.   Musculoskeletal: Positive for myalgias.  Skin: Negative.   Neurological: Negative.   Endo/Heme/Allergies: Negative.   Psychiatric/Behavioral: Negative.    Marland Kitchen   Physical Exam:  weight is 162 lb (73.5 kg). Her oral temperature is 98.2 F (36.8 C). Her blood pressure is 137/77 and her pulse is 104 (abnormal). Her respiration is 18 and oxygen saturation is 100%.   Wt Readings from Last 3 Encounters:  06/19/18 162 lb (73.5 kg)  06/01/18 167 lb 2 oz (75.8 kg)  05/29/18 166 lb 4 oz (75.4 kg)    Physical Exam Vitals signs reviewed.  HENT:     Head: Normocephalic and atraumatic.  Eyes:     Pupils: Pupils are equal, round, and reactive to light.  Neck:     Musculoskeletal: Normal range of motion.  Cardiovascular:     Rate and Rhythm: Normal rate and regular rhythm.     Heart sounds: Normal heart sounds.  Pulmonary:     Effort: Pulmonary effort is normal.     Breath sounds: Normal  breath sounds.  Abdominal:     General: Bowel sounds are normal.     Palpations: Abdomen is soft.  Musculoskeletal: Normal range of motion.        General: No tenderness or deformity.  Lymphadenopathy:     Cervical: No cervical adenopathy.  Skin:    General: Skin is warm and dry.     Findings: No erythema or rash.  Neurological:     Mental Status: She is alert and oriented to person, place, and time.  Psychiatric:        Behavior: Behavior normal.  Thought Content: Thought content normal.        Judgment: Judgment normal.      Lab Results  Component Value Date   WBC 3.5 (L) 06/19/2018   HGB 8.6 (L) 06/19/2018   HCT 27.1 (L) 06/19/2018   MCV 93.1 06/19/2018   PLT 102 (L) 06/19/2018   Lab Results  Component Value Date   FERRITIN 550 (H) 05/08/2018   IRON 70 05/08/2018   TIBC 238 05/08/2018   UIBC 168 05/08/2018   IRONPCTSAT 29 05/08/2018   Lab Results  Component Value Date   RETICCTPCT 1.2 05/08/2018   RBC 2.91 (L) 06/19/2018   RETICCTABS 35.9 12/25/2014   Lab Results  Component Value Date   KPAFRELGTCHN 56.8 (H) 05/08/2018   LAMBDASER 5.7 05/08/2018   KAPLAMBRATIO 9.96 (H) 05/08/2018   Lab Results  Component Value Date   IGGSERUM 3,182 (H) 05/08/2018   IGA 33 (L) 05/08/2018   IGMSERUM 6 (L) 05/08/2018   Lab Results  Component Value Date   TOTALPROTELP 7.4 04/17/2018   ALBUMINELP 3.7 04/17/2018   A1GS 0.2 04/17/2018   A2GS 0.6 04/17/2018   BETS 0.9 04/17/2018   BETA2SER 0.3 05/07/2015   GAMS 2.0 (H) 04/17/2018   MSPIKE 1.6 (H) 04/17/2018   SPEI Comment 02/06/2018     Chemistry      Component Value Date/Time   NA 133 (L) 06/19/2018 0855   NA 143 05/09/2017 0812   NA 139 02/20/2016 1207   K 3.6 06/19/2018 0855   K 4.0 05/09/2017 0812   K 3.8 02/20/2016 1207   CL 107 06/19/2018 0855   CL 108 05/09/2017 0812   CO2 21 (L) 06/19/2018 0855   CO2 26 05/09/2017 0812   CO2 29 02/20/2016 1207   BUN 18 06/19/2018 0855   BUN 13 05/09/2017 0812     BUN 22.3 02/20/2016 1207   CREATININE 1.22 (H) 06/19/2018 0855   CREATININE 0.9 05/09/2017 0812   CREATININE 1.3 (H) 02/20/2016 1207      Component Value Date/Time   CALCIUM 10.3 06/19/2018 0855   CALCIUM 8.3 05/09/2017 0812   CALCIUM 9.7 02/20/2016 1207   ALKPHOS 26 (L) 06/19/2018 0855   ALKPHOS 123 (H) 05/09/2017 0812   ALKPHOS 62 02/20/2016 1207   AST 44 (H) 06/19/2018 0855   AST 27 02/20/2016 1207   ALT 11 06/19/2018 0855   ALT 26 05/09/2017 0812   ALT 19 02/20/2016 1207   BILITOT 0.4 06/19/2018 0855   BILITOT 0.44 02/20/2016 1207      Impression and Plan: Barbara Cook is a very pleasant 63 yo African American female with IgG kappa myeloma.   Again, the bone marrow clearly shows that her myeloma has taken on new cytogenetic abnormalities.  I think it is these cytogenetic abnormalities that are allowing her to have a good amount of resistance.  Hopefully, she will be able to go down to [place] this week.  Hopefully, there will be a CAR-T protocol that she will qualify for.  I know that there are newer CAR T products that are being developed.  We will give her 2 units of blood tomorrow.  She is not hypercalcemic today.  We will certainly try our best to improve her status so that she will be able to take CAR-T therapy.    I would like to see her back in another 3 weeks.      [name], MD 2/10/20205:43 PM

## 2018-06-19 NOTE — Patient Instructions (Signed)
Dehydration, Adult  Dehydration is when there is not enough fluid or water in your body. This happens when you lose more fluids than you take in. Dehydration can range from mild to very bad. It should be treated right away to keep it from getting very bad. Symptoms of mild dehydration may include:  Thirst.  Dry lips.  Slightly dry mouth.  Dry, warm skin.  Dizziness. Symptoms of moderate dehydration may include:  Very dry mouth.  Muscle cramps.  Dark pee (urine). Pee may be the color of tea.  Your body making less pee.  Your eyes making fewer tears.  Heartbeat that is uneven or faster than normal (palpitations).  Headache.  Light-headedness, especially when you stand up from sitting.  Fainting (syncope). Symptoms of very bad dehydration may include:  Changes in skin, such as: ? Cold and clammy skin. ? Blotchy (mottled) or pale skin. ? Skin that does not quickly return to normal after being lightly pinched and let go (poor skin turgor).  Changes in body fluids, such as: ? Feeling very thirsty. ? Your eyes making fewer tears. ? Not sweating when body temperature is high, such as in hot weather. ? Your body making very little pee.  Changes in vital signs, such as: ? Weak pulse. ? Pulse that is more than 100 beats a minute when you are sitting still. ? Fast breathing. ? Low blood pressure.  Other changes, such as: ? Sunken eyes. ? Cold hands and feet. ? Confusion. ? Lack of energy (lethargy). ? Trouble waking up from sleep. ? Short-term weight loss. ? Unconsciousness. Follow these instructions at home:   If told by your doctor, drink an ORS: ? Make an ORS by using instructions on the package. ? Start by drinking small amounts, about  cup (120 mL) every 5-10 minutes. ? Slowly drink more until you have had the amount that your doctor said to have.  Drink enough clear fluid to keep your pee clear or pale yellow. If you were told to drink an ORS, finish the  ORS first, then start slowly drinking clear fluids. Drink fluids such as: ? Water. Do not drink only water by itself. Doing that can make the salt (sodium) level in your body get too low (hyponatremia). ? Ice chips. ? Fruit juice that you have added water to (diluted). ? Low-calorie sports drinks.  Avoid: ? Alcohol. ? Drinks that have a lot of sugar. These include high-calorie sports drinks, fruit juice that does not have water added, and soda. ? Caffeine. ? Foods that are greasy or have a lot of fat or sugar.  Take over-the-counter and prescription medicines only as told by your doctor.  Do not take salt tablets. Doing that can make the salt level in your body get too high (hypernatremia).  Eat foods that have minerals (electrolytes). Examples include bananas, oranges, potatoes, tomatoes, and spinach.  Keep all follow-up visits as told by your doctor. This is important. Contact a doctor if:  You have belly (abdominal) pain that: ? Gets worse. ? Stays in one area (localizes).  You have a rash.  You have a stiff neck.  You get angry or annoyed more easily than normal (irritability).  You are more sleepy than normal.  You have a harder time waking up than normal.  You feel: ? Weak. ? Dizzy. ? Very thirsty.  You have peed (urinated) only a small amount of very dark pee during 6-8 hours. Get help right away if:  You have   symptoms of very bad dehydration.  You cannot drink fluids without throwing up (vomiting).  Your symptoms get worse with treatment.  You have a fever.  You have a very bad headache.  You are throwing up or having watery poop (diarrhea) and it: ? Gets worse. ? Does not go away.  You have blood or something green (bile) in your throw-up.  You have blood in your poop (stool). This may cause poop to look black and tarry.  You have not peed in 6-8 hours.  You pass out (faint).  Your heart rate when you are sitting still is more than 100 beats a  minute.  You have trouble breathing. This information is not intended to replace advice given to you by your health care provider. Make sure you discuss any questions you have with your health care provider. Document Released: 02/20/2009 Document Revised: 11/14/2015 Document Reviewed: 06/20/2015 Elsevier Interactive Patient Education  2019 Elsevier Inc.  

## 2018-06-20 LAB — IGG, IGA, IGM
IGM (IMMUNOGLOBULIN M), SRM: 6 mg/dL — AB (ref 26–217)
IgA: 19 mg/dL — ABNORMAL LOW (ref 87–352)
IgG (Immunoglobin G), Serum: 8051 mg/dL — ABNORMAL HIGH (ref 700–1600)

## 2018-06-20 LAB — KAPPA/LAMBDA LIGHT CHAINS
KAPPA FREE LGHT CHN: 181.1 mg/L — AB (ref 3.3–19.4)
Kappa, lambda light chain ratio: 53.26 — ABNORMAL HIGH (ref 0.26–1.65)
Lambda free light chains: 3.4 mg/L — ABNORMAL LOW (ref 5.7–26.3)

## 2018-06-20 LAB — BETA 2 MICROGLOBULIN, SERUM: Beta-2 Microglobulin: 12.9 mg/L — ABNORMAL HIGH (ref 0.6–2.4)

## 2018-06-21 LAB — IMMUNOFIXATION REFLEX, SERUM
IGG (IMMUNOGLOBIN G), SERUM: 7896 mg/dL — AB (ref 700–1600)
IgA: 19 mg/dL — ABNORMAL LOW (ref 87–352)
IgM (Immunoglobulin M), Srm: 7 mg/dL — ABNORMAL LOW (ref 26–217)

## 2018-06-21 LAB — PROTEIN ELECTROPHORESIS, SERUM, WITH REFLEX
A/G Ratio: 0.5 — ABNORMAL LOW (ref 0.7–1.7)
ALBUMIN ELP: 3.7 g/dL (ref 2.9–4.4)
Alpha-1-Globulin: 0.3 g/dL (ref 0.0–0.4)
Alpha-2-Globulin: 0.9 g/dL (ref 0.4–1.0)
Beta Globulin: 0.9 g/dL (ref 0.7–1.3)
Gamma Globulin: 6.1 g/dL — ABNORMAL HIGH (ref 0.4–1.8)
Globulin, Total: 8.2 g/dL — ABNORMAL HIGH (ref 2.2–3.9)
M-Spike, %: 5.7 g/dL — ABNORMAL HIGH
SPEP Interpretation: 0
Total Protein ELP: 11.9 g/dL — ABNORMAL HIGH (ref 6.0–8.5)

## 2018-06-22 ENCOUNTER — Encounter (HOSPITAL_COMMUNITY): Payer: Self-pay | Admitting: *Deleted

## 2018-06-22 ENCOUNTER — Inpatient Hospital Stay: Payer: BLUE CROSS/BLUE SHIELD

## 2018-06-22 ENCOUNTER — Other Ambulatory Visit: Payer: Self-pay

## 2018-06-22 ENCOUNTER — Other Ambulatory Visit: Payer: Self-pay | Admitting: *Deleted

## 2018-06-22 ENCOUNTER — Other Ambulatory Visit: Payer: Self-pay | Admitting: Hematology & Oncology

## 2018-06-22 ENCOUNTER — Inpatient Hospital Stay (HOSPITAL_COMMUNITY)
Admission: AD | Admit: 2018-06-22 | Discharge: 2018-07-09 | DRG: 840 | Disposition: E | Payer: BLUE CROSS/BLUE SHIELD | Source: Ambulatory Visit | Attending: Hematology & Oncology | Admitting: Hematology & Oncology

## 2018-06-22 VITALS — BP 142/62 | HR 98 | Temp 97.7°F | Resp 18

## 2018-06-22 DIAGNOSIS — J690 Pneumonitis due to inhalation of food and vomit: Secondary | ICD-10-CM

## 2018-06-22 DIAGNOSIS — I10 Essential (primary) hypertension: Secondary | ICD-10-CM | POA: Diagnosis present

## 2018-06-22 DIAGNOSIS — R6521 Severe sepsis with septic shock: Secondary | ICD-10-CM | POA: Diagnosis not present

## 2018-06-22 DIAGNOSIS — A4151 Sepsis due to Escherichia coli [E. coli]: Secondary | ICD-10-CM | POA: Diagnosis not present

## 2018-06-22 DIAGNOSIS — C9002 Multiple myeloma in relapse: Secondary | ICD-10-CM | POA: Diagnosis not present

## 2018-06-22 DIAGNOSIS — M899 Disorder of bone, unspecified: Secondary | ICD-10-CM

## 2018-06-22 DIAGNOSIS — M353 Polymyalgia rheumatica: Secondary | ICD-10-CM | POA: Diagnosis present

## 2018-06-22 DIAGNOSIS — K589 Irritable bowel syndrome without diarrhea: Secondary | ICD-10-CM | POA: Diagnosis present

## 2018-06-22 DIAGNOSIS — Q999 Chromosomal abnormality, unspecified: Secondary | ICD-10-CM | POA: Diagnosis not present

## 2018-06-22 DIAGNOSIS — I361 Nonrheumatic tricuspid (valve) insufficiency: Secondary | ICD-10-CM | POA: Diagnosis not present

## 2018-06-22 DIAGNOSIS — Z95828 Presence of other vascular implants and grafts: Secondary | ICD-10-CM | POA: Diagnosis not present

## 2018-06-22 DIAGNOSIS — Z888 Allergy status to other drugs, medicaments and biological substances status: Secondary | ICD-10-CM

## 2018-06-22 DIAGNOSIS — Z87891 Personal history of nicotine dependence: Secondary | ICD-10-CM

## 2018-06-22 DIAGNOSIS — Z885 Allergy status to narcotic agent status: Secondary | ICD-10-CM

## 2018-06-22 DIAGNOSIS — G9341 Metabolic encephalopathy: Secondary | ICD-10-CM | POA: Diagnosis not present

## 2018-06-22 DIAGNOSIS — C9000 Multiple myeloma not having achieved remission: Secondary | ICD-10-CM

## 2018-06-22 DIAGNOSIS — R001 Bradycardia, unspecified: Secondary | ICD-10-CM | POA: Diagnosis not present

## 2018-06-22 DIAGNOSIS — D61818 Other pancytopenia: Secondary | ICD-10-CM | POA: Diagnosis not present

## 2018-06-22 DIAGNOSIS — Z978 Presence of other specified devices: Secondary | ICD-10-CM

## 2018-06-22 DIAGNOSIS — K219 Gastro-esophageal reflux disease without esophagitis: Secondary | ICD-10-CM | POA: Diagnosis present

## 2018-06-22 DIAGNOSIS — R350 Frequency of micturition: Secondary | ICD-10-CM | POA: Diagnosis present

## 2018-06-22 DIAGNOSIS — E872 Acidosis, unspecified: Secondary | ICD-10-CM

## 2018-06-22 DIAGNOSIS — Z8249 Family history of ischemic heart disease and other diseases of the circulatory system: Secondary | ICD-10-CM

## 2018-06-22 DIAGNOSIS — N179 Acute kidney failure, unspecified: Secondary | ICD-10-CM

## 2018-06-22 DIAGNOSIS — Z79891 Long term (current) use of opiate analgesic: Secondary | ICD-10-CM

## 2018-06-22 DIAGNOSIS — R29722 NIHSS score 22: Secondary | ICD-10-CM | POA: Diagnosis not present

## 2018-06-22 DIAGNOSIS — R5081 Fever presenting with conditions classified elsewhere: Secondary | ICD-10-CM | POA: Diagnosis not present

## 2018-06-22 DIAGNOSIS — J9601 Acute respiratory failure with hypoxia: Secondary | ICD-10-CM | POA: Diagnosis not present

## 2018-06-22 DIAGNOSIS — A419 Sepsis, unspecified organism: Secondary | ICD-10-CM | POA: Diagnosis not present

## 2018-06-22 DIAGNOSIS — D649 Anemia, unspecified: Secondary | ICD-10-CM

## 2018-06-22 DIAGNOSIS — Z79899 Other long term (current) drug therapy: Secondary | ICD-10-CM

## 2018-06-22 DIAGNOSIS — F329 Major depressive disorder, single episode, unspecified: Secondary | ICD-10-CM

## 2018-06-22 DIAGNOSIS — R011 Cardiac murmur, unspecified: Secondary | ICD-10-CM | POA: Diagnosis present

## 2018-06-22 DIAGNOSIS — Z9221 Personal history of antineoplastic chemotherapy: Secondary | ICD-10-CM | POA: Diagnosis not present

## 2018-06-22 DIAGNOSIS — R0689 Other abnormalities of breathing: Secondary | ICD-10-CM

## 2018-06-22 LAB — PREPARE RBC (CROSSMATCH)

## 2018-06-22 LAB — COMPREHENSIVE METABOLIC PANEL
ALT: 14 U/L (ref 0–44)
AST: 50 U/L — ABNORMAL HIGH (ref 15–41)
Albumin: 3 g/dL — ABNORMAL LOW (ref 3.5–5.0)
Alkaline Phosphatase: 28 U/L — ABNORMAL LOW (ref 38–126)
Anion gap: 3 — ABNORMAL LOW (ref 5–15)
BUN: 14 mg/dL (ref 8–23)
CO2: 22 mmol/L (ref 22–32)
Calcium: 11.2 mg/dL — ABNORMAL HIGH (ref 8.9–10.3)
Chloride: 107 mmol/L (ref 98–111)
Creatinine, Ser: 1.13 mg/dL — ABNORMAL HIGH (ref 0.44–1.00)
GFR calc Af Amer: 60 mL/min — ABNORMAL LOW (ref >60)
GFR calc non Af Amer: 52 mL/min — ABNORMAL LOW (ref >60)
Glucose, Bld: 96 mg/dL (ref 70–99)
Potassium: 3.9 mmol/L (ref 3.5–5.1)
Sodium: 132 mmol/L — ABNORMAL LOW (ref 135–145)
Total Bilirubin: 0.4 mg/dL (ref 0.3–1.2)
Total Protein: 11.5 g/dL — ABNORMAL HIGH (ref 6.5–8.1)

## 2018-06-22 MED ORDER — HYDROCODONE-ACETAMINOPHEN 5-325 MG PO TABS
1.0000 | ORAL_TABLET | ORAL | Status: DC | PRN
  Administered 2018-06-23 – 2018-06-25 (×3): 1 via ORAL
  Filled 2018-06-22 (×3): qty 1

## 2018-06-22 MED ORDER — DIPHENHYDRAMINE HCL 25 MG PO CAPS
ORAL_CAPSULE | ORAL | Status: AC
  Filled 2018-06-22: qty 1

## 2018-06-22 MED ORDER — ENOXAPARIN SODIUM 40 MG/0.4ML ~~LOC~~ SOLN
40.0000 mg | SUBCUTANEOUS | Status: DC
  Administered 2018-06-24 – 2018-06-26 (×3): 40 mg via SUBCUTANEOUS
  Filled 2018-06-22 (×4): qty 0.4

## 2018-06-22 MED ORDER — SODIUM CHLORIDE 0.9% FLUSH
10.0000 mL | INTRAVENOUS | Status: AC | PRN
  Administered 2018-06-22: 10 mL
  Filled 2018-06-22: qty 10

## 2018-06-22 MED ORDER — ACETAMINOPHEN 325 MG PO TABS
650.0000 mg | ORAL_TABLET | Freq: Once | ORAL | Status: AC
  Administered 2018-06-22: 650 mg via ORAL

## 2018-06-22 MED ORDER — HEPARIN SOD (PORK) LOCK FLUSH 100 UNIT/ML IV SOLN
500.0000 [IU] | Freq: Every day | INTRAVENOUS | Status: AC | PRN
  Administered 2018-06-22: 500 [IU]
  Filled 2018-06-22: qty 5

## 2018-06-22 MED ORDER — ACETAMINOPHEN 325 MG PO TABS
ORAL_TABLET | ORAL | Status: AC
  Filled 2018-06-22: qty 2

## 2018-06-22 MED ORDER — DIPHENHYDRAMINE HCL 25 MG PO CAPS
25.0000 mg | ORAL_CAPSULE | Freq: Once | ORAL | Status: AC
  Administered 2018-06-22: 25 mg via ORAL

## 2018-06-22 MED ORDER — PROCHLORPERAZINE MALEATE 10 MG PO TABS: 10.0000 mg | ORAL_TABLET | Freq: Four times a day (QID) | ORAL | Status: DC | PRN

## 2018-06-22 MED ORDER — ZOLEDRONIC ACID 4 MG/100ML IV SOLN
4.0000 mg | Freq: Once | INTRAVENOUS | Status: AC
  Administered 2018-06-22: 4 mg via INTRAVENOUS
  Filled 2018-06-22: qty 100

## 2018-06-22 MED ORDER — PANTOPRAZOLE SODIUM 40 MG PO TBEC
40.0000 mg | DELAYED_RELEASE_TABLET | Freq: Every day | ORAL | Status: DC
  Administered 2018-06-22 – 2018-06-26 (×5): 40 mg via ORAL
  Filled 2018-06-22 (×6): qty 1

## 2018-06-22 MED ORDER — SODIUM CHLORIDE 0.9% IV SOLUTION: Freq: Once | INTRAVENOUS | Status: DC

## 2018-06-22 MED ORDER — SODIUM CHLORIDE 0.9% IV SOLUTION
250.0000 mL | Freq: Once | INTRAVENOUS | Status: AC
  Administered 2018-06-22: 250 mL via INTRAVENOUS
  Filled 2018-06-22: qty 250

## 2018-06-22 MED ORDER — FUROSEMIDE 10 MG/ML IJ SOLN: 20.0000 mg | Freq: Once | INTRAMUSCULAR | Status: DC

## 2018-06-22 MED ORDER — POTASSIUM CHLORIDE IN NACL 20-0.9 MEQ/L-% IV SOLN
INTRAVENOUS | Status: DC
  Administered 2018-06-22: 22:00:00 via INTRAVENOUS
  Filled 2018-06-22 (×2): qty 1000

## 2018-06-22 NOTE — H&P (Signed)
Referral MD  Reason for Referral: Refractory myeloma; hypercalcemia  No chief complaint on file. : I am getting weaker.  HPI: Ms. currently is well-known to me.  She is a 63 year old African-American female.  She has IgG kappa myeloma.  She has been on various treatment protocols for the past 4 or 5 years.  She is now getting to the point where she is becoming refractory to therapy.  She has failed numerous frontline therapies.  She has failed monoclonal antibody therapy.  She had a recent bone marrow biopsy which showed 50% plasma cell infiltration.  She had multiple chromosomal abnormalities.  We have arranged for her to go down to [place] to be considered for a clinical trial with CAR-T therapy.  She unfortunately is rapidly progressing with her myeloma.  Her M spike is now 5.7 g/dL.  Her IgG level is over 8000 mg/dL.  Her kappa light chain was 18.1 mg/dL.  She has had more weakness.  She is tired.  She sleeps a lot.  She was getting blood in the office today.  I felt that we were in a situation where she needed to be treated urgently with aggressive IV chemotherapy.  She and her husband are in agreement.  She has now been admitted for VD-PACE treatment.  She has had a lot of problems with past therapy.  She is had multiple delays because of irritable bowel syndrome.  She has had urinary problems.  She is worried about some leg swelling.  We are not admitting her for treatment.  She understands that this protocol is very aggressive and that she will lose her hair.  She understands that this will cause some transient weakness and fatigue before she will hopefully improve.  Overall, her performance status is ECOG 2.   Past Medical History:  Diagnosis Date  . Anemia   . Arthritis   . Arthropathy, unspecified, site unspecified   . Complication of anesthesia    02/13/14- had biospy in X-Ray- "twilight"  "I felt every thing"  . Constipation   . Endometriosis   . Family history  of adverse reaction to anesthesia    Sister- patient will find out  . Fibroid   . GERD (gastroesophageal reflux disease)   . Heart murmur   . IBS (irritable bowel syndrome)   . Iron deficiency anemia due to chronic blood loss 08/15/2017  . Malabsorption of iron 08/15/2017  . MGUS (monoclonal gammopathy of unknown significance) 11/02/2011  . Multiple myeloma ([place]) 02/18/2014  . Myelitis ([place]) 2011   of bone- neck  . Neuromuscular disorder ([place])   . Polymyalgia rheumatica ([place])   . PONV (postoperative nausea and vomiting)    1990  :  Past Surgical History:  Procedure Laterality Date  . COLONOSCOPY    . Fistula repair surg    . IR GENERIC HISTORICAL  04/14/2016   IR FLUORO GUIDE PORT INSERTION RIGHT 04/14/2016 Greggory Keen, MD [facility]  . IR GENERIC HISTORICAL  04/14/2016   IR US GUIDE VASC ACCESS RIGHT 04/14/2016 Greggory Keen, MD [facility]  . KNEE ARTHROSCOPY Left   . NECK SURGERY  2011   Myletis- bone grafting- from her left hip  . RADIOLOGY WITH ANESTHESIA N/A 08/23/2014   Procedure: T12  ABLATION       (RADIOLOGY WITH ANESTHESIA);  Surgeon: Luanne Bras, MD;  Location: [facility];  Service: Radiology;  Laterality: N/A;  . TUBAL LIGATION    . UMBILICAL HERNIA REPAIR     age 67  :  Current Facility-Administered Medications:  .  0.9 %  sodium chloride infusion (Manually program via Guardrails IV Fluids), , Intravenous, Once, ,  R, MD .  0.9 % NaCl with KCl 20 mEq/ L  infusion, , Intravenous, Continuous, , Rudell Cobb, MD .  enoxaparin (LOVENOX) injection 40 mg, 40 mg, Subcutaneous, Q24H, , Rudell Cobb, MD .  HYDROcodone-acetaminophen (NORCO/VICODIN) 5-325 MG per tablet 1 tablet, 1 tablet, Oral, Q4H PRN, [name], MD .  pantoprazole (PROTONIX) EC tablet 40 mg, 40 mg, Oral, Daily, , Rudell Cobb, MD .  prochlorperazine (COMPAZINE) tablet 10 mg, 10 mg, Oral, Q6H PRN, [name], MD  Facility-Administered Medications Ordered in Other  Encounters:  .  sodium chloride flush (NS) 0.9 % injection 10 mL, 10 mL, Intravenous, PRN, [place], Sarah M, NP, 10 mL at 04/10/18 0957:  . sodium chloride   Intravenous Once  . enoxaparin (LOVENOX) injection  40 mg Subcutaneous Q24H  . pantoprazole  40 mg Oral Daily  :  Allergies  Allergen Reactions  . Barium-Containing Compounds Diarrhea    Oral Redicat gives pt severe diarrhea, causing delay in scan time, see previous ct scan notes for details  . Codeine Palpitations  :  Family History  Problem Relation Age of Onset  . Heart disease Mother   . Rheum arthritis Mother   . Lung cancer Maternal Uncle   . Throat cancer Maternal Aunt   . Diabetes Maternal Grandmother   . Hypertension Maternal Grandmother   . Heart disease Maternal Grandmother   . Heart disease Paternal Grandmother   . Colon cancer Neg Hx   . Esophageal cancer Neg Hx   . Stomach cancer Neg Hx   . Rectal cancer Neg Hx   :  Social History   Socioeconomic History  . Marital status: Married    Spouse name: [name]  . Number of children: 1  . Years of education: 10  . Highest education level: Not on file  Occupational History  . Occupation: Metallurgist: Civil engineer, contracting  Social Needs  . Financial resource strain: Not on file  . Food insecurity:    Worry: Not on file    Inability: Not on file  . Transportation needs:    Medical: Not on file    Non-medical: Not on file  Tobacco Use  . Smoking status: Former Smoker    Packs/day: 25.00    Years: 5.00    Pack years: 125.00    Types: Cigarettes    Start date: 05/26/1988    Last attempt to quit: 01/11/1994    Years since quitting: 24.4  . Smokeless tobacco: Never Used  . Tobacco comment: QUIT SMOKING 20 YEARS AGO  Substance and Sexual Activity  . Alcohol use: No    Alcohol/week: 0.0 standard drinks  . Drug use: No  . Sexual activity: Not Currently    Comment: BTL  Lifestyle  . Physical activity:    Days per week: 1 day    Minutes  per session: 30 min  . Stress: Only a little  Relationships  . Social connections:    Talks on phone: Patient refused    Gets together: Patient refused    Attends religious service: Patient refused    Active member of club or organization: Patient refused    Attends meetings of clubs or organizations: Patient refused    Relationship status: Patient refused  . Intimate partner violence:    Fear of current or ex partner: No  Emotionally abused: No    Physically abused: No    Forced sexual activity: No  Other Topics Concern  . Not on file  Social History Narrative   Patient lives at home with her husband [name]). Patient works full time.   Education- High school   Right handed.   Caffeine- None  :  Review of Systems  Constitutional: Positive for malaise/fatigue and weight loss.  HENT: Negative.   Eyes: Positive for blurred vision.  Respiratory: Positive for cough and shortness of breath.   Cardiovascular: Positive for palpitations and leg swelling.  Gastrointestinal: Positive for abdominal pain, heartburn and vomiting.  Genitourinary: Positive for dysuria, frequency and urgency.  Musculoskeletal: Positive for back pain, joint pain, myalgias and neck pain.  Skin: Negative.   Neurological: Positive for tingling and weakness.  Psychiatric/Behavioral: Positive for depression.     Exam: Somewhat ill-appearing African-American female in no obvious distress.  Vital signs show temperature of 97.6.  Pulse 98.  Blood pressure 152/85.  Weight is 162 pounds.  Head neck exam shows no ocular or oral lesions.  There is no scleral icterus.  She has no adenopathy in the neck.  Lungs are clear bilaterally.  Cardiac exam regular rate and rhythm with no murmurs, rubs or bruits.  Abdomen is soft.  She has decreased bowel sounds.  There is no fluid wave.  There is no palpable abdominal mass.  There is no liver or spleen tip.  Extremities shows chronic 1+ edema in her legs.  Neurological exam shows  some lethargy.  Skin exam shows no rashes, ecchymoses or petechia. Patient Vitals for the past 24 hrs:  BP Temp Temp src Pulse Resp SpO2  06/20/2018 1550 (!) 152/85 97.6 F (36.4 C) Oral 98 18 99 %     No results for input(s): WBC, HGB, HCT, PLT in the last 72 hours. Recent Labs    06/21/2018 1206  NA 132*  K 3.9  CL 107  CO2 22  GLUCOSE 96  BUN 14  CREATININE 1.13*  CALCIUM 11.2*    Blood smear review: None  Pathology: None    Assessment and Plan: Ms. Chrystie Nose is a 63 year old African-American female.  She has multiple myeloma.  I think her disease is refractory.  She has had 5 or 6 different lines of therapy.  We are trying to improve her status so that she can go down to [place] for CAR-T therapy which I think would be optimal for her.  She has a Port-A-Cath already which is a blessing.  In our office today she got 1 unit of blood.  She received a dose of Zometa.  We will hopefully be able to start her treatment tomorrow.  She really needs to get started with therapy so we can decrease her myeloma burden.  I think that she probably has an element of hyperviscosity that is causing her lethargy and fatigue.  Her husband and I talked in private.  He understands fully that we do not have many options left at all.  We have to pursue aggressive chemotherapy now in order to try to decrease her myeloma burden so that we can get her down to [place].  I talked to Ms. Fadeley and her husband about the side effects.  She will clearly need Neupogen/Neulasta.  She will need antibiotics.  We will see what her labs are daily.  Hopefully, she will be feeling a little bit better tomorrow after her transfusion and Zometa.  I know that the staff up  on [address] will do a fantastic job helping her and her husband.  Her husband is having surgery on Friday for his gallbladder.   Lattie Haw, MD

## 2018-06-22 NOTE — Patient Instructions (Signed)

## 2018-06-22 NOTE — Progress Notes (Signed)
Patient discharged to hospital per Dr. Marin Olp. Patient dc via wheelchair with husband.

## 2018-06-23 ENCOUNTER — Inpatient Hospital Stay (HOSPITAL_COMMUNITY): Payer: BLUE CROSS/BLUE SHIELD

## 2018-06-23 DIAGNOSIS — I361 Nonrheumatic tricuspid (valve) insufficiency: Secondary | ICD-10-CM

## 2018-06-23 LAB — BPAM RBC
Blood Product Expiration Date: 202003062359
Blood Product Expiration Date: 202003062359
Blood Product Expiration Date: 202003092359
ISSUE DATE / TIME: 202002130723
ISSUE DATE / TIME: 202002131819
UNIT TYPE AND RH: 6200
Unit Type and Rh: 6200
Unit Type and Rh: 6200

## 2018-06-23 LAB — CBC
HEMATOCRIT: 30.1 % — AB (ref 36.0–46.0)
HEMOGLOBIN: 9.6 g/dL — AB (ref 12.0–15.0)
MCH: 29.6 pg (ref 26.0–34.0)
MCHC: 31.9 g/dL (ref 30.0–36.0)
MCV: 92.9 fL (ref 80.0–100.0)
Platelets: 79 10*3/uL — ABNORMAL LOW (ref 150–400)
RBC: 3.24 MIL/uL — ABNORMAL LOW (ref 3.87–5.11)
RDW: 16.4 % — ABNORMAL HIGH (ref 11.5–15.5)
WBC: 3.6 10*3/uL — ABNORMAL LOW (ref 4.0–10.5)
nRBC: 0 % (ref 0.0–0.2)

## 2018-06-23 LAB — COMPREHENSIVE METABOLIC PANEL
ALBUMIN: 2.6 g/dL — AB (ref 3.5–5.0)
ALK PHOS: 31 U/L — AB (ref 38–126)
ALT: 21 U/L (ref 0–44)
AST: 60 U/L — ABNORMAL HIGH (ref 15–41)
Anion gap: 4 — ABNORMAL LOW (ref 5–15)
BILIRUBIN TOTAL: 0.9 mg/dL (ref 0.3–1.2)
BUN: 13 mg/dL (ref 8–23)
CO2: 19 mmol/L — ABNORMAL LOW (ref 22–32)
Calcium: 10.6 mg/dL — ABNORMAL HIGH (ref 8.9–10.3)
Chloride: 112 mmol/L — ABNORMAL HIGH (ref 98–111)
Creatinine, Ser: 1.16 mg/dL — ABNORMAL HIGH (ref 0.44–1.00)
GFR calc Af Amer: 58 mL/min — ABNORMAL LOW (ref >60)
GFR calc non Af Amer: 50 mL/min — ABNORMAL LOW (ref >60)
Glucose, Bld: 73 mg/dL (ref 70–99)
Potassium: 4.1 mmol/L (ref 3.5–5.1)
Sodium: 135 mmol/L (ref 135–145)
Total Protein: 11.7 g/dL — ABNORMAL HIGH (ref 6.5–8.1)

## 2018-06-23 LAB — TYPE AND SCREEN
ABO/RH(D): A POS
Antibody Screen: NEGATIVE
Unit division: 0
Unit division: 0
Unit division: 0

## 2018-06-23 LAB — ECHOCARDIOGRAM COMPLETE

## 2018-06-23 MED ORDER — POTASSIUM CHLORIDE 2 MEQ/ML IV SOLN
Freq: Once | INTRAVENOUS | Status: AC
  Administered 2018-06-25: 17:00:00 via INTRAVENOUS
  Filled 2018-06-23: qty 10

## 2018-06-23 MED ORDER — HEPARIN SOD (PORK) LOCK FLUSH 100 UNIT/ML IV SOLN
500.0000 [IU] | Freq: Once | INTRAVENOUS | Status: DC
  Filled 2018-06-23: qty 5

## 2018-06-23 MED ORDER — SODIUM CHLORIDE 0.9 % IV SOLN
Freq: Once | INTRAVENOUS | Status: AC
  Administered 2018-06-23: 16 mg via INTRAVENOUS
  Filled 2018-06-23: qty 16

## 2018-06-23 MED ORDER — DEXAMETHASONE 4 MG PO TABS
40.0000 mg | ORAL_TABLET | Freq: Once | ORAL | Status: AC
  Administered 2018-06-23: 40 mg via ORAL
  Filled 2018-06-23: qty 10

## 2018-06-23 MED ORDER — BIOTENE DRY MOUTH MT LIQD
15.0000 mL | OROMUCOSAL | Status: DC
  Administered 2018-06-23 – 2018-06-26 (×12): 15 mL via OROMUCOSAL

## 2018-06-23 MED ORDER — POTASSIUM CHLORIDE 2 MEQ/ML IV SOLN
Freq: Once | INTRAVENOUS | Status: AC
  Administered 2018-06-23: 15:00:00 via INTRAVENOUS
  Filled 2018-06-23: qty 10

## 2018-06-23 MED ORDER — DEXAMETHASONE 4 MG PO TABS
40.0000 mg | ORAL_TABLET | Freq: Once | ORAL | Status: AC
  Administered 2018-06-25: 40 mg via ORAL
  Filled 2018-06-23: qty 10

## 2018-06-23 MED ORDER — PALONOSETRON HCL INJECTION 0.25 MG/5ML
0.2500 mg | Freq: Once | INTRAVENOUS | Status: AC
  Administered 2018-06-23: 0.25 mg via INTRAVENOUS
  Filled 2018-06-23: qty 5

## 2018-06-23 MED ORDER — SODIUM CHLORIDE 0.9 % IV SOLN
9.0000 mg/m2 | Freq: Once | INTRAVENOUS | Status: AC
  Administered 2018-06-24: 16 mg via INTRAVENOUS
  Filled 2018-06-23: qty 8

## 2018-06-23 MED ORDER — ALTEPLASE 2 MG IJ SOLR
2.0000 mg | Freq: Once | INTRAMUSCULAR | Status: DC | PRN
  Filled 2018-06-23: qty 2

## 2018-06-23 MED ORDER — SODIUM CHLORIDE 0.9% FLUSH: 3.0000 mL | INTRAVENOUS | Status: DC | PRN

## 2018-06-23 MED ORDER — SODIUM CHLORIDE 0.9% FLUSH: 10.0000 mL | INTRAVENOUS | Status: DC | PRN

## 2018-06-23 MED ORDER — SODIUM CHLORIDE 0.9 % IV SOLN
Freq: Once | INTRAVENOUS | Status: AC
  Administered 2018-06-23: 16:00:00 via INTRAVENOUS
  Filled 2018-06-23: qty 5

## 2018-06-23 MED ORDER — DEXAMETHASONE 4 MG PO TABS
40.0000 mg | ORAL_TABLET | Freq: Once | ORAL | Status: AC
  Administered 2018-06-24: 40 mg via ORAL
  Filled 2018-06-23: qty 10

## 2018-06-23 MED ORDER — ACYCLOVIR 200 MG PO CAPS
400.0000 mg | ORAL_CAPSULE | Freq: Two times a day (BID) | ORAL | Status: DC
  Administered 2018-06-23 – 2018-06-26 (×7): 400 mg via ORAL
  Filled 2018-06-23 (×9): qty 2

## 2018-06-23 MED ORDER — SODIUM CHLORIDE 0.9 % IV SOLN
Freq: Once | INTRAVENOUS | Status: AC
  Administered 2018-06-24: 742 mg via INTRAVENOUS
  Filled 2018-06-23: qty 16

## 2018-06-23 MED ORDER — SODIUM BICARBONATE/SODIUM CHLORIDE MOUTHWASH
OROMUCOSAL | Status: DC
  Administered 2018-06-23 – 2018-06-24 (×5): via OROMUCOSAL
  Administered 2018-06-24: 1 via OROMUCOSAL
  Administered 2018-06-24 – 2018-06-26 (×12): via OROMUCOSAL
  Filled 2018-06-23: qty 1000

## 2018-06-23 MED ORDER — SODIUM CHLORIDE 0.9 % IV SOLN
9.0000 mg/m2 | Freq: Once | INTRAVENOUS | Status: AC
  Administered 2018-06-25: 16 mg via INTRAVENOUS
  Filled 2018-06-23: qty 8

## 2018-06-23 MED ORDER — POTASSIUM CHLORIDE 2 MEQ/ML IV SOLN
Freq: Once | INTRAVENOUS | Status: AC
  Administered 2018-06-24: 17:00:00 via INTRAVENOUS
  Filled 2018-06-23: qty 10

## 2018-06-23 MED ORDER — LIP MEDEX EX OINT
TOPICAL_OINTMENT | CUTANEOUS | Status: AC
  Administered 2018-06-23: 20:00:00
  Filled 2018-06-23: qty 7

## 2018-06-23 MED ORDER — HEPARIN SOD (PORK) LOCK FLUSH 100 UNIT/ML IV SOLN
250.0000 [IU] | Freq: Once | INTRAVENOUS | Status: DC | PRN
  Filled 2018-06-23: qty 2.5

## 2018-06-23 MED ORDER — HEPARIN SOD (PORK) LOCK FLUSH 100 UNIT/ML IV SOLN
500.0000 [IU] | Freq: Once | INTRAVENOUS | Status: DC | PRN
  Filled 2018-06-23: qty 5

## 2018-06-23 MED ORDER — HOT PACK MISC ONCOLOGY
1.0000 | Freq: Once | Status: DC | PRN
  Filled 2018-06-23: qty 1

## 2018-06-23 MED ORDER — SODIUM CHLORIDE 0.9 % IV SOLN: INTRAVENOUS | Status: DC

## 2018-06-23 MED ORDER — PALONOSETRON HCL INJECTION 0.25 MG/5ML
0.2500 mg | Freq: Once | INTRAVENOUS | Status: AC
  Administered 2018-06-25: 0.25 mg via INTRAVENOUS
  Filled 2018-06-23: qty 5

## 2018-06-23 MED ORDER — SODIUM CHLORIDE 0.9 % IV SOLN
9.0000 mg/m2 | Freq: Once | INTRAVENOUS | Status: AC
  Administered 2018-06-23: 16 mg via INTRAVENOUS
  Filled 2018-06-23: qty 8

## 2018-06-23 MED ORDER — SODIUM CHLORIDE 0.9 % IV SOLN
Freq: Once | INTRAVENOUS | Status: AC
  Administered 2018-06-25: 16 mg via INTRAVENOUS
  Filled 2018-06-23: qty 16

## 2018-06-23 MED ORDER — COLD PACK MISC ONCOLOGY
1.0000 | Freq: Once | Status: DC | PRN
  Filled 2018-06-23: qty 1

## 2018-06-23 MED ORDER — BORTEZOMIB CHEMO IV INJECTION 3.5 MG(1MG/ML)
1.3000 mg/m2 | Freq: Once | INTRAMUSCULAR | Status: AC
  Administered 2018-06-23: 2.4 mg via INTRAVENOUS
  Filled 2018-06-23: qty 2.4

## 2018-06-23 NOTE — Progress Notes (Signed)
Barbara Cook looks a bit better.  I think the blood transfusion definitely helped her.  She also had her hypercalcemia treated.  This morning, her calcium is down to 10.6.  She said that her husband is not going to have gallbladder surgery today.  She said that they are worried about his heart.  I feel bad about that.  She is ready for chemotherapy.  She will start VD-PACE today.  My think we have to get started today.  Her myeloma is rapidly progressing.  Hopefully, she will have more of an appetite today.  I would think that the Decadron that we will give her will help this.  Her labs today show white cell count 3.6.  Hemoglobin 9.6.  Platelet count 79,000.  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  Her sodium is 135.  Potassium 4.1.  BUN 13 creatinine 1.16.  Calcium is 10.4.  Albumin is 2.6.  Her total protein is 11.7.  She is not having diarrhea.  She is not having any bleeding.  There is no rashes.  She is having no cough.  With her physical exam, her temperature is 98.1.  Pulse 96.  Blood pressure 133/70.  Head neck exam shows no ocular or oral lesions.  She has no scleral icterus.  She has no intraoral lesions.  She has no adenopathy in the neck.  Lungs are clear bilaterally.  Cardiac exam regular rate and rhythm with no murmurs, rubs or bruits.  Abdomen is soft.  She has good bowel sounds.  There is no fluid wave.  There is no palpable liver or spleen tip.  Extremities shows mild pitting edema in her lower legs.  Skin exam shows no rashes, ecchymoses or petechia.  Neurological exam shows no focal deficits.  Barbara Cook has rapidly progressive IgG kappa myeloma.  She has had 5 or 6 different lines of therapy.  She now is admitted for VD-PACE.  She needs to start today.  I realize that she is not had an echocardiogram since 2015.  I will get one done today.  However, she MUST start treatment today.  I  appreciate the outstanding care that she is getting from the staff up on [address]  I know that the compassion and caring of the [facility] [address] staff is unreadable.  Lattie Haw, MD  1 Corinthians 13:4

## 2018-06-23 NOTE — Progress Notes (Signed)
Spoke with Dr. Antonieta Pert nurse Charlsie Merles.  Nurses concerned with patients amount of urine output especially with her receiving Cisplatin.  Verbal from Dr. Marin Olp to proceed with treatment as ordered.

## 2018-06-23 NOTE — Progress Notes (Signed)
Calculated chemotherapy doses based on BSA and normal dosing.  Aloha Gell, RN completed independent calculations to verify as well.

## 2018-06-24 DIAGNOSIS — Z9221 Personal history of antineoplastic chemotherapy: Secondary | ICD-10-CM

## 2018-06-24 LAB — COMPREHENSIVE METABOLIC PANEL
ALK PHOS: 27 U/L — AB (ref 38–126)
ALT: 45 U/L — AB (ref 0–44)
AST: 81 U/L — AB (ref 15–41)
Albumin: 2.3 g/dL — ABNORMAL LOW (ref 3.5–5.0)
Anion gap: 3 — ABNORMAL LOW (ref 5–15)
BUN: 13 mg/dL (ref 8–23)
CO2: 18 mmol/L — ABNORMAL LOW (ref 22–32)
CREATININE: 1.07 mg/dL — AB (ref 0.44–1.00)
Calcium: 9.6 mg/dL (ref 8.9–10.3)
Chloride: 112 mmol/L — ABNORMAL HIGH (ref 98–111)
GFR calc Af Amer: >60 mL/min (ref >60)
GFR calc non Af Amer: 55 mL/min — ABNORMAL LOW (ref >60)
Glucose, Bld: 176 mg/dL — ABNORMAL HIGH (ref 70–99)
Potassium: 4.3 mmol/L (ref 3.5–5.1)
Sodium: 133 mmol/L — ABNORMAL LOW (ref 135–145)
Total Bilirubin: 0.6 mg/dL (ref 0.3–1.2)
Total Protein: 11.3 g/dL — ABNORMAL HIGH (ref 6.5–8.1)

## 2018-06-24 LAB — CBC WITH DIFFERENTIAL/PLATELET
ABS IMMATURE GRANULOCYTES: 0.04 10*3/uL (ref 0.00–0.07)
Basophils Absolute: 0 10*3/uL (ref 0.0–0.1)
Basophils Relative: 0 %
Eosinophils Absolute: 0 10*3/uL (ref 0.0–0.5)
Eosinophils Relative: 0 %
HCT: 32.6 % — ABNORMAL LOW (ref 36.0–46.0)
HEMOGLOBIN: 10 g/dL — AB (ref 12.0–15.0)
Immature Granulocytes: 1 %
LYMPHS ABS: 0.6 10*3/uL — AB (ref 0.7–4.0)
Lymphocytes Relative: 15 %
MCH: 28.5 pg (ref 26.0–34.0)
MCHC: 30.7 g/dL (ref 30.0–36.0)
MCV: 92.9 fL (ref 80.0–100.0)
Monocytes Absolute: 0.2 10*3/uL (ref 0.1–1.0)
Monocytes Relative: 5 %
Neutro Abs: 3 10*3/uL (ref 1.7–7.7)
Neutrophils Relative %: 79 %
Platelets: 77 10*3/uL — ABNORMAL LOW (ref 150–400)
RBC: 3.51 MIL/uL — ABNORMAL LOW (ref 3.87–5.11)
RDW: 16.7 % — ABNORMAL HIGH (ref 11.5–15.5)
WBC: 3.8 10*3/uL — ABNORMAL LOW (ref 4.0–10.5)
nRBC: 0 % (ref 0.0–0.2)

## 2018-06-24 NOTE — Progress Notes (Signed)
Subjective: The patient is seen and examined today.  Her husband was at the bedside.  She is feeling fine today except for fatigue and some confusion.  She denied having any current fever or chills.  She has no nausea, vomiting, diarrhea or constipation.  She tolerated the first day of her treatment with chemotherapy fairly well.  Objective: Vital signs in last 24 hours: Temp:  [97.3 F (36.3 C)-98.3 F (36.8 C)] 97.3 F (36.3 C) (02/15 0718) Pulse Rate:  [81-94] 87 (02/15 0718) Resp:  [16-18] 18 (02/15 0718) BP: (119-154)/(63-94) 154/94 (02/15 0718) SpO2:  [94 %-100 %] 100 % (02/15 0718)  Intake/Output from previous day: 02/14 0701 - 02/15 0700 In: 2384.8 [P.O.:650; I.V.:549.2; IV Piggyback:1185.6] Out: 800 [Urine:800] Intake/Output this shift: Total I/O In: 240 [P.O.:240] Out: 200 [Urine:200]  General appearance: alert, cooperative, fatigued and no distress Resp: clear to auscultation bilaterally Cardio: regular rate and rhythm, S1, S2 normal, no murmur, click, rub or gallop GI: soft, non-tender; bowel sounds normal; no masses,  no organomegaly Extremities: extremities normal, atraumatic, no cyanosis or edema  Lab Results:  Recent Labs    06/23/18 0109 06/24/18 0602  WBC 3.6* 3.8*  HGB 9.6* 10.0*  HCT 30.1* 32.6*  PLT 79* 77*   BMET Recent Labs    06/23/18 0109 06/24/18 0602  NA 135 133*  K 4.1 4.3  CL 112* 112*  CO2 19* 18*  GLUCOSE 73 176*  BUN 13 13  CREATININE 1.16* 1.07*  CALCIUM 10.6* 9.6    Studies/Results: No results found.  Medications: I have reviewed the patient's current medications.  Assessment/Plan: This is a very pleasant 63 years old African-American female with refractory multiple myeloma status post several chemotherapy regimens in the past but she continues to have disease progression. The patient was started yesterday on another systemic chemotherapy regiment with VD-PACE.  She tolerated the first day of her treatment well with no  concerning adverse effects. I recommended for her to continue her treatment today as planned. For the pancytopenia, we will continue to monitor her counts closely. Hypercalcemia is better today. Continue GI prophylaxis with Protonix. For pain management she is currently on Norco.    LOS: 2 days    Eilleen Kempf 06/24/2018

## 2018-06-25 DIAGNOSIS — D61818 Other pancytopenia: Secondary | ICD-10-CM

## 2018-06-25 DIAGNOSIS — I10 Essential (primary) hypertension: Secondary | ICD-10-CM

## 2018-06-25 LAB — COMPREHENSIVE METABOLIC PANEL
ALT: 57 U/L — ABNORMAL HIGH (ref 0–44)
AST: 94 U/L — ABNORMAL HIGH (ref 15–41)
Albumin: 2.3 g/dL — ABNORMAL LOW (ref 3.5–5.0)
Alkaline Phosphatase: 29 U/L — ABNORMAL LOW (ref 38–126)
Anion gap: 2 — ABNORMAL LOW (ref 5–15)
BUN: 22 mg/dL (ref 8–23)
CO2: 16 mmol/L — ABNORMAL LOW (ref 22–32)
Calcium: 8.9 mg/dL (ref 8.9–10.3)
Chloride: 116 mmol/L — ABNORMAL HIGH (ref 98–111)
Creatinine, Ser: 1.23 mg/dL — ABNORMAL HIGH (ref 0.44–1.00)
GFR calc Af Amer: 54 mL/min — ABNORMAL LOW (ref >60)
GFR calc non Af Amer: 47 mL/min — ABNORMAL LOW (ref >60)
Glucose, Bld: 153 mg/dL — ABNORMAL HIGH (ref 70–99)
Potassium: 4 mmol/L (ref 3.5–5.1)
Sodium: 134 mmol/L — ABNORMAL LOW (ref 135–145)
Total Bilirubin: 0.7 mg/dL (ref 0.3–1.2)
Total Protein: 10.6 g/dL — ABNORMAL HIGH (ref 6.5–8.1)

## 2018-06-25 LAB — CBC WITH DIFFERENTIAL/PLATELET
Abs Immature Granulocytes: 0.06 10*3/uL (ref 0.00–0.07)
Basophils Absolute: 0 10*3/uL (ref 0.0–0.1)
Basophils Relative: 0 %
Eosinophils Absolute: 0 10*3/uL (ref 0.0–0.5)
Eosinophils Relative: 0 %
HEMATOCRIT: 31.5 % — AB (ref 36.0–46.0)
HEMOGLOBIN: 9.7 g/dL — AB (ref 12.0–15.0)
Immature Granulocytes: 1 %
Lymphocytes Relative: 5 %
Lymphs Abs: 0.3 10*3/uL — ABNORMAL LOW (ref 0.7–4.0)
MCH: 29 pg (ref 26.0–34.0)
MCHC: 30.8 g/dL (ref 30.0–36.0)
MCV: 94.3 fL (ref 80.0–100.0)
Monocytes Absolute: 0.4 10*3/uL (ref 0.1–1.0)
Monocytes Relative: 6 %
Neutro Abs: 5.8 10*3/uL (ref 1.7–7.7)
Neutrophils Relative %: 88 %
Platelets: 73 10*3/uL — ABNORMAL LOW (ref 150–400)
RBC: 3.34 MIL/uL — ABNORMAL LOW (ref 3.87–5.11)
RDW: 16.7 % — ABNORMAL HIGH (ref 11.5–15.5)
WBC: 6.5 10*3/uL (ref 4.0–10.5)
nRBC: 0 % (ref 0.0–0.2)

## 2018-06-25 NOTE — Progress Notes (Signed)
Subjective: The patient is seen and examined today.  Her husband and daughter were at the bedside.  She continues to complain of fatigue and weakness.  Her husband has a lot of questions about her current condition and the reason she is on treatment for 4 days.  He called yesterday after the morning around to ask about the benefit of her treatment and whether she needs to continue on this treatment or cut it short to only 1 or 2 days.  He is also wondering why she still have confusions at time.  He seemed like he does not have enough understanding of current condition and poor prognosis after failure of several regimen of treatment.  I answered many of his questions to the best of my knowledge of her condition but I also referred him to Dr. Marin Olp to discuss this with him in more details tomorrow.  Generally the patient has no other physical complaints except for increased frequency of urination.  She has no fever or chills.  She has no nausea, vomiting, diarrhea or constipation.  Objective: Vital signs in last 24 hours: Temp:  [97.5 F (36.4 C)-98.2 F (36.8 C)] 97.5 F (36.4 C) (02/15 2314) Pulse Rate:  [70-88] 75 (02/16 0255) Resp:  [16-18] 16 (02/16 0253) BP: (147-156)/(77-95) 155/90 (02/16 0255) SpO2:  [98 %-99 %] 99 % (02/16 0255)  Intake/Output from previous day: 02/15 0701 - 02/16 0700 In: 720 [P.O.:720] Out: 725 [Urine:725] Intake/Output this shift: Total I/O In: -  Out: 200 [Urine:200]  General appearance: alert, distracted and fatigued Resp: clear to auscultation bilaterally Cardio: regular rate and rhythm, S1, S2 normal, no murmur, click, rub or gallop GI: soft, non-tender; bowel sounds normal; no masses,  no organomegaly Extremities: extremities normal, atraumatic, no cyanosis or edema  Lab Results:  Recent Labs    06/24/18 0602 06/25/18 0611  WBC 3.8* 6.5  HGB 10.0* 9.7*  HCT 32.6* 31.5*  PLT 77* 73*   BMET Recent Labs    06/24/18 0602 06/25/18 0611  NA 133*  134*  K 4.3 4.0  CL 112* 116*  CO2 18* 16*  GLUCOSE 176* 153*  BUN 13 22  CREATININE 1.07* 1.23*  CALCIUM 9.6 8.9    Studies/Results: No results found.  Medications: I have reviewed the patient's current medications.   Assessment/Plan: This is a very pleasant 63 years old African-American female with refractory multiple myeloma status post several chemotherapy regimens in the past but she continues to have disease progression. The patient was started yesterday on another systemic chemotherapy regiment with VD-PACE.   She continues to tolerate this treatment well with no concerning adverse effects.  I recommended for her to proceed with day #3 of her treatment as scheduled. Regarding her prognosis and whether to continue this treatment or not, this will be left for the patient and her family to discuss with Dr. Marin Olp tomorrow.  The husband has a lot of questions about her current condition as well as future plans and goals of care.  Hopefully Dr. Marin Olp will address these issues with him tomorrow. For the pancytopenia, we will continue to monitor her counts closely. For hypertension, we will monitor closely and consider the patient for treatment if persisted. Hypercalcemia is better today.  We will continue to monitor. Continue GI prophylaxis with Protonix. For pain management she is currently on Norco.  For the urine frequency, the patient will have bladder scan today and if needed we will consider her for [facility] catheter.  LOS: 3 days  Eilleen Kempf 06/25/2018

## 2018-06-26 ENCOUNTER — Inpatient Hospital Stay (HOSPITAL_COMMUNITY): Payer: BLUE CROSS/BLUE SHIELD

## 2018-06-26 ENCOUNTER — Ambulatory Visit: Payer: BLUE CROSS/BLUE SHIELD

## 2018-06-26 ENCOUNTER — Other Ambulatory Visit: Payer: BLUE CROSS/BLUE SHIELD

## 2018-06-26 LAB — CBC WITH DIFFERENTIAL/PLATELET
Abs Immature Granulocytes: 0.1 10*3/uL — ABNORMAL HIGH (ref 0.00–0.07)
Basophils Absolute: 0 10*3/uL (ref 0.0–0.1)
Basophils Relative: 0 %
Eosinophils Absolute: 0 10*3/uL (ref 0.0–0.5)
Eosinophils Relative: 0 %
HCT: 31.1 % — ABNORMAL LOW (ref 36.0–46.0)
Hemoglobin: 9.5 g/dL — ABNORMAL LOW (ref 12.0–15.0)
IMMATURE GRANULOCYTES: 2 %
Lymphocytes Relative: 2 %
Lymphs Abs: 0.1 10*3/uL — ABNORMAL LOW (ref 0.7–4.0)
MCH: 29.4 pg (ref 26.0–34.0)
MCHC: 30.5 g/dL (ref 30.0–36.0)
MCV: 96.3 fL (ref 80.0–100.0)
MONOS PCT: 4 %
Monocytes Absolute: 0.2 10*3/uL (ref 0.1–1.0)
NEUTROS PCT: 92 %
Neutro Abs: 4.6 10*3/uL (ref 1.7–7.7)
Platelets: 67 10*3/uL — ABNORMAL LOW (ref 150–400)
RBC: 3.23 MIL/uL — ABNORMAL LOW (ref 3.87–5.11)
RDW: 16.6 % — ABNORMAL HIGH (ref 11.5–15.5)
WBC: 5 10*3/uL (ref 4.0–10.5)
nRBC: 0 % (ref 0.0–0.2)

## 2018-06-26 LAB — COMPREHENSIVE METABOLIC PANEL
ALT: 56 U/L — ABNORMAL HIGH (ref 0–44)
AST: 133 U/L — ABNORMAL HIGH (ref 15–41)
Albumin: 2.2 g/dL — ABNORMAL LOW (ref 3.5–5.0)
Alkaline Phosphatase: 28 U/L — ABNORMAL LOW (ref 38–126)
Anion gap: 2 — ABNORMAL LOW (ref 5–15)
BUN: 31 mg/dL — ABNORMAL HIGH (ref 8–23)
CALCIUM: 8.4 mg/dL — AB (ref 8.9–10.3)
CO2: 15 mmol/L — ABNORMAL LOW (ref 22–32)
Chloride: 117 mmol/L — ABNORMAL HIGH (ref 98–111)
Creatinine, Ser: 1.31 mg/dL — ABNORMAL HIGH (ref 0.44–1.00)
GFR calc Af Amer: 50 mL/min — ABNORMAL LOW (ref >60)
GFR calc non Af Amer: 43 mL/min — ABNORMAL LOW (ref >60)
Glucose, Bld: 131 mg/dL — ABNORMAL HIGH (ref 70–99)
Potassium: 4.3 mmol/L (ref 3.5–5.1)
Sodium: 134 mmol/L — ABNORMAL LOW (ref 135–145)
Total Bilirubin: 0.4 mg/dL (ref 0.3–1.2)
Total Protein: 10.1 g/dL — ABNORMAL HIGH (ref 6.5–8.1)

## 2018-06-26 LAB — PREALBUMIN: Prealbumin: 14.9 mg/dL — ABNORMAL LOW (ref 18–38)

## 2018-06-26 MED ORDER — PROCHLORPERAZINE EDISYLATE 10 MG/2ML IJ SOLN: 10.0000 mg | Freq: Three times a day (TID) | INTRAMUSCULAR | Status: DC | PRN

## 2018-06-26 MED ORDER — BORTEZOMIB CHEMO IV INJECTION 3.5 MG(1MG/ML)
1.3000 mg/m2 | Freq: Once | INTRAMUSCULAR | Status: AC
  Administered 2018-06-26: 2.4 mg via INTRAVENOUS
  Filled 2018-06-26: qty 2.4

## 2018-06-26 MED ORDER — SODIUM CHLORIDE 0.9 % IV SOLN
Freq: Once | INTRAVENOUS | Status: DC
  Administered 2018-06-26: 16 mg via INTRAVENOUS
  Filled 2018-06-26: qty 16

## 2018-06-26 MED ORDER — POTASSIUM CHLORIDE 2 MEQ/ML IV SOLN
Freq: Once | INTRAVENOUS | Status: AC
  Administered 2018-06-26: 16:00:00 via INTRAVENOUS
  Filled 2018-06-26: qty 10

## 2018-06-26 MED ORDER — CITALOPRAM HYDROBROMIDE 20 MG PO TABS
20.0000 mg | ORAL_TABLET | Freq: Every day | ORAL | Status: DC
  Administered 2018-06-26: 20 mg via ORAL
  Filled 2018-06-26 (×2): qty 1

## 2018-06-26 MED ORDER — SODIUM CHLORIDE 0.9 % IV SOLN
8.0000 mg | Freq: Three times a day (TID) | INTRAVENOUS | Status: DC | PRN
  Administered 2018-06-26: 8 mg via INTRAVENOUS
  Filled 2018-06-26: qty 4

## 2018-06-26 MED ORDER — DEXAMETHASONE 6 MG PO TABS
40.0000 mg | ORAL_TABLET | Freq: Every day | ORAL | Status: DC
  Administered 2018-06-26: 40 mg via ORAL
  Filled 2018-06-26: qty 10
  Filled 2018-06-26: qty 1

## 2018-06-26 MED ORDER — SODIUM CHLORIDE (PF) 0.9 % IJ SOLN
INTRAMUSCULAR | Status: AC
  Filled 2018-06-26: qty 50

## 2018-06-26 MED ORDER — DRONABINOL 2.5 MG PO CAPS
2.5000 mg | ORAL_CAPSULE | Freq: Two times a day (BID) | ORAL | Status: DC
  Administered 2018-06-26 (×2): 2.5 mg via ORAL
  Filled 2018-06-26 (×2): qty 1

## 2018-06-26 MED ORDER — IOPAMIDOL (ISOVUE-370) INJECTION 76%
INTRAVENOUS | Status: AC
  Filled 2018-06-26: qty 100

## 2018-06-26 MED ORDER — DEXAMETHASONE 4 MG PO TABS
40.0000 mg | ORAL_TABLET | Freq: Once | ORAL | Status: AC
  Administered 2018-06-26: 40 mg via ORAL

## 2018-06-26 MED ORDER — SODIUM CHLORIDE 0.9 % IV SOLN
9.0000 mg/m2 | Freq: Once | INTRAVENOUS | Status: DC
  Administered 2018-06-26: 16 mg via INTRAVENOUS
  Filled 2018-06-26: qty 8

## 2018-06-26 MED ORDER — IOPAMIDOL (ISOVUE-370) INJECTION 76%
100.0000 mL | Freq: Once | INTRAVENOUS | Status: AC | PRN
  Administered 2018-06-26: 100 mL via INTRAVENOUS

## 2018-06-26 NOTE — Progress Notes (Signed)
Ms. Eskridge is having a little bit of a tough time.  She is quite weak.  Her calcium has come down nicely.  Her total protein has also come down slowly but steadily.  I very much appreciate Dr. Worthy Flank help over the weekend.  Her husband is just very anxious over her condition.  We have always had a tough time with Ms. Lindy and her tolerating treatment.  She has often had delays in treatment because she "has not felt well."  We really need to try to push through this as much as possible.  I know that the VD-PACE will work.  It is a matter of how well she is going to tolerate this.  She had echocardiogram done on Friday.  She has a left ventricular ejection fraction of 60-65%.  Her valves look okay.  I will see about giving her some Marinol to help with her appetite.  She is not eating much.  I will see about doing physical therapy for her.  She is not bleeding.  She is having no vomiting.  There is no diarrhea.  On her vital signs, her temperature is 97.9.  Pulse 72.  Blood pressure is 163/91.  Her oral exam shows no mucositis.  There is no thrush.  She has no adenopathy in the neck.  Lungs are clear bilaterally.  Cardiac exam regular rate and rhythm with no murmurs rubs or bruits.  Abdomen is soft.  She has good bowel sounds.  There is no fluid wave.  There is no palpable liver or spleen tip.  Extremities shows some slight edema in her legs bilaterally.  Neurological exam shows some overall lethargy.   Her labs show a white cell count of 5 hemoglobin 9.5 platelet count 67,000.  Sodium 134.  Potassium 4.3.  Chloride 117.  BUN 31 creatinine 1.3.  Calcium 8.4 with an albumin of 2.2.  Her liver function studies are up a little bit.  Hopefully, Ms. Fesler will improve.  I am not totally confident that she will tolerate more chemotherapy.  I know that chemotherapy will work.  Her total protein is already come down.  However, she just has a very weak constitution and just cannot deal with  stress.  Hopefully, if we get her to eat better, she will improve.  I am not going to mess with her blood pressure at this point in time.  We will give her a couple more days of Decadron.  I am not sure why she had only 1 dose.  I feel bad for poor husband.  I know that he wants her to try as hard as she can.  Again, she has always had difficulty with treatment.  I know that the staff up on [address] are doing a fantastic job with her.  I very much appreciate their outstanding care.   Lattie Haw, MD  Darlyn Chamber 29:11

## 2018-06-26 NOTE — Consult Note (Addendum)
[facility] Tele[facility]urology Consult Services   Date of Service:   06/26/2018 21:23:38  Impression:     .  RO Acute Ischemic Stroke  Comments: acute onset difficulty speaking and generalized weakness - encephalopathy favored, but L frontal stroke causing aphasia is also possible. Not a tpa candidate as plts are 67. Recommend CTA head/neck  Mechanism of Stroke: Possible Thromboembolic Possible Cardioembolic Small Vessel Disease  Metrics: Last Known Well: 06/26/2018 19:00:00 [facility] Notification Time: 06/26/2018 21:23:38 Stamp Time: 06/26/2018 21:23:38 Time First Login Attempt: 06/26/2018 86:76:19 Video Start Time: 06/26/2018 21:28:42  Symptoms: garbled speech. NIHSS Start Assessment Time: 06/26/2018 21:33:12 Patient is not a candidate for tPA. Patient was not deemed candidate for tPA thrombolytics because of Plts 67. Video End Time: 06/26/2018 21:38:31  CT head showed no acute hemorrhage or acute core infarct. CT head was reviewed.  Clinical Presentation is Suggestive of Large Vessel Occlusive Disease, Recommendations are as Follows  CTA Head and Neck neg for LVO    Our recommendations are outlined below.  Recommendations:     .  Activate Stroke Protocol Admission/Order Set     .  Stroke/Telemetry Floor     .  Neuro Checks     .  Bedside Swallow Eval     .  DVT Prophylaxis     .  IV Fluids, Normal Saline     .  Head of Bed Below 30 Degrees     .  Euglycemia and Avoid Hyperthermia (PRN Acetaminophen)     .  Hold Antithrombotics for Now  Routine Consultation with [facility] for Follow up Care  Sign Out:     .  Discussed with Primary Attending    ------------------------------------------------------------------------------  History of Present Illness: Patient is a 63 year old Female.  Inpatient stroke alert was called for symptoms of garbled speech.  63 yo woman admitted for refractory myeloma and hypocalcemia. She was LSN  at approx 1900 and was later found at approx 2059 with garbled speech. No LOC/convulson. No focal/lateralizing weakness of her limbs.  CT head showed no acute hemorrhage or acute core infarct. CT head was reviewed.    Examination: 1A: Level of Consciousness - Arouses to minor stimulation + 1 1B: Ask Month and Age - Could Not Answer Either Question Correctly + 2 1C: Blink Eyes & Squeeze Hands - Performs 0 Tasks + 2 2: Test Horizontal Extraocular Movements - Normal + 0 3: Test Visual Fields - No Visual Loss + 0 4: Test Facial Palsy (Use Grimace if Obtunded) - Normal symmetry + 0 5A: Test Left Arm Motor Drift - No Effort Against Gravity + 3 5B: Test Right Arm Motor Drift - No Effort Against Gravity + 3 6A: Test Left Leg Motor Drift - No Effort Against Gravity + 3 6B: Test Right Leg Motor Drift - No Effort Against Gravity + 3 7: Test Limb Ataxia (FNF/Heel-Shin) - No Ataxia + 0 8: Test Sensation - Normal; No sensory loss + 0 9: Test Language/Aphasia - Mute/Global Aphasia: No Usable Speech/Auditory Comprehension + 3 10: Test Dysarthria - Mute/Anarthric + 2 11: Test Extinction/Inattention - No abnormality + 0  NIHSS Score: 22  Patient was informed the Neurology Consult would happen via TeleHealth consult by way of interactive audio and video telecommunications and consented to receiving care in this manner.  Due to the immediate potential for life-threatening deterioration due to underlying acute neurologic illness, I spent 35 minutes providing critical care. This time includes time for face to face visit  via telemedicine, review of medical records, imaging studies and discussion of findings with providers, the patient and/or family.   Dr Hal Morales   [facility] [phone]  Case [id number]

## 2018-06-26 NOTE — Evaluation (Signed)
Physical Therapy Evaluation Patient Details Name: Barbara Cook MRN: [id number] DOB: February 25, 1956 Today's Date: 06/26/2018   History of Present Illness  Patient is a 63 y/o female with IgG kappa myeloma, currently undergoing treatment. PT consulted for general deconditioning and weakness.     Clinical Impression  Patient admitted with the above listed diagnosis. Husband providing PLOF as patient was engaged in eating yogurt. Husband stating patient was Mod I with mobility with SPC prior to admission. Patient today requiring Min A from PT for bed level mobility and transfers. Attempted pre-gait activities of marching in place with patient unable due to weakness/fatigue. Will recommend HHPT and 24 hr/supervision at discharge if husband able to provide this level of care and based on patient/husband preference, otherwise may need to look into short term rehab depending on clinical progression. PT to follow acutely.     Follow Up Recommendations Home health PT;Supervision/Assistance - 24 hour    Equipment Recommendations  Rolling walker with 5" wheels    Recommendations for Other Services       Precautions / Restrictions Precautions Precautions: Fall Restrictions Weight Bearing Restrictions: No      Mobility  Bed Mobility Overal bed mobility: Needs Assistance Bed Mobility: Supine to Sit     Supine to sit: Min assist     General bed mobility comments: Min A for LE and trunk management to EOB  Transfers Overall transfer level: Needs assistance Equipment used: Rolling walker (2 wheeled) Transfers: Sit to/from [facility] Sit to Stand: Min assist Stand pivot transfers: Min assist       General transfer comment: Min A to power up at bedside; Min a for pivot transfer with cueing throughout for safety and sequencing  Ambulation/Gait             General Gait Details: deferred duet o fatigue/weakness  Stairs            Wheelchair Mobility     Modified Rankin (Stroke Patients Only)       Balance Overall balance assessment: Needs assistance Sitting-balance support: Bilateral upper extremity supported;Feet supported Sitting balance-Leahy Scale: Fair     Standing balance support: Bilateral upper extremity supported;During functional activity Standing balance-Leahy Scale: Poor                               Pertinent Vitals/Pain Pain Assessment: Faces Pain Score: 0-No pain    Home Living Family/patient expects to be discharged to:: Private residence Living Arrangements: Spouse/significant other Available Help at Discharge: Family;Available 24 hours/day Type of Home: House Home Access: Stairs to enter Entrance Stairs-Rails: Right Entrance Stairs-Number of Steps: 6 Home Layout: One level Home Equipment: Cane - single point      Prior Function Level of Independence: Independent with assistive device(s)         Comments: ambulates with SPC     Hand Dominance        Extremity/Trunk Assessment   Upper Extremity Assessment Upper Extremity Assessment: Defer to OT evaluation    Lower Extremity Assessment Lower Extremity Assessment: Generalized weakness    Cervical / Trunk Assessment Cervical / Trunk Assessment: Normal  Communication   Communication: No difficulties  Cognition Arousal/Alertness: Awake/alert Behavior During Therapy: WFL for tasks assessed/performed Overall Cognitive Status: Within Functional Limits for tasks assessed  General Comments General comments (skin integrity, edema, etc.): husband present and supportive; SpO2 on RA 98%, HR 78    Exercises     Assessment/Plan    PT Assessment Patient needs continued PT services  PT Problem List Decreased strength;Decreased activity tolerance;Decreased balance;Decreased mobility;Decreased knowledge of use of DME;Decreased safety awareness       PT Treatment  Interventions DME instruction;Gait training;Stair training;Functional mobility training;Therapeutic activities;Therapeutic exercise;Balance training;Patient/family education    PT Goals (Current goals can be found in the Care Plan section)  Acute Rehab PT Goals Patient Stated Goal: regain strength PT Goal Formulation: With patient Time For Goal Achievement: 07/10/18 Potential to Achieve Goals: Fair    Frequency Min 3X/week   Barriers to discharge        Co-evaluation               AM-PAC PT "6 Clicks" Mobility  Outcome Measure Help needed turning from your back to your side while in a flat bed without using bedrails?: A Little Help needed moving from lying on your back to sitting on the side of a flat bed without using bedrails?: A Little Help needed moving to and from a bed to a chair (including a wheelchair)?: A Little Help needed standing up from a chair using your arms (e.g., wheelchair or bedside chair)?: A Little Help needed to walk in hospital room?: A Lot Help needed climbing 3-5 steps with a railing? : Total 6 Click Score: 15    End of Session Equipment Utilized During Treatment: Gait belt Activity Tolerance: Patient tolerated treatment well Patient left: in chair;with call bell/phone within reach;with nursing/sitter in room;with family/visitor present Nurse Communication: Mobility status PT Visit Diagnosis: Unsteadiness on feet (R26.81);Other abnormalities of gait and mobility (R26.89);Muscle weakness (generalized) (M62.81)    Time: 0102-7253 PT Time Calculation (min) (ACUTE ONLY): 35 min   Charges:   PT Evaluation $PT Eval Moderate Complexity: 1 Mod PT Treatments $Therapeutic Activity: 8-22 mins       Lanney Gins, PT, DPT Supplemental Physical Therapist 06/26/18 10:22 AM Pager: [phone] Office: [phone]

## 2018-06-26 NOTE — Progress Notes (Signed)
Ct head and neck w contrast negative. Pt still confused and considered Stepdown bed but vitals stable and floor RN doing a great job w pt -after discussion w the Rapid Response RN decided to continue close monitoring and support on floor

## 2018-06-26 NOTE — Significant Event (Signed)
Rapid Response Event Note  Overview: Time Called: 2022 Arrival Time: 2030 Event Type: Neurologic  Initial Focused Assessment: Called to bedside for patient having increased general weakness and change in mentation. Patient husband at bedside. Patient lying in bed. Patient intermittently following commands, PERRLA, grips moderate and equal. VSS and unchanged from prior, patient on room air. Lung sounds clear in upper and lower lobes. Primary RN spoke with MD on call and the decision was made to stop the chemotherapy infusion. Infusion stopped. Patient speech became intermittently incoherent. Concern for possible stroke. No facial droop. Symptoms of stroke are weakness, lethargic, worsening altered mental status, garbled speech.    Interventions: Chemotherapy infusion stopped, MD on-call made aware.  Code stroke called: 2100 [place]: 1915 - per patient's husband, patient was much more alert at shift change RN notified MD on call that a code stroke was called Arrival to CT: 2110 CBG checked: [place] neurologist consulted: 2115 Tele neurologist also ordered CTA, test began at: 2138  Plan of Care (if not transferred): I spoke with Dr. Jana Hakim in regards to patient current level of care. I explained to him that myself and the bedside RN's current level of concern were the patient's change in mentation, lethargy, and increased work of breathing, but that her work of breathing appears to be related to increased movement and frequent incontinent clean-ups she's had since my time at the bedside. MD ordered for foley catheter to be placed. MD under the impression that her lethargy and change in mentation are related to the chemotherapy she is receiving. He feels confident in the patient's current level of care since her vital signs have remained unchanged.  RN to continue to monitor patient for change in mentation, work of breathing, ability to follow commands, and vital signs.   Family at bedside and  informed about test results.   Event Summary:  Casimer Bilis

## 2018-06-26 NOTE — Progress Notes (Signed)
Called by RN re altered MS while receiving Infusional chemotherapy (4 hrs into 22 hr infusion, started 4pm); infusion held, IV antiemetics added; non contrast head CT results discussed w Dr [place] Crane in radioloqy. No bleed but cannot r/o large vessel occlusion. Neurologic evaluation: not a TPA candidate due to low platelets. Ct angio head and neck ordered, results pending   Have canceled rest of tonight's chemo infusion. Dr Marin Olp to discuss whether or not to proceed with further therapy this admission   Barbara Cook   DOB:13-Oct-1955   TG#:[id number]   EVO#:[id number]  Subjective:  Patient responds to touch and voice but cannot answer intelligibly; husband is in room; he tells me patient was fine at the start of the chemo infusion (about 10 hours ago) but had a "bad shake" about four hours ago "and hasn't been right since."   Objective: middle aged African American woman examined in bed Vitals:   07/20/18 0033 07-20-2018 0055  BP: (!) 109/36 (!) 92/42  Pulse: (!) 106   Resp: (!) 28   Temp: 99.8 F (37.7 C)   SpO2: 94%     There is no height or weight on file to calculate BMI.  Intake/Output Summary (Last 24 hours) at 07/20/18 0156 Last data filed at 06/26/2018 1353 Gross per 24 hour  Intake 360 ml  Output 1450 ml  Net -1090 ml     EOMs intact  Lungs breath sounds bilaterally, no rhonchi--auscultated anterolaterally  Heart regular rate and rhythm  Abdomen soft, did not hear BS  Neuro nonfocal    CBG (last 3)  No results for input(s): GLUCAP in the last 72 hours.   Labs:  Lab Results  Component Value Date   WBC 5.0 06/26/2018   HGB 9.5 (L) 06/26/2018   HCT 31.1 (L) 06/26/2018   MCV 96.3 06/26/2018   PLT 67 (L) 06/26/2018   NEUTROABS 4.6 06/26/2018    _0 @  Urine Studies No results for input(s): UHGB, CRYS in the last 72 hours.  Invalid input(s): UACOL, UAPR, USPG, UPH, UTP, UGL, UKET, UBIL, UNIT, UROB, [place], UEPI, UWBC, [place], [place], [place], [place], [place]  Basic  Metabolic Panel: Recent Labs  Lab 07/04/2018 1206 06/23/18 0109 06/24/18 0602 06/25/18 0611 06/26/18 0525  NA 132* 135 133* 134* 134*  K 3.9 4.1 4.3 4.0 4.3  CL 107 112* 112* 116* 117*  CO2 22 19* 18* 16* 15*  GLUCOSE 96 73 176* 153* 131*  BUN _1 31*  CREATININE 1.13* 1.16* 1.07* 1.23* 1.31*  CALCIUM 11.2* 10.6* 9.6 8.9 8.4*   GFR Estimated Creatinine Clearance: 42.2 mL/min (A) (by C-G formula based on SCr of 1.31 mg/dL (H)). Liver Function Tests: Recent Labs  Lab 07/01/2018 1206 06/23/18 0109 06/24/18 0602 06/25/18 0611 06/26/18 0525  AST 50* 60* 81* 94* 133*  ALT 14 21 45* 57* 56*  ALKPHOS 28* 31* 27* 29* 28*  BILITOT 0.4 0.9 0.6 0.7 0.4  PROT 11.5* 11.7* 11.3* 10.6* 10.1*  ALBUMIN 3.0* 2.6* 2.3* 2.3* 2.2*   No results for input(s): LIPASE, AMYLASE in the last 168 hours. No results for input(s): AMMONIA in the last 168 hours. Coagulation profile No results for input(s): INR, PROTIME in the last 168 hours.  CBC: Recent Labs  Lab 06/23/18 0109 06/24/18 0602 06/25/18 0611 06/26/18 0525  WBC 3.6* 3.8* 6.5 5.0  NEUTROABS  --  3.0 5.8 4.6  HGB 9.6* 10.0* 9.7* 9.5*  HCT 30.1* 32.6* 31.5* 31.1*  MCV 92.9 92.9 94.3 96.3  PLT 79* 77* 73* 67*   Cardiac Enzymes: No results for input(s): CKTOTAL, CKMB, CKMBINDEX, TROPONINI in the last 168 hours. BNP: Invalid input(s): POCBNP CBG: No results for input(s): GLUCAP in the last 168 hours. D-Dimer No results for input(s): DDIMER in the last 72 hours. Hgb A1c No results for input(s): HGBA1C in the last 72 hours. Lipid Profile No results for input(s): CHOL, HDL, LDLCALC, TRIG, CHOLHDL, LDLDIRECT in the last 72 hours. Thyroid function studies No results for input(s): TSH, T4TOTAL, T3FREE, THYROIDAB in the last 72 hours.  Invalid input(s): FREET3 Anemia work up No results for input(s): VITAMINB12, FOLATE, FERRITIN, TIBC, IRON, RETICCTPCT in the last 72 hours. Microbiology No results found for this or any  previous visit (from the past 240 hour(s)).    Studies:  Ct Angio Head W Or Wo Contrast  Result Date: 06/26/2018 CLINICAL DATA:  Speech difficulties. EXAM: CT ANGIOGRAPHY HEAD AND NECK TECHNIQUE: Multidetector CT imaging of the head and neck was performed using the standard protocol during bolus administration of intravenous contrast. Multiplanar CT image reconstructions and MIPs were obtained to evaluate the vascular anatomy. Carotid stenosis measurements (when applicable) are obtained utilizing NASCET criteria, using the distal internal carotid diameter as the denominator. CONTRAST:  112m ISOVUE-370 IOPAMIDOL (ISOVUE-370) INJECTION 76% COMPARISON:  None. FINDINGS: Head CT 06/26/2018 CTA NECK FINDINGS SKELETON: There is no bony spinal canal stenosis. No lytic or blastic lesion. OTHER NECK: Normal pharynx, larynx and major salivary glands. No cervical lymphadenopathy. Unremarkable thyroid gland. UPPER CHEST: No pneumothorax or pleural effusion. No nodules or masses. AORTIC ARCH: There is no calcific atherosclerosis of the aortic arch. There is no aneurysm, dissection or hemodynamically significant stenosis of the visualized ascending aorta and aortic arch. Conventional 3 vessel aortic branching pattern. The visualized proximal subclavian arteries are widely patent. RIGHT CAROTID SYSTEM: --Common carotid artery: Widely patent origin without common carotid artery dissection or aneurysm. --Internal carotid artery: Normal without aneurysm, dissection or stenosis. --External carotid artery: No acute abnormality. LEFT CAROTID SYSTEM: --Common carotid artery: Widely patent origin without common carotid artery dissection or aneurysm. --Internal carotid artery: Normal without aneurysm, dissection or stenosis. --External carotid artery: No acute abnormality. VERTEBRAL ARTERIES: Codominant configuration. Both origins are normal. No dissection, occlusion or flow-limiting stenosis to the vertebrobasilar confluence. CTA  HEAD FINDINGS POSTERIOR CIRCULATION: --Basilar artery: Normal. --Posterior cerebral arteries: Normal. Both originate from the basilar artery. --Superior cerebellar arteries: Normal. --Inferior cerebellar arteries: Normal anterior and posterior inferior cerebellar arteries. ANTERIOR CIRCULATION: --Intracranial internal carotid arteries: Normal. --Anterior cerebral arteries: Normal. Both A1 segments are present. Patent anterior communicating artery. --Middle cerebral arteries: Normal. --Posterior communicating arteries: Present bilaterally. VENOUS SINUSES: As permitted by contrast timing, patent. ANATOMIC VARIANTS: None DELAYED PHASE: No parenchymal contrast enhancement. Review of the MIP images confirms the above findings. IMPRESSION: Normal CTA of the head and neck. Electronically Signed   By: KUlyses JarredM.D.   On: 06/26/2018 22:22   Ct Angio Neck W Or Wo Contrast  Result Date: 06/26/2018 CLINICAL DATA:  Speech difficulties. EXAM: CT ANGIOGRAPHY HEAD AND NECK TECHNIQUE: Multidetector CT imaging of the head and neck was performed using the standard protocol during bolus administration of intravenous contrast. Multiplanar CT image reconstructions and MIPs were obtained to evaluate the vascular anatomy. Carotid stenosis measurements (when applicable) are obtained utilizing NASCET criteria, using the distal internal carotid diameter as the denominator. CONTRAST:  1095mISOVUE-370 IOPAMIDOL (ISOVUE-370) INJECTION 76% COMPARISON:  None. FINDINGS: Head CT 06/26/2018 CTA NECK FINDINGS SKELETON: There is no bony  spinal canal stenosis. No lytic or blastic lesion. OTHER NECK: Normal pharynx, larynx and major salivary glands. No cervical lymphadenopathy. Unremarkable thyroid gland. UPPER CHEST: No pneumothorax or pleural effusion. No nodules or masses. AORTIC ARCH: There is no calcific atherosclerosis of the aortic arch. There is no aneurysm, dissection or hemodynamically significant stenosis of the visualized ascending  aorta and aortic arch. Conventional 3 vessel aortic branching pattern. The visualized proximal subclavian arteries are widely patent. RIGHT CAROTID SYSTEM: --Common carotid artery: Widely patent origin without common carotid artery dissection or aneurysm. --Internal carotid artery: Normal without aneurysm, dissection or stenosis. --External carotid artery: No acute abnormality. LEFT CAROTID SYSTEM: --Common carotid artery: Widely patent origin without common carotid artery dissection or aneurysm. --Internal carotid artery: Normal without aneurysm, dissection or stenosis. --External carotid artery: No acute abnormality. VERTEBRAL ARTERIES: Codominant configuration. Both origins are normal. No dissection, occlusion or flow-limiting stenosis to the vertebrobasilar confluence. CTA HEAD FINDINGS POSTERIOR CIRCULATION: --Basilar artery: Normal. --Posterior cerebral arteries: Normal. Both originate from the basilar artery. --Superior cerebellar arteries: Normal. --Inferior cerebellar arteries: Normal anterior and posterior inferior cerebellar arteries. ANTERIOR CIRCULATION: --Intracranial internal carotid arteries: Normal. --Anterior cerebral arteries: Normal. Both A1 segments are present. Patent anterior communicating artery. --Middle cerebral arteries: Normal. --Posterior communicating arteries: Present bilaterally. VENOUS SINUSES: As permitted by contrast timing, patent. ANATOMIC VARIANTS: None DELAYED PHASE: No parenchymal contrast enhancement. Review of the MIP images confirms the above findings. IMPRESSION: Normal CTA of the head and neck. Electronically Signed   By: Ulyses Jarred M.D.   On: 06/26/2018 22:22   Ct Head Code Stroke Wo Contrast  Result Date: 06/26/2018 CLINICAL DATA:  Code stroke. 63 year old female with confusion, slurred speech and altered mental status. Last seen normal 1900 hours. Multiple myeloma. EXAM: CT HEAD WITHOUT CONTRAST TECHNIQUE: Contiguous axial images were obtained from the base of  the skull through the vertex without intravenous contrast. COMPARISON:  Brain MRI 02/29/2008. FINDINGS: Brain: Cerebral volume remains normal. Mild streak artifact in the middle cranial fossa. Gray-white matter differentiation appears normal for age throughout the brain. No midline shift, ventriculomegaly, mass effect, evidence of mass lesion, intracranial hemorrhage or evidence of cortically based acute infarction. Vascular: There is mild asymmetric hyperdensity of the distal right MCA M1 (series 5, image 15 and series 2, image 7). Skull: Minimal heterogeneity of skull mineralization. No acute osseous abnormality identified. Sinuses/Orbits: Moderate new mucosal thickening or retention cyst in the left sphenoid sinus since 2009. New right mastoid effusion. Other visible paranasal sinuses and left mastoids are clear. Other: No acute orbit or scalp soft tissue finding. ASPECTS [facility] Stroke Program Early CT Score) - Ganglionic level infarction (caudate, lentiform nuclei, internal capsule, insula, M1-M3 cortex): 7 - Supraganglionic infarction (M4-M6 cortex): 3 Total score (0-10 with 10 being normal): 10 IMPRESSION: 1. Normal for age noncontrast CT appearance of the brain except for questionable abnormal hyperdense right MCA. Query left side weakness. 2. ASPECTS is 10. 3. Study discussed by telephone with Dr. Jana Hakim on 06/26/2018 at 21:29 . Electronically Signed   By: Genevie Ann M.D.   On: 06/26/2018 21:31    Assessment: 63 y.o. [place] woman with a history of relapsed myeloma, admitted for cgemotherapy, started day 4 of bortezomib, cisplatin, cytoxan, decadron and doxorubicin about 4 PM 06/26/2018, developed altered mental state about 8 PM, evaluated for stroke w head and neck angio, which was negative; now with persistent encephalopathy, hypotension, SOB and temp >101  (1) wrote for troponin, ECG, pCXR, and labs  (2) blood cultures being  drawn and Zosyn started  (3) critical care consulted   Plan:  At  this point sepsis vs aspiration pneumonia is the working diagnosis. Patient may require intubation.   I discussed the situation with the patient's husband. His wife has an incurable disease but there is treatment available.  He tells me they have a living will, but cannot tell me what the living will says. He undretsnds his wife may need to have ventilatory support--"be on a machine"--if she can't breathe on her own. He felt overwhelmed but said "do what you need to do."  Patient remains a full code.   Chauncey Cruel, MD Jul 06, 2018  1:56 AM Medical Oncology and Hematology [facility] [address] [place], [place] [address] Tel. [phone]    Fax. [phone]

## 2018-06-27 ENCOUNTER — Inpatient Hospital Stay (HOSPITAL_COMMUNITY): Payer: BLUE CROSS/BLUE SHIELD

## 2018-06-27 ENCOUNTER — Encounter: Payer: Self-pay | Admitting: Hematology & Oncology

## 2018-06-27 ENCOUNTER — Other Ambulatory Visit: Payer: Self-pay

## 2018-06-27 DIAGNOSIS — R6521 Severe sepsis with septic shock: Secondary | ICD-10-CM

## 2018-06-27 DIAGNOSIS — E872 Acidosis, unspecified: Secondary | ICD-10-CM

## 2018-06-27 DIAGNOSIS — C9002 Multiple myeloma in relapse: Secondary | ICD-10-CM

## 2018-06-27 DIAGNOSIS — N179 Acute kidney failure, unspecified: Secondary | ICD-10-CM

## 2018-06-27 DIAGNOSIS — J690 Pneumonitis due to inhalation of food and vomit: Secondary | ICD-10-CM

## 2018-06-27 DIAGNOSIS — A419 Sepsis, unspecified organism: Secondary | ICD-10-CM

## 2018-06-27 LAB — BLOOD CULTURE ID PANEL (REFLEXED)
ACINETOBACTER BAUMANNII: NOT DETECTED
CANDIDA ALBICANS: NOT DETECTED
CANDIDA TROPICALIS: NOT DETECTED
Candida glabrata: NOT DETECTED
Candida krusei: NOT DETECTED
Candida parapsilosis: NOT DETECTED
Carbapenem resistance: NOT DETECTED
Enterobacter cloacae complex: NOT DETECTED
Enterobacteriaceae species: DETECTED — AB
Enterococcus species: NOT DETECTED
Escherichia coli: DETECTED — AB
Haemophilus influenzae: NOT DETECTED
Klebsiella oxytoca: NOT DETECTED
Klebsiella pneumoniae: NOT DETECTED
Listeria monocytogenes: NOT DETECTED
Neisseria meningitidis: NOT DETECTED
Proteus species: NOT DETECTED
Pseudomonas aeruginosa: NOT DETECTED
STREPTOCOCCUS AGALACTIAE: NOT DETECTED
Serratia marcescens: NOT DETECTED
Staphylococcus aureus (BCID): NOT DETECTED
Staphylococcus species: NOT DETECTED
Streptococcus pneumoniae: NOT DETECTED
Streptococcus pyogenes: NOT DETECTED
Streptococcus species: NOT DETECTED

## 2018-06-27 LAB — BLOOD GAS, ARTERIAL
ACID-BASE DEFICIT: 16.3 mmol/L — AB (ref 0.0–2.0)
BICARBONATE: 12.5 mmol/L — AB (ref 20.0–28.0)
Drawn by: 23532
O2 Content: 2 L/min
O2 Saturation: 81.3 %
Patient temperature: 100.8
pCO2 arterial: 46.2 mmHg (ref 32.0–48.0)
pH, Arterial: 7.07 — CL (ref 7.350–7.450)
pO2, Arterial: 68.6 mmHg — ABNORMAL LOW (ref 83.0–108.0)

## 2018-06-27 LAB — CBC WITH DIFFERENTIAL/PLATELET
Abs Immature Granulocytes: 0.06 10*3/uL (ref 0.00–0.07)
BASOS ABS: 0 10*3/uL (ref 0.0–0.1)
Basophils Relative: 0 %
Eosinophils Absolute: 0 10*3/uL (ref 0.0–0.5)
Eosinophils Relative: 0 %
HCT: 29.7 % — ABNORMAL LOW (ref 36.0–46.0)
Hemoglobin: 8.7 g/dL — ABNORMAL LOW (ref 12.0–15.0)
Immature Granulocytes: 36 %
Lymphocytes Relative: 35 %
Lymphs Abs: 0.1 10*3/uL — ABNORMAL LOW (ref 0.7–4.0)
MCH: 29.6 pg (ref 26.0–34.0)
MCHC: 29.3 g/dL — AB (ref 30.0–36.0)
MCV: 101 fL — ABNORMAL HIGH (ref 80.0–100.0)
MONO ABS: 0 10*3/uL — AB (ref 0.1–1.0)
Monocytes Relative: 0 %
Neutro Abs: 0.1 10*3/uL — ABNORMAL LOW (ref 1.7–7.7)
Neutrophils Relative %: 29 %
Platelets: 33 10*3/uL — ABNORMAL LOW (ref 150–400)
RBC: 2.94 MIL/uL — AB (ref 3.87–5.11)
RDW: 17 % — ABNORMAL HIGH (ref 11.5–15.5)
WBC: 0.2 10*3/uL — CL (ref 4.0–10.5)
nRBC: 0 % (ref 0.0–0.2)

## 2018-06-27 LAB — COMPREHENSIVE METABOLIC PANEL
ALBUMIN: 1.8 g/dL — AB (ref 3.5–5.0)
ALT: 40 U/L (ref 0–44)
AST: 117 U/L — ABNORMAL HIGH (ref 15–41)
Alkaline Phosphatase: 40 U/L (ref 38–126)
Anion gap: 2 — ABNORMAL LOW (ref 5–15)
BUN: 39 mg/dL — ABNORMAL HIGH (ref 8–23)
CALCIUM: 7.8 mg/dL — AB (ref 8.9–10.3)
CO2: 13 mmol/L — ABNORMAL LOW (ref 22–32)
Chloride: 122 mmol/L — ABNORMAL HIGH (ref 98–111)
Creatinine, Ser: 1.38 mg/dL — ABNORMAL HIGH (ref 0.44–1.00)
GFR calc Af Amer: 47 mL/min — ABNORMAL LOW (ref >60)
GFR calc non Af Amer: 41 mL/min — ABNORMAL LOW (ref >60)
Glucose, Bld: 95 mg/dL (ref 70–99)
Potassium: 3.4 mmol/L — ABNORMAL LOW (ref 3.5–5.1)
Sodium: 137 mmol/L (ref 135–145)
Total Bilirubin: 1.2 mg/dL (ref 0.3–1.2)
Total Protein: 8.1 g/dL (ref 6.5–8.1)

## 2018-06-27 LAB — GLUCOSE, CAPILLARY: Glucose-Capillary: 137 mg/dL — ABNORMAL HIGH (ref 70–99)

## 2018-06-27 LAB — MRSA PCR SCREENING: MRSA by PCR: NEGATIVE

## 2018-06-27 LAB — LACTIC ACID, PLASMA: Lactic Acid, Venous: 6.2 mmol/L (ref 0.5–1.9)

## 2018-06-27 MED ORDER — VANCOMYCIN HCL 10 G IV SOLR
1750.0000 mg | Freq: Once | INTRAVENOUS | Status: AC
  Administered 2018-06-27: 1750 mg via INTRAVENOUS
  Filled 2018-06-27: qty 1750

## 2018-06-27 MED ORDER — STERILE WATER FOR INJECTION IV SOLN
INTRAVENOUS | Status: DC
  Administered 2018-06-27: 04:00:00 via INTRAVENOUS
  Filled 2018-06-27: qty 850

## 2018-06-27 MED ORDER — SODIUM BICARBONATE 8.4 % IV SOLN
100.0000 meq | Freq: Once | INTRAVENOUS | Status: AC
  Administered 2018-06-27: 100 meq via INTRAVENOUS
  Filled 2018-06-27: qty 100

## 2018-06-27 MED ORDER — PIPERACILLIN-TAZOBACTAM 3.375 G IVPB
3.3750 g | Freq: Three times a day (TID) | INTRAVENOUS | Status: DC
  Administered 2018-06-27: 3.375 g via INTRAVENOUS
  Filled 2018-06-27: qty 50

## 2018-06-27 MED ORDER — NOREPINEPHRINE 4 MG/250ML-% IV SOLN
0.0000 ug/min | INTRAVENOUS | Status: DC
  Administered 2018-06-27: 2 ug/min via INTRAVENOUS
  Administered 2018-06-27: 5 ug/min via INTRAVENOUS
  Filled 2018-06-27: qty 250

## 2018-06-27 MED ORDER — VANCOMYCIN HCL IN DEXTROSE 1-5 GM/200ML-% IV SOLN: 1000.0000 mg | INTRAVENOUS | Status: DC

## 2018-06-27 MED ORDER — LACTATED RINGERS IV BOLUS
2000.0000 mL | Freq: Once | INTRAVENOUS | Status: AC
  Administered 2018-06-27: 2000 mL via INTRAVENOUS

## 2018-06-27 MED FILL — Medication: Qty: 2 | Status: AC

## 2018-06-29 LAB — CULTURE, BLOOD (ROUTINE X 2): Special Requests: ADEQUATE

## 2018-07-09 NOTE — Progress Notes (Signed)
eLink Physician-Brief Progress Note Patient Name: Barbara Cook DOB: 1956/02/17 MRN: [id number]   Date of Service  2018/07/05  HPI/Events of Note  Husband reluctant to have wife intubated. Patient proceeded to become bradycardic and lost pulse. Code called and CPR initiated. Patient intubated by ED physician. ROSC obtained after Epinephrine and NaHCO3 IV.   eICU Interventions  Will order: 1. ABG STAT. 2. Titrate Norepinephrine IV infusion to support hemodynamics.  3. Portable CXR STAT.  4. Ventilator settings: 100%/PRVC 18/TV 640/P 5.     Intervention Category Major Interventions: Code management / supervision  Tali Cleaves Cornelia Copa 07/05/2018, 5:26 AM

## 2018-07-09 NOTE — Progress Notes (Signed)
CRITICAL VALUE ALERT  Critical Value: Lactic Acid 6.2  Date & Time Notied:  0400  Provider Notified: Dr. Emmit Alexanders  Orders Received/Actions taken: 2L LR boluses infusing, will continue to monitor

## 2018-07-09 NOTE — Progress Notes (Signed)
I am absolutely devastated by the events of last night.  Ms. Cisse passed away this morning.  A lot of staff helped her out so so much.  I very much appreciate the interventions that were made to try to help her.  Shockingly, she got neutropenic incredibly quickly.  This had to have been the problem.  She had a white cell count of 5000 yesterday morning.  This morning, her white cell count was 0.2.  I am sure that she somehow got bacteremic and septic.  She had reduced dose of treatment.  I am very surprised that she had her drop in white cell count so quickly.  I typically do not see this with this chemotherapy protocol.  Typically it begins to happen after treatment has been completed.  She had chest x-ray which looked okay.  Her renal function looked all right.  Her potassium was minimally low.  Her LFTs were slightly elevated which does not surprise me with this protocol.  I talked to her husband and daughter at length.  I have known them for probably 6 years.  He did everything that he could for her.  He was always there with her.  He even had his gallbladder surgery canceled last Friday so that he could be with her.  I know that she had some difficulties with treatment and had treatment delays but she did all that her body could do to try to get through this myeloma.  I am just very upset that we have lost her.  I really want to try to get her to CAR-T therapy.  I know that my staff will have a very hard time hearing the news that Ms. Finfrock has passed onto [place].  I know that I will see her again.  She is now free of myeloma.  She is not hurting.  She does not have bone fractures.  She does not have to deal with her IBS.  We are the ones to lose.  Her family is just really having a hard time which I totally understand.  We will try to help them as much as possible.  Lattie Haw, MD  2 Timothy 4:16-18

## 2018-07-09 NOTE — Progress Notes (Signed)
After Probation officer received report following shift change, pt's husband was concerned about pt's lethargy and the lack of conversation. Told me that patient was talking and remembering like "old times" during the day. Pt then had two episodes of vomiting. Called RRT to get a second look at patient. Called MD on call to update him on pt's status and he advised me to stop chemo.  Patient had AMS, was ordered to get a CT of head and CTA of head and neck. Patient arrived back on unit, and had two episodes of diarrhea requiring bed changes. Bed changes causing the patient to become weaker due to too much activity, MD advised writer to place foley cath.  After placing foley cath, RN noticed that patient's breathing became labored, RR 28 but SpO2 96. Started on 2L of O2 via [place] to help patient with breathing.  Change in VS, BP 92/42 manually, T 99.8, P 106 prompted call for MD to transfer off unit to stepdown.

## 2018-07-09 NOTE — Discharge Summary (Signed)
This is a death summary for [facility].  Her date of birth is December 31, 1955.  Her date of admission was July 11, 2018.  Her date of death is 07/16/18.  Her diagnosis upon death is: #1 E. coli bacteremia 2.  Refractory IgG kappa multiple myeloma 3.  Hypercalcemia  Hospital course:  Ms. Panjwani was admitted to 6 E. for IV chemotherapy.  She had refractory IgG kappa myeloma.  Her myeloma levels were rising quickly.  Her M spike was 5.7 g/dL.  Her IgG level was 8051 mg/dL.  Her kappa light chain was 181 mg/L.  She was admitted for IV chemotherapy with VD-PACE.  She was admitted from the office.  In the office, she was anemic.  She had blood.  She also had an element of hypercalcemia.  She was started on chemotherapy.  She had no problems with systemic chemotherapy.  She was somewhat lethargic.  She seemed to improve.  IV Decadron, which was part of the protocol, was probably helping with her functional status.  On Monday, she was receiving chemotherapy.  Her CBC showed a white cell count of 5.  Hemoglobin 9.5 platelet count 67,000.  Her sodium was 134.  Potassium 4.3.  BUN 31 creatinine 1.31.  Her calcium 8.4 with an albumin of 2.2.  She seemed to be doing a bit better.  Her husband said that she was a little more alert.  She seemed to be more active and eating.  Apparently, that evening, she had a downturn.  She became more lethargic.  She apparently started having chills.  She was evaluated for the possibility of a stroke.  She had a CT angiogram of the brain.  This was normal.  She was seen by Dr. Jana Hakim our on-call physician.  As always, he did a fantastic job.  He worked very closely with the nurses on the floor.  The rapid response team was called.  It was felt that she was somewhat stable and that she could stay on the floor.  Unfortunately, she continued to decline.  She had more lethargy.  She was really not talking.  She had an episode of emesis.  She was then moved down to the ICU.  She  was evaluated by critical care.  She was felt to be in septic shock.  She had lab work done.  Shockingly, her white cell count was down to 0.2.  She was on BiPAP.  Cultures were taken.  She is started on IV fluids and IV antibiotics with Zosyn and vancomycin.  Her husband, who is with her the entire time, felt that we should pursue aggressive intervention.  He had a hard time with that decision.  She was then intubated.  She was coded.  She was bradycardic.  She seemed to respond.  However, she then underwent another episode of cardiac arrest.  Her family decided that further heroic measures should not be taken.  She was pronounced at 0546.  Cultures came back after her passing.  She had E. coli in her blood.  I am just very disappointed that she cannot make it to the CAR-T protocol.  I thought if we could get her to that protocol that she would respond.  Her family was very thankful for the wonderful care that she got in the hospital and by the efforts afforded by the ICU staff in the staff on 6 E.  With profound sorrow, this is a death summary for [facility].  Lattie Haw, MD  2 Timothy 4:16-18

## 2018-07-09 NOTE — Progress Notes (Signed)
CRITICAL VALUE ALERT  Critical Value:  WBC 0.2  Date & Time Notied:  0300  Provider Notified: Aljishi  Orders Received/Actions taken: Abx starting. Will continue to monitor

## 2018-07-09 NOTE — Progress Notes (Signed)
[facility] Progress Note Patient Name: Barbara Cook DOB: Aug 07, 1955 MRN: [id number]   Date of Service  06-29-18  HPI/Events of Note  ABG on [place] O2 = 7.07/46.2/68.6. Patient placed on BiPAP.   eICU Interventions  Will order: 1. NaHCO3 100 meq IV now.  2. NaHCO3 IV infusion to run at 100 mL/hour.  3. Repeat ABG at 5 AM.  4. Lactic Acid level now.      Intervention Category Major Interventions: Acid-Base disturbance - evaluation and management  [name] 06/29/2018, 2:59 AM

## 2018-07-09 NOTE — Progress Notes (Signed)
Chaplain page to provided emotional support to family who was reported to be very worried about Pt. While chaplain was in the room Pt started to decline. Pt coded. CPR unsuccessful.   Family asked chaplain to call their church home/pastoral  [place] ([employer]) [phone]  Church: [facility]

## 2018-07-09 NOTE — Progress Notes (Signed)
eLink Physician-Brief Progress Note Patient Name: Barbara Cook DOB: 09/25/1955 MRN: [id number]   Date of Service  2018-07-16  HPI/Events of Note  Lactic Acid = 6.2 - Patient already getting ordered LR boluses.   eICU Interventions  Plan: 1. Continue present management. 2. Trend serial lactic acid levels.      Intervention Category Major Interventions: Acid-Base disturbance - evaluation and management  Alondra Vandeven Eugene 07-16-2018, 4:06 AM

## 2018-07-09 NOTE — ED Provider Notes (Signed)
I responded to CODE BLUE in the ICU.  CPR was in progress. Patient appeared to have a  very wide low voltage rhythm on the monitor.  We checked for pulses and there were none.  CPR was continued and she was given epinephrine, she had already had 1 amp of bicarb and epinephrine prior to me arriving in the room.  INTUBATION Performed by: Janice Norrie  Required items: required blood products, implants, devices, and special equipment available Patient identity confirmed: provided demographic data and hospital-assigned identification number Time out: Immediately prior to procedure a "time out" was called to verify the correct patient, procedure, equipment, support staff and site/side marked as required.  Indications: CPR without spontaneous respirations  Intubation method: Glidescope Laryngoscopy   Preoxygenation: BVM  Sedatives: None Paralytic: None  Tube Size: 7.5 cuffed  Post-procedure assessment: chest rise and ETCO2 monitor Breath sounds: equal and absent over the epigastrium Tube secured with: ETT holder   When I first looked there was a lot of blood in the hypopharynx.  Suction was not functioning properly and patient was re-bagged.  On second look I was able to suction the blood from around her airway.  The ET tube was inserted without difficulty.  She had immediate change of the PCO2 monitor.  However she also had a lot of blood in the ET tube which respiratory suctioned.  Shortly after this patient appeared to have a tachycardic rhythm on the monitor.  Heart rate was in the 120-130 range.  Her initial blood pressure was 485 systolic.  CPR had stopped. Respiratory states she is having rare gasping respiration.  At this point Dr. Emmit Alexanders was monitoring via camera and had contacted her primary care doctors.  I left the ICU at 5:27 AM.    I responded to a second CODE BLUE at 5:38 AM.  Patient remains intubated without difficulty of her airway.  CPR is in progress again however her  oncologist, Dr. Heber Scurry was at the bedside and states he will manage the CPR.  I left at 5:40 AM.      Rolland Porter, MD 2018-07-05 [phone]

## 2018-07-09 NOTE — Progress Notes (Signed)
PCCM INTERVAL PROGRESS NOTE   Called to bedside for cardiac arrest. Briefly 62 year old female with refractory multiple myeloma. Has failed 5-6 treatment regimens. Was admitted for aggressive IV chemo with hopes she could have some improvement to maybe be considered for clinical trial. She did not tolerate first dose of chemo well. Then 2/17 late PM she likely vomited and aspirated leading to precipitous decline. She was placed on BiPAP. Intubation was offered by Dr. Aljishi, but family declined. ABG demonstrated profound mixed acidosis. She ultimately suffered a cardiac arrest initially 18 minutes in duration. She was intubated by EDP. Intubation notable for blood and posterior pharynx and ETT. ROSC was achieved after several pushes of epinephrine and sodium bicarb.   This is when I arrived at bedside. ROSC unfortunately was short-lived. She became bradycardic and then asystolic. Underwent 4 additional rounds of ACLS/CPR with family at bedside. I discussed the seriousness of the situation, and how a cardiac arrest would likely affect her treatment and overall quality of life, even in the best case scenario (considering her comorbid conditions). They requested that we "let her go". At that time pulse check was done. She had no pulse. PEA on monitor.   Time of death called 0546.  Family was at bedside for this. They left the room for her to be extubated and promptly returned. Chaplain has been with them.  Paul Hoffman, AGACNP-BC [facility] Pager [phone] or [phone]  07/05/2018 5:52 AM     

## 2018-07-09 NOTE — Progress Notes (Signed)
Pharmacy Antibiotic Note  Barbara Cook is a 63 y.o. female admitted on 06/29/2018 with sepsis.  Pharmacy has been consulted for vancomycin dosing.  Plan: Zosyn 3.375g IV q8h (4 hour infusion). (MD) Vancomycin 1750 mg x1 then 1 Gm IV q24h for est AUC = 522 Goal AUC = 400-550 F/u scr/cultures/levels Daily scr  Weight: 177 lb 4 oz (80.4 kg)  Temp (24hrs), Avg:99.2 F (37.3 C), Min:97.3 F (36.3 C), Max:101.4 F (38.6 C)  Recent Labs  Lab 06/23/18 0109 06/24/18 0602 06/25/18 0611 06/26/18 0525 07/07/2018 0200  WBC 3.6* 3.8* 6.5 5.0 0.2*  CREATININE 1.16* 1.07* 1.23* 1.31* 1.38*    Estimated Creatinine Clearance: 41.9 mL/min (A) (by C-G formula based on SCr of 1.38 mg/dL (H)).    Allergies  Allergen Reactions  . Barium-Containing Compounds Diarrhea    Oral Redicat gives pt severe diarrhea, causing delay in scan time, see previous ct scan notes for details  . Codeine Palpitations    Antimicrobials this admission: 2/18 zosyn >>  2/18 vancomycin >>   Dose adjustments this admission:   Microbiology results:  BCx:   UCx:    Sputum:    MRSA PCR:   Thank you for allowing pharmacy to be a part of this patient's care.  Dorrene German 07-07-2018 3:26 AM

## 2018-07-09 NOTE — Consult Note (Signed)
NAME:  Barbara Cook, MRN:  [id number], DOB:  1955/11/14, LOS: 5 ADMISSION DATE:  06/28/2018, CONSULTATION DATE:  2/18 REFERRING MD:  Dr. Marin Olp, CHIEF COMPLAINT:  AMS  Brief History   63 year old female with multiple myeloma admitted for progressive weakness. 2/18 she developed respiratory distress and PCCM was consulted.  History of present illness   63 year old female with PMH as below, which is significant for multiple myeloma, IBS, Anemia, PMR, and GERD. She is followed by Dr. Marin Olp for MM, for which she has been refractory to several treatment regimens and was about to get started with a clinical trial in [place]. Unfortunately her condition has rather quickly worsened and she has been much more weak. She was admitted 2/13 for aggressive IV chemotherapy (VD-PACE). Despite her first dose of chemotherapy she remained weak and there was some concern by the oncology team she may not tolerate further treatments. 2/17 while 4 hours into a 22 hour chemotherapy infusion she developed AMS. CT head unremarkable. With worsening mental status and concern for airway protection PCCM has been consulted.   Patient did vomit twice on the floor and is altered, febrile on BIPAP and hypotensive. Received 543m bolus and severely acidotic. Husband at bedside.   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   Significant Hospital Events   2/13 admit 2/18 acute AMS during infusion #2.   Consults:  Neurology PCCM  Procedures:    Significant Diagnostic Tests:  CT head 2/17 > Normal for age noncontrast CT appearance of the brain except for questionable abnormal hyperdense right MCA. CTA head 2/17 > Normal CTA of the head and neck  Micro Data:  Blood 2/18 > Urine 2/17 >  Antimicrobials:  Acyclovir 2/14 > Zosyn 2/18 > vanco 2/18>  Interim history/subjective:    Objective   Blood pressure (!) 94/54, pulse (!) 135, temperature (!) 101.4 F (38.6 C), temperature source Oral, resp. rate (!) 33, weight 80.4 kg, SpO2 100 %.        Intake/Output Summary (Last 24 hours) at 203/03/200251 Last data filed at 06/26/2018 1353 Gross per 24 hour  Intake 360 ml  Output 1450 ml  Net -1090 ml   Filed Weights   [id number]  Weight: 80.4 kg    Examination: General: acutely ill toxic looking lethargic  HENT:dry  Lungs: coarse crackles on right  Cardiovascular: normal heart sounds no murmurs  Abdomen: soft no tenderness  Extremities: generalized edema  Neuro: lethargic opens her eyes and mumbles words not following commands    Resolved Hospital Problem list     Assessment & Plan:  Assessment: - septic shock - severe metabolic acidosis  - acute hypoxemic respiratory failure  - febrile neutropenia  - aspiration pneumonia  - acute metabolic encephalopathy  - AKI  - refractory multiple myeloma    Plan: - patient is on BIPAP. Discussed the risks esepcially with her recent vomiting and her tachypnea but husband want to continue on BIPAP and hold off on intubation knowing the risks. - 2 litre IVF boluses -  NaHCO3 pushes then a drip - repeat ABg - stat lactic acid  - start levophed titrate to keep MAP >65  - started zosyn and will add vanco - follow cultures  - discussed with bedside nurse - I had a long discussion with the husband about her condition and her  overall high mortality due to multiorgan failure and neutropenia. He states that he never knew that she was this sick.   I have spent 50 mins of CC time bedside or in the unit exclusive of billable procedures   Best practice:  Diet: NPO Pain/Anxiety/Delirium protocol (if indicated): VAP protocol (if indicated):  DVT prophylaxis: GI prophylaxis: Glucose control:  Mobility: bedrest  Code Status: FULL  Family Communication: husband  Disposition:   Labs   CBC: Recent Labs  Lab 06/23/18 0109 06/24/18 0602 06/25/18 0611 06/26/18 0525  WBC 3.6* 3.8* 6.5 5.0  NEUTROABS  --  3.0 5.8 4.6  HGB 9.6* 10.0* 9.7* 9.5*  HCT 30.1* 32.6* 31.5* 31.1*  MCV 92.9 92.9 94.3 96.3  PLT 79* 77* 73* 67*   CBC Latest Ref Rng & Units Jul 10, 2018 06/26/2018 06/25/2018  WBC 4.0 - 10.5 K/uL 0.2(LL) 5.0 6.5  Hemoglobin 12.0 - 15.0 g/dL 8.7(L) 9.5(L) 9.7(L)  Hematocrit 36.0 - 46.0 % 29.7(L) 31.1(L) 31.5(L)  Platelets 150 - 400 K/uL 33(L) 67(L) 73(L)     BMP Latest Ref Rng & Units 2018/07/10 06/26/2018 06/25/2018  Glucose 70 - 99 mg/dL 95 131(H) 153(H)  BUN 8 - 23 mg/dL 39(H) 31(H) 22  Creatinine 0.44 - 1.00 mg/dL 1.38(H) 1.31(H) 1.23(H)  Sodium 135 - 145 mmol/L 137 134(L) 134(L)  Potassium 3.5 - 5.1 mmol/L 3.4(L) 4.3 4.0  Chloride 98 - 111 mmol/L 122(H) 117(H) 116(H)  CO2 22 - 32 mmol/L 13(L) 15(L) 16(L)  Calcium 8.9 - 10.3 mg/dL 7.8(L) 8.4(L) 8.9    Basic Metabolic Panel: Recent Labs  Lab 06/28/2018 1206 06/23/18 0109 06/24/18 0602 06/25/18 0611 06/26/18 0525  NA 132* 135 133* 134* 134*  K 3.9 4.1 4.3 4.0 4.3  CL 107 112* 112* 116* 117*  CO2 22 19* 18* 16* 15*  GLUCOSE 96 73 176* 153* 131*  BUN _0 31*  CREATININE 1.13* 1.16* 1.07* 1.23* 1.31*  CALCIUM 11.2* 10.6* 9.6 8.9 8.4*   GFR: Estimated Creatinine Clearance: 44.1 mL/min (A) (by C-G formula based on SCr of 1.31 mg/dL (H)). Recent Labs  Lab 06/23/18 0109 06/24/18 0602 06/25/18 0611 06/26/18 0525  WBC 3.6* 3.8* 6.5 5.0     Liver Function Tests: Recent Labs  Lab 06/18/2018 1206 06/23/18 0109 06/24/18 0602 06/25/18 0611 06/26/18 0525  AST 50* 60* 81* 94* 133*  ALT 14 21 45* 57* 56*  ALKPHOS 28* 31* 27* 29* 28*  BILITOT 0.4 0.9 0.6 0.7 0.4  PROT 11.5* 11.7* 11.3* 10.6* 10.1*  ALBUMIN 3.0* 2.6* 2.3* 2.3* 2.2*   No results for input(s): LIPASE, AMYLASE in the last 168 hours. No results for input(s): AMMONIA in the last 168 hours.  ABG No results found for: PHART, PCO2ART, PO2ART, HCO3, TCO2, ACIDBASEDEF, O2SAT   Coagulation Profile: No results for input(s): INR, PROTIME in the last 168 hours.  Cardiac Enzymes: No results for input(s): CKTOTAL, CKMB, CKMBINDEX, TROPONINI in the last 168 hours.  HbA1C: No results found for: HGBA1C  CBG: No results for input(s): GLUCAP in the last 168 hours.  Review of Systems:   Could not be obtained due to altered mental status   Past Medical History  She,  has a past medical history of Anemia, Arthritis, Arthropathy, unspecified, site unspecified, Complication of anesthesia, Constipation, Endometriosis, Family history of adverse reaction to anesthesia, Fibroid, GERD (gastroesophageal reflux disease), Heart murmur, IBS (irritable bowel syndrome), Iron deficiency anemia due to chronic blood loss (08/15/2017), Malabsorption of iron (08/15/2017), MGUS (monoclonal gammopathy of unknown significance) (13-Sep-202013), Multiple myeloma ([place]) (02/18/2014), Myelitis ([place]) (2011), Neuromuscular disorder ([place]), Polymyalgia rheumatica ([place]), and PONV (postoperative nausea and vomiting).   Surgical History    Past Surgical History:  Procedure Laterality Date  . COLONOSCOPY    . Fistula repair surg    . IR GENERIC HISTORICAL  04/14/2016   IR FLUORO GUIDE PORT INSERTION RIGHT 04/14/2016 Greggory Keen, MD [facility]  . IR GENERIC HISTORICAL  04/14/2016   IR US GUIDE VASC ACCESS RIGHT 04/14/2016 Greggory Keen, MD [facility]  . KNEE ARTHROSCOPY Left   . NECK SURGERY  2011    Myletis- bone grafting- from her left hip  . RADIOLOGY WITH ANESTHESIA N/A 08/23/2014   Procedure: T12  ABLATION       (RADIOLOGY WITH ANESTHESIA);  Surgeon: Luanne Bras, MD;  Location: [facility];  Service: Radiology;  Laterality: N/A;  . TUBAL LIGATION    . UMBILICAL HERNIA REPAIR     age 1     Social History   reports that she quit smoking about 24 years ago. Her smoking use included cigarettes. She started smoking about 30 years ago. She has a 125.00 pack-year smoking history. She has never used smokeless tobacco. She reports that she does not drink alcohol or use drugs.   Family History   Her family history includes Diabetes in her maternal grandmother; Heart disease in her maternal grandmother, mother, and paternal grandmother; Hypertension in her maternal grandmother; Lung cancer in her maternal uncle; Rheum arthritis in her mother; Throat cancer in her maternal aunt. There is no history of Colon cancer, Esophageal cancer, Stomach cancer, or Rectal cancer.   Allergies Allergies  Allergen Reactions  . Barium-Containing Compounds Diarrhea    Oral Redicat gives pt severe diarrhea, causing delay in scan time, see previous ct scan notes for details  . Codeine Palpitations     Home Medications  Prior to Admission medications   Medication Sig Start Date End Date Taking? Authorizing Provider  acetaminophen (TYLENOL) 500 MG tablet Take 500 mg by mouth every 6 (six) hours as needed for mild pain or headache.   Yes [provider]  hyoscyamine (LEVSIN SL) 0.125 MG SL tablet PLACE 1 TABLET (0.125 MG TOTAL) UNDER THE TONGUE EVERY 4 (FOUR) HOURS AS NEEDED. Patient taking differently: Place 0.125 mg under the tongue every 4 (four) hours as needed (IBS).  08/29/17  Yes Pyrtle, Lajuan Lines, MD  montelukast (SINGULAIR) 10 MG tablet Take 10 mg by mouth at bedtime.   Yes [provider]  pantoprazole (PROTONIX) 40 MG tablet Take 1 tablet (40 mg total) by mouth daily. 03/20/18  Yes Zehr,  Laban Emperor, PA-C  torsemide (DEMADEX) 20 MG tablet Take 1 tablet (20 mg total) by mouth daily as needed. Patient taking differently: Take 20 mg by mouth daily as needed (fluid in ankles/swelling).  06/03/14  Yes Ennever, Rudell Cobb, MD  traMADol (ULTRAM) 50 MG tablet Take 1 tablet (50 mg total) by mouth every 6 (six) hours as needed. 06/19/18  Yes [name], MD  Vitamin D, Ergocalciferol, (DRISDOL) 50000 units CAPS capsule TAKE 1 CAPSULE (50,000 UNITS TOTAL) BY MOUTH ONCE A WEEK. 03/03/18  Yes [place], Holli Humbles,  NP  acyclovir (ZOVIRAX) 400 MG tablet Take 1 tablet (400 mg total) by mouth daily. 04/11/17   [name], MD  Doxepin HCl (SILENOR) 3 MG TABS Take 1 tablet (3 mg total) by mouth at bedtime as needed. Patient not taking: Reported on 06/28/2018 06/19/18   [name], MD  HYDROcodone-acetaminophen (NORCO/VICODIN) 5-325 MG tablet Take 1 tablet by mouth every 4 (four) hours as needed. Patient not taking: Reported on 06/24/2018 03/22/18   Isla Pence, MD  magnesium oxide (MAG-OX) 400 (241.3 Mg) MG tablet TAKE 1 TABLET BY MOUTH TWICE A DAY Patient taking differently: Take 400 mg by mouth daily.  06/22/16   [name], MD  Multiple Vitamin (MULTIVITAMIN) tablet Take 1 tablet by mouth every evening.     [provider]  ondansetron (ZOFRAN) 8 MG tablet TAKE 1 TABLET BY MOUTH TWICE A DAY FOR NAUSEA AND VOMITING 1 TABLET 1 HOUR PRIOR TO CHEMOTHERAPY Patient taking differently: Take 8 mg by mouth 2 (two) times daily as needed for nausea or vomiting (chemotherapy).  08/15/17   [name], MD  polyethylene glycol powder [facility]) powder 1 capful daily as needed Patient not taking: Reported on 06/10/2018 06/05/15   Mauri Pole, MD  prochlorperazine (COMPAZINE) 10 MG tablet Take 1 tablet (10 mg total) by mouth every 6 (six) hours as needed for nausea or vomiting. Patient not taking: Reported on 06/21/2018 04/11/17   [name], MD  Pyridoxine HCl (B-6)  250 MG TABS TAKE 1 TABLET (250 MG TOTAL) BY MOUTH DAILY. Patient taking differently: Take 250 mg by mouth daily.  08/15/17   [name], MD

## 2018-07-09 DEATH — deceased

## 2018-07-10 ENCOUNTER — Other Ambulatory Visit: Payer: BLUE CROSS/BLUE SHIELD

## 2018-07-10 ENCOUNTER — Ambulatory Visit: Payer: BLUE CROSS/BLUE SHIELD | Admitting: Hematology & Oncology

## 2018-07-10 ENCOUNTER — Ambulatory Visit: Payer: BLUE CROSS/BLUE SHIELD

## 2019-06-30 IMAGING — DX DG CHEST 2V
2 series · 2 of 2 positions shown · non-contrast
Comparison: Chest radiographs dated 03/22/2018 and chest CTA dated
03/22/2018.

CLINICAL DATA: Multiple myeloma in relapse. New right anterior rib
pain. Ex-smoker.

EXAM:
CHEST - 2 VIEW

[chest pa]
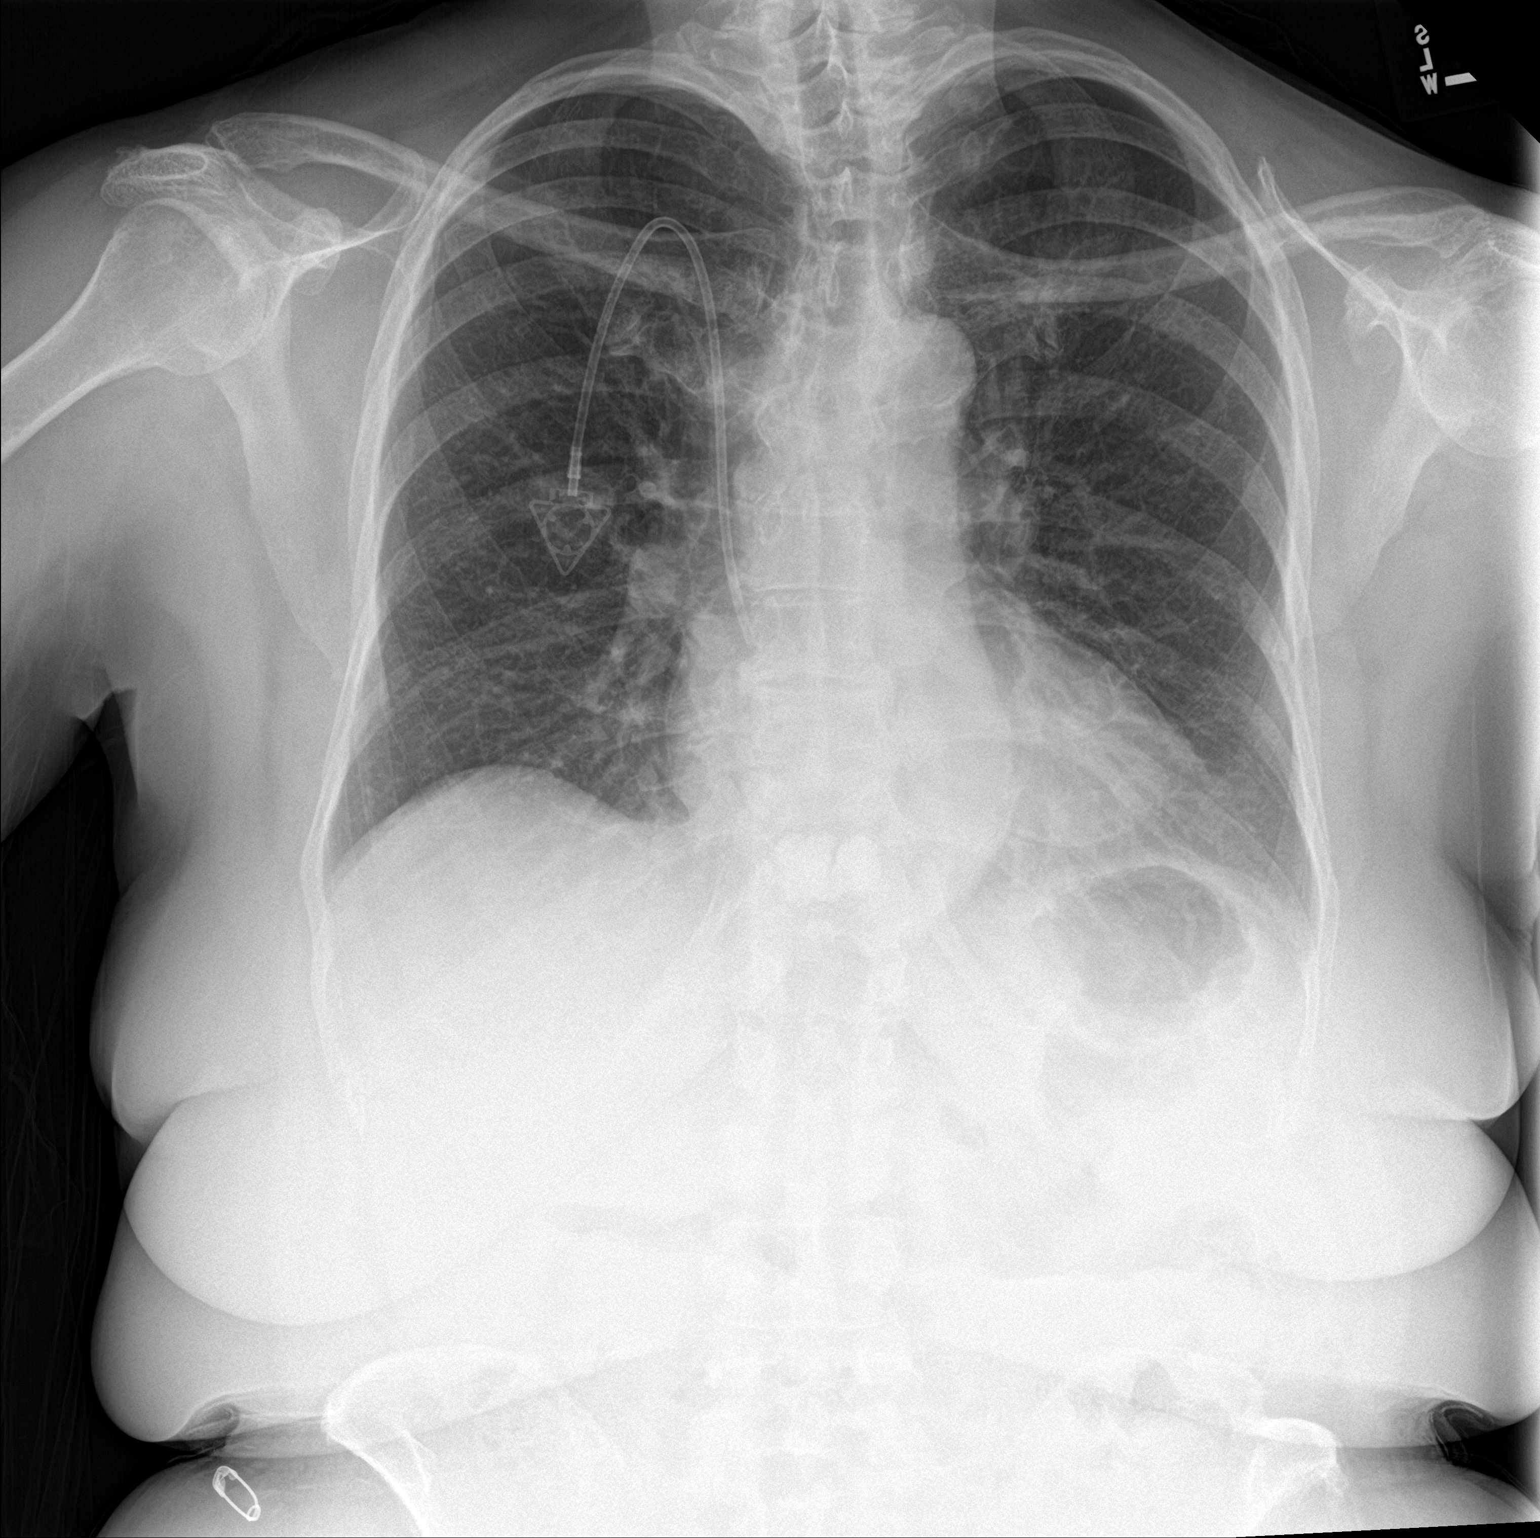

[chest lat]
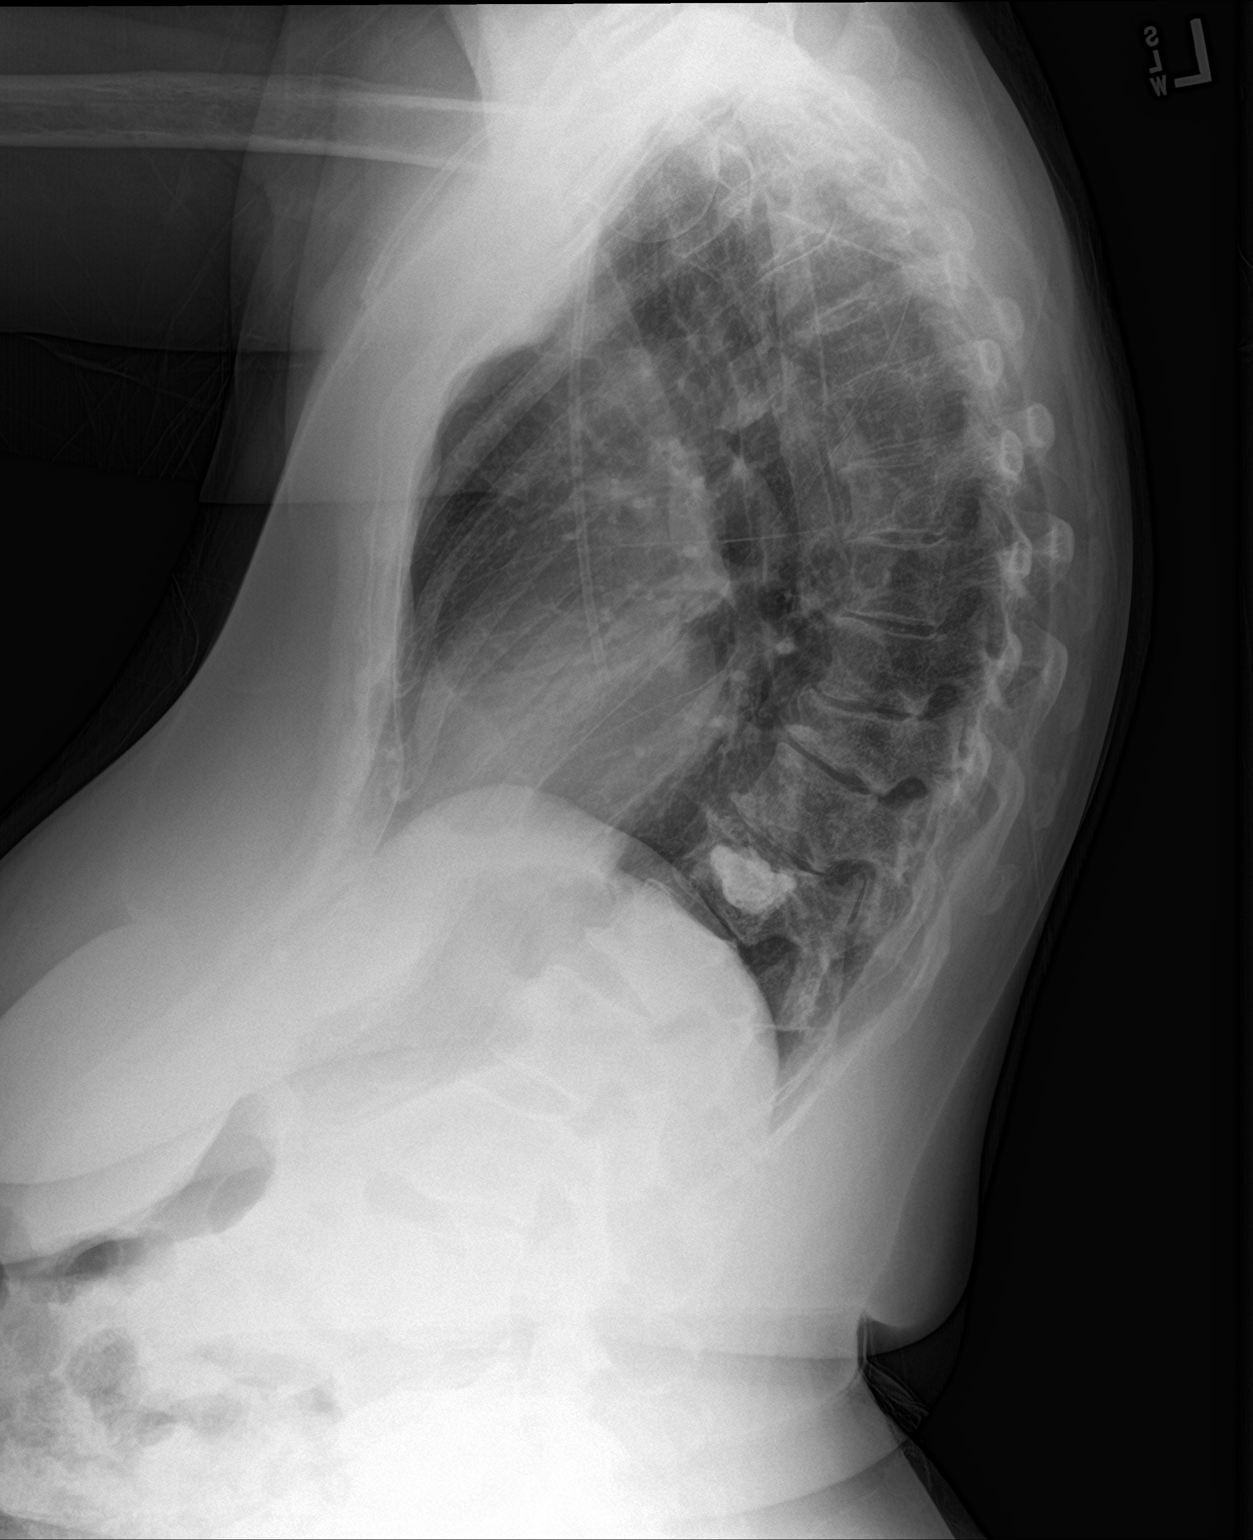

[2 of 2 positions shown; findings below may reference images not displayed]

FINDINGS: Normal sized heart. Tortuous and partially calcified thoracic aorta.
Right jugular porta catheter tip at the superior cavoatrial
junction. Clear lungs with mildly prominent interstitial markings
and normal vascularity. Thoracic spine degenerative changes, T12
kyphoplasty and T10 compression deformity without significant
change.
IMPRESSION: No acute findings. Stable mild chronic interstitial lung disease.

## 2019-08-18 IMAGING — CT CT HEAD CODE STROKE
3 series · 16 of 46 positions shown, 19 images · non-contrast
Comparison: Brain MRI 02/29/2008.

CLINICAL DATA: Code stroke. 62-year-old female with confusion,
slurred speech and altered mental status. Last seen normal 0388
hours.

Multiple myeloma.
EXAM:
CT HEAD WITHOUT CONTRAST
TECHNIQUE: Contiguous axial images were obtained from the base of the skull
through the vertex without intravenous contrast.

[Series 2: head wo · axial · 0.41mm/px · z∈[+1504,+1624]mm · 10 of 29 slices shown, 13 images]
[im 3/29  brain]
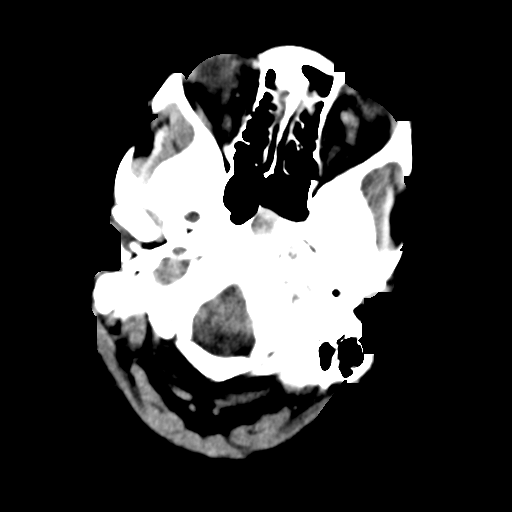
[im 3/29  bone]
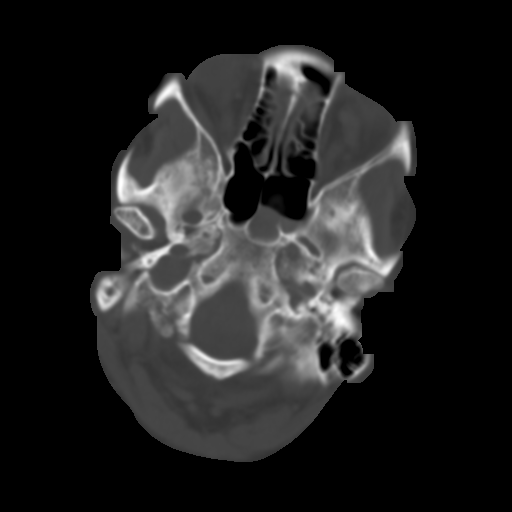
[im 6/29  brain]
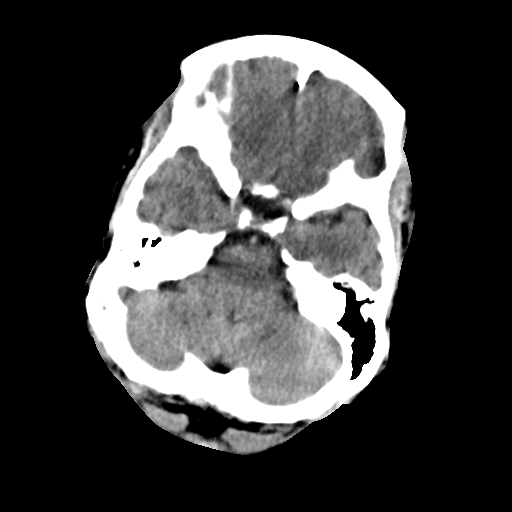
[im 8/29  brain]
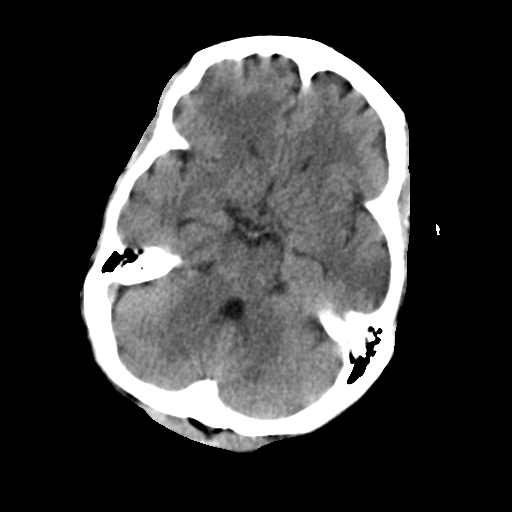
[im 11/29  brain]
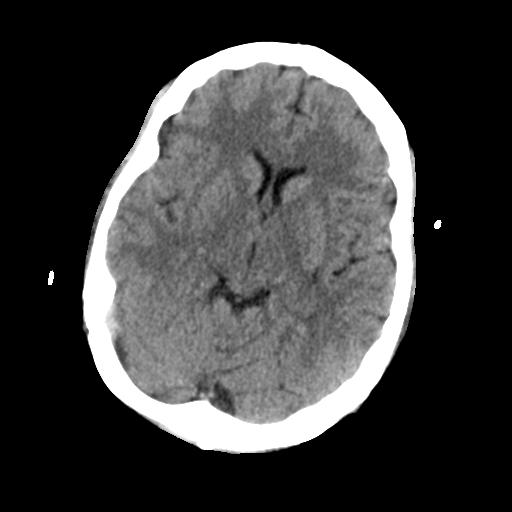
[im 14/29  brain]
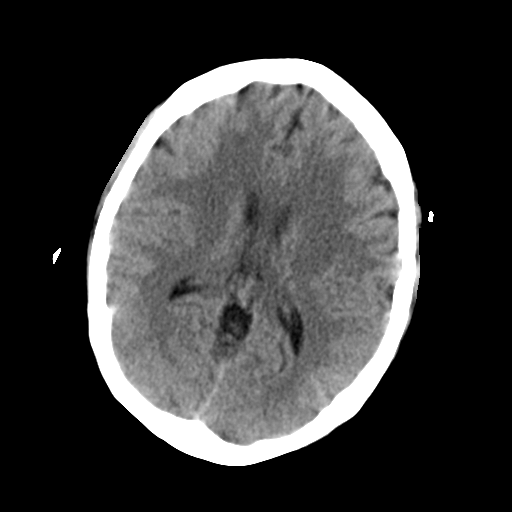
[im 14/29  bone]
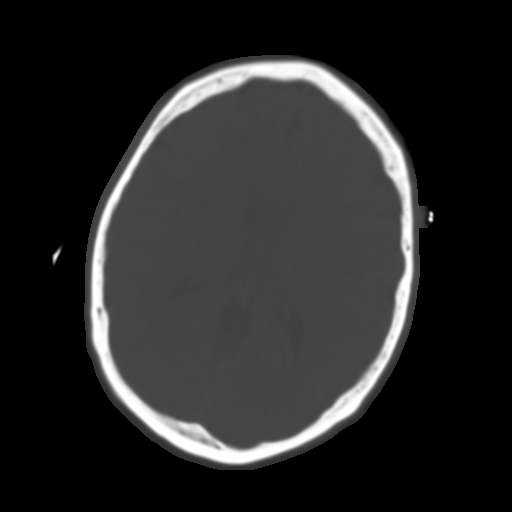
[im 16/29  brain]
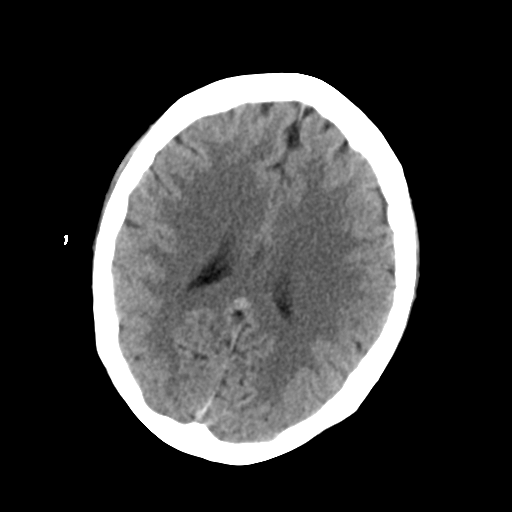
[im 19/29  brain]
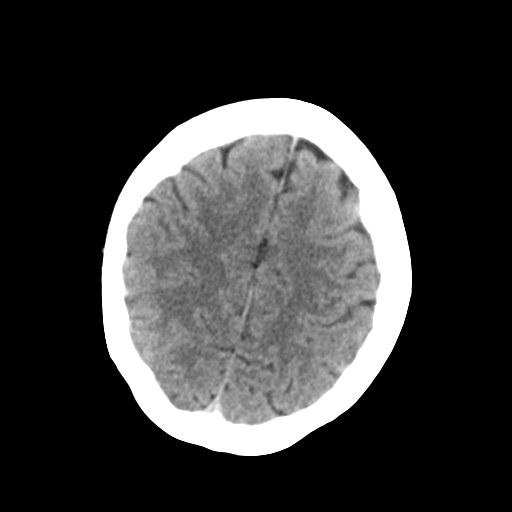
[im 22/29  brain]
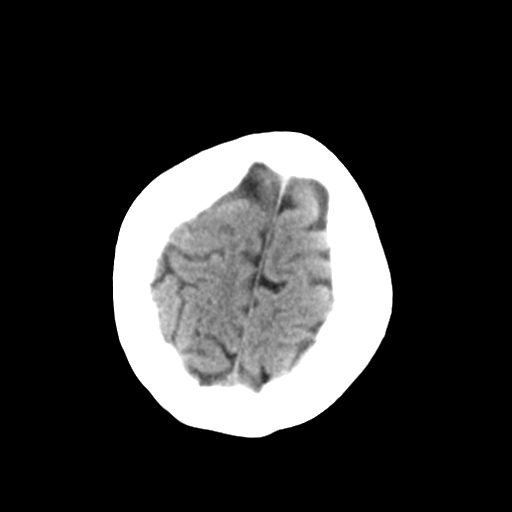
[im 24/29  brain]
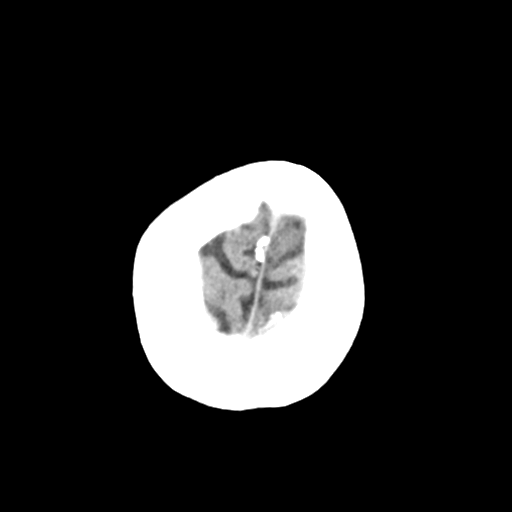
[im 24/29  bone]
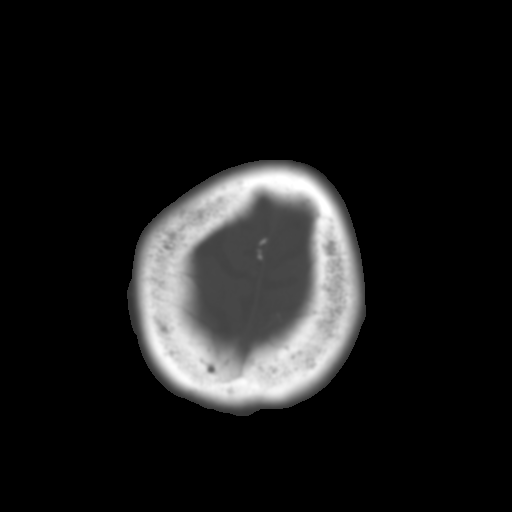
[im 27/29  brain]
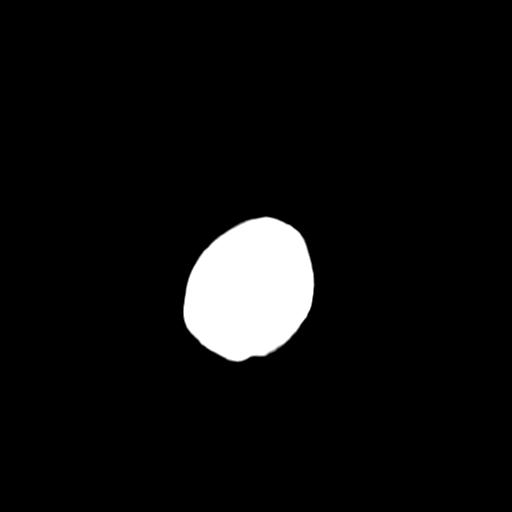

[Series 4: coronal soft tissue · coronal · 0.30mm/px · 3 of 59 slices shown]
[im 20/59  brain]
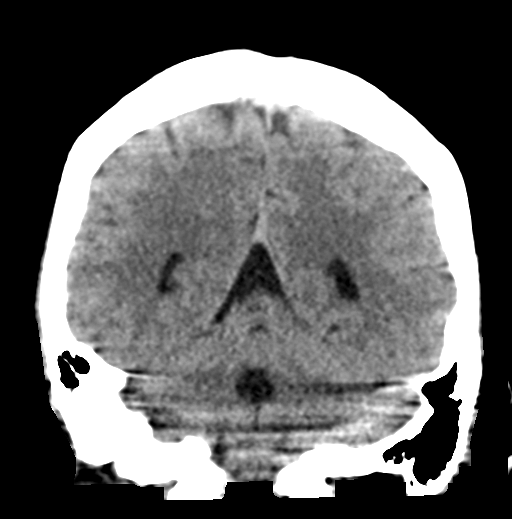
[im 26/59  brain]
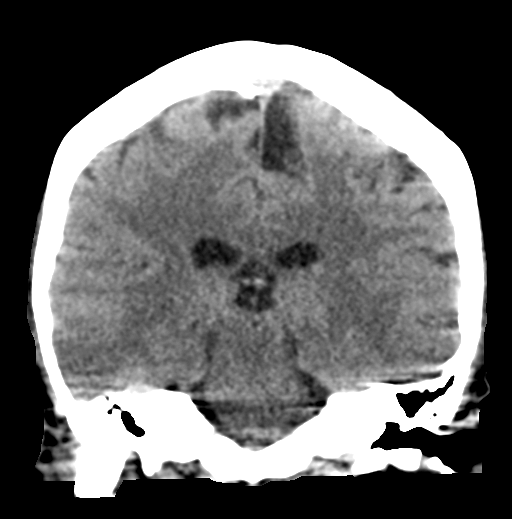
[im 33/59  brain]
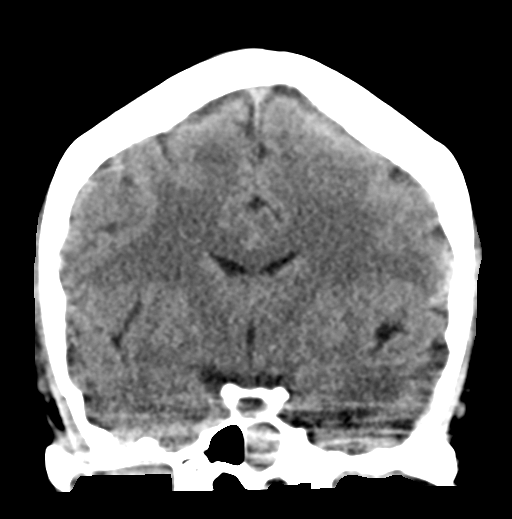

[Series 5: sagittal soft tissue · sagittal · 0.30mm/px · 3 of 51 slices shown]
[im 17/51  brain]
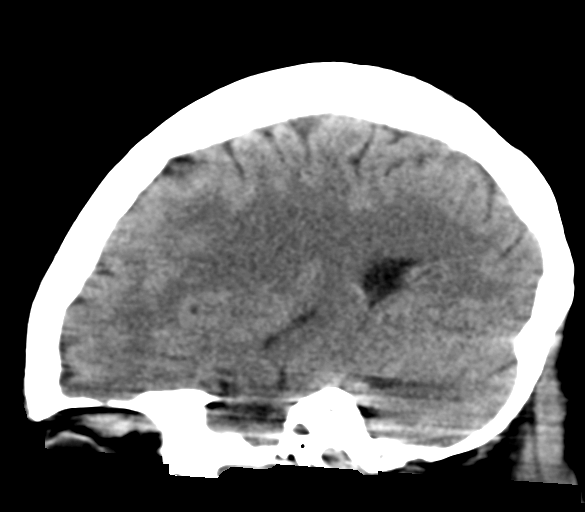
[im 26/51  brain]
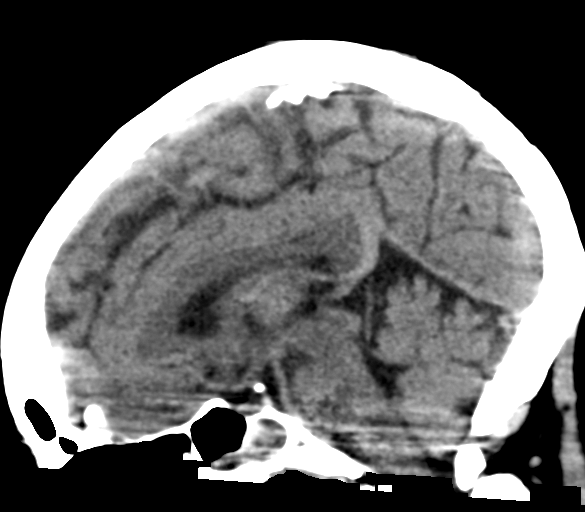
[im 34/51  brain]
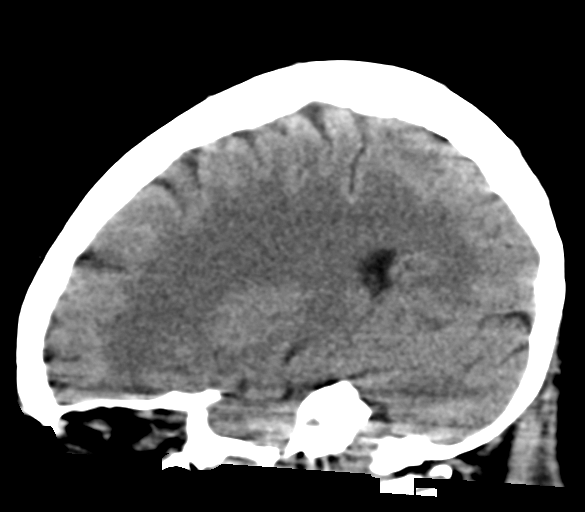

[16 of 46 positions shown; findings below may reference images not displayed]

FINDINGS: Brain: Cerebral volume remains normal.

Mild streak artifact in the middle cranial fossa. Gray-white matter
differentiation appears normal for age throughout the brain.

No midline shift, ventriculomegaly, mass effect, evidence of mass
lesion, intracranial hemorrhage or evidence of cortically based
acute infarction.

Vascular: There is mild asymmetric hyperdensity of the distal right
MCA M1 (series 5, image 15 and series 2, image 7).

Skull: Minimal heterogeneity of skull mineralization. No acute
osseous abnormality identified.

Sinuses/Orbits: Moderate new mucosal thickening or retention cyst in
the left sphenoid sinus since 2557. New right mastoid effusion.
Other visible paranasal sinuses and left mastoids are clear.

Other: No acute orbit or scalp soft tissue finding.

ASPECTS ([place] Stroke Program Early CT Score)

- Ganglionic level infarction (caudate, lentiform nuclei, internal
capsule, insula, M1-M3 cortex): 7

- Supraganglionic infarction (M4-M6 cortex): 3

Total score (0-10 with 10 being normal): 10
IMPRESSION: 1. Normal for age noncontrast CT appearance of the brain except for
questionable abnormal hyperdense right MCA. Query left side
weakness.
2. ASPECTS is 10.
3. Study discussed by telephone with Dr. Daruosh on 06/26/2018 at
